# Patient Record
Sex: Male | Born: 1950 | Race: White | Hispanic: No | Marital: Married | Smoking: Former smoker
Health system: Southern US, Community
[De-identification: ages and names within clinical notes are randomized; demographics above are authoritative.]

## PROBLEM LIST (undated history)

## (undated) DIAGNOSIS — T7840XA Allergy, unspecified, initial encounter: Secondary | ICD-10-CM

## (undated) DIAGNOSIS — I219 Acute myocardial infarction, unspecified: Secondary | ICD-10-CM

## (undated) DIAGNOSIS — I10 Essential (primary) hypertension: Secondary | ICD-10-CM

## (undated) DIAGNOSIS — B019 Varicella without complication: Secondary | ICD-10-CM

## (undated) DIAGNOSIS — F329 Major depressive disorder, single episode, unspecified: Secondary | ICD-10-CM

## (undated) DIAGNOSIS — S060X0A Concussion without loss of consciousness, initial encounter: Secondary | ICD-10-CM

## (undated) DIAGNOSIS — E876 Hypokalemia: Secondary | ICD-10-CM

## (undated) DIAGNOSIS — Z8639 Personal history of other endocrine, nutritional and metabolic disease: Secondary | ICD-10-CM

## (undated) DIAGNOSIS — F32A Depression, unspecified: Secondary | ICD-10-CM

## (undated) DIAGNOSIS — E119 Type 2 diabetes mellitus without complications: Secondary | ICD-10-CM

## (undated) DIAGNOSIS — K219 Gastro-esophageal reflux disease without esophagitis: Secondary | ICD-10-CM

## (undated) DIAGNOSIS — K5190 Ulcerative colitis, unspecified, without complications: Secondary | ICD-10-CM

## (undated) DIAGNOSIS — E785 Hyperlipidemia, unspecified: Secondary | ICD-10-CM

## (undated) DIAGNOSIS — I509 Heart failure, unspecified: Secondary | ICD-10-CM

## (undated) DIAGNOSIS — D126 Benign neoplasm of colon, unspecified: Secondary | ICD-10-CM

## (undated) DIAGNOSIS — F419 Anxiety disorder, unspecified: Secondary | ICD-10-CM

## (undated) DIAGNOSIS — F509 Eating disorder, unspecified: Secondary | ICD-10-CM

## (undated) DIAGNOSIS — G510 Bell's palsy: Secondary | ICD-10-CM

## (undated) DIAGNOSIS — R413 Other amnesia: Secondary | ICD-10-CM

## (undated) HISTORY — DX: Acute myocardial infarction, unspecified: I21.9

## (undated) HISTORY — DX: Hypokalemia: E87.6

## (undated) HISTORY — DX: Benign neoplasm of colon, unspecified: D12.6

## (undated) HISTORY — PX: UPPER GASTROINTESTINAL ENDOSCOPY: SHX188

## (undated) HISTORY — DX: Varicella without complication: B01.9

## (undated) HISTORY — DX: Gastro-esophageal reflux disease without esophagitis: K21.9

## (undated) HISTORY — PX: SHOULDER SURGERY: SHX246

## (undated) HISTORY — DX: Hyperlipidemia, unspecified: E78.5

## (undated) HISTORY — DX: Allergy, unspecified, initial encounter: T78.40XA

## (undated) HISTORY — DX: Anxiety disorder, unspecified: F41.9

## (undated) HISTORY — DX: Major depressive disorder, single episode, unspecified: F32.9

## (undated) HISTORY — DX: Type 2 diabetes mellitus without complications: E11.9

## (undated) HISTORY — DX: Personal history of other endocrine, nutritional and metabolic disease: Z86.39

## (undated) HISTORY — DX: Other amnesia: R41.3

## (undated) HISTORY — DX: Bell's palsy: G51.0

## (undated) HISTORY — DX: Eating disorder, unspecified: F50.9

## (undated) HISTORY — DX: Depression, unspecified: F32.A

## (undated) HISTORY — DX: Essential (primary) hypertension: I10

## (undated) HISTORY — PX: TONSILLECTOMY: SUR1361

---
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
## 2000-09-27 ENCOUNTER — Other Ambulatory Visit: Admission: RE | Admit: 2000-09-27 | Discharge: 2000-09-27 | Payer: Self-pay | Admitting: Family Medicine

## 2004-03-31 ENCOUNTER — Ambulatory Visit (HOSPITAL_COMMUNITY): Admission: RE | Admit: 2004-03-31 | Discharge: 2004-03-31 | Payer: Self-pay | Admitting: Orthopedic Surgery

## 2004-11-21 ENCOUNTER — Encounter: Admission: RE | Admit: 2004-11-21 | Discharge: 2004-11-21 | Payer: Self-pay | Admitting: Family Medicine

## 2005-06-07 ENCOUNTER — Encounter: Admission: RE | Admit: 2005-06-07 | Discharge: 2005-06-07 | Payer: Self-pay | Admitting: Family Medicine

## 2005-11-26 ENCOUNTER — Encounter: Admission: RE | Admit: 2005-11-26 | Discharge: 2005-11-26 | Payer: Self-pay | Admitting: Family Medicine

## 2005-12-03 ENCOUNTER — Encounter: Admission: RE | Admit: 2005-12-03 | Discharge: 2005-12-03 | Payer: Self-pay | Admitting: Neurology

## 2006-05-06 ENCOUNTER — Inpatient Hospital Stay (HOSPITAL_COMMUNITY): Admission: AD | Admit: 2006-05-06 | Discharge: 2006-05-10 | Payer: Self-pay | Admitting: Gastroenterology

## 2006-05-08 ENCOUNTER — Encounter (INDEPENDENT_AMBULATORY_CARE_PROVIDER_SITE_OTHER): Payer: Self-pay | Admitting: *Deleted

## 2006-05-23 ENCOUNTER — Encounter (HOSPITAL_COMMUNITY): Admission: RE | Admit: 2006-05-23 | Discharge: 2006-08-21 | Payer: Self-pay | Admitting: Gastroenterology

## 2008-02-10 ENCOUNTER — Encounter (INDEPENDENT_AMBULATORY_CARE_PROVIDER_SITE_OTHER): Payer: Self-pay | Admitting: Gastroenterology

## 2008-02-10 ENCOUNTER — Ambulatory Visit (HOSPITAL_COMMUNITY): Admission: RE | Admit: 2008-02-10 | Discharge: 2008-02-10 | Payer: Self-pay | Admitting: Gastroenterology

## 2011-01-23 NOTE — Op Note (Signed)
[name], [name] NO.:  [id number]   MEDICAL RECORD NO.:  [id number]          PATIENT TYPE:  AMB   LOCATION:  ENDO                         FACILITY:  [facility]   PHYSICIAN:  Earle Gell, M.D.   DATE OF BIRTH:  10-16-1950   DATE OF PROCEDURE:  02/10/2008  DATE OF DISCHARGE:                               OPERATIVE REPORT   PROCEDURE INDICATION:  Mr. [name] is a 60 year old male born on  [birth year].  In 1983, Mr. [name] underwent a colonoscopy performed in  [place], [place] and was diagnosed with ulcerative colitis.   In August 2007, Mr. [name] was hospitalized at [facility] with a  flare in his universal ulcerative proctocolitis.  Unprepped  proctocolonoscopy to the proximal ascending colon revealed universal  ulcerative proctocolitis with relative sparing of the ascending colon  and cecum.   Mr. [name] is symptomatically in remission on azathioprine.   MEDICATION ALLERGIES:  None.   PAST MEDICAL HISTORY:  1. Hypertension.  2. Hypercholesterolemia.  3. Shoulder surgery.  4. Universal ulcerative proctocolitis diagnosed in 1983.  5. Bell palsy on 2 occasion, resolved sixth cranial nerve palsy.   HABITS:  Mr. [name] does not use tobacco products.  He consumes [employer] sparingly.   SOCIAL HISTORY:  Mr. [name] is married.  His wife and 2 sons are in good  health.   FAMILY HISTORY:  Negative for colon cancer.  His father died at age 41  in a boating accident.   ENDOSCOPIST:  Earle Gell, MD   PREMEDICATION:  Fentanyl 75 mcg and Versed 7 mg.   PROCEDURE:  After obtaining informed consent, Mr. [name] was placed in the  left lateral decubitus position.  I administered intravenous fentanyl  and intravenous Versed to achieve conscious sedation for the procedure.  The patient's blood pressure, oxygen saturation, and cardiac rhythm were  monitored throughout the procedure and documented in the medical record.   Anal inspection and  digital rectal exam were normal.  The Pentax  pediatric colonoscope was introduced into the rectum and easily advanced  to the cecum as identified by a normal-appearing ileocecal valve and  appendiceal orifice.  Colonic preparation for the exam today was  excellent.   Rectum normal.   Sigmoid colon and descending colon normal except for a few scattered  diminutive pseudopolyps.   Splenic flexure normal.   Transverse colon normal except for scattered diminutive pseudopolyps.   Hepatic flexure normal.   Ascending colon normal.   Cecum and ileocecal valve normal.   ASSESSMENT:  Universal ulcerative proctocolitis in remission on  azathioprine.  Thirty two biopsies were performed along the length of  the colon to rule out mucosal dysplasia.           ______________________________  Earle Gell, M.D.     MJ/MEDQ  D:  02/10/2008  T:  02/10/2008  Job:  [id number]   cc:   [name], M.D.

## 2011-01-26 NOTE — Discharge Summary (Signed)
[name], [name] NO.:  [id number]   MEDICAL RECORD NO.:  [id number]          PATIENT TYPE:  INP   LOCATION:  8676                         FACILITY:  [facility]   PHYSICIAN:  Earle Gell, M.D.   DATE OF BIRTH:  1951/06/11   DATE OF ADMISSION:  05/06/2006  DATE OF DISCHARGE:  05/10/2006                                 DISCHARGE SUMMARY   DISCHARGE DIAGNOSIS:  Indeterminate universal proctocolitis.   DISCHARGE MEDICATIONS:  1. Lovastatin 40 mg daily.  2. Aspirin 81 mg daily.  3. Multivitamin daily.  4. Potassium daily as prescribed.  5. Glucosamine daily p.r.n.  6. Fluoxetine 20 mg daily.  7. Prednisone 40 mg each morning.   I did not restart his antihypertensive therapy and I have not restarted his  Pentasa.   OFFICE FOLLOWUP:  I have asked [name] to make an appointment to see me in  1 week.   HOSPITAL COURSE:  [name] is a 60 year old male born 1950/09/30.  [name] was hospitalized at [facility] May 06, 2006, to  evaluate a 2-week history of nausea, vomiting and bloody diarrhea.   In 1983 he had a colonoscopy performed in [place], [place], and was told  he had ulcerative colitis.  He has not required chronic therapy for colitis  and has not required surgery for colitis.   [name] was admitted with hyponatremic, hypokalemic metabolic alkalosis  with profound volume contraction.  These metabolic abnormalities resolved  with vigorous intravenous saline infusion and potassium replacement.   Once his nausea and vomiting resolved he underwent an unprepped  proctocolonoscopy to the proximal ascending colon which revealed universal  ulcerative proctocolitis with relative sparing of the ascending colon and  what I could visualize of the cecum.  His C. difficile toxin screen was  negative.  Biopsies from the colon revealed acute and chronic colitis most  consistent with ulcerative colitis.  Endoscopic appearance of the colon  was  more consistent with Crohn's proctocolitis.   On admission [name] was placed on intravenous Solu-Medrol.  His gut  symptoms markedly improved.   His TB skin test was negative.  His chest x-ray was normal.  His hepatitis B  surface antigen was negative.  On May 10, 2006, he received intravenous  Remicade 500 mg per kilogram over 2 hours.  I evaluated [name] after a  little over an hour of infusion and he has had no side effects.   I did send blood to determine his thiopurine methyltransferase enzyme  activity and that value was not available at discharge.   [name] is tolerating a regular diet.  He is medically stable for  discharge.   I will assess his response to Remicade in 1 week.  I will plan to give him a  second infusion in 2 weeks and a third infusion in 8 weeks.   LABORATORY DATA:  White blood cell count 8600, hemoglobin 13.1 g.  Sedimentation rate 56 mm per hour.  Hyponatremic, hypokalemic metabolic  alkalosis resolved.  Basic metabolic profile normal at discharge.  Liver  enzymes, amylase and lipase normal.  Thyroid-stimulating hormone level and  free T4 normal.  Admission random serum cortisol was appropriately elevated  for stress.  Hepatitis B surface antigen negative.  C. difficile toxin  screen of the stool negative.  PA and lateral chest x-ray on admission  revealed low inspiratory lung volumes but no active disease.  Abdominal x-  ray on admission was consistent with left-sided colonic wall thickening  without toxic megacolon or intraperitoneal air.  TB skin test negative.  Hepatitis B surface antigen negative.           ______________________________  Earle Gell, M.D.     MJ/MEDQ  D:  05/10/2006  T:  05/11/2006  Job:  [id number]   cc:   Osvaldo Human, M.D.  Alyson Locket. Love, M.D.

## 2011-01-26 NOTE — H&P (Signed)
[name], [name] NO.:  [id number]   MEDICAL RECORD NO.:  [id number]          PATIENT TYPE:  INP   LOCATION:  4782                         FACILITY:  [facility]   PHYSICIAN:  Earle Gell, M.D.   DATE OF BIRTH:  [birth year]   DATE OF ADMISSION:  05/06/2006  DATE OF DISCHARGE:                                HISTORY & PHYSICAL   ADMISSION HISTORY AND PHYSICAL, FOLLOWED BY COLONOSCOPY:   ADMISSION DIAGNOSIS:  Nausea, vomiting and diarrhea for 2 weeks.   HISTORY:  [name] is a 60 year old male born Apr 03, 1951.  Mr.  Simien presented to my office with a 2-week history of nausea, vomiting and  bloody diarrhea associated with anorexia, a 15-pound weight loss,  generalized weakness and mild abdominal cramps.   Since March 2007, he has had two bouts of Bell's palsy.  The first episode  caused weakness on the right side of his face; the last episode weakened the  left side of his face, leaving him with residual paresis.  He has also  recovered from a sixth nerve palsy.  He has been evaluated by Alyson Locket. Love,  M.D., with a brain CT scan without contrast, brain MRI, and brain MRA.   In 45 [name] underwent a colonoscopy in [place], [place], and was  told he had colitis.  He has not been on medications for colitis and has not  required surgery for colitis.  His 2002 health maintenance flexible  proctosigmoidoscopy was reported to be normal.  His April 29, 2006, CBC and  liver profile were normal.   ALLERGIES:  None.   PAST MEDICAL HISTORY:  1. Hypertension.  2. Hypercholesterolemia.  3. Shoulder surgery.  4. Colitis by 1983 colonoscopy.  5. Bell palsy x2.  6. Resolved sixth cranial nerve palsy.   HABITS:  [name] does not use tobacco products.  He does consume 3 glasses  of Barnabas Lister Daniel's whiskey a month.   SOCIAL HISTORY:  Mr. Nile is married.  His 33 year old wife, 47 year old  son, and 19 year old son are in good health.   FAMILY  HISTORY:  Father died at age 13, boating accident.  Mother died at  age 72, Alzheimer's dementia.  Brother 18, in fair health.   CHRONIC MEDICATIONS:  1. Lovastatin 40 mg daily.  2. Atenolol with chlorthalidone 50/25 mg daily.  3. Fluoxetine 20 mg daily.  4. Aspirin 81 mg daily.  5. Potassium daily.  6. Glucosamine with chondroitin daily.  7. Multivitamin daily.  8. Meclizine 25 mg p.r.n.  9. Hyoscyamine sublingual 0.125 mg p.r.n.  10.Pentasa 500 mg q.i.d.   PHYSICAL EXAMINATION:  GENERAL APPEARANCE:  On presentation to my office Mr.  [name] appeared severely ill.  He was vomiting in a waiting room.  He could  hardly sit up and the exam was performed with the patient on his left side.  VITAL SIGNS:  Weight 249 pounds, blood pressure 132/60.  HEENT:  Nonicteric sclerae.  Extraocular movements are intact.  Very dry  oral mucosa.  LUNGS:  Clear to auscultation.  CARDIAC:  Regular  rhythm without murmurs.  ABDOMEN:  Soft and flat.  Bowel sounds are diminished.  He complains of  generalized abdominal muscular soreness secondary to retching.  SKIN:  Warm and dry.  I do not detect any rashes or any evidence to suggest  shingles.  EXTREMITIES:  No edema.  NEUROLOGIC:  There is residual left facial weakness.   ASSESSMENT:  1. Protracted nausea, vomiting and diarrhea leading to hypokalemic,      hyponatremic metabolic alkalosis with severe volume contraction.  2. Colitis by 1983 colonoscopy.  3. 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  4. Resolved sixth nerve palsy.  5. Recent prednisone use.   ENDOSCOPIST:  Earle Gell, M.D.   PREMEDICATION:  Fentanyl 75 mcg, Versed 7.5 mg.   PROCEDURE:  After obtaining informed consent, [name] was placed in the  left lateral decubitus position.  I administered intravenous fentanyl and  intravenous Versed to achieve conscious sedation for the procedure.  The  patient's blood pressure, oxygen saturation and cardiac rhythm were   monitored throughout the procedure and documented in the medical record.   Anal inspection and digital rectal exam were normal.  The Olympus adjustable  pediatric colonoscope was introduced into the rectum and advanced to the  distal ascending colon.  The ileocecal valve was seen in the distance.   [name] was not given a colonic lavage prep for this exam.  He did received  a tap water enema prior to undergoing the exam.  As a result, there was some  residual solid stool and liquid waste throughout the colon.  Despite this, I  was able to adequately visualize the colonic mucosa.   [name] has generalized very severe proctocolitis extending from the distal  rectum to the distal ascending colon.  Looking downstream at the ascending  colon and part of the cecum, the mucosa appeared relatively spared.   Throughout the mucosa there are deep ulcers with exudative bases.  The  mucosa is beefy red and friable.  There are no pseudomembranes to suggest C.  difficile colitis.  Biopsies are pending.   ASSESSMENT:  This looks like universal Crohn proctocolitis.           ______________________________  Earle Gell, M.D.     MJ/MEDQ  D:  05/08/2006  T:  05/08/2006  Job:  [id number]   cc:   Alyson Locket. Love, M.D.  Osvaldo Human, M.D.

## 2011-02-11 ENCOUNTER — Emergency Department (HOSPITAL_BASED_OUTPATIENT_CLINIC_OR_DEPARTMENT_OTHER)
Admission: EM | Admit: 2011-02-11 | Discharge: 2011-02-11 | Disposition: A | Discharge status: Home / Self Care | Payer: BC Managed Care – PPO | Attending: Emergency Medicine | Admitting: Emergency Medicine

## 2011-02-11 DIAGNOSIS — M545 Low back pain, unspecified: Secondary | ICD-10-CM | POA: Insufficient documentation

## 2012-02-25 ENCOUNTER — Emergency Department (HOSPITAL_BASED_OUTPATIENT_CLINIC_OR_DEPARTMENT_OTHER): Payer: BC Managed Care – PPO

## 2012-02-25 ENCOUNTER — Encounter (HOSPITAL_BASED_OUTPATIENT_CLINIC_OR_DEPARTMENT_OTHER): Payer: Self-pay | Admitting: Student

## 2012-02-25 ENCOUNTER — Emergency Department (HOSPITAL_BASED_OUTPATIENT_CLINIC_OR_DEPARTMENT_OTHER)
Admission: EM | Admit: 2012-02-25 | Discharge: 2012-02-25 | Disposition: A | Discharge status: Home / Self Care | Payer: BC Managed Care – PPO | Attending: Emergency Medicine | Admitting: Emergency Medicine

## 2012-02-25 DIAGNOSIS — W010XXA Fall on same level from slipping, tripping and stumbling without subsequent striking against object, initial encounter: Secondary | ICD-10-CM | POA: Insufficient documentation

## 2012-02-25 DIAGNOSIS — Y92009 Unspecified place in unspecified non-institutional (private) residence as the place of occurrence of the external cause: Secondary | ICD-10-CM | POA: Insufficient documentation

## 2012-02-25 DIAGNOSIS — Z87891 Personal history of nicotine dependence: Secondary | ICD-10-CM | POA: Insufficient documentation

## 2012-02-25 DIAGNOSIS — S82843A Displaced bimalleolar fracture of unspecified lower leg, initial encounter for closed fracture: Secondary | ICD-10-CM | POA: Insufficient documentation

## 2012-02-25 DIAGNOSIS — K5190 Ulcerative colitis, unspecified, without complications: Secondary | ICD-10-CM | POA: Insufficient documentation

## 2012-02-25 DIAGNOSIS — Y9301 Activity, walking, marching and hiking: Secondary | ICD-10-CM | POA: Insufficient documentation

## 2012-02-25 DIAGNOSIS — Y998 Other external cause status: Secondary | ICD-10-CM | POA: Insufficient documentation

## 2012-02-25 HISTORY — DX: Ulcerative colitis, unspecified, without complications: K51.90

## 2012-02-25 MED ORDER — OXYCODONE-ACETAMINOPHEN 5-325 MG PO TABS: 2.0000 | ORAL_TABLET | Freq: Four times a day (QID) | ORAL | Status: AC | PRN

## 2012-02-25 MED ORDER — OXYCODONE-ACETAMINOPHEN 5-325 MG PO TABS
2.0000 | ORAL_TABLET | Freq: Once | ORAL | Status: AC
  Administered 2012-02-25: 2 via ORAL
  Filled 2012-02-25: qty 2

## 2012-02-25 NOTE — ED Notes (Signed)
Pt in with injury to left ankle

## 2012-02-25 NOTE — ED Provider Notes (Signed)
History   This chart was scribed for [name] Johns, MD by Kathreen Cornfield. The patient was seen in room MH07/MH07 and the patient's care was started at 10:53 PM     CSN: [id number]  Arrival date & time 02/25/12  2133   First MD Initiated Contact with Patient 02/25/12 2233      Chief Complaint  Patient presents with  . Ankle Pain    slipping and twisting injury    (Consider location/radiation/quality/duration/timing/severity/associated sxs/prior treatment) HPI  [name] is a 61 y.o. male who presents to the Emergency Department complaining of moderate, episodic ankle pain located at the left ankle onset today with associated symptoms of numbness. The pt states he "slipped and fell while walking down a platform in his backyard." The pt states he "heard it pop about twice." Pt denies any other injury, denies neck or back pain.  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  Orthopedist is Dr. Veverly Fells in [place], [place].    Past Medical History  Diagnosis Date  . Ulcerative colitis     Past Surgical History  Procedure Date  . Shoulder surgery     LEFT      History  Substance Use Topics  . Smoking status: Former Research scientist (life sciences)  . Smokeless tobacco: Not on file  . Alcohol Use: Yes      Review of Systems  Constitutional: Negative for fever, chills, diaphoresis and fatigue.  HENT: Negative for congestion, rhinorrhea, sneezing and neck pain.   Eyes: Negative.   Respiratory: Negative for cough, chest tightness and shortness of breath.   Cardiovascular: Negative for chest pain and leg swelling.  Gastrointestinal: Negative for nausea, vomiting, abdominal pain, diarrhea and blood in stool.  Genitourinary: Negative for frequency, hematuria, flank pain and difficulty urinating.  Musculoskeletal: Negative for back pain and arthralgias.  Skin: Negative for rash.  Neurological: Negative for dizziness, speech difficulty, weakness, numbness and headaches.    Allergies  Review of  patient's allergies indicates no known allergies.  Home Medications   Current Outpatient Rx  Name Route Sig Dispense Refill  . OXYCODONE-ACETAMINOPHEN 5-325 MG PO TABS Oral Take 2 tablets by mouth every 6 (six) hours as needed for pain. 20 tablet 0    BP 158/78  Pulse 81  Temp 98.5 F (36.9 C) (Oral)  Resp 18  Ht 5' 7"  (1.702 m)  Wt 230 lb (104.327 kg)  BMI 36.02 kg/m2  SpO2 97%  Physical Exam  Nursing note and vitals reviewed. Constitutional: He is oriented to person, place, and time. He appears well-developed and well-nourished.  HENT:  Head: Normocephalic and atraumatic.  Nose: Nose normal.  Eyes: Pupils are equal, round, and reactive to light.  Neck: Normal range of motion. Neck supple.  Cardiovascular: Normal rate, regular rhythm and normal heart sounds.   Pulmonary/Chest: Effort normal and breath sounds normal.  Musculoskeletal: Normal range of motion. He exhibits no edema.       Moderate edema to the left ankle joint, positive tenderness along the bilateral malleoli, no pain to the proximal fibula, sensation intact, pulses intact. No pain along the neck or back.   Lymphadenopathy:    He has no cervical adenopathy.  Neurological: He is alert and oriented to person, place, and time.  Skin: Skin is warm and dry. No rash noted.  Psychiatric: He has a normal mood and affect.    ED Course  Procedures (including critical care time)  DIAGNOSTIC STUDIES: Oxygen Saturation is 97% on room  air, normal by my interpretation.    COORDINATION OF CARE:   10:55PM- EDP at bedside discusses treatment plan concerning x-ray results and follow up with Orthopedist.   Labs Reviewed - No data to display Dg Ankle Complete Left  02/25/2012  *RADIOLOGY REPORT*  Clinical Data: Twisting injury to the left ankle, status post fall. Left ankle pain.  LEFT ANKLE COMPLETE - 3+ VIEW  Comparison: Left lower leg radiographs performed 11/21/2004  Findings: There is a horizontal fracture through the  medial malleolus, and an oblique fracture through the distal fibula, both demonstrating mild displacement.  The interosseous space appears grossly intact.  No significant talar tilt or subluxation is seen, though both the medial and lateral malleolar fragments are laterally displaced.  Mild surrounding soft tissue swelling is noted.  The subtalar joint is unremarkable in appearance.  An os trigonum is seen.  A tiny plantar calcaneal spur is incidentally noted.  Visualized joint spaces are otherwise grossly intact.  IMPRESSION:  1.  Horizontal fracture through the medial malleolus, and oblique fracture through the distal fibula, both demonstrating mild lateral displacement.  The interosseous space appears grossly intact.  No significant talar tilt or subluxation seen. 2.  Os trigonum noted.  Original Report Authenticated By: Santa Lighter, M.D.     1. Ankle fracture, bimalleolar, closed       MDM  Pt placed in posterior splint, crutches, pain meds given.  Has ortho to f/u with.     I personally performed the services described in this documentation, which was scribed in my presence.  The recorded information has been reviewed and considered.      [name] Johns, MD 02/25/12 2312

## 2012-02-25 NOTE — Discharge Instructions (Signed)
Ankle Fracture A fracture is a break in the bone. A cast or splint is used to protect and keep your injured bone from moving.  HOME CARE INSTRUCTIONS   Use your crutches as directed.   To lessen the swelling, keep the injured leg elevated while sitting or lying down.   Apply ice to the injury for 15 to 20 minutes, 3 to 4 times per day while awake for 2 days. Put the ice in a plastic bag and place a thin towel between the bag of ice and your cast.   If you have a plaster or fiberglass cast:   Do not try to scratch the skin under the cast using sharp or pointed objects.   Check the skin around the cast every day. You may put lotion on any red or sore areas.   Keep your cast dry and clean.   If you have a plaster splint:   Wear the splint as directed.   You may loosen the elastic around the splint if your toes become numb, tingle, or turn cold or blue.   Do not put pressure on any part of your cast or splint; it may break. Rest your cast only on a pillow the first 24 hours until it is fully hardened.   Your cast or splint can be protected during bathing with a plastic bag. Do not lower the cast or splint into water.   Take medications as directed by your caregiver. Only take over-the-counter or prescription medicines for pain, discomfort, or fever as directed by your caregiver.   Do not drive a vehicle until your caregiver specifically tells you it is safe to do so.   If your caregiver has given you a follow-up appointment, it is very important to keep that appointment. Not keeping the appointment could result in a chronic or permanent injury, pain, and disability. If there is any problem keeping the appointment, you must call back to this facility for assistance.  SEEK IMMEDIATE MEDICAL CARE IF:   Your cast gets damaged or breaks.   You have continued severe pain or more swelling than you did before the cast was put on.   Your skin or toenails below the injury turn blue or gray,  or feel cold or numb.   There is a bad smell or new stains and/or purulent (pus like) drainage coming from under the cast.  If you do not have a window in your cast for observing the wound, a discharge or minor bleeding may show up as a stain on the outside of your cast. Report these findings to your caregiver. MAKE SURE YOU:   Understand these instructions.   Will watch your condition.   Will get help right away if you are not doing well or get worse.  Document Released: 08/24/2000 Document Revised: 08/16/2011 Document Reviewed: 03/30/2008 [facility] Patient Information 2012 [place].

## 2012-02-26 ENCOUNTER — Encounter (HOSPITAL_BASED_OUTPATIENT_CLINIC_OR_DEPARTMENT_OTHER): Payer: Self-pay | Admitting: *Deleted

## 2012-02-26 NOTE — Progress Notes (Signed)
No labs needed To arrive 1315

## 2012-02-27 ENCOUNTER — Ambulatory Visit (HOSPITAL_BASED_OUTPATIENT_CLINIC_OR_DEPARTMENT_OTHER)
Admission: RE | Admit: 2012-02-27 | Discharge: 2012-02-27 | Disposition: A | Discharge status: Home / Self Care | Payer: BC Managed Care – PPO | Source: Ambulatory Visit | Attending: Orthopedic Surgery | Admitting: Orthopedic Surgery

## 2012-02-27 ENCOUNTER — Encounter (HOSPITAL_BASED_OUTPATIENT_CLINIC_OR_DEPARTMENT_OTHER)
Admission: RE | Disposition: A | Discharge status: Home / Self Care | Payer: Self-pay | Source: Ambulatory Visit | Attending: Orthopedic Surgery

## 2012-02-27 ENCOUNTER — Encounter (HOSPITAL_BASED_OUTPATIENT_CLINIC_OR_DEPARTMENT_OTHER): Payer: Self-pay | Admitting: Certified Registered"

## 2012-02-27 ENCOUNTER — Ambulatory Visit (HOSPITAL_BASED_OUTPATIENT_CLINIC_OR_DEPARTMENT_OTHER): Payer: BC Managed Care – PPO | Admitting: Certified Registered"

## 2012-02-27 ENCOUNTER — Encounter (HOSPITAL_BASED_OUTPATIENT_CLINIC_OR_DEPARTMENT_OTHER): Payer: Self-pay

## 2012-02-27 DIAGNOSIS — S82843A Displaced bimalleolar fracture of unspecified lower leg, initial encounter for closed fracture: Secondary | ICD-10-CM | POA: Insufficient documentation

## 2012-02-27 DIAGNOSIS — M25579 Pain in unspecified ankle and joints of unspecified foot: Secondary | ICD-10-CM

## 2012-02-27 DIAGNOSIS — Y92009 Unspecified place in unspecified non-institutional (private) residence as the place of occurrence of the external cause: Secondary | ICD-10-CM | POA: Insufficient documentation

## 2012-02-27 DIAGNOSIS — W010XXA Fall on same level from slipping, tripping and stumbling without subsequent striking against object, initial encounter: Secondary | ICD-10-CM | POA: Insufficient documentation

## 2012-02-27 HISTORY — PX: ORIF ANKLE FRACTURE: SHX5408

## 2012-02-27 SURGERY — OPEN REDUCTION INTERNAL FIXATION (ORIF) ANKLE FRACTURE
Anesthesia: General | Site: Ankle | Laterality: Left | Wound class: Clean

## 2012-02-27 MED ORDER — ASPIRIN EC 325 MG PO TBEC: 325.0000 mg | DELAYED_RELEASE_TABLET | Freq: Two times a day (BID) | ORAL | Status: AC

## 2012-02-27 MED ORDER — MIDAZOLAM HCL 2 MG/2ML IJ SOLN
0.5000 mg | INTRAMUSCULAR | Status: DC | PRN
  Administered 2012-02-27: 2 mg via INTRAVENOUS

## 2012-02-27 MED ORDER — FENTANYL CITRATE 0.05 MG/ML IJ SOLN
50.0000 ug | INTRAMUSCULAR | Status: DC | PRN
  Administered 2012-02-27: 100 ug via INTRAVENOUS

## 2012-02-27 MED ORDER — BUPIVACAINE-EPINEPHRINE PF 0.5-1:200000 % IJ SOLN
INTRAMUSCULAR | Status: DC | PRN
  Administered 2012-02-27: 30 mL

## 2012-02-27 MED ORDER — VITAMIN C 500 MG PO TABS: 500.0000 mg | ORAL_TABLET | Freq: Every day | ORAL | Status: AC

## 2012-02-27 MED ORDER — EPHEDRINE SULFATE 50 MG/ML IJ SOLN
INTRAMUSCULAR | Status: DC | PRN
  Administered 2012-02-27: 10 mg via INTRAVENOUS

## 2012-02-27 MED ORDER — DEXAMETHASONE SODIUM PHOSPHATE 4 MG/ML IJ SOLN
INTRAMUSCULAR | Status: DC | PRN
  Administered 2012-02-27: 8 mg via INTRAVENOUS

## 2012-02-27 MED ORDER — CEFAZOLIN SODIUM-DEXTROSE 2-3 GM-% IV SOLR
2.0000 g | INTRAVENOUS | Status: AC
  Administered 2012-02-27: 2 g via INTRAVENOUS

## 2012-02-27 MED ORDER — CEFAZOLIN SODIUM 1-5 GM-% IV SOLN: 1.0000 g | INTRAVENOUS | Status: DC

## 2012-02-27 MED ORDER — PROPOFOL 10 MG/ML IV EMUL
INTRAVENOUS | Status: DC | PRN
  Administered 2012-02-27: 200 mg via INTRAVENOUS

## 2012-02-27 MED ORDER — LACTATED RINGERS IV SOLN
INTRAVENOUS | Status: DC
  Administered 2012-02-27 (×2): via INTRAVENOUS

## 2012-02-27 MED ORDER — ONDANSETRON HCL 4 MG/2ML IJ SOLN
INTRAMUSCULAR | Status: DC | PRN
  Administered 2012-02-27: 4 mg via INTRAVENOUS

## 2012-02-27 MED ORDER — BUPIVACAINE HCL (PF) 0.5 % IJ SOLN
INTRAMUSCULAR | Status: DC | PRN
  Administered 2012-02-27: 20 mL

## 2012-02-27 MED ORDER — CHLORHEXIDINE GLUCONATE 4 % EX LIQD: 60.0000 mL | Freq: Once | CUTANEOUS | Status: DC

## 2012-02-27 MED ORDER — METHOCARBAMOL 500 MG PO TABS: 500.0000 mg | ORAL_TABLET | Freq: Three times a day (TID) | ORAL | Status: AC

## 2012-02-27 MED ORDER — LIDOCAINE HCL (CARDIAC) 20 MG/ML IV SOLN
INTRAVENOUS | Status: DC | PRN
  Administered 2012-02-27: 40 mg via INTRAVENOUS

## 2012-02-27 MED ORDER — SODIUM CHLORIDE 0.9 % IV SOLN: INTRAVENOUS | Status: DC

## 2012-02-27 SURGICAL SUPPLY — 70 items
BANDAGE ELASTIC 4 VELCRO ST LF (GAUZE/BANDAGES/DRESSINGS) ×1 IMPLANT
BANDAGE ELASTIC 6 VELCRO ST LF (GAUZE/BANDAGES/DRESSINGS) ×2 IMPLANT
BIT DRILL SOLID 2.5X110 (BIT) ×1 IMPLANT
BIT DRILL SOLID 3.5X110 (BIT) ×1 IMPLANT
BLADE SURG 15 STRL LF DISP TIS (BLADE) ×4 IMPLANT
BLADE SURG 15 STRL SS (BLADE) ×8
BRUSH SCRUB EZ PLAIN DRY (MISCELLANEOUS) ×2 IMPLANT
BUR EGG 3PK/BX (BURR) IMPLANT
CLOTH BEACON ORANGE TIMEOUT ST (SAFETY) ×2 IMPLANT
COTTON STERILE ROLL (GAUZE/BANDAGES/DRESSINGS) ×2 IMPLANT
COVER TABLE BACK 60X90 (DRAPES) ×2 IMPLANT
CUFF TOURNIQUET SINGLE 34IN LL (TOURNIQUET CUFF) ×2 IMPLANT
DRAPE EXTREMITY T 121X128X90 (DRAPE) ×2 IMPLANT
DRAPE OEC MINIVIEW 54X84 (DRAPES) ×1 IMPLANT
DRAPE SURG 17X23 STRL (DRAPES) ×2 IMPLANT
DRSG PAD ABDOMINAL 8X10 ST (GAUZE/BANDAGES/DRESSINGS) ×2 IMPLANT
DURA STEPPER LG (CAST SUPPLIES) IMPLANT
DURA STEPPER MED (CAST SUPPLIES) IMPLANT
ELECT REM PT RETURN 9FT ADLT (ELECTROSURGICAL) ×2
ELECTRODE REM PT RTRN 9FT ADLT (ELECTROSURGICAL) ×1 IMPLANT
GAUZE SPONGE 4X4 16PLY XRAY LF (GAUZE/BANDAGES/DRESSINGS) IMPLANT
GAUZE XEROFORM 1X8 LF (GAUZE/BANDAGES/DRESSINGS) ×2 IMPLANT
GAUZE XEROFORM 5X9 LF (GAUZE/BANDAGES/DRESSINGS) IMPLANT
GLOVE BIO SURGEON STRL SZ8 (GLOVE) ×2 IMPLANT
GLOVE BIOGEL M STRL SZ7.5 (GLOVE) ×1 IMPLANT
GLOVE BIOGEL PI IND STRL 7.0 (GLOVE) IMPLANT
GLOVE BIOGEL PI IND STRL 8 (GLOVE) ×2 IMPLANT
GLOVE BIOGEL PI INDICATOR 7.0 (GLOVE) ×1
GLOVE BIOGEL PI INDICATOR 8 (GLOVE) ×3
GLOVE SURG SS PI 8.0 STRL IVOR (GLOVE) ×2 IMPLANT
GOWN BRE IMP PREV XXLGXLNG (GOWN DISPOSABLE) ×3 IMPLANT
GOWN PREVENTION PLUS XLARGE (GOWN DISPOSABLE) ×2 IMPLANT
KIT 1/3 TUB PL 6H 73M (Orthopedic Implant) ×1 IMPLANT
NEEDLE HYPO 22GX1.5 SAFETY (NEEDLE) IMPLANT
NS IRRIG 1000ML POUR BTL (IV SOLUTION) ×2 IMPLANT
PACK BASIN DAY SURGERY FS (CUSTOM PROCEDURE TRAY) ×2 IMPLANT
PAD CAST 4YDX4 CTTN HI CHSV (CAST SUPPLIES) ×1 IMPLANT
PADDING CAST ABS 4INX4YD NS (CAST SUPPLIES) ×1
PADDING CAST ABS COTTON 4X4 ST (CAST SUPPLIES) ×1 IMPLANT
PADDING CAST COTTON 4X4 STRL (CAST SUPPLIES) ×2
PENCIL BUTTON HOLSTER BLD 10FT (ELECTRODE) ×2 IMPLANT
PROS 1/3 TUB PL 6H 73M (Orthopedic Implant) ×2 IMPLANT
SCREW CORTEX 3.5 16MM (Screw) ×3 IMPLANT
SCREW CORTEX 3.5 18MM (Screw) ×1 IMPLANT
SCREW CORTEX 3.5 26MM (Screw) ×1 IMPLANT
SCREW CORTEX 3.5 36MM (Screw) ×2 IMPLANT
SCREW LOCK CORT ST 3.5X16 (Screw) IMPLANT
SCREW LOCK CORT ST 3.5X18 (Screw) IMPLANT
SCREW LOCK CORT ST 3.5X26 (Screw) IMPLANT
SCREW LOCK CORT ST 3.5X36 (Screw) IMPLANT
SHEET MEDIUM DRAPE 40X70 STRL (DRAPES) ×4 IMPLANT
SLEEVE SCD COMPRESS KNEE MED (MISCELLANEOUS) ×1 IMPLANT
SPLINT FAST PLASTER 5X30 (CAST SUPPLIES) ×20
SPLINT PLASTER CAST FAST 5X30 (CAST SUPPLIES) IMPLANT
SPONGE GAUZE 4X4 12PLY (GAUZE/BANDAGES/DRESSINGS) ×2 IMPLANT
STOCKINETTE 6  STRL (DRAPES) ×1
STOCKINETTE 6 STRL (DRAPES) ×1 IMPLANT
SUCTION FRAZIER TIP 10 FR DISP (SUCTIONS) ×1 IMPLANT
SUT ETHILON 3 0 PS 1 (SUTURE) IMPLANT
SUT ETHILON 4 0 PS 2 18 (SUTURE) ×5 IMPLANT
SUT VIC AB 2-0 SH 27 (SUTURE)
SUT VIC AB 2-0 SH 27XBRD (SUTURE) IMPLANT
SUT VIC AB 3-0 PS1 18 (SUTURE) ×4
SUT VIC AB 3-0 PS1 18XBRD (SUTURE) ×1 IMPLANT
SYR BULB 3OZ (MISCELLANEOUS) ×2 IMPLANT
TOWEL OR 17X24 6PK STRL BLUE (TOWEL DISPOSABLE) ×6 IMPLANT
TOWEL OR NON WOVEN STRL DISP B (DISPOSABLE) ×2 IMPLANT
TUBE CONNECTING 20X1/4 (TUBING) ×4 IMPLANT
UNDERPAD 30X30 INCONTINENT (UNDERPADS AND DIAPERS) ×2 IMPLANT
WATER STERILE IRR 1000ML POUR (IV SOLUTION) ×2 IMPLANT

## 2012-02-27 NOTE — Transfer of Care (Signed)
Immediate Anesthesia Transfer of Care Note  Patient: [name]  Procedure(s) Performed: Procedure(s) (LRB): OPEN REDUCTION INTERNAL FIXATION (ORIF) ANKLE FRACTURE (Left)  Patient Location: PACU  Anesthesia Type: GA combined with regional for post-op pain  Level of Consciousness: awake, alert , oriented and patient cooperative  Airway & Oxygen Therapy: Patient Spontanous Breathing and Patient connected to face mask oxygen  Post-op Assessment: Report given to PACU RN and Post -op Vital signs reviewed and stable  Post vital signs: Reviewed and stable  Complications: No apparent anesthesia complications

## 2012-02-27 NOTE — Brief Op Note (Signed)
02/27/2012  4:02 PM  PATIENT:  [name]  61 y.o. male  PRE-OPERATIVE DIAGNOSIS:  bimalleolar left ankle fracture  POST-OPERATIVE DIAGNOSIS:  Bimalleolar Fracture Left Ankle Fracture  PROCEDURE:  Procedure(s) (LRB): OPEN REDUCTION INTERNAL FIXATION (ORIF) ANKLE FRACTURE (Left)  SURGEON:  Surgeon(s) and Role:    * Colin Rhein, MD - Primary  PHYSICIAN ASSISTANT: Benita Stabile, PAC   ASSISTANTS: Above   ANESTHESIA:   general  EBL:  Total I/O In: 1000 [I.V.:1000] Out: -   BLOOD ADMINISTERED:none  DRAINS: none   LOCAL MEDICATIONS USED:  NONE  SPECIMEN:  No Specimen  DISPOSITION OF SPECIMEN:  N/A  COUNTS:  YES  TOURNIQUET:   Total Tourniquet Time Documented: Thigh (Left) - 60 minutes  DICTATION: .Other Dictation: Dictation Number [id number]  PLAN OF CARE: Discharge to home after PACU  PATIENT DISPOSITION:  PACU - hemodynamically stable.   Delay start of Pharmacological VTE agent (>24hrs) due to surgical blood loss or risk of bleeding: no

## 2012-02-27 NOTE — Anesthesia Procedure Notes (Addendum)
Anesthesia Regional Block:  Popliteal block  Pre-Anesthetic Checklist: ,, timeout performed, Correct Patient, Correct Site, Correct Laterality, Correct Procedure, Correct Position, site marked, Risks and benefits discussed, pre-op evaluation, post-op pain management  Laterality: Left  Prep: Maximum Sterile Barrier Precautions used and chloraprep       Needles:  Injection technique: Single-shot  Needle Type: Echogenic Stimulator Needle          Additional Needles:  Procedures: ultrasound guided and nerve stimulator Popliteal block  Nerve Stimulator or Paresthesia:  Response: Peroneal,  Response: Tibial,   Additional Responses:   Narrative:  Start time: 02/27/2012 2:02 PM End time: 02/27/2012 2:30 PM Injection made incrementally with aspirations every 5 mL. Anesthesiologist: Ola Spurr, MD  Additional Notes: 2% Lidocaine skin wheel. Saphenous block with 10cc of 0.5% Bupivicain plain.  Popliteal block Procedure Name: LMA Insertion Date/Time: 02/27/2012 2:44 PM Performed by: Denny Levy Pre-anesthesia Checklist: Patient identified, Emergency Drugs available, Suction available, Patient being monitored and Timeout performed Patient Re-evaluated:Patient Re-evaluated prior to inductionOxygen Delivery Method: Circle System Utilized Preoxygenation: Pre-oxygenation with 100% oxygen Intubation Type: IV induction Ventilation: Mask ventilation without difficulty LMA: LMA with gastric port inserted LMA Size: 5.0 Number of attempts: 1 Placement Confirmation: positive ETCO2 Tube secured with: Tape Dental Injury: Teeth and Oropharynx as per pre-operative assessment     Anesthesia Regional Block:   Narrative:

## 2012-02-27 NOTE — Anesthesia Preprocedure Evaluation (Signed)
Anesthesia Evaluation  Patient identified by MRN, date of birth, ID band Patient awake    Reviewed: Allergy & Precautions, H&P , NPO status , Patient's Chart, lab work & pertinent test results  Airway Mallampati: II TM Distance: >3 FB Neck ROM: Full    Dental No notable dental hx. (+) Teeth Intact and Dental Advisory Given   Pulmonary neg pulmonary ROS,  breath sounds clear to auscultation  Pulmonary exam normal       Cardiovascular negative cardio ROS  Rhythm:Regular Rate:Normal     Neuro/Psych negative neurological ROS  negative psych ROS   GI/Hepatic negative GI ROS, Neg liver ROS, PUD,   Endo/Other  negative endocrine ROS  Renal/GU negative Renal ROS  negative genitourinary   Musculoskeletal   Abdominal   Peds  Hematology negative hematology ROS (+)   Anesthesia Other Findings   Reproductive/Obstetrics negative OB ROS                           Anesthesia Physical Anesthesia Plan  ASA: II  Anesthesia Plan: General   Post-op Pain Management:    Induction: Intravenous  Airway Management Planned: LMA  Additional Equipment:   Intra-op Plan:   Post-operative Plan: Extubation in OR  Informed Consent: I have reviewed the patients History and Physical, chart, labs and discussed the procedure including the risks, benefits and alternatives for the proposed anesthesia with the patient or authorized representative who has indicated his/her understanding and acceptance.   Dental advisory given  Plan Discussed with: CRNA  Anesthesia Plan Comments:         Anesthesia Quick Evaluation

## 2012-02-27 NOTE — Anesthesia Postprocedure Evaluation (Signed)
  Anesthesia Post-op Note  Patient: [name]  Procedure(s) Performed: Procedure(s) (LRB): OPEN REDUCTION INTERNAL FIXATION (ORIF) ANKLE FRACTURE (Left)  Patient Location: PACU  Anesthesia Type: GA combined with regional for post-op pain  Level of Consciousness: awake, alert  and oriented  Airway and Oxygen Therapy: Patient Spontanous Breathing  Post-op Pain: none  Post-op Assessment: Post-op Vital signs reviewed, Patient's Cardiovascular Status Stable, Respiratory Function Stable, Patent Airway and No signs of Nausea or vomiting  Post-op Vital Signs: Reviewed and stable  Complications: No apparent anesthesia complications

## 2012-02-27 NOTE — H&P (Signed)
  H&P documentation: Placed to be scanned history and physical exam in chart.  -History and Physical Reviewed  -Patient has been re-examined  -No change in the plan of care  Januel Doolan A

## 2012-02-27 NOTE — Progress Notes (Signed)
Assisted Dr. Ola Spurr with left, ultrasound guided, popliteal/saphenous block. Side rails up, monitors on throughout procedure. See vital signs in flow sheet. Tolerated Procedure well.

## 2012-02-27 NOTE — Discharge Instructions (Signed)
  Post Anesthesia Home Care Instructions  Activity: Get plenty of rest for the remainder of the day. A responsible adult should stay with you for 24 hours following the procedure.  For the next 24 hours, DO NOT: -Drive a car -Paediatric nurse -Drink alcoholic beverages -Take any medication unless instructed by your physician -Make any legal decisions or sign important papers.  Meals: Start with liquid foods such as gelatin or soup. Progress to regular foods as tolerated. Avoid greasy, spicy, heavy foods. If nausea and/or vomiting occur, drink only clear liquids until the nausea and/or vomiting subsides. Call your physician if vomiting continues.  Special Instructions/Symptoms: Your throat may feel dry or sore from the anesthesia or the breathing tube placed in your throat during surgery. If this causes discomfort, gargle with warm salt water. The discomfort should disappear within 24 hours.     Regional Anesthesia Blocks  1. Numbness or the inability to move the "blocked" extremity may last from 3-48 hours after placement. The length of time depends on the medication injected and your individual response to the medication. If the numbness is not going away after 48 hours, call your surgeon.  2. The extremity that is blocked will need to be protected until the numbness is gone and the  Strength has returned. Because you cannot feel it, you will need to take extra care to avoid injury. Because it may be weak, you may have difficulty moving it or using it. You may not know what position it is in without looking at it while the block is in effect.  3. For blocks in the legs and feet, returning to weight bearing and walking needs to be done carefully. You will need to wait until the numbness is entirely gone and the strength has returned. You should be able to move your leg and foot normally before you try and bear weight or walk. You will need someone to be with you when you first try to ensure  you do not fall and possibly risk injury.  4. Bruising and tenderness at the needle site are common side effects and will resolve in a few days.  5. Persistent numbness or new problems with movement should be communicated to the surgeon or the [facility] [phone] [place] [phone]).

## 2012-02-28 ENCOUNTER — Encounter (HOSPITAL_BASED_OUTPATIENT_CLINIC_OR_DEPARTMENT_OTHER): Payer: Self-pay | Admitting: Orthopedic Surgery

## 2012-02-28 NOTE — Op Note (Signed)
NAMETRIGO, WINTERBOTTOM NO.:  [id number]  MEDICAL RECORD NO.:  [id number]  LOCATION:                                 FACILITY:  PHYSICIAN:  Weber Cooks, M.D.     DATE OF BIRTH:  03/12/1951  DATE OF PROCEDURE:  02/27/2012 DATE OF DISCHARGE:                              OPERATIVE REPORT   PREOPERATIVE DIAGNOSIS:  Left bimalleolar ankle fracture.  POSTOPERATIVE DIAGNOSIS:  Left bimalleolar ankle fracture.  OPERATION: 1. Open reduction and internal fixation of left bimalleolar ankle     fracture. 2. Stress x-rays of the left ankle.  ANESTHESIA:  General  SURGEON:  Weber Cooks, MD.  ASSISTANT:  Erskine Emery, PA-C.  ESTIMATED BLOOD LOSS:  Minimal.  TOURNIQUET TIME:  Approximately 1 hour.  COMPLICATIONS:  None.  DISPOSITION:  Stable to PR.  INDICATION:  This is a 61 year old gentleman who sustained the above injury.  He was consented the above procedure.  All risks, infection, neuro or vessel injury, nonunion, malunion, hardware irritation, hardware failure, persistent pain, worsening pain, prolonged recovery, stiffness, arthritis, wound healing problems, DVT, PE, were all explained.  Questions were encouraged and answered.  OPERATION:  The patient was brought to the operating and placed in supine position after adequate general anesthesia administered as well as Ancef 1 g IV piggyback.  The bumps placed on left ipsilateral hip internally rotating left lower extremity.  All bony prominence well padded.  Left lower extremity was prepped and draped in sterile manner over proximally thigh tourniquet.  Limb was gravity exsanguinated. Tourniquet elevated to 290 mmHg.  A longitudinal incision midline over the lateral aspect of the left lateral malleolus then made.  Dissection was carried down through skin.  Hemostasis was obtained.  Fracture was then encountered.  Hematoma as well as periosteum from the fracture site was removed that was 2-point reduction  clamp, this was reduced anatomically.  We then placed a 3.5-mm fully-threaded cortical lag screw using a 3.5 and 2.5 mm drill hole respectively.  This had excellent purchase and maintenance of the desired position.  We then placed a 5- hole 1/3 tubular plate by [employer] on the lateral aspect of the lateral malleolus.  Once this was applied, we then placed 3.5 mm fully-threaded cortical set screw using a 2.5-mm drill hole respectively.  This had excellent purchase and maintenance of the desired position.  We then placed 1 screw distally into the distal fragment, this was a 3.5 mm fully-threaded cortical set screw using a 2.5 mm drill hole respectively, again this had excellent purchase and maintenance of the desired position.  We then obtained stress x-rays in AP, lateral, and mortise views, showed no gross motion fixation, proper position, and excellent alignment as well.  We then made a longitudinal incision on the medial aspect of the ankle centered over the medial malleolus. Dissection was carried down through skin.  Hemostasis was obtained. Fracture site was then encountered, fracture site was then opened and the joint was entered.  This was copiously irrigated with normal saline and any fragments that could be visualized were removed.  Once the fracture site was freshened up by removing any periosteum from the  fracture site or soft tissue from the fracture site, we then anatomically reduced the fracture.  Posterior tib was also visualized and had no obvious tear.  Once the fracture site was anatomically reduced, with a two-point reduction clamp, we then placed a 3.5-mm fully- threaded cortical lag screw using a 3.5 and 2.5 mm drill hole respectively.  This had excellent purchase and maintenance of the desired position.  We then placed a another screw slightly divergent to the first screw, again this was a 3.5 mm fully-threaded cortical lag screw, using a 3.5 and 2.5 mm drill hole  respectively.  This had excellent purchase and maintenance of the desired position.  We then obtained stress x-rays of AP, lateral, and mortise views, showed no gross motion, fixation, proper position, and excellent alignment as well.  The tourniquet was deflated.  Hemostasis was obtained.  The wounds were copiously irrigated with normal saline.  The subcu was closed with 3-0 Vicryl, skin was closed with 4-0 nylon.  Sterile dressing was applied.  Modified Jones dressing was applied with ankle in dorsiflexion.  The patient was stable to PR.     Weber Cooks, M.D.     PB/MEDQ  D:  02/27/2012  T:  02/27/2012  Job:  [id number]

## 2014-06-02 ENCOUNTER — Other Ambulatory Visit: Payer: Self-pay | Admitting: Gastroenterology

## 2014-06-02 DIAGNOSIS — Z87891 Personal history of nicotine dependence: Secondary | ICD-10-CM

## 2014-06-02 DIAGNOSIS — Z0000 Encounter for general adult medical examination without abnormal findings: Secondary | ICD-10-CM

## 2014-06-11 ENCOUNTER — Ambulatory Visit
Admission: RE | Admit: 2014-06-11 | Discharge: 2014-06-11 | Disposition: A | Discharge status: Home / Self Care | Payer: Commercial Managed Care - HMO | Source: Ambulatory Visit | Attending: Gastroenterology | Admitting: Gastroenterology

## 2014-06-11 DIAGNOSIS — Z87891 Personal history of nicotine dependence: Secondary | ICD-10-CM

## 2014-06-11 DIAGNOSIS — Z0000 Encounter for general adult medical examination without abnormal findings: Secondary | ICD-10-CM

## 2014-06-24 ENCOUNTER — Encounter (HOSPITAL_COMMUNITY): Payer: Self-pay | Admitting: Pharmacy Technician

## 2014-07-13 NOTE — Anesthesia Preprocedure Evaluation (Addendum)
Anesthesia Evaluation  Patient identified by MRN, date of birth, ID band Patient awake    Reviewed: Allergy & Precautions, H&P , NPO status , Patient's Chart, lab work & pertinent test results  History of Anesthesia Complications Negative for: history of anesthetic complications  Airway Mallampati: II  TM Distance: >3 FB Neck ROM: Full    Dental  (+) Upper Dentures, Lower Dentures, Dental Advisory Given   Pulmonary former smoker,  breath sounds clear to auscultation  Pulmonary exam normal       Cardiovascular Exercise Tolerance: Good negative cardio ROS  Rhythm:Regular Rate:Normal     Neuro/Psych negative neurological ROS  negative psych ROS   GI/Hepatic Neg liver ROS, PUD, Ulcerative colitis   Endo/Other  negative endocrine ROS  Renal/GU negative Renal ROS  negative genitourinary   Musculoskeletal negative musculoskeletal ROS (+)   Abdominal   Peds negative pediatric ROS (+)  Hematology negative hematology ROS (+)   Anesthesia Other Findings   Reproductive/Obstetrics negative OB ROS                            Anesthesia Physical Anesthesia Plan  ASA: II  Anesthesia Plan: MAC   Post-op Pain Management:    Induction: Intravenous  Airway Management Planned: Nasal Cannula  Additional Equipment:   Intra-op Plan:   Post-operative Plan: Extubation in OR  Informed Consent: I have reviewed the patients History and Physical, chart, labs and discussed the procedure including the risks, benefits and alternatives for the proposed anesthesia with the patient or authorized representative who has indicated his/her understanding and acceptance.   Dental advisory given  Plan Discussed with: CRNA  Anesthesia Plan Comments:         Anesthesia Quick Evaluation

## 2014-07-15 ENCOUNTER — Encounter (HOSPITAL_COMMUNITY)
Admission: RE | Disposition: A | Discharge status: Home / Self Care | Payer: Self-pay | Source: Ambulatory Visit | Attending: Gastroenterology

## 2014-07-15 ENCOUNTER — Ambulatory Visit (HOSPITAL_COMMUNITY)
Admission: RE | Admit: 2014-07-15 | Discharge: 2014-07-15 | Disposition: A | Discharge status: Home / Self Care | Payer: Commercial Managed Care - HMO | Source: Ambulatory Visit | Attending: Gastroenterology | Admitting: Gastroenterology

## 2014-07-15 ENCOUNTER — Encounter (HOSPITAL_COMMUNITY)
Admission: RE | Disposition: A | Discharge status: Home / Self Care | Payer: Commercial Managed Care - HMO | Source: Ambulatory Visit | Attending: Gastroenterology

## 2014-07-15 ENCOUNTER — Ambulatory Visit (HOSPITAL_COMMUNITY): Payer: Commercial Managed Care - HMO | Admitting: Anesthesiology

## 2014-07-15 ENCOUNTER — Encounter (HOSPITAL_COMMUNITY): Payer: Self-pay

## 2014-07-15 DIAGNOSIS — K5130 Ulcerative (chronic) rectosigmoiditis without complications: Secondary | ICD-10-CM | POA: Diagnosis present

## 2014-07-15 DIAGNOSIS — I10 Essential (primary) hypertension: Secondary | ICD-10-CM | POA: Diagnosis not present

## 2014-07-15 DIAGNOSIS — E780 Pure hypercholesterolemia: Secondary | ICD-10-CM | POA: Diagnosis not present

## 2014-07-15 DIAGNOSIS — K635 Polyp of colon: Secondary | ICD-10-CM | POA: Insufficient documentation

## 2014-07-15 DIAGNOSIS — Z87891 Personal history of nicotine dependence: Secondary | ICD-10-CM | POA: Insufficient documentation

## 2014-07-15 HISTORY — PX: COLONOSCOPY WITH PROPOFOL: SHX5780

## 2014-07-15 SURGERY — COLONOSCOPY
Anesthesia: Moderate Sedation

## 2014-07-15 SURGERY — COLONOSCOPY WITH PROPOFOL
Anesthesia: Monitor Anesthesia Care

## 2014-07-15 MED ORDER — PROPOFOL 10 MG/ML IV BOLUS
INTRAVENOUS | Status: AC
  Filled 2014-07-15: qty 20

## 2014-07-15 MED ORDER — LIDOCAINE HCL (CARDIAC) 20 MG/ML IV SOLN
INTRAVENOUS | Status: AC
  Filled 2014-07-15: qty 5

## 2014-07-15 MED ORDER — ONDANSETRON HCL 4 MG/2ML IJ SOLN
INTRAMUSCULAR | Status: DC | PRN
  Administered 2014-07-15: 4 mg via INTRAVENOUS

## 2014-07-15 MED ORDER — PROPOFOL INFUSION 10 MG/ML OPTIME
INTRAVENOUS | Status: DC | PRN
  Administered 2014-07-15: 160 ug/kg/min via INTRAVENOUS

## 2014-07-15 MED ORDER — LACTATED RINGERS IV SOLN
INTRAVENOUS | Status: DC
Start: 2014-07-15 — End: 2014-07-15
  Administered 2014-07-15: 1000 mL via INTRAVENOUS

## 2014-07-15 MED ORDER — ONDANSETRON HCL 4 MG/2ML IJ SOLN
INTRAMUSCULAR | Status: AC
  Filled 2014-07-15: qty 2

## 2014-07-15 MED ORDER — PROPOFOL 10 MG/ML IV BOLUS
INTRAVENOUS | Status: DC | PRN
  Administered 2014-07-15 (×2): 20 mg via INTRAVENOUS
  Administered 2014-07-15 (×2): 30 mg via INTRAVENOUS

## 2014-07-15 MED ORDER — LIDOCAINE HCL (CARDIAC) 20 MG/ML IV SOLN
INTRAVENOUS | Status: DC | PRN
  Administered 2014-07-15: 100 mg via INTRAVENOUS

## 2014-07-15 SURGICAL SUPPLY — 21 items

## 2014-07-15 NOTE — Transfer of Care (Signed)
Immediate Anesthesia Transfer of Care Note  Patient: [name]  Procedure(s) Performed: Procedure(s) (LRB): COLONOSCOPY WITH PROPOFOL (N/A)  Patient Location: PACU  Anesthesia Type: MAC  Level of Consciousness: sedated, patient cooperative and responds to stimulation  Airway & Oxygen Therapy: Patient Spontanous Breathing and Patient connected to face mask oxgen  Post-op Assessment: Report given to PACU RN and Post -op Vital signs reviewed and stable  Post vital signs: Reviewed and stable  Complications: No apparent anesthesia complications

## 2014-07-15 NOTE — Discharge Instructions (Signed)
Colonoscopy, Care After Refer to this sheet in the next few weeks. These instructions provide you with information on caring for yourself after your procedure. Your health care provider may also give you more specific instructions. Your treatment has been planned according to current medical practices, but problems sometimes occur. Call your health care provider if you have any problems or questions after your procedure. WHAT TO EXPECT AFTER THE PROCEDURE  After your procedure, it is typical to have the following:  A small amount of blood in your stool.  Moderate amounts of gas and mild abdominal cramping or bloating. HOME CARE INSTRUCTIONS  Do not drive, operate machinery, or sign important documents for 24 hours.  You may shower and resume your regular physical activities, but move at a slower pace for the first 24 hours.  Take frequent rest periods for the first 24 hours.  Walk around or put a warm pack on your abdomen to help reduce abdominal cramping and bloating.  Drink enough fluids to keep your urine clear or pale yellow.  You may resume your normal diet as instructed by your health care provider. Avoid heavy or fried foods that are hard to digest.  Avoid drinking alcohol for 24 hours or as instructed by your health care provider.  Only take over-the-counter or prescription medicines as directed by your health care provider.  If a tissue sample (biopsy) was taken during your procedure:  Do not take aspirin or blood thinners for 7 days, or as instructed by your health care provider.  Do not drink alcohol for 7 days, or as instructed by your health care provider.  Eat soft foods for the first 24 hours. SEEK MEDICAL CARE IF: You have persistent spotting of blood in your stool 2-3 days after the procedure. SEEK IMMEDIATE MEDICAL CARE IF:  You have more than a small spotting of blood in your stool.  You pass large blood clots in your stool.  Your abdomen is swollen  (distended).  You have nausea or vomiting.  You have a fever.  You have increasing abdominal pain that is not relieved with medicine. Document Released: 04/10/2004 Document Revised: 06/17/2013 Document Reviewed: 05/04/2013 [facility] Patient Information 2015 [place], [place]. This information is not intended to replace advice given to you by your health care provider. Make sure you discuss any questions you have with your health care provider.

## 2014-07-15 NOTE — Anesthesia Postprocedure Evaluation (Signed)
  Anesthesia Post-op Note  Patient: [name]  Procedure(s) Performed: Procedure(s) (LRB): COLONOSCOPY WITH PROPOFOL (N/A)  Patient Location: PACU  Anesthesia Type: MAC  Level of Consciousness: awake and alert   Airway and Oxygen Therapy: Patient Spontanous Breathing  Post-op Pain: mild  Post-op Assessment: Post-op Vital signs reviewed, Patient's Cardiovascular Status Stable, Respiratory Function Stable, Patent Airway and No signs of Nausea or vomiting  Last Vitals:  Filed Vitals:   07/15/14 1055  BP: 159/78  Pulse: 64  Temp: 37 C  Resp: 18    Post-op Vital Signs: stable   Complications: No apparent anesthesia complications

## 2014-07-15 NOTE — H&P (Signed)
  Problem: Chronic universal ulcerative colitis since 1983. 02/10/2008 colonoscopy showed inactive ulcerative colitis without mucosal dysplasia. Surveillance colonoscopy is scheduled today.  History: The patient is a 63 year old male born 1951-06-29. He was diagnosed with Universal ulcerative proctocolitis in 1983. Symptomatically, his ulcerative colitis is in remission on chronic azathioprine therapy. He is scheduled to undergo a surveillance colonoscopy today  Medication allergies: None  Past medical history: Hypertension. Hypercholesterolemia. Universal ulcerative proctocolitis diagnosed in 1983. Resolved  Bell's palsy. Left ankle surgery. Shoulder surgery.  Exam: The patient is alert and lying comfortably on the endoscopy stretcher. Abdomen is soft and nontender to palpation. Lungs are clear to auscultation. Cardiac exam reveals a regular rhythm.  Plan: Proceed with surveillance colonoscopy and perform surveillance mucosal biopsies to rule out colonic mucosal dysplasia

## 2014-07-15 NOTE — Op Note (Signed)
Procedure: Surveillance colonoscopy. Chronic universal ulcerative proctocolitis since 1983.  Endoscopist: Earle Gell  Premedication: Propofol administered by anesthesia  Procedure: The patient was placed in the left lateral decubitus position. Anal inspection and digital rectal exam were normal. The Pentax pediatric colonoscope was introduced into the rectum and advanced to the cecum which was reached at 10:30 AM. A normal-appearing appendiceal orifice was identified. A normal-appearing ileocecal valve was intubated and the terminal ileum inspected. Colonic preparation for the exam today was good. The procedure was terminated at 10:47 AM. Withdrawal time was 17 minutes.  Rectum. Normal. Retroflexed view of the distal rectum normal  Sigmoid colon and descending colon. Inactive ulcerative colitis with scattered less than 5 mm sized pseudopolyps  Splenic flexure. Normal  Transverse colon. Inactive ulcerative colitis with scattered less than 5 mm sized pseudopolyps  Hepatic flexure. Normal  Ascending colon. Inactive ulcerative colitis. A 4 mm sized sessile polyp was removed with the cold biopsy forceps  Cecum and ileocecal valve. Inactive ulcerative colitis.  Terminal ileum. Normal  Surveillance biopsies: Four quadrant biopsies were performed approximately every 10 cm along the length of the colon and rectum. A total of 32 biopsies were submitted for evaluation ( 8 biopsies were submitted in the right colon bottle, 8 biopsies were submitted in the transverse colon bottle, 8 biopsies were submitted in the left colon bottle, 8 biopsies were submitted in the rectosigmoid bottle).  Assessment:  #1. Inactive ulcerative proctocolitis associated with scattered less than 5 mm sized pseudopolyps  #2. A 4 mm sized sessile polyp was removed from the proximal descending colon and submitted in the same bottle with the right colon surveillance biopsies

## 2014-07-16 ENCOUNTER — Encounter (HOSPITAL_COMMUNITY): Payer: Self-pay | Admitting: Gastroenterology

## 2014-09-10 DIAGNOSIS — D126 Benign neoplasm of colon, unspecified: Secondary | ICD-10-CM

## 2014-09-10 HISTORY — DX: Benign neoplasm of colon, unspecified: D12.6

## 2014-10-27 DIAGNOSIS — I10 Essential (primary) hypertension: Secondary | ICD-10-CM | POA: Diagnosis not present

## 2014-10-27 DIAGNOSIS — F419 Anxiety disorder, unspecified: Secondary | ICD-10-CM | POA: Diagnosis not present

## 2014-10-27 DIAGNOSIS — K5100 Ulcerative (chronic) pancolitis without complications: Secondary | ICD-10-CM | POA: Diagnosis not present

## 2014-11-10 DIAGNOSIS — H524 Presbyopia: Secondary | ICD-10-CM | POA: Diagnosis not present

## 2014-11-10 DIAGNOSIS — H11153 Pinguecula, bilateral: Secondary | ICD-10-CM | POA: Diagnosis not present

## 2014-11-10 DIAGNOSIS — H2513 Age-related nuclear cataract, bilateral: Secondary | ICD-10-CM | POA: Diagnosis not present

## 2014-12-15 DIAGNOSIS — I10 Essential (primary) hypertension: Secondary | ICD-10-CM | POA: Diagnosis not present

## 2014-12-15 DIAGNOSIS — K51013 Ulcerative (chronic) pancolitis with fistula: Secondary | ICD-10-CM | POA: Diagnosis not present

## 2014-12-15 DIAGNOSIS — F419 Anxiety disorder, unspecified: Secondary | ICD-10-CM | POA: Diagnosis not present

## 2015-03-17 ENCOUNTER — Emergency Department (HOSPITAL_BASED_OUTPATIENT_CLINIC_OR_DEPARTMENT_OTHER)
Admission: EM | Admit: 2015-03-17 | Discharge: 2015-03-17 | Disposition: A | Discharge status: Home / Self Care | Payer: Commercial Managed Care - HMO | Attending: Emergency Medicine | Admitting: Emergency Medicine

## 2015-03-17 ENCOUNTER — Encounter (HOSPITAL_BASED_OUTPATIENT_CLINIC_OR_DEPARTMENT_OTHER): Payer: Self-pay

## 2015-03-17 DIAGNOSIS — Z79899 Other long term (current) drug therapy: Secondary | ICD-10-CM | POA: Diagnosis not present

## 2015-03-17 DIAGNOSIS — S61011A Laceration without foreign body of right thumb without damage to nail, initial encounter: Secondary | ICD-10-CM | POA: Insufficient documentation

## 2015-03-17 DIAGNOSIS — Y9389 Activity, other specified: Secondary | ICD-10-CM | POA: Diagnosis not present

## 2015-03-17 DIAGNOSIS — Z23 Encounter for immunization: Secondary | ICD-10-CM | POA: Insufficient documentation

## 2015-03-17 DIAGNOSIS — Y998 Other external cause status: Secondary | ICD-10-CM | POA: Diagnosis not present

## 2015-03-17 DIAGNOSIS — W231XXA Caught, crushed, jammed, or pinched between stationary objects, initial encounter: Secondary | ICD-10-CM | POA: Diagnosis not present

## 2015-03-17 DIAGNOSIS — Z8719 Personal history of other diseases of the digestive system: Secondary | ICD-10-CM | POA: Insufficient documentation

## 2015-03-17 DIAGNOSIS — Y9289 Other specified places as the place of occurrence of the external cause: Secondary | ICD-10-CM | POA: Insufficient documentation

## 2015-03-17 DIAGNOSIS — S61219A Laceration without foreign body of unspecified finger without damage to nail, initial encounter: Secondary | ICD-10-CM

## 2015-03-17 MED ORDER — LIDOCAINE HCL (PF) 2 % IJ SOLN
INTRAMUSCULAR | Status: AC
  Administered 2015-03-17: 5 mL
  Filled 2015-03-17: qty 2

## 2015-03-17 MED ORDER — TETANUS-DIPHTH-ACELL PERTUSSIS 5-2.5-18.5 LF-MCG/0.5 IM SUSP
0.5000 mL | Freq: Once | INTRAMUSCULAR | Status: AC
  Administered 2015-03-17: 0.5 mL via INTRAMUSCULAR
  Filled 2015-03-17: qty 0.5

## 2015-03-17 MED ORDER — LIDOCAINE HCL 2 % IJ SOLN: 5.0000 mL | Freq: Once | INTRAMUSCULAR | Status: AC

## 2015-03-17 NOTE — ED Provider Notes (Signed)
CSN: [id number]     Arrival date & time 03/17/15  1330 History   First MD Initiated Contact with Patient 03/17/15 1418     Chief Complaint  Patient presents with  . Finger Injury     (Consider location/radiation/quality/duration/timing/severity/associated sxs/prior Treatment) HPI Comments: Patient presents with complaint of right thumb laceration sustained an approximately 11:30 AM when he struck his thumb with a ratchet while changing a tire. Patient cleaned the cut out with water. He was urged to come to the emergency department because of the cut possibly needing stitches. Unknown last tetanus. Full range of motion of the finger without weakness, numbness, or tingling. No other injury reported. Onset acute. Course is constant. Nothing makes the symptoms better or worse.  The history is provided by the patient.    Past Medical History  Diagnosis Date  . Ulcerative colitis   . Colitis   . No pertinent past medical history    Past Surgical History  Procedure Laterality Date  . Shoulder surgery      LEFT  . Upper gastrointestinal endoscopy    . Colonoscopy    . Tonsillectomy    . Orif ankle fracture  02/27/2012    Procedure: OPEN REDUCTION INTERNAL FIXATION (ORIF) ANKLE FRACTURE;  Surgeon: Colin Rhein, MD;  Location: [facility];  Service: Orthopedics;  Laterality: Left;  ORIF left bimalleolar ankle fracture  . Colonoscopy with propofol N/A 07/15/2014    Procedure: COLONOSCOPY WITH PROPOFOL;  Surgeon: [name], MD;  Location: [facility];  Service: Endoscopy;  Laterality: N/A;   No family history on file. History  Substance Use Topics  . Smoking status: Former Smoker    Quit date: 02/25/1982  . Smokeless tobacco: Not on file  . Alcohol Use: Yes     Comment: occ    Review of Systems  Constitutional: Negative for activity change.  Musculoskeletal: Positive for myalgias. Negative for back pain, joint swelling, arthralgias and neck pain.  Skin: Positive  for wound.  Neurological: Negative for weakness and numbness.      Allergies  Review of patient's allergies indicates no known allergies.  Home Medications   Prior to Admission medications   Medication Sig Start Date End Date Taking? Authorizing Provider  amitriptyline (ELAVIL) 25 MG tablet Take 25 mg by mouth at bedtime.    Historical Provider, MD  azaTHIOprine (IMURAN) 50 MG tablet Take 200 mg by mouth every morning.     Historical Provider, MD  diphenhydrAMINE (BENADRYL) 25 mg capsule Take 25 mg by mouth daily as needed for allergies.    Historical Provider, MD  Glucosamine-Chondroitin (GLUCOSAMINE CHONDR COMPLEX PO) Take 1 tablet by mouth every morning.    Historical Provider, MD  ibuprofen (ADVIL,MOTRIN) 200 MG tablet Take 400-600 mg by mouth every 6 (six) hours as needed for mild pain.    Historical Provider, MD  LORazepam (ATIVAN) 1 MG tablet Take 1-2 mg by mouth every morning.    Historical Provider, MD  Multiple Vitamins-Minerals (MULTIVITAMIN WITH MINERALS) tablet Take 1 tablet by mouth every morning.     Historical Provider, MD  OVER THE COUNTER MEDICATION 1 puff daily as needed (shortness of breath/allergies.). Sinus inhaler.    Historical Provider, MD  UNKNOWN TO PATIENT BP Med   Yes Historical Provider, MD   BP 148/79 mmHg  Pulse 96  Temp(Src) 98.3 F (36.8 C) (Oral)  Resp 20  Ht 5' 7"  (1.702 m)  Wt 235 lb (106.595 kg)  BMI 36.80 kg/m2  SpO2 95%   Physical Exam  Constitutional: He appears well-developed and well-nourished.  HENT:  Head: Normocephalic and atraumatic.  Eyes: Conjunctivae are normal.  Neck: Normal range of motion. Neck supple.  Pulmonary/Chest: No respiratory distress.  Musculoskeletal:  Full range of motion of thumb in all directions including flexion, extension, abduction, abduction. Normal strength.  Neurological: He is alert.  No sensation deficit detected.  Skin: Skin is warm and dry.  2 cm laceration to the radial aspect of the extensor  surface of the right thumb just distal to the MCP. Wound is a partial avulsion. Base of wound explored and no tendon injury suspected. No foreign bodies. Wound is clean, hemostatic.  Psychiatric: He has a normal mood and affect.  Nursing note and vitals reviewed.   ED Course  Procedures (including critical care time) Labs Review Labs Reviewed - No data to display  Imaging Review No results found.   EKG Interpretation None       2:40 PM Patient seen and examined. Work-up initiated.    Vital signs reviewed and are as follows: BP 148/79 mmHg  Pulse 96  Temp(Src) 98.3 F (36.8 C) (Oral)  Resp 20  Ht 5' 7"  (1.702 m)  Wt 235 lb (106.595 kg)  BMI 36.80 kg/m2  SpO2 95%  LACERATION REPAIR Performed by: Faustino Congress Authorized by: Faustino Congress Consent: Verbal consent obtained. Risks and benefits: risks, benefits and alternatives were discussed Consent given by: patient Patient identity confirmed: provided demographic data Prepped and Draped in normal sterile fashion Wound explored  Laceration Location: R rthumb  Laceration Length: 2cm  No Foreign Bodies seen or palpated  Anesthesia: local infiltration  Local anesthetic: lidocaine 2% with epinephrine  Anesthetic total: 2 ml  Irrigation method: skin scrub with dermal cleanser, rinsed with saline prior Amount of cleaning: standard  Skin closure: 5-0 Ethilon  Number of sutures: 2  Technique: simple interrupted  Patient tolerance: Patient tolerated the procedure well with no immediate complications.   Flap overlying the wound was debrided. Wound edges were pulled toward each other, however given missing flap could not be well approximated as expected.  Patient counseled on wound care. Patient counseled on need to return or see PCP/urgent care for suture removal in 7-10 days. Patient was urged to return to the Emergency Department urgently with worsening pain, swelling, expanding erythema especially if it  streaks away from the affected area, fever, or if they have any other concerns. Patient verbalized understanding.     MDM   Final diagnoses:  Finger laceration, initial encounter   Patient with minor finger laceration. No deep structure injury suspected. Patient has full range of motion in the thumb with normal strength. Wound was well cleaned prior to repairing during repair. Wound is partially open due to avulsion as noted. Feel low risk for tissue infection. No concern for underlying fracture. Return instructions discussed with patient at bedside. Tetanus updated.    Carlisle Cater, PA-C 03/17/15 [place], MD 03/18/15 [phone]

## 2015-03-17 NOTE — ED Notes (Signed)
PA at bedside.

## 2015-03-17 NOTE — ED Notes (Signed)
Pt states he wishes to leave due to wait- length of time in dept approx 70mn- wound care has been done and pt has been assessed by RN. ED procedures and typical wait times explained to pt. Pt encouraged to stay for evaluation by provider. Pt agrees to wait for now

## 2015-03-17 NOTE — ED Notes (Signed)
Wound care discussed- no new Rx given

## 2015-03-17 NOTE — Discharge Instructions (Signed)
Please read and follow all provided instructions.  Your diagnoses today include:  1. Finger laceration, initial encounter    Tests performed today include:  Vital signs. See below for your results today.   Medications prescribed:   None  Take any prescribed medications only as directed.   Home care instructions:  Follow any educational materials and wound care instructions contained in this packet.   Keep affected area above the level of your heart when possible to minimize swelling. Wash area gently twice a day with warm soapy water. Do not apply alcohol or hydrogen peroxide. Cover the area if it draining or weeping.   Follow-up instructions: Suture Removal: Return to the Emergency Department or see your primary care care doctor in 7-10 days for a recheck of your wound and removal of your sutures or staples.    Return instructions:  Return to the Emergency Department if you have:  Fever  Worsening pain  Worsening swelling of the wound  Pus draining from the wound  Redness of the skin that moves away from the wound, especially if it streaks away from the affected area   Any other emergent concerns  Your vital signs today were: BP 148/79 mmHg   Pulse 96   Temp(Src) 98.3 F (36.8 C) (Oral)   Resp 20   Ht 5' 7"  (1.702 m)   Wt 235 lb (106.595 kg)   BMI 36.80 kg/m2   SpO2 95% If your blood pressure (BP) was elevated above 135/85 this visit, please have this repeated by your doctor within one month. --------------

## 2015-03-17 NOTE — ED Notes (Signed)
Right thumb injury while changing tire approx 1 hour PTA

## 2015-06-16 ENCOUNTER — Other Ambulatory Visit: Payer: Self-pay | Admitting: Gastroenterology

## 2015-06-16 DIAGNOSIS — I10 Essential (primary) hypertension: Secondary | ICD-10-CM | POA: Diagnosis not present

## 2015-06-16 DIAGNOSIS — K5289 Other specified noninfective gastroenteritis and colitis: Secondary | ICD-10-CM | POA: Diagnosis not present

## 2015-06-16 DIAGNOSIS — F411 Generalized anxiety disorder: Secondary | ICD-10-CM | POA: Diagnosis not present

## 2015-06-16 DIAGNOSIS — Z23 Encounter for immunization: Secondary | ICD-10-CM | POA: Diagnosis not present

## 2015-06-16 DIAGNOSIS — Z5181 Encounter for therapeutic drug level monitoring: Secondary | ICD-10-CM | POA: Diagnosis not present

## 2015-06-16 DIAGNOSIS — Z1211 Encounter for screening for malignant neoplasm of colon: Secondary | ICD-10-CM | POA: Diagnosis not present

## 2015-06-16 DIAGNOSIS — K5100 Ulcerative (chronic) pancolitis without complications: Secondary | ICD-10-CM | POA: Diagnosis not present

## 2015-06-16 DIAGNOSIS — E7800 Pure hypercholesterolemia, unspecified: Secondary | ICD-10-CM | POA: Diagnosis not present

## 2015-06-29 DIAGNOSIS — I10 Essential (primary) hypertension: Secondary | ICD-10-CM | POA: Diagnosis not present

## 2015-06-29 DIAGNOSIS — F329 Major depressive disorder, single episode, unspecified: Secondary | ICD-10-CM | POA: Diagnosis not present

## 2015-06-29 DIAGNOSIS — Z79899 Other long term (current) drug therapy: Secondary | ICD-10-CM | POA: Diagnosis not present

## 2015-06-29 DIAGNOSIS — Z0001 Encounter for general adult medical examination with abnormal findings: Secondary | ICD-10-CM | POA: Diagnosis not present

## 2015-06-29 DIAGNOSIS — F419 Anxiety disorder, unspecified: Secondary | ICD-10-CM | POA: Diagnosis not present

## 2015-06-29 DIAGNOSIS — Z23 Encounter for immunization: Secondary | ICD-10-CM | POA: Diagnosis not present

## 2015-06-29 DIAGNOSIS — E7800 Pure hypercholesterolemia, unspecified: Secondary | ICD-10-CM | POA: Diagnosis not present

## 2015-06-29 DIAGNOSIS — E559 Vitamin D deficiency, unspecified: Secondary | ICD-10-CM | POA: Diagnosis not present

## 2015-08-08 ENCOUNTER — Other Ambulatory Visit: Payer: Self-pay | Admitting: Gastroenterology

## 2015-08-10 ENCOUNTER — Other Ambulatory Visit: Payer: Self-pay | Admitting: Gastroenterology

## 2015-08-15 ENCOUNTER — Encounter (HOSPITAL_COMMUNITY): Payer: Self-pay | Admitting: *Deleted

## 2015-08-16 DIAGNOSIS — F419 Anxiety disorder, unspecified: Secondary | ICD-10-CM | POA: Diagnosis not present

## 2015-08-16 DIAGNOSIS — F329 Major depressive disorder, single episode, unspecified: Secondary | ICD-10-CM | POA: Diagnosis not present

## 2015-08-16 DIAGNOSIS — E876 Hypokalemia: Secondary | ICD-10-CM | POA: Diagnosis not present

## 2015-08-22 ENCOUNTER — Ambulatory Visit (HOSPITAL_COMMUNITY): Payer: Commercial Managed Care - HMO | Admitting: Anesthesiology

## 2015-08-22 ENCOUNTER — Encounter (HOSPITAL_COMMUNITY)
Admission: RE | Disposition: A | Discharge status: Home / Self Care | Payer: Self-pay | Source: Ambulatory Visit | Attending: Gastroenterology

## 2015-08-22 ENCOUNTER — Ambulatory Visit (HOSPITAL_COMMUNITY)
Admission: RE | Admit: 2015-08-22 | Discharge: 2015-08-22 | Disposition: A | Discharge status: Home / Self Care | Payer: Commercial Managed Care - HMO | Source: Ambulatory Visit | Attending: Gastroenterology | Admitting: Gastroenterology

## 2015-08-22 ENCOUNTER — Encounter (HOSPITAL_COMMUNITY): Payer: Self-pay

## 2015-08-22 DIAGNOSIS — Z86010 Personal history of colonic polyps: Secondary | ICD-10-CM | POA: Insufficient documentation

## 2015-08-22 DIAGNOSIS — K219 Gastro-esophageal reflux disease without esophagitis: Secondary | ICD-10-CM | POA: Insufficient documentation

## 2015-08-22 DIAGNOSIS — Z6837 Body mass index (BMI) 37.0-37.9, adult: Secondary | ICD-10-CM | POA: Insufficient documentation

## 2015-08-22 DIAGNOSIS — E7800 Pure hypercholesterolemia, unspecified: Secondary | ICD-10-CM | POA: Insufficient documentation

## 2015-08-22 DIAGNOSIS — I10 Essential (primary) hypertension: Secondary | ICD-10-CM | POA: Insufficient documentation

## 2015-08-22 DIAGNOSIS — D124 Benign neoplasm of descending colon: Secondary | ICD-10-CM | POA: Insufficient documentation

## 2015-08-22 DIAGNOSIS — D120 Benign neoplasm of cecum: Secondary | ICD-10-CM | POA: Diagnosis not present

## 2015-08-22 DIAGNOSIS — Z8711 Personal history of peptic ulcer disease: Secondary | ICD-10-CM | POA: Diagnosis not present

## 2015-08-22 DIAGNOSIS — Z87891 Personal history of nicotine dependence: Secondary | ICD-10-CM | POA: Diagnosis not present

## 2015-08-22 DIAGNOSIS — E669 Obesity, unspecified: Secondary | ICD-10-CM | POA: Insufficient documentation

## 2015-08-22 DIAGNOSIS — D123 Benign neoplasm of transverse colon: Secondary | ICD-10-CM | POA: Insufficient documentation

## 2015-08-22 DIAGNOSIS — Z1211 Encounter for screening for malignant neoplasm of colon: Secondary | ICD-10-CM | POA: Diagnosis not present

## 2015-08-22 DIAGNOSIS — K6389 Other specified diseases of intestine: Secondary | ICD-10-CM | POA: Diagnosis not present

## 2015-08-22 DIAGNOSIS — K5190 Ulcerative colitis, unspecified, without complications: Secondary | ICD-10-CM | POA: Diagnosis not present

## 2015-08-22 DIAGNOSIS — D127 Benign neoplasm of rectosigmoid junction: Secondary | ICD-10-CM | POA: Diagnosis not present

## 2015-08-22 HISTORY — PX: COLONOSCOPY WITH PROPOFOL: SHX5780

## 2015-08-22 SURGERY — COLONOSCOPY WITH PROPOFOL
Anesthesia: Monitor Anesthesia Care

## 2015-08-22 MED ORDER — PROPOFOL 10 MG/ML IV BOLUS
INTRAVENOUS | Status: AC
  Filled 2015-08-22: qty 20

## 2015-08-22 MED ORDER — SODIUM CHLORIDE 0.9 % IV SOLN: INTRAVENOUS | Status: DC

## 2015-08-22 MED ORDER — PROPOFOL 10 MG/ML IV BOLUS
INTRAVENOUS | Status: DC | PRN
  Administered 2015-08-22 (×2): 20 mg via INTRAVENOUS

## 2015-08-22 MED ORDER — PROPOFOL 10 MG/ML IV BOLUS
INTRAVENOUS | Status: AC
  Filled 2015-08-22: qty 40

## 2015-08-22 MED ORDER — FENTANYL CITRATE (PF) 100 MCG/2ML IJ SOLN: 25.0000 ug | INTRAMUSCULAR | Status: DC | PRN

## 2015-08-22 MED ORDER — LACTATED RINGERS IV SOLN
INTRAVENOUS | Status: DC
  Administered 2015-08-22: 1000 mL via INTRAVENOUS

## 2015-08-22 MED ORDER — PROPOFOL 500 MG/50ML IV EMUL
INTRAVENOUS | Status: DC | PRN
  Administered 2015-08-22: 140 ug/kg/min via INTRAVENOUS

## 2015-08-22 MED ORDER — LACTATED RINGERS IV SOLN
INTRAVENOUS | Status: DC
  Administered 2015-08-22: 09:00:00 via INTRAVENOUS

## 2015-08-22 SURGICAL SUPPLY — 21 items

## 2015-08-22 NOTE — Anesthesia Preprocedure Evaluation (Addendum)
Anesthesia Evaluation  Patient identified by MRN, date of birth, ID band Patient awake    Reviewed: Allergy & Precautions, H&P , NPO status , Patient's Chart, lab work & pertinent test results  History of Anesthesia Complications Negative for: history of anesthetic complications  Airway Mallampati: II  TM Distance: >3 FB Neck ROM: Full    Dental  (+) Upper Dentures, Lower Dentures, Dental Advisory Given   Pulmonary former smoker,    Pulmonary exam normal breath sounds clear to auscultation       Cardiovascular Exercise Tolerance: Good negative cardio ROS Normal cardiovascular exam Rhythm:Regular Rate:Normal     Neuro/Psych negative neurological ROS  negative psych ROS   GI/Hepatic Neg liver ROS, PUD, Ulcerative colitis   Endo/Other  negative endocrine ROSMorbid obesity  Renal/GU negative Renal ROS  negative genitourinary   Musculoskeletal negative musculoskeletal ROS (+)   Abdominal (+) + obese,   Peds negative pediatric ROS (+)  Hematology negative hematology ROS (+)   Anesthesia Other Findings   Reproductive/Obstetrics negative OB ROS                           Anesthesia Physical Anesthesia Plan  ASA: III  Anesthesia Plan: MAC   Post-op Pain Management:    Induction:   Airway Management Planned:   Additional Equipment:   Intra-op Plan:   Post-operative Plan:   Informed Consent:   Plan Discussed with: Surgeon  Anesthesia Plan Comments:        Anesthesia Quick Evaluation

## 2015-08-22 NOTE — Transfer of Care (Signed)
Immediate Anesthesia Transfer of Care Note  Patient: [name]  Procedure(s) Performed: Procedure(s): COLONOSCOPY WITH PROPOFOL (N/A)  Patient Location: Endoscopy Unit  Anesthesia Type:MAC  Level of Consciousness: awake  Airway & Oxygen Therapy: Patient Spontanous Breathing and Patient connected to face mask oxygen  Post-op Assessment: Report given to RN and Post -op Vital signs reviewed and stable  Post vital signs: Reviewed and stable  Last Vitals:  Filed Vitals:   08/22/15 0753  BP: 168/76  Pulse: 79  Temp: 37.3 C  Resp: 13    Complications: No apparent anesthesia complications

## 2015-08-22 NOTE — Anesthesia Postprocedure Evaluation (Signed)
Anesthesia Post Note  Patient: [name]  Procedure(s) Performed: Procedure(s) (LRB): COLONOSCOPY WITH PROPOFOL (N/A)  Patient location during evaluation: PACU Anesthesia Type: MAC Level of consciousness: awake and alert Pain management: pain level controlled Vital Signs Assessment: post-procedure vital signs reviewed and stable Respiratory status: spontaneous breathing, nonlabored ventilation, respiratory function stable and patient connected to nasal cannula oxygen Cardiovascular status: blood pressure returned to baseline and stable Postop Assessment: no signs of nausea or vomiting Anesthetic complications: no    Last Vitals:  Filed Vitals:   08/22/15 0955 08/22/15 1000  BP:  131/74  Pulse: 65 63  Temp:    Resp: 15 20    Last Pain: There were no vitals filed for this visit.               Lizmarie Witters L

## 2015-08-22 NOTE — Op Note (Signed)
Procedure: Surveillance colonoscopy. Universal ulcerative proctocolitis diagnosed in 1983.  Endoscopist: Earle Gell  Premedication: Propofol administered by anesthesia  Procedure: The patient was placed in the left lateral decubitus position. Anal inspection and digital rectal exam were normal. Pentax pediatric colonoscope was introduced into the rectum and advanced to the cecum. A normal-appearing ileocecal valve and appendiceal orifice were identified. Colonic preparation for the exam today was good. Withdrawal time was 29 minutes  Rectum. Normal. Retroflexed view of the distal rectum was normal. No signs of active proctitis.  Sigmoid colon and descending colon. Scattered less than 5 mm pseudopolyps were present throughout the left colon. At 50 cm from the anal verge, a 5 mm sessile polyp was removed with the cold snare and submitted for pathological interpretation. No signs of active colitis.  Splenic flexure. Normal  Transverse colon. Normal  Hepatic flexure. Normal  Ascending colon. Normal  Cecum and ileocecal valve. Normal.  Surveillance mucosal biopsies: Four quadrant biopsies every 10 cm were performed along the length of the entire colon and rectum. A total of 32 biopsies were performed.  Assessment:  #1. Inactive universal ulcerative proctocolitis  #2. Scattered less than 5 mm pseudopolyps were present in the left colon  #3. From the proximal descending colon, a 5 mm sessile polyp was removed with the cold snare  #4. Surveillance mucosal biopsies were performed from the colon and rectum. Four quadrant biopsies were performed approximately every 10 cm. A total of 32 surveillance mucosal biopsies were performed to rule out mucosal dysplasia

## 2015-08-22 NOTE — Discharge Instructions (Signed)
Colonoscopy, Care After These instructions give you information on caring for yourself after your procedure. Your doctor may also give you more specific instructions. Call your doctor if you have any problems or questions after your procedure. HOME CARE  Do not drive for 24 hours.  Do not sign important papers or use machinery for 24 hours.  You may shower.  You may go back to your usual activities, but go slower for the first 24 hours.  Take rest breaks often during the first 24 hours.  Walk around or use warm packs on your belly (abdomen) if you have belly cramping or gas.  Drink enough fluids to keep your pee (urine) clear or pale yellow.  Resume your normal diet. Avoid heavy or fried foods.  Avoid drinking alcohol for 24 hours or as told by your doctor.  Only take medicines as told by your doctor. If a tissue sample (biopsy) was taken during the procedure:   Do not take aspirin or blood thinners for 7 days, or as told by your doctor.  Do not drink alcohol for 7 days, or as told by your doctor.  Eat soft foods for the first 24 hours. GET HELP IF: You still have a small amount of blood in your poop (stool) 2-3 days after the procedure. GET HELP RIGHT AWAY IF:  You have more than a small amount of blood in your poop.  You see clumps of tissue (blood clots) in your poop.  Your belly is puffy (swollen).  You feel sick to your stomach (nauseous) or throw up (vomit).  You have a fever.  You have belly pain that gets worse and medicine does not help. MAKE SURE YOU:  Understand these instructions.  Will watch your condition.  Will get help right away if you are not doing well or get worse.   This information is not intended to replace advice given to you by your health care provider. Make sure you discuss any questions you have with your health care provider.   Document Released: 09/29/2010 Document Revised: 09/01/2013 Document Reviewed: 05/04/2013 Elsevier  Interactive Patient Education [employer].

## 2015-08-22 NOTE — H&P (Signed)
  Procedure: Surveillance colonoscopy. Universal ulcerative proctocolitis diagnosed in 1983. 07/15/2014 colonoscopy showed inactive ulcerative colitis with pseudopolyps. Surveillance mucosal biopsies showed sessile serrated polyp and dysplastic polyp.  History: The patient is a 64 year old male born [birth year]. He is scheduled to undergo a repeat surveillance colonoscopy with surveillance mucosal biopsies. He was diagnosed with Universal ulcerative proctocolitis in 1983.  Past medical history: Hypertension. Hypercholesterolemia. Universal ulcerative proctocolitis diagnosed in 1983. Resolved Bell's palsy. Seasonal allergies. Gastroesophageal reflux. Shoulder surgery. Left ankle surgery.  Medication allergies: None  Exam: The patient is alert and lying comfortably on the endoscopy stretcher. Abdomen is soft and nontender to palpation. Lungs are clear to auscultation. Cardiac exam reveals a regular rhythm.  Plan: Proceed with surveillance colonoscopy

## 2015-08-23 ENCOUNTER — Encounter (HOSPITAL_COMMUNITY): Payer: Self-pay | Admitting: Gastroenterology

## 2015-09-06 ENCOUNTER — Encounter (HOSPITAL_COMMUNITY): Admission: RE | Payer: Self-pay | Source: Ambulatory Visit

## 2015-09-06 ENCOUNTER — Ambulatory Visit (HOSPITAL_COMMUNITY)
Admission: RE | Admit: 2015-09-06 | Payer: Commercial Managed Care - HMO | Source: Ambulatory Visit | Admitting: Gastroenterology

## 2015-09-06 SURGERY — COLONOSCOPY WITH PROPOFOL
Anesthesia: Monitor Anesthesia Care

## 2015-11-28 DIAGNOSIS — I10 Essential (primary) hypertension: Secondary | ICD-10-CM | POA: Diagnosis not present

## 2015-11-28 DIAGNOSIS — R42 Dizziness and giddiness: Secondary | ICD-10-CM | POA: Diagnosis not present

## 2015-11-28 DIAGNOSIS — R319 Hematuria, unspecified: Secondary | ICD-10-CM | POA: Diagnosis not present

## 2016-01-19 DIAGNOSIS — Z5181 Encounter for therapeutic drug level monitoring: Secondary | ICD-10-CM | POA: Diagnosis not present

## 2016-01-19 DIAGNOSIS — K5100 Ulcerative (chronic) pancolitis without complications: Secondary | ICD-10-CM | POA: Diagnosis not present

## 2016-01-19 DIAGNOSIS — R42 Dizziness and giddiness: Secondary | ICD-10-CM | POA: Diagnosis not present

## 2016-01-19 DIAGNOSIS — F419 Anxiety disorder, unspecified: Secondary | ICD-10-CM | POA: Diagnosis not present

## 2016-01-19 DIAGNOSIS — I10 Essential (primary) hypertension: Secondary | ICD-10-CM | POA: Diagnosis not present

## 2016-01-19 DIAGNOSIS — F329 Major depressive disorder, single episode, unspecified: Secondary | ICD-10-CM | POA: Diagnosis not present

## 2016-03-14 DIAGNOSIS — M1711 Unilateral primary osteoarthritis, right knee: Secondary | ICD-10-CM | POA: Diagnosis not present

## 2016-05-07 DIAGNOSIS — K219 Gastro-esophageal reflux disease without esophagitis: Secondary | ICD-10-CM | POA: Diagnosis not present

## 2016-05-07 DIAGNOSIS — R197 Diarrhea, unspecified: Secondary | ICD-10-CM | POA: Diagnosis not present

## 2016-05-07 DIAGNOSIS — F329 Major depressive disorder, single episode, unspecified: Secondary | ICD-10-CM | POA: Diagnosis not present

## 2016-05-07 DIAGNOSIS — F419 Anxiety disorder, unspecified: Secondary | ICD-10-CM | POA: Diagnosis not present

## 2016-05-07 DIAGNOSIS — E119 Type 2 diabetes mellitus without complications: Secondary | ICD-10-CM | POA: Diagnosis not present

## 2016-05-07 DIAGNOSIS — I10 Essential (primary) hypertension: Secondary | ICD-10-CM | POA: Diagnosis not present

## 2016-06-01 DIAGNOSIS — E119 Type 2 diabetes mellitus without complications: Secondary | ICD-10-CM | POA: Diagnosis not present

## 2016-06-01 DIAGNOSIS — I10 Essential (primary) hypertension: Secondary | ICD-10-CM | POA: Diagnosis not present

## 2016-06-01 DIAGNOSIS — F419 Anxiety disorder, unspecified: Secondary | ICD-10-CM | POA: Diagnosis not present

## 2016-06-01 DIAGNOSIS — Z139 Encounter for screening, unspecified: Secondary | ICD-10-CM | POA: Diagnosis not present

## 2016-06-01 DIAGNOSIS — K219 Gastro-esophageal reflux disease without esophagitis: Secondary | ICD-10-CM | POA: Diagnosis not present

## 2016-06-01 DIAGNOSIS — R197 Diarrhea, unspecified: Secondary | ICD-10-CM | POA: Diagnosis not present

## 2016-06-01 DIAGNOSIS — F329 Major depressive disorder, single episode, unspecified: Secondary | ICD-10-CM | POA: Diagnosis not present

## 2016-06-01 DIAGNOSIS — Z23 Encounter for immunization: Secondary | ICD-10-CM | POA: Diagnosis not present

## 2016-06-01 DIAGNOSIS — Z7984 Long term (current) use of oral hypoglycemic drugs: Secondary | ICD-10-CM | POA: Diagnosis not present

## 2016-07-31 DIAGNOSIS — M1711 Unilateral primary osteoarthritis, right knee: Secondary | ICD-10-CM | POA: Diagnosis not present

## 2016-08-01 DIAGNOSIS — K5100 Ulcerative (chronic) pancolitis without complications: Secondary | ICD-10-CM | POA: Diagnosis not present

## 2016-08-01 DIAGNOSIS — E7800 Pure hypercholesterolemia, unspecified: Secondary | ICD-10-CM | POA: Diagnosis not present

## 2016-08-01 DIAGNOSIS — E559 Vitamin D deficiency, unspecified: Secondary | ICD-10-CM | POA: Diagnosis not present

## 2016-08-01 DIAGNOSIS — Z79899 Other long term (current) drug therapy: Secondary | ICD-10-CM | POA: Diagnosis not present

## 2016-08-01 DIAGNOSIS — F329 Major depressive disorder, single episode, unspecified: Secondary | ICD-10-CM | POA: Diagnosis not present

## 2016-08-01 DIAGNOSIS — R7303 Prediabetes: Secondary | ICD-10-CM | POA: Diagnosis not present

## 2016-08-01 DIAGNOSIS — Z0001 Encounter for general adult medical examination with abnormal findings: Secondary | ICD-10-CM | POA: Diagnosis not present

## 2016-08-01 DIAGNOSIS — I10 Essential (primary) hypertension: Secondary | ICD-10-CM | POA: Diagnosis not present

## 2016-08-01 DIAGNOSIS — F411 Generalized anxiety disorder: Secondary | ICD-10-CM | POA: Diagnosis not present

## 2016-08-01 DIAGNOSIS — Z125 Encounter for screening for malignant neoplasm of prostate: Secondary | ICD-10-CM | POA: Diagnosis not present

## 2016-08-07 DIAGNOSIS — M1711 Unilateral primary osteoarthritis, right knee: Secondary | ICD-10-CM | POA: Diagnosis not present

## 2016-08-14 DIAGNOSIS — M1711 Unilateral primary osteoarthritis, right knee: Secondary | ICD-10-CM | POA: Diagnosis not present

## 2016-08-24 DIAGNOSIS — I10 Essential (primary) hypertension: Secondary | ICD-10-CM | POA: Diagnosis not present

## 2016-08-24 DIAGNOSIS — J329 Chronic sinusitis, unspecified: Secondary | ICD-10-CM | POA: Diagnosis not present

## 2016-09-19 DIAGNOSIS — R195 Other fecal abnormalities: Secondary | ICD-10-CM | POA: Diagnosis not present

## 2016-09-19 DIAGNOSIS — K521 Toxic gastroenteritis and colitis: Secondary | ICD-10-CM | POA: Diagnosis not present

## 2016-10-02 DIAGNOSIS — M1711 Unilateral primary osteoarthritis, right knee: Secondary | ICD-10-CM | POA: Diagnosis not present

## 2016-10-23 DIAGNOSIS — M25561 Pain in right knee: Secondary | ICD-10-CM | POA: Diagnosis not present

## 2016-10-23 DIAGNOSIS — G8929 Other chronic pain: Secondary | ICD-10-CM | POA: Diagnosis not present

## 2016-12-13 DIAGNOSIS — R197 Diarrhea, unspecified: Secondary | ICD-10-CM | POA: Diagnosis not present

## 2016-12-18 DIAGNOSIS — R197 Diarrhea, unspecified: Secondary | ICD-10-CM | POA: Diagnosis not present

## 2016-12-20 DIAGNOSIS — M25561 Pain in right knee: Secondary | ICD-10-CM | POA: Diagnosis not present

## 2016-12-20 DIAGNOSIS — M1712 Unilateral primary osteoarthritis, left knee: Secondary | ICD-10-CM | POA: Diagnosis not present

## 2016-12-24 DIAGNOSIS — R197 Diarrhea, unspecified: Secondary | ICD-10-CM | POA: Diagnosis not present

## 2016-12-24 DIAGNOSIS — K5100 Ulcerative (chronic) pancolitis without complications: Secondary | ICD-10-CM | POA: Diagnosis not present

## 2016-12-24 DIAGNOSIS — R7303 Prediabetes: Secondary | ICD-10-CM | POA: Diagnosis not present

## 2016-12-24 DIAGNOSIS — E559 Vitamin D deficiency, unspecified: Secondary | ICD-10-CM | POA: Diagnosis not present

## 2016-12-24 DIAGNOSIS — F419 Anxiety disorder, unspecified: Secondary | ICD-10-CM | POA: Diagnosis not present

## 2016-12-24 DIAGNOSIS — K219 Gastro-esophageal reflux disease without esophagitis: Secondary | ICD-10-CM | POA: Diagnosis not present

## 2016-12-24 DIAGNOSIS — I10 Essential (primary) hypertension: Secondary | ICD-10-CM | POA: Diagnosis not present

## 2016-12-24 DIAGNOSIS — E7800 Pure hypercholesterolemia, unspecified: Secondary | ICD-10-CM | POA: Diagnosis not present

## 2016-12-27 DIAGNOSIS — M1712 Unilateral primary osteoarthritis, left knee: Secondary | ICD-10-CM | POA: Diagnosis not present

## 2017-01-03 DIAGNOSIS — M1712 Unilateral primary osteoarthritis, left knee: Secondary | ICD-10-CM | POA: Diagnosis not present

## 2017-02-14 DIAGNOSIS — M1712 Unilateral primary osteoarthritis, left knee: Secondary | ICD-10-CM | POA: Diagnosis not present

## 2017-02-14 DIAGNOSIS — S8001XD Contusion of right knee, subsequent encounter: Secondary | ICD-10-CM | POA: Diagnosis not present

## 2017-03-22 DIAGNOSIS — K219 Gastro-esophageal reflux disease without esophagitis: Secondary | ICD-10-CM | POA: Diagnosis not present

## 2017-03-22 DIAGNOSIS — K5100 Ulcerative (chronic) pancolitis without complications: Secondary | ICD-10-CM | POA: Diagnosis not present

## 2017-03-22 DIAGNOSIS — Z86010 Personal history of colonic polyps: Secondary | ICD-10-CM | POA: Diagnosis not present

## 2017-04-24 DIAGNOSIS — R7309 Other abnormal glucose: Secondary | ICD-10-CM | POA: Diagnosis not present

## 2017-04-24 DIAGNOSIS — E559 Vitamin D deficiency, unspecified: Secondary | ICD-10-CM | POA: Diagnosis not present

## 2017-04-24 DIAGNOSIS — J40 Bronchitis, not specified as acute or chronic: Secondary | ICD-10-CM | POA: Diagnosis not present

## 2017-04-24 DIAGNOSIS — I10 Essential (primary) hypertension: Secondary | ICD-10-CM | POA: Diagnosis not present

## 2017-04-24 DIAGNOSIS — E7800 Pure hypercholesterolemia, unspecified: Secondary | ICD-10-CM | POA: Diagnosis not present

## 2017-04-24 DIAGNOSIS — F324 Major depressive disorder, single episode, in partial remission: Secondary | ICD-10-CM | POA: Diagnosis not present

## 2017-04-24 DIAGNOSIS — F419 Anxiety disorder, unspecified: Secondary | ICD-10-CM | POA: Diagnosis not present

## 2017-04-24 DIAGNOSIS — Z6841 Body Mass Index (BMI) 40.0 and over, adult: Secondary | ICD-10-CM | POA: Diagnosis not present

## 2017-04-24 DIAGNOSIS — J209 Acute bronchitis, unspecified: Secondary | ICD-10-CM | POA: Diagnosis not present

## 2017-05-06 DIAGNOSIS — Z86010 Personal history of colonic polyps: Secondary | ICD-10-CM | POA: Diagnosis not present

## 2017-05-06 DIAGNOSIS — D126 Benign neoplasm of colon, unspecified: Secondary | ICD-10-CM | POA: Diagnosis not present

## 2017-05-06 DIAGNOSIS — K5190 Ulcerative colitis, unspecified, without complications: Secondary | ICD-10-CM | POA: Diagnosis not present

## 2017-05-06 DIAGNOSIS — K5289 Other specified noninfective gastroenteritis and colitis: Secondary | ICD-10-CM | POA: Diagnosis not present

## 2017-05-08 DIAGNOSIS — D126 Benign neoplasm of colon, unspecified: Secondary | ICD-10-CM | POA: Diagnosis not present

## 2017-05-08 DIAGNOSIS — K5289 Other specified noninfective gastroenteritis and colitis: Secondary | ICD-10-CM | POA: Diagnosis not present

## 2017-05-14 DIAGNOSIS — F329 Major depressive disorder, single episode, unspecified: Secondary | ICD-10-CM | POA: Diagnosis not present

## 2017-05-14 DIAGNOSIS — I10 Essential (primary) hypertension: Secondary | ICD-10-CM | POA: Diagnosis not present

## 2017-05-14 DIAGNOSIS — R42 Dizziness and giddiness: Secondary | ICD-10-CM | POA: Diagnosis not present

## 2017-05-14 DIAGNOSIS — I160 Hypertensive urgency: Secondary | ICD-10-CM | POA: Diagnosis not present

## 2017-05-28 DIAGNOSIS — R0989 Other specified symptoms and signs involving the circulatory and respiratory systems: Secondary | ICD-10-CM | POA: Diagnosis not present

## 2017-05-28 DIAGNOSIS — I160 Hypertensive urgency: Secondary | ICD-10-CM | POA: Diagnosis not present

## 2017-05-28 DIAGNOSIS — F419 Anxiety disorder, unspecified: Secondary | ICD-10-CM | POA: Diagnosis not present

## 2017-05-28 DIAGNOSIS — R42 Dizziness and giddiness: Secondary | ICD-10-CM | POA: Diagnosis not present

## 2017-05-28 DIAGNOSIS — F329 Major depressive disorder, single episode, unspecified: Secondary | ICD-10-CM | POA: Diagnosis not present

## 2017-05-28 DIAGNOSIS — Z23 Encounter for immunization: Secondary | ICD-10-CM | POA: Diagnosis not present

## 2017-05-28 DIAGNOSIS — I10 Essential (primary) hypertension: Secondary | ICD-10-CM | POA: Diagnosis not present

## 2017-07-03 DIAGNOSIS — I160 Hypertensive urgency: Secondary | ICD-10-CM | POA: Diagnosis not present

## 2017-07-03 DIAGNOSIS — F329 Major depressive disorder, single episode, unspecified: Secondary | ICD-10-CM | POA: Diagnosis not present

## 2017-07-03 DIAGNOSIS — F419 Anxiety disorder, unspecified: Secondary | ICD-10-CM | POA: Diagnosis not present

## 2017-07-03 DIAGNOSIS — I10 Essential (primary) hypertension: Secondary | ICD-10-CM | POA: Diagnosis not present

## 2017-07-03 DIAGNOSIS — R0989 Other specified symptoms and signs involving the circulatory and respiratory systems: Secondary | ICD-10-CM | POA: Diagnosis not present

## 2017-07-03 DIAGNOSIS — M545 Low back pain: Secondary | ICD-10-CM | POA: Diagnosis not present

## 2017-08-09 ENCOUNTER — Other Ambulatory Visit: Payer: Self-pay | Admitting: Internal Medicine

## 2017-08-09 ENCOUNTER — Ambulatory Visit
Admission: RE | Admit: 2017-08-09 | Discharge: 2017-08-09 | Disposition: A | Discharge status: Home / Self Care | Payer: Medicare HMO | Source: Ambulatory Visit | Attending: Internal Medicine | Admitting: Internal Medicine

## 2017-08-09 DIAGNOSIS — Z125 Encounter for screening for malignant neoplasm of prostate: Secondary | ICD-10-CM | POA: Diagnosis not present

## 2017-08-09 DIAGNOSIS — Z1389 Encounter for screening for other disorder: Secondary | ICD-10-CM | POA: Diagnosis not present

## 2017-08-09 DIAGNOSIS — I10 Essential (primary) hypertension: Secondary | ICD-10-CM | POA: Diagnosis not present

## 2017-08-09 DIAGNOSIS — Z0001 Encounter for general adult medical examination with abnormal findings: Secondary | ICD-10-CM | POA: Diagnosis not present

## 2017-08-09 DIAGNOSIS — F419 Anxiety disorder, unspecified: Secondary | ICD-10-CM | POA: Diagnosis not present

## 2017-08-09 DIAGNOSIS — F322 Major depressive disorder, single episode, severe without psychotic features: Secondary | ICD-10-CM | POA: Diagnosis not present

## 2017-08-09 DIAGNOSIS — M545 Low back pain: Secondary | ICD-10-CM | POA: Diagnosis not present

## 2017-08-09 DIAGNOSIS — Z0000 Encounter for general adult medical examination without abnormal findings: Secondary | ICD-10-CM | POA: Diagnosis not present

## 2017-08-09 DIAGNOSIS — R0989 Other specified symptoms and signs involving the circulatory and respiratory systems: Secondary | ICD-10-CM | POA: Diagnosis not present

## 2017-08-09 DIAGNOSIS — E1165 Type 2 diabetes mellitus with hyperglycemia: Secondary | ICD-10-CM | POA: Diagnosis not present

## 2017-08-09 LAB — CBC AND DIFFERENTIAL
HCT: 41 (ref 41–53)
Hemoglobin: 14.1 (ref 13.5–17.5)
NEUTROS ABS: 4
PLATELETS: 197 (ref 150–399)
WBC: 4.9

## 2017-08-09 LAB — BASIC METABOLIC PANEL
BUN: 5 (ref 4–21)
Creatinine: 1 (ref 0.6–1.3)
Glucose: 110
Potassium: 4 (ref 3.4–5.3)
Sodium: 137 (ref 137–147)

## 2017-08-09 LAB — LIPID PANEL
CHOLESTEROL: 219 — AB (ref 0–200)
HDL: 41 (ref 35–70)
LDL CALC: 126
LDL/HDL RATIO: 5.3
Triglycerides: 261 — AB (ref 40–160)

## 2017-08-09 LAB — PSA: PSA: 1.35

## 2017-08-09 LAB — HEMOGLOBIN A1C: Hemoglobin A1C: 6

## 2017-08-09 LAB — TSH: TSH: 4.22 (ref 0.41–5.90)

## 2017-09-17 ENCOUNTER — Ambulatory Visit: Payer: Medicare HMO | Admitting: Neurology

## 2017-09-17 ENCOUNTER — Encounter: Payer: Self-pay | Admitting: Neurology

## 2017-09-17 ENCOUNTER — Encounter (INDEPENDENT_AMBULATORY_CARE_PROVIDER_SITE_OTHER): Payer: Self-pay

## 2017-09-17 VITALS — BP 140/83 | HR 71 | Wt 265.0 lb

## 2017-09-17 DIAGNOSIS — G4719 Other hypersomnia: Secondary | ICD-10-CM

## 2017-09-17 DIAGNOSIS — F339 Major depressive disorder, recurrent, unspecified: Secondary | ICD-10-CM

## 2017-09-17 DIAGNOSIS — E538 Deficiency of other specified B group vitamins: Secondary | ICD-10-CM | POA: Diagnosis not present

## 2017-09-17 DIAGNOSIS — R413 Other amnesia: Secondary | ICD-10-CM

## 2017-09-17 NOTE — Patient Instructions (Signed)
MRI brain Labs Dr. Eino Farber   Major Depressive Disorder, Adult Major depressive disorder (MDD) is a mental health condition. MDD often makes you feel sad, hopeless, or helpless. MDD can also cause symptoms in your body. MDD can affect your:  Work.  School.  Relationships.  Other normal activities.  MDD can range from mild to very bad. It may occur once (single episode MDD). It can also occur many times (recurrent MDD). The main symptoms of MDD often include:  Feeling sad, depressed, or irritable most of the time.  Loss of interest.  MDD symptoms also include:  Sleeping too much or too little.  Eating too much or too little.  A change in your weight.  Feeling tired (fatigue) or having low energy.  Feeling worthless.  Feeling guilty.  Trouble making decisions.  Trouble thinking clearly.  Thoughts of suicide or harming others.  Feeling weak.  Feeling agitated.  Keeping yourself from being around other people (isolation).  Follow these instructions at home: Activity  Do these things as told by your doctor: ? Go back to your normal activities. ? Exercise regularly. ? Spend time outdoors. Alcohol  Talk with your doctor about how alcohol can affect your antidepressant medicines.  Do not drink alcohol. Or, limit how much alcohol you drink. ? This means no more than 1 drink a day for nonpregnant women and 2 drinks a day for men. One drink equals one of these:  12 oz of beer.  5 oz of wine.  1 oz of hard liquor. General instructions  Take over-the-counter and prescription medicines only as told by your doctor.  Eat a healthy diet.  Get plenty of sleep.  Find activities that you enjoy. Make time to do them.  Think about joining a support group. Your doctor may be able to suggest a group for you.  Keep all follow-up visits as told by your doctor. This is important. Where to find more information:  [employer] on Mental  Illness: ? www.nami.Hope: ? https://carter.com/  National Suicide Prevention Lifeline: ? 413-400-7582. This is free, 24-hour help. Contact a doctor if:  Your symptoms get worse.  You have new symptoms. Get help right away if:  You self-harm.  You see, hear, taste, smell, or feel things that are not present (hallucinate). If you ever feel like you may hurt yourself or others, or have thoughts about taking your own life, get help right away. You can go to your nearest emergency department or call:  Your local emergency services (911 in the [place]  A suicide crisis helpline, such as the National Suicide Prevention Lifeline: ? (405) 602-4047. This is open 24 hours a day.  This information is not intended to replace advice given to you by your health care provider. Make sure you discuss any questions you have with your health care provider. Document Released: 08/08/2015 Document Revised: 05/13/2016 Document Reviewed: 05/13/2016 Elsevier Interactive Patient Education  2017 [employer].

## 2017-09-17 NOTE — Progress Notes (Signed)
[facility]    Provider:  Dr Jaynee Eagles Referring Provider: Josetta Huddle, MD Primary Care Physician:  Josetta Huddle, MD  CC:  Memory problems  HPI:  [name] is a 67 y.o. male here as a referral from Dr. Inda Merlin for memory difficulties. PMHx anxiety, HTN, bells palsy, Major Depression, diabetes. Morbid obesity, orthostatic dizziness, low back pain, anxiety.  Wife ishere and provides much information. The last 7-10 months he has been forgetting things, he has an appointment and he will forget it completely. He is having difficulty with his medications. He feels like he is "losing his mind". Some days better than others. Feels his short-term memory is worsening. He keeps thinking about his father who has passed away 68 years, he never knew his dad. Father drowned at the age of 55 with Alzheimers. She started with Alzheimers in her 50s. Worsening over the last 8 months per wife. He has no energy, he wants to sleep all the time. He doesn;t have the energy. 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 Significant depression, untreated until recently.  He is significantly tired during the day, never feels like he has rested sleep.  Reviewed notes, labs and imaging from outside physicians, which showed:  He has been more anxious and depressed lately. Also more confused. MMSE essentially normal. Medical problems over the last year, loose stools, on lorazepam and prozac. He has severe lo wback pain. Tramadol prn.   08/09/2017: Creatinine 0.99, BUN 5. ldl 126, triglycerides 219, tesh 4.22, hgba1c 6.   Review of Systems: Patient complains of symptoms per HPI as well as the following symptoms: depression, insomnia. Pertinent negatives and positives per HPI. All others negative.   Social History   Socioeconomic History  . Marital status: Married    Spouse name: [name]  . Number  of children: 2  . Years of education: Not on file  . Highest education level: Some college, no degree  Social Needs  . Financial resource strain: Not on file  . Food insecurity - worry: Not on file  . Food insecurity - inability: Not on file  . Transportation needs - medical: Not on file  . Transportation needs - non-medical: Not on file  Occupational History  . Not on file  Tobacco Use  . Smoking status: Former Smoker    Last attempt to quit: 02/25/1982    Years since quitting: 35.5  . Smokeless tobacco: Former Systems developer    Quit date: 1990  Substance and Sexual Activity  . Alcohol use: Yes    Comment: occ  . Drug use: No  . Sexual activity: Not on file  Other Topics Concern  . Not on file  Social History Narrative   Lives at home with wife [name]    Family History  Problem Relation Age of Onset  . Dementia Mother     Past Medical History:  Diagnosis Date  . Anxiety   . Colitis   . Depression   . No pertinent past medical history   . Ulcerative colitis [facility])     Past Surgical History:  Procedure Laterality Date  . COLONOSCOPY    . COLONOSCOPY WITH PROPOFOL N/A 07/15/2014   Procedure: COLONOSCOPY WITH PROPOFOL;  Surgeon: [name], MD;  Location: [facility];  Service: Endoscopy;  Laterality: N/A;  . COLONOSCOPY WITH PROPOFOL N/A 08/22/2015   Procedure: COLONOSCOPY WITH PROPOFOL;  Surgeon:  [name], MD;  Location: [facility];  Service: Endoscopy;  Laterality: N/A;  . ORIF ANKLE FRACTURE  02/27/2012   Procedure: OPEN REDUCTION INTERNAL FIXATION (ORIF) ANKLE FRACTURE;  Surgeon: Colin Rhein, MD;  Location: [facility];  Service: Orthopedics;  Laterality: Left;  ORIF left bimalleolar ankle fracture  . SHOULDER SURGERY     LEFT  . TONSILLECTOMY    . UPPER GASTROINTESTINAL ENDOSCOPY      Current Outpatient Medications  Medication Sig Dispense Refill  . aspirin 325 MG tablet Take 325 mg by mouth as needed.    Marland Kitchen azaTHIOprine (IMURAN) 50 MG  tablet Take 200 mg by mouth every morning.     . Chlorpheniramine Maleate (ALLERGY RELIEF PO) Take 10 mg by mouth daily.    . clonazePAM (KLONOPIN) 2 MG tablet Take 2 mg by mouth at bedtime.    . Esomeprazole Magnesium (NEXIUM PO) Take by mouth.    . GuaiFENesin (MUCINEX PO) Take 1 tablet by mouth every 12 (twelve) hours as needed.    . Loperamide HCl (ANTI-DIARRHEAL PO) Take 1 tablet by mouth daily as needed.    Marland Kitchen LORazepam (ATIVAN) 1 MG tablet Take 1-2 mg by mouth 2 (two) times daily as needed for anxiety.     . Pseudoeph-Doxylamine-DM-APAP (NYQUIL PO) Take by mouth as needed.    . traMADol (ULTRAM) 50 MG tablet Take 50 mg by mouth 3 (three) times daily as needed (back pain).      No current facility-administered medications for this visit.     Allergies as of 09/17/2017  . (No Known Allergies)    Vitals: BP 140/83   Pulse 71   Wt 265 lb (120.2 kg)   BMI 41.50 kg/m  Last Weight:  Wt Readings from Last 1 Encounters:  09/17/17 265 lb (120.2 kg)   Last Height:   Ht Readings from Last 1 Encounters:  08/22/15 5' 7"  (1.702 m)    Physical exam: Exam: Gen: NAD, conversant, well nourised, obese, well groomed                     CV: RRR, no MRG. No Carotid Bruits. No peripheral edema, warm, nontender Eyes: Conjunctivae clear without exudates or hemorrhage  Neuro: Detailed Neurologic Exam  Speech:    Speech is normal; fluent and spontaneous with normal comprehension.  Cognition:    The patient is oriented to person, place, and time;     recent and remote memory impaired;     language fluent;     Impaired attention, concentration    fund of knowledge intact Cranial Nerves:    The pupils are equal, round, and reactive to light. Attempted fundoscopic exam could not visualize.  Visual fields are full to finger confrontation. Extraocular movements are intact. Trigeminal sensation is intact and the muscles of mastication are normal. The face is symmetric. The palate elevates in the  midline. Hearing intact. Voice is normal. Shoulder shrug is normal. The tongue has normal motion without fasciculations.   Coordination:    Normal finger to nose and heel to shin. Normal rapid alternating movements.   Gait:    Heel-toe and tandem gait are normal.   Motor Observation:    No asymmetry, no atrophy, and no involuntary movements noted. Tone:    Normal muscle tone.    Posture:    Posture is normal. normal erect    Strength:    Strength is V/V in the upper and lower limbs.  Sensation: intact to LT     Reflex Exam:  DTR's:    Deep tendon reflexes in the upper and lower extremities are symmetrical bilaterally.   Toes:    The toes are downgoing bilaterally.   Clonus:    Clonus is absent.      Assessment/Plan:   67 y.o. male here as a referral from Dr. Inda Merlin for memory difficulties. PMHx anxiety, HTN, bells palsy, Major Depression, diabetes. Morbid obesity, orthostatic dizziness, low back pain, anxiety. Exam non focal.   - He  appears to have significant untreated depression. Its hard to determine memory changes vs pseudodementia. Will send him to [facility] at [facility] to try and treat depression and see him back and follow this  - Patient has untreated depression, anxiety on multiple sedating meds such as benzodiazepines. MMSE was 28/30 recently and now 24/30 which is a quicker decrease than I would suspect in dementia.  Recently started on Prozac. Feel memory changes may be multifactorial due to cognitive aging, mood disorders, polypharmacy.   - ESS 18. He is morbidly obese, very large neck and malampati 4. Excessive fatigue may be due to depression however a sleep evaluation is warranted for OSA  - Will order MRI of the brain and labs.   - If symptoms progressive with treatment of depression,  recommend formal neurocognitive testing, FDG PET Scan if warranted and eval for clinical trials.  Cannot rule out dementia but he is very depressed and this has  not been treated, he talks about his sons, he has poor energy, wants to sleep all the time, decreased concentration, feels sad most days, he cries often, he has pain all over his body.   - Has days where his memory s good, this argues against dementia. However cannot rule dementia out as depression is often comorbid. Also has family history in mother.     F/u after therapy for depression and sleep evaluation.  Orders Placed This Encounter  Procedures  . MR BRAIN WO CONTRAST  . B12 and Folate Panel  . Methylmalonic acid, serum  . RPR  . Ambulatory referral to Sleep Studies      Sarina Ill, MD  [facility] [address] [place], [place] [address]  Phone [phone] Fax [phone]

## 2017-09-18 ENCOUNTER — Encounter: Payer: Self-pay | Admitting: Neurology

## 2017-09-18 ENCOUNTER — Ambulatory Visit: Payer: Medicare HMO | Admitting: Neurology

## 2017-09-18 VITALS — BP 174/91 | HR 80 | Ht 67.0 in | Wt 261.0 lb

## 2017-09-18 DIAGNOSIS — F339 Major depressive disorder, recurrent, unspecified: Secondary | ICD-10-CM | POA: Insufficient documentation

## 2017-09-18 DIAGNOSIS — R419 Unspecified symptoms and signs involving cognitive functions and awareness: Secondary | ICD-10-CM

## 2017-09-18 DIAGNOSIS — R0683 Snoring: Secondary | ICD-10-CM | POA: Diagnosis not present

## 2017-09-18 DIAGNOSIS — Z6841 Body Mass Index (BMI) 40.0 and over, adult: Secondary | ICD-10-CM | POA: Diagnosis not present

## 2017-09-18 DIAGNOSIS — G4719 Other hypersomnia: Secondary | ICD-10-CM

## 2017-09-18 DIAGNOSIS — R0681 Apnea, not elsewhere classified: Secondary | ICD-10-CM

## 2017-09-18 DIAGNOSIS — F322 Major depressive disorder, single episode, severe without psychotic features: Secondary | ICD-10-CM | POA: Diagnosis not present

## 2017-09-18 NOTE — Patient Instructions (Signed)

## 2017-09-18 NOTE — Progress Notes (Signed)
Epworth Sleepiness Scale 0= would never doze 1= slight chance of dozing 2= moderate chance of dozing 3= high chance of dozing  Sitting and reading: 2 Watching TV: 2 Sitting inactive in a public place (ex. Theater or meeting): 2 As a passenger in a car for an hour without a break: 2 Lying down to rest in the afternoon: 2 Sitting and talking to someone: 2 Sitting quietly after lunch (no alcohol): 3 In a car, while stopped in traffic: 3 Total: 18  FSS is 25

## 2017-09-18 NOTE — Progress Notes (Signed)
Subjective:    Patient ID: [name] is a 67 y.o. male.  HPI     Star Age, MD, PhD [facility] [address] [place], [place] [address]  Dear Berta Minor,   I saw your patient, [name], upon your kind request in my clinic today for initial consultation of his sleep disorder, in particular, concern for underlying obstructive sleep apnea. The patient is unaccompanied today. As you know, [name] is a 67 year old right-handed gentleman with an underlying medical history of anxiety, depression, ulcerative colitis, memory loss, low back pain, hypertension, Bell's palsy, diabetes, and morbid obesity with a BMI of over 40, who reports excessive daytime somnolence. His Epworth sleepiness score is 18 out of 24, fatigue score is 25 out of 63. He does not believe he has dozed off at the wheel. His wife has reported to him that he snores and that he also has pauses in his breathing. She has sleep apnea and uses a CPAP machine. He is reluctant to consider coming in for a sleep study but would be willing if his insurance covers him for this and he would be willing to consider CPAP therapy if the need arises. He has occasional restless legs symptoms, no nights night nocturia, no frequent morning headaches but has had rare morning headaches. He had a tonsillectomy as a child. Bedtime is around 11, rise time between 6 and 7. He has his own company, they install outdoor gas appliances. I reviewed your office note from 09/17/2017. He lives with his wife. He quit smoking in 1980 and does not utilize alcohol on a regular basis but does drink significant caffeine on a day-to-day basis including 6-7 bottles of soda, 3-5 glasses of iced tea, is not sure how much water he drinks.   His Past Medical History Is Significant For: Past Medical History:  Diagnosis Date  . Anxiety   . Colitis   . Depression   . No pertinent past medical history   . Ulcerative colitis ([facility])      His Past Surgical History Is Significant For: Past Surgical History:  Procedure Laterality Date  . COLONOSCOPY    . COLONOSCOPY WITH PROPOFOL N/A 07/15/2014   Procedure: COLONOSCOPY WITH PROPOFOL;  Surgeon: [name], MD;  [facility]: [facility];  Service: Endoscopy;  Laterality: N/A;  . COLONOSCOPY WITH PROPOFOL N/A 08/22/2015   Procedure: COLONOSCOPY WITH PROPOFOL;  Surgeon: [name], MD;  Location: [facility];  Service: Endoscopy;  Laterality: N/A;  . ORIF ANKLE FRACTURE  02/27/2012   Procedure: OPEN REDUCTION INTERNAL FIXATION (ORIF) ANKLE FRACTURE;  Surgeon: Colin Rhein, MD;  Location: [facility];  Service: Orthopedics;  Laterality: Left;  ORIF left bimalleolar ankle fracture  . SHOULDER SURGERY     LEFT  . TONSILLECTOMY    . UPPER GASTROINTESTINAL ENDOSCOPY      His Family History Is Significant For: Family History  Problem Relation Age of Onset  . Dementia Mother     His Social History Is Significant For: Social History   Socioeconomic History  . Marital status: Married    Spouse name: [name]  . Number of children: 2  . Years of education: None  . Highest education level: Some college, no degree  Social Needs  . Financial resource strain: None  . Food insecurity - worry: None  . Food insecurity - inability: None  . Transportation needs - medical: None  . Transportation needs - non-medical: None  Occupational History  . None  Tobacco Use  . Smoking status: Former Smoker    Last attempt to quit: 02/25/1982    Years since quitting: 35.5  . Smokeless tobacco: Former Systems developer    Quit date: 1990  Substance and Sexual Activity  . Alcohol use: Yes    Comment: occ  . Drug use: No  . Sexual activity: None  Other Topics Concern  . None  Social History Narrative   Lives at home with wife [name]    His Allergies Are:  No Known Allergies:   His Current Medications Are:  Outpatient Encounter Medications as of 09/18/2017  Medication  Sig  . aspirin 325 MG tablet Take 325 mg by mouth as needed.  Marland Kitchen azaTHIOprine (IMURAN) 50 MG tablet Take 200 mg by mouth every morning.   . Chlorpheniramine Maleate (ALLERGY RELIEF PO) Take 10 mg by mouth daily.  . clonazePAM (KLONOPIN) 2 MG tablet Take 2 mg by mouth at bedtime.  . Esomeprazole Magnesium (NEXIUM PO) Take by mouth.  . GuaiFENesin (MUCINEX PO) Take 1 tablet by mouth every 12 (twelve) hours as needed.  . Loperamide HCl (ANTI-DIARRHEAL PO) Take 1 tablet by mouth daily as needed.  Marland Kitchen LORazepam (ATIVAN) 1 MG tablet Take 1-2 mg by mouth 2 (two) times daily as needed for anxiety.   . Pseudoeph-Doxylamine-DM-APAP (NYQUIL PO) Take by mouth as needed.  . traMADol (ULTRAM) 50 MG tablet Take 50 mg by mouth 3 (three) times daily as needed (back pain).    No facility-administered encounter medications on file as of 09/18/2017.   :  Review of Systems:  Out of a complete 14 point review of systems, all are reviewed and negative with the exception of these symptoms as listed below:  Review of Systems  Neurological:       Pt presents today to discuss his sleep. Pt has never had a sleep study and does not know if he snores.  Epworth Sleepiness Scale 0= would never doze 1= slight chance of dozing 2= moderate chance of dozing 3= high chance of dozing  Sitting and reading: 2 Watching TV: 2 Sitting inactive in a public place (ex. Theater or meeting): 2 As a passenger in a car for an hour without a break: 2 Lying down to rest in the afternoon: 2  Sitting and talking to someone: 2 Sitting quietly after lunch (no alcohol): 3 In a car, while stopped in traffic: 3 Total: 18     Objective:  Neurological Exam  Physical Exam Physical Examination:   Vitals:   09/18/17 1051  BP: (!) 174/91  Pulse: 80    General Examination: The patient is a very pleasant 67 y.o. male in no acute distress. He appears well-developed and well-nourished and adequately groomed.   HEENT: Normocephalic,  atraumatic, pupils are equal, round and reactive to light and accommodation. Extraocular tracking is good without limitation to gaze excursion or nystagmus noted. Normal smooth pursuit is noted. Hearing is grossly intact. Face is symmetric with normal facial animation and normal facial sensation. Speech is clear with no dysarthria noted. There is no hypophonia. There is no lip, neck/head, jaw or voice tremor. Neck is supple with full range of passive and active motion. There are no carotid bruits on auscultation. Oropharynx exam reveals: mild mouth dryness, adequate dental hygiene with dentures on top, moderate airway crowding due to smaller airway and redundant soft palate, large uvula. Mallampati is class III. Neck circumference is 20-3/8 inches.  tonsils are absent. Tongue protrudes  centrally and palate elevates symmetrically.   Chest: Clear to auscultation without wheezing, rhonchi or crackles noted.  Heart: S1+S2+0, regular and normal without murmurs, rubs or gallops noted.   Abdomen: Soft, non-tender and non-distended with normal bowel sounds appreciated on auscultation.  Extremities: There is no pitting edema in the distal lower extremities bilaterally. Pedal pulses are intact.  Skin: Warm and dry without trophic changes noted.  Musculoskeletal: exam reveals no obvious joint deformities, tenderness or joint swelling or erythema.   Neurologically:  Mental status: The patient is awake, alert and oriented in all 4 spheres. He is able to provide his history, but not very elaborate in his answers.  Cranial nerves II - XII are as described above under HEENT exam. In addition: shoulder shrug is normal with equal shoulder height noted. Motor exam: Normal bulk, strength and tone is noted. There is no drift, tremor or rebound. Romberg is negative. Reflexes are 1+ throughout. Fine motor skills and coordination: grossly intact.   Cerebellar testing: No dysmetria or intention tremor. There is no truncal  or gait ataxia.  Sensory exam: intact to light touch in the upper and lower extremities.  Gait, station and balance: He stands without difficulty, posture is age-appropriate. Tandem walk is doable. Gait is unremarkable.  Assessment and Plan:  In summary, KEDDRICK WYNE is a very pleasant 67 y.o.-year old male with an underlying medical history of anxiety, depression, ulcerative colitis, memory loss, low back pain, hypertension, Bell's palsy, diabetes, and morbid obesity with a BMI of over 40, whose history and physical exam are concerning for obstructive sleep apnea (OSA). I had a long chat with the patient about my findings and the diagnosis of OSA, its prognosis and treatment options. We talked about medical treatments, surgical interventions and non-pharmacological approaches. I explained in particular the risks and ramifications of untreated moderate to severe OSA, especially with respect to developing cardiovascular disease down the Road, including congestive heart failure, difficult to treat hypertension, cardiac arrhythmias, or stroke. Even type 2 diabetes has, in part, been linked to untreated OSA. Symptoms of untreated OSA include daytime sleepiness, memory problems, mood irritability and mood disorder such as depression and anxiety, lack of energy, as well as recurrent headaches, especially morning headaches. We talked about trying to maintain a healthy lifestyle in general, as well as the importance of weight control. I encouraged the patient to eat healthy, exercise daily and keep well hydrated, to keep a scheduled bedtime and wake time routine, to not skip any meals and eat healthy snacks in between meals. I advised the patient not to drive when feeling sleepy. I recommended the following at this time: sleep study with potential positive airway pressure titration. (We will score hypopneas at 4%).   I explained the sleep test procedure to the patient and also outlined possible surgical and  non-surgical treatment options of OSA, including the use of CPAP.  I answered all his questions today and the patient was in agreement. I will likely see him back after the sleep study is completed and encouraged him to call with any interim questions, concerns, problems or updates.   Thank you very much for allowing me to participate in the care of this nice patient. If I can be of any further assistance to you please do not hesitate to talk to me.  Sincerely,   Star Age, MD, PhD

## 2017-09-19 LAB — METHYLMALONIC ACID, SERUM: METHYLMALONIC ACID: 64 nmol/L (ref 0–378)

## 2017-09-19 LAB — B12 AND FOLATE PANEL
Folate: 6.8 ng/mL (ref >3.0)
VITAMIN B 12: 507 pg/mL (ref 232–1245)

## 2017-09-19 LAB — RPR: RPR Ser Ql: NONREACTIVE

## 2017-09-20 ENCOUNTER — Encounter: Payer: Self-pay | Admitting: Neurology

## 2017-09-20 ENCOUNTER — Telehealth: Payer: Self-pay | Admitting: *Deleted

## 2017-09-20 NOTE — Addendum Note (Signed)
Addended by: Sarina Ill B on: 09/20/2017 03:45 PM   Modules accepted: Orders

## 2017-09-20 NOTE — Telephone Encounter (Signed)
Called and spoke with pt's [place] (wife). She is aware his labs are normal and will tell the patient. She verbalized appreciation for the call.

## 2017-09-20 NOTE — Telephone Encounter (Signed)
-----   Message from [name], MD sent at 09/19/2017  1:08 PM EST ----- Labs normal thanks

## 2017-09-30 ENCOUNTER — Ambulatory Visit (INDEPENDENT_AMBULATORY_CARE_PROVIDER_SITE_OTHER): Payer: Medicare HMO | Admitting: Neurology

## 2017-09-30 DIAGNOSIS — G4733 Obstructive sleep apnea (adult) (pediatric): Secondary | ICD-10-CM

## 2017-09-30 DIAGNOSIS — Z6841 Body Mass Index (BMI) 40.0 and over, adult: Secondary | ICD-10-CM

## 2017-09-30 DIAGNOSIS — R419 Unspecified symptoms and signs involving cognitive functions and awareness: Secondary | ICD-10-CM

## 2017-09-30 DIAGNOSIS — R0681 Apnea, not elsewhere classified: Secondary | ICD-10-CM

## 2017-09-30 DIAGNOSIS — R0683 Snoring: Secondary | ICD-10-CM

## 2017-09-30 DIAGNOSIS — G4719 Other hypersomnia: Secondary | ICD-10-CM

## 2017-09-30 DIAGNOSIS — R351 Nocturia: Secondary | ICD-10-CM

## 2017-09-30 DIAGNOSIS — G4720 Circadian rhythm sleep disorder, unspecified type: Secondary | ICD-10-CM

## 2017-10-02 ENCOUNTER — Encounter: Payer: Self-pay | Admitting: Neurology

## 2017-10-04 ENCOUNTER — Other Ambulatory Visit: Payer: Self-pay | Admitting: Neurology

## 2017-10-04 ENCOUNTER — Ambulatory Visit
Admission: RE | Admit: 2017-10-04 | Discharge: 2017-10-04 | Disposition: A | Discharge status: Home / Self Care | Payer: Medicare HMO | Source: Ambulatory Visit | Attending: Neurology | Admitting: Neurology

## 2017-10-04 DIAGNOSIS — Z6841 Body Mass Index (BMI) 40.0 and over, adult: Secondary | ICD-10-CM

## 2017-10-04 DIAGNOSIS — G4720 Circadian rhythm sleep disorder, unspecified type: Secondary | ICD-10-CM

## 2017-10-04 DIAGNOSIS — R413 Other amnesia: Secondary | ICD-10-CM

## 2017-10-04 DIAGNOSIS — G4733 Obstructive sleep apnea (adult) (pediatric): Secondary | ICD-10-CM

## 2017-10-04 NOTE — Procedures (Signed)
PATIENT'S NAME:  [name], [name] DOB:      1950-10-30      MR#:    [id number]     DATE OF RECORDING: 09/30/2017 REFERRING M.D.: Dr. Sarina Ill,           PCP: Josetta Huddle, MD Study Performed:   Baseline Polysomnogram HISTORY: 67 year old man with a history of anxiety, depression, ulcerative colitis, memory loss, low back pain, hypertension, Bell's palsy, diabetes, and morbid obesity with a BMI of over 40, who reports snoring and excessive daytime somnolence. His Epworth sleepiness score is 18 out of 24. The patient's weight 261 pounds with a height of 67 (inches), resulting in a BMI of 40.8 kg/m2. The patient's neck circumference measured 20.4 inches.  CURRENT MEDICATIONS: Aspirin, Azathioprine, Chlorpheniramine, Clonazepam, Esomeprazole Magnesium, Guaifenesin, Loperamide, Lorazepam, Nyquil and Tramadol.   PROCEDURE:  This is a multichannel digital polysomnogram utilizing the Somnostar 11.2 system.  Electrodes and sensors were applied and monitored per AASM Specifications.   EEG, EOG, Chin and Limb EMG, were sampled at 200 Hz.  ECG, Snore and Nasal Pressure, Thermal Airflow, Respiratory Effort, CPAP Flow and Pressure, Oximetry was sampled at 50 Hz. Digital video and audio were recorded.      BASELINE STUDY  Lights Out was at 21:57 and Lights On at 05:09.  Total recording time (TRT) was 432 minutes, with a total sleep time (TST) of 259.5 minutes. The patient's sleep latency was 11.5 minutes. REM sleep was absent.   The sleep efficiency was 60.1%, which is reduced.     SLEEP ARCHITECTURE: WASO (Wake after sleep onset) was 159 minutes with severe sleep fragmentation noted.  There were 46.5 minutes in Stage N1, 213 minutes Stage N2, 0 minutes Stage N3 and 0 minutes in Stage REM.  The percentage of Stage N1 was 17.9%, which is markedly increased, Stage N2 was 82.1%, which is markedly increased, Stage N3 was 0% and Stage R (REM sleep) were absent. The arousals were noted as: 49 were spontaneous, 0 were  associated with PLMs, 86 were associated with respiratory events.  Audio and video analysis did not show any abnormal or unusual movements, behaviors, phonations or vocalizations. The patient took 3 bathroom breaks. Mild to moderate snoring was noted. The EKG was in keeping with normal sinus rhythm (NSR).  RESPIRATORY ANALYSIS:  There were a total of 88 respiratory events:  44 obstructive apneas, 0 central apneas and 0 mixed apneas with a total of 44 apneas and an apnea index (AI) of 10.2 /hour. There were 44 hypopneas with a hypopnea index of 10.2 /hour. The patient also had 0 respiratory event related arousals (RERAs).      The total APNEA/HYPOPNEA INDEX (AHI) was 20.3/hour and the total RESPIRATORY DISTURBANCE INDEX was 20.3 /hour.  0 events occurred in REM sleep and 88 events in NREM. The REM AHI was n/a, versus a non-REM AHI of 20.3. The patient spent 224 minutes of total sleep time in the supine position and 36 minutes in non-supine.. The supine AHI was 21.5 versus a non-supine AHI of 13.5.  OXYGEN SATURATION & C02:  The Wake baseline 02 saturation was 94%, with the lowest being 77%. Time spent below 89% saturation equaled 58 minutes.  PERIODIC LIMB MOVEMENTS: The patient had a total of 0 Periodic Limb Movements.  The Periodic Limb Movement (PLM) index was 0 and the PLM Arousal index was 0/hour.  Post-study, the patient indicated that sleep was worse than usual.   IMPRESSION:  1. Obstructive Sleep Apnea (OSA)  2. Nocturia 3. Dysfunctions associated with sleep stages or arousal from sleep  RECOMMENDATIONS:  1. This study demonstrates moderate obstructive sleep apnea, with a total AHI of 20.3/hour and O2 nadir of 77%. Of note, the absence of REM sleep likely underestimates his AHI and O2 nadir. Treatment with positive airway pressure in the form of CPAP is recommended. This will require a full night titration study to optimize therapy. Other treatment options may include avoidance of supine  sleep position along with weight loss, upper airway or jaw surgery in selected patients or the use of an oral appliance in certain patients. ENT evaluation and/or consultation with a maxillofacial surgeon or dentist may be feasible in some instances.    2. Please note that untreated obstructive sleep apnea carries additional perioperative morbidity. Patients with significant obstructive sleep apnea should receive perioperative PAP therapy and the surgeons and particularly the anesthesiologist should be informed of the diagnosis and the severity of the sleep disordered breathing. 3. This study shows severe sleep fragmentation and abnormal sleep stage percentages; these are nonspecific findings and per se do not signify an intrinsic sleep disorder or a cause for the patient's sleep-related symptoms. Causes include (but are not limited to) the first night effect of the sleep study, circadian rhythm disturbances, medication effect or an underlying mood disorder or medical problem.  4. The patient should be cautioned not to drive, work at heights, or operate dangerous or heavy equipment when tired or sleepy. Review and reiteration of good sleep hygiene measures should be pursued with any patient. 5. The patient will be seen in follow-up by Dr. Rexene Alberts at [facility] for discussion of the test results and further management strategies. The referring provider will be notified of the test results.  I certify that I have reviewed the entire raw data recording prior to the issuance of this report in accordance with the Standards of Accreditation of the American Academy of Sleep Medicine (AASM)   Star Age, MD, PhD Diplomat, American Board of Psychiatry and Neurology (Neurology and Sleep Medicine)

## 2017-10-04 NOTE — Progress Notes (Signed)
Patient referred by Dr. Jaynee Eagles, seen by me on 09/18/17, diagnostic PSG on 09/30/17.    Please call and notify the patient that the recent sleep study did confirm the diagnosis of moderate obstructive sleep apnea, with a total AHI of 20.3/hour and O2 nadir of 77%. Of note, the absence of REM sleep likely underestimates his AHI and O2 nadir. I recommend treatment for this in the form of CPAP. This will require a repeat sleep study for proper titration and mask fitting. Please explain to patient and arrange for a CPAP titration study. I have placed an order in the chart. Thanks.  Star Age, MD, PhD [facility])

## 2017-10-05 ENCOUNTER — Encounter: Payer: Self-pay | Admitting: Neurology

## 2017-10-08 ENCOUNTER — Telehealth: Payer: Self-pay

## 2017-10-08 NOTE — Telephone Encounter (Signed)
I called pt. I advised pt that Dr. Rexene Alberts reviewed their sleep study results and found that pt has moderate osa with an O2 nadir of 77% and recommends that pt be treated with a cpap. Pt did not enter REM sleep which likely underestimates his AHI and O2 nadir. Dr. Rexene Alberts recommends that pt return for a repeat sleep study in order to properly titrate the cpap and ensure a good mask fit. Pt is agreeable to returning for a titration study. I advised pt that our sleep lab will file with pt's insurance and call pt to schedule the sleep study when we hear back from the pt's insurance regarding coverage of this sleep study. Pt verbalized understanding of results. Pt had no questions at this time but was encouraged to call back if questions arise.

## 2017-10-08 NOTE — Telephone Encounter (Signed)
I called pt to discuss his sleep study results. Pt's wife answered but pt does not have a DPR on file. I advised pt's wife that I will be unable to talk to her about results unless pt has a signed DPR with her on file. Pt's wife will ask pt to call me back.

## 2017-10-08 NOTE — Telephone Encounter (Signed)
-----   Message from Star Age, MD sent at 10/04/2017 12:23 PM EST ----- Patient referred by Dr. Jaynee Eagles, seen by me on 09/18/17, diagnostic PSG on 09/30/17.    Please call and notify the patient that the recent sleep study did confirm the diagnosis of moderate obstructive sleep apnea, with a total AHI of 20.3/hour and O2 nadir of 77%. Of note, the absence of REM sleep likely underestimates his AHI and O2 nadir. I recommend treatment for this in the form of CPAP. This will require a repeat sleep study for proper titration and mask fitting. Please explain to patient and arrange for a CPAP titration study. I have placed an order in the chart. Thanks.  Star Age, MD, PhD [facility])

## 2017-10-11 ENCOUNTER — Telehealth: Payer: Self-pay | Admitting: *Deleted

## 2017-10-11 NOTE — Telephone Encounter (Signed)
Attempted to call patient. Unable to reach. LVM asking for call back. Advised office closed and will reopen Monday but able to reach on-call MD if needed. Left office number.

## 2017-10-11 NOTE — Telephone Encounter (Signed)
-----   Message from [name], MD sent at 10/08/2017  1:09 PM EST ----- MRI of the brain does not show any strokes or masses or reversible causes of dementia. There has been some volume loss in the areas of the brain we associate with memory however this can happen with normal aging. I would like to order another scan, a CT scan to see if these areas of the brain are working properly. It is an FDG PET scan. If they are willing we can order. This scan is very sensitive for dementia. I also recommend going forward with the sleep evaluation as well as the formal neurocognitive testing. thanks

## 2017-10-14 ENCOUNTER — Telehealth: Payer: Self-pay | Admitting: Neurology

## 2017-10-14 NOTE — Telephone Encounter (Signed)
Noted, thanks!

## 2017-10-14 NOTE — Telephone Encounter (Signed)
Pt called and stated that he is not ready to schedule his cpap at this time due to being out of work, does not know when he will be able to schedule.

## 2017-10-14 NOTE — Telephone Encounter (Signed)
Called to discuss MRI results. Pt's wife answered. Unfortunately no DPR on file for patient. The wife is aware and will have the patient call our office.

## 2017-10-14 NOTE — Telephone Encounter (Signed)
Patient returned call. I discussed the following in detail with him:   Dr. Jaynee Eagles states that the MRI of your brain does not show any strokes or masses or reversible causes of dementia. There has been some volume loss in the areas of the brain we associate with memory however this can happen with normal aging. Dr. Jaynee Eagles would like to order another scan, a CT scan to see if these areas of the brain are working properly. It is an FDG PET scan. If you are willing we can order. This scan is very sensitive for dementia. Dr. Jaynee Eagles also recommends going forward with the sleep evaluation as well as the formal neurocognitive testing.  The patient stated that he already saw Dr. Rexene Alberts. He agrees to have the FDG PET scan but is concerned about it working out with his insurance. I discussed that he would know if there were any issues. In regards to the formal neurocognitive testing, he was also concerned about the cost involved with the referral. I advised he could call his insurance company to inquire about this. He stated he wants to "stick with what we've got going on right now".

## 2017-10-15 ENCOUNTER — Other Ambulatory Visit: Payer: Self-pay | Admitting: Neurology

## 2017-10-15 DIAGNOSIS — F0391 Unspecified dementia with behavioral disturbance: Secondary | ICD-10-CM

## 2017-10-15 DIAGNOSIS — G309 Alzheimer's disease, unspecified: Secondary | ICD-10-CM

## 2017-10-15 NOTE — Telephone Encounter (Signed)
I ordered the PET scan, putting Hinton Dyer on here because I think she has to schedule it.

## 2017-10-17 ENCOUNTER — Encounter: Payer: Self-pay | Admitting: Neurology

## 2017-10-17 NOTE — Telephone Encounter (Signed)
I have talked  to patient's wife PET scan is scheduled for 10/25/2017 arrive at 11:30 am at [place] 6 hours before . Patient's wife understands all details . Patient's wife has the number . [phone].   Dr. Jaynee Eagles Patient's wife sent you a message you can disregard that message .

## 2017-10-25 ENCOUNTER — Ambulatory Visit (HOSPITAL_COMMUNITY)
Admission: RE | Admit: 2017-10-25 | Discharge: 2017-10-25 | Disposition: A | Discharge status: Home / Self Care | Payer: Medicare HMO | Source: Ambulatory Visit | Attending: Neurology | Admitting: Neurology

## 2017-10-25 DIAGNOSIS — G309 Alzheimer's disease, unspecified: Secondary | ICD-10-CM

## 2017-10-25 DIAGNOSIS — F0391 Unspecified dementia with behavioral disturbance: Secondary | ICD-10-CM

## 2017-10-25 MED ORDER — FLUDEOXYGLUCOSE F - 18 (FDG) INJECTION
10.1000 | Freq: Once | INTRAVENOUS | Status: AC | PRN
  Administered 2017-10-25: 10.1 via INTRAVENOUS

## 2017-10-28 ENCOUNTER — Encounter: Payer: Self-pay | Admitting: Neurology

## 2017-10-29 ENCOUNTER — Telehealth: Payer: Self-pay | Admitting: Neurology

## 2017-10-29 ENCOUNTER — Ambulatory Visit: Payer: Medicare HMO | Admitting: Clinical

## 2017-10-29 DIAGNOSIS — F331 Major depressive disorder, recurrent, moderate: Secondary | ICD-10-CM | POA: Diagnosis not present

## 2017-10-29 DIAGNOSIS — F419 Anxiety disorder, unspecified: Secondary | ICD-10-CM | POA: Diagnosis not present

## 2017-10-29 NOTE — Telephone Encounter (Signed)
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 I recommend formal neurocognitive testing with Dr. Si Raider to confirm.  Also still recommend follow up with psychiatry for his mood and behavior problems regardless of whether they are caused by depression/anxiety or mil ddementia or both. Thanks.  1. Mild (very subtle ) decreased relative cortical metabolism within the biparietal and temporal lobes compared to the frontal lobes. This could indicate early Alzheimer's type pathology but is not definitive.

## 2017-10-30 ENCOUNTER — Encounter: Payer: Self-pay | Admitting: *Deleted

## 2017-10-30 ENCOUNTER — Telehealth: Payer: Self-pay | Admitting: Neurology

## 2017-10-30 ENCOUNTER — Other Ambulatory Visit: Payer: Self-pay | Admitting: Neurology

## 2017-10-30 ENCOUNTER — Encounter: Payer: Self-pay | Admitting: Neurology

## 2017-10-30 DIAGNOSIS — F0281 Dementia in other diseases classified elsewhere with behavioral disturbance: Secondary | ICD-10-CM

## 2017-10-30 DIAGNOSIS — G301 Alzheimer's disease with late onset: Principal | ICD-10-CM

## 2017-10-30 MED ORDER — CITALOPRAM HYDROBROMIDE 10 MG PO TABS: 10.0000 mg | ORAL_TABLET | Freq: Every day | ORAL | 6 refills | Status: DC

## 2017-10-30 NOTE — Telephone Encounter (Signed)
Spoke with patient. Discussed the following from [place] re: FDG PET scan  FDG PET scan is concerning for early alzheimers. I recommend formal neurocognitive testing with Dr. Si Raider to confirm.  Also still recommend follow up with psychiatry for his mood and behavior problems regardless of whether they are caused by depression/anxiety or mild dementia or both.   Patient will discuss this with his wife and will likely call back with her on phone. He stated that he went through this with his mother and it was the longest 15 years of his life. He will also let us know what he decides regarding seeing Dr. Bonita Quin. He stated he had already seen Dr. Gaynell Face with [facility].

## 2017-10-30 NOTE — Telephone Encounter (Signed)
Called patient and LVM asking for call back.   Will be discussing FDG Pet Scan results.

## 2017-10-30 NOTE — Telephone Encounter (Signed)
Spoke with wife and patient. Patient has seen a psychiatrist. He has also been on SSRIs in the past. Discussed Citalopram, cardiac side effects, risks of worsening mood and risk of suicidality. Will refer to Dr. Si Raider. The FDG PEt Scan showed possibly mild early alzheimers but not definitie. Wife and patient deny any Will give him a temporary for Lorazepam bc he will run out but he needs to follow with psychiatry to manage medications.   [facility] please print a lorazepam script thanks.

## 2017-10-31 ENCOUNTER — Other Ambulatory Visit: Payer: Self-pay | Admitting: Neurology

## 2017-10-31 ENCOUNTER — Telehealth: Payer: Self-pay | Admitting: Neurology

## 2017-10-31 MED ORDER — LORAZEPAM 1 MG PO TABS: 1.0000 mg | ORAL_TABLET | Freq: Two times a day (BID) | ORAL | 2 refills | Status: DC | PRN

## 2017-10-31 NOTE — Telephone Encounter (Signed)
Ready for MD signature.

## 2017-10-31 NOTE — Telephone Encounter (Signed)
Faxed Ativan prescription to pharmacy. Received a receipt of confirmation.

## 2017-10-31 NOTE — Telephone Encounter (Signed)
I called to make this patient aware that his referral has been sent over to Dr. Si Raider, he did not answer so I left him a message asking him to call back, if he calls back please inform him of this and give him their number. [phone].

## 2017-10-31 NOTE — Telephone Encounter (Signed)
Prescription on the printer thanks

## 2017-11-04 ENCOUNTER — Encounter: Payer: Self-pay | Admitting: Neurology

## 2017-11-05 ENCOUNTER — Other Ambulatory Visit: Payer: Self-pay | Admitting: Neurology

## 2017-11-05 DIAGNOSIS — F339 Major depressive disorder, recurrent, unspecified: Secondary | ICD-10-CM

## 2017-11-08 ENCOUNTER — Encounter: Payer: Self-pay | Admitting: Family Medicine

## 2017-11-08 ENCOUNTER — Ambulatory Visit: Payer: Medicare HMO | Admitting: Clinical

## 2017-11-08 ENCOUNTER — Ambulatory Visit (INDEPENDENT_AMBULATORY_CARE_PROVIDER_SITE_OTHER): Payer: Medicare HMO | Admitting: Family Medicine

## 2017-11-08 VITALS — BP 134/82 | HR 67 | Temp 97.5°F | Ht 67.0 in | Wt 258.1 lb

## 2017-11-08 DIAGNOSIS — K51919 Ulcerative colitis, unspecified with unspecified complications: Secondary | ICD-10-CM | POA: Insufficient documentation

## 2017-11-08 DIAGNOSIS — Z7689 Persons encountering health services in other specified circumstances: Secondary | ICD-10-CM | POA: Diagnosis not present

## 2017-11-08 DIAGNOSIS — F339 Major depressive disorder, recurrent, unspecified: Secondary | ICD-10-CM

## 2017-11-08 DIAGNOSIS — Z6841 Body Mass Index (BMI) 40.0 and over, adult: Secondary | ICD-10-CM

## 2017-11-08 DIAGNOSIS — G4733 Obstructive sleep apnea (adult) (pediatric): Secondary | ICD-10-CM | POA: Diagnosis not present

## 2017-11-08 DIAGNOSIS — K909 Intestinal malabsorption, unspecified: Secondary | ICD-10-CM | POA: Insufficient documentation

## 2017-11-08 DIAGNOSIS — R413 Other amnesia: Secondary | ICD-10-CM

## 2017-11-08 LAB — T4, FREE: Free T4: 0.65 ng/dL (ref 0.60–1.60)

## 2017-11-08 LAB — TSH: TSH: 4.32 u[IU]/mL (ref 0.35–4.50)

## 2017-11-08 LAB — VITAMIN D 25 HYDROXY (VIT D DEFICIENCY, FRACTURES): VITD: 30.29 ng/mL (ref 30.00–100.00)

## 2017-11-08 MED ORDER — FLUOXETINE HCL 20 MG PO TABS: ORAL_TABLET | ORAL | 0 refills | Status: DC

## 2017-11-08 NOTE — Patient Instructions (Addendum)
Start prozac 20 mg a day for 1 week, then increase to 40 mg a day until you see me in 3 weeks.  - Celexa (citalopram) --> if you have not started, do not, but keep medicine, may start later.  Ativan (lorazapam) --> you can take 1 pill every 8 hours only.  STOP: Klonopin (clonazepam)- throw them away. Do not refill.   You need to get the CPAP machine recommended by your neurologist.    We will collect labs today and call you with results.   Follow up in 3 weeks.   Please help [place] help you:  We are honored you have chosen [facility] for your Primary Care home. Below you will find basic instructions that you may need to access in the future. Please help [place] help you by reading the instructions, which cover many of the frequent questions we experience.   Prescription refills and request:  -In order to allow more efficient response time, please call your pharmacy for all refills. They will forward the request electronically to [place]. This allows for the quickest possible response. Request left on a nurse line can take longer to refill, since these are checked as time allows between office patients and other phone calls.  - refill request can take up to 3-5 working days to complete.  - If request is sent electronically and request is appropiate, it is usually completed in 1-2 business days.  - all patients will need to be seen routinely for all chronic medical conditions requiring prescription medications (see follow-up below). If you are overdue for follow up on your condition, you will be asked to make an appointment and we will call in enough medication to cover you until your appointment (up to 30 days).  - all controlled substances will require a face to face visit to request/refill.  - if you desire your prescriptions to go through a new pharmacy, and have an active script at original pharmacy, you will need to call your pharmacy and have scripts transferred to new pharmacy. This is completed  between the pharmacy locations and not by your provider.    Results: If any images or labs were ordered, it can take up to 1 week to get results depending on the test ordered and the lab/facility running and resulting the test. - Normal or stable results, which do not need further discussion, may be released to your mychart immediately with attached note to you. A call may not be generated for normal results. Please make certain to sign up for mychart. If you have questions on how to activate your mychart you can call the front office.  - If your results need further discussion, our office will attempt to contact you via phone, and if unable to reach you after 2 attempts, we will release your abnormal result to your mychart with instructions.  - All results will be automatically released in mychart after 1 week.  - Your provider will provide you with explanation and instruction on all relevant material in your results. Please keep in mind, results and labs may appear confusing or abnormal to the untrained eye, but it does not mean they are actually abnormal for you personally. If you have any questions about your results that are not covered, or you desire more detailed explanation than what was provided, you should make an appointment with your provider to do so.   Our office handles many outgoing and incoming calls daily. If we have not contacted you  within 1 week about your results, please check your mychart to see if there is a message first and if not, then contact our office.  In helping with this matter, you help decrease call volume, and therefore allow [place] to be able to respond to patients needs more efficiently.   Acute office visits (sick visit):  An acute visit is intended for a new problem and are scheduled in shorter time slots to allow schedule openings for patients with new problems. This is the appropriate visit to discuss a new problem. In order to provide you with excellent quality  medical care with proper time for you to explain your problem, have an exam and receive treatment with instructions, these appointments should be limited to one new problem per visit. If you experience a new problem, in which you desire to be addressed, please make an acute office visit, we save openings on the schedule to accommodate you. Please do not save your new problem for any other type of visit, let us take care of it properly and quickly for you.   Follow up visits:  Depending on your condition(s) your provider will need to see you routinely in order to provide you with quality care and prescribe medication(s). Most chronic conditions (Example: hypertension, Diabetes, depression/anxiety... etc), require visits a couple times a year. Your provider will instruct you on proper follow up for your personal medical conditions and history. Please make certain to make follow up appointments for your condition as instructed. Failing to do so could result in lapse in your medication treatment/refills. If you request a refill, and are overdue to be seen on a condition, we will always provide you with a 30 day script (once) to allow you time to schedule.    Medicare wellness (well visit): - we have a wonderful Nurse Maudie Mercury), that will meet with you and provide you will yearly medicare wellness visits. These visits should occur yearly (can not be scheduled less than 1 calendar year apart) and cover preventive health, immunizations, advance directives and screenings you are entitled to yearly through your medicare benefits. Do not miss out on your entitled benefits, this is when medicare will pay for these benefits to be ordered for you.  These are strongly encouraged by your provider and is the appropriate type of visit to make certain you are up to date with all preventive health benefits. If you have not had your medicare wellness exam in the last 12 months, please make certain to schedule one by calling the  office and schedule your medicare wellness with Maudie Mercury as soon as possible.   Yearly physical (well visit):  - Adults are recommended to be seen yearly for physicals. Check with your insurance and date of your last physical, most insurances require one calendar year between physicals. Physicals include all preventive health topics, screenings, medical exam and labs that are appropriate for gender/age and history. You may have fasting labs needed at this visit. This is a well visit (not a sick visit), new problems should not be covered during this visit (see acute visit).  - Pediatric patients are seen more frequently when they are younger. Your provider will advise you on well child visit timing that is appropriate for your their age. - This is not a medicare wellness visit. Medicare wellness exams do not have an exam portion to the visit. Some medicare companies allow for a physical, some do not allow a yearly physical. If your medicare allows a yearly physical you  can schedule the medicare wellness with our nurse Maudie Mercury and have your physical with your provider after, on the same day. Please check with insurance for your full benefits.   Late Policy/No Shows:  - all new patients should arrive 15-30 minutes earlier than appointment to allow [place] time  to  obtain all personal demographics,  insurance information and for you to complete office paperwork. - All established patients should arrive 10-15 minutes earlier than appointment time to update all information and be checked in .  - In our best efforts to run on time, if you are late for your appointment you will be asked to either reschedule or if able, we will work you back into the schedule. There will be a wait time to work you back in the schedule,  depending on availability.  - If you are unable to make it to your appointment as scheduled, please call 24 hours ahead of time to allow [place] to fill the time slot with someone else who needs to be seen. If you  do not cancel your appointment ahead of time, you may be charged a no show fee.

## 2017-11-08 NOTE — Progress Notes (Signed)
Patient ID: [name] Flesher Sr., male  DOB: 02-02-1951, 67 y.o.   MRN: [id number] Patient Care Team    Relationship Specialty Notifications Start End  [name], DO PCP - General Family Medicine  11/08/17     Chief Complaint  Patient presents with  . Establish Care    pt c/o of loss of consciousness for few seconds and can't remember what transpire within that few seconds.    Subjective:  [name] Flesher Sr. is a 67 y.o.  male present for new patient establishment. All past medical history, surgical history, allergies, family history, immunizations, medications and social history were updated and enetered in the electronic medical record today. All recent labs, ED visits and hospitalizations within the last year were reviewed. Patient presents to appointment by himself today.  Memory loss/depression/anxiety/sleep apnea: Patient reports he has been experiencing more memory loss. He gives examples of times in which he has fallen asleep during the day for over an hour while at work. He does not recall falling asleep. He endorses use of multiple benzodiazepines daily both Klonopin and Ativan use. He reports take 1 or 2 (of either medication) when feeling anxious or he can't sleep. He recently had a sleep study with confirm sleep apnea on 10/04/2017. He does not have a CPAP machine as of yet. He reports being on Prozac in the past and this is confirmed through his prior PCP notes. Prozac was never tapered to effective dose, however patient was provided with Klonopin and Ativan at high doses. His neurologist started low-dose Celexa, patient has yet to start medication. By review of his past medical history he has been prescribed Zoloft, which was felt to cause diarrhea and amitriptyline which was discontinued for unknown reasons. He reports he saw Dr. Richrd Sox and was referred to counseling by neuro, but he does not think "talking things out" is helpful for him. Patient had a history of hypertension  on diltiazem. However last PCP notes discontinue diltiazem secondary to normal blood pressures.  Ulcerative colitis/GERD: Patient is established with [facility] gastroenterologist which prescribes azathioprine for his ulcerative colitis. Nexium and loperamide also prescribed through gastroenterology.  Depression screen PHQ 2/9 11/08/2017  Decreased Interest 1  Down, Depressed, Hopeless 2  PHQ - 2 Score 3  Altered sleeping 1  Tired, decreased energy 1  Change in appetite 1  Feeling bad or failure about yourself  1  Trouble concentrating 0  Moving slowly or fidgety/restless 1  Suicidal thoughts 0  PHQ-9 Score 8   GAD 7 : Generalized Anxiety Score 11/08/2017  Nervous, Anxious, on Edge 2  Control/stop worrying 1  Worry too much - different things 1  Trouble relaxing 2  Restless 2  Easily annoyed or irritable 2  Afraid - awful might happen 1  Total GAD 7 Score 11      Current Exercise Habits: Home exercise routine, Type of exercise: Other - see comments, Time (Minutes): 60, Frequency (Times/Week): 3, Weekly Exercise (Minutes/Week): 180, Intensity: Mild   Fall Risk  11/08/2017 11/08/2017  Falls in the past year? Yes Yes  Number falls in past yr: 2 or more 2 or more  Injury with Fall? Yes Yes  Comment few scratches few scratches     Immunization History  Administered Date(s) Administered  . Influenza-Unspecified 09/10/2017  . Tdap 03/17/2015    No exam data present  Past Medical History:  Diagnosis Date  . Allergy   . Anxiety   . Chicken pox   .  Depression   . Diet-controlled diabetes mellitus ([place])   . Eating disorder   . GERD (gastroesophageal reflux disease)   . History of vitamin D deficiency   . Hyperlipidemia   . Hypertension   . Ulcerative colitis ([place])    Allergies  Allergen Reactions  . Losartan Potassium-Hctz Diarrhea   Past Surgical History:  Procedure Laterality Date  . COLONOSCOPY WITH PROPOFOL N/A 07/15/2014   Procedure: COLONOSCOPY WITH  PROPOFOL;  Surgeon: [name], MD;  Location: [facility];  Service: Endoscopy;  Laterality: N/A;  . COLONOSCOPY WITH PROPOFOL N/A 08/22/2015   Procedure: COLONOSCOPY WITH PROPOFOL;  Surgeon: [name], MD;  Location: [facility];  Service: Endoscopy;  Laterality: N/A;  . ORIF ANKLE FRACTURE  02/27/2012   Procedure: OPEN REDUCTION INTERNAL FIXATION (ORIF) ANKLE FRACTURE;  Surgeon: Colin Rhein, MD;  Location: [facility];  Service: Orthopedics;  Laterality: Left;  ORIF left bimalleolar ankle fracture  . SHOULDER SURGERY     LEFT  . TONSILLECTOMY    . UPPER GASTROINTESTINAL ENDOSCOPY     Family History  Problem Relation Age of Onset  . Dementia Mother   . Hypertension Mother   . Early death Father 80       drowning   . Post-traumatic stress disorder Brother   . Neuropathy Brother    Social History   Socioeconomic History  . Marital status: Married    Spouse name: [name]  . Number of children: 2  . Years of education: Not on file  . Highest education level: Some college, no degree  Social Needs  . Financial resource strain: Not on file  . Food insecurity - worry: Not on file  . Food insecurity - inability: Not on file  . Transportation needs - medical: Not on file  . Transportation needs - non-medical: Not on file  Occupational History  . Not on file  Tobacco Use  . Smoking status: Former Smoker    Last attempt to quit: 02/25/1982    Years since quitting: 35.7  . Smokeless tobacco: Former Systems developer    Quit date: 1990  Substance and Sexual Activity  . Alcohol use: Yes    Comment: occ  . Drug use: No  . Sexual activity: No  Other Topics Concern  . Not on file  Social History Narrative   Lives at home with wife [name].   College-educated. Works in Public house manager.    Allergies as of 11/08/2017      Reactions   Losartan Potassium-hctz Diarrhea      Medication List        Accurate as of 11/08/17  7:22 PM. Always use your most  recent med list.          ALLERGY RELIEF PO Take 10 mg by mouth daily.   ANTI-DIARRHEAL PO Take 1 tablet by mouth daily as needed.   aspirin 325 MG tablet Take 325 mg by mouth as needed.   azaTHIOprine 50 MG tablet Commonly known as:  IMURAN Take 200 mg by mouth every morning.   citalopram 10 MG tablet Commonly known as:  CELEXA Take 1 tablet (10 mg total) by mouth daily.   FLUoxetine 20 MG tablet Commonly known as:  PROZAC Take 1 tablet (20 mg total) by mouth daily for 7 days, THEN 2 tablets (40 mg total) daily for 23 days. Start taking on:  11/08/2017   LORazepam 1 MG tablet Commonly known as:  ATIVAN Take 1-2 tablets (1-2 mg  total) by mouth 2 (two) times daily as needed for anxiety.   MUCINEX PO Take 1 tablet by mouth every 12 (twelve) hours as needed.   NEXIUM PO Take by mouth.   traMADol 50 MG tablet Commonly known as:  ULTRAM Take 50 mg by mouth 3 (three) times daily as needed (back pain).       All past medical history, surgical history, allergies, family history, immunizations andmedications were updated in the EMR today and reviewed under the history and medication portions of their EMR.    Recent Results (from the past 2160 hour(s))  B12 and Folate Panel     Status: None   Collection Time: 09/17/17  4:52 PM  Result Value Ref Range   Vitamin B-12 507 232 - 1,245 pg/mL   Folate 6.8 >3.0 ng/mL    Comment: A serum folate concentration of less than 3.1 ng/mL is considered to represent clinical deficiency.   Methylmalonic acid, serum     Status: None   Collection Time: 09/17/17  4:52 PM  Result Value Ref Range   Methylmalonic Acid 64 0 - 378 nmol/L   Disclaimer: Comment     Comment: This test was developed and its performance characteristics determined by [facility]. It has not been cleared or approved by the Food and Drug Administration.   RPR     Status: None   Collection Time: 09/17/17  4:52 PM  Result Value Ref Range   RPR Ser Ql Non Reactive Non  Reactive  VITAMIN D 25 Hydroxy (Vit-D Deficiency, Fractures)     Status: None   Collection Time: 11/08/17 12:20 PM  Result Value Ref Range   VITD 30.29 30.00 - 100.00 ng/mL  TSH     Status: None   Collection Time: 11/08/17 12:20 PM  Result Value Ref Range   TSH 4.32 0.35 - 4.50 uIU/mL  T4, free     Status: None   Collection Time: 11/08/17 12:20 PM  Result Value Ref Range   Free T4 0.65 0.60 - 1.60 ng/dL    Comment: Specimens from patients who are undergoing biotin therapy and /or ingesting biotin supplements may contain high levels of biotin.  The higher biotin concentration in these specimens interferes with this Free T4 assay.  Specimens that contain high levels  of biotin may cause false high results for this Free T4 assay.  Please interpret results in light of the total clinical presentation of the patient.      Nm Pet Metabolic Brain  Result Date: 10/25/2017 CLINICAL DATA:  Alzheimer's disease. EXAM: NM PET METABOLIC BRAIN TECHNIQUE: 10.1 mCi F-18 FDG was injected intravenously via the antecubital fossa. Full-ring PET imaging was performed from the vertex to the skull base. CT data was obtained and used for attenuation correction and anatomic localization. COMPARISON:  Brain MRI 10/04/2017 FINDINGS: There is very subtle decreased by parietal cortical metabolism compared to the frontal lobe cortical metabolism. Normal relative metabolism in the occipital lobes. Mild decreased relative cortical metabolism in the temporal lobes compared to the frontal lobes. Mild generalized atrophy on CT exam. IMPRESSION: 1. Mild (very subtle ) decreased relative cortical metabolism within the biparietal and temporal lobes compared to the frontal lobes. This could indicate early Alzheimer's type pathology but is not definitive. 2. Normal cortical metabolism within the occipital lobe. Electronically Signed   By: Suzy Bouchard M.D.   On: 10/25/2017 15:22     ROS: 14 pt review of systems performed and  negative (unless mentioned in an HPI)  Objective: BP 134/82 (BP Location: Left Arm, Patient Position: Sitting, Cuff Size: Large)   Pulse 67   Temp (!) 97.5 F (36.4 C) (Oral)   Ht 5' 7"  (1.702 m)   Wt 258 lb 1.9 oz (117.1 kg)   SpO2 95%   BMI 40.43 kg/m  Gen: Afebrile. No acute distress. Nontoxic in appearance, well-developed, well-nourished,  pleasant, Caucasian male, obese. HENT: AT. [place]. MMM, no oral lesions Eyes:Pupils Equal Round Reactive to light, Extraocular movements intact,  Conjunctiva without redness, discharge or icterus. Neck/lymp/endocrine: Supple, no lymphadenopathy, no thyromegaly CV: RRR no murmur, no edema, +2/4 P posterior tibialis pulses. Chest: CTAB, no wheeze, rhonchi or crackles.  Abd: Soft. Obese. NTND. BS present. No Masses palpated. Skin:  Warm and well-perfused. Skin intact. Neuro/Msk: Normal gait. PERLA. EOMi. Alert. Oriented x3.  Psych: Normal affect, dress and demeanor. Normal speech. Normal thought content and judgment.  Assessment/plan: [name] Flesher Sr. is a 67 y.o. male present for establishment of care with multiple chronic conditions and complaints of memory loss. Memory loss - Reviewed prior records available. Reviewed neurology notes, labs and imaging. Discussed his memory loss with him today and given his multiple sedating medications prescribed to him, I would suspect polypharmacy has main cause of his memory loss. Although early Alzheimer's cannot be ruled out with his brain image results. In addition he suffers from major depression which is under treated and sleep apnea which is not treated currently. - Strongly encouraged patient to follow-up for his CPAP machine through neurology - VITAMIN D 25 Hydroxy (Vit-D Deficiency, Fractures) - TSH - T4, free  Major depression, recurrent, chronic ([place]) - Patient has a very confusing medical history surrounding his depression. Reviewed prior PCP notes and discussed with patient all the multiple  medications she has been on. She does not seem to have knowledge as to what medications are started or current. Patients pharmacy will be contacted to assure most recent medication list is up-to-date. - Patient is to discontinue Klonopin. - He can continue Ativan through his prior prescription at 1 mg every 8 hours. Patient is overusing his medications. - He never started the Celexa. - Start Prozac taper up to 40 mg daily. This is the last known medication through prior PCP that was effective but never titrated. - could consider trazodone for night time use in place of benzo.  - VITAMIN D 25 Hydroxy (Vit-D Deficiency, Fractures) - TSH - T4, free Follow-up 3 weeks  Ulcerative colitis with complication, unspecified location (HCC)/Intestinal malabsorption, unspecified type - Loperamide, Nexium, Imuran prescribed through GI - VITAMIN D 25 Hydroxy (Vit-D Deficiency, Fractures) - TSH - T4, free  Moderate obstructive sleep apnea Patient recently had a sleep apnea study completed and is in need of a CPAP machine. Encouraged him to touch base with his neurologist to make sure machine/supplies have been ordered.   Return in about 3 weeks (around 11/29/2017).  Greater than 45 minutes was spent with patient, greater than 50% of that time was spent face-to-face with patient counseling and coordinating care.    Note is dictated utilizing voice recognition software. Although note has been proof read prior to signing, occasional typographical errors still can be missed. If any questions arise, please do not hesitate to call for verification.  Electronically signed by: Howard Pouch, DO [place]

## 2017-11-11 ENCOUNTER — Telehealth: Payer: Self-pay | Admitting: Family Medicine

## 2017-11-11 ENCOUNTER — Encounter: Payer: Self-pay | Admitting: Family Medicine

## 2017-11-11 NOTE — Telephone Encounter (Signed)
Please call pt: - his thyroid labs are normal.  - his vit d is low normal. I would like him to add a daily OTC vit d 800 u supplement. - Please ask him if he is taking the diltiazem for BP?  - Also please have him bring all the medication bottles he is TAKING daily to his next appt. Which was to be scheduled in 3 weeks.

## 2017-11-11 NOTE — Telephone Encounter (Signed)
Spoke with patient reviewed lab results and instructions. Patient verbalized understanding. Patient states he is not taking anything for his BP .

## 2017-11-13 ENCOUNTER — Telehealth: Payer: Self-pay | Admitting: *Deleted

## 2017-11-13 NOTE — Telephone Encounter (Signed)
Received letter from [facility] that pt was given a temporary supply of Lorazepam 1 mg, however there is a quantity limit. RN checked [employer] website, quantity limit appears to be 90 tablets for 30 days. [facility], they stated that current prescription of 30 tabs for 30 days is available and does not need authorization.

## 2017-11-29 ENCOUNTER — Telehealth: Payer: Self-pay | Admitting: Family Medicine

## 2017-11-29 ENCOUNTER — Ambulatory Visit (INDEPENDENT_AMBULATORY_CARE_PROVIDER_SITE_OTHER): Payer: Medicare HMO | Admitting: Family Medicine

## 2017-11-29 ENCOUNTER — Encounter: Payer: Self-pay | Admitting: Family Medicine

## 2017-11-29 VITALS — BP 138/82 | HR 80 | Temp 98.1°F | Resp 20 | Ht 67.0 in | Wt 250.0 lb

## 2017-11-29 DIAGNOSIS — G4733 Obstructive sleep apnea (adult) (pediatric): Secondary | ICD-10-CM | POA: Diagnosis not present

## 2017-11-29 DIAGNOSIS — K51919 Ulcerative colitis, unspecified with unspecified complications: Secondary | ICD-10-CM

## 2017-11-29 DIAGNOSIS — R413 Other amnesia: Secondary | ICD-10-CM | POA: Diagnosis not present

## 2017-11-29 DIAGNOSIS — F339 Major depressive disorder, recurrent, unspecified: Secondary | ICD-10-CM

## 2017-11-29 DIAGNOSIS — G4700 Insomnia, unspecified: Secondary | ICD-10-CM

## 2017-11-29 DIAGNOSIS — I10 Essential (primary) hypertension: Secondary | ICD-10-CM

## 2017-11-29 DIAGNOSIS — F1320 Sedative, hypnotic or anxiolytic dependence, uncomplicated: Secondary | ICD-10-CM

## 2017-11-29 DIAGNOSIS — Z79899 Other long term (current) drug therapy: Secondary | ICD-10-CM

## 2017-11-29 DIAGNOSIS — Z0289 Encounter for other administrative examinations: Secondary | ICD-10-CM

## 2017-11-29 HISTORY — DX: Essential (primary) hypertension: I10

## 2017-11-29 LAB — COMPREHENSIVE METABOLIC PANEL
ALT: 23 U/L (ref 0–53)
AST: 29 U/L (ref 0–37)
Albumin: 4.4 g/dL (ref 3.5–5.2)
Alkaline Phosphatase: 67 U/L (ref 39–117)
BUN: 5 mg/dL — AB (ref 6–23)
CHLORIDE: 97 meq/L (ref 96–112)
CO2: 30 mEq/L (ref 19–32)
Calcium: 9.6 mg/dL (ref 8.4–10.5)
Creatinine, Ser: 1.05 mg/dL (ref 0.40–1.50)
GFR: 74.51 mL/min (ref >60.00)
GLUCOSE: 125 mg/dL — AB (ref 70–99)
POTASSIUM: 3.1 meq/L — AB (ref 3.5–5.1)
Sodium: 138 mEq/L (ref 135–145)
Total Bilirubin: 1.4 mg/dL — ABNORMAL HIGH (ref 0.2–1.2)
Total Protein: 6.9 g/dL (ref 6.0–8.3)

## 2017-11-29 MED ORDER — LORAZEPAM 1 MG PO TABS
1.0000 mg | ORAL_TABLET | Freq: Three times a day (TID) | ORAL | 5 refills | Status: DC
Start: 2017-11-29 — End: 2018-05-14

## 2017-11-29 MED ORDER — FLUOXETINE HCL 60 MG PO TABS
60.0000 mg | ORAL_TABLET | Freq: Every day | ORAL | 1 refills | Status: DC
Start: 2017-11-29 — End: 2018-04-07

## 2017-11-29 MED ORDER — AMLODIPINE BESYLATE 2.5 MG PO TABS: 2.5000 mg | ORAL_TABLET | Freq: Every day | ORAL | 0 refills | Status: DC

## 2017-11-29 MED ORDER — TRAZODONE HCL 50 MG PO TABS: 25.0000 mg | ORAL_TABLET | Freq: Every evening | ORAL | 0 refills | Status: DC | PRN

## 2017-11-29 MED ORDER — LOPERAMIDE HCL 2 MG PO TABS: 2.0000 mg | ORAL_TABLET | Freq: Every day | ORAL | 1 refills | Status: DC | PRN

## 2017-11-29 NOTE — Telephone Encounter (Signed)
Please communicate with Dr. Cathren Laine office concerning patient's sleep apnea.  Her office has completed the sleep study and recommended CPAP for moderate sleep apnea in February of this year.  After long discussion today with patient and his wife, he is agreeable to wear a CPAP.  They are not sure if neurology had set up for him to have a machine or how to get one. They should place order since they have recommend settings. They may already have but pt did not follow through, he is willing now.

## 2017-11-29 NOTE — Progress Notes (Signed)
Patient ID: [name] Flesher Sr., male  DOB: [birth year], 67 y.o.   MRN: [id number] Patient Care Team    Relationship Specialty Notifications Start End  [name] Hillock, DO PCP - General Family Medicine  11/08/17   [name] Fair, MD Consulting Physician Gastroenterology  11/08/17   [name] Beam, MD Consulting Physician Neurology  11/08/17     Chief Complaint  Patient presents with  . Depression    Subjective:  [name] HOGLUND Sr. is a 67 y.o.  male present for follow up Memory loss/depression/anxiety/sleep apnea:  Patient presents today with his wife to follow-up on chronic medical conditions.  All medications were verified through pharmacy and discontinued if an appropriate after last visit.  Patient reports he has only been taking the Ativan twice a day and Prozac 40 mg daily.  He feels he is still anxious and depressed.  His wife agrees that he is still anxious and depressed.  She does state that she feels he is 75% better on the memory loss since being seen here and all medications straightened out. Prior note:  Patient reports he has been experiencing more memory loss. He gives examples of times in which he has fallen asleep during the day for over an hour while at work. He does not recall falling asleep. He endorses use of multiple benzodiazepines daily both Klonopin and Ativan use. He reports take 1 or 2 (of either medication) when feeling anxious or he can't sleep. He recently had a sleep study with confirm sleep apnea on 10/04/2017. He does not have a CPAP machine as of yet. He reports being on Prozac in the past and this is confirmed through his prior PCP notes. Prozac was never tapered to effective dose, however patient was provided with Klonopin and Ativan at high doses. His neurologist started low-dose Celexa, patient has yet to start medication. By review of his past medical history he has been prescribed Zoloft, which was felt to cause diarrhea and amitriptyline which was  discontinued for unknown reasons. He reports he saw Dr. Richrd Sox and was referred to counseling by neuro, but he does not think "talking things out" is helpful for him. Patient had a history of hypertension on diltiazem. However last PCP notes discontinue diltiazem secondary to normal blood pressures.  Ulcerative colitis/GERD: Patient is established with  Dr. Watt Climes gastroenterologist which prescribes azathioprine for his ulcerative colitis. Nexium and loperamide also prescribed through gastroenterology.  Depression screen [facility] 2/9 11/29/2017 11/08/2017  Decreased Interest - 1  Down, Depressed, Hopeless 3 2  PHQ - 2 Score 3 3  Altered sleeping 2 1  Tired, decreased energy 2 1  Change in appetite 2 1  Feeling bad or failure about yourself  1 1  Trouble concentrating 1 0  Moving slowly or fidgety/restless 0 1  Suicidal thoughts - 0  PHQ-9 Score 11 8  Difficult doing work/chores Very difficult Very difficult   GAD 7 : Generalized Anxiety Score 11/08/2017  Nervous, Anxious, on Edge 2  Control/stop worrying 1  Worry too much - different things 1  Trouble relaxing 2  Restless 2  Easily annoyed or irritable 2  Afraid - awful might happen 1  Total GAD 7 Score 11       Fall Risk  11/08/2017 11/08/2017  Falls in the past year? Yes Yes  Number falls in past yr: 2 or more 2 or more  Injury with Fall? Yes Yes  Comment few scratches few scratches  Immunization History  Administered Date(s) Administered  . Influenza-Unspecified 09/10/2017  . Pneumococcal Polysaccharide-23 06/29/2015  . Tdap 03/17/2015    No exam data present  Past Medical History:  Diagnosis Date  . Adenomatous colon polyp 2016  . Allergy   . Anxiety   . Bell palsy    resolved  . Chicken pox   . Depression   . Diet-controlled diabetes mellitus ([place])   . Eating disorder   . GERD (gastroesophageal reflux disease)   . History of vitamin D deficiency   . Hyperlipidemia   . Hypertension   . Hypokalemia   . Ulcerative  colitis ([place])    Allergies  Allergen Reactions  . Losartan Potassium-Hctz Diarrhea  . Zoloft [Sertraline Hcl] Diarrhea   Past Surgical History:  Procedure Laterality Date  . COLONOSCOPY WITH PROPOFOL N/A 07/15/2014   Procedure: COLONOSCOPY WITH PROPOFOL;  Surgeon: [name] Fair, MD;  Location: [facility];  Service: Endoscopy;  Laterality: N/A;  . COLONOSCOPY WITH PROPOFOL N/A 08/22/2015   Procedure: COLONOSCOPY WITH PROPOFOL;  Surgeon: [name] Fair, MD;  Location: [facility];  Service: Endoscopy;  Laterality: N/A;  . ORIF ANKLE FRACTURE  02/27/2012   Procedure: OPEN REDUCTION INTERNAL FIXATION (ORIF) ANKLE FRACTURE;  Surgeon: Colin Rhein, MD;  Location: [facility];  Service: Orthopedics;  Laterality: Left;  ORIF left bimalleolar ankle fracture  . SHOULDER SURGERY     LEFT  . TONSILLECTOMY    . UPPER GASTROINTESTINAL ENDOSCOPY     Family History  Problem Relation Age of Onset  . Dementia Mother   . Hypertension Mother   . Early death Father 2       drowning   . Post-traumatic stress disorder Brother   . Neuropathy Brother    Social History   Socioeconomic History  . Marital status: Married    Spouse name: [name]  . Number of children: 2  . Years of education: Not on file  . Highest education level: Some college, no degree  Occupational History  . Not on file  Social Needs  . Financial resource strain: Not on file  . Food insecurity:    Worry: Not on file    Inability: Not on file  . Transportation needs:    Medical: Not on file    Non-medical: Not on file  Tobacco Use  . Smoking status: Former Smoker    Last attempt to quit: 02/25/1982    Years since quitting: 35.7  . Smokeless tobacco: Former Systems developer    Quit date: 1990  Substance and Sexual Activity  . Alcohol use: Yes    Comment: occ  . Drug use: No  . Sexual activity: Never  Lifestyle  . Physical activity:    Days per week: Not on file    Minutes per session: Not on file  .  Stress: Not on file  Relationships  . Social connections:    Talks on phone: Not on file    Gets together: Not on file    Attends religious service: Not on file    Active member of club or organization: Not on file    Attends meetings of clubs or organizations: Not on file    Relationship status: Not on file  . Intimate partner violence:    Fear of current or ex partner: Not on file    Emotionally abused: Not on file    Physically abused: Not on file    Forced sexual activity: Not on file  Other Topics  Concern  . Not on file  Social History Narrative   Lives at home with wife [name].   College-educated. Works in Public house manager.    Allergies as of 11/29/2017      Reactions   Losartan Potassium-hctz Diarrhea   Zoloft [sertraline Hcl] Diarrhea      Medication List        Accurate as of 11/29/17 10:25 AM. Always use your most recent med list.          ANTI-DIARRHEAL PO Take 1 tablet by mouth daily as needed.   aspirin 325 MG tablet Take 325 mg by mouth as needed.   azaTHIOprine 50 MG tablet Commonly known as:  IMURAN Take 200 mg by mouth every morning.   FLUoxetine 20 MG tablet Commonly known as:  PROZAC Take 1 tablet (20 mg total) by mouth daily for 7 days, THEN 2 tablets (40 mg total) daily for 23 days. Start taking on:  11/08/2017   LORazepam 1 MG tablet Commonly known as:  ATIVAN Take 1-2 tablets (1-2 mg total) by mouth 2 (two) times daily as needed for anxiety.       All past medical history, surgical history, allergies, family history, immunizations andmedications were updated in the EMR today and reviewed under the history and medication portions of their EMR.    Recent Results (from the past 2160 hour(s))  B12 and Folate Panel     Status: None   Collection Time: 09/17/17  4:52 PM  Result Value Ref Range   Vitamin B-12 507 232 - 1,245 pg/mL   Folate 6.8 >3.0 ng/mL    Comment: A serum folate concentration of less than 3.1 ng/mL  is considered to represent clinical deficiency.   Methylmalonic acid, serum     Status: None   Collection Time: 09/17/17  4:52 PM  Result Value Ref Range   Methylmalonic Acid 64 0 - 378 nmol/L   Disclaimer: Comment     Comment: This test was developed and its performance characteristics determined by [facility]. It has not been cleared or approved by the Food and Drug Administration.   RPR     Status: None   Collection Time: 09/17/17  4:52 PM  Result Value Ref Range   RPR Ser Ql Non Reactive Non Reactive  VITAMIN D 25 Hydroxy (Vit-D Deficiency, Fractures)     Status: None   Collection Time: 11/08/17 12:20 PM  Result Value Ref Range   VITD 30.29 30.00 - 100.00 ng/mL  TSH     Status: None   Collection Time: 11/08/17 12:20 PM  Result Value Ref Range   TSH 4.32 0.35 - 4.50 uIU/mL  T4, free     Status: None   Collection Time: 11/08/17 12:20 PM  Result Value Ref Range   Free T4 0.65 0.60 - 1.60 ng/dL    Comment: Specimens from patients who are undergoing biotin therapy and /or ingesting biotin supplements may contain high levels of biotin.  The higher biotin concentration in these specimens interferes with this Free T4 assay.  Specimens that contain high levels  of biotin may cause false high results for this Free T4 assay.  Please interpret results in light of the total clinical presentation of the patient.      Nm Pet Metabolic Brain  Result Date: 10/25/2017 CLINICAL DATA:  Alzheimer's disease. EXAM: NM PET METABOLIC BRAIN TECHNIQUE: 10.1 mCi F-18 FDG was injected intravenously via the antecubital fossa. Full-ring PET imaging was performed from the vertex to the  skull base. CT data was obtained and used for attenuation correction and anatomic localization. COMPARISON:  Brain MRI 10/04/2017 FINDINGS: There is very subtle decreased by parietal cortical metabolism compared to the frontal lobe cortical metabolism. Normal relative metabolism in the occipital lobes. Mild decreased relative  cortical metabolism in the temporal lobes compared to the frontal lobes. Mild generalized atrophy on CT exam. IMPRESSION: 1. Mild (very subtle ) decreased relative cortical metabolism within the biparietal and temporal lobes compared to the frontal lobes. This could indicate early Alzheimer's type pathology but is not definitive. 2. Normal cortical metabolism within the occipital lobe. Electronically Signed   By: Suzy Bouchard M.D.   On: 10/25/2017 15:22     ROS: 14 pt review of systems performed and negative (unless mentioned in an HPI)  Objective: BP 138/82 (BP Location: Right Arm, Patient Position: Sitting, Cuff Size: Large)   Pulse 80   Temp 98.1 F (36.7 C)   Resp 20   Ht 5' 7"  (1.702 m)   Wt 250 lb (113.4 kg)   SpO2 97%   BMI 39.16 kg/m  Gen: Afebrile. No acute distress.  Nontoxic in appearance, well-developed, well-nourished, obese Caucasian male.   HENT: AT. [place].  MMM.  Eyes:Pupils Equal Round Reactive to light, Extraocular movements intact,  Conjunctiva without redness, discharge or icterus. CV: RRR no murmur, no edema, +2/4 P posterior tibialis pulses Chest: CTAB, no wheeze or crackles Abd: Soft. NTND. BS present.  No masses palpated.  Neuro:  Normal gait. PERLA. EOMi. Alert. Oriented x3 Psych: Normal affect, dress and demeanor. Normal speech. Normal thought content and judgment.  Assessment/plan: [name] Flesher Sr. is a 67 y.o. male present for  Memory loss/major depression/anxiety/insomnia -Prior polypharmacy likely contributor  to condition.  Wife is with him today that states memory issues are 75% improved since medications were reconciled/discontinued after establishment visit 3 weeks ago. - Strongly encouraged patient to follow-up for his CPAP machine through neurology--> he is now agreeable.  Will communicate with neurology to see if we can get them to follow through with getting him a machine. - Increase Prozac to 60 mg daily  - Start trazodone 50 mg nightly--> can  taper at next appointment if needed only -We will take  over Ativan prescription.  Patient is aware of controlled substance policy.  He signed a controlled substance agreement today.  Ativan 1 mg 3 times daily scheduled--> no exceptions. Pt agreeable to counseling, referred to Dr. Mamie Levers.  -Follow-up in 4 weeks, if doing well at that visit next appointment in 3 months.  Ulcerative colitis with complication, unspecified location (HCC)/Intestinal malabsorption, unspecified type - Loperamide refilled for him today.  -Continue Nexium -Follow-up with Dr. Watt Climes concerning Imuran restart.   Moderate obstructive sleep apnea Patient recently had a sleep apnea study completed and is in need of a CPAP machine.  He is now open to start wearing machine.  We will contact neurology to try to help set up.  Essential hypertension: - New. Pressure borderline on first office visit.  Elevated today on first reading and borderline on second reading.  Had been prescribed cardizem in the past, has not been on in a long time.  - low sodium. Increase exercise.  - Start Amlodipine 2.5 mg daily. - CMET  -Follow-up in 4 weeks and it will be tapered if needed.  No follow-ups on file.   Note is dictated utilizing voice recognition software. Although note has been proof read prior to signing, occasional typographical errors still  can be missed. If any questions arise, please do not hesitate to call for verification.  Electronically signed by: Howard Pouch, DO [place]

## 2017-11-29 NOTE — Patient Instructions (Addendum)
1. Ativan scheduled every 8 hours. No MORE. (anxiety)  2. Prozac increase to 60 mg a day. New script is 60 mg per pill, take ONE. (depression/anxiety) 3. Start Trazodone 50 mg at night. (depression and sleep) 4. Amlodipine start 2.5 mg a day. (blood pressure) 5. Call Dr. Watt Climes for follow up.  6. I will call to try and get CPAP set up.  7. Referred to Dr. Mamie Levers (psychology) 8. Loperamide (diarrhea) refilled.  Follow up in 1 month.    Please help [place] help you:  We are honored you have chosen [facility] for your Primary Care home. Below you will find basic instructions that you may need to access in the future. Please help [place] help you by reading the instructions, which cover many of the frequent questions we experience.   Prescription refills and request:  -In order to allow more efficient response time, please call your pharmacy for all refills. They will forward the request electronically to [place]. This allows for the quickest possible response. Request left on a nurse line can take longer to refill, since these are checked as time allows between office patients and other phone calls.  - refill request can take up to 3-5 working days to complete.  - If request is sent electronically and request is appropiate, it is usually completed in 1-2 business days.  - all patients will need to be seen routinely for all chronic medical conditions requiring prescription medications (see follow-up below). If you are overdue for follow up on your condition, you will be asked to make an appointment and we will call in enough medication to cover you until your appointment (up to 30 days).  - all controlled substances will require a face to face visit to request/refill.  - if you desire your prescriptions to go through a new pharmacy, and have an active script at original pharmacy, you will need to call your pharmacy and have scripts transferred to new pharmacy. This is completed between the pharmacy  locations and not by your provider.    Results: If any images or labs were ordered, it can take up to 1 week to get results depending on the test ordered and the lab/facility running and resulting the test. - Normal or stable results, which do not need further discussion, may be released to your mychart immediately with attached note to you. A call may not be generated for normal results. Please make certain to sign up for mychart. If you have questions on how to activate your mychart you can call the front office.  - If your results need further discussion, our office will attempt to contact you via phone, and if unable to reach you after 2 attempts, we will release your abnormal result to your mychart with instructions.  - All results will be automatically released in mychart after 1 week.  - Your provider will provide you with explanation and instruction on all relevant material in your results. Please keep in mind, results and labs may appear confusing or abnormal to the untrained eye, but it does not mean they are actually abnormal for you personally. If you have any questions about your results that are not covered, or you desire more detailed explanation than what was provided, you should make an appointment with your provider to do so.   Our office handles many outgoing and incoming calls daily. If we have not contacted you within 1 week about your results, please check your mychart to see if  there is a message first and if not, then contact our office.  In helping with this matter, you help decrease call volume, and therefore allow [place] to be able to respond to patients needs more efficiently.   Acute office visits (sick visit):  An acute visit is intended for a new problem and are scheduled in shorter time slots to allow schedule openings for patients with new problems. This is the appropriate visit to discuss a new problem. In order to provide you with excellent quality medical care with proper  time for you to explain your problem, have an exam and receive treatment with instructions, these appointments should be limited to one new problem per visit. If you experience a new problem, in which you desire to be addressed, please make an acute office visit, we save openings on the schedule to accommodate you. Please do not save your new problem for any other type of visit, let us take care of it properly and quickly for you.   Follow up visits:  Depending on your condition(s) your provider will need to see you routinely in order to provide you with quality care and prescribe medication(s). Most chronic conditions (Example: hypertension, Diabetes, depression/anxiety... etc), require visits a couple times a year. Your provider will instruct you on proper follow up for your personal medical conditions and history. Please make certain to make follow up appointments for your condition as instructed. Failing to do so could result in lapse in your medication treatment/refills. If you request a refill, and are overdue to be seen on a condition, we will always provide you with a 30 day script (once) to allow you time to schedule.    Medicare wellness (well visit): - we have a wonderful Nurse Maudie Mercury), that will meet with you and provide you will yearly medicare wellness visits. These visits should occur yearly (can not be scheduled less than 1 calendar year apart) and cover preventive health, immunizations, advance directives and screenings you are entitled to yearly through your medicare benefits. Do not miss out on your entitled benefits, this is when medicare will pay for these benefits to be ordered for you.  These are strongly encouraged by your provider and is the appropriate type of visit to make certain you are up to date with all preventive health benefits. If you have not had your medicare wellness exam in the last 12 months, please make certain to schedule one by calling the office and schedule your  medicare wellness with Maudie Mercury as soon as possible.   Yearly physical (well visit):  - Adults are recommended to be seen yearly for physicals. Check with your insurance and date of your last physical, most insurances require one calendar year between physicals. Physicals include all preventive health topics, screenings, medical exam and labs that are appropriate for gender/age and history. You may have fasting labs needed at this visit. This is a well visit (not a sick visit), new problems should not be covered during this visit (see acute visit).  - Pediatric patients are seen more frequently when they are younger. Your provider will advise you on well child visit timing that is appropriate for your their age. - This is not a medicare wellness visit. Medicare wellness exams do not have an exam portion to the visit. Some medicare companies allow for a physical, some do not allow a yearly physical. If your medicare allows a yearly physical you can schedule the medicare wellness with our nurse Maudie Mercury and have your physical  with your provider after, on the same day. Please check with insurance for your full benefits.   Late Policy/No Shows:  - all new patients should arrive 15-30 minutes earlier than appointment to allow [place] time  to  obtain all personal demographics,  insurance information and for you to complete office paperwork. - All established patients should arrive 10-15 minutes earlier than appointment time to update all information and be checked in .  - In our best efforts to run on time, if you are late for your appointment you will be asked to either reschedule or if able, we will work you back into the schedule. There will be a wait time to work you back in the schedule,  depending on availability.  - If you are unable to make it to your appointment as scheduled, please call 24 hours ahead of time to allow [place] to fill the time slot with someone else who needs to be seen. If you do not cancel your  appointment ahead of time, you may be charged a no show fee.

## 2017-12-02 ENCOUNTER — Telehealth: Payer: Self-pay | Admitting: Family Medicine

## 2017-12-02 DIAGNOSIS — E876 Hypokalemia: Secondary | ICD-10-CM

## 2017-12-02 DIAGNOSIS — R17 Unspecified jaundice: Secondary | ICD-10-CM

## 2017-12-02 MED ORDER — POTASSIUM CHLORIDE ER 10 MEQ PO TBCR: EXTENDED_RELEASE_TABLET | ORAL | 0 refills | Status: DC

## 2017-12-02 NOTE — Telephone Encounter (Signed)
Spoke with Dr Ferdinand Lango office and sleep study center they state patient has to complete the CPAP titration and when they called him on 11/11/17 he declined. Advised them he is now willing to complete this so he can start CPAP . They will call patient and try to get him scheduled.

## 2017-12-02 NOTE — Telephone Encounter (Signed)
Spoke with patient reviewed lab results and instructions. Patient verbalized understanding. 

## 2017-12-02 NOTE — Telephone Encounter (Signed)
Please inform patient the following information: 1. Potassium is low, I have called in a potassium supplement. Take as label directs --first 2 days higher dose, then 1 tab daily after.  2. His bilirubin was a little elevated, this could be for many reasons. I suggest retest at his next appt in 4 weeks. Make sure he has eaten (soemtimes this is elevated if pt fasting).  3. We communicated with Neuro and they should be calling to set up 2nd half of his sleep study so he can get his CPAP.

## 2017-12-16 ENCOUNTER — Telehealth: Payer: Self-pay

## 2017-12-16 NOTE — Telephone Encounter (Signed)
We have attempted to call the patient two times to schedule sleep study.  Patient has been unavailable at the phone numbers we have on file and has not returned our calls.  At this point we will send a letter asking patient to please contact the sleep lab to schedule their sleep study.  If patient calls back we will schedule them for their sleep study. 

## 2017-12-18 ENCOUNTER — Encounter: Payer: Self-pay | Admitting: Family Medicine

## 2017-12-18 ENCOUNTER — Ambulatory Visit (INDEPENDENT_AMBULATORY_CARE_PROVIDER_SITE_OTHER): Payer: Medicare HMO | Admitting: Family Medicine

## 2017-12-18 VITALS — BP 130/74 | HR 76 | Temp 98.2°F | Resp 20 | Ht 67.0 in | Wt 243.6 lb

## 2017-12-18 DIAGNOSIS — J0110 Acute frontal sinusitis, unspecified: Secondary | ICD-10-CM | POA: Diagnosis not present

## 2017-12-18 DIAGNOSIS — J301 Allergic rhinitis due to pollen: Secondary | ICD-10-CM

## 2017-12-18 MED ORDER — CEFDINIR 300 MG PO CAPS: 600.0000 mg | ORAL_CAPSULE | Freq: Two times a day (BID) | ORAL | 0 refills | Status: DC

## 2017-12-18 MED ORDER — FLUTICASONE PROPIONATE 50 MCG/ACT NA SUSP: 2.0000 | Freq: Every day | NASAL | 6 refills | Status: DC

## 2017-12-18 MED ORDER — PREDNISONE 50 MG PO TABS: 50.0000 mg | ORAL_TABLET | Freq: Every day | ORAL | 0 refills | Status: DC

## 2017-12-18 MED ORDER — LEVOCETIRIZINE DIHYDROCHLORIDE 5 MG PO TABS: 2.5000 mg | ORAL_TABLET | Freq: Every evening | ORAL | 3 refills | Status: DC

## 2017-12-18 NOTE — Patient Instructions (Addendum)
STOP all other nasal sprays and antihistamines (benadryl, claritin, zyrtec or allegra). NO MORE AFRIN! You are going to start Xyzal (antihistamine) at night and flonase (nasal spray) in day. You should continue this everyday to control allergies.    Rest, hydrate.  Use  mucinex (DM if cough) for congestion and cough Omnicef (antibiotic) prescribed, take until completed.  Prednisone burst for 5 days.  If cough present it can last up to 6-8 weeks.  F/U 2 weeks of not improved.    Sinusitis, Adult Sinusitis is soreness and inflammation of your sinuses. Sinuses are hollow spaces in the bones around your face. They are located:  Around your eyes.  In the middle of your forehead.  Behind your nose.  In your cheekbones.  Your sinuses and nasal passages are lined with a stringy fluid (mucus). Mucus normally drains out of your sinuses. When your nasal tissues get inflamed or swollen, the mucus can get trapped or blocked so air cannot flow through your sinuses. This lets bacteria, viruses, and funguses grow, and that leads to infection. Follow these instructions at home: Medicines  Take, use, or apply over-the-counter and prescription medicines only as told by your doctor. These may include nasal sprays.  If you were prescribed an antibiotic medicine, take it as told by your doctor. Do not stop taking the antibiotic even if you start to feel better. Hydrate and Humidify  Drink enough water to keep your pee (urine) clear or pale yellow.  Use a cool mist humidifier to keep the humidity level in your home above 50%.  Breathe in steam for 10-15 minutes, 3-4 times a day or as told by your doctor. You can do this in the bathroom while a hot shower is running.  Try not to spend time in cool or dry air. Rest  Rest as much as possible.  Sleep with your head raised (elevated).  Make sure to get enough sleep each night. General instructions  Put a warm, moist washcloth on your face 3-4  times a day or as told by your doctor. This will help with discomfort.  Wash your hands often with soap and water. If there is no soap and water, use hand sanitizer.  Do not smoke. Avoid being around people who are smoking (secondhand smoke).  Keep all follow-up visits as told by your doctor. This is important. Contact a doctor if:  You have a fever.  Your symptoms get worse.  Your symptoms do not get better within 10 days. Get help right away if:  You have a very bad headache.  You cannot stop throwing up (vomiting).  You have pain or swelling around your face or eyes.  You have trouble seeing.  You feel confused.  Your neck is stiff.  You have trouble breathing. This information is not intended to replace advice given to you by your health care provider. Make sure you discuss any questions you have with your health care provider. Document Released: 02/13/2008 Document Revised: 04/22/2016 Document Reviewed: 06/22/2015 Elsevier Interactive Patient Education  [employer].

## 2017-12-18 NOTE — Progress Notes (Signed)
[name] HARROWER Sr. , 09-01-1951, 67 y.o., male MRN: [id number] Patient Care Team    Relationship Specialty Notifications Start End  [name], DO PCP - General Family Medicine  11/08/17   [name], MD Consulting Physician Neurology  11/08/17   Clarene Essex, MD Consulting Physician Gastroenterology  11/29/17     Chief Complaint  Patient presents with  . URI    drainage,chest congestion, ongoing for 8 months     Subjective: Pt presents for an OV with complaints of nasal drainage off/on since last spring, summer and resurfaced now. During the Winter he was ok.  Associated symptoms include chest congestion, phlegm production, sinus pressure, sinus congestion. He denies current fever, chills, nausea or vomit.  Pt has tried cough syrup, Afrin and antihistamine to ease their symptoms.   Depression screen [facility] 2/9 11/29/2017 11/08/2017  Decreased Interest - 1  Down, Depressed, Hopeless 3 2  PHQ - 2 Score 3 3  Altered sleeping 2 1  Tired, decreased energy 2 1  Change in appetite 2 1  Feeling bad or failure about yourself  1 1  Trouble concentrating 1 0  Moving slowly or fidgety/restless 0 1  Suicidal thoughts - 0  PHQ-9 Score 11 8  Difficult doing work/chores Very difficult Very difficult    Allergies  Allergen Reactions  . Losartan Potassium-Hctz Diarrhea  . Zoloft [Sertraline Hcl] Diarrhea   Social History   Tobacco Use  . Smoking status: Former Smoker    Last attempt to quit: 02/25/1982    Years since quitting: 35.8  . Smokeless tobacco: Former Systems developer    Quit date: 1990  Substance Use Topics  . Alcohol use: Yes    Comment: occ   Past Medical History:  Diagnosis Date  . Adenomatous colon polyp 2016  . Allergy   . Anxiety   . Bell palsy    resolved  . Chicken pox   . Depression   . Diet-controlled diabetes mellitus ([place])   . Eating disorder   . GERD (gastroesophageal reflux disease)   . History of vitamin D deficiency   . Hyperlipidemia   . Hypertension     . Hypokalemia   . Ulcerative colitis [facility])    Past Surgical History:  Procedure Laterality Date  . COLONOSCOPY WITH PROPOFOL N/A 07/15/2014   Procedure: COLONOSCOPY WITH PROPOFOL;  Surgeon: [name], MD;  Location: [facility];  Service: Endoscopy;  Laterality: N/A;  . COLONOSCOPY WITH PROPOFOL N/A 08/22/2015   Procedure: COLONOSCOPY WITH PROPOFOL;  Surgeon: [name], MD;  Location: [facility];  Service: Endoscopy;  Laterality: N/A;  . ORIF ANKLE FRACTURE  02/27/2012   Procedure: OPEN REDUCTION INTERNAL FIXATION (ORIF) ANKLE FRACTURE;  Surgeon: Colin Rhein, MD;  Location: [facility];  Service: Orthopedics;  Laterality: Left;  ORIF left bimalleolar ankle fracture  . SHOULDER SURGERY     LEFT  . TONSILLECTOMY    . UPPER GASTROINTESTINAL ENDOSCOPY     Family History  Problem Relation Age of Onset  . Dementia Mother   . Hypertension Mother   . Early death Father 90       drowning   . Post-traumatic stress disorder Brother   . Neuropathy Brother    Allergies as of 12/18/2017      Reactions   Losartan Potassium-hctz Diarrhea   Zoloft [sertraline Hcl] Diarrhea      Medication List        Accurate as of 12/18/17  2:46 PM. Always use your most recent med list.          amLODipine 2.5 MG tablet Commonly known as:  NORVASC Take 1 tablet (2.5 mg total) by mouth daily.   aspirin 325 MG tablet Take 325 mg by mouth as needed.   azaTHIOprine 50 MG tablet Commonly known as:  IMURAN Take 200 mg by mouth every morning.   FLUoxetine HCl 60 MG Tabs Take 60 mg by mouth daily.   loperamide 2 MG tablet Commonly known as:  ANTI-DIARRHEAL Take 1 tablet (2 mg total) by mouth daily as needed.   LORazepam 1 MG tablet Commonly known as:  ATIVAN Take 1 tablet (1 mg total) by mouth 3 (three) times daily.   potassium chloride 10 MEQ tablet Commonly known as:  K-DUR Take 2 tablets (20 mEq total) by mouth 2 (two) times daily for 2 days, THEN 1 tablet (10  mEq total) daily for 28 days. Start taking on:  12/02/2017   traZODone 50 MG tablet Commonly known as:  DESYREL Take 0.5-1 tablets (25-50 mg total) by mouth at bedtime as needed for sleep.       All past medical history, surgical history, allergies, family history, immunizations andmedications were updated in the EMR today and reviewed under the history and medication portions of their EMR.     ROS: Negative, with the exception of above mentioned in HPI   Objective:  BP 130/74 (BP Location: Left Arm, Patient Position: Sitting, Cuff Size: Large)   Pulse 76   Temp 98.2 F (36.8 C)   Resp 20   Ht 5' 7"  (1.702 m)   Wt 243 lb 9.6 oz (110.5 kg)   SpO2 97%   BMI 38.15 kg/m  Body mass index is 38.15 kg/m. Gen: Afebrile. No acute distress. Nontoxic in appearance, well developed, well nourished.  HENT: AT. [place]. Bilateral TM visualized with erythema and fullness. MMM, no oral lesions. Bilateral nares with erythema and drainage. Throat without erythema or exudates. PND, cough and hoarseness present. TTP frontal sinus.  Eyes:Pupils Equal Round Reactive to light, Extraocular movements intact,  Conjunctiva without redness, discharge or icterus. Neck/lymp/endocrine: Supple,no lymphadenopathy CV: RRR  Chest: CTAB, no wheeze or crackles. Good air movement, normal resp effort.  Abd: Soft. NTND. BS present.  Skin: no rashes, purpura or petechiae. Many SK and [place] Neuro: Normal gait. PERLA. EOMi. Alert. Oriented x3 Psych: Normal affect, dress and demeanor. Normal speech. Normal thought content and judgment.  No exam data present No results found. No results found for this or any previous visit (from the past 24 hour(s)).  Assessment/Plan: [name] Flesher Sr. is a 67 y.o. male present for OV for  Acute non-recurrent frontal sinusitis Seasonal allergic rhinitis due to pollen Rest, hydrate.  Use  mucinex (DM if cough) for congestion and cough Omnicef (antibiotic) prescribed, take until completed.    Prednisone burst for 5 days.  Start 1/2 tab xyzal and flonase daily, these were prescribed. STOP all other nasal sprays and antihistamines.  If cough present it can last up to 6-8 weeks.  F/U 2 weeks of not improved.    Reviewed expectations re: course of current medical issues.  Discussed self-management of symptoms.  Outlined signs and symptoms indicating need for more acute intervention.  Patient verbalized understanding and all questions were answered.  Patient received an After-Visit Summary.    No orders of the defined types were placed in this encounter.    Note is dictated utilizing voice recognition software.  Although note has been proof read prior to signing, occasional typographical errors still can be missed. If any questions arise, please do not hesitate to call for verification.   electronically signed by:  Howard Pouch, DO  [place]

## 2017-12-26 ENCOUNTER — Other Ambulatory Visit: Payer: Self-pay | Admitting: Family Medicine

## 2017-12-31 ENCOUNTER — Telehealth: Payer: Self-pay | Admitting: Family Medicine

## 2017-12-31 NOTE — Telephone Encounter (Signed)
Copied from [place] [id number]. Topic: Quick Communication - Rx Refill/Question >> Dec 31, 2017  8:42 AM Scherrie Gerlach wrote: Medication: traZODone (DESYREL) 50 MG tablet Has the patient contacted their pharmacy? Yes Pt only got a 30 day to try.  Wife states pt is still not sleeping at night, walks the floor at night, and his depression is worse at night as well. Wants to know if dr may consider changing this med because it doesn't seem to be working.  [place], [place] - [address] [phone] (Phone) [phone] (Fax)

## 2017-12-31 NOTE — Telephone Encounter (Signed)
Spoke with patient's wife let her know patient has appointment on Friday 01/03/18 for follow up on this condition. Dr [name] will evaluate patient at that time.

## 2018-01-01 ENCOUNTER — Ambulatory Visit: Payer: Self-pay | Admitting: *Deleted

## 2018-01-01 ENCOUNTER — Emergency Department (HOSPITAL_COMMUNITY)
Admission: EM | Admit: 2018-01-01 | Discharge: 2018-01-01 | Disposition: A | Discharge status: Home / Self Care | Payer: Medicare HMO | Attending: Emergency Medicine | Admitting: Emergency Medicine

## 2018-01-01 ENCOUNTER — Encounter (HOSPITAL_COMMUNITY): Payer: Self-pay

## 2018-01-01 ENCOUNTER — Other Ambulatory Visit: Payer: Self-pay

## 2018-01-01 ENCOUNTER — Emergency Department (HOSPITAL_COMMUNITY): Payer: Medicare HMO

## 2018-01-01 DIAGNOSIS — Z87891 Personal history of nicotine dependence: Secondary | ICD-10-CM | POA: Diagnosis not present

## 2018-01-01 DIAGNOSIS — R200 Anesthesia of skin: Secondary | ICD-10-CM | POA: Diagnosis not present

## 2018-01-01 DIAGNOSIS — Z79899 Other long term (current) drug therapy: Secondary | ICD-10-CM | POA: Insufficient documentation

## 2018-01-01 DIAGNOSIS — E876 Hypokalemia: Secondary | ICD-10-CM | POA: Diagnosis not present

## 2018-01-01 DIAGNOSIS — R1013 Epigastric pain: Secondary | ICD-10-CM | POA: Insufficient documentation

## 2018-01-01 DIAGNOSIS — Z7982 Long term (current) use of aspirin: Secondary | ICD-10-CM | POA: Insufficient documentation

## 2018-01-01 DIAGNOSIS — R079 Chest pain, unspecified: Secondary | ICD-10-CM | POA: Diagnosis not present

## 2018-01-01 DIAGNOSIS — R6889 Other general symptoms and signs: Secondary | ICD-10-CM | POA: Diagnosis not present

## 2018-01-01 DIAGNOSIS — I10 Essential (primary) hypertension: Secondary | ICD-10-CM | POA: Diagnosis not present

## 2018-01-01 LAB — I-STAT TROPONIN, ED
Troponin i, poc: 0.02 ng/mL (ref 0.00–0.08)
Troponin i, poc: 0 ng/mL (ref 0.00–0.08)

## 2018-01-01 LAB — BASIC METABOLIC PANEL WITH GFR
Anion gap: 15 (ref 5–15)
BUN: 5 mg/dL — ABNORMAL LOW (ref 6–20)
CO2: 22 mmol/L (ref 22–32)
Calcium: 9.4 mg/dL (ref 8.9–10.3)
Chloride: 95 mmol/L — ABNORMAL LOW (ref 101–111)
Creatinine, Ser: 1.11 mg/dL (ref 0.61–1.24)
GFR calc Af Amer: >60 mL/min
GFR calc non Af Amer: >60 mL/min
Glucose, Bld: 156 mg/dL — ABNORMAL HIGH (ref 65–99)
Potassium: 2.8 mmol/L — ABNORMAL LOW (ref 3.5–5.1)
Sodium: 132 mmol/L — ABNORMAL LOW (ref 135–145)

## 2018-01-01 LAB — HEPATIC FUNCTION PANEL
ALT: 27 U/L (ref 17–63)
AST: 48 U/L — ABNORMAL HIGH (ref 15–41)
Albumin: 3.9 g/dL (ref 3.5–5.0)
Alkaline Phosphatase: 76 U/L (ref 38–126)
Bilirubin, Direct: 0.3 mg/dL (ref 0.1–0.5)
Indirect Bilirubin: 1.1 mg/dL — ABNORMAL HIGH (ref 0.3–0.9)
Total Bilirubin: 1.4 mg/dL — ABNORMAL HIGH (ref 0.3–1.2)
Total Protein: 6.4 g/dL — ABNORMAL LOW (ref 6.5–8.1)

## 2018-01-01 LAB — MAGNESIUM: MAGNESIUM: 1.6 mg/dL — AB (ref 1.7–2.4)

## 2018-01-01 LAB — CBC
HCT: 40.1 % (ref 39.0–52.0)
HEMOGLOBIN: 14 g/dL (ref 13.0–17.0)
MCH: 34.7 pg — ABNORMAL HIGH (ref 26.0–34.0)
MCHC: 34.9 g/dL (ref 30.0–36.0)
MCV: 99.3 fL (ref 78.0–100.0)
Platelets: 218 10*3/uL (ref 150–400)
RBC: 4.04 MIL/uL — AB (ref 4.22–5.81)
RDW: 14.4 % (ref 11.5–15.5)
WBC: 4.4 10*3/uL (ref 4.0–10.5)

## 2018-01-01 LAB — D-DIMER, QUANTITATIVE: D-Dimer, Quant: 0.29 ug{FEU}/mL (ref 0.00–0.50)

## 2018-01-01 LAB — LIPASE, BLOOD: Lipase: 28 U/L (ref 11–51)

## 2018-01-01 MED ORDER — POTASSIUM CHLORIDE CRYS ER 20 MEQ PO TBCR
40.0000 meq | EXTENDED_RELEASE_TABLET | Freq: Once | ORAL | Status: AC
  Administered 2018-01-01: 40 meq via ORAL
  Filled 2018-01-01: qty 2

## 2018-01-01 MED ORDER — MAGNESIUM SULFATE 2 GM/50ML IV SOLN
2.0000 g | Freq: Once | INTRAVENOUS | Status: AC
  Administered 2018-01-01: 2 g via INTRAVENOUS
  Filled 2018-01-01: qty 50

## 2018-01-01 MED ORDER — PANTOPRAZOLE SODIUM 20 MG PO TBEC: 20.0000 mg | DELAYED_RELEASE_TABLET | Freq: Every day | ORAL | 0 refills | Status: DC

## 2018-01-01 MED ORDER — LORAZEPAM 2 MG/ML IJ SOLN
1.0000 mg | Freq: Once | INTRAMUSCULAR | Status: AC
  Administered 2018-01-01: 1 mg via INTRAVENOUS
  Filled 2018-01-01: qty 1

## 2018-01-01 MED ORDER — POTASSIUM CHLORIDE 10 MEQ/100ML IV SOLN
10.0000 meq | Freq: Once | INTRAVENOUS | Status: AC
  Administered 2018-01-01: 10 meq via INTRAVENOUS
  Filled 2018-01-01: qty 100

## 2018-01-01 NOTE — Telephone Encounter (Signed)
Patient not on the line when agent began the transfer. TC to patient, No answer-left message to phone back to speak with a triage nurse.

## 2018-01-01 NOTE — Discharge Instructions (Addendum)
Potassium and magnesium were low today.  Continue potassium supplements and increase potassium intake in foods.   Cardiac work up today was reassuring.  I have low suspicion that this is your heart and may be from acid reflux, ulcers.  Continue nexium daily.  Avoid alcohol, spicy, greasy and acidic foods as this can cause worsening acid reflux, abdominal discomfort.   Follow up with primary care provider in 2 days as scheduled. You need Magnesium, potassium and urinalysis checked.   Return for chest pain and shortness of breath with activity or exercise, fever, cough, worsening abdominal pain, vomiting, bloody diarrhea.

## 2018-01-01 NOTE — ED Notes (Signed)
Rec sent as add on to main lab

## 2018-01-01 NOTE — ED Provider Notes (Addendum)
[facility] EMERGENCY DEPARTMENT Provider Note   CSN: [id number] Arrival date & time: 01/01/18  1758     History   Chief Complaint Chief Complaint  Patient presents with  . Chest Pain  . Shortness of Breath    HPI [name] CUEVAS Sr. is a 67 y.o. male with history of depression, anxiety, ulcerative colitis and chronic diarrhea, OSA, obesity here for evaluation of "pressure in stomach" at epigatrium, sudden onset while driving on the highway associated with nausea, feeling sweaty and hot.  Onset was sudden, gradually worsening.  Symptoms were constant, non exertional, non pleuritic, non radiating. There was no radiation into chest or CP. Currently endorsing increase belching, feeling hot and sweaty.  Wife states he has been very anxious lately due to stress at home, gradually becoming more more anxious.  He is not sleeping throughout the night, wakes up and starts walking around the house and in the yard.  She is concerned it might be his heart.  Has also had gradually worsening memory changes, has been addressing this with PCP and neurology.  Had [place] food for dinner last night.  Takes nexium.   Currently denies abdominal discomfort, chest pain, shortness of breath, nausea, vomiting but has noticed increased belching while in ED.  He received ASA, zofran and nitro en route by EMS.  Drinks 2-3 wine coolers daily.  Remote history of tobacco use in early 77s.  Denies illicit drug use.  Takes Ativan 3 times daily for anxiety.    HPI  Past Medical History:  Diagnosis Date  . Adenomatous colon polyp 2016  . Allergy   . Anxiety   . Bell palsy    resolved  . Chicken pox   . Depression   . Diet-controlled diabetes mellitus ([place])   . Eating disorder   . GERD (gastroesophageal reflux disease)   . History of vitamin D deficiency   . Hyperlipidemia   . Hypertension   . Hypokalemia   . Ulcerative colitis [facility])     Patient Active Problem List   Diagnosis Date Noted    . Benzodiazepine dependence ([place]) 11/29/2017  . Benzodiazepine contract exists 11/29/2017  . Essential hypertension 11/29/2017  . Insomnia 11/29/2017  . Memory loss 11/08/2017  . Ulcerative colitis with complication ([place]) 40/98/1191  . Moderate obstructive sleep apnea 11/08/2017  . Class 3 severe obesity due to excess calories with body mass index (BMI) of 40.0 to 44.9 in adult ([place]) 11/08/2017  . Major depression, recurrent, chronic ([place]) 09/18/2017    Past Surgical History:  Procedure Laterality Date  . COLONOSCOPY WITH PROPOFOL N/A 07/15/2014   Procedure: COLONOSCOPY WITH PROPOFOL;  Surgeon: [name], MD;  [facility];  Service: Endoscopy;  Laterality: N/A;  . COLONOSCOPY WITH PROPOFOL N/A 08/22/2015   Procedure: COLONOSCOPY WITH PROPOFOL;  Surgeon: [name], MD;  [facility];  Service: Endoscopy;  Laterality: N/A;  . ORIF ANKLE FRACTURE  02/27/2012   Procedure: OPEN REDUCTION INTERNAL FIXATION (ORIF) ANKLE FRACTURE;  Surgeon: Colin Rhein, MD;  Location: [facility];  Service: Orthopedics;  Laterality: Left;  ORIF left bimalleolar ankle fracture  . SHOULDER SURGERY     LEFT  . TONSILLECTOMY    . UPPER GASTROINTESTINAL ENDOSCOPY          Home Medications    Prior to Admission medications   Medication Sig Start Date End Date Taking? Authorizing Provider  amLODipine (NORVASC) 2.5 MG tablet Take 1 tablet (2.5 mg total)  by mouth daily. 11/29/17  Yes [name], [name], DO  azaTHIOprine (IMURAN) 50 MG tablet Take 200 mg by mouth every morning.    Yes [provider]  Cyanocobalamin (VITAMIN B 12 PO) Take 1 tablet by mouth daily.   Yes [provider]  FLUoxetine 60 MG TABS Take 60 mg by mouth daily. 11/29/17 02/27/18 Yes [name], [name], DO  fluticasone (FLONASE) 50 MCG/ACT nasal spray Place 2 sprays into both nostrils daily. Patient taking differently: Place 2 sprays into both nostrils daily as needed for allergies  or rhinitis.  12/18/17  Yes [name], [name], DO  levocetirizine (XYZAL) 5 MG tablet Take 0.5 tablets (2.5 mg total) by mouth every evening. 12/18/17  Yes [name], [name], DO  loperamide (ANTI-DIARRHEAL) 2 MG tablet Take 1 tablet (2 mg total) by mouth daily as needed. Patient taking differently: Take 2 mg by mouth daily as needed for diarrhea or loose stools.  11/29/17  Yes [name], [name], DO  LORazepam (ATIVAN) 1 MG tablet Take 1 tablet (1 mg total) by mouth 3 (three) times daily. 11/29/17  Yes [name], [name], DO  Multiple Vitamin (MULTIVITAMIN WITH MINERALS) TABS tablet Take 1 tablet by mouth daily.   Yes [provider]  potassium chloride (K-DUR) 10 MEQ tablet Take 2 tablets (20 mEq total) by mouth 2 (two) times daily for 2 days, THEN 1 tablet (10 mEq total) daily for 28 days. 12/02/17 01/01/18 Yes [name], [name], DO  traZODone (DESYREL) 50 MG tablet Take 0.5-1 tablets (25-50 mg total) by mouth at bedtime as needed for sleep. Patient taking differently: Take 50 mg by mouth at bedtime as needed for sleep.  11/29/17  Yes [name], [name], DO  aspirin 325 MG tablet Take 325 mg by mouth as needed.    [provider]  cefdinir (OMNICEF) 300 MG capsule Take 2 capsules (600 mg total) by mouth 2 (two) times daily. Patient not taking: Reported on 01/01/2018 12/18/17   Howard Pouch A, DO  pantoprazole (PROTONIX) 20 MG tablet Take 1 tablet (20 mg total) by mouth daily. 01/01/18   Kinnie Feil, PA-C  predniSONE (DELTASONE) 50 MG tablet Take 1 tablet (50 mg total) by mouth daily with breakfast. Patient not taking: Reported on 01/01/2018 12/18/17   [name], DO    Family History Family History  Problem Relation Age of Onset  . Dementia Mother   . Hypertension Mother   . Early death Father 37       drowning   . Post-traumatic stress disorder Brother   . Neuropathy Brother     Social History Social History   Tobacco Use  . Smoking status: Former Smoker    Packs/day: 1.00     Years: 19.00    Pack years: 19.00    Last attempt to quit: 02/25/1982    Years since quitting: 35.8  . Smokeless tobacco: Former Systems developer    Quit date: 1990  Substance Use Topics  . Alcohol use: Yes    Comment: occ  . Drug use: No     Allergies   Losartan potassium-hctz and Zoloft [sertraline hcl]   Review of Systems Review of Systems  Constitutional: Positive for diaphoresis.  Gastrointestinal: Positive for abdominal pain and nausea.  Psychiatric/Behavioral: Positive for dysphoric mood and sleep disturbance. The patient is nervous/anxious.   All other systems reviewed and are negative.    Physical Exam Updated Vital Signs BP (!) 152/84 (BP Location: Left Arm)   Pulse 78  Temp 98.8 F (37.1 C) (Rectal)   Resp 18   Ht 5' 7"  (1.702 m)   Wt 103 kg (227 lb)   SpO2 98%   BMI 35.55 kg/m   Physical Exam  Constitutional: He appears well-developed and well-nourished.  NAD. Non toxic.   HENT:  Head: Normocephalic and atraumatic.  Nose: Nose normal.  Moist mucous membranes. Tonsils and oropharynx normal  Eyes: Conjunctivae, EOM and lids are normal.  Neck: Trachea normal and normal range of motion.  Neck is supple Trachea midline No cervical adenopathy  Cardiovascular: Normal rate, regular rhythm, S1 normal, S2 normal and normal heart sounds.  Pulses:      Carotid pulses are 2+ on the right side, and 2+ on the left side.      Radial pulses are 2+ on the right side, and 2+ on the left side.       Dorsalis pedis pulses are 2+ on the right side, and 2+ on the left side.  RRR. No orthopnea. No LE edema or calf tenderness.   Pulmonary/Chest: Effort normal and breath sounds normal. No respiratory distress. He has no decreased breath sounds. He has no rhonchi.  No reproducible chest wall tenderness. CP not reproducible with AROM of upper extremities. No rales or wheezing.  Abdominal: Soft. Bowel sounds are normal. There is tenderness (epigastrium).  No distention. Negative  Murphy's and McBurney's. No suprapubic or CVA tenderness.   Neurological: He is alert. GCS eye subscore is 4. GCS verbal subscore is 5. GCS motor subscore is 6.  Skin: Skin is warm and dry. Capillary refill takes less than 2 seconds.  No rash to chest wall  Psychiatric: He has a normal mood and affect. His speech is normal and behavior is normal. Judgment and thought content normal. Cognition and memory are normal.     ED Treatments / Results  Labs (all labs ordered are listed, but only abnormal results are displayed) Labs Reviewed  BASIC METABOLIC PANEL - Abnormal; Notable for the following components:      Result Value   Sodium 132 (*)    Potassium 2.8 (*)    Chloride 95 (*)    Glucose, Bld 156 (*)    BUN 5 (*)    All other components within normal limits  CBC - Abnormal; Notable for the following components:   RBC 4.04 (*)    MCH 34.7 (*)    All other components within normal limits  HEPATIC FUNCTION PANEL - Abnormal; Notable for the following components:   Total Protein 6.4 (*)    AST 48 (*)    Total Bilirubin 1.4 (*)    Indirect Bilirubin 1.1 (*)    All other components within normal limits  MAGNESIUM - Abnormal; Notable for the following components:   Magnesium 1.6 (*)    All other components within normal limits  URINE CULTURE  D-DIMER, QUANTITATIVE (NOT AT [facility])  LIPASE, BLOOD  URINALYSIS, ROUTINE W REFLEX MICROSCOPIC  I-STAT TROPONIN, ED  I-STAT TROPONIN, ED    EKG EKG Interpretation  Date/Time:  Wednesday January 01 2018 19:41:03 EDT Ventricular Rate:  86 PR Interval:    QRS Duration: 130 QT Interval:  465 QTC Calculation: 557 R Axis:   -33 Text Interpretation:  Sinus rhythm Probable left atrial enlargement Left ventricular hypertrophy Prolonged QT interval Baseline wander in lead(s) V2 V3 Partial missing lead(s): V2 Confirmed by Davonna Belling [phone]) on 01/01/2018 8:56:49 PM   Radiology Dg Chest 2 View  Result Date: 01/01/2018 CLINICAL  DATA:  Chest  pain EXAM: CHEST - 2 VIEW COMPARISON:  08/09/2017 FINDINGS: Mild cardiomegaly. No confluent opacities, effusions or edema. No acute bony abnormality. IMPRESSION: Cardiomegaly.  No active disease. Electronically Signed   By: Rolm Baptise M.D.   On: 01/01/2018 19:02    Procedures Procedures (including critical care time)  Medications Ordered in ED Medications  LORazepam (ATIVAN) injection 1 mg (1 mg Intravenous Given 01/01/18 1948)  potassium chloride 10 mEq in 100 mL IVPB (0 mEq Intravenous Stopped 01/01/18 2109)  magnesium sulfate IVPB 2 g 50 mL (0 g Intravenous Stopped 01/01/18 2113)  potassium chloride SA (K-DUR,KLOR-CON) CR tablet 40 mEq (40 mEq Oral Given 01/01/18 2112)     Initial Impression / Assessment and Plan / ED Course  I have reviewed the triage vital signs and the nursing notes.  Pertinent labs & imaging results that were available during my care of the patient were reviewed by me and considered in my medical decision making (see chart for details).   67 year old here with episode of "pressure in stomach".  Associated with nausea, increased belching.  [place] food for dinner last night.  Daily ETOH use. Received nitroglycerin, aspirin, Zofran by EMS.  Symptoms nonradiating, nonexertional.  He denied CP to me.  On initial exam, patient is tachypnic but appears anxious.  Diaphoretic, complaining of feeling very hot.  Mild epigastric tenderness.  Considering GERD, PUD, gastritis, pancreatitis, cholecystitis.  Given age, risk factors we will also initiate ACS work-up given acid reflux type symptoms.  Lab work remarkable for K2.8.  Magnesium 1.6.  He has history of ulcerative colitis with chronic diarrhea, unchanged, nonbloody.  This could explain hypokalemia.  He is taking K supplements.  LFTs and lipase WNL.  EKG x2, troponin x2, chest x-ray, d-dimer negative.  He has remained asymptomatic in the ED. He is tolerating fluids.  Initially endorsed bladder pressure and darker urine, pending  urinalysis.   Final Clinical Impressions(s) / ED Diagnoses   2220: Patient does not want to wait for UA.  Pt is adamant about going home.  Explained UA needed to r/u UTI given urinary symptoms, however he declined. Risks and benefits of UA discussed. Symptoms today may be GI given ETOH intake, belching, nausea, [place] dinner last night. In regards to hypokalemia, no EKG changes.  Potassium and magnesium repleted IV/p.o, and pt deemed safe for dc with oral supplements. He has f/u with PCP in 2 days and will have K/Mg and UA checked.  Dc with K supplements, encourage decrease EtOH intake.  Discussed return precautions.  Patient verbalized understanding and agreement.  Patient discussed with Dr. Alvino Chapel. Final diagnoses:  Epigastric pain  Hypokalemia  Hypomagnesemia    ED Discharge Orders        Ordered    pantoprazole (PROTONIX) 20 MG tablet  Daily     01/01/18 2213         Arlean Hopping 01/01/18 2223    Davonna Belling, MD 01/02/18 2358

## 2018-01-01 NOTE — ED Triage Notes (Signed)
Presents with chest pain and L sided numbness ([facility] stroke screen negative) and pressure around heart; complaints also of shortness of breath and anxiety 12 lead unremarkable; given 324 of Asprin and 1 nitro, which relieved pressure, and Zofran to relieve nausea; 18 in LAC  156 CBG 130/64 HR 84; 99%RA RR 22-24 (hyperventilation with increased anxiety)

## 2018-01-01 NOTE — ED Notes (Signed)
Patient verbalized understanding of discharge instructions and denies any further needs or questions at this time. VS stable. Patient ambulatory with steady gait.  

## 2018-01-01 NOTE — ED Notes (Signed)
Patient transported to X-ray 

## 2018-01-01 NOTE — ED Notes (Signed)
Patient sitting on edge of bed, requesting to go home, stating, "I don't need to be here anymore." PA-C aware and speaking with patient at this time.

## 2018-01-01 NOTE — ED Notes (Signed)
Blood tubes in mini lab

## 2018-01-01 NOTE — ED Notes (Signed)
Rec sent to main lab for D-dimer sent earlier

## 2018-01-01 NOTE — ED Notes (Signed)
Patient aware of need for urine sample - provided with two cups ice water.

## 2018-01-01 NOTE — ED Notes (Signed)
Patient still stating he's unable to provide urine sample and is not wanting to wait any longer.

## 2018-01-03 ENCOUNTER — Encounter: Payer: Self-pay | Admitting: Family Medicine

## 2018-01-03 ENCOUNTER — Ambulatory Visit (INDEPENDENT_AMBULATORY_CARE_PROVIDER_SITE_OTHER): Payer: Medicare HMO | Admitting: Family Medicine

## 2018-01-03 VITALS — BP 138/78 | HR 72 | Temp 98.1°F | Resp 20 | Ht 67.0 in | Wt 240.0 lb

## 2018-01-03 DIAGNOSIS — E876 Hypokalemia: Secondary | ICD-10-CM

## 2018-01-03 DIAGNOSIS — G4700 Insomnia, unspecified: Secondary | ICD-10-CM

## 2018-01-03 DIAGNOSIS — I10 Essential (primary) hypertension: Secondary | ICD-10-CM | POA: Diagnosis not present

## 2018-01-03 DIAGNOSIS — F339 Major depressive disorder, recurrent, unspecified: Secondary | ICD-10-CM

## 2018-01-03 DIAGNOSIS — F1320 Sedative, hypnotic or anxiolytic dependence, uncomplicated: Secondary | ICD-10-CM | POA: Diagnosis not present

## 2018-01-03 DIAGNOSIS — K51919 Ulcerative colitis, unspecified with unspecified complications: Secondary | ICD-10-CM | POA: Diagnosis not present

## 2018-01-03 DIAGNOSIS — G4733 Obstructive sleep apnea (adult) (pediatric): Secondary | ICD-10-CM | POA: Diagnosis not present

## 2018-01-03 DIAGNOSIS — Z6841 Body Mass Index (BMI) 40.0 and over, adult: Secondary | ICD-10-CM | POA: Diagnosis not present

## 2018-01-03 DIAGNOSIS — R110 Nausea: Secondary | ICD-10-CM

## 2018-01-03 LAB — MAGNESIUM: Magnesium: 1.9 mg/dL (ref 1.5–2.5)

## 2018-01-03 LAB — POC URINALSYSI DIPSTICK (AUTOMATED)
BILIRUBIN UA: NEGATIVE
Blood, UA: NEGATIVE
GLUCOSE UA: NEGATIVE
KETONES UA: NEGATIVE
Leukocytes, UA: NEGATIVE
NITRITE UA: NEGATIVE
Protein, UA: NEGATIVE
SPEC GRAV UA: 1.01 (ref 1.010–1.025)
Urobilinogen, UA: 0.2 E.U./dL
pH, UA: 6 (ref 5.0–8.0)

## 2018-01-03 LAB — COMPREHENSIVE METABOLIC PANEL
ALBUMIN: 4.2 g/dL (ref 3.5–5.2)
ALK PHOS: 70 U/L (ref 39–117)
ALT: 22 U/L (ref 0–53)
AST: 41 U/L — ABNORMAL HIGH (ref 0–37)
BUN: 4 mg/dL — AB (ref 6–23)
CALCIUM: 9.5 mg/dL (ref 8.4–10.5)
CO2: 30 mEq/L (ref 19–32)
Chloride: 93 mEq/L — ABNORMAL LOW (ref 96–112)
Creatinine, Ser: 0.9 mg/dL (ref 0.40–1.50)
GFR: 89 mL/min (ref >60.00)
Glucose, Bld: 115 mg/dL — ABNORMAL HIGH (ref 70–99)
Potassium: 4.2 mEq/L (ref 3.5–5.1)
SODIUM: 133 meq/L — AB (ref 135–145)
TOTAL PROTEIN: 6.4 g/dL (ref 6.0–8.3)
Total Bilirubin: 1.1 mg/dL (ref 0.2–1.2)

## 2018-01-03 MED ORDER — QUETIAPINE FUMARATE 50 MG PO TABS: ORAL_TABLET | ORAL | 0 refills | Status: DC

## 2018-01-03 MED ORDER — PANTOPRAZOLE SODIUM 20 MG PO TBEC: 20.0000 mg | DELAYED_RELEASE_TABLET | Freq: Every day | ORAL | 3 refills | Status: DC

## 2018-01-03 MED ORDER — AMLODIPINE BESYLATE 5 MG PO TABS: 5.0000 mg | ORAL_TABLET | Freq: Every day | ORAL | 1 refills | Status: DC

## 2018-01-03 NOTE — Patient Instructions (Addendum)
Hypertension: Increase amlodipine to 5 mg a day (take 2 of the  2.5 mg he has home, new bottle will be 5 mg dose- take one then).   Anxiety and sleeping: Switching from trazodone to Seroquel 50 mg 1 hour before bed. If needing a higher dose  Is needed can increase to 100 mg total after 7 days.   Loperamide: is for diarrhea. Label states 2 mg  A day as needed--> when flare of diarrhea take up to 2 mg every 8 hours.   Start water/gatorade zero (sugar free) at least 80 ounces a day and more than 100 ounces when diarrhea. Buy a bottle with so many ounces that you know how many of those bottles a day you need to drink.

## 2018-01-03 NOTE — Progress Notes (Signed)
Patient ID: [name] Flesher Sr., male  DOB: Sep 12, 1950, 67 y.o.   MRN: [id number] Patient Care Team    Relationship Specialty Notifications Start End  [name], DO PCP - General Family Medicine  11/08/17   [name], MD Consulting Physician Neurology  11/08/17   Clarene Essex, MD Consulting Physician Gastroenterology  11/29/17     Chief Complaint  Patient presents with  . Follow-up    recent ER visit    Subjective:  [name] Sr. is a 67 y.o.  male present for follow up on chronic medical conditions and recent ED visit.  Major depression, recurrent, chronic (HCC)/Benzodiazepine dependence (HCC)/Insomnia, unspecified type/anxiety: Presents with his wife today much of the history is also collected from her.  She reports he is still up pacing with the use of the trazodone.  He is taking the fluoxetine 60 mg daily.  He is taking the Lorazepam 1 mg 3 times daily.  Still having increased anxiety.  Ulcerative colitis with complication, unspecified location [facility]) Established with GI.  Reports compliance with Imuran 200 mg.  Uses loperamide 2 mg daily.  Nausea/ Hypokalemia/ Hypomagnesemia/ED visit: Was seen in the emergency room on January 01, 2018 with epigastric pain.  This had come on suddenly all driving, and gradually worsened.  He became hot /sweaty, increased belching at the time.  Beta blood pressure of 152/84.  He was found to have low potassium, low magnesium and these were supplemented by IV.  Patient did admit to having [place] food the night before.  Coronary syndrome was ruled out with EKG x2, troponin x2, chest x-ray, d-dimer was negative.  Unable to collect a urine secondary to patient not being able to produce.  Patient reports he has no repeat symptoms since.  And he has been taking the oral potassium as prescribed.  He has a known history of IBS and has had more recent diarrhea flares.  He has not followed up with his gastroenterologist.  Class 3 severe obesity due to  excess calories with body mass index (BMI) of 40.0 to 44.9 in adult, unspecified whether serious comorbidity present (HCC)/Essential hypertension Pt reports compliance with being 2.5 mg daily. Blood pressures ranges at home not checked. Patient denies chest pain, shortness of breath or lower extremity edema. BMP: 01/03/2018 sodium 133, chloride 93, 0.90, AST 41, calcium 4.2 CBC: 4/24 2019 within normal limits Diet: Does not routinely watch his diet Exercise: Does not routinely exercise RF: Hypertension, obesity, hyperlipidemia, diet-controlled diabetes    Depression screen [facility] 2/9 11/29/2017 11/08/2017  Decreased Interest - 1  Down, Depressed, Hopeless 3 2  PHQ - 2 Score 3 3  Altered sleeping 2 1  Tired, decreased energy 2 1  Change in appetite 2 1  Feeling bad or failure about yourself  1 1  Trouble concentrating 1 0  Moving slowly or fidgety/restless 0 1  Suicidal thoughts - 0  PHQ-9 Score 11 8  Difficult doing work/chores Very difficult Very difficult   GAD 7 : Generalized Anxiety Score 11/08/2017  Nervous, Anxious, on Edge 2  Control/stop worrying 1  Worry too much - different things 1  Trouble relaxing 2  Restless 2  Easily annoyed or irritable 2  Afraid - awful might happen 1  Total GAD 7 Score 11       Fall Risk  11/08/2017 11/08/2017  Falls in the past year? Yes Yes  Number falls in past yr: 2 or more 2 or more  Injury with Fall? Yes Yes  Comment few scratches few scratches   Immunization History  Administered Date(s) Administered  . Influenza-Unspecified 09/10/2017  . Pneumococcal Polysaccharide-23 06/29/2015  . Tdap 03/17/2015    No exam data present  Past Medical History:  Diagnosis Date  . Adenomatous colon polyp 2016  . Allergy   . Anxiety   . Bell palsy    resolved  . Chicken pox   . Depression   . Diet-controlled diabetes mellitus ([place])   . Eating disorder   . GERD (gastroesophageal reflux disease)   . History of vitamin D deficiency   .  Hyperlipidemia   . Hypertension   . Hypokalemia   . Ulcerative colitis ([place])    Allergies  Allergen Reactions  . Losartan Potassium-Hctz Diarrhea  . Zoloft [Sertraline Hcl] Diarrhea   Past Surgical History:  Procedure Laterality Date  . COLONOSCOPY WITH PROPOFOL N/A 07/15/2014   Procedure: COLONOSCOPY WITH PROPOFOL;  Surgeon: [name], MD;  Location: [facility];  Service: Endoscopy;  Laterality: N/A;  . COLONOSCOPY WITH PROPOFOL N/A 08/22/2015   Procedure: COLONOSCOPY WITH PROPOFOL;  Surgeon: [name], MD;  Location: [facility];  Service: Endoscopy;  Laterality: N/A;  . ORIF ANKLE FRACTURE  02/27/2012   Procedure: OPEN REDUCTION INTERNAL FIXATION (ORIF) ANKLE FRACTURE;  Surgeon: Colin Rhein, MD;  Location: [place];  Service: Orthopedics;  Laterality: Left;  ORIF left bimalleolar ankle fracture  . SHOULDER SURGERY     LEFT  . TONSILLECTOMY    . UPPER GASTROINTESTINAL ENDOSCOPY     Family History  Problem Relation Age of Onset  . Dementia Mother   . Hypertension Mother   . Early death Father 69       drowning   . Post-traumatic stress disorder Brother   . Neuropathy Brother    Social History   Socioeconomic History  . Marital status: Married    Spouse name: [name]  . Number of children: 2  . Years of education: Not on file  . Highest education level: Some college, no degree  Occupational History  . Not on file  Social Needs  . Financial resource strain: Not on file  . Food insecurity:    Worry: Not on file    Inability: Not on file  . Transportation needs:    Medical: Not on file    Non-medical: Not on file  Tobacco Use  . Smoking status: Former Smoker    Packs/day: 1.00    Years: 19.00    Pack years: 19.00    Last attempt to quit: 02/25/1982    Years since quitting: 35.8  . Smokeless tobacco: Former Systems developer    Quit date: 1990  Substance and Sexual Activity  . Alcohol use: Yes    Comment: occ  . Drug use: No  . Sexual  activity: Yes    Partners: Female  Lifestyle  . Physical activity:    Days per week: Not on file    Minutes per session: Not on file  . Stress: Not on file  Relationships  . Social connections:    Talks on phone: Not on file    Gets together: Not on file    Attends religious service: Not on file    Active member of club or organization: Not on file    Attends meetings of clubs or organizations: Not on file    Relationship status: Not on file  . Intimate partner violence:    Fear of current  or ex partner: Not on file    Emotionally abused: Not on file    Physically abused: Not on file    Forced sexual activity: Not on file  Other Topics Concern  . Not on file  Social History Narrative   Lives at home with wife [name].   College-educated. Works in Public house manager.    Allergies as of 01/03/2018      Reactions   Losartan Potassium-hctz Diarrhea   Zoloft [sertraline Hcl] Diarrhea      Medication List        Accurate as of 01/03/18 11:59 PM. Always use your most recent med list.          amLODipine 5 MG tablet Commonly known as:  NORVASC Take 1 tablet (5 mg total) by mouth daily.   aspirin 325 MG tablet Take 325 mg by mouth as needed.   azaTHIOprine 50 MG tablet Commonly known as:  IMURAN Take 200 mg by mouth every morning.   FLUoxetine HCl 60 MG Tabs Take 60 mg by mouth daily.   fluticasone 50 MCG/ACT nasal spray Commonly known as:  FLONASE Place 2 sprays into both nostrils daily.   levocetirizine 5 MG tablet Commonly known as:  XYZAL Take 0.5 tablets (2.5 mg total) by mouth every evening.   loperamide 2 MG tablet Commonly known as:  ANTI-DIARRHEAL Take 1 tablet (2 mg total) by mouth daily as needed.   LORazepam 1 MG tablet Commonly known as:  ATIVAN Take 1 tablet (1 mg total) by mouth 3 (three) times daily.   multivitamin with minerals Tabs tablet Take 1 tablet by mouth daily.   pantoprazole 20 MG tablet Commonly known as:   PROTONIX Take 1 tablet (20 mg total) by mouth daily.   potassium chloride 10 MEQ tablet Commonly known as:  K-DUR Take 2 tablets (20 mEq total) by mouth 2 (two) times daily for 2 days, THEN 1 tablet (10 mEq total) daily for 28 days. Start taking on:  12/02/2017   QUEtiapine 50 MG tablet Commonly known as:  SEROQUEL 50 mg (1 tab) QHS. May increase to 2 tabs QHS if needed after 7 days.   VITAMIN B 12 PO Take 1 tablet by mouth daily.       All past medical history, surgical history, allergies, family history, immunizations andmedications were updated in the EMR today and reviewed under the history and medication portions of their EMR.    Recent Results (from the past 2160 hour(s))  VITAMIN D 25 Hydroxy (Vit-D Deficiency, Fractures)     Status: None   Collection Time: 11/08/17 12:20 PM  Result Value Ref Range   VITD 30.29 30.00 - 100.00 ng/mL  TSH     Status: None   Collection Time: 11/08/17 12:20 PM  Result Value Ref Range   TSH 4.32 0.35 - 4.50 uIU/mL  T4, free     Status: None   Collection Time: 11/08/17 12:20 PM  Result Value Ref Range   Free T4 0.65 0.60 - 1.60 ng/dL    Comment: Specimens from patients who are undergoing biotin therapy and /or ingesting biotin supplements may contain high levels of biotin.  The higher biotin concentration in these specimens interferes with this Free T4 assay.  Specimens that contain high levels  of biotin may cause false high results for this Free T4 assay.  Please interpret results in light of the total clinical presentation of the patient.    Comp Met (CMET)     Status:  Abnormal   Collection Time: 11/29/17 10:55 AM  Result Value Ref Range   Sodium 138 135 - 145 mEq/L   Potassium 3.1 (L) 3.5 - 5.1 mEq/L   Chloride 97 96 - 112 mEq/L   CO2 30 19 - 32 mEq/L   Glucose, Bld 125 (H) 70 - 99 mg/dL   BUN 5 (L) 6 - 23 mg/dL   Creatinine, Ser 1.05 0.40 - 1.50 mg/dL   Total Bilirubin 1.4 (H) 0.2 - 1.2 mg/dL   Alkaline Phosphatase 67 39 - 117 U/L    AST 29 0 - 37 U/L   ALT 23 0 - 53 U/L   Total Protein 6.9 6.0 - 8.3 g/dL   Albumin 4.4 3.5 - 5.2 g/dL   Calcium 9.6 8.4 - 10.5 mg/dL   GFR 74.51 >60.00 mL/min  Basic metabolic panel     Status: Abnormal   Collection Time: 01/01/18  6:06 PM  Result Value Ref Range   Sodium 132 (L) 135 - 145 mmol/L   Potassium 2.8 (L) 3.5 - 5.1 mmol/L   Chloride 95 (L) 101 - 111 mmol/L   CO2 22 22 - 32 mmol/L   Glucose, Bld 156 (H) 65 - 99 mg/dL   BUN 5 (L) 6 - 20 mg/dL   Creatinine, Ser 1.11 0.61 - 1.24 mg/dL   Calcium 9.4 8.9 - 10.3 mg/dL   GFR calc non Af Amer >60 >60 mL/min   GFR calc Af Amer >60 >60 mL/min    Comment: (NOTE) The eGFR has been calculated using the CKD EPI equation. This calculation has not been validated in all clinical situations. eGFR's persistently <60 mL/min signify possible Chronic Kidney Disease.    Anion gap 15 5 - 15    Comment: Performed at [facility] [address], [place], [place] [address]  CBC     Status: Abnormal   Collection Time: 01/01/18  6:06 PM  Result Value Ref Range   WBC 4.4 4.0 - 10.5 K/uL   RBC 4.04 (L) 4.22 - 5.81 MIL/uL   Hemoglobin 14.0 13.0 - 17.0 g/dL   HCT 40.1 39.0 - 52.0 %   MCV 99.3 78.0 - 100.0 fL   MCH 34.7 (H) 26.0 - 34.0 pg   MCHC 34.9 30.0 - 36.0 g/dL   RDW 14.4 11.5 - 15.5 %   Platelets 218 150 - 400 K/uL    Comment: Performed at [facility] [address] [place], [place] [address]  I-stat troponin, ED     Status: None   Collection Time: 01/01/18  6:14 PM  Result Value Ref Range   Troponin i, poc 0.02 0.00 - 0.08 ng/mL   Comment 3            Comment: Due to the release kinetics of cTnI, a negative result within the first hours of the onset of symptoms does not rule out myocardial infarction with certainty. If myocardial infarction is still suspected, repeat the test at appropriate intervals.   D-dimer, quantitative (not at [facility])     Status: None   Collection Time: 01/01/18  6:56 PM  Result Value Ref  Range   D-Dimer, Quant 0.29 0.00 - 0.50 ug/mL-FEU    Comment: (NOTE) At the manufacturer cut-off of 0.50 ug/mL FEU, this assay has been documented to exclude PE with a sensitivity and negative predictive value of 97 to 99%.  At this time, this assay has not been approved by the FDA to exclude DVT/VTE. Results should  be correlated with clinical presentation. Performed at [facility], [place] [address] [place], [place] [address]   Hepatic function panel     Status: Abnormal   Collection Time: 01/01/18  7:10 PM  Result Value Ref Range   Total Protein 6.4 (L) 6.5 - 8.1 g/dL   Albumin 3.9 3.5 - 5.0 g/dL   AST 48 (H) 15 - 41 U/L   ALT 27 17 - 63 U/L   Alkaline Phosphatase 76 38 - 126 U/L   Total Bilirubin 1.4 (H) 0.3 - 1.2 mg/dL   Bilirubin, Direct 0.3 0.1 - 0.5 mg/dL   Indirect Bilirubin 1.1 (H) 0.3 - 0.9 mg/dL    Comment: Performed at [facility] [address], [place], [place] [address]  Lipase, blood     Status: None   Collection Time: 01/01/18  7:10 PM  Result Value Ref Range   Lipase 28 11 - 51 U/L    Comment: Performed at [facility], [place] [address] [place], [place] [address]  Magnesium     Status: Abnormal   Collection Time: 01/01/18  7:44 PM  Result Value Ref Range   Magnesium 1.6 (L) 1.7 - 2.4 mg/dL    Comment: Performed at [facility] [address], [place], [place] [address]  I-Stat Troponin, ED (not at [facility])     Status: None   Collection Time: 01/01/18  9:24 PM  Result Value Ref Range   Troponin i, poc 0.00 0.00 - 0.08 ng/mL   Comment 3            Comment: Due to the release kinetics of cTnI, a negative result within the first hours of the onset of symptoms does not rule out myocardial infarction with certainty. If myocardial infarction is still suspected, repeat the test at appropriate intervals.   Pain Mgmt, Profile 8 w/Conf, U     Status: Abnormal   Collection Time: 01/03/18  9:55 AM  Result Value Ref Range   Creatinine 67.4 > or  = 20. mg/dL   pH 6.64 4.5 - 9.0   Oxidant NEGATIVE <200 mcg/mL   Amphetamines NEGATIVE <500 ng/mL   medMATCH Amphetamines CONSISTENT    Benzodiazepines POSITIVE (A) <100 ng/mL   Alphahydroxyalprazolam NEGATIVE <25 ng/mL    Comment: See Note 1   medMATCH aOH alprazolam CONSISTENT    Alphahydroxymidazolam NEGATIVE <50 ng/mL    Comment: See Note 1   medMATCH aOH midazolam CONSISTENT    Alphahydroxytriazolam NEGATIVE <50 ng/mL    Comment: See Note 1   medMATCH aOH triazolam CONSISTENT    Aminoclonazepam NEGATIVE <25 ng/mL    Comment: See Note 1   medMATCH Aminoclonazepam CONSISTENT    Hydroxyethylflurazepam NEGATIVE <50 ng/mL    Comment: See Note 1   medMATCH OH,Et flurazepam CONSISTENT    Lorazepam 1,096 (H) <50 ng/mL    Comment: See Note 1   medMATCH Lorazepam INCONSISTENT    Nordiazepam NEGATIVE <50 ng/mL    Comment: See Note 1   medMATCH Nordiazepam CONSISTENT    Oxazepam NEGATIVE <50 ng/mL    Comment: See Note 1   medMATCH Oxazepam CONSISTENT    Temazepam NEGATIVE <50 ng/mL    Comment: See Note 1   medMATCH Temazepam CONSISTENT    Marijuana Metabolite NEGATIVE <20 ng/mL   medMATCH Marijuana Metab CONSISTENT    Cocaine Metabolite NEGATIVE <150 ng/mL   medMATCH Cocaine Metab CONSISTENT    Opiates NEGATIVE <100 ng/mL   medMATCH Opiates  CONSISTENT    Oxycodone NEGATIVE <100 ng/mL   medMATCH Oxycodone CONSISTENT     Comment: Note 1 . This test was developed and its analytical performance  characteristics have been determined by [employer]. It has not been cleared or approved by the FDA. This assay has been validated pursuant to the CLIA  regulations and is used for clinical purposes.    Buprenorphine, Urine NEGATIVE <5 ng/mL   medMATCH Buprenorphine CONSISTENT    MDMA NEGATIVE <500 ng/mL   [facility] MDMA CONSISTENT    Alcohol Metabolites NEGATIVE <500 ng/mL   medMATCH Alcohol Metab CONSISTENT    6 Acetylmorphine NEGATIVE <10 ng/mL   medMATCH 6 Acetylmorphine  CONSISTENT     Comment: This drug testing is for medical treatment only.   Analysis was performed as non-forensic testing and  these results should be used only by healthcare  providers to render diagnosis or treatment, or to  monitor progress of medical conditions. Hazel Sams comments are:  - present when drug test results may be the result of     metabolism of one or more drugs or when results are     inconsistent with prescribed medication(s) listed.  - may be blank when drug results are consistent with     prescribed medication(s) listed. . For assistance with interpreting these drug results,  please contact a [employer] Toxicology  Specialist: [phone] [place] [phone]), M-F,  8am-6pm EST. This drug testing is for medical treatment only.   Analysis was performed as non-forensic testing and  these results should be used only by healthcare  providers to render diagnosis or treatment, or to  monitor progress of medical conditions. Hazel Sams comments are:  - present when  drug test results may be the result of     metabolism of one or more drugs or when results are     inconsistent with prescribed medication(s) listed.  - may be blank when drug results are consistent with     prescribed medication(s) listed. . For assistance with interpreting these drug results,  please contact a [employer] Toxicology  Specialist: [phone] [place] [phone]), M-F,  8am-6pm EST. This drug testing is for medical treatment only.   Analysis was performed as non-forensic testing and  these results should be used only by healthcare  providers to render diagnosis or treatment, or to  monitor progress of medical conditions. Hazel Sams comments are:  - present when drug test results may be the result of     metabolism of one or more drugs or when results are     inconsistent with prescribed medication(s) listed.  - may be blank when drug results are consistent with      prescribed medication(s) listed. . For assistance with interpreting these d rug results,  please contact a [employer] Toxicology  Specialist: [phone] [place] [phone]), M-F,  8am-6pm EST. This drug testing is for medical treatment only.   Analysis was performed as non-forensic testing and  these results should be used only by healthcare  providers to render diagnosis or treatment, or to  monitor progress of medical conditions. Hazel Sams comments are:  - present when drug test results may be the result of     metabolism of one or more drugs or when results are     inconsistent with prescribed medication(s) listed.  - may be blank when drug results are consistent with     prescribed medication(s) listed. . For assistance with interpreting these drug results,  please contact a [employer] Toxicology  Specialist: [phone] [place] [phone]), M-F,  8am-6pm EST. This drug testing is for medical treatment only.   Analysis was performed as non-forensic testing and  these results should be used only by healthcare  provi ders to render diagnosis or treatment, or to  monitor progress of medical conditions. Hazel Sams comments are:  - present when drug test results may be the result of     metabolism of one or more drugs or when results are     inconsistent with prescribed medication(s) listed.  - may be blank when drug results are consistent with     prescribed medication(s) listed. . For assistance with interpreting these drug results,  please contact a [employer] Toxicology  Specialist: [phone] [place] [phone]), M-F,  8am-6pm EST.   POCT Urinalysis Dipstick (Automated)     Status: None   Collection Time: 01/03/18  9:59 AM  Result Value Ref Range   Color, UA yellow    Clarity, UA clear    Glucose, UA negative    Bilirubin, UA negative    Ketones, UA negative    Spec Grav, UA 1.010 1.010 - 1.025   Blood, UA negative    pH, UA 6.0 5.0  - 8.0   Protein, UA negative    Urobilinogen, UA 0.2 0.2 or 1.0 E.U./dL   Nitrite, UA negative    Leukocytes, UA Negative Negative  Magnesium     Status: None   Collection Time: 01/03/18 10:23 AM  Result Value Ref Range   Magnesium 1.9 1.5 - 2.5 mg/dL  Comp Met (CMET)     Status: Abnormal   Collection Time: 01/03/18 10:23 AM  Result Value Ref Range   Sodium 133 (L) 135 - 145 mEq/L   Potassium 4.2 3.5 - 5.1 mEq/L   Chloride 93 (L) 96 - 112 mEq/L   CO2 30 19 - 32 mEq/L   Glucose, Bld 115 (H) 70 - 99 mg/dL   BUN 4 (L) 6 - 23 mg/dL   Creatinine, Ser 0.90 0.40 - 1.50 mg/dL   Total Bilirubin 1.1 0.2 - 1.2 mg/dL   Alkaline Phosphatase 70 39 - 117 U/L   AST 41 (H) 0 - 37 U/L   ALT 22 0 - 53 U/L   Total Protein 6.4 6.0 - 8.3 g/dL   Albumin 4.2 3.5 - 5.2 g/dL   Calcium 9.5 8.4 - 10.5 mg/dL   GFR 89.00 >60.00 mL/min    Nm Pet Metabolic Brain  Result Date: 10/25/2017 CLINICAL DATA:  Alzheimer's disease. EXAM: NM PET METABOLIC BRAIN TECHNIQUE: 10.1 mCi F-18 FDG was injected intravenously via the antecubital fossa. Full-ring PET imaging was performed from the vertex to the skull base. CT data was obtained and used for attenuation correction and anatomic localization. COMPARISON:  Brain MRI 10/04/2017 FINDINGS: There is very subtle decreased by parietal cortical metabolism compared to the frontal lobe cortical metabolism. Normal relative metabolism in the occipital lobes. Mild decreased relative cortical metabolism in the temporal lobes compared to the frontal lobes. Mild generalized atrophy on CT exam. IMPRESSION: 1. Mild (very subtle ) decreased relative cortical metabolism within the biparietal and temporal lobes compared to the frontal lobes. This could indicate early Alzheimer's type pathology but is not definitive. 2. Normal cortical metabolism within the occipital lobe. Electronically Signed   By: Suzy Bouchard M.D.   On: 10/25/2017 15:22     ROS: 14 pt review of systems performed and  negative (unless mentioned in an  HPI)  Objective: BP 138/78 (BP Location: Right Arm, Cuff Size: Large)   Pulse 72   Temp 98.1 F (36.7 C)   Resp 20   Ht 5' 7"  (1.702 m)   Wt 240 lb (108.9 kg)   SpO2 96%   BMI 37.59 kg/m  Gen: Afebrile. No acute distress.  HENT: AT. [place].MMM.  Eyes:Pupils Equal Round Reactive to light, Extraocular movements intact,  Conjunctiva without redness, discharge or icterus. Neck/lymp/endocrine: Supple,no lymphadenopathy, no thyromegaly CV: RRR no murmur, no edema Chest: CTAB, no wheeze or crackles Abd: Soft.  NTND. BS + Neuro: Normal gait. PERLA. EOMi. Alert. Oriented x3  Psych: Normal affect, dress and demeanor. Normal speech. Normal thought content and judgment.  No results found for this or any previous visit (from the past 48 hour(s)).  Assessment/plan: [name] Flesher Sr. is a 67 y.o. male present for  major depression/anxiety/insomnia/OSA - improving, however still have much anxiety and difficulty sleeping.  - He now has f/u for his CPAP machine through neurology - Continue Prozac to 60 mg daily  - Stop trazodone. Did not work well for him. Start seroquel 50 mg QHS--> may taper to 100 mg QHS in a week if needed only.  - continue ativan TID.  Patient is aware of controlled substance policy.  He signed a controlled substance agreement.  Ativan 1 mg 3 times daily scheduled--> no exceptions. - No etoh.  Pt agreeable to counseling, referred to Dr. Mamie Levers last appt   -Follow-up in 4 weeks. Will taper up again on Seroquel if needed and/or refer to psychiatry.   Ulcerative colitis with complication, unspecified location (HCC)/Intestinal malabsorption, unspecified type/low magnesium/hypokalemia - Loperamide refilled for him today--> advised him he could take up to TID with flares.  - Continue Protonix -CMP and mag today. -Follow-up with Dr. Watt Climes concerning Imuran.   Moderate obstructive sleep apnea He now has an appointment set up to review sleep  apnea results and obtained CPAP  Essential hypertension: -Increase amlodipine to 5 mg daily. -Low-sodium diet.  Increase exercise. -BMP collected today. - F/U 4 weeks.   Return in about 1 month (around 01/31/2018) for HTN/depression.  Greater than 40 minutes spent with patient, >50% of time spent face to face counseling, covering acute and chronic medical conditions, as well as hospital follow up.   Note is dictated utilizing voice recognition software. Although note has been proof read prior to signing, occasional typographical errors still can be missed. If any questions arise, please do not hesitate to call for verification.  Electronically signed by: Howard Pouch, DO [place]

## 2018-01-06 ENCOUNTER — Other Ambulatory Visit: Payer: Self-pay | Admitting: Family Medicine

## 2018-01-06 ENCOUNTER — Telehealth: Payer: Self-pay | Admitting: Family Medicine

## 2018-01-06 LAB — PAIN MGMT, PROFILE 8 W/CONF, U
6 Acetylmorphine: NEGATIVE ng/mL (ref <10)
ALPHAHYDROXYMIDAZOLAM: NEGATIVE ng/mL (ref <50)
ALPHAHYDROXYTRIAZOLAM: NEGATIVE ng/mL (ref <50)
AMINOCLONAZEPAM: NEGATIVE ng/mL (ref <25)
Alcohol Metabolites: NEGATIVE ng/mL (ref <500)
Alphahydroxyalprazolam: NEGATIVE ng/mL (ref <25)
Amphetamines: NEGATIVE ng/mL (ref <500)
BENZODIAZEPINES: POSITIVE ng/mL — AB (ref <100)
BUPRENORPHINE, URINE: NEGATIVE ng/mL (ref <5)
Cocaine Metabolite: NEGATIVE ng/mL (ref <150)
Creatinine: 67.4 mg/dL
HYDROXYETHYLFLURAZEPAM: NEGATIVE ng/mL (ref <50)
Lorazepam: 1096 ng/mL — ABNORMAL HIGH (ref <50)
MDMA: NEGATIVE ng/mL (ref <500)
Marijuana Metabolite: NEGATIVE ng/mL (ref <20)
Nordiazepam: NEGATIVE ng/mL (ref <50)
OXAZEPAM: NEGATIVE ng/mL (ref <50)
OXYCODONE: NEGATIVE ng/mL (ref <100)
Opiates: NEGATIVE ng/mL (ref <100)
Oxidant: NEGATIVE ug/mL (ref <200)
TEMAZEPAM: NEGATIVE ng/mL (ref <50)
pH: 6.64 (ref 4.5–9.0)

## 2018-01-06 MED ORDER — POTASSIUM CHLORIDE ER 10 MEQ PO TBCR: 10.0000 meq | EXTENDED_RELEASE_TABLET | Freq: Every day | ORAL | 3 refills | Status: DC

## 2018-01-06 NOTE — Telephone Encounter (Signed)
Please inform patient the following information: His potassium is good. I have refilled his script for him to continue a daily 10 meq supplement to keep levels in normal range.

## 2018-01-06 NOTE — Telephone Encounter (Signed)
Spoke with patient reviewed lab results and instructions. Patient verbalized understanding. 

## 2018-01-07 ENCOUNTER — Encounter: Payer: Self-pay | Admitting: Family Medicine

## 2018-01-07 DIAGNOSIS — G4733 Obstructive sleep apnea (adult) (pediatric): Secondary | ICD-10-CM | POA: Insufficient documentation

## 2018-01-10 ENCOUNTER — Ambulatory Visit: Payer: Medicare HMO | Admitting: Neurology

## 2018-01-13 ENCOUNTER — Ambulatory Visit (INDEPENDENT_AMBULATORY_CARE_PROVIDER_SITE_OTHER): Payer: Medicare HMO | Admitting: Neurology

## 2018-01-13 DIAGNOSIS — G4720 Circadian rhythm sleep disorder, unspecified type: Secondary | ICD-10-CM

## 2018-01-13 DIAGNOSIS — G4733 Obstructive sleep apnea (adult) (pediatric): Secondary | ICD-10-CM | POA: Diagnosis not present

## 2018-01-13 DIAGNOSIS — Z6841 Body Mass Index (BMI) 40.0 and over, adult: Secondary | ICD-10-CM

## 2018-01-14 ENCOUNTER — Telehealth: Payer: Self-pay

## 2018-01-14 NOTE — Telephone Encounter (Signed)
I called pt to discuss his sleep study results. No answer, left a message asking him to call me back. 

## 2018-01-14 NOTE — Progress Notes (Signed)
Patient referred by Dr. Jaynee Eagles, seen by me on 09/18/17, diagnostic PSG on 09/30/17. Patient had a CPAP titration study on 01/13/18.  Please call and inform patient that I have entered an order for treatment with positive airway pressure (PAP) treatment for obstructive sleep apnea (OSA). He did well during the latest sleep study with CPAP. We will, therefore, arrange for a machine for home use through a DME (durable medical equipment) company of His choice; and I will see the patient back in follow-up in about 10 weeks. Please also explain to the patient that I will be looking out for compliance data, which can be downloaded from the machine (stored on an SD card, that is inserted in the machine) or via remote access through a modem, that is built into the machine. At the time of the followup appointment we will discuss sleep study results and how it is going with PAP treatment at home. Please advise patient to bring His machine at the time of the first FU visit, even though this is cumbersome. Bringing the machine for every visit after that will likely not be needed, but often helps for the first visit to troubleshoot if needed. Please re-enforce the importance of compliance with treatment and the need for [place] to monitor compliance data - often an insurance requirement and actually good feedback for the patient as far as how they are doing.  Also remind patient, that any interim PAP machine or mask issues should be first addressed with the DME company, as they can often help better with technical and mask fit issues. Please ask if patient has a preference regarding DME company.  Please also make sure, the patient has a follow-up appointment with me in about 10 weeks from the setup date, thanks. May see one of our nurse practitioners if needed for proper timing of the FU appointment.  Please fax or rout report to the referring provider. Thanks,   Star Age, MD, PhD [facility])

## 2018-01-14 NOTE — Addendum Note (Signed)
Addended by: Star Age on: 01/14/2018 08:31 AM   Modules accepted: Orders

## 2018-01-14 NOTE — Telephone Encounter (Signed)
-----   Message from Star Age, MD sent at 01/14/2018  8:31 AM EDT ----- Patient referred by Dr. Jaynee Eagles, seen by me on 09/18/17, diagnostic PSG on 09/30/17. Patient had a CPAP titration study on 01/13/18.  Please call and inform patient that I have entered an order for treatment with positive airway pressure (PAP) treatment for obstructive sleep apnea (OSA). He did well during the latest sleep study with CPAP. We will, therefore, arrange for a machine for home use through a DME (durable medical equipment) company of His choice; and I will see the patient back in follow-up in about 10 weeks. Please also explain to the patient that I will be looking out for compliance data, which can be downloaded from the machine (stored on an SD card, that is inserted in the machine) or via remote access through a modem, that is built into the machine. At the time of the followup appointment we will discuss sleep study results and how it is going with PAP treatment at home. Please advise patient to bring His machine at the time of the first FU visit, even though this is cumbersome. Bringing the machine for every visit after that will likely not be needed, but often helps for the first visit to troubleshoot if needed. Please re-enforce the importance of compliance with treatment and the need for [place] to monitor compliance data - often an insurance requirement and actually good feedback for the patient as far as how they are doing.  Also remind patient, that any interim PAP machine or mask issues should be first addressed with the DME company, as they can often help better with technical and mask fit issues. Please ask if patient has a preference regarding DME company.  Please also make sure, the patient has a follow-up appointment with me in about 10 weeks from the setup date, thanks. May see one of our nurse practitioners if needed for proper timing of the FU appointment.  Please fax or rout report to the referring provider. Thanks,    Star Age, MD, PhD [facility])

## 2018-01-14 NOTE — Procedures (Signed)
PATIENT'S NAME:  [name], [name] DOB:      05/29/1951      MR#:    [id number]     DATE OF RECORDING: 01/13/2018 REFERRING M.D.: [name] Ill, MD                        PCP: [name] Huddle, MD Study Performed:   CPAP  Titration HISTORY:  67 year old man with a history of anxiety, depression, ulcerative colitis, memory loss, low back pain, hypertension, Bell's palsy, diabetes, and morbid obesity, who returns for a CPAP titration. He had a baseline PSG on 09/30/17, which showed moderate obstructive sleep apnea, with a total AHI of 20.3/hour and O2 nadir of 77%. Of note, the absence of REM sleep likely underestimated his AHI and O2 nadir. His Epworth sleepiness score is 18 out of 24. The patient's weight 261 pounds with a height of 67 (inches), resulting in a BMI of 40.8 kg/m2. The patient's neck circumference measured 20.4 inches.    CURRENT MEDICATIONS: Aspirin, Azathioprine, Chlorpheniramine, Clonazepam, Esomeprazole Magnesium, Guaifenesin, Loperamide, Lorazepam, Nyquil and Tramadol.  PROCEDURE:  This is a multichannel digital polysomnogram utilizing the SomnoStar 11.2 system.  Electrodes and sensors were applied and monitored per AASM Specifications.   EEG, EOG, Chin and Limb EMG, were sampled at 200 Hz.  ECG, Snore and Nasal Pressure, Thermal Airflow, Respiratory Effort, CPAP Flow and Pressure, Oximetry was sampled at 50 Hz. Digital video and audio were recorded.      The patient was fitted with medium nasal pillows, CPAP was initiated at 5 cmH20 with heated humidity per AASM standards and pressure was advanced to 9 cmH20 because of hypopneas, apneas and desaturations.  At a PAP pressure of 9 cmH20, there was a reduction of the AHI to 0/hour with very brief supine REM sleep achieved and O2 nadir of 89%.  Lights Out was at 22:15 and Lights On at 05:00. Total recording time (TRT) was 405 minutes, with a total sleep time (TST) of 317 minutes. The patient's sleep latency was 61 minutes, which is delayed. REM  latency was 111 minutes. The sleep efficiency was 78.3 %.    SLEEP ARCHITECTURE: WASO (Wake after sleep onset) was 43.5 minutes with mild to moderate sleep fragmentation noted. There were 24.5 minutes in Stage N1, 239 minutes Stage N2, 0 minutes Stage N3 and 53.5 minutes in Stage REM.  The percentage of Stage N1 was 7.7%, Stage N2 was 75.4%, which is increased, Stage N3 was absent, and Stage R (REM sleep) was 16.9%, which is mildly reduced. The arousals were noted as: 44 were spontaneous, 0 were associated with PLMs, 7 were associated with respiratory events.  RESPIRATORY ANALYSIS:  There was a total of 7 respiratory events: 0 obstructive apneas, 0 central apneas and 0 mixed apneas with a total of 0 apneas and an apnea index (AI) of 0 /hour. There were 7 hypopneas with a hypopnea index of 1.3/hour. The patient also had 0 respiratory event related arousals (RERAs).      The total APNEA/HYPOPNEA INDEX  (AHI) was 1.3/hour and the total RESPIRATORY DISTURBANCE INDEX was 1.3 0./hour  4 events occurred in REM sleep and 3 events in NREM. The REM AHI was 4.5/hour versus a non-REM AHI of .7 0./hour.  The patient spent 124 minutes of total sleep time in the supine position and 193 minutes in non-supine. The supine AHI was 3.4, versus a non-supine AHI of 0.0.  OXYGEN SATURATION & C02:  The baseline 02  saturation was 94%, with the lowest being 82%. Time spent below 89% saturation equaled 4 minutes.  PERIODIC LIMB MOVEMENTS:  The patient had a total of 0 Periodic Limb Movements. The Periodic Limb Movement (PLM) index was 0 and the PLM Arousal index was 0 /hour.  Audio and video analysis did not show any abnormal or unusual movements, behaviors, phonations or vocalizations. The patient took 1 bathroom break. The EKG was in keeping with normal sinus rhythm (NSR).  Post-study, the patient indicated that sleep was worse than usual.   IMPRESSION:   1. Obstructive Sleep Apnea (OSA) 2. Dysfunctions associated with  sleep stages or arousal from sleep   RECOMMENDATIONS:   1. This study demonstrates resolution of the patient's obstructive sleep apnea with CPAP therapy. I will, therefore, start the patient on home CPAP treatment at a pressure of 10 cm (due to very little supine REM sleep achieved on the final titration pressure of 9 cm and O2 nadir of 89%) via medium nasal pillows with heated humidity. The patient should be reminded to be fully compliant with PAP therapy to improve sleep related symptoms and decrease long term cardiovascular risks. The patient should be reminded, that it may take up to 3 months to get fully used to using PAP with all planned sleep. The earlier full compliance is achieved, the better long term compliance tends to be. Please note that untreated obstructive sleep apnea may carry additional perioperative morbidity. Patients with significant obstructive sleep apnea should receive perioperative PAP therapy and the surgeons and particularly the anesthesiologist should be informed of the diagnosis and the severity of the sleep disordered breathing. 2. This study shows sleep fragmentation and abnormal sleep stage percentages; these are nonspecific findings and per se do not signify an intrinsic sleep disorder or a cause for the patient's sleep-related symptoms. Causes include (but are not limited to) the first night effect of the sleep study, circadian rhythm disturbances, medication effect or an underlying mood disorder or medical problem.  3. The patient should be cautioned not to drive, work at heights, or operate dangerous or heavy equipment when tired or sleepy. Review and reiteration of good sleep hygiene measures should be pursued with any patient. 4. The patient will be seen in follow-up by Dr. Rexene [name] at [facility] for discussion of the test results and further management strategies. The referring provider will be notified of the test results.   I certify that I have reviewed the entire raw data  recording prior to the issuance of this report in accordance with the Standards of Accreditation of the American Academy of Sleep Medicine (AASM)     Star Age, MD, PhD Diplomat, American Board of Psychiatry and Neurology (Neurology and Sleep Medicine)

## 2018-01-15 ENCOUNTER — Ambulatory Visit: Payer: Medicare HMO | Admitting: Neurology

## 2018-01-15 NOTE — Telephone Encounter (Signed)
I called pt. I advised pt that Dr. Rexene Alberts reviewed their sleep study results and found that pt did well with the cpap during his latest sleep study. Dr. Rexene Alberts recommends that pt start a cpap at home. I reviewed PAP compliance expectations with the pt. Pt is agreeable to starting a CPAP. I advised pt that an order will be sent to a DME, [facility], and [facility] will call the pt within about one week after they file with the pt's insurance. [facility] will show the pt how to use the machine, fit for masks, and troubleshoot the CPAP if needed. A follow up appt was made for insurance purposes with Dr. Rexene Alberts on 04/24/18 at 2:00pm. Pt verbalized understanding to arrive 15 minutes early and bring their CPAP. A letter with all of this information in it will be sent to the pt's mychart as a reminder. I verified with the pt that the address we have on file is correct. Pt verbalized understanding of results. Pt had no questions at this time but was encouraged to call back if questions arise.

## 2018-01-31 ENCOUNTER — Telehealth: Payer: Self-pay | Admitting: Family Medicine

## 2018-01-31 ENCOUNTER — Encounter: Payer: Self-pay | Admitting: Family Medicine

## 2018-01-31 ENCOUNTER — Ambulatory Visit (INDEPENDENT_AMBULATORY_CARE_PROVIDER_SITE_OTHER): Payer: Medicare HMO | Admitting: Family Medicine

## 2018-01-31 VITALS — BP 138/79 | HR 70 | Temp 98.1°F | Resp 20 | Ht 67.0 in | Wt 237.0 lb

## 2018-01-31 DIAGNOSIS — Z6841 Body Mass Index (BMI) 40.0 and over, adult: Secondary | ICD-10-CM

## 2018-01-31 DIAGNOSIS — Z79899 Other long term (current) drug therapy: Secondary | ICD-10-CM

## 2018-01-31 DIAGNOSIS — R413 Other amnesia: Secondary | ICD-10-CM | POA: Diagnosis not present

## 2018-01-31 DIAGNOSIS — F1320 Sedative, hypnotic or anxiolytic dependence, uncomplicated: Secondary | ICD-10-CM | POA: Diagnosis not present

## 2018-01-31 DIAGNOSIS — G4700 Insomnia, unspecified: Secondary | ICD-10-CM | POA: Diagnosis not present

## 2018-01-31 DIAGNOSIS — K51919 Ulcerative colitis, unspecified with unspecified complications: Secondary | ICD-10-CM

## 2018-01-31 DIAGNOSIS — Z0289 Encounter for other administrative examinations: Secondary | ICD-10-CM

## 2018-01-31 DIAGNOSIS — I10 Essential (primary) hypertension: Secondary | ICD-10-CM

## 2018-01-31 DIAGNOSIS — F339 Major depressive disorder, recurrent, unspecified: Secondary | ICD-10-CM | POA: Diagnosis not present

## 2018-01-31 DIAGNOSIS — G4733 Obstructive sleep apnea (adult) (pediatric): Secondary | ICD-10-CM

## 2018-01-31 MED ORDER — AMLODIPINE BESYLATE 10 MG PO TABS: 10.0000 mg | ORAL_TABLET | Freq: Every day | ORAL | 0 refills | Status: DC

## 2018-01-31 MED ORDER — QUETIAPINE FUMARATE 50 MG PO TABS: ORAL_TABLET | ORAL | 1 refills | Status: DC

## 2018-01-31 NOTE — Progress Notes (Signed)
Patient ID: [name] Flesher Sr., male  DOB: Feb 03, 1951, 67 y.o.   MRN: [id number] Patient Care Team    Relationship Specialty Notifications Start End  [name], DO PCP - General Family Medicine  11/08/17   [name], MD Consulting Physician Neurology  11/08/17   Clarene Essex, MD Consulting Physician Gastroenterology  11/29/17     Chief Complaint  Patient presents with  . Depression    follow up    Subjective:  [name] DIBELLA Sr. is a 67 y.o.  male present for follow up on chronic medical conditions and recent ED visit.  Major depression, recurrent, chronic (HCC)/Benzodiazepine dependence (HCC)/Insomnia, unspecified type/anxiety: Presents alone today and unsure of medication doses, since his wife takes care of all his meds.  He reports he does feel like he is sleeping better since last visit.  Prescribed: fluoxetine 60 mg daily.  Lorazepam 1 mg 3 times daily.  Seroquel 50-100 QHS.  Tried trazodone, which was not effective.    Class 3 severe obesity due to excess calories with body mass index (BMI) of 40.0 to 44.9 in adult, unspecified whether serious comorbidity present (HCC)/Essential hypertension Patient denies chest pain, shortness of breath, dizziness or lower extremity edema. He is uncertain the med dose. States he did take the meds today (his wife sits out for him) BMP: 01/03/2018 sodium 133, chloride 93, 0.90, AST 41, calcium 4.2 CBC: 4/24 2019 within normal limits Diet: Does not routinely watch his diet Exercise: Does not routinely exercise RF: Hypertension, obesity, hyperlipidemia, diet-controlled diabetes BMP Latest Ref Rng & Units 01/03/2018 01/01/2018 11/29/2017  Glucose 70 - 99 mg/dL 115(H) 156(H) 125(H)  BUN 6 - 23 mg/dL 4(L) 5(L) 5(L)  Creatinine 0.40 - 1.50 mg/dL 0.90 1.11 1.05  Sodium 135 - 145 mEq/L 133(L) 132(L) 138  Potassium 3.5 - 5.1 mEq/L 4.2 2.8(L) 3.1(L)  Chloride 96 - 112 mEq/L 93(L) 95(L) 97  CO2 19 - 32 mEq/L 30 22 30   Calcium 8.4 - 10.5 mg/dL  9.5 9.4 9.6   CBC Latest Ref Rng & Units 01/01/2018 08/09/2017 02/27/2012  WBC 4.0 - 10.5 K/uL 4.4 4.9 -  Hemoglobin 13.0 - 17.0 g/dL 14.0 14.1 13.3  Hematocrit 39.0 - 52.0 % 40.1 41 -  Platelets 150 - 400 K/uL 218 197 -    Depression screen [facility] 2/9 11/29/2017 11/08/2017  Decreased Interest - 1  Down, Depressed, Hopeless 3 2  PHQ - 2 Score 3 3  Altered sleeping 2 1  Tired, decreased energy 2 1  Change in appetite 2 1  Feeling bad or failure about yourself  1 1  Trouble concentrating 1 0  Moving slowly or fidgety/restless 0 1  Suicidal thoughts - 0  PHQ-9 Score 11 8  Difficult doing work/chores Very difficult Very difficult   GAD 7 : Generalized Anxiety Score 11/08/2017  Nervous, Anxious, on Edge 2  Control/stop worrying 1  Worry too much - different things 1  Trouble relaxing 2  Restless 2  Easily annoyed or irritable 2  Afraid - awful might happen 1  Total GAD 7 Score 11       Fall Risk  11/08/2017 11/08/2017  Falls in the past year? Yes Yes  Number falls in past yr: 2 or more 2 or more  Injury with Fall? Yes Yes  Comment few scratches few scratches   Immunization History  Administered Date(s) Administered  . Influenza-Unspecified 09/10/2017  . Pneumococcal Polysaccharide-23 06/29/2015  . Tdap 03/17/2015  No exam data present  Past Medical History:  Diagnosis Date  . Adenomatous colon polyp 2016  . Allergy   . Anxiety   . Bell palsy    resolved  . Chicken pox   . Depression   . Diet-controlled diabetes mellitus ([place])   . Eating disorder   . GERD (gastroesophageal reflux disease)   . History of vitamin D deficiency   . Hyperlipidemia   . Hypertension   . Hypokalemia   . Ulcerative colitis ([place])    Allergies  Allergen Reactions  . Losartan Potassium-Hctz Diarrhea  . Zoloft [Sertraline Hcl] Diarrhea   Past Surgical History:  Procedure Laterality Date  . COLONOSCOPY WITH PROPOFOL N/A 07/15/2014   Procedure: COLONOSCOPY WITH PROPOFOL;  Surgeon: [name], MD;  Location: [facility];  Service: Endoscopy;  Laterality: N/A;  . COLONOSCOPY WITH PROPOFOL N/A 08/22/2015   Procedure: COLONOSCOPY WITH PROPOFOL;  Surgeon: [name], MD;  Location: [facility];  Service: Endoscopy;  Laterality: N/A;  . ORIF ANKLE FRACTURE  02/27/2012   Procedure: OPEN REDUCTION INTERNAL FIXATION (ORIF) ANKLE FRACTURE;  Surgeon: Colin Rhein, MD;  Location: [facility];  Service: Orthopedics;  Laterality: Left;  ORIF left bimalleolar ankle fracture  . SHOULDER SURGERY     LEFT  . TONSILLECTOMY    . UPPER GASTROINTESTINAL ENDOSCOPY     Family History  Problem Relation Age of Onset  . Dementia Mother   . Hypertension Mother   . Early death Father 61       drowning   . Post-traumatic stress disorder Brother   . Neuropathy Brother    Social History   Socioeconomic History  . Marital status: Married    Spouse name: [name]  . Number of children: 2  . Years of education: Not on file  . Highest education level: Some college, no degree  Occupational History  . Not on file  Social Needs  . Financial resource strain: Not on file  . Food insecurity:    Worry: Not on file    Inability: Not on file  . Transportation needs:    Medical: Not on file    Non-medical: Not on file  Tobacco Use  . Smoking status: Former Smoker    Packs/day: 1.00    Years: 19.00    Pack years: 19.00    Last attempt to quit: 02/25/1982    Years since quitting: 35.9  . Smokeless tobacco: Former Systems developer    Quit date: 1990  Substance and Sexual Activity  . Alcohol use: Yes    Comment: occ  . Drug use: No  . Sexual activity: Yes    Partners: Female  Lifestyle  . Physical activity:    Days per week: Not on file    Minutes per session: Not on file  . Stress: Not on file  Relationships  . Social connections:    Talks on phone: Not on file    Gets together: Not on file    Attends religious service: Not on file    Active member of club or organization:  Not on file    Attends meetings of clubs or organizations: Not on file    Relationship status: Not on file  . Intimate partner violence:    Fear of current or ex partner: Not on file    Emotionally abused: Not on file    Physically abused: Not on file    Forced sexual activity: Not on file  Other Topics Concern  .  Not on file  Social History Narrative   Lives at home with wife [name].   College-educated. Works in Public house manager.    Allergies as of 01/31/2018      Reactions   Losartan Potassium-hctz Diarrhea   Zoloft [sertraline Hcl] Diarrhea      Medication List        Accurate as of 01/31/18  9:08 AM. Always use your most recent med list.          amLODipine 5 MG tablet Commonly known as:  NORVASC Take 1 tablet (5 mg total) by mouth daily.   aspirin 325 MG tablet Take 325 mg by mouth as needed.   azaTHIOprine 50 MG tablet Commonly known as:  IMURAN Take 200 mg by mouth every morning.   FLUoxetine HCl 60 MG Tabs Take 60 mg by mouth daily.   fluticasone 50 MCG/ACT nasal spray Commonly known as:  FLONASE Place 2 sprays into both nostrils daily.   levocetirizine 5 MG tablet Commonly known as:  XYZAL Take 0.5 tablets (2.5 mg total) by mouth every evening.   loperamide 2 MG tablet Commonly known as:  ANTI-DIARRHEAL Take 1 tablet (2 mg total) by mouth daily as needed.   LORazepam 1 MG tablet Commonly known as:  ATIVAN Take 1 tablet (1 mg total) by mouth 3 (three) times daily.   multivitamin with minerals Tabs tablet Take 1 tablet by mouth daily.   pantoprazole 20 MG tablet Commonly known as:  PROTONIX Take 1 tablet (20 mg total) by mouth daily.   potassium chloride 10 MEQ tablet Commonly known as:  K-DUR Take 1 tablet (10 mEq total) by mouth daily.   QUEtiapine 50 MG tablet Commonly known as:  SEROQUEL 50 mg (1 tab) QHS. May increase to 2 tabs QHS if needed after 7 days.   VITAMIN B 12 PO Take 1 tablet by mouth daily.       All  past medical history, surgical history, allergies, family history, immunizations andmedications were updated in the EMR today and reviewed under the history and medication portions of their EMR.    Recent Results (from the past 2160 hour(s))  VITAMIN D 25 Hydroxy (Vit-D Deficiency, Fractures)     Status: None   Collection Time: 11/08/17 12:20 PM  Result Value Ref Range   VITD 30.29 30.00 - 100.00 ng/mL  TSH     Status: None   Collection Time: 11/08/17 12:20 PM  Result Value Ref Range   TSH 4.32 0.35 - 4.50 uIU/mL  T4, free     Status: None   Collection Time: 11/08/17 12:20 PM  Result Value Ref Range   Free T4 0.65 0.60 - 1.60 ng/dL    Comment: Specimens from patients who are undergoing biotin therapy and /or ingesting biotin supplements may contain high levels of biotin.  The higher biotin concentration in these specimens interferes with this Free T4 assay.  Specimens that contain high levels  of biotin may cause false high results for this Free T4 assay.  Please interpret results in light of the total clinical presentation of the patient.    Comp Met (CMET)     Status: Abnormal   Collection Time: 11/29/17 10:55 AM  Result Value Ref Range   Sodium 138 135 - 145 mEq/L   Potassium 3.1 (L) 3.5 - 5.1 mEq/L   Chloride 97 96 - 112 mEq/L   CO2 30 19 - 32 mEq/L   Glucose, Bld 125 (H) 70 - 99 mg/dL  BUN 5 (L) 6 - 23 mg/dL   Creatinine, Ser 1.05 0.40 - 1.50 mg/dL   Total Bilirubin 1.4 (H) 0.2 - 1.2 mg/dL   Alkaline Phosphatase 67 39 - 117 U/L   AST 29 0 - 37 U/L   ALT 23 0 - 53 U/L   Total Protein 6.9 6.0 - 8.3 g/dL   Albumin 4.4 3.5 - 5.2 g/dL   Calcium 9.6 8.4 - 10.5 mg/dL   GFR 74.51 >60.00 mL/min  Basic metabolic panel     Status: Abnormal   Collection Time: 01/01/18  6:06 PM  Result Value Ref Range   Sodium 132 (L) 135 - 145 mmol/L   Potassium 2.8 (L) 3.5 - 5.1 mmol/L   Chloride 95 (L) 101 - 111 mmol/L   CO2 22 22 - 32 mmol/L   Glucose, Bld 156 (H) 65 - 99 mg/dL   BUN 5 (L) 6 -  20 mg/dL   Creatinine, Ser 1.11 0.61 - 1.24 mg/dL   Calcium 9.4 8.9 - 10.3 mg/dL   GFR calc non Af Amer >60 >60 mL/min   GFR calc Af Amer >60 >60 mL/min    Comment: (NOTE) The eGFR has been calculated using the CKD EPI equation. This calculation has not been validated in all clinical situations. eGFR's persistently <60 mL/min signify possible Chronic Kidney Disease.    Anion gap 15 5 - 15    Comment: Performed at [facility] [address] [place], [place] [address]  CBC     Status: Abnormal   Collection Time: 01/01/18  6:06 PM  Result Value Ref Range   WBC 4.4 4.0 - 10.5 K/uL   RBC 4.04 (L) 4.22 - 5.81 MIL/uL   Hemoglobin 14.0 13.0 - 17.0 g/dL   HCT 40.1 39.0 - 52.0 %   MCV 99.3 78.0 - 100.0 fL   MCH 34.7 (H) 26.0 - 34.0 pg   MCHC 34.9 30.0 - 36.0 g/dL   RDW 14.4 11.5 - 15.5 %   Platelets 218 150 - 400 K/uL    Comment: Performed at [facility] [address] [place], [name] [address]  I-stat troponin, ED     Status: None   Collection Time: 01/01/18  6:14 PM  Result Value Ref Range   Troponin i, poc 0.02 0.00 - 0.08 ng/mL   Comment 3            Comment: Due to the release kinetics of cTnI, a negative result within the first hours of the onset of symptoms does not rule out myocardial infarction with certainty. If myocardial infarction is still suspected, repeat the test at appropriate intervals.   D-dimer, quantitative (not at [facility])     Status: None   Collection Time: 01/01/18  6:56 PM  Result Value Ref Range   D-Dimer, Quant 0.29 0.00 - 0.50 ug/mL-FEU    Comment: (NOTE) At the manufacturer cut-off of 0.50 ug/mL FEU, this assay has been documented to exclude PE with a sensitivity and negative predictive value of 97 to 99%.  At this time, this assay has not been approved by the FDA to exclude DVT/VTE. Results should be correlated with clinical presentation. Performed at [facility], [place] [address] [place], [place] [address]   Hepatic  function panel     Status: Abnormal   Collection Time: 01/01/18  7:10 PM  Result Value Ref Range   Total Protein 6.4 (L) 6.5 - 8.1 g/dL   Albumin 3.9 3.5 -  5.0 g/dL   AST 48 (H) 15 - 41 U/L   ALT 27 17 - 63 U/L   Alkaline Phosphatase 76 38 - 126 U/L   Total Bilirubin 1.4 (H) 0.3 - 1.2 mg/dL   Bilirubin, Direct 0.3 0.1 - 0.5 mg/dL   Indirect Bilirubin 1.1 (H) 0.3 - 0.9 mg/dL    Comment: Performed at [facility] [address], [place], [place] [address]  Lipase, blood     Status: None   Collection Time: 01/01/18  7:10 PM  Result Value Ref Range   Lipase 28 11 - 51 U/L    Comment: Performed at [facility], [place] [address] [place], [place] [address]  Magnesium     Status: Abnormal   Collection Time: 01/01/18  7:44 PM  Result Value Ref Range   Magnesium 1.6 (L) 1.7 - 2.4 mg/dL    Comment: Performed at [facility] [address], [place], [place] [address]  I-Stat Troponin, ED (not at [facility])     Status: None   Collection Time: 01/01/18  9:24 PM  Result Value Ref Range   Troponin i, poc 0.00 0.00 - 0.08 ng/mL   Comment 3            Comment: Due to the release kinetics of cTnI, a negative result within the first hours of the onset of symptoms does not rule out myocardial infarction with certainty. If myocardial infarction is still suspected, repeat the test at appropriate intervals.   Pain Mgmt, Profile 8 w/Conf, U     Status: Abnormal   Collection Time: 01/03/18  9:55 AM  Result Value Ref Range   Creatinine 67.4 > or = 20. mg/dL   pH 6.64 4.5 - 9.0   Oxidant NEGATIVE <200 mcg/mL   Amphetamines NEGATIVE <500 ng/mL   medMATCH Amphetamines CONSISTENT    Benzodiazepines POSITIVE (A) <100 ng/mL   Alphahydroxyalprazolam NEGATIVE <25 ng/mL    Comment: See Note 1   medMATCH aOH alprazolam CONSISTENT    Alphahydroxymidazolam NEGATIVE <50 ng/mL    Comment: See Note 1   medMATCH aOH midazolam CONSISTENT    Alphahydroxytriazolam NEGATIVE <50 ng/mL    Comment: See  Note 1   medMATCH aOH triazolam CONSISTENT    Aminoclonazepam NEGATIVE <25 ng/mL    Comment: See Note 1   medMATCH Aminoclonazepam CONSISTENT    Hydroxyethylflurazepam NEGATIVE <50 ng/mL    Comment: See Note 1   medMATCH OH,Et flurazepam CONSISTENT    Lorazepam 1,096 (H) <50 ng/mL    Comment: See Note 1   medMATCH Lorazepam INCONSISTENT    Nordiazepam NEGATIVE <50 ng/mL    Comment: See Note 1   medMATCH Nordiazepam CONSISTENT    Oxazepam NEGATIVE <50 ng/mL    Comment: See Note 1   medMATCH Oxazepam CONSISTENT    Temazepam NEGATIVE <50 ng/mL    Comment: See Note 1   medMATCH Temazepam CONSISTENT    Marijuana Metabolite NEGATIVE <20 ng/mL   medMATCH Marijuana Metab CONSISTENT    Cocaine Metabolite NEGATIVE <150 ng/mL   medMATCH Cocaine Metab CONSISTENT    Opiates NEGATIVE <100 ng/mL   medMATCH Opiates CONSISTENT    Oxycodone NEGATIVE <100 ng/mL   medMATCH Oxycodone CONSISTENT     Comment: Note 1 . This test was developed and its analytical performance  characteristics have been determined by [employer]. It has not been cleared or approved by the FDA. This assay has been validated pursuant to the CLIA  regulations and is used for clinical purposes.    Buprenorphine, Urine NEGATIVE <5 ng/mL   medMATCH Buprenorphine CONSISTENT    MDMA NEGATIVE <500 ng/mL   [facility] MDMA CONSISTENT    Alcohol Metabolites NEGATIVE <500 ng/mL   medMATCH Alcohol Metab CONSISTENT    6 Acetylmorphine NEGATIVE <10 ng/mL   medMATCH 6 Acetylmorphine CONSISTENT     Comment: This drug testing is for medical treatment only.   Analysis was performed as non-forensic testing and  these results should be used only by healthcare  providers to render diagnosis or treatment, or to  monitor progress of medical conditions. Hazel Sams comments are:  - present when drug test results may be the result of     metabolism of one or more drugs or when results are     inconsistent with prescribed  medication(s) listed.  - may be blank when drug results are consistent with     prescribed medication(s) listed. . For assistance with interpreting these drug results,  please contact a [employer] Toxicology  Specialist: [phone] [place] [phone]), M-F,  8am-6pm EST. This drug testing is for medical treatment only.   Analysis was performed as non-forensic testing and  these results should be used only by healthcare  providers to render diagnosis or treatment, or to  monitor progress of medical conditions. Hazel Sams comments are:  - present when  drug test results may be the result of     metabolism of one or more drugs or when results are     inconsistent with prescribed medication(s) listed.  - may be blank when drug results are consistent with     prescribed medication(s) listed. . For assistance with interpreting these drug results,  please contact a [employer] Toxicology  Specialist: [phone] [place] [phone]), M-F,  8am-6pm EST. This drug testing is for medical treatment only.   Analysis was performed as non-forensic testing and  these results should be used only by healthcare  providers to render diagnosis or treatment, or to  monitor progress of medical conditions. Hazel Sams comments are:  - present when drug test results may be the result of     metabolism of one or more drugs or when results are     inconsistent with prescribed medication(s) listed.  - may be blank when drug results are consistent with     prescribed medication(s) listed. . For assistance with interpreting these d rug results,  please contact a [employer] Toxicology  Specialist: [phone] [place] [phone]), M-F,  8am-6pm EST. This drug testing is for medical treatment only.   Analysis was performed as non-forensic testing and  these results should be used only by healthcare  providers to render diagnosis or treatment, or to  monitor progress of medical  conditions. Hazel Sams comments are:  - present when drug test results may be the result of     metabolism of one or more drugs or when results are     inconsistent with prescribed medication(s) listed.  - may be blank when drug results are consistent with     prescribed medication(s) listed. . For assistance with interpreting these drug results,  please contact a [employer] Toxicology  Specialist: [phone] [place] [phone]), M-F,  8am-6pm EST. This drug testing is for medical treatment only.   Analysis was performed as non-forensic testing and  these results should be used only by healthcare  provi ders to render diagnosis or treatment, or to  monitor progress of medical conditions. Marland Kitchen  [facility] comments are:  - present when drug test results may be the result of     metabolism of one or more drugs or when results are     inconsistent with prescribed medication(s) listed.  - may be blank when drug results are consistent with     prescribed medication(s) listed. . For assistance with interpreting these drug results,  please contact a [employer] Toxicology  Specialist: [phone] [place] [phone]), M-F,  8am-6pm EST.   POCT Urinalysis Dipstick (Automated)     Status: None   Collection Time: 01/03/18  9:59 AM  Result Value Ref Range   Color, UA yellow    Clarity, UA clear    Glucose, UA negative    Bilirubin, UA negative    Ketones, UA negative    Spec Grav, UA 1.010 1.010 - 1.025   Blood, UA negative    pH, UA 6.0 5.0 - 8.0   Protein, UA negative    Urobilinogen, UA 0.2 0.2 or 1.0 E.U./dL   Nitrite, UA negative    Leukocytes, UA Negative Negative  Magnesium     Status: None   Collection Time: 01/03/18 10:23 AM  Result Value Ref Range   Magnesium 1.9 1.5 - 2.5 mg/dL  Comp Met (CMET)     Status: Abnormal   Collection Time: 01/03/18 10:23 AM  Result Value Ref Range   Sodium 133 (L) 135 - 145 mEq/L   Potassium 4.2 3.5 - 5.1 mEq/L   Chloride 93  (L) 96 - 112 mEq/L   CO2 30 19 - 32 mEq/L   Glucose, Bld 115 (H) 70 - 99 mg/dL   BUN 4 (L) 6 - 23 mg/dL   Creatinine, Ser 0.90 0.40 - 1.50 mg/dL   Total Bilirubin 1.1 0.2 - 1.2 mg/dL   Alkaline Phosphatase 70 39 - 117 U/L   AST 41 (H) 0 - 37 U/L   ALT 22 0 - 53 U/L   Total Protein 6.4 6.0 - 8.3 g/dL   Albumin 4.2 3.5 - 5.2 g/dL   Calcium 9.5 8.4 - 10.5 mg/dL   GFR 89.00 >60.00 mL/min    ROS: 14 pt review of systems performed and negative (unless mentioned in an HPI)  Objective: BP 138/79 (BP Location: Right Arm, Patient Position: Sitting, Cuff Size: Large)   Pulse 70   Temp 98.1 F (36.7 C)   Resp 20   Ht 5' 7"  (1.702 m)   Wt 237 lb (107.5 kg)   SpO2 97%   BMI 37.12 kg/m  Gen: Afebrile. No acute distress. Nontoxic. Obese male.  HENT: AT. [place].  MMM.  Eyes:Pupils Equal Round Reactive to light, Extraocular movements intact,  Conjunctiva without redness, discharge or icterus. CV: RRR no murmur, no edema, +2/4 P posterior tibialis pulses Chest: CTAB, no wheeze or crackles Abd: Soft. NTND. BS present. no Masses palpated.  Skin: no rashes, purpura or petechiae.  Neuro:  Normal gait. PERLA. EOMi. Alert. Oriented x3  Psych: Has a mild memory deficit. Normal affect, dress and demeanor. Normal speech. Normal thought content and judgment..    No results found for this or any previous visit (from the past 48 hour(s)).  Assessment/plan: [name] Flesher Sr. is a 67 y.o. male present for  major depression/anxiety/insomnia/OSA - Improved, sleeps through night.  - Continue Prozac to 60 mg daily  -  Trazodone did not work well for him.  - Continue seroquel 50 mg QHS. Refills provided today. Dose verified with wife. - continue ativan TID.  Patient is aware of controlled substance policy.  He signed a controlled substance agreement.  Ativan 1 mg 3 times daily scheduled--> no exceptions. - No etoh.  -  referred to Dr. Mamie Levers, uncertain if appt made - F/U 3 months.    Ulcerative colitis  with complication, unspecified location (HCC)/Intestinal malabsorption, unspecified type/low magnesium/hypokalemia - Loperamide he could take up to TID with flares.  - Continue Protonix -Follow-up with Dr. Watt Climes concerning Imuran. --> he now has an apt scheduled.   Moderate obstructive sleep apnea He now has an appointment set up to review sleep apnea results and obtained CPAP--> has not received machine yet, but did follow up.   Essential hypertension: - Increase amlodipine --> 10 mg QD. Dose verified with wife.  - Low-sodium diet.  Increase exercise. - F/U 3 months  Return in about 3 months (around 05/03/2018) for [facility].  Note is dictated utilizing voice recognition software. Although note has been proof read prior to signing, occasional typographical errors still can be missed. If any questions arise, please do not hesitate to call for verification.  Electronically signed by: Howard Pouch, DO [place]

## 2018-01-31 NOTE — Telephone Encounter (Signed)
Please call pts wife and make sure is taking the amlodipine total of 5 mg. If he is taking the 5 mg total, then we will be increasing again.  Also, please ask what dose of seroquel he is taking 1 or 2 tabs. So I can call in the right amount.   He was not certain on any of the above doses today.   Thanks.

## 2018-01-31 NOTE — Telephone Encounter (Signed)
Refilled Seroquel at 50 mg Qhs.  Increase amlodipine to 10 mg QD. She can give him two of the Amlodipine 5 mg a day (at same time) she has at home. New bottle called in of the increased amlodipine 10 mg (take 1 when he gets new script filled)- I have told the pharmacy to hold until requested since they just got refilled.  3 month follow up

## 2018-01-31 NOTE — Telephone Encounter (Signed)
Spoke with patient's wife she states patient is taking amlodipine 5 mg and his seroquel is 1 tablet at night. I let her know I would call her back to let her know what changes Dr [name] makes for his amlodipine dosage.

## 2018-01-31 NOTE — Patient Instructions (Signed)
Your BP is unchanged from last visit. We will check with your wife on dosage at home and then adjust    I am glad you are sleeping better on the Seroquel. I will call in refills after dose verified with your wife.   You look good.    Follow up 3 months as long as doing well.

## 2018-01-31 NOTE — Telephone Encounter (Signed)
Left message for patient's wife to return call

## 2018-01-31 NOTE — Telephone Encounter (Signed)
Left detailed message with information and instructions on patient's wifes voice mail per [facility]

## 2018-02-10 DIAGNOSIS — G4733 Obstructive sleep apnea (adult) (pediatric): Secondary | ICD-10-CM | POA: Diagnosis not present

## 2018-02-17 ENCOUNTER — Ambulatory Visit: Payer: Medicare HMO | Admitting: Neurology

## 2018-03-12 DIAGNOSIS — G4733 Obstructive sleep apnea (adult) (pediatric): Secondary | ICD-10-CM | POA: Diagnosis not present

## 2018-03-17 ENCOUNTER — Encounter: Payer: Self-pay | Admitting: Neurology

## 2018-04-07 ENCOUNTER — Encounter: Payer: Self-pay | Admitting: Neurology

## 2018-04-07 ENCOUNTER — Ambulatory Visit: Payer: Medicare HMO | Admitting: Neurology

## 2018-04-07 VITALS — BP 119/73 | HR 79 | Ht 67.0 in | Wt 254.0 lb

## 2018-04-07 DIAGNOSIS — G4733 Obstructive sleep apnea (adult) (pediatric): Secondary | ICD-10-CM | POA: Diagnosis not present

## 2018-04-07 DIAGNOSIS — F320 Major depressive disorder, single episode, mild: Secondary | ICD-10-CM

## 2018-04-07 DIAGNOSIS — R413 Other amnesia: Secondary | ICD-10-CM | POA: Diagnosis not present

## 2018-04-07 NOTE — Progress Notes (Signed)
[facility]    Provider:  Dr Jaynee Eagles Referring Provider: Josetta Huddle, MD Primary Care Physician:  [name], DO  CC:  Memory problems  Interval history 04/07/2018: Patient is here for follow-up of memory difficulties. PMHx anxiety, HTN, bells palsy, Major Depression, diabetes. Morbid obesity, orthostatic dizziness, low back pain, anxiety.  He was originally seen for memory changes in January of this year.  I recommended formal neurocognitive testing but does not appear as though he has had this completed.  He was also diagnosed with moderate obstructive sleep apnea with an O2 nadir of 77% and started CPAP but that since he did not enter REM sleep likely underestimates his AHI and O2 nadir.  Personally reviewed MRI of the brain images and agree that they show moderate generalized cortical atrophy more pronounced in the mesial temporal lobes which has occurred since MRI dated 2007, as well as chronic microvascular ischemic changes.  Reviewed report nuclear medicine PET scan which showed mild very subtle decreased relative cortical metabolism within the biparietal and temporal lobes compared to the frontal lobes which could indicate early Alzheimer's type pathology but is not definitive.  He has moderate sleep apnea but he is not tolerating the mask and the machine. He is feeling much better, Dr. Earle Gell and addressed his polypharmacy and he feels better. Also highly encouraged therapy for depression.   HPI:  [name] ROUT Sr. is a 67 y.o. male here as a referral from Dr. Inda Merlin for memory difficulties. PMHx anxiety, HTN, bells palsy, Major Depression, diabetes. Morbid obesity, orthostatic dizziness, low back pain, anxiety.  Wife ishere and provides much information. The last 7-10 months he has been forgetting things, he has an appointment and he will forget it completely. He is having difficulty with his medications. He feels like he is "losing his mind". Some days  better than others. Feels his short-term memory is worsening. He keeps thinking about his father who has passed away 21 years, he never knew his dad. Father drowned at the age of 80 with Alzheimers. She started with Alzheimers in her 65s. Worsening over the last 8 months per wife. He has no energy, he wants to sleep all the time. He doesn;t have the energy. 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 Significant depression, untreated until recently.  He is significantly tired during the day, never feels like he has rested sleep.  Reviewed notes, labs and imaging from outside physicians, which showed:  He has been more anxious and depressed lately. Also more confused. MMSE essentially normal. Medical problems over the last year, loose stools, on lorazepam and prozac. He has severe lo wback pain. Tramadol prn.   08/09/2017: Creatinine 0.99, BUN 5. ldl 126, triglycerides 219, tesh 4.22, hgba1c 6.   Review of Systems: Patient complains of symptoms per HPI as well as the following symptoms: depression, insomnia. Pertinent negatives and positives per HPI. All others negative.   Social History   Socioeconomic History  . Marital status: Married    Spouse name: [name]  . Number of children: 2  . Years of education: Not on file  . Highest education level: Some college, no degree  Occupational History  . Not on file  Social Needs  . Financial resource strain: Not on file  . Food insecurity:    Worry: Not on file    Inability: Not on file  .  Transportation needs:    Medical: Not on file    Non-medical: Not on file  Tobacco Use  . Smoking status: Former Smoker    Packs/day: 1.00    Years: 19.00    Pack years: 19.00    Last attempt to quit: 02/25/1982    Years since quitting: 36.1  . Smokeless tobacco: Former Systems developer    Quit date: 1990  Substance and Sexual Activity  . Alcohol use: Yes     Comment: occ  . Drug use: No  . Sexual activity: Yes    Partners: Female  Lifestyle  . Physical activity:    Days per week: Not on file    Minutes per session: Not on file  . Stress: Not on file  Relationships  . Social connections:    Talks on phone: Not on file    Gets together: Not on file    Attends religious service: Not on file    Active member of club or organization: Not on file    Attends meetings of clubs or organizations: Not on file    Relationship status: Not on file  . Intimate partner violence:    Fear of current or ex partner: Not on file    Emotionally abused: Not on file    Physically abused: Not on file    Forced sexual activity: Not on file  Other Topics Concern  . Not on file  Social History Narrative   Lives at home with wife [name].   College-educated. Works in Public house manager.     Family History  Problem Relation Age of Onset  . Dementia Mother   . Hypertension Mother   . Early death Father 51       drowning   . Post-traumatic stress disorder Brother   . Neuropathy Brother     Past Medical History:  Diagnosis Date  . Adenomatous colon polyp 2016  . Allergy   . Anxiety   . Bell palsy    resolved  . Chicken pox   . Depression   . Diet-controlled diabetes mellitus ([place])   . Eating disorder   . GERD (gastroesophageal reflux disease)   . History of vitamin D deficiency   . Hyperlipidemia   . Hypertension   . Hypokalemia   . MI (myocardial infarction) ([place])    per pt   . Ulcerative colitis ([place])   . Ulcerative colitis ([place]) 2005-2006   severe     Past Surgical History:  Procedure Laterality Date  . COLONOSCOPY WITH PROPOFOL N/A 07/15/2014   Procedure: COLONOSCOPY WITH PROPOFOL;  Surgeon: [name], MD;  Location: [facility];  Service: Endoscopy;  Laterality: N/A;  . COLONOSCOPY WITH PROPOFOL N/A 08/22/2015   Procedure: COLONOSCOPY WITH PROPOFOL;  Surgeon: [name], MD;  Location: [facility];  Service:  Endoscopy;  Laterality: N/A;  . ORIF ANKLE FRACTURE  02/27/2012   Procedure: OPEN REDUCTION INTERNAL FIXATION (ORIF) ANKLE FRACTURE;  Surgeon: Colin Rhein, MD;  Location: [facility];  Service: Orthopedics;  Laterality: Left;  ORIF left bimalleolar ankle fracture  . SHOULDER SURGERY     LEFT  . TONSILLECTOMY    . UPPER GASTROINTESTINAL ENDOSCOPY      Current Outpatient Medications  Medication Sig Dispense Refill  . amLODipine (NORVASC) 10 MG tablet Take 1 tablet (10 mg total) by mouth daily. 90 tablet 0  . azaTHIOprine (IMURAN) 50 MG tablet Take 200 mg by mouth every morning.     Marland Kitchen  Cyanocobalamin (VITAMIN B 12 PO) Take 1 tablet by mouth daily.    Marland Kitchen FLUoxetine (PROZAC) 20 MG capsule Take 60 mg by mouth daily.    . fluticasone (FLONASE) 50 MCG/ACT nasal spray Place 2 sprays into both nostrils daily. (Patient taking differently: Place 2 sprays into both nostrils daily as needed for allergies or rhinitis. ) 16 g 6  . levocetirizine (XYZAL) 5 MG tablet Take 0.5 tablets (2.5 mg total) by mouth every evening. 45 tablet 3  . loperamide (ANTI-DIARRHEAL) 2 MG tablet Take 1 tablet (2 mg total) by mouth daily as needed. (Patient taking differently: Take 2 mg by mouth daily as needed for diarrhea or loose stools. ) 90 tablet 1  . LORazepam (ATIVAN) 1 MG tablet Take 1 tablet (1 mg total) by mouth 3 (three) times daily. 90 tablet 5  . Multiple Vitamin (MULTIVITAMIN WITH MINERALS) TABS tablet Take 1 tablet by mouth daily.    . pantoprazole (PROTONIX) 20 MG tablet Take 1 tablet (20 mg total) by mouth daily. 90 tablet 3  . potassium chloride (K-DUR) 10 MEQ tablet Take 1 tablet (10 mEq total) by mouth daily. 90 tablet 3  . QUEtiapine (SEROQUEL) 50 MG tablet 50 mg (1 tab) QHS. May increase to 2 tabs QHS if needed after 7 days. 90 tablet 1  . aspirin 325 MG tablet Take 325 mg by mouth as needed.     No current facility-administered medications for this visit.     Allergies as of 04/07/2018 -  Review Complete 04/07/2018  Allergen Reaction Noted  . Losartan potassium-hctz Diarrhea 10/30/2017  . Zoloft [sertraline hcl] Diarrhea 11/11/2017    Vitals: BP 119/73 (BP Location: Right Arm, Patient Position: Sitting)   Pulse 79   Ht 5' 7"  (1.702 m)   Wt 254 lb (115.2 kg)   BMI 39.78 kg/m  Last Weight:  Wt Readings from Last 1 Encounters:  04/07/18 254 lb (115.2 kg)   Last Height:   Ht Readings from Last 1 Encounters:  04/07/18 5' 7"  (1.702 m)    Physical exam: Exam: Gen: NAD, conversant, well nourised, obese, well groomed                     CV: RRR, no MRG. No Carotid Bruits. No peripheral edema, warm, nontender Eyes: Conjunctivae clear without exudates or hemorrhage  Neuro: Detailed Neurologic Exam  Speech:    Speech is normal; fluent and spontaneous with normal comprehension.  Cognition:    The patient is oriented to person, place, and time;     recent and remote memory impaired;     language fluent;     Impaired attention, concentration    fund of knowledge intact Cranial Nerves:    The pupils are equal, round, and reactive to light. Attempted fundoscopic exam could not visualize.  Visual fields are full to finger confrontation. Extraocular movements are intact. Trigeminal sensation is intact and the muscles of mastication are normal. The face is symmetric. The palate elevates in the midline. Hearing intact. Voice is normal. Shoulder shrug is normal. The tongue has normal motion without fasciculations.   Coordination:    Normal finger to nose and heel to shin. Normal rapid alternating movements.   Gait:    Heel-toe and tandem gait are normal.   Motor Observation:    No asymmetry, no atrophy, and no involuntary movements noted. Tone:    Normal muscle tone.    Posture:    Posture is normal. normal erect  Strength:    Strength is V/V in the upper and lower limbs.      Sensation: intact to LT     Reflex Exam:  DTR's:    Deep tendon reflexes in the  upper and lower extremities are symmetrical bilaterally.   Toes:    The toes are downgoing bilaterally.   Clonus:    Clonus is absent.      Assessment/Plan:   67 y.o. male here as a follow up from Dr. Inda Merlin for memory difficulties. PMHx anxiety, HTN, bells palsy, Major Depression, diabetes. Morbid obesity, orthostatic dizziness, low back pain, anxiety, polypharmacy.   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 MMSE was 28/30 prior to exam, last visit was 24/30    - He  appears to have significant untreated depression. Referred him to Eino Farber at [place] to try and treat depression and counsel family   - MRI of the brain showed moderate generalized cortical atrophy more pronounced in the mesial temporal lobes which has occurred since MRI dated 2007, as well as chronic microvascular ischemic changes.   - PET scan which showed mild very subtle decreased relative cortical metabolism within the biparietal and temporal lobes compared to the frontal lobes which could indicate early Alzheimer's type pathology but is not definitive.  -  I recommended formal neurocognitive testing but does not appear as though he has had this completed.   - He was also diagnosed with moderate obstructive sleep apnea with an O2 nadir of 77% and started CPAP but that since he did not enter REM sleep likely underestimates his AHI and O2 nadir.  Orders Placed This Encounter  Procedures  . Ambulatory referral to Neuropsychology   - having difficulty with mask took him to our sleep lab for consult.   B12, tsh, rpr nml  Sarina Ill, MD  [facility] [address] [place], [place] [address]  Phone [phone] Fax [phone]  A total of 40 minutes was spent face-to-face with this patient. Over half  this time was spent on counseling patient on the memory loss, depression, OSA diagnosis and different diagnostic and therapeutic options, counseling and coordination of care, risks ans benefits of management, compliance, or risk factor reduction and education.

## 2018-04-07 NOTE — Patient Instructions (Signed)

## 2018-04-08 ENCOUNTER — Encounter: Payer: Self-pay | Admitting: Psychology

## 2018-04-12 DIAGNOSIS — G4733 Obstructive sleep apnea (adult) (pediatric): Secondary | ICD-10-CM | POA: Diagnosis not present

## 2018-04-22 ENCOUNTER — Encounter: Payer: Self-pay | Admitting: Neurology

## 2018-04-24 ENCOUNTER — Ambulatory Visit: Payer: Medicare HMO | Admitting: Neurology

## 2018-04-24 ENCOUNTER — Encounter: Payer: Self-pay | Admitting: Neurology

## 2018-04-24 VITALS — BP 155/87 | HR 76 | Ht 68.0 in | Wt 257.0 lb

## 2018-04-24 DIAGNOSIS — Z9989 Dependence on other enabling machines and devices: Secondary | ICD-10-CM | POA: Diagnosis not present

## 2018-04-24 DIAGNOSIS — G4733 Obstructive sleep apnea (adult) (pediatric): Secondary | ICD-10-CM | POA: Diagnosis not present

## 2018-04-24 NOTE — Patient Instructions (Addendum)
Please continue using your CPAP regularly. While your insurance requires that you use CPAP at least 4 hours each night on 70% of the nights, I recommend, that you not skip any nights and use it throughout the night if you can. Getting used to CPAP and staying with the treatment long term does take time and patience and discipline. Untreated obstructive sleep apnea when it is moderate to severe can have an adverse impact on cardiovascular health and raise her risk for heart disease, arrhythmias, hypertension, congestive heart failure, stroke and diabetes. Untreated obstructive sleep apnea causes sleep disruption, nonrestorative sleep, and sleep deprivation. This can have an impact on your day to day functioning and cause daytime sleepiness and impairment of cognitive function, memory loss, mood disturbance, and problems focussing. Using CPAP regularly can improve these symptoms.  Please call in mid September so we can pull another download. Please don't give up! You have done better lately and found a mask that you can tolerate.

## 2018-04-24 NOTE — Progress Notes (Signed)
herSubjective:    Patient ID: [name] Flesher Sr. is a 67 y.o. male.  HPI   [name] is a 67 year old right-handed gentleman with an underlying medical history of anxiety, depression, ulcerative colitis, memory loss, low back pain, hypertension, Bell's palsy, diabetes, and obesity, who presents for follow-up consultation of his obstructive sleep apnea, after sleep study testing and starting CPAP therapy at home. The patient is unaccompanied today. I first met him on 09/18/2017 at the request of Dr. Jaynee Eagles, at which time he reported significant daytime somnolence. His wife reported that he had pauses in his breathing and snoring. He was advised to proceed with a sleep study. He had a baseline sleep study on 09/30/2017, followed by a CPAP titration study. I went over his test results with him in detail today. Sleep study from 09/30/2017 showed a sleep latency of 11.5 minutes, REM sleep was absent. He had absence of slow-wave sleep. He had markedly increased percentages of light stage sleep only. His total AHI was 20.3 per hour. Average oxygen saturation was 94%, nadir was 77%. He had no significant PLMS. Based on his test results I advised him to proceed with a full night CPAP titration study. He had this on 01/13/2018. Sleep latency was 61 minutes, REM latency was 111 minutes. Sleep efficiency was 78.3%. He was fitted with nasal pillows and titrated on CPAP from 5 cm to 9 cm. On the final pressure his AHI was 0 per hour with brief supine REM sleep achieved an O2 nadir of 89%. He had no significant PLMS. I advised him to start CPAP therapy at home at a pressure of 10 cm.  Today, 04/24/2018: I reviewed his CPAP compliance data from 03/24/2018 through 04/22/2018 which is a total of 30 days, during which time he used his CPAP only 19 days with percent used days greater than 4 hours at 23%, indicating suboptimal compliance, average usage of 3 hours and 40 minutes only, residual AHI borderline at 5.1 per hour, leak  on the high side, pressure at 10 cm with EPR of 3. In the month of early June through early July his past 30 day compliance was 40% for usage of over 4 hours. He reports doing okay, finally found a mask that fits better, some improvement in his sleep in the last couple of nights, using a medium Dreamwear FFM. He is willing to continue. Wife has a CPAP and wants him to not give up either, she has done well with her CPAP. He was struggling for a long time with the mask discomfort and leakage. He is motivated to be consistent with his CPAP usage. In fact, the last 2 nights he slept better than before. His compliance data from the usage on 04/22/2018 indicates in fact the highest usage he has had thus far since set up date.   The patient's allergies, current medications, family history, past medical history, past social history, past surgical history and problem list were reviewed and updated as appropriate.   Previously:   09/18/2017: (He) reports excessive daytime somnolence. His Epworth sleepiness score is 18 out of 24, fatigue score is 25 out of 63. He does not believe he has dozed off at the wheel. His wife has reported to him that he snores and that he also has pauses in his breathing. She has sleep apnea and uses a CPAP machine. He is reluctant to consider coming in for a sleep study but would be willing if his insurance covers him for this and  he would be willing to consider CPAP therapy if the need arises. He has occasional restless legs symptoms, no nights night nocturia, no frequent morning headaches but has had rare morning headaches. He had a tonsillectomy as a child. Bedtime is around 11, rise time between 6 and 7. He has his own company, they install outdoor gas appliances. I reviewed your office note from 09/17/2017. He lives with his wife. He quit smoking in 1980 and does not utilize alcohol on a regular basis but does drink significant caffeine on a day-to-day basis including 6-7 bottles of soda,  3-5 glasses of iced tea, is not sure how much water he drinks.   His Past Medical History Is Significant For: Past Medical History:  Diagnosis Date  . Adenomatous colon polyp 2016  . Allergy   . Anxiety   . Bell palsy    resolved  . Chicken pox   . Depression   . Diet-controlled diabetes mellitus ([place])   . Eating disorder   . GERD (gastroesophageal reflux disease)   . History of vitamin D deficiency   . Hyperlipidemia   . Hypertension   . Hypokalemia   . MI (myocardial infarction) ([place])    per pt   . Ulcerative colitis ([place])   . Ulcerative colitis ([place]) 2005-2006   severe     His Past Surgical History Is Significant For: Past Surgical History:  Procedure Laterality Date  . COLONOSCOPY WITH PROPOFOL N/A 07/15/2014   Procedure: COLONOSCOPY WITH PROPOFOL;  Surgeon: [name], MD;  Location: [facility];  Service: Endoscopy;  Laterality: N/A;  . COLONOSCOPY WITH PROPOFOL N/A 08/22/2015   Procedure: COLONOSCOPY WITH PROPOFOL;  Surgeon: [name], MD;  Location: [facility];  Service: Endoscopy;  Laterality: N/A;  . ORIF ANKLE FRACTURE  02/27/2012   Procedure: OPEN REDUCTION INTERNAL FIXATION (ORIF) ANKLE FRACTURE;  Surgeon: Colin Rhein, MD;  Location: [facility];  Service: Orthopedics;  Laterality: Left;  ORIF left bimalleolar ankle fracture  . SHOULDER SURGERY     LEFT  . TONSILLECTOMY    . UPPER GASTROINTESTINAL ENDOSCOPY      His Family History Is Significant For: Family History  Problem Relation Age of Onset  . Dementia Mother   . Hypertension Mother   . Early death Father 73       drowning   . Post-traumatic stress disorder Brother   . Neuropathy Brother     His Social History Is Significant For: Social History   Socioeconomic History  . Marital status: Married    Spouse name: [name]  . Number of children: 2  . Years of education: Not on file  . Highest education level: Some college, no degree  Occupational History  . Not  on file  Social Needs  . Financial resource strain: Not on file  . Food insecurity:    Worry: Not on file    Inability: Not on file  . Transportation needs:    Medical: Not on file    Non-medical: Not on file  Tobacco Use  . Smoking status: Former Smoker    Packs/day: 1.00    Years: 19.00    Pack years: 19.00    Last attempt to quit: 02/25/1982    Years since quitting: 36.1  . Smokeless tobacco: Former Systems developer    Quit date: 1990  Substance and Sexual Activity  . Alcohol use: Yes    Comment: occ  . Drug use: No  . Sexual activity: Yes  Partners: Female  Lifestyle  . Physical activity:    Days per week: Not on file    Minutes per session: Not on file  . Stress: Not on file  Relationships  . Social connections:    Talks on phone: Not on file    Gets together: Not on file    Attends religious service: Not on file    Active member of club or organization: Not on file    Attends meetings of clubs or organizations: Not on file    Relationship status: Not on file  Other Topics Concern  . Not on file  Social History Narrative   Lives at home with wife [name].   College-educated. Works in Public house manager.     His Allergies Are:  Allergies  Allergen Reactions  . Losartan Potassium-Hctz Diarrhea  . Zoloft [Sertraline Hcl] Diarrhea  :   His Current Medications Are:  Outpatient Encounter Medications as of 04/24/2018  Medication Sig  . amLODipine (NORVASC) 10 MG tablet Take 1 tablet (10 mg total) by mouth daily.  Marland Kitchen aspirin 325 MG tablet Take 325 mg by mouth as needed.  Marland Kitchen azaTHIOprine (IMURAN) 50 MG tablet Take 200 mg by mouth every morning.   . Cyanocobalamin (VITAMIN B 12 PO) Take 1 tablet by mouth daily.  Marland Kitchen FLUoxetine (PROZAC) 20 MG capsule Take 60 mg by mouth daily.  . fluticasone (FLONASE) 50 MCG/ACT nasal spray Place 2 sprays into both nostrils daily. (Patient taking differently: Place 2 sprays into both nostrils daily as needed for allergies or rhinitis. )   . levocetirizine (XYZAL) 5 MG tablet Take 0.5 tablets (2.5 mg total) by mouth every evening.  . loperamide (ANTI-DIARRHEAL) 2 MG tablet Take 1 tablet (2 mg total) by mouth daily as needed. (Patient taking differently: Take 2 mg by mouth daily as needed for diarrhea or loose stools. )  . LORazepam (ATIVAN) 1 MG tablet Take 1 tablet (1 mg total) by mouth 3 (three) times daily.  . Multiple Vitamin (MULTIVITAMIN WITH MINERALS) TABS tablet Take 1 tablet by mouth daily.  . pantoprazole (PROTONIX) 20 MG tablet Take 1 tablet (20 mg total) by mouth daily.  . potassium chloride (K-DUR) 10 MEQ tablet Take 1 tablet (10 mEq total) by mouth daily.  . QUEtiapine (SEROQUEL) 50 MG tablet 50 mg (1 tab) QHS. May increase to 2 tabs QHS if needed after 7 days.   No facility-administered encounter medications on file as of 04/24/2018.   :  Review of Systems:  Out of a complete 14 point review of systems, all are reviewed and negative with the exception of these symptoms as listed below:      Review of Systems  Neurological:       Pt presents today to discuss his cpap. Pt finally found a good mask 2 days ago and has been able to tolerate the machine.    Objective:  Neurological Exam  Physical Exam Physical Examination:   Vitals:   04/24/18 1352  BP: (!) 155/87  Pulse: 76    General Examination: The patient is a very pleasant 67 y.o. male in no acute distress. He appears well-developed and well-nourished and well groomed. Good spirits.  HEENT: Normocephalic, atraumatic, pupils are equal, round and reactive to light and accommodation. Extraocular tracking is good without limitation to gaze excursion or nystagmus noted. Normal smooth pursuit is noted. Hearing is grossly intact. Face is symmetric with normal facial animation and normal facial sensation. Speech is clear with no dysarthria  noted. There is no hypophonia. There is no lip, neck/head, jaw or voice tremor. Neck with FROM. Oropharynx exam  reveals: mild mouth dryness, adequate dental hygiene with dentures on top, moderate airway crowding, tonsils are absent. Tongue protrudes centrally and palate elevates symmetrically.   Chest: Clear to auscultation without wheezing, rhonchi or crackles noted.  Heart: S1+S2+0, regular and normal without murmurs, rubs or gallops noted.   Abdomen: Soft, non-tender and non-distended with normal bowel sounds appreciated on auscultation.  Extremities: There is no pitting edema in the distal lower extremities bilaterally.   Skin: Warm and dry without trophic changes noted.  Musculoskeletal: exam reveals no obvious joint deformities, tenderness or joint swelling or erythema.   Neurologically:  Mental status: The patient is awake, alert and oriented in all 4 spheres. He is able to provide his own history. More talkative today.  Cranial nerves II - XII are as described above under HEENT exam. In addition: shoulder shrug is normal with equal shoulder height noted. Motor exam: Normal bulk, strength and tone is noted. There is no drift, tremor or rebound. Fine motor skills and coordination: grossly intact.   Cerebellar testing: No dysmetria or intention tremor. There is no truncal or gait ataxia.  Sensory exam: intact to light touch in the upper and lower extremities.  Gait, station and balance: He stands without difficulty, posture is age-appropriate. Gait is unremarkable.  Assessment and Plan:  In summary, KAIGE WHISTLER is a very pleasant 67 year old male with an underlying medical history of anxiety, depression, ulcerative colitis, memory loss, low back pain, hypertension, Bell's palsy, diabetes, and morbid obesity with a BMI of over 40, who presents for follow-up consultation of his obstructive sleep apnea after recent sleep study testing and starting CPAP therapy at home. His baseline sleep study from January 2019 indicated moderate obstructive sleep apnea but likely was an underestimation of  his obstructive sleep disordered breathing due to absence of REM sleep and poor sleep consolidation, reduced sleep efficiency. He did fairly well with CPAP therapy during his titration study in May 2019. He has started CPAP therapy in June but has been struggling with tolerance issues and ill fitting mask. He finally found in the past week a mask that fits better. His sleep in the past 2 nights has been considerably better and his usage is higher. He is motivated to continue with treatment and is advised to be fully compliant with CPAP therapy as he has in the least moderate obstructive sleep apnea. He is willing to be consistent with his usage. He is advised to follow-up routinely in a few months, in the meantime he is reminded to call us in one month so we can review another compliance download.  I again explained in particular the risks and ramifications of untreated moderate to severe OSA, especially with respect to developing cardiovascular disease down the Road, including congestive heart failure, difficult to treat hypertension, cardiac arrhythmias, or stroke. Even type 2 diabetes has, in part, been linked to untreated OSA. Symptoms of untreated OSA include daytime sleepiness, memory problems, mood irritability and mood disorder such as depression and anxiety, lack of energy, as well as recurrent headaches, especially morning headaches.  I explained the importance of CPAP compliance to him again today. He is in agreement. I answered all his questions today and reviewed his sleep study results and compliance data with him in detail. I spent 25 minutes in total face-to-face time with the patient, more than 50% of which was spent in  counseling and coordination of care, reviewing test results, reviewing medication and discussing or reviewing the diagnosis of OSA, its prognosis and treatment options. Pertinent laboratory and imaging test results that were available during this visit with the patient were  reviewed by me and considered in my medical decision making (see chart for details).

## 2018-04-30 ENCOUNTER — Telehealth: Payer: Self-pay | Admitting: Family Medicine

## 2018-04-30 NOTE — Telephone Encounter (Signed)
Noted.  That is the only pharmacy documented in his chart.

## 2018-04-30 NOTE — Telephone Encounter (Signed)
Copied from [place] [phone] [phone]. Topic: Quick Communication - See Telephone Encounter >> Apr 30, 2018 11:30 AM Ivar Drape wrote: CRM for notification. See Telephone encounter for: 04/30/18. Patient would like for ALL FUTURE prescriptions to be sent to his preferred pharmacy [facility] in [place].

## 2018-05-01 ENCOUNTER — Other Ambulatory Visit: Payer: Self-pay

## 2018-05-01 MED ORDER — FLUOXETINE HCL 20 MG PO CAPS: 60.0000 mg | ORAL_CAPSULE | Freq: Every day | ORAL | 0 refills | Status: DC

## 2018-05-01 MED ORDER — AMLODIPINE BESYLATE 10 MG PO TABS: 10.0000 mg | ORAL_TABLET | Freq: Every day | ORAL | 0 refills | Status: DC

## 2018-05-01 NOTE — Telephone Encounter (Signed)
Received request for fluoxetine 60 mg QD  and amlodipine 10 mg Qd. Pt was seen 3 months ago, per provider note,  amlodipine increased and pt asked to follow up in 3 mos--> which would be now.  We have also provided him with fluoxetine--> he is due for his follow up.  Ok to refill each for 30 days--> AND schedule him for follow up ASAP.  Thanks.

## 2018-05-01 NOTE — Telephone Encounter (Signed)
Patient has appointment 05/05/18 with Dr. [name].

## 2018-05-01 NOTE — Telephone Encounter (Signed)
Prescriptions sent for 1 month supply per PCP.

## 2018-05-05 ENCOUNTER — Ambulatory Visit: Payer: Medicare HMO | Admitting: Family Medicine

## 2018-05-13 DIAGNOSIS — G4733 Obstructive sleep apnea (adult) (pediatric): Secondary | ICD-10-CM | POA: Diagnosis not present

## 2018-05-14 ENCOUNTER — Ambulatory Visit (INDEPENDENT_AMBULATORY_CARE_PROVIDER_SITE_OTHER): Payer: Medicare HMO | Admitting: Family Medicine

## 2018-05-14 ENCOUNTER — Encounter: Payer: Self-pay | Admitting: Family Medicine

## 2018-05-14 VITALS — BP 128/76 | HR 76 | Temp 97.5°F | Resp 20 | Ht 68.0 in | Wt 259.0 lb

## 2018-05-14 DIAGNOSIS — I10 Essential (primary) hypertension: Secondary | ICD-10-CM | POA: Diagnosis not present

## 2018-05-14 DIAGNOSIS — G4700 Insomnia, unspecified: Secondary | ICD-10-CM

## 2018-05-14 DIAGNOSIS — F411 Generalized anxiety disorder: Secondary | ICD-10-CM

## 2018-05-14 DIAGNOSIS — Z6841 Body Mass Index (BMI) 40.0 and over, adult: Secondary | ICD-10-CM | POA: Diagnosis not present

## 2018-05-14 DIAGNOSIS — F339 Major depressive disorder, recurrent, unspecified: Secondary | ICD-10-CM

## 2018-05-14 DIAGNOSIS — Z0289 Encounter for other administrative examinations: Secondary | ICD-10-CM

## 2018-05-14 DIAGNOSIS — F1320 Sedative, hypnotic or anxiolytic dependence, uncomplicated: Secondary | ICD-10-CM

## 2018-05-14 DIAGNOSIS — Z79899 Other long term (current) drug therapy: Secondary | ICD-10-CM

## 2018-05-14 DIAGNOSIS — Z23 Encounter for immunization: Secondary | ICD-10-CM | POA: Diagnosis not present

## 2018-05-14 MED ORDER — QUETIAPINE FUMARATE 100 MG PO TABS: 100.0000 mg | ORAL_TABLET | Freq: Every day | ORAL | 1 refills | Status: DC

## 2018-05-14 MED ORDER — AMLODIPINE BESYLATE 10 MG PO TABS: 10.0000 mg | ORAL_TABLET | Freq: Every day | ORAL | 0 refills | Status: DC

## 2018-05-14 MED ORDER — FLUOXETINE HCL 20 MG PO CAPS: 60.0000 mg | ORAL_CAPSULE | Freq: Every day | ORAL | 1 refills | Status: DC

## 2018-05-14 MED ORDER — LORAZEPAM 1 MG PO TABS: 1.0000 mg | ORAL_TABLET | Freq: Three times a day (TID) | ORAL | 5 refills | Status: DC

## 2018-05-14 NOTE — Progress Notes (Signed)
Patient ID: [name] Flesher Sr., male  DOB: 10/02/1950, 67 y.o.   MRN: [id number] Patient Care Team    Relationship Specialty Notifications Start End  [name], DO PCP - General Family Medicine  11/08/17   [name], MD Consulting Physician Neurology  11/08/17   Clarene Essex, MD Consulting Physician Gastroenterology  11/29/17     Chief Complaint  Patient presents with  . Depression  . Hypertension    Subjective:  [name] HOFFER Sr. is a 67 y.o.  male present for follow up on chronic medical conditions and recent ED visit.  Major depression, recurrent, chronic (HCC)/Benzodiazepine dependence (HCC)/Insomnia, unspecified type/anxiety: Pt reports compliance with medications. He has been taking 100 mg of Seroquel lately and feels he is sleeping better. He still struggles with his CPAP, but is trying to wear and is doing better. He reports he is doing good on his depression and anxiety and does not want to be on additional medications or change medications.  Prescribed: fluoxetine 60 mg daily.  Lorazepam 1 mg 3 times daily.  Seroquel 100 QHS.  Tried trazodone, which was not effective.   Indication for controlled substance: anxiety Medication and dose: ativan 1 mg TID  # pills per month: 90 Last UDS date: 12/2017 appropiate contract signed (Y/N): Y Date narcotic database last reviewed (include red flags): 05/14/18   Class 3 severe obesity due to excess calories with body mass index (BMI) of 40.0 to 44.9 in adult, unspecified whether serious comorbidity present (HCC)/Essential hypertension Pt reports compliance with amlodipine 10 mg QD. Blood pressures ranges at home not checked. Patient denies chest pain, shortness of breath or lower extremity edema. Pt takes a daily ASA. Pt is not prescribed statin. BMP: 01/03/2018 sodium 133, chloride 93, 0.90, AST 41, calcium 4.2 CBC: 4/24 2019 within normal limits Diet: Does not routinely watch his diet TSH: 3/1/219 NL Lipid: 08/09/2017 t219,  HDL41, GOT157, Tg 261 Exercise: Does not routinely exercise RF: Hypertension, obesity, hyperlipidemia, diet-controlled diabetes  BMP Latest Ref Rng & Units 01/03/2018 01/01/2018 11/29/2017  Glucose 70 - 99 mg/dL 115(H) 156(H) 125(H)  BUN 6 - 23 mg/dL 4(L) 5(L) 5(L)  Creatinine 0.40 - 1.50 mg/dL 0.90 1.11 1.05  Sodium 135 - 145 mEq/L 133(L) 132(L) 138  Potassium 3.5 - 5.1 mEq/L 4.2 2.8(L) 3.1(L)  Chloride 96 - 112 mEq/L 93(L) 95(L) 97  CO2 19 - 32 mEq/L 30 22 30   Calcium 8.4 - 10.5 mg/dL 9.5 9.4 9.6   CBC Latest Ref Rng & Units 01/01/2018 08/09/2017 02/27/2012  WBC 4.0 - 10.5 K/uL 4.4 4.9 -  Hemoglobin 13.0 - 17.0 g/dL 14.0 14.1 13.3  Hematocrit 39.0 - 52.0 % 40.1 41 -  Platelets 150 - 400 K/uL 218 197 -    Depression screen [facility] 2/9 05/14/2018 11/29/2017 11/08/2017  Decreased Interest 1 - 1  Down, Depressed, Hopeless 3 3 2   PHQ - 2 Score 4 3 3   Altered sleeping 2 2 1   Tired, decreased energy 0 2 1  Change in appetite 0 2 1  Feeling bad or failure about yourself  1 1 1   Trouble concentrating 1 1 0  Moving slowly or fidgety/restless 0 0 1  Suicidal thoughts 0 - 0  PHQ-9 Score 8 11 8   Difficult doing work/chores Somewhat difficult Very difficult Very difficult   GAD 7 : Generalized Anxiety Score 11/08/2017  Nervous, Anxious, on Edge 2  Control/stop worrying 1  Worry too much - different things 1  Trouble relaxing 2  Restless 2  Easily annoyed or irritable 2  Afraid - awful might happen 1  Total GAD 7 Score 11       Fall Risk  04/24/2018 04/07/2018 11/08/2017 11/08/2017  Falls in the past year? Yes Yes Yes Yes  Number falls in past yr: 2 or more 2 or more 2 or more 2 or more  Injury with Fall? No No Yes Yes  Comment - - few scratches few scratches  Risk Factor Category  High Fall Risk High Fall Risk - -  Follow up Falls prevention discussed Falls prevention discussed - -   Immunization History  Administered Date(s) Administered  . Influenza-Unspecified 09/10/2017  . Pneumococcal  Polysaccharide-23 06/29/2015  . Tdap 03/17/2015    No exam data present  Past Medical History:  Diagnosis Date  . Adenomatous colon polyp 2016  . Allergy   . Anxiety   . Bell palsy    resolved  . Chicken pox   . Depression   . Diet-controlled diabetes mellitus ([place])   . Eating disorder   . GERD (gastroesophageal reflux disease)   . History of vitamin D deficiency   . Hyperlipidemia   . Hypertension   . Hypokalemia   . MI (myocardial infarction) ([place])    per pt   . Ulcerative colitis ([place])   . Ulcerative colitis ([place]) 2005-2006   severe    Allergies  Allergen Reactions  . Losartan Potassium-Hctz Diarrhea  . Zoloft [Sertraline Hcl] Diarrhea   Past Surgical History:  Procedure Laterality Date  . COLONOSCOPY WITH PROPOFOL N/A 07/15/2014   Procedure: COLONOSCOPY WITH PROPOFOL;  Surgeon: [name], MD;  Location: [facility];  Service: Endoscopy;  Laterality: N/A;  . COLONOSCOPY WITH PROPOFOL N/A 08/22/2015   Procedure: COLONOSCOPY WITH PROPOFOL;  Surgeon: [name], MD;  Location: [facility];  Service: Endoscopy;  Laterality: N/A;  . ORIF ANKLE FRACTURE  02/27/2012   Procedure: OPEN REDUCTION INTERNAL FIXATION (ORIF) ANKLE FRACTURE;  Surgeon: Colin Rhein, MD;  Location: [place];  Service: Orthopedics;  Laterality: Left;  ORIF left bimalleolar ankle fracture  . SHOULDER SURGERY     LEFT  . TONSILLECTOMY    . UPPER GASTROINTESTINAL ENDOSCOPY     Family History  Problem Relation Age of Onset  . Dementia Mother   . Hypertension Mother   . Early death Father 31       drowning   . Post-traumatic stress disorder Brother   . Neuropathy Brother    Social History   Socioeconomic History  . Marital status: Married    Spouse name: [name]  . Number of children: 2  . Years of education: Not on file  . Highest education level: Some college, no degree  Occupational History  . Not on file  Social Needs  . Financial resource strain: Not  on file  . Food insecurity:    Worry: Not on file    Inability: Not on file  . Transportation needs:    Medical: Not on file    Non-medical: Not on file  Tobacco Use  . Smoking status: Former Smoker    Packs/day: 1.00    Years: 19.00    Pack years: 19.00    Last attempt to quit: 02/25/1982    Years since quitting: 36.2  . Smokeless tobacco: Former Systems developer    Quit date: 1990  Substance and Sexual Activity  . Alcohol use: Yes    Comment: occ  . Drug  use: No  . Sexual activity: Yes    Partners: Female  Lifestyle  . Physical activity:    Days per week: Not on file    Minutes per session: Not on file  . Stress: Not on file  Relationships  . Social connections:    Talks on phone: Not on file    Gets together: Not on file    Attends religious service: Not on file    Active member of club or organization: Not on file    Attends meetings of clubs or organizations: Not on file    Relationship status: Not on file  . Intimate partner violence:    Fear of current or ex partner: Not on file    Emotionally abused: Not on file    Physically abused: Not on file    Forced sexual activity: Not on file  Other Topics Concern  . Not on file  Social History Narrative   Lives at home with wife [name].   College-educated. Works in Public house manager.    Allergies as of 05/14/2018      Reactions   Losartan Potassium-hctz Diarrhea   Zoloft [sertraline Hcl] Diarrhea      Medication List        Accurate as of 05/14/18 10:11 AM. Always use your most recent med list.          amLODipine 10 MG tablet Commonly known as:  NORVASC Take 1 tablet (10 mg total) by mouth daily.   aspirin 325 MG tablet Take 325 mg by mouth as needed.   azaTHIOprine 50 MG tablet Commonly known as:  IMURAN Take 200 mg by mouth every morning.   FLUoxetine 20 MG capsule Commonly known as:  PROZAC Take 3 capsules (60 mg total) by mouth daily.   fluticasone 50 MCG/ACT nasal spray Commonly known as:   FLONASE Place 2 sprays into both nostrils daily.   levocetirizine 5 MG tablet Commonly known as:  XYZAL Take 0.5 tablets (2.5 mg total) by mouth every evening.   loperamide 2 MG tablet Commonly known as:  IMODIUM A-D Take 1 tablet (2 mg total) by mouth daily as needed.   LORazepam 1 MG tablet Commonly known as:  ATIVAN Take 1 tablet (1 mg total) by mouth 3 (three) times daily.   multivitamin with minerals Tabs tablet Take 1 tablet by mouth daily.   pantoprazole 20 MG tablet Commonly known as:  PROTONIX Take 1 tablet (20 mg total) by mouth daily.   potassium chloride 10 MEQ tablet Commonly known as:  K-DUR Take 1 tablet (10 mEq total) by mouth daily.   QUEtiapine 100 MG tablet Commonly known as:  SEROQUEL Take 1 tablet (100 mg total) by mouth at bedtime.   VITAMIN B 12 PO Take 1 tablet by mouth daily.       All past medical history, surgical history, allergies, family history, immunizations andmedications were updated in the EMR today and reviewed under the history and medication portions of their EMR.    No results found for this or any previous visit (from the past 2160 hour(s)).  ROS: 14 pt review of systems performed and negative (unless mentioned in an HPI)  Objective: BP 128/76 (BP Location: Left Arm, Patient Position: Sitting, Cuff Size: Large)   Pulse 76   Temp (!) 97.5 F (36.4 C)   Resp 20   Ht 5' 8"  (1.727 m)   Wt 259 lb (117.5 kg)   SpO2 96%   BMI 39.38 kg/m  Gen: Afebrile. No acute distress. Nontoxic in appearence. Obese caucasian male.  HENT: AT. [place].  MMM.  Eyes:Pupils Equal Round Reactive to light, Extraocular movements intact,  Conjunctiva without redness, discharge or icterus. Neck/lymp/endocrine: Supple,no lymphadenopathy, no thyromegaly CV: RRR no murmur, no edema, +2/4 P posterior tibialis pulses Chest: CTAB, no wheeze or crackles Abd: Soft. obese. NTND. BS present.  Skin: no rashes, purpura or petechiae.  Neuro:  Normal gait. PERLA.  EOMi. Alert. Oriented. x3 Psych: Normal affect, dress and demeanor. Normal speech. Normal thought content and judgment.   No results found for this or any previous visit (from the past 48 hour(s)).  Assessment/plan: [name] Flesher Sr. is a 67 y.o. male present for  major depression/anxiety/insomnia/OSA -  Improved. Lengthy discussion today how he felt about addition of another med and he feels he is good were he is and "just has to face" the changes in life. He would like to keep med the same.  -  Continue Prozac to 60 mg daily  -  Trazodone did not work well for him.  -  Continue Seroquel 100 mg QD. He reports taking 2 - 50 MG tabs now and doing well. Refills provided in the 100 mg form, pharmacy to instruct pt on the difference, he was verbally told today and written on his AVS.  - Continue ativan TID.  Patient is aware of controlled substance policy.  He signed a controlled substance agreement.  Ativan 1 mg 3 times daily scheduled--> no exceptions. NCCS database reviewed 05/14/18  and appropriate.  - UDS: 01/03/2018 appropriate for Benzo use.  - No etoh.  -  referred to Dr. Mamie Levers, uncertain if appt made - F/U 6 months.    Essential hypertension: - BP stable, normal range.  Continue amlodipine 10 mg QD. Refills provided today. - Low-sodium diet.  Increase exercise. - F/U 6 months  Return in about 5 months (around 10/28/2018) for [facility].  Note is dictated utilizing voice recognition software. Although note has been proof read prior to signing, occasional typographical errors still can be missed. If any questions arise, please do not hesitate to call for verification.  Electronically signed by: Howard Pouch, DO [place]

## 2018-05-14 NOTE — Patient Instructions (Addendum)
I have refilled your medications today. Your blood pressure looks great. Seroquel- take one tab a night--- the dose pert ab is now more than your prior script so you do not have take multiple pills.   Follow up in 6 months for chronic medical conditions, unless needed sooner. Next appt we will complete labs for you.   You got your flu shot and your  last pneumonia shot.   Please help [place] help you:  We are honored you have chosen [place] for your Primary Care home. Below you will find basic instructions that you may need to access in the future. Please help [place] help you by reading the instructions, which cover many of the frequent questions we experience.   Prescription refills and request:  -In order to allow more efficient response time, please call your pharmacy for all refills. They will forward the request electronically to [place]. This allows for the quickest possible response. Request left on a nurse line can take longer to refill, since these are checked as time allows between office patients and other phone calls.  - refill request can take up to 3-5 working days to complete.  - If request is sent electronically and request is appropiate, it is usually completed in 1-2 business days.  - all patients will need to be seen routinely for all chronic medical conditions requiring prescription medications (see follow-up below). If you are overdue for follow up on your condition, you will be asked to make an appointment and we will call in enough medication to cover you until your appointment (up to 30 days).  - all controlled substances will require a face to face visit to request/refill.  - if you desire your prescriptions to go through a new pharmacy, and have an active script at original pharmacy, you will need to call your pharmacy and have scripts transferred to new pharmacy. This is completed between the pharmacy locations and not by your provider.    Results: If any images or labs  were ordered, it can take up to 1 week to get results depending on the test ordered and the lab/facility running and resulting the test. - Normal or stable results, which do not need further discussion, may be released to your mychart immediately with attached note to you. A call may not be generated for normal results. Please make certain to sign up for mychart. If you have questions on how to activate your mychart you can call the front office.  - If your results need further discussion, our office will attempt to contact you via phone, and if unable to reach you after 2 attempts, we will release your abnormal result to your mychart with instructions.  - All results will be automatically released in mychart after 1 week.  - Your provider will provide you with explanation and instruction on all relevant material in your results. Please keep in mind, results and labs may appear confusing or abnormal to the untrained eye, but it does not mean they are actually abnormal for you personally. If you have any questions about your results that are not covered, or you desire more detailed explanation than what was provided, you should make an appointment with your provider to do so.   Our office handles many outgoing and incoming calls daily. If we have not contacted you within 1 week about your results, please check your mychart to see if there is a message first and if not, then contact our office.  In helping with this matter, you help decrease call volume, and therefore allow [place] to be able to respond to patients needs more efficiently.   Acute office visits (sick visit):  An acute visit is intended for a new problem and are scheduled in shorter time slots to allow schedule openings for patients with new problems. This is the appropriate visit to discuss a new problem. Problems will not be addressed by phone call or Echart message. Appointment is needed if requesting treatment. In order to provide you with  excellent quality medical care with proper time for you to explain your problem, have an exam and receive treatment with instructions, these appointments should be limited to one new problem per visit. If you experience a new problem, in which you desire to be addressed, please make an acute office visit, we save openings on the schedule to accommodate you. Please do not save your new problem for any other type of visit, let us take care of it properly and quickly for you.   Follow up visits:  Depending on your condition(s) your provider will need to see you routinely in order to provide you with quality care and prescribe medication(s). Most chronic conditions (Example: hypertension, Diabetes, depression/anxiety... etc), require visits a couple times a year. Your provider will instruct you on proper follow up for your personal medical conditions and history. Please make certain to make follow up appointments for your condition as instructed. Failing to do so could result in lapse in your medication treatment/refills. If you request a refill, and are overdue to be seen on a condition, we will always provide you with a 30 day script (once) to allow you time to schedule.    Medicare wellness (well visit): - we have a wonderful Nurse Maudie Mercury), that will meet with you and provide you will yearly medicare wellness visits. These visits should occur yearly (can not be scheduled less than 1 calendar year apart) and cover preventive health, immunizations, advance directives and screenings you are entitled to yearly through your medicare benefits. Do not miss out on your entitled benefits, this is when medicare will pay for these benefits to be ordered for you.  These are strongly encouraged by your provider and is the appropriate type of visit to make certain you are up to date with all preventive health benefits. If you have not had your medicare wellness exam in the last 12 months, please make certain to schedule one  by calling the office and schedule your medicare wellness with Maudie Mercury as soon as possible.   Yearly physical (well visit):  - Adults are recommended to be seen yearly for physicals. Check with your insurance and date of your last physical, most insurances require one calendar year between physicals. Physicals include all preventive health topics, screenings, medical exam and labs that are appropriate for gender/age and history. You may have fasting labs needed at this visit. This is a well visit (not a sick visit), new problems should not be covered during this visit (see acute visit).  - Pediatric patients are seen more frequently when they are younger. Your provider will advise you on well child visit timing that is appropriate for your their age. - This is not a medicare wellness visit. Medicare wellness exams do not have an exam portion to the visit. Some medicare companies allow for a physical, some do not allow a yearly physical. If your medicare allows a yearly physical you can schedule the medicare wellness with our nurse Maudie Mercury  and have your physical with your provider after, on the same day. Please check with insurance for your full benefits.   Late Policy/No Shows:  - all new patients should arrive 15-30 minutes earlier than appointment to allow [place] time  to  obtain all personal demographics,  insurance information and for you to complete office paperwork. - All established patients should arrive 10-15 minutes earlier than appointment time to update all information and be checked in .  - In our best efforts to run on time, if you are late for your appointment you will be asked to either reschedule or if able, we will work you back into the schedule. There will be a wait time to work you back in the schedule,  depending on availability.  - If you are unable to make it to your appointment as scheduled, please call 24 hours ahead of time to allow [place] to fill the time slot with someone else who needs to be  seen. If you do not cancel your appointment ahead of time, you may be charged a no show fee.

## 2018-05-26 ENCOUNTER — Other Ambulatory Visit: Payer: Self-pay | Admitting: *Deleted

## 2018-05-26 MED ORDER — AMLODIPINE BESYLATE 10 MG PO TABS: 10.0000 mg | ORAL_TABLET | Freq: Every day | ORAL | 0 refills | Status: DC

## 2018-06-05 ENCOUNTER — Encounter: Payer: Self-pay | Admitting: Family Medicine

## 2018-06-05 ENCOUNTER — Ambulatory Visit (INDEPENDENT_AMBULATORY_CARE_PROVIDER_SITE_OTHER): Payer: Medicare HMO | Admitting: Family Medicine

## 2018-06-05 VITALS — BP 134/72 | HR 72 | Temp 98.3°F | Resp 16 | Ht 68.0 in | Wt 262.1 lb

## 2018-06-05 DIAGNOSIS — J0100 Acute maxillary sinusitis, unspecified: Secondary | ICD-10-CM | POA: Diagnosis not present

## 2018-06-05 MED ORDER — AMOXICILLIN 875 MG PO TABS: 875.0000 mg | ORAL_TABLET | Freq: Two times a day (BID) | ORAL | 0 refills | Status: AC

## 2018-06-05 NOTE — Progress Notes (Signed)
OFFICE VISIT  06/05/2018   CC:  Chief Complaint  Patient presents with  . URI   HPI:    Patient is a 67 y.o. Caucasian male who presents for respiratory symptoms. Greater than 1 week of nasal congestion, PND, sinus fullness/pressure, lots of white/thick mucuous, some coughing.   No fevers.  No n/v/d.   No wheezing.  No SOB. He takes azathioprine for UC--since 2004.   Past Medical History:  Diagnosis Date  . Adenomatous colon polyp 2016  . Allergy   . Anxiety   . Bell palsy    resolved  . Chicken pox   . Depression   . Diet-controlled diabetes mellitus ([place])   . Eating disorder   . GERD (gastroesophageal reflux disease)   . History of vitamin D deficiency   . Hyperlipidemia   . Hypertension   . Hypokalemia   . MI (myocardial infarction) ([place])    per pt   . Ulcerative colitis ([place])   . Ulcerative colitis ([place]) 2005-2006   severe     Past Surgical History:  Procedure Laterality Date  . COLONOSCOPY WITH PROPOFOL N/A 07/15/2014   Procedure: COLONOSCOPY WITH PROPOFOL;  Surgeon: [name], MD;  Location: [facility];  Service: Endoscopy;  Laterality: N/A;  . COLONOSCOPY WITH PROPOFOL N/A 08/22/2015   Procedure: COLONOSCOPY WITH PROPOFOL;  Surgeon: [name], MD;  Location: [facility];  Service: Endoscopy;  Laterality: N/A;  . ORIF ANKLE FRACTURE  02/27/2012   Procedure: OPEN REDUCTION INTERNAL FIXATION (ORIF) ANKLE FRACTURE;  Surgeon: Colin Rhein, MD;  Location: [facility];  Service: Orthopedics;  Laterality: Left;  ORIF left bimalleolar ankle fracture  . SHOULDER SURGERY     LEFT  . TONSILLECTOMY    . UPPER GASTROINTESTINAL ENDOSCOPY      Outpatient Medications Prior to Visit  Medication Sig Dispense Refill  . amLODipine (NORVASC) 10 MG tablet Take 1 tablet (10 mg total) by mouth daily. 90 tablet 0  . aspirin 325 MG tablet Take 325 mg by mouth as needed.    Marland Kitchen azaTHIOprine (IMURAN) 50 MG tablet Take 200 mg by mouth every morning.      . Cyanocobalamin (VITAMIN B 12 PO) Take 1 tablet by mouth daily.    Marland Kitchen FLUoxetine (PROZAC) 20 MG capsule Take 3 capsules (60 mg total) by mouth daily. 270 capsule 1  . fluticasone (FLONASE) 50 MCG/ACT nasal spray Place 2 sprays into both nostrils daily. (Patient taking differently: Place 2 sprays into both nostrils daily as needed for allergies or rhinitis. ) 16 g 6  . levocetirizine (XYZAL) 5 MG tablet Take 0.5 tablets (2.5 mg total) by mouth every evening. 45 tablet 3  . loperamide (ANTI-DIARRHEAL) 2 MG tablet Take 1 tablet (2 mg total) by mouth daily as needed. (Patient taking differently: Take 2 mg by mouth daily as needed for diarrhea or loose stools. ) 90 tablet 1  . LORazepam (ATIVAN) 1 MG tablet Take 1 tablet (1 mg total) by mouth 3 (three) times daily. 90 tablet 5  . Multiple Vitamin (MULTIVITAMIN WITH MINERALS) TABS tablet Take 1 tablet by mouth daily.    . pantoprazole (PROTONIX) 20 MG tablet Take 1 tablet (20 mg total) by mouth daily. 90 tablet 3  . potassium chloride (K-DUR) 10 MEQ tablet Take 1 tablet (10 mEq total) by mouth daily. 90 tablet 3  . QUEtiapine (SEROQUEL) 100 MG tablet Take 1 tablet (100 mg total) by mouth at bedtime. 90 tablet 1   No  facility-administered medications prior to visit.     Allergies  Allergen Reactions  . Losartan Potassium-Hctz Diarrhea  . Zoloft [Sertraline Hcl] Diarrhea    ROS As per HPI  PE: Blood pressure 134/72, pulse 72, temperature 98.3 F (36.8 C), temperature source Oral, resp. rate 16, height 5' 8"  (1.727 m), weight 262 lb 2 oz (118.9 kg), SpO2 94 %. VS: noted--normal. Gen: alert, NAD, NONTOXIC APPEARING. HEENT: eyes without injection, drainage, or swelling.  Ears: EACs clear, TMs with normal light reflex and landmarks.  Nose: Clear rhinorrhea, with some dried, crusty exudate adherent to mildly injected mucosa.  No purulent d/c.  Mild ethmoid sinus TTP bilat.  No facial swelling.  Throat and mouth without focal lesion.  No pharyngial  swelling, erythema, or exudate.   Neck: supple, no LAD.   LUNGS: CTA bilat, nonlabored resps.   CV: RRR, no m/r/g. EXT: no c/c/e SKIN: no rash    LABS:    Chemistry      Component Value Date/Time   NA 133 (L) 01/03/2018 1023   NA 137 08/09/2017   K 4.2 01/03/2018 1023   CL 93 (L) 01/03/2018 1023   CO2 30 01/03/2018 1023   BUN 4 (L) 01/03/2018 1023   BUN 5 08/09/2017   CREATININE 0.90 01/03/2018 1023   GLU 110 08/09/2017      Component Value Date/Time   CALCIUM 9.5 01/03/2018 1023   ALKPHOS 70 01/03/2018 1023   AST 41 (H) 01/03/2018 1023   ALT 22 01/03/2018 1023   BILITOT 1.1 01/03/2018 1023      IMPRESSION AND PLAN:  Acute sinusitis, relatively immunosuppressed pt (azathioprine use). Amoxil 826m bid x 10d. Get otc generic robitussin DM OR Mucinex DM and use as directed on the packaging for cough and congestion. Use otc generic saline nasal spray 2-3 times per day to irrigate/moisturize your nasal passages.  An After Visit Summary was printed and given to the patient.  FOLLOW UP: Return if symptoms worsen or fail to improve.  Signed:  PCrissie Sickles MD           06/05/2018

## 2018-06-05 NOTE — Patient Instructions (Signed)
Get otc generic robitussin DM OR Mucinex DM and use as directed on the packaging for cough and congestion. Use otc generic saline nasal spray 2-3 times per day to irrigate/moisturize your nasal passages.   

## 2018-06-12 DIAGNOSIS — G4733 Obstructive sleep apnea (adult) (pediatric): Secondary | ICD-10-CM | POA: Diagnosis not present

## 2018-07-01 DIAGNOSIS — M25512 Pain in left shoulder: Secondary | ICD-10-CM | POA: Diagnosis not present

## 2018-07-07 ENCOUNTER — Ambulatory Visit: Payer: Self-pay

## 2018-07-07 ENCOUNTER — Encounter: Payer: Self-pay | Admitting: Family Medicine

## 2018-07-07 ENCOUNTER — Ambulatory Visit (HOSPITAL_BASED_OUTPATIENT_CLINIC_OR_DEPARTMENT_OTHER)
Admission: RE | Admit: 2018-07-07 | Discharge: 2018-07-07 | Disposition: A | Discharge status: Home / Self Care | Payer: Medicare HMO | Source: Ambulatory Visit | Attending: Family Medicine | Admitting: Family Medicine

## 2018-07-07 ENCOUNTER — Ambulatory Visit (INDEPENDENT_AMBULATORY_CARE_PROVIDER_SITE_OTHER): Payer: Medicare HMO | Admitting: Family Medicine

## 2018-07-07 VITALS — BP 149/76 | HR 73 | Resp 16 | Ht 68.0 in | Wt 265.0 lb

## 2018-07-07 DIAGNOSIS — R270 Ataxia, unspecified: Secondary | ICD-10-CM

## 2018-07-07 DIAGNOSIS — S92424A Nondisplaced fracture of distal phalanx of right great toe, initial encounter for closed fracture: Secondary | ICD-10-CM | POA: Insufficient documentation

## 2018-07-07 DIAGNOSIS — X58XXXA Exposure to other specified factors, initial encounter: Secondary | ICD-10-CM | POA: Diagnosis not present

## 2018-07-07 DIAGNOSIS — R410 Disorientation, unspecified: Secondary | ICD-10-CM | POA: Diagnosis not present

## 2018-07-07 DIAGNOSIS — S92421A Displaced fracture of distal phalanx of right great toe, initial encounter for closed fracture: Secondary | ICD-10-CM | POA: Diagnosis not present

## 2018-07-07 DIAGNOSIS — R197 Diarrhea, unspecified: Secondary | ICD-10-CM | POA: Diagnosis not present

## 2018-07-07 DIAGNOSIS — S99921A Unspecified injury of right foot, initial encounter: Secondary | ICD-10-CM | POA: Diagnosis not present

## 2018-07-07 DIAGNOSIS — S0990XA Unspecified injury of head, initial encounter: Secondary | ICD-10-CM | POA: Diagnosis not present

## 2018-07-07 DIAGNOSIS — S060X0A Concussion without loss of consciousness, initial encounter: Secondary | ICD-10-CM

## 2018-07-07 DIAGNOSIS — R42 Dizziness and giddiness: Secondary | ICD-10-CM | POA: Diagnosis not present

## 2018-07-07 DIAGNOSIS — M19071 Primary osteoarthritis, right ankle and foot: Secondary | ICD-10-CM | POA: Insufficient documentation

## 2018-07-07 HISTORY — DX: Concussion without loss of consciousness, initial encounter: S06.0X0A

## 2018-07-07 NOTE — Progress Notes (Signed)
[name] SEBEK Sr. , 1951/02/09, 67 y.o., male MRN: [id number] Patient Care Team    Relationship Specialty Notifications Start End  [name], DO PCP - General Family Medicine  11/08/17   [name], MD Consulting Physician Neurology  11/08/17   Clarene Essex, MD Consulting Physician Gastroenterology  11/29/17     Chief Complaint  Patient presents with  . Dizziness     Subjective: Pt presents for an OV with complaints of dizziness of 3 days duration over the weekend.  States he fell at the beach, hit his head on concrete and broke toes. He did not get seen for this event. He reports a mechanical fall in the middle of night, when he had to use the bathroom. He reports he was sharing a bathroom with another man on the trip and that person had left something on the floor. He tripped and grabbed on to the shower curtain. He hit his head on the tile/cement floor. He also injured his right foot and 2-3rd toes. He denies LOC. He reports immediate dizziness that also remained daily since. Dizziness is worse with bending over and transitioning positions. His wife states he has also been very "wobbly"  When walking as well. She reports he was unable to tell her the name of the beach and how he fell initially when her returned yesterday. He also endorses falling at the golf course after. He hit the crown of his head.  He is not on ASA.  Pt has tried nothing to ease their symptoms.  Established with neuro Dr. Jaynee Eagles.    NM PET brain: 10/25/2017  IMPRESSION: 1. Mild (very subtle ) decreased relative cortical metabolism within the biparietal and temporal lobes compared to the frontal lobes. This could indicate early Alzheimer's type pathology but is not definitive. 2. Normal cortical metabolism within the occipital lobe.  MRI brain 10/04/2017: IMPRESSION:  This MRI of the brain without contrast shows the following: 1.    Mild to moderate generalized cortical atrophy, more pronounced in the  mesial temporal lobes. The atrophy has occurred since the MRI dated 12/03/2005. 2.    Chronic microvascular ischemic change that has occurred since the 2007 MRI 3.   There are no acute findings.   Depression screen [facility] 2/9 05/14/2018 11/29/2017 11/08/2017  Decreased Interest 1 - 1  Down, Depressed, Hopeless _0 PHQ - 2 Score _1 Altered sleeping _2 Tired, decreased energy 0 2 1  Change in appetite 0 2 1  Feeling bad or failure about yourself  _3 Trouble concentrating 1 1 0  Moving slowly or fidgety/restless 0 0 1  Suicidal thoughts 0 - 0  PHQ-9 Score _4 Difficult doing work/chores Somewhat difficult Very difficult Very difficult    Allergies  Allergen Reactions  . Losartan Potassium-Hctz Diarrhea  . Zoloft [Sertraline Hcl] Diarrhea   Social History   Tobacco Use  . Smoking status: Former Smoker    Packs/day: 1.00    Years: 19.00    Pack years: 19.00    Last attempt to quit: 02/25/1982    Years since quitting: 36.3  . Smokeless tobacco: Former Systems developer    Quit date: 1990  Substance Use Topics  . Alcohol use: Yes    Comment: occ   Past Medical History:  Diagnosis Date  . Adenomatous colon polyp 2016  . Allergy   . Anxiety   . Bell palsy  resolved  . Chicken pox   . Depression   . Diet-controlled diabetes mellitus ([place])   . Eating disorder   . GERD (gastroesophageal reflux disease)   . History of vitamin D deficiency   . Hyperlipidemia   . Hypertension   . Hypokalemia   . MI (myocardial infarction) ([place])    per pt   . Ulcerative colitis ([place])   . Ulcerative colitis ([place]) 2005-2006   severe    Past Surgical History:  Procedure Laterality Date  . COLONOSCOPY WITH PROPOFOL N/A 07/15/2014   Procedure: COLONOSCOPY WITH PROPOFOL;  Surgeon: [name], MD;  [facility];  Service: Endoscopy;  Laterality: N/A;  . COLONOSCOPY WITH PROPOFOL N/A 08/22/2015   Procedure: COLONOSCOPY WITH PROPOFOL;  Surgeon: [name], MD;  Location:  [facility];  Service: Endoscopy;  Laterality: N/A;  . ORIF ANKLE FRACTURE  02/27/2012   Procedure: OPEN REDUCTION INTERNAL FIXATION (ORIF) ANKLE FRACTURE;  Surgeon: Colin Rhein, MD;  Location: [facility];  Service: Orthopedics;  Laterality: Left;  ORIF left bimalleolar ankle fracture  . SHOULDER SURGERY     LEFT  . TONSILLECTOMY    . UPPER GASTROINTESTINAL ENDOSCOPY     Family History  Problem Relation Age of Onset  . Dementia Mother   . Hypertension Mother   . Early death Father 64       drowning   . Post-traumatic stress disorder Brother   . Neuropathy Brother    Allergies as of 07/07/2018      Reactions   Losartan Potassium-hctz Diarrhea   Zoloft [sertraline Hcl] Diarrhea      Medication List        Accurate as of 07/07/18  3:04 PM. Always use your most recent med list.          amLODipine 10 MG tablet Commonly known as:  NORVASC Take 1 tablet (10 mg total) by mouth daily.   aspirin 325 MG tablet Take 325 mg by mouth as needed.   azaTHIOprine 50 MG tablet Commonly known as:  IMURAN Take 200 mg by mouth every morning.   FLUoxetine 20 MG capsule Commonly known as:  PROZAC Take 3 capsules (60 mg total) by mouth daily.   fluticasone 50 MCG/ACT nasal spray Commonly known as:  FLONASE Place 2 sprays into both nostrils daily.   levocetirizine 5 MG tablet Commonly known as:  XYZAL Take 0.5 tablets (2.5 mg total) by mouth every evening.   loperamide 2 MG tablet Commonly known as:  IMODIUM A-D Take 1 tablet (2 mg total) by mouth daily as needed.   LORazepam 1 MG tablet Commonly known as:  ATIVAN Take 1 tablet (1 mg total) by mouth 3 (three) times daily.   multivitamin with minerals Tabs tablet Take 1 tablet by mouth daily.   pantoprazole 20 MG tablet Commonly known as:  PROTONIX Take 1 tablet (20 mg total) by mouth daily.   potassium chloride 10 MEQ tablet Commonly known as:  K-DUR Take 1 tablet (10 mEq total) by mouth daily.     QUEtiapine 100 MG tablet Commonly known as:  SEROQUEL Take 1 tablet (100 mg total) by mouth at bedtime.   VITAMIN B 12 PO Take 1 tablet by mouth daily.       All past medical history, surgical history, allergies, family history, immunizations andmedications were updated in the EMR today and reviewed under the history and medication portions of their EMR.     ROS: Negative, with the exception  of above mentioned in HPI   Objective:  BP (!) 149/76 (BP Location: Left Arm, Patient Position: Sitting, Cuff Size: Large)   Pulse 73   Resp 16   Ht 5' 8" (1.727 m)   Wt 265 lb (120.2 kg)   SpO2 96%   BMI 40.29 kg/m  Body mass index is 40.29 kg/m. Gen: Afebrile. No acute distress. Nontoxic in appearance, well developed, well nourished.  HENT: AT. [place]. Bilateral TM visualized without erythema, fullness. MMM, no oral lesions. Scalp without swelling or bruising. No TTP scalp.  Eyes:Pupils Equal Round Reactive to light, Extraocular movements intact,  Conjunctiva without redness, discharge or icterus. Fundoscopic exam w/o hemorrhage. Negative battle sign, racoon eyes or hemotympanum.  CV: RRR no murmur, no edema Chest: CTAB, no wheeze or crackles.  Skin: no rashes, purpura or petechiae.  Neuro/MSK:  Unstable gait. Mild confusion surrounding event.  PERLA. EOMi. Alert. Oriented x3 Cranial nerves II through XII intact. Muscle strength 5/5 bilateral U/L ext. Normal cervical ROM, mild dizziness with ext. No pain. Neg romberg. Delay in finger to nose. Heel to shin normal. No pronator drift. TTP right distal 2nd metatarsal with bruising 2nd toe and base of toe nail. Good cap refill. Range of motion good. Pain with lift off of foot in stride.  Psych: Normal affect, dress and demeanor. Normal speech. Normal thought content and judgment.  No exam data present No results found. No results found for this or any previous visit (from the past 24 hour(s)).  Assessment/Plan: [name] Flesher Sr. is a 67 y.o.  male present for OV for  Traumatic injury of head, initial encounter/Ataxia/Dizziness/Confusion - concerning HPI with head injury on hard surface, with ataxia, confusion and dizziness. CT head STAT to rule out any intracranial event/injury. He has an mildly unsteady gait today, and has continue dizziness since event. He was unable to drive himself today 2/2 to symptoms.  - CT Head Wo Contrast; Future-- STAT--> if abnl will need to be admitted through ED. If normal will treat as a concussion with close follow up. Concussion instructions provided to him today.  - CBC - Comp Met (CMET) - close f/u for head injury and BP recheck (consider increasing regimen).  Injury of right foot, initial encounter TTP right 2nd toe, will weight bearing pain. Has fx this toe in the past as well so there is a deformity.  - rest, elevate--await xray for further plan.  - DG Foot Complete Right; Future    Reviewed expectations re: course of current medical issues.  Discussed self-management of symptoms.  Outlined signs and symptoms indicating need for more acute intervention.  Patient verbalized understanding and all questions were answered.  Patient received an After-Visit Summary.    No orders of the defined types were placed in this encounter.    Note is dictated utilizing voice recognition software. Although note has been proof read prior to signing, occasional typographical errors still can be missed. If any questions arise, please do not hesitate to call for verification.   electronically signed by:  Howard Pouch, DO  [place]

## 2018-07-07 NOTE — Patient Instructions (Signed)
Please have CT today. Diane is setting up for you.  At the very least, this is a concussion. We will call you with results.  In the meantime, rest. If symptoms worsen please go to ED    Concussion, Adult A concussion is a brain injury from a direct hit (blow) to the head or body. This injury causes the brain to shake quickly back and forth inside the skull. It is caused by:  A hit to the head.  A quick and sudden movement (jolt) of the head or neck.  How fast you will get better from a concussion depends on many things like how bad your concussion was, what part of your brain was hurt, how old you are, and how healthy you were before the concussion. Recovery can take time. It is important to wait to return to activity until a doctor says it is safe and your symptoms are all gone. Follow these instructions at home: Activity  Limit activities that need a lot of thought or concentration. These include: ? Homework or work for your job. ? Watching TV. ? Computer work. ? Playing memory games and puzzles.  Rest. Rest helps the brain to heal. Make sure you: ? Get plenty of sleep at night. Do not stay up late. ? Go to bed at the same time every day. ? Rest during the day. Take naps or rest breaks when you feel tired.  It can be dangerous if you get another concussion before the first one has healed Do not do activities that could cause a second concussion, such as riding a bike or playing sports.  Ask your doctor when you can return to your normal activities, like driving, riding a bike, or using machinery. Your ability to react may be slower. Do not do these activities if you are dizzy. Your doctor will likely give you a plan for slowly going back to activities. General instructions  Take over-the-counter and prescription medicines only as told by your doctor.  Do not drink alcohol until your doctor says you can.  If it is harder than usual to remember things, write them down.  If you  are easily distracted, try to do one thing at a time. For example, do not try to watch TV while making dinner.  Talk with family members or close friends when you need to make important decisions.  Watch your symptoms and tell other people to do the same. Other problems (complications) can happen after a concussion. Older adults with a brain injury may have a higher risk of serious problems, such as a blood clot in the brain.  Tell your teachers, school nurse, school counselor, coach, Product/process development scientist, or work Freight forwarder about your injury and symptoms. Tell them about what you can or cannot do. They should watch for: ? More problems with attention or concentration. ? More trouble remembering or learning new information. ? More time needed to do tasks or assignments. ? Being more annoyed (irritable) or having a harder time dealing with stress. ? Any other symptoms that get worse.  Keep all follow-up visits as told by your health care provider. This is important. Prevention  It is very important that you donot get another brain injury, especially before you have healed. In rare cases, another injury can cause permanent brain damage, brain swelling, or death. You have the most risk if you get another head injury in the first 7-10 days after you were hurt before. To avoid injuries: ? Wear a seat  belt when you ride in a car. ? Do not drink too much alcohol. ? Avoid activities that could make you get a second concussion, like contact sports. ? Wear a helmet when you do activities like:  Biking.  Skiing.  Skateboarding.  Skating. ? Make your home safe by:  Removing things from the floor or stairs that could make you trip.  Using grab bars in bathrooms and handrails by stairs.  Placing non-slip mats on floors and in bathtubs.  Putting more light in dark areas. Contact a doctor if:  Your symptoms get worse.  You have new symptoms.  You keep having symptoms for more than 2 weeks. Get  help right away if:  You have bad headaches, or your headaches get worse.  You have weakness in any part of your body.  You have loss of feeling (numbness).  You feel off balance.  You keep throwing up (vomiting).  You feel more sleepy.  The black center of one eye (pupil) is bigger than the other one.  You twitch or shake violently (convulse) or have a seizure.  Your speech is not clear (is slurred).  You feel more tired, more confused, or more annoyed.  You do not recognize people or places.  You have neck pain.  It is hard to wake you up.  You have strange behavior changes.  You pass out (lose consciousness). Summary  A concussion is a brain injury from a direct hit (blow) to the head or body.  This condition is treated with rest and careful watching of symptoms.  If you keep having symptoms for more than 2 weeks, call your doctor. This information is not intended to replace advice given to you by your health care provider. Make sure you discuss any questions you have with your health care provider. Document Released: 08/15/2009 Document Revised: 08/11/2016 Document Reviewed: 08/11/2016 Elsevier Interactive Patient Education  2017 [employer].

## 2018-07-07 NOTE — Telephone Encounter (Signed)
Wife reports over the weekend pt. Started having dizzy spells. Mainly with standing and changing positions. States he does have a history of inner ear. States he reports to her he fell over the weekend in the bathroom. No obvious injury, but she thinks he could have hit his head. No obvious head injury. Appointment made for today with his provider. Instructed if symptoms worsen to call back or go to ED. Verbalizes understanding.  Reason for Disposition . [1] MODERATE dizziness (e.g., interferes with normal activities) AND [2] has NOT been evaluated by physician for this  (Exception: dizziness caused by heat exposure, sudden standing, or poor fluid intake)  Answer Assessment - Initial Assessment Questions 1. DESCRIPTION: "Describe your dizziness."     Dizzy 2. LIGHTHEADED: "Do you feel lightheaded?" (e.g., somewhat faint, woozy, weak upon standing)     Dizzy 3. VERTIGO: "Do you feel like either you or the room is spinning or tilting?" (i.e. vertigo)     No 4. SEVERITY: "How bad is it?"  "Do you feel like you are going to faint?" "Can you stand and walk?"   - MILD - walking normally   - MODERATE - interferes with normal activities (e.g., work, school)    - SEVERE - unable to stand, requires support to walk, feels like passing out now.      mild 5. ONSET:  "When did the dizziness begin?"     Started yesterday 6. AGGRAVATING FACTORS: "Does anything make it worse?" (e.g., standing, change in head position)     Changing positions 7. HEART RATE: "Can you tell me your heart rate?" "How many beats in 15 seconds?"  (Note: not all patients can do this)       Unsure 8. CAUSE: "What do you think is causing the dizziness?"     Unsure - maybe inner ear 9. RECURRENT SYMPTOM: "Have you had dizziness before?" If so, ask: "When was the last time?" "What happened that time?"     Yes 10. OTHER SYMPTOMS: "Do you have any other symptoms?" (e.g., fever, chest pain, vomiting, diarrhea, bleeding)       No 11.  PREGNANCY: "Is there any chance you are pregnant?" "When was your last menstrual period?"       n/a  Protocols used: DIZZINESS [facility]

## 2018-07-08 ENCOUNTER — Telehealth: Payer: Self-pay | Admitting: Family Medicine

## 2018-07-08 LAB — COMPREHENSIVE METABOLIC PANEL
AG Ratio: 1.9 (calc) (ref 1.0–2.5)
ALT: 22 U/L (ref 9–46)
AST: 28 U/L (ref 10–35)
Albumin: 4.4 g/dL (ref 3.6–5.1)
Alkaline phosphatase (APISO): 55 U/L (ref 40–115)
BUN: 7 mg/dL (ref 7–25)
CO2: 28 mmol/L (ref 20–32)
Calcium: 9.6 mg/dL (ref 8.6–10.3)
Chloride: 99 mmol/L (ref 98–110)
Creat: 1.04 mg/dL (ref 0.70–1.25)
GLUCOSE: 91 mg/dL (ref 65–99)
Globulin: 2.3 g/dL (calc) (ref 1.9–3.7)
Potassium: 4.3 mmol/L (ref 3.5–5.3)
Sodium: 139 mmol/L (ref 135–146)
Total Bilirubin: 0.9 mg/dL (ref 0.2–1.2)
Total Protein: 6.7 g/dL (ref 6.1–8.1)

## 2018-07-08 LAB — CBC
HCT: 39 % (ref 38.5–50.0)
Hemoglobin: 13.8 g/dL (ref 13.2–17.1)
MCH: 35.4 pg — ABNORMAL HIGH (ref 27.0–33.0)
MCHC: 35.4 g/dL (ref 32.0–36.0)
MCV: 100 fL (ref 80.0–100.0)
MPV: 11.3 fL (ref 7.5–12.5)
PLATELETS: 191 10*3/uL (ref 140–400)
RBC: 3.9 10*6/uL — AB (ref 4.20–5.80)
RDW: 14.4 % (ref 11.0–15.0)
WBC: 7.6 10*3/uL (ref 3.8–10.8)

## 2018-07-08 NOTE — Telephone Encounter (Signed)
Please inform patient the following information: Pt does have a very small fracture of his large or 1 st toe (not the one that was painful and bruised). It is small, and if continues to improve over next few weeks, no need to refer to ortho. He should keep it elevated and avoid walking a lot next week. If worsening follow up or be seen at an ortho urgent care.   His labs are stable. He should continue to take a b-complex vitamin (not just b12).  Follow up here in 1 week (60mn) to recheck on his concussion- if still having symptoms will refer to neurology.  - at this follow up we will also be rechecking his BP and add to his regimen if needing more coverage. And if he feels his depression is worsening we can address at that time also (his wife stated it was worse).

## 2018-07-08 NOTE — Telephone Encounter (Signed)
Left detailed message with results and instructions on patient voice mail per DPR instructed patient to call back to schedule follow up appt for next week.

## 2018-07-10 ENCOUNTER — Other Ambulatory Visit: Payer: Self-pay | Admitting: *Deleted

## 2018-07-10 DIAGNOSIS — R413 Other amnesia: Secondary | ICD-10-CM

## 2018-07-13 DIAGNOSIS — G4733 Obstructive sleep apnea (adult) (pediatric): Secondary | ICD-10-CM | POA: Diagnosis not present

## 2018-08-04 DIAGNOSIS — M1711 Unilateral primary osteoarthritis, right knee: Secondary | ICD-10-CM | POA: Diagnosis not present

## 2018-08-04 DIAGNOSIS — M1712 Unilateral primary osteoarthritis, left knee: Secondary | ICD-10-CM | POA: Diagnosis not present

## 2018-08-11 ENCOUNTER — Ambulatory Visit: Payer: Self-pay | Admitting: *Deleted

## 2018-08-11 NOTE — Telephone Encounter (Signed)
Pt's wife (on [place]) called stating her husband has dizziness and sits in a dark room. She could not answer all the questions so pt was called.  Pt states that he fell getting into a shower the end of October/first of Nov. He hit his head on concrete. He did go to the ED and did see his pcp in October. He had a CT scan of his head and per chart was advised to come back in 2 weeks if not any better or worst. Pt stated that he did not hear that.  He states his head spins and feels like when he would hit his head while playing football. He denies nausea and vomiting. Light does not bother his eyes. He states it is not getting any better. His wife states he does a lot of sleeping. No availability to make an appointment with Dr. [name] until Monday. Left message at the concussion clinic at [facility] also.  Pt is requesting a call back from his pcp this week.  Advised that if it gets worst to go to the ED at the nearest hospital. Pt voiced understanding. Reason for Disposition . [1] MODERATE dizziness (e.g., interferes with normal activities) AND [2] has been evaluated by physician for this  Answer Assessment - Initial Assessment Questions 1. DESCRIPTION: "Describe your dizziness."     The room spinning 2. LIGHTHEADED: "Do you feel lightheaded?" (e.g., somewhat faint, woozy, weak upon standing)     yes 3. VERTIGO: "Do you feel like either you or the room is spinning or tilting?" (i.e. vertigo)     Some spinning 4. SEVERITY: "How bad is it?"  "Do you feel like you are going to faint?" "Can you stand and walk?"   - MILD - walking normally   - MODERATE - interferes with normal activities (e.g., work, school)    - SEVERE - unable to stand, requires support to walk, feels like passing out now.      Moderate to severe at times 5. ONSET:  "When did the dizziness begin?"     The end of October. 1st of November 6. AGGRAVATING FACTORS: "Does anything make it worse?" (e.g., standing, change in head position)  Standing first thing in the morning and change in head of position 7. HEART RATE: "Can you tell me your heart rate?" "How many beats in 15 seconds?"  (Note: not all patients can do this)       no 8. CAUSE: "What do you think is causing the dizziness?"     Fell getting into the shower and hit head on cement floor 9. RECURRENT SYMPTOM: "Have you had dizziness before?" If so, ask: "When was the last time?" "What happened that time?"     No for this long but has had concussion playing football 10. OTHER SYMPTOMS: "Do you have any other symptoms?" (e.g., fever, chest pain, vomiting, diarrhea, bleeding)       no  Protocols used: DIZZINESS [facility]

## 2018-08-12 ENCOUNTER — Telehealth: Payer: Self-pay

## 2018-08-12 DIAGNOSIS — G4733 Obstructive sleep apnea (adult) (pediatric): Secondary | ICD-10-CM | POA: Diagnosis not present

## 2018-08-12 NOTE — Telephone Encounter (Signed)
Spoke with patient who states that he fell one month ago in the shower. He did not lose consciousness. Did have headaches and photophobia but those symptoms have subsided. Dizziness has persisted. Patient on schedule on Thursday.

## 2018-08-12 NOTE — Telephone Encounter (Signed)
Spoke to patient, offered him an appointment for 08/13/18 at 11 15 am. Patient stated that he will call neurologist and discuss with him first. He will call back if he needs to be seen by our office.

## 2018-08-14 ENCOUNTER — Ambulatory Visit: Payer: Medicare HMO | Admitting: Family Medicine

## 2018-08-25 DIAGNOSIS — M1712 Unilateral primary osteoarthritis, left knee: Secondary | ICD-10-CM | POA: Diagnosis not present

## 2018-08-25 DIAGNOSIS — M1711 Unilateral primary osteoarthritis, right knee: Secondary | ICD-10-CM | POA: Diagnosis not present

## 2018-09-12 ENCOUNTER — Encounter: Payer: Medicare HMO | Attending: Psychology | Admitting: Psychology

## 2018-09-12 DIAGNOSIS — F331 Major depressive disorder, recurrent, moderate: Secondary | ICD-10-CM

## 2018-09-12 DIAGNOSIS — F411 Generalized anxiety disorder: Secondary | ICD-10-CM | POA: Insufficient documentation

## 2018-09-12 DIAGNOSIS — R413 Other amnesia: Secondary | ICD-10-CM | POA: Diagnosis not present

## 2018-09-12 DIAGNOSIS — G4733 Obstructive sleep apnea (adult) (pediatric): Secondary | ICD-10-CM | POA: Diagnosis not present

## 2018-09-19 ENCOUNTER — Other Ambulatory Visit: Payer: Self-pay

## 2018-09-19 MED ORDER — AMLODIPINE BESYLATE 10 MG PO TABS: 10.0000 mg | ORAL_TABLET | Freq: Every day | ORAL | 0 refills | Status: DC

## 2018-09-19 NOTE — Telephone Encounter (Signed)
Pt pharmacy is requesting a rf on pt Amlodipine. Will send in rf that is on hold per note in chart.

## 2018-09-24 DIAGNOSIS — H35033 Hypertensive retinopathy, bilateral: Secondary | ICD-10-CM | POA: Diagnosis not present

## 2018-09-24 DIAGNOSIS — H2513 Age-related nuclear cataract, bilateral: Secondary | ICD-10-CM | POA: Diagnosis not present

## 2018-09-24 DIAGNOSIS — H40013 Open angle with borderline findings, low risk, bilateral: Secondary | ICD-10-CM | POA: Diagnosis not present

## 2018-10-06 ENCOUNTER — Telehealth: Payer: Self-pay

## 2018-10-06 NOTE — Telephone Encounter (Signed)
Received notice from [facility] that since pt has not met compliance requirements for his cpap, his cpap will need to be picked up and the process restarted, including F2F and sleep study.   I called pt. He does not want to continue using cpap nor restart the process to get another one. He wants to discuss this further with his wife, however. I also advised him that he has 2 office visits with our office on 10/28/18 and one of these will need to be cancelled. He advised me that he will ask his wife to call us back to discuss this.

## 2018-10-07 DIAGNOSIS — G4733 Obstructive sleep apnea (adult) (pediatric): Secondary | ICD-10-CM | POA: Diagnosis not present

## 2018-10-08 ENCOUNTER — Ambulatory Visit: Payer: Medicare HMO | Admitting: Neurology

## 2018-10-08 DIAGNOSIS — D123 Benign neoplasm of transverse colon: Secondary | ICD-10-CM | POA: Diagnosis not present

## 2018-10-08 DIAGNOSIS — R197 Diarrhea, unspecified: Secondary | ICD-10-CM | POA: Diagnosis not present

## 2018-10-08 DIAGNOSIS — K6289 Other specified diseases of anus and rectum: Secondary | ICD-10-CM | POA: Diagnosis not present

## 2018-10-08 DIAGNOSIS — K635 Polyp of colon: Secondary | ICD-10-CM | POA: Diagnosis not present

## 2018-10-08 DIAGNOSIS — Z8719 Personal history of other diseases of the digestive system: Secondary | ICD-10-CM | POA: Diagnosis not present

## 2018-10-08 DIAGNOSIS — K5140 Inflammatory polyps of colon without complications: Secondary | ICD-10-CM | POA: Diagnosis not present

## 2018-10-08 DIAGNOSIS — K5289 Other specified noninfective gastroenteritis and colitis: Secondary | ICD-10-CM | POA: Diagnosis not present

## 2018-10-08 DIAGNOSIS — K6389 Other specified diseases of intestine: Secondary | ICD-10-CM | POA: Diagnosis not present

## 2018-10-08 HISTORY — PX: SIGMOIDOSCOPY: SUR1295

## 2018-10-08 LAB — HM SIGMOIDOSCOPY

## 2018-10-08 NOTE — Telephone Encounter (Signed)
I called pt again to discuss. No answer at the home number, left a message asking him to call me back.

## 2018-10-09 ENCOUNTER — Encounter: Payer: Self-pay | Admitting: Psychology

## 2018-10-10 DIAGNOSIS — K635 Polyp of colon: Secondary | ICD-10-CM | POA: Diagnosis not present

## 2018-10-10 DIAGNOSIS — K5140 Inflammatory polyps of colon without complications: Secondary | ICD-10-CM | POA: Diagnosis not present

## 2018-10-10 DIAGNOSIS — D123 Benign neoplasm of transverse colon: Secondary | ICD-10-CM | POA: Diagnosis not present

## 2018-10-10 DIAGNOSIS — K5289 Other specified noninfective gastroenteritis and colitis: Secondary | ICD-10-CM | POA: Diagnosis not present

## 2018-10-13 ENCOUNTER — Encounter: Payer: Medicare HMO | Admitting: Psychology

## 2018-10-13 DIAGNOSIS — G4733 Obstructive sleep apnea (adult) (pediatric): Secondary | ICD-10-CM | POA: Diagnosis not present

## 2018-10-14 NOTE — Telephone Encounter (Signed)
I called pt. He reports that his wife has ordered him new supplies and got a new hose. He wants to try to keep using cpap for now. h e asked me to cancel his appt with Dr. Jaynee Eagles on 10/28/2018 because he is going to have neuro psych testing done. He did not want to reschedule with Dr. Jaynee Eagles at this time. I reminded him of his appt with Dr. Rexene Alberts on 10/28/2018. Pt verbalized understanding.

## 2018-10-15 NOTE — Telephone Encounter (Signed)
Received this note from [employer]: "[employer] will not reauth patients equipment. He needs in lab study and new notes to requalify." Will continue to discuss at his 10/28/2018 appt.

## 2018-10-18 DIAGNOSIS — H524 Presbyopia: Secondary | ICD-10-CM | POA: Diagnosis not present

## 2018-10-18 DIAGNOSIS — H52221 Regular astigmatism, right eye: Secondary | ICD-10-CM | POA: Diagnosis not present

## 2018-10-19 ENCOUNTER — Encounter: Payer: Self-pay | Admitting: Psychology

## 2018-10-19 NOTE — Progress Notes (Signed)
Neuropsychological Consultation   Patient:   [name] FELDNER Sr.   DOB:   [birth year]  MR Number:  [id number]  Location:  [facility] [place], [place] [id number] [place] [address] Dept: [phone]           Date of Service:   09/12/2018  Start Time:   8 AM End Time:   9 AM  Provider/Observer:  Ilean Skill, Psy.D.       Clinical Neuropsychologist       Billing Code/Service: 62952 4 Units  Chief Complaint:    [name], Sr. is a 68 year old male referred by Dr. Jaynee Eagles, who is his neurologist, for neuropsychological evaluation due to increasing problems with memory, confusion, and depression.  The patient has some indications of generalized cortical atrophy more pronounced in the temporal lobes as well as chronic microvascular ischemic changes.  There were subtle decreased relative cortical metabolism within the biparietal and temporal lobes compared to frontal lobes found on the recent PET scan as well.  These PET scan findings could be indicative of early Alzheimer's type pathology but the results were not definitive.  Reason for Service:  [name], Sr. is a 68 year old male referred by Dr. Jaynee Eagles, who is his neurologist, for neuropsychological evaluation due to increasing problems with memory, confusion, and depression.  The patient has some indications of generalized cortical atrophy more pronounced in the temporal lobes as well as chronic microvascular ischemic changes.  There were subtle decreased relative cortical metabolism within the biparietal and temporal lobes compared to frontal lobes found on the recent PET scan as well.  These PET scan findings could be indicative of early Alzheimer's type pathology but the results were not definitive.  The patient has a number of medical issues including hypertension, history of Bell's palsy, diabetes, morbid obesity,  orthostatic dizziness, low back pain, and obstructive sleep apnea.  The patient also has a history of anxiety and major depression and has been receiving psychopharmacologic care for his depression and anxiety.  The patient describes a significant concussion that occurred on November 1 of 2019 while on a golf trip he fell in the shower and hit his head on the tile floor.  The patient does remember falling and being in pain.  He describes an altered consciousness but no full loss of consciousness.  However, the patient reports that his memory difficulties and confusion etc. were existing prior to this.  The patient reports that he feels like he is always been a "do it myself" person his entire life but now his physical and mental capacities are having a significant negative impact on this.  The patient reports that he has difficulty doing things are figuring out how to do things and just cannot complete tasks.  The patient reports that he gets very frustrated about this and gets very upset and and depressed about this loss of function.  The patient reports that he will forget appointments and other events.  The patient's wife was also present for the clinical interview.  The patient's wife reports that over the years she is identified increasing memory problems and changes.  She reports that the patient does not remember what he does during the day and she also describes some geographic disorientation as well.  She reports that he will often ask her how to get places he has been several times in the past.  She reports that he  appears to worsen at nighttime.  The patient reports that he feels like he is not sleeping well at night and does not feel like he is having any dream cycles but his wife reports that the patient seems to be sleeping all the time.  The patient reports that his appetite is good.  The patient's wife reports that she is very worried about his progressive decline and the impact it is having on  his emotional and psychological wellbeing.  Current Status:  The patient is described to be having increasing memory difficulties, confusion, geographic disorientation, frustration, depression and anxiety.  Reliability of Information: The information is derived from 1 hour face-to-face clinical interview as well as review of available medical records.  Behavioral Observation: [name] SPIKER Sr.  presents as a 68 y.o.-year-old Right Caucasian Male who appeared his stated age. his dress was Appropriate and he was Well Groomed and his manners were Appropriate to the situation.  his participation was indicative of Appropriate and Redirectable behaviors.  There were not any physical disabilities noted.  he displayed an appropriate level of cooperation and motivation.     Interactions:    Active Appropriate and Redirectable  Attention:   abnormal and attention span appeared shorter than expected for age  Memory:   abnormal; remote memory intact, recent memory impaired  Visuo-spatial:  not examined  Speech (Volume):  normal  Speech:   normal;   Thought Process:  Coherent and Relevant  Though Content:  WNL; not suicidal and not homicidal  Orientation:   person, place and situation  Judgment:   Fair  Planning:   Fair  Affect:    Anxious and Depressed  Mood:    Dysphoric  Insight:   Fair  Intelligence:   normal  Marital Status/Living: The patient was born and raised in [place] and has 1 sibling.  The patient currently lives with his wife of 53 years.  The patient was married 1 time previously for duration of 4 years.  The patient has a 79 year old son and a 40 year old son.  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  The patient's hobbies and interests include hunting and  reading.  Substance Use:  No concerns of substance abuse are reported.  The patient reports drinking between 1 or 2 alcoholic beverages per day.  The patient denies any other substance use or tobacco use.  Education:   The patient graduated from high school and has had some college courses.  His best subject in school was science and he had some mild difficulty with [place].  Extracurricular activities included football and wrestling.  Medical History:   Past Medical History:  Diagnosis Date  . Adenomatous colon polyp 2016  . Allergy   . Anxiety   . Bell palsy    resolved  . Chicken pox   . Depression   . Diet-controlled diabetes mellitus ([place])   . Eating disorder   . GERD (gastroesophageal reflux disease)   . History of vitamin D deficiency   . Hyperlipidemia   . Hypertension   . Hypokalemia   . MI (myocardial infarction) ([place])    per pt   . Ulcerative colitis ([place])   . Ulcerative colitis ([place]) 2005-2006   severe              Abuse/Trauma  History: The patient does describe some trauma associated with the death of his father.  His father drowned at age 58 and was diagnosed at the time as having Alzheimer's.  The patient's mother was also diagnosed with Alzheimer's in her 22s.  Psychiatric History:  The patient has a significant history for recurrent major depressive disorder as well as generalized anxiety disorder.  Family Med/Psych History:  Family History  Problem Relation Age of Onset  . Dementia Mother   . Hypertension Mother   . Early death Father 101       drowning   . Post-traumatic stress disorder Brother   . Neuropathy Brother     Risk of Suicide/Violence: low the patient denies any suicidal or homicidal ideation.  Impression/DX:  [name], Sr. is a 68 year old male referred by Dr. Jaynee Eagles, who is his neurologist, for neuropsychological evaluation due to increasing problems with memory, confusion, and depression.  The patient has some indications of  generalized cortical atrophy more pronounced in the temporal lobes as well as chronic microvascular ischemic changes.  There were subtle decreased relative cortical metabolism within the biparietal and temporal lobes compared to frontal lobes found on the recent PET scan as well.  These PET scan findings could be indicative of early Alzheimer's type pathology but the results were not definitive.  The patient is described to be having increasing memory difficulties, confusion, geographic disorientation, frustration, depression and anxiety.   Disposition/Plan:  We have set the patient up for formal neuropsychological testing.    Diagnosis:    Memory loss of unknown cause  Moderate episode of recurrent major depressive disorder ([place])  Generalized anxiety disorder  Obstructive sleep apnea         Electronically Signed   _______________________ Ilean Skill, Psy.D.

## 2018-10-27 NOTE — Telephone Encounter (Signed)
Pt's wife c/a appt on 2/18- he has the flu. She will call back to r/s

## 2018-10-28 ENCOUNTER — Encounter: Payer: Self-pay | Admitting: Family Medicine

## 2018-10-28 ENCOUNTER — Ambulatory Visit: Payer: Medicare HMO | Admitting: Neurology

## 2018-11-07 ENCOUNTER — Encounter: Payer: Medicare HMO | Admitting: Psychology

## 2018-11-10 ENCOUNTER — Encounter: Payer: Self-pay | Admitting: Psychology

## 2018-11-11 DIAGNOSIS — G4733 Obstructive sleep apnea (adult) (pediatric): Secondary | ICD-10-CM | POA: Diagnosis not present

## 2018-11-12 ENCOUNTER — Other Ambulatory Visit: Payer: Self-pay | Admitting: Family Medicine

## 2018-11-12 MED ORDER — QUETIAPINE FUMARATE 100 MG PO TABS: 100.0000 mg | ORAL_TABLET | Freq: Every day | ORAL | 0 refills | Status: DC

## 2018-11-12 NOTE — Telephone Encounter (Signed)
Called pt and appt was scheduled for this Monday. Pt verbalized understanding that #30 with Zero refills of Seroquel would be sent to pharmacy.

## 2018-11-12 NOTE — Telephone Encounter (Signed)
Pt is due for followup on chronic conditions. Received refill for seroquel. We can extend a 30 d script-but he must schedule folllow up

## 2018-11-17 ENCOUNTER — Encounter: Payer: Self-pay | Admitting: Family Medicine

## 2018-11-17 ENCOUNTER — Ambulatory Visit (INDEPENDENT_AMBULATORY_CARE_PROVIDER_SITE_OTHER): Payer: Medicare HMO | Admitting: Family Medicine

## 2018-11-17 VITALS — BP 132/84 | HR 73 | Temp 98.3°F | Resp 16 | Ht 68.0 in | Wt 265.5 lb

## 2018-11-17 DIAGNOSIS — F1320 Sedative, hypnotic or anxiolytic dependence, uncomplicated: Secondary | ICD-10-CM

## 2018-11-17 DIAGNOSIS — E66813 Obesity, class 3: Secondary | ICD-10-CM

## 2018-11-17 DIAGNOSIS — I10 Essential (primary) hypertension: Secondary | ICD-10-CM | POA: Diagnosis not present

## 2018-11-17 DIAGNOSIS — Z79899 Other long term (current) drug therapy: Secondary | ICD-10-CM

## 2018-11-17 DIAGNOSIS — G4733 Obstructive sleep apnea (adult) (pediatric): Secondary | ICD-10-CM | POA: Diagnosis not present

## 2018-11-17 DIAGNOSIS — G4700 Insomnia, unspecified: Secondary | ICD-10-CM

## 2018-11-17 DIAGNOSIS — Z6841 Body Mass Index (BMI) 40.0 and over, adult: Secondary | ICD-10-CM | POA: Diagnosis not present

## 2018-11-17 DIAGNOSIS — F411 Generalized anxiety disorder: Secondary | ICD-10-CM

## 2018-11-17 DIAGNOSIS — F339 Major depressive disorder, recurrent, unspecified: Secondary | ICD-10-CM | POA: Diagnosis not present

## 2018-11-17 MED ORDER — PANTOPRAZOLE SODIUM 20 MG PO TBEC: 20.0000 mg | DELAYED_RELEASE_TABLET | Freq: Every day | ORAL | 3 refills | Status: DC

## 2018-11-17 MED ORDER — FLUOXETINE HCL 20 MG PO CAPS: 60.0000 mg | ORAL_CAPSULE | Freq: Every day | ORAL | 1 refills | Status: DC

## 2018-11-17 MED ORDER — AMLODIPINE BESYLATE 10 MG PO TABS: 10.0000 mg | ORAL_TABLET | Freq: Every day | ORAL | 1 refills | Status: DC

## 2018-11-17 MED ORDER — LORAZEPAM 1 MG PO TABS: 1.0000 mg | ORAL_TABLET | Freq: Three times a day (TID) | ORAL | 5 refills | Status: DC

## 2018-11-17 MED ORDER — QUETIAPINE FUMARATE 100 MG PO TABS: 100.0000 mg | ORAL_TABLET | Freq: Every day | ORAL | 1 refills | Status: DC

## 2018-11-17 NOTE — Patient Instructions (Addendum)
Follow up in 6 months--> will need labs that visit, come fasting.  Wash your legs with Hibiclens (blue bottle) can get at drug store.    Please help [place] help you:  We are honored you have chosen [facility] for your Primary Care home. Below you will find basic instructions that you may need to access in the future. Please help [place] help you by reading the instructions, which cover many of the frequent questions we experience.   Prescription refills and request:  -In order to allow more efficient response time, please call your pharmacy for all refills. They will forward the request electronically to [place]. This allows for the quickest possible response. Request left on a nurse line can take longer to refill, since these are checked as time allows between office patients and other phone calls.  - refill request can take up to 3-5 working days to complete.  - If request is sent electronically and request is appropiate, it is usually completed in 1-2 business days.  - all patients will need to be seen routinely for all chronic medical conditions requiring prescription medications (see follow-up below). If you are overdue for follow up on your condition, you will be asked to make an appointment and we will call in enough medication to cover you until your appointment (up to 30 days).  - all controlled substances will require a face to face visit to request/refill.  - if you desire your prescriptions to go through a new pharmacy, and have an active script at original pharmacy, you will need to call your pharmacy and have scripts transferred to new pharmacy. This is completed between the pharmacy locations and not by your provider.    Results: If any images or labs were ordered, it can take up to 1 week to get results depending on the test ordered and the lab/facility running and resulting the test. - Normal or stable results, which do not need further discussion, may be released to your mychart  immediately with attached note to you. A call may not be generated for normal results. Please make certain to sign up for mychart. If you have questions on how to activate your mychart you can call the front office.  - If your results need further discussion, our office will attempt to contact you via phone, and if unable to reach you after 2 attempts, we will release your abnormal result to your mychart with instructions.  - All results will be automatically released in mychart after 1 week.  - Your provider will provide you with explanation and instruction on all relevant material in your results. Please keep in mind, results and labs may appear confusing or abnormal to the untrained eye, but it does not mean they are actually abnormal for you personally. If you have any questions about your results that are not covered, or you desire more detailed explanation than what was provided, you should make an appointment with your provider to do so.   Our office handles many outgoing and incoming calls daily. If we have not contacted you within 1 week about your results, please check your mychart to see if there is a message first and if not, then contact our office.  In helping with this matter, you help decrease call volume, and therefore allow [place] to be able to respond to patients needs more efficiently.   Acute office visits (sick visit):  An acute visit is intended for a new problem and are scheduled in  shorter time slots to allow schedule openings for patients with new problems. This is the appropriate visit to discuss a new problem. Problems will not be addressed by phone call or Echart message. Appointment is needed if requesting treatment. In order to provide you with excellent quality medical care with proper time for you to explain your problem, have an exam and receive treatment with instructions, these appointments should be limited to one new problem per visit. If you experience a new problem, in  which you desire to be addressed, please make an acute office visit, we save openings on the schedule to accommodate you. Please do not save your new problem for any other type of visit, let us take care of it properly and quickly for you.   Follow up visits:  Depending on your condition(s) your provider will need to see you routinely in order to provide you with quality care and prescribe medication(s). Most chronic conditions (Example: hypertension, Diabetes, depression/anxiety... etc), require visits a couple times a year. Your provider will instruct you on proper follow up for your personal medical conditions and history. Please make certain to make follow up appointments for your condition as instructed. Failing to do so could result in lapse in your medication treatment/refills. If you request a refill, and are overdue to be seen on a condition, we will always provide you with a 30 day script (once) to allow you time to schedule.    Medicare wellness (well visit): - we have a wonderful Nurse Maudie Mercury), that will meet with you and provide you will yearly medicare wellness visits. These visits should occur yearly (can not be scheduled less than 1 calendar year apart) and cover preventive health, immunizations, advance directives and screenings you are entitled to yearly through your medicare benefits. Do not miss out on your entitled benefits, this is when medicare will pay for these benefits to be ordered for you.  These are strongly encouraged by your provider and is the appropriate type of visit to make certain you are up to date with all preventive health benefits. If you have not had your medicare wellness exam in the last 12 months, please make certain to schedule one by calling the office and schedule your medicare wellness with Maudie Mercury as soon as possible.   Yearly physical (well visit):  - Adults are recommended to be seen yearly for physicals. Check with your insurance and date of your last physical,  most insurances require one calendar year between physicals. Physicals include all preventive health topics, screenings, medical exam and labs that are appropriate for gender/age and history. You may have fasting labs needed at this visit. This is a well visit (not a sick visit), new problems should not be covered during this visit (see acute visit).  - Pediatric patients are seen more frequently when they are younger. Your provider will advise you on well child visit timing that is appropriate for your their age. - This is not a medicare wellness visit. Medicare wellness exams do not have an exam portion to the visit. Some medicare companies allow for a physical, some do not allow a yearly physical. If your medicare allows a yearly physical you can schedule the medicare wellness with our nurse Maudie Mercury and have your physical with your provider after, on the same day. Please check with insurance for your full benefits.   Late Policy/No Shows:  - all new patients should arrive 15-30 minutes earlier than appointment to allow [place] time  to  obtain  all personal demographics,  insurance information and for you to complete office paperwork. - All established patients should arrive 10-15 minutes earlier than appointment time to update all information and be checked in .  - In our best efforts to run on time, if you are late for your appointment you will be asked to either reschedule or if able, we will work you back into the schedule. There will be a wait time to work you back in the schedule,  depending on availability.  - If you are unable to make it to your appointment as scheduled, please call 24 hours ahead of time to allow [place] to fill the time slot with someone else who needs to be seen. If you do not cancel your appointment ahead of time, you may be charged a no show fee.

## 2018-11-17 NOTE — Progress Notes (Signed)
Patient ID: [name] Flesher Sr., male  DOB: 09/30/1950, 68 y.o.   MRN: [id number] Patient Care Team    Relationship Specialty Notifications Start End  [name], DO PCP - General Family Medicine  11/08/17   [name], MD Consulting Physician Neurology  11/08/17   Clarene Essex, MD Consulting Physician Gastroenterology  11/29/17     Chief Complaint  Patient presents with  . Follow-up    HTN and Depression, pt is not fasting.     Subjective:  [name] VENTRELLA Sr. is a 68 y.o.  male present for follow up on chronic medical conditions and recent ED visit.  Major depression, recurrent, chronic (HCC)/Benzodiazepine dependence (HCC)/Insomnia, unspecified type/anxiety: Pt reports compliance with medications. He has been taking 100 mg of Seroquel QHS.  Marland Kitchen He still struggles with his CPAP- back to not wearing routinely. He feels he is doing well with depression and anxiety on current regimen.  Prescribed: fluoxetine 60 mg daily.  Lorazepam 1 mg 3 times daily.  Seroquel 100 QHS.  Tried trazodone, which was not effective.   Indication for controlled substance: anxiety Medication and dose: ativan 1 mg TID  # pills per month: 90 Last UDS date: 12/2017 appropiate contract signed (Y/N): Y Date narcotic database last reviewed (include red flags): 11/17/18   Class 3 severe obesity due to excess calories with body mass index (BMI) of 40.0 to 44.9 in adult, unspecified whether serious comorbidity present (HCC)/Essential hypertension Pt reports compliance with amlodipine 10 mg QD. Blood pressures ranges at home not checked. Patient denies chest pain, shortness of breath, dizziness or lower extremity edema.  t takes a daily ASA. Pt is not prescribed statin. BMP: 06/2018 WNL CBC: 10/2019within normal limits Lipid: 07/2017 elevated tg Diet: Does not routinely watch his diet TSH: 3/1/219 NL Exercise: Does not routinely exercise RF: Hypertension, obesity, hyperlipidemia, diet-controlled  diabetes  BMP Latest Ref Rng & Units 07/07/2018 01/03/2018 01/01/2018  Glucose 65 - 99 mg/dL 91 115(H) 156(H)  BUN 7 - 25 mg/dL 7 4(L) 5(L)  Creatinine 0.70 - 1.25 mg/dL 1.04 0.90 1.11  BUN/Creat Ratio 6 - 22 (calc) NOT APPLICABLE - -  Sodium 854 - 146 mmol/L 139 133(L) 132(L)  Potassium 3.5 - 5.3 mmol/L 4.3 4.2 2.8(L)  Chloride 98 - 110 mmol/L 99 93(L) 95(L)  CO2 20 - 32 mmol/L 28 30 22   Calcium 8.6 - 10.3 mg/dL 9.6 9.5 9.4   CBC Latest Ref Rng & Units 07/07/2018 01/01/2018 08/09/2017  WBC 3.8 - 10.8 Thousand/uL 7.6 4.4 4.9  Hemoglobin 13.2 - 17.1 g/dL 13.8 14.0 14.1  Hematocrit 38.5 - 50.0 % 39.0 40.1 41  Platelets 140 - 400 Thousand/uL 191 218 197    Depression screen PHQ 2/9 11/17/2018 05/14/2018 11/29/2017 11/08/2017  Decreased Interest 0 1 - 1  Down, Depressed, Hopeless 1 3 3 2   PHQ - 2 Score 1 4 3 3   Altered sleeping 1 2 2 1   Tired, decreased energy 1 0 2 1  Change in appetite 0 0 2 1  Feeling bad or failure about yourself  1 1 1 1   Trouble concentrating 0 1 1 0  Moving slowly or fidgety/restless 0 0 0 1  Suicidal thoughts 0 0 - 0  PHQ-9 Score 4 8 11 8   Difficult doing work/chores Somewhat difficult Somewhat difficult Very difficult Very difficult   GAD 7 : Generalized Anxiety Score 11/17/2018 11/08/2017  Nervous, Anxious, on Edge 1 2  Control/stop worrying 1 1  Worry too much - different things 1 1  Trouble relaxing 1 2  Restless 1 2  Easily annoyed or irritable 1 2  Afraid - awful might happen 1 1  Total GAD 7 Score 7 11  Anxiety Difficulty Somewhat difficult -       Fall Risk  04/24/2018 04/07/2018 11/08/2017 11/08/2017  Falls in the past year? Yes Yes Yes Yes  Number falls in past yr: 2 or more 2 or more 2 or more 2 or more  Injury with Fall? No No Yes Yes  Comment - - few scratches few scratches  Risk Factor Category  High Fall Risk High Fall Risk - -  Follow up Falls prevention discussed Falls prevention discussed - -   Immunization History  Administered Date(s)  Administered  . Influenza, High Dose Seasonal PF 05/14/2018  . Influenza-Unspecified 09/10/2017  . Pneumococcal Conjugate-13 05/14/2018  . Pneumococcal Polysaccharide-23 06/29/2015  . Tdap 03/17/2015   No exam data present  Past Medical History:  Diagnosis Date  . Adenomatous colon polyp 2016  . Allergy   . Anxiety   . Bell palsy    resolved  . Chicken pox   . Depression   . Diet-controlled diabetes mellitus ([place])   . Eating disorder   . GERD (gastroesophageal reflux disease)   . History of vitamin D deficiency   . Hyperlipidemia   . Hypertension   . Hypokalemia   . Memory loss   . MI (myocardial infarction) ([place])    per pt   . Ulcerative colitis ([place]) 2005-2006   severe    Allergies  Allergen Reactions  . Losartan Potassium-Hctz Diarrhea  . Zoloft [Sertraline Hcl] Diarrhea   Past Surgical History:  Procedure Laterality Date  . COLONOSCOPY WITH PROPOFOL N/A 07/15/2014   Procedure: COLONOSCOPY WITH PROPOFOL;  Surgeon: [name], MD;  Location: [facility];  Service: Endoscopy;  Laterality: N/A;  . COLONOSCOPY WITH PROPOFOL N/A 08/22/2015   Procedure: COLONOSCOPY WITH PROPOFOL;  Surgeon: [name], MD;  Location: [facility];  Service: Endoscopy;  Laterality: N/A;  . ORIF ANKLE FRACTURE  02/27/2012   Procedure: OPEN REDUCTION INTERNAL FIXATION (ORIF) ANKLE FRACTURE;  Surgeon: Colin Rhein, MD;  Location: [facility];  Service: Orthopedics;  Laterality: Left;  ORIF left bimalleolar ankle fracture  . SHOULDER SURGERY     LEFT  . SIGMOIDOSCOPY  10/08/2018   Externa and Internal Hmorrhoids. Tubular and Tublovillous adenoma removed from transverse colon. Inflammatory pseudopolyps, negative for dysplasia  . TONSILLECTOMY    . UPPER GASTROINTESTINAL ENDOSCOPY     Family History  Problem Relation Age of Onset  . Dementia Mother   . Hypertension Mother   . Early death Father 5       drowning   . Post-traumatic stress disorder Brother   .  Neuropathy Brother    Social History   Socioeconomic History  . Marital status: Married    Spouse name: [name]  . Number of children: 2  . Years of education: Not on file  . Highest education level: Some college, no degree  Occupational History  . Not on file  Social Needs  . Financial resource strain: Not on file  . Food insecurity:    Worry: Not on file    Inability: Not on file  . Transportation needs:    Medical: Not on file    Non-medical: Not on file  Tobacco Use  . Smoking status: Former Smoker    Packs/day: 1.00  Years: 19.00    Pack years: 19.00    Last attempt to quit: 02/25/1982    Years since quitting: 36.7  . Smokeless tobacco: Former Systems developer    Quit date: 1990  Substance and Sexual Activity  . Alcohol use: Yes    Comment: occ  . Drug use: No  . Sexual activity: Yes    Partners: Female  Lifestyle  . Physical activity:    Days per week: Not on file    Minutes per session: Not on file  . Stress: Not on file  Relationships  . Social connections:    Talks on phone: Not on file    Gets together: Not on file    Attends religious service: Not on file    Active member of club or organization: Not on file    Attends meetings of clubs or organizations: Not on file    Relationship status: Not on file  . Intimate partner violence:    Fear of current or ex partner: Not on file    Emotionally abused: Not on file    Physically abused: Not on file    Forced sexual activity: Not on file  Other Topics Concern  . Not on file  Social History Narrative   Lives at home with wife [name].   College-educated. Works in Public house manager.    Allergies as of 11/17/2018      Reactions   Losartan Potassium-hctz Diarrhea   Zoloft [sertraline Hcl] Diarrhea      Medication List       Accurate as of November 17, 2018  9:23 AM. Always use your most recent med list.        amLODipine 10 MG tablet Commonly known as:  NORVASC Take 1 tablet (10 mg total) by mouth  daily.   azaTHIOprine 50 MG tablet Commonly known as:  IMURAN Take 200 mg by mouth every morning.   finasteride 5 MG tablet Commonly known as:  PROSCAR Take 5 mg by mouth daily.   FLUoxetine 20 MG capsule Commonly known as:  PROZAC Take 3 capsules (60 mg total) by mouth daily.   fluticasone 50 MCG/ACT nasal spray Commonly known as:  FLONASE Place 2 sprays into both nostrils daily.   levocetirizine 5 MG tablet Commonly known as:  Xyzal Take 0.5 tablets (2.5 mg total) by mouth every evening.   loperamide 2 MG tablet Commonly known as:  Anti-Diarrheal Take 1 tablet (2 mg total) by mouth daily as needed.   LORazepam 1 MG tablet Commonly known as:  ATIVAN Take 1 tablet (1 mg total) by mouth 3 (three) times daily.   multivitamin with minerals Tabs tablet Take 1 tablet by mouth daily.   pantoprazole 20 MG tablet Commonly known as:  PROTONIX Take 1 tablet (20 mg total) by mouth daily.   potassium chloride 10 MEQ tablet Commonly known as:  K-DUR Take 1 tablet (10 mEq total) by mouth daily.   QUEtiapine 100 MG tablet Commonly known as:  SEROQUEL Take 1 tablet (100 mg total) by mouth at bedtime.   VITAMIN B 12 PO Take 1 tablet by mouth daily.       All past medical history, surgical history, allergies, family history, immunizations andmedications were updated in the EMR today and reviewed under the history and medication portions of their EMR.    Recent Results (from the past 2160 hour(s))  HM SIGMOIDOSCOPY     Status: None   Collection Time: 10/08/18 12:00 AM  Result Value  Ref Range   HM Sigmoidoscopy See Report (in chart)     ROS: 14 pt review of systems performed and negative (unless mentioned in an HPI)  Objective: BP 132/84 (BP Location: Left Arm, Patient Position: Sitting, Cuff Size: Large)   Pulse 73   Temp 98.3 F (36.8 C) (Oral)   Resp 16   Ht 5' 8"  (1.727 m)   Wt 265 lb 8 oz (120.4 kg)   SpO2 96%   BMI 40.37 kg/m  Gen: Afebrile. No acute  distress. Nontoxic. Pleasant male. Obese.  HENT: AT. [place].  MMM. Eyes:Pupils Equal Round Reactive to light, Extraocular movements intact,  Conjunctiva without redness, discharge or icterus. Neck/lymp/endocrine: Supple,no lymphadenopathy, no thyromegaly CV: RRR no murmur, no edema, +2/4 P posterior tibialis pulses Chest: CTAB, no wheeze or crackles Abd: Soft. obese. NTND. BS present. no Masses palpated.  Neuro:  Normal gait. PERLA. EOMi. Alert. Oriented x3  Psych: Normal affect, dress and demeanor. Normal speech. Normal thought content and judgment.   No results found for this or any previous visit (from the past 48 hour(s)).  Assessment/plan: [name] Flesher Sr. is a 68 y.o. male present for  major depression/anxiety/insomnia/OSA - stable.  -  Continue Prozac to 60 mg daily, seroqual 100 qhs, ativan TID.  -  Trazodone did not work well for him.  -  Patient is aware of controlled substance policy.  He signed a controlled substance agreement.  Ativan 1 mg 3 times daily scheduled--> no exceptions. NCCS database reviewed 11/17/18  and appropriate.  - UDS: 01/03/2018 appropriate for Benzo use.  - No etoh.  -  referred to Dr. Mamie Levers, uncertain if appt made - F/U 6 months.    Essential hypertension: - BP stable, normal range.   - Continue amlodipine 10 mg QD. Refills provided today. - Low-sodium diet.  Increase exercise. - F/U 6 months  Return in about 6 months (around 05/20/2019) for [facility]- fasting labs also.  Note is dictated utilizing voice recognition software. Although note has been proof read prior to signing, occasional typographical errors still can be missed. If any questions arise, please do not hesitate to call for verification.  Electronically signed by: Howard Pouch, DO [place]

## 2018-11-24 DIAGNOSIS — M1712 Unilateral primary osteoarthritis, left knee: Secondary | ICD-10-CM | POA: Diagnosis not present

## 2018-11-24 DIAGNOSIS — M1711 Unilateral primary osteoarthritis, right knee: Secondary | ICD-10-CM | POA: Diagnosis not present

## 2018-12-04 ENCOUNTER — Ambulatory Visit: Payer: Self-pay | Admitting: Psychology

## 2018-12-09 ENCOUNTER — Ambulatory Visit: Payer: Self-pay | Admitting: Psychology

## 2018-12-16 ENCOUNTER — Other Ambulatory Visit: Payer: Self-pay | Admitting: Family Medicine

## 2018-12-28 ENCOUNTER — Other Ambulatory Visit: Payer: Self-pay | Admitting: Family Medicine

## 2019-02-17 ENCOUNTER — Emergency Department (HOSPITAL_COMMUNITY): Payer: Medicare HMO

## 2019-02-17 ENCOUNTER — Encounter (HOSPITAL_COMMUNITY): Payer: Self-pay | Admitting: Emergency Medicine

## 2019-02-17 ENCOUNTER — Other Ambulatory Visit: Payer: Self-pay

## 2019-02-17 ENCOUNTER — Inpatient Hospital Stay (HOSPITAL_COMMUNITY)
Admission: EM | Admit: 2019-02-17 | Discharge: 2019-02-18 | DRG: 309 | Disposition: A | Discharge status: Home / Self Care | Payer: Medicare HMO | Attending: Internal Medicine | Admitting: Internal Medicine

## 2019-02-17 DIAGNOSIS — I4891 Unspecified atrial fibrillation: Secondary | ICD-10-CM

## 2019-02-17 DIAGNOSIS — Z20828 Contact with and (suspected) exposure to other viral communicable diseases: Secondary | ICD-10-CM | POA: Diagnosis not present

## 2019-02-17 DIAGNOSIS — Z6841 Body Mass Index (BMI) 40.0 and over, adult: Secondary | ICD-10-CM | POA: Diagnosis not present

## 2019-02-17 DIAGNOSIS — R51 Headache: Secondary | ICD-10-CM | POA: Diagnosis not present

## 2019-02-17 DIAGNOSIS — R1030 Lower abdominal pain, unspecified: Secondary | ICD-10-CM | POA: Diagnosis not present

## 2019-02-17 DIAGNOSIS — I10 Essential (primary) hypertension: Secondary | ICD-10-CM | POA: Diagnosis present

## 2019-02-17 DIAGNOSIS — R4182 Altered mental status, unspecified: Secondary | ICD-10-CM | POA: Diagnosis not present

## 2019-02-17 DIAGNOSIS — F411 Generalized anxiety disorder: Secondary | ICD-10-CM | POA: Diagnosis present

## 2019-02-17 DIAGNOSIS — Z87891 Personal history of nicotine dependence: Secondary | ICD-10-CM | POA: Diagnosis not present

## 2019-02-17 DIAGNOSIS — R0602 Shortness of breath: Secondary | ICD-10-CM | POA: Diagnosis not present

## 2019-02-17 DIAGNOSIS — I454 Nonspecific intraventricular block: Secondary | ICD-10-CM | POA: Diagnosis present

## 2019-02-17 DIAGNOSIS — I110 Hypertensive heart disease with heart failure: Secondary | ICD-10-CM | POA: Diagnosis not present

## 2019-02-17 DIAGNOSIS — F1320 Sedative, hypnotic or anxiolytic dependence, uncomplicated: Secondary | ICD-10-CM | POA: Diagnosis not present

## 2019-02-17 DIAGNOSIS — I5030 Unspecified diastolic (congestive) heart failure: Secondary | ICD-10-CM | POA: Diagnosis present

## 2019-02-17 DIAGNOSIS — G4733 Obstructive sleep apnea (adult) (pediatric): Secondary | ICD-10-CM | POA: Diagnosis not present

## 2019-02-17 DIAGNOSIS — R600 Localized edema: Secondary | ICD-10-CM | POA: Diagnosis not present

## 2019-02-17 DIAGNOSIS — E119 Type 2 diabetes mellitus without complications: Secondary | ICD-10-CM | POA: Diagnosis present

## 2019-02-17 DIAGNOSIS — R197 Diarrhea, unspecified: Secondary | ICD-10-CM | POA: Diagnosis not present

## 2019-02-17 DIAGNOSIS — Z79899 Other long term (current) drug therapy: Secondary | ICD-10-CM | POA: Diagnosis not present

## 2019-02-17 DIAGNOSIS — K51919 Ulcerative colitis, unspecified with unspecified complications: Secondary | ICD-10-CM | POA: Diagnosis not present

## 2019-02-17 DIAGNOSIS — I480 Paroxysmal atrial fibrillation: Secondary | ICD-10-CM | POA: Diagnosis not present

## 2019-02-17 DIAGNOSIS — R000 Tachycardia, unspecified: Secondary | ICD-10-CM | POA: Diagnosis not present

## 2019-02-17 DIAGNOSIS — K219 Gastro-esophageal reflux disease without esophagitis: Secondary | ICD-10-CM | POA: Diagnosis present

## 2019-02-17 DIAGNOSIS — I252 Old myocardial infarction: Secondary | ICD-10-CM

## 2019-02-17 DIAGNOSIS — Z888 Allergy status to other drugs, medicaments and biological substances status: Secondary | ICD-10-CM

## 2019-02-17 DIAGNOSIS — Z7951 Long term (current) use of inhaled steroids: Secondary | ICD-10-CM

## 2019-02-17 DIAGNOSIS — E876 Hypokalemia: Secondary | ICD-10-CM | POA: Diagnosis not present

## 2019-02-17 DIAGNOSIS — F0391 Unspecified dementia with behavioral disturbance: Secondary | ICD-10-CM | POA: Diagnosis present

## 2019-02-17 DIAGNOSIS — Z8249 Family history of ischemic heart disease and other diseases of the circulatory system: Secondary | ICD-10-CM

## 2019-02-17 DIAGNOSIS — F0390 Unspecified dementia without behavioral disturbance: Secondary | ICD-10-CM | POA: Diagnosis present

## 2019-02-17 DIAGNOSIS — Z818 Family history of other mental and behavioral disorders: Secondary | ICD-10-CM

## 2019-02-17 DIAGNOSIS — I499 Cardiac arrhythmia, unspecified: Secondary | ICD-10-CM | POA: Diagnosis not present

## 2019-02-17 DIAGNOSIS — F339 Major depressive disorder, recurrent, unspecified: Secondary | ICD-10-CM | POA: Diagnosis not present

## 2019-02-17 DIAGNOSIS — Z9119 Patient's noncompliance with other medical treatment and regimen: Secondary | ICD-10-CM

## 2019-02-17 DIAGNOSIS — R079 Chest pain, unspecified: Secondary | ICD-10-CM | POA: Diagnosis not present

## 2019-02-17 DIAGNOSIS — R42 Dizziness and giddiness: Secondary | ICD-10-CM | POA: Diagnosis not present

## 2019-02-17 DIAGNOSIS — I340 Nonrheumatic mitral (valve) insufficiency: Secondary | ICD-10-CM | POA: Diagnosis not present

## 2019-02-17 LAB — CBC WITH DIFFERENTIAL/PLATELET
Abs Immature Granulocytes: 0.12 10*3/uL — ABNORMAL HIGH (ref 0.00–0.07)
Basophils Absolute: 0.1 10*3/uL (ref 0.0–0.1)
Basophils Relative: 1 %
Eosinophils Absolute: 0 10*3/uL (ref 0.0–0.5)
Eosinophils Relative: 0 %
HCT: 43.2 % (ref 39.0–52.0)
Hemoglobin: 15.4 g/dL (ref 13.0–17.0)
Immature Granulocytes: 2 %
Lymphocytes Relative: 4 %
Lymphs Abs: 0.3 10*3/uL — ABNORMAL LOW (ref 0.7–4.0)
MCH: 35.1 pg — ABNORMAL HIGH (ref 26.0–34.0)
MCHC: 35.6 g/dL (ref 30.0–36.0)
MCV: 98.4 fL (ref 80.0–100.0)
Monocytes Absolute: 0.3 10*3/uL (ref 0.1–1.0)
Monocytes Relative: 4 %
Neutro Abs: 7.4 10*3/uL (ref 1.7–7.7)
Neutrophils Relative %: 89 %
Platelets: 248 10*3/uL (ref 150–400)
RBC: 4.39 MIL/uL (ref 4.22–5.81)
RDW: 14.9 % (ref 11.5–15.5)
WBC: 8.2 10*3/uL (ref 4.0–10.5)
nRBC: 0 % (ref 0.0–0.2)

## 2019-02-17 LAB — GLUCOSE, CAPILLARY: Glucose-Capillary: 150 mg/dL — ABNORMAL HIGH (ref 70–99)

## 2019-02-17 LAB — URINALYSIS, ROUTINE W REFLEX MICROSCOPIC
Bacteria, UA: NONE SEEN
Bilirubin Urine: NEGATIVE
Glucose, UA: NEGATIVE mg/dL
Hgb urine dipstick: NEGATIVE
Ketones, ur: 5 mg/dL — AB
Leukocytes,Ua: NEGATIVE
Nitrite: NEGATIVE
Protein, ur: 30 mg/dL — AB
Specific Gravity, Urine: 1.016 (ref 1.005–1.030)
pH: 8 (ref 5.0–8.0)

## 2019-02-17 LAB — COMPREHENSIVE METABOLIC PANEL
ALT: 22 U/L (ref 0–44)
AST: 47 U/L — ABNORMAL HIGH (ref 15–41)
Albumin: 3.5 g/dL (ref 3.5–5.0)
Alkaline Phosphatase: 81 U/L (ref 38–126)
Anion gap: 17 — ABNORMAL HIGH (ref 5–15)
BUN: <5 mg/dL — ABNORMAL LOW (ref 8–23)
CO2: 24 mmol/L (ref 22–32)
Calcium: 8.8 mg/dL — ABNORMAL LOW (ref 8.9–10.3)
Chloride: 97 mmol/L — ABNORMAL LOW (ref 98–111)
Creatinine, Ser: 1.04 mg/dL (ref 0.61–1.24)
GFR calc Af Amer: >60 mL/min (ref >60)
GFR calc non Af Amer: >60 mL/min (ref >60)
Glucose, Bld: 152 mg/dL — ABNORMAL HIGH (ref 70–99)
Potassium: 3.1 mmol/L — ABNORMAL LOW (ref 3.5–5.1)
Sodium: 138 mmol/L (ref 135–145)
Total Bilirubin: 1.3 mg/dL — ABNORMAL HIGH (ref 0.3–1.2)
Total Protein: 6.1 g/dL — ABNORMAL LOW (ref 6.5–8.1)

## 2019-02-17 LAB — RAPID URINE DRUG SCREEN, HOSP PERFORMED
Amphetamines: NOT DETECTED
Barbiturates: NOT DETECTED
Benzodiazepines: POSITIVE — AB
Cocaine: NOT DETECTED
Opiates: NOT DETECTED
Tetrahydrocannabinol: NOT DETECTED

## 2019-02-17 LAB — BRAIN NATRIURETIC PEPTIDE
B Natriuretic Peptide: 112 pg/mL — ABNORMAL HIGH (ref 0.0–100.0)
B Natriuretic Peptide: 170.5 pg/mL — ABNORMAL HIGH (ref 0.0–100.0)

## 2019-02-17 LAB — HEMOGLOBIN A1C
Hgb A1c MFr Bld: 5.8 % — ABNORMAL HIGH (ref 4.8–5.6)
Mean Plasma Glucose: 119.76 mg/dL

## 2019-02-17 LAB — TSH: TSH: 8.627 u[IU]/mL — ABNORMAL HIGH (ref 0.350–4.500)

## 2019-02-17 LAB — I-STAT TROPONIN, ED: Troponin i, poc: 0.02 ng/mL (ref 0.00–0.08)

## 2019-02-17 LAB — LIPASE, BLOOD: Lipase: 23 U/L (ref 11–51)

## 2019-02-17 LAB — ETHANOL: Alcohol, Ethyl (B): <10 mg/dL (ref <10)

## 2019-02-17 LAB — SARS CORONAVIRUS 2: SARS Coronavirus 2: NOT DETECTED

## 2019-02-17 LAB — TROPONIN I: Troponin I: <0.03 ng/mL (ref <0.03)

## 2019-02-17 MED ORDER — POTASSIUM CHLORIDE 10 MEQ/100ML IV SOLN
10.0000 meq | Freq: Once | INTRAVENOUS | Status: AC
  Administered 2019-02-17: 10 meq via INTRAVENOUS
  Filled 2019-02-17: qty 100

## 2019-02-17 MED ORDER — DICYCLOMINE HCL 20 MG PO TABS
20.0000 mg | ORAL_TABLET | Freq: Three times a day (TID) | ORAL | Status: DC
  Administered 2019-02-17 – 2019-02-18 (×3): 20 mg via ORAL
  Filled 2019-02-17 (×5): qty 1

## 2019-02-17 MED ORDER — IOHEXOL 300 MG/ML  SOLN
100.0000 mL | Freq: Once | INTRAMUSCULAR | Status: AC | PRN
  Administered 2019-02-17: 100 mL via INTRAVENOUS

## 2019-02-17 MED ORDER — FLUOXETINE HCL 20 MG PO CAPS
20.0000 mg | ORAL_CAPSULE | Freq: Three times a day (TID) | ORAL | Status: DC
  Administered 2019-02-17 – 2019-02-18 (×2): 20 mg via ORAL
  Filled 2019-02-17 (×3): qty 1

## 2019-02-17 MED ORDER — ENOXAPARIN SODIUM 120 MG/0.8ML ~~LOC~~ SOLN
110.0000 mg | Freq: Two times a day (BID) | SUBCUTANEOUS | Status: DC
  Filled 2019-02-17 (×2): qty 0.73

## 2019-02-17 MED ORDER — METOPROLOL TARTRATE 25 MG PO TABS: 25.0000 mg | ORAL_TABLET | Freq: Two times a day (BID) | ORAL | Status: DC

## 2019-02-17 MED ORDER — ADULT MULTIVITAMIN W/MINERALS CH
1.0000 | ORAL_TABLET | Freq: Every day | ORAL | Status: DC
  Administered 2019-02-18: 1 via ORAL
  Filled 2019-02-17: qty 1

## 2019-02-17 MED ORDER — FINASTERIDE 5 MG PO TABS
5.0000 mg | ORAL_TABLET | Freq: Every day | ORAL | Status: DC
  Administered 2019-02-17 – 2019-02-18 (×2): 5 mg via ORAL
  Filled 2019-02-17 (×2): qty 1

## 2019-02-17 MED ORDER — METOCLOPRAMIDE HCL 5 MG/ML IJ SOLN
10.0000 mg | Freq: Once | INTRAMUSCULAR | Status: AC
  Administered 2019-02-17: 10 mg via INTRAVENOUS
  Filled 2019-02-17: qty 2

## 2019-02-17 MED ORDER — APIXABAN 5 MG PO TABS
5.0000 mg | ORAL_TABLET | Freq: Two times a day (BID) | ORAL | Status: DC
  Administered 2019-02-17 – 2019-02-18 (×2): 5 mg via ORAL
  Filled 2019-02-17 (×2): qty 1

## 2019-02-17 MED ORDER — POTASSIUM CHLORIDE CRYS ER 10 MEQ PO TBCR: 10.0000 meq | EXTENDED_RELEASE_TABLET | Freq: Every day | ORAL | Status: DC

## 2019-02-17 MED ORDER — ONDANSETRON HCL 4 MG/2ML IJ SOLN: 4.0000 mg | Freq: Four times a day (QID) | INTRAMUSCULAR | Status: DC | PRN

## 2019-02-17 MED ORDER — SODIUM CHLORIDE 0.9 % IV BOLUS
500.0000 mL | Freq: Once | INTRAVENOUS | Status: AC
  Administered 2019-02-17: 500 mL via INTRAVENOUS

## 2019-02-17 MED ORDER — QUETIAPINE FUMARATE 100 MG PO TABS
100.0000 mg | ORAL_TABLET | Freq: Every day | ORAL | Status: DC
  Administered 2019-02-17: 100 mg via ORAL
  Filled 2019-02-17: qty 1

## 2019-02-17 MED ORDER — LORAZEPAM 1 MG PO TABS
1.0000 mg | ORAL_TABLET | Freq: Three times a day (TID) | ORAL | Status: DC
  Administered 2019-02-17 – 2019-02-18 (×2): 1 mg via ORAL
  Filled 2019-02-17 (×2): qty 1

## 2019-02-17 MED ORDER — INSULIN ASPART 100 UNIT/ML ~~LOC~~ SOLN: 0.0000 [IU] | Freq: Three times a day (TID) | SUBCUTANEOUS | Status: DC

## 2019-02-17 MED ORDER — PANTOPRAZOLE SODIUM 20 MG PO TBEC
20.0000 mg | DELAYED_RELEASE_TABLET | Freq: Every day | ORAL | Status: DC
  Administered 2019-02-18: 20 mg via ORAL
  Filled 2019-02-17: qty 1

## 2019-02-17 MED ORDER — METOPROLOL TARTRATE 25 MG PO TABS
25.0000 mg | ORAL_TABLET | Freq: Two times a day (BID) | ORAL | Status: DC
  Administered 2019-02-17 – 2019-02-18 (×2): 25 mg via ORAL
  Filled 2019-02-17 (×2): qty 1

## 2019-02-17 MED ORDER — ACETAMINOPHEN 325 MG PO TABS: 650.0000 mg | ORAL_TABLET | ORAL | Status: DC | PRN

## 2019-02-17 MED ORDER — AMLODIPINE BESYLATE 10 MG PO TABS: 10.0000 mg | ORAL_TABLET | Freq: Every day | ORAL | Status: DC

## 2019-02-17 MED ORDER — AZATHIOPRINE 50 MG PO TABS
200.0000 mg | ORAL_TABLET | ORAL | Status: DC
  Administered 2019-02-18: 200 mg via ORAL
  Filled 2019-02-17: qty 4

## 2019-02-17 MED ORDER — LEVOCETIRIZINE DIHYDROCHLORIDE 5 MG PO TABS: 2.5000 mg | ORAL_TABLET | Freq: Every evening | ORAL | Status: DC

## 2019-02-17 MED ORDER — LOPERAMIDE HCL 2 MG PO CAPS: 2.0000 mg | ORAL_CAPSULE | Freq: Every day | ORAL | Status: DC | PRN

## 2019-02-17 MED ORDER — LORATADINE 10 MG PO TABS
10.0000 mg | ORAL_TABLET | Freq: Every day | ORAL | Status: DC
  Administered 2019-02-17 – 2019-02-18 (×2): 10 mg via ORAL
  Filled 2019-02-17 (×2): qty 1

## 2019-02-17 NOTE — Progress Notes (Signed)
Pt stated he had not been wearing home machine due to mask not fitting correctly and does not like using machine. RT asked if pt would like to use hospital cpap and pt refuse. RT informed pt to call for RT if he changes his mind during the night.

## 2019-02-17 NOTE — Progress Notes (Signed)
ANTICOAGULATION CONSULT NOTE   Pharmacy Consult for enoxaparin > Eliquis Indication: atrial fibrillation  Allergies  Allergen Reactions  . Losartan Potassium-Hctz Diarrhea  . Zoloft [Sertraline Hcl] Diarrhea    Patient Measurements: Height: 5' 8"  (172.7 cm) Weight: 250 lb (113.4 kg) IBW/kg (Calculated) : 68.4 HEPARIN DW (KG): 89.8  Vital Signs: Temp: 98.3 F (36.8 C) (06/09 1908) Temp Source: Oral (06/09 1908) BP: 156/87 (06/09 1908) Pulse Rate: 70 (06/09 1730)  Labs: Recent Labs    02/17/19 1252  HGB 15.4  HCT 43.2  PLT 248  CREATININE 1.04    Estimated Creatinine Clearance: 81.9 mL/min (by C-G formula based on SCr of 1.04 mg/dL).   Medical History: Past Medical History:  Diagnosis Date  . Adenomatous colon polyp 2016  . Allergy   . Anxiety   . Bell palsy    resolved  . Chicken pox   . Depression   . Diet-controlled diabetes mellitus ([place])   . Eating disorder   . GERD (gastroesophageal reflux disease)   . History of vitamin D deficiency   . Hyperlipidemia   . Hypertension   . Hypokalemia   . Memory loss   . MI (myocardial infarction) ([place])    per pt   . Ulcerative colitis ([place]) 2005-2006   severe     Assessment: [name] Bienenstock Sr. Is a 68yo male admitted with a chief complaint of dizziness and weakness. He was found to be in new-onset afib. Pharmacy has been consulted to start enoxaparin for afib. CBC stable with Hgb 15.4 and pltc 248. Scr 1.04 with CrCl 79.84m/min. Body weight this admit 113kg.  Initially ordered for lovenox, but dose not yet given.  Pharmacy asked to start Eliquis. Weight > 60 kg, age < 89and Scr WNL.  Goal of Therapy:  Monitor platelets by anticoagulation protocol: Yes   Plan:  Eliquis 5 mg BID Will need education prior to discharge.  JMarguerite Olea BVa Puget [facility] Care System SeattleClinical Pharmacist Phone ([phone] 02/17/2019 7:16 PM

## 2019-02-17 NOTE — ED Provider Notes (Signed)
[facility] EMERGENCY DEPARTMENT Provider Note   CSN: [id number] Arrival date & time: 02/17/19  1228    History   Chief Complaint Chief Complaint  Patient presents with   Dizziness    HPI [name] LUKES Sr. is a 68 y.o. male.     68 y.o male with a PMH of GERD, HTN, MI presents to the ED via EMS with a chief complaint of dizziness. Patient reports he awoke this morning around 2:30 am and began to have several bowel movements which were loose. He also reports upon waking up this morning he felt weak, he reports striking his head against the wall, states this was minimal.  Patient also reports he has had more urinary frequency since last week.  Patient also reports he had an echocardiogram on the 12th of last month.  He reports his mother has a previous history of CAD, he states he has been following up closely with his physician.  Patient reports he takes "about 27 medications on a daily basis ". According to patient's record on his visit on 09/2018 he has been losing his memory more than often. Unable to obtain further information from patient.    The history is provided by the patient and medical records.    Past Medical History:  Diagnosis Date   Adenomatous colon polyp 2016   Allergy    Anxiety    Bell palsy    resolved   Chicken pox    Depression    Diet-controlled diabetes mellitus ([place])    Eating disorder    GERD (gastroesophageal reflux disease)    History of vitamin D deficiency    Hyperlipidemia    Hypertension    Hypokalemia    Memory loss    MI (myocardial infarction) ([place])    per pt    Ulcerative colitis ([place]) 2005-2006   severe     Patient Active Problem List   Diagnosis Date Noted   Concussion with no loss of consciousness 07/07/2018   GAD (generalized anxiety disorder) 05/14/2018   Benzodiazepine dependence ([place]) 11/29/2017   Benzodiazepine contract exists 11/29/2017   Essential hypertension 11/29/2017    Insomnia 11/29/2017   Ulcerative colitis with complication ([place]) 75/91/6384   Moderate obstructive sleep apnea 11/08/2017   Class 3 severe obesity due to excess calories with body mass index (BMI) of 40.0 to 44.9 in adult [facility]) 11/08/2017   Major depression, recurrent, chronic ([place]) 09/18/2017    Past Surgical History:  Procedure Laterality Date   COLONOSCOPY WITH PROPOFOL N/A 07/15/2014   Procedure: COLONOSCOPY WITH PROPOFOL;  Surgeon: [name], MD;  Location: [facility];  Service: Endoscopy;  Laterality: N/A;   COLONOSCOPY WITH PROPOFOL N/A 08/22/2015   Procedure: COLONOSCOPY WITH PROPOFOL;  Surgeon: [name], MD;  Location: [facility];  Service: Endoscopy;  Laterality: N/A;   ORIF ANKLE FRACTURE  02/27/2012   Procedure: OPEN REDUCTION INTERNAL FIXATION (ORIF) ANKLE FRACTURE;  Surgeon: Colin Rhein, MD;  Location: [facility];  Service: Orthopedics;  Laterality: Left;  ORIF left bimalleolar ankle fracture   SHOULDER SURGERY     LEFT   SIGMOIDOSCOPY  10/08/2018   Externa and Internal Hmorrhoids. Tubular and Tublovillous adenoma removed from transverse colon. Inflammatory pseudopolyps, negative for dysplasia   TONSILLECTOMY     UPPER GASTROINTESTINAL ENDOSCOPY          Home Medications    Prior to Admission medications   Medication Sig Start Date End Date Taking? Authorizing Provider  amLODipine (NORVASC) 10 MG tablet Take 1 tablet (10 mg total) by mouth daily. 11/17/18   [name], [name], DO  azaTHIOprine (IMURAN) 50 MG tablet Take 200 mg by mouth every morning.     [provider]  Cyanocobalamin (VITAMIN B 12 PO) Take 1 tablet by mouth daily.    [provider]  finasteride (PROSCAR) 5 MG tablet Take 5 mg by mouth daily.    [provider]  FLUoxetine (PROZAC) 20 MG capsule Take 3 capsules (60 mg total) by mouth daily. 11/17/18   [name], [name], DO  fluticasone (FLONASE) 50 MCG/ACT nasal spray Place 2 sprays  into both nostrils daily. Patient taking differently: Place 2 sprays into both nostrils daily as needed for allergies or rhinitis.  12/18/17   [name], [name], DO  KLOR-CON M10 10 MEQ tablet TAKE 1 TABLET BY MOUTH EVERY DAY 12/29/18   [name], [name], DO  levocetirizine (XYZAL) 5 MG tablet TAKE 1/2 TABLET BY MOUTH EVERY EVENING 12/16/18   [name], [name], DO  loperamide (ANTI-DIARRHEAL) 2 MG tablet Take 1 tablet (2 mg total) by mouth daily as needed. Patient taking differently: Take 2 mg by mouth daily as needed for diarrhea or loose stools.  11/29/17   [name], [name], DO  LORazepam (ATIVAN) 1 MG tablet Take 1 tablet (1 mg total) by mouth 3 (three) times daily. 11/17/18   [name], [name], DO  Multiple Vitamin (MULTIVITAMIN WITH MINERALS) TABS tablet Take 1 tablet by mouth daily.    [provider]  pantoprazole (PROTONIX) 20 MG tablet Take 1 tablet (20 mg total) by mouth daily. 11/17/18   [name], [name], DO  potassium chloride (K-DUR) 10 MEQ tablet Take 1 tablet (10 mEq total) by mouth daily. 01/06/18   [name], [name], DO  QUEtiapine (SEROQUEL) 100 MG tablet Take 1 tablet (100 mg total) by mouth at bedtime. 11/17/18   [name], DO    Family History Family History  Problem Relation Age of Onset   Dementia Mother    Hypertension Mother    Early death Father 4       drowning    Post-traumatic stress disorder Brother    Neuropathy Brother     Social History Social History   Tobacco Use   Smoking status: Former Smoker    Packs/day: 1.00    Years: 19.00    Pack years: 19.00    Last attempt to quit: 02/25/1982    Years since quitting: 37.0   Smokeless tobacco: Former Systems developer    Quit date: 1990  Substance Use Topics   Alcohol use: Yes    Comment: occ   Drug use: No     Allergies   Losartan potassium-hctz and Zoloft [sertraline hcl]   Review of Systems Review of Systems  Constitutional: Negative for chills and fever.  HENT: Negative for ear pain and sore throat.    Eyes: Negative for pain and visual disturbance.  Respiratory: Negative for cough and shortness of breath.   Cardiovascular: Negative for chest pain and palpitations.  Gastrointestinal: Positive for abdominal pain. Negative for vomiting.  Genitourinary: Positive for frequency. Negative for dysuria and hematuria.  Musculoskeletal: Negative for arthralgias and back pain.  Skin: Negative for color change and rash.  Neurological: Negative for seizures and syncope.  All other systems reviewed and are negative.    Physical Exam Updated Vital Signs BP 127/81    Pulse (!) 108    Temp 98.4 F (36.9 C) (Oral)  Resp (!) 21    Ht 5' 6"  (1.676 m)    Wt 113.4 kg    SpO2 96%    BMI 40.35 kg/m   Physical Exam Vitals signs and nursing note reviewed.  Constitutional:      Appearance: He is well-developed.  HENT:     Head: Normocephalic and atraumatic.  Eyes:     General: No scleral icterus.    Pupils: Pupils are equal, round, and reactive to light.  Neck:     Musculoskeletal: Normal range of motion.  Cardiovascular:     Rate and Rhythm: Tachycardia present. Rhythm irregular.     Heart sounds: Normal heart sounds.  Pulmonary:     Effort: Pulmonary effort is normal.     Breath sounds: Normal breath sounds. No wheezing.  Chest:     Chest wall: No tenderness.  Abdominal:     General: Bowel sounds are normal. There is no distension.     Palpations: Abdomen is soft.     Tenderness: There is no abdominal tenderness.  Musculoskeletal:        General: No tenderness or deformity.  Skin:    General: Skin is warm and dry.  Neurological:     Mental Status: He is alert and oriented to person, place, and time.      ED Treatments / Results  Labs (all labs ordered are listed, but only abnormal results are displayed) Labs Reviewed  CBC WITH DIFFERENTIAL/PLATELET - Abnormal; Notable for the following components:      Result Value   MCH 35.1 (*)    Lymphs Abs 0.3 (*)    Abs Immature  Granulocytes 0.12 (*)    All other components within normal limits  COMPREHENSIVE METABOLIC PANEL - Abnormal; Notable for the following components:   Potassium 3.1 (*)    Chloride 97 (*)    Glucose, Bld 152 (*)    BUN <5 (*)    Calcium 8.8 (*)    Total Protein 6.1 (*)    AST 47 (*)    Total Bilirubin 1.3 (*)    Anion gap 17 (*)    All other components within normal limits  URINALYSIS, ROUTINE W REFLEX MICROSCOPIC - Abnormal; Notable for the following components:   Ketones, ur 5 (*)    Protein, ur 30 (*)    All other components within normal limits  SARS CORONAVIRUS 2 (HOSPITAL ORDER, [place] [facility])  LIPASE, BLOOD  RAPID URINE DRUG SCREEN, HOSP PERFORMED  ETHANOL  BRAIN NATRIURETIC PEPTIDE  I-STAT TROPONIN, ED    EKG EKG Interpretation  Date/Time:  Tuesday February 17 2019 12:43:30 EDT Ventricular Rate:  114 PR Interval:    QRS Duration: 121 QT Interval:  393 QTC Calculation: 542 R Axis:   -68 Text Interpretation:  Atrial fibrillation Nonspecific IVCD with LAD Probable anteroseptal infarct, old Confirmed by Sherwood Gambler [phone]) on 02/17/2019 12:45:07 PM   Radiology Ct Head Wo Contrast  Result Date: 02/17/2019 CLINICAL DATA:  Pain following fall EXAM: CT HEAD WITHOUT CONTRAST TECHNIQUE: Contiguous axial images were obtained from the base of the skull through the vertex without intravenous contrast. COMPARISON:  July 07, 2018 FINDINGS: Brain: There is mild diffuse atrophy. There is no intracranial mass, hemorrhage, extra-axial fluid collection, or midline shift. Brain parenchyma appears unremarkable. There is no demonstrable acute infarct. Vascular: There is no appreciable hyperdense vessel. There is calcification in each carotid siphon region. Skull: Bony calvarium appears intact. Sinuses/Orbits: There is mucosal thickening in several  ethmoid air cells. Other visualized paranasal sinuses are clear. Orbits appear symmetric bilaterally. Other: Mastoid air  cells are clear. IMPRESSION: Mild diffuse atrophy. Brain parenchyma appears unremarkable. No mass or hemorrhage. There are foci of carotid artery calcification. There is mucosal thickening in several ethmoid air cells. Electronically Signed   By: Lowella Grip III M.D.   On: 02/17/2019 14:47   Ct Abdomen Pelvis W Contrast  Result Date: 02/17/2019 CLINICAL DATA:  Left groin pain, nausea, diarrhea. EXAM: CT ABDOMEN AND PELVIS WITH CONTRAST TECHNIQUE: Multidetector CT imaging of the abdomen and pelvis was performed using the standard protocol following bolus administration of intravenous contrast. CONTRAST:  175m OMNIPAQUE IOHEXOL 300 MG/ML  SOLN COMPARISON:  None. FINDINGS: Lower chest: No acute abnormality. Hepatobiliary: No focal liver abnormality is seen. No gallstones, gallbladder wall thickening, or biliary dilatation. Pancreas: Unremarkable. No pancreatic ductal dilatation or surrounding inflammatory changes. Spleen: Normal in size without focal abnormality. Adrenals/Urinary Tract: Adrenal glands are unremarkable. Kidneys are normal, without renal calculi, focal lesion, or hydronephrosis. Bladder is unremarkable. Stomach/Bowel: Stomach is within normal limits. Appendix appears normal. No evidence of bowel wall thickening, distention, or inflammatory changes. Vascular/Lymphatic: Aortic atherosclerosis. No enlarged abdominal or pelvic lymph nodes. Reproductive: Prostate is unremarkable. Other: No abdominal wall hernia or abnormality. No abdominopelvic ascites. Musculoskeletal: No acute or significant osseous findings. IMPRESSION: No acute abnormality seen in the abdomen or pelvis. Aortic Atherosclerosis (ICD10-I70.0). Electronically Signed   By: JMarijo ConceptionM.D.   On: 02/17/2019 14:54   Dg Chest Portable 1 View  Result Date: 02/17/2019 CLINICAL DATA:  Left lower chest pain since last night with shortness of breath EXAM: PORTABLE CHEST 1 VIEW COMPARISON:  01/01/2018 FINDINGS: Bilateral mild  interstitial thickening. No pleural effusion or pneumothorax. Stable cardiomegaly. No aggressive osseous lesion. IMPRESSION: Cardiomegaly with mild pulmonary vascular congestion. Electronically Signed   By: HKathreen Devoid  On: 02/17/2019 13:08    Procedures Procedures (including critical care time)  Medications Ordered in ED Medications  potassium chloride 10 mEq in 100 mL IVPB (10 mEq Intravenous New Bag/Given 02/17/19 1512)  sodium chloride 0.9 % bolus 500 mL (0 mLs Intravenous Stopped 02/17/19 1450)  metoCLOPramide (REGLAN) injection 10 mg (10 mg Intravenous Given 02/17/19 1404)  iohexol (OMNIPAQUE) 300 MG/ML solution 100 mL (100 mLs Intravenous Contrast Given 02/17/19 1445)     Initial Impression / Assessment and Plan / ED Course  I have reviewed the triage vital signs and the nursing notes.  Pertinent labs & imaging results that were available during my care of the patient were reviewed by me and considered in my medical decision making (see chart for details).    Patient with an extensive past medical history presents to the ED via EMS with complaints of dizziness, lower abdominal pain along with diarrhea that began this morning around 2 AM.  Patient also reports he had multiple episodes of emesis.  He arrived in the ED with an irregularly irregular rhythm, no known history of A. fib prior to arrival.  I have reviewed his chart at length, patient did have a visit in January which reported he had some changes in memory. CBC showed no leukocytosis, hemoglobin is within normal limits.  CMP showed hypokalemia, will replace this via IV.  AST is slightly elevated at 47.  Lipase is within normal limits.  First troponin was negative.DG chest xray showed    2:50 PM Spoke to SBonsallwife at [phone] issues, reports he has been very depressed on medication  for depression. She reports he woke up this morning and he was fine, then called her and told her she was going to pass out. Wife states  he has not been eating, his appetite has been decreased.  Patient does have a history of ulcerative colitis at baseline, wife reports he normally has diarrhea regularly, he has been weaker than usual.  CT Abdomen showed no acute process. CT Head showed: Mild diffuse atrophy. Brain parenchyma appears unremarkable. No mass  or hemorrhage.    There are foci of carotid artery calcification. There is mucosal  thickening in several ethmoid air cells.     At this time with patients complaints of weakness, AMS will place call for hospitalist admission.  3:39 PM Spoke to hospitalist Dr. Mariea Clonts who will evaluated and admit patient for further management of his increased confusion.   Final Clinical Impressions(s) / ED Diagnoses   Final diagnoses:  New onset a-fib ([place])  Altered mental status, unspecified altered mental status type  Hypokalemia    ED Discharge Orders    None       Janeece Fitting, PA-C 02/17/19 1539    Sherwood Gambler, MD 02/19/19 [phone]

## 2019-02-17 NOTE — ED Triage Notes (Signed)
Pt arrives via EMS from home with reports of not feeling well since 3am. Pt reports ulcerative colitis with some diarrhea. 500 cc NS, 4 mg zofran given. EMS reports A-fib and pt denies hx of.

## 2019-02-17 NOTE — Consult Note (Addendum)
Cardiology Consultation:   Patient ID: [name] MRN: [id number]; DOB: [birth year]  Admit date: 02/17/2019 Date of Consult: 02/17/2019  Primary Care Provider: [name], DO Primary Cardiologist: Shelva Majestic, MD new Primary Electrophysiologist:  None    Patient Profile:   [name] is a 68 y.o. male with a hx of anxiety, depression, GERD, obesity, OSA, DM, HLD, HTN, benzodiazepine dependence, memory loss, and UC who is being seen today for the evaluation of Afib at the request of Dr. Evangeline Gula.  History of Present Illness:   Mr. Xu presented to [facility] with altered mental status. He had several BMs this morning at 0230, not unusual due to his UC. He felt weak and hit his head on the wall. On presentation, he was found to be in Atrial fibrillation with RVR.  Cardiology was consulted.  On my interview, patient may have had food poisoning from a sandwich last evening. He awoke at [facility] with many episodes of vomiting. He called the fire department at [place] to check his pressure. Pressure was elevated to 159/94 and EMS was called. EMS reportedly found him to be in Afib and transported him to [facility]. EKG on arrival with coarse Afib, HR 114.   He denies chest pain, palpitations, and heart racing. He denies syncope. He does report some new shortness of breath that started in Jan of this year. His lower extremity swelling is normal for him. He is not compliant with his CPAP at home due to mask being uncomfortable. He denies prior cardiac issues. He denies prior MI. Overall, he is tolerating the rhythm well.   Past Medical History:  Diagnosis Date   Adenomatous colon polyp 2016   Allergy    Anxiety    Bell palsy    resolved   Chicken pox    Depression    Diet-controlled diabetes mellitus ([place])    Eating disorder    GERD (gastroesophageal reflux disease)    History of vitamin D deficiency    Hyperlipidemia    Hypertension    Hypokalemia    Memory loss    MI  (myocardial infarction) ([place])    per pt    Ulcerative colitis ([place]) 2005-2006   severe     Past Surgical History:  Procedure Laterality Date   COLONOSCOPY WITH PROPOFOL N/A 07/15/2014   Procedure: COLONOSCOPY WITH PROPOFOL;  Surgeon: [name], MD;  Location: [facility];  Service: Endoscopy;  Laterality: N/A;   COLONOSCOPY WITH PROPOFOL N/A 08/22/2015   Procedure: COLONOSCOPY WITH PROPOFOL;  Surgeon: [name], MD;  Location: [facility];  Service: Endoscopy;  Laterality: N/A;   ORIF ANKLE FRACTURE  02/27/2012   Procedure: OPEN REDUCTION INTERNAL FIXATION (ORIF) ANKLE FRACTURE;  Surgeon: Colin Rhein, MD;  Location: [place];  Service: Orthopedics;  Laterality: Left;  ORIF left bimalleolar ankle fracture   SHOULDER SURGERY     LEFT   SIGMOIDOSCOPY  10/08/2018   Externa and Internal Hmorrhoids. Tubular and Tublovillous adenoma removed from transverse colon. Inflammatory pseudopolyps, negative for dysplasia   TONSILLECTOMY     UPPER GASTROINTESTINAL ENDOSCOPY       Home Medications:  Prior to Admission medications   Medication Sig Start Date End Date Taking? Authorizing Provider  amLODipine (NORVASC) 10 MG tablet Take 1 tablet (10 mg total) by mouth daily. 11/17/18  Yes [name], [name], DO  azaTHIOprine (IMURAN) 50 MG tablet Take 200 mg by mouth every morning.    Yes [provider]  dicyclomine (BENTYL) 20 MG tablet Take 20 mg by mouth 4 (four) times daily -  before meals and at bedtime. 01/04/19  Yes [provider]  finasteride (PROSCAR) 5 MG tablet Take 5 mg by mouth daily.   Yes [provider]  FLUoxetine (PROZAC) 20 MG capsule Take 3 capsules (60 mg total) by mouth daily. Patient taking differently: Take 20 mg by mouth 3 (three) times daily.  11/17/18  Yes [name], [name], DO  fluticasone (FLONASE) 50 MCG/ACT nasal spray Place 2 sprays into both nostrils daily. Patient taking differently: Place 2 sprays into both  nostrils daily as needed for allergies or rhinitis.  12/18/17  Yes [name], [name], DO  KLOR-CON M10 10 MEQ tablet TAKE 1 TABLET BY MOUTH EVERY DAY Patient taking differently: Take 10 mEq by mouth daily.  12/29/18  Yes [name], [name], DO  levocetirizine (XYZAL) 5 MG tablet TAKE 1/2 TABLET BY MOUTH EVERY EVENING 12/16/18  Yes [name], [name], DO  loperamide (ANTI-DIARRHEAL) 2 MG tablet Take 1 tablet (2 mg total) by mouth daily as needed. Patient taking differently: Take 2 mg by mouth daily as needed for diarrhea or loose stools.  11/29/17  Yes [name], [name], DO  LORazepam (ATIVAN) 1 MG tablet Take 1 tablet (1 mg total) by mouth 3 (three) times daily. 11/17/18  Yes [name], [name], DO  Multiple Vitamin (MULTIVITAMIN WITH MINERALS) TABS tablet Take 1 tablet by mouth daily.   Yes [provider]  pantoprazole (PROTONIX) 20 MG tablet Take 1 tablet (20 mg total) by mouth daily. 11/17/18  Yes [name], [name], DO  QUEtiapine (SEROQUEL) 100 MG tablet Take 1 tablet (100 mg total) by mouth at bedtime. 11/17/18  Yes [name], [name], DO    Inpatient Medications: Scheduled Meds:  amLODipine  10 mg Oral Daily   [START ON 02/18/2019] azaTHIOprine  200 mg Oral BH-[id number]   dicyclomine  20 mg Oral TID AC & HS   enoxaparin (LOVENOX) injection  110 mg Subcutaneous Q12H   finasteride  5 mg Oral Daily   FLUoxetine  20 mg Oral TID   insulin aspart  0-15 Units Subcutaneous TID WC   levocetirizine  2.5 mg Oral QPM   LORazepam  1 mg Oral TID   metoprolol tartrate  25 mg Oral BID   multivitamin with minerals  1 tablet Oral Daily   pantoprazole  20 mg Oral Daily   potassium chloride  10 mEq Oral Daily   QUEtiapine  100 mg Oral QHS   Continuous Infusions:  PRN Meds: acetaminophen, loperamide, ondansetron (ZOFRAN) IV  Allergies:    Allergies  Allergen Reactions   Losartan Potassium-Hctz Diarrhea   Zoloft [Sertraline Hcl] Diarrhea    Social History:   Social History   Socioeconomic History     Marital status: Married    Spouse name: [name]   Number of children: 2   Years of education: Not on file   Highest education level: Some college, no degree  Occupational History   Not on file  Social Needs   Financial resource strain: Not on file   Food insecurity:    Worry: Not on file    Inability: Not on file   Transportation needs:    Medical: Not on file    Non-medical: Not on file  Tobacco Use   Smoking status: Former Smoker    Packs/day: 1.00    Years: 19.00    Pack years: 19.00    Last attempt  to quit: 02/25/1982    Years since quitting: 37.0   Smokeless tobacco: Former Systems developer    Quit date: 1990  Substance and Sexual Activity   Alcohol use: Yes    Comment: occ   Drug use: No   Sexual activity: Yes    Partners: Female  Lifestyle   Physical activity:    Days per week: Not on file    Minutes per session: Not on file   Stress: Not on file  Relationships   Social connections:    Talks on phone: Not on file    Gets together: Not on file    Attends religious service: Not on file    Active member of club or organization: Not on file    Attends meetings of clubs or organizations: Not on file    Relationship status: Not on file   Intimate partner violence:    Fear of current or ex partner: Not on file    Emotionally abused: Not on file    Physically abused: Not on file    Forced sexual activity: Not on file  Other Topics Concern   Not on file  Social History Narrative   Lives at home with wife [name].   College-educated. Works in Public house manager.     Family History:    Family History  Problem Relation Age of Onset   Dementia Mother    Hypertension Mother    Early death Father 33       drowning    Post-traumatic stress disorder Brother    Neuropathy Brother      ROS:  Please see the history of present illness.   All other ROS reviewed and negative.     Physical Exam/Data:   Vitals:   02/17/19 1315 02/17/19  1330 02/17/19 1530 02/17/19 1715  BP: 134/78 127/81 120/66   Pulse: (!) 104 (!) 108 80 72  Resp: 15 (!) 21 16   Temp:      TempSrc:      SpO2: 98% 96% 94% 100%  Weight:      Height:        Intake/Output Summary (Last 24 hours) at 02/17/2019 1722 Last data filed at 02/17/2019 1612 Gross per 24 hour  Intake 600 ml  Output --  Net 600 ml   Last 3 Weights 02/17/2019 11/17/2018 07/07/2018  Weight (lbs) 250 lb 265 lb 8 oz 265 lb  Weight (kg) 113.399 kg 120.43 kg 120.203 kg     Body mass index is 40.35 kg/m.  General:  Well nourished, well developed, in no acute distress HEENT: normal Neck: no JVD Vascular: No carotid bruits Cardiac:  Irregular rhythm, regular rate, no murmur Lungs:  clear to auscultation bilaterally, no wheezing, rhonchi or rales  Abd: soft, nontender, no hepatomegaly  Ext: + edema Musculoskeletal:  No deformities, BUE and BLE strength normal and equal Skin: warm and dry  Neuro:  CNs 2-12 intact, no focal abnormalities noted Psych:  Normal affect   EKG:  The EKG was personally reviewed and demonstrates:  Appears to be coarse Afib, heart rate 114, prolonged QT Telemetry:  Telemetry was personally reviewed and demonstrates:  Not able to view  Relevant CV Studies:  Echo pending  Laboratory Data:  Chemistry Recent Labs  Lab 02/17/19 1252  NA 138  K 3.1*  CL 97*  CO2 24  GLUCOSE 152*  BUN <5*  CREATININE 1.04  CALCIUM 8.8*  GFRNONAA >60  GFRAA >60  ANIONGAP 17*  Recent Labs  Lab 02/17/19 1252  PROT 6.1*  ALBUMIN 3.5  AST 47*  ALT 22  ALKPHOS 81  BILITOT 1.3*   Hematology Recent Labs  Lab 02/17/19 1252  WBC 8.2  RBC 4.39  HGB 15.4  HCT 43.2  MCV 98.4  MCH 35.1*  MCHC 35.6  RDW 14.9  PLT 248   Cardiac EnzymesNo results for input(s): TROPONINI in the last 168 hours.  Recent Labs  Lab 02/17/19 1259  TROPIPOC 0.02    BNP Recent Labs  Lab 02/17/19 1550  BNP 112.0*    DDimer No results for input(s): DDIMER in the last 168  hours.  Radiology/Studies:  Ct Head Wo Contrast  Result Date: 02/17/2019 CLINICAL DATA:  Pain following fall EXAM: CT HEAD WITHOUT CONTRAST TECHNIQUE: Contiguous axial images were obtained from the base of the skull through the vertex without intravenous contrast. COMPARISON:  July 07, 2018 FINDINGS: Brain: There is mild diffuse atrophy. There is no intracranial mass, hemorrhage, extra-axial fluid collection, or midline shift. Brain parenchyma appears unremarkable. There is no demonstrable acute infarct. Vascular: There is no appreciable hyperdense vessel. There is calcification in each carotid siphon region. Skull: Bony calvarium appears intact. Sinuses/Orbits: There is mucosal thickening in several ethmoid air cells. Other visualized paranasal sinuses are clear. Orbits appear symmetric bilaterally. Other: Mastoid air cells are clear. IMPRESSION: Mild diffuse atrophy. Brain parenchyma appears unremarkable. No mass or hemorrhage. There are foci of carotid artery calcification. There is mucosal thickening in several ethmoid air cells. Electronically Signed   By: Lowella Grip III M.D.   On: 02/17/2019 14:47   Ct Abdomen Pelvis W Contrast  Result Date: 02/17/2019 CLINICAL DATA:  Left groin pain, nausea, diarrhea. EXAM: CT ABDOMEN AND PELVIS WITH CONTRAST TECHNIQUE: Multidetector CT imaging of the abdomen and pelvis was performed using the standard protocol following bolus administration of intravenous contrast. CONTRAST:  123m OMNIPAQUE IOHEXOL 300 MG/ML  SOLN COMPARISON:  None. FINDINGS: Lower chest: No acute abnormality. Hepatobiliary: No focal liver abnormality is seen. No gallstones, gallbladder wall thickening, or biliary dilatation. Pancreas: Unremarkable. No pancreatic ductal dilatation or surrounding inflammatory changes. Spleen: Normal in size without focal abnormality. Adrenals/Urinary Tract: Adrenal glands are unremarkable. Kidneys are normal, without renal calculi, focal lesion, or  hydronephrosis. Bladder is unremarkable. Stomach/Bowel: Stomach is within normal limits. Appendix appears normal. No evidence of bowel wall thickening, distention, or inflammatory changes. Vascular/Lymphatic: Aortic atherosclerosis. No enlarged abdominal or pelvic lymph nodes. Reproductive: Prostate is unremarkable. Other: No abdominal wall hernia or abnormality. No abdominopelvic ascites. Musculoskeletal: No acute or significant osseous findings. IMPRESSION: No acute abnormality seen in the abdomen or pelvis. Aortic Atherosclerosis (ICD10-I70.0). Electronically Signed   By: JMarijo ConceptionM.D.   On: 02/17/2019 14:54   Dg Chest Portable 1 View  Result Date: 02/17/2019 CLINICAL DATA:  Left lower chest pain since last night with shortness of breath EXAM: PORTABLE CHEST 1 VIEW COMPARISON:  01/01/2018 FINDINGS: Bilateral mild interstitial thickening. No pleural effusion or pneumothorax. Stable cardiomegaly. No aggressive osseous lesion. IMPRESSION: Cardiomegaly with mild pulmonary vascular congestion. Electronically Signed   By: HKathreen Devoid  On: 02/17/2019 13:08    Assessment and Plan:   1. Atrial fibrillation RVR - appears to be new onset - risk factors include obesity and OSA not compliant on CPAP - TSH pending, Mg pending - check an echo - will give first dose of lopressor 25 mg now - start eliquis for anticoagulation   2. Hypokalemia - hypokalemic on admission -  K 3.1 - replaced with 10 mEq IV with PO dose scheduled - BMP in the morning   3. OSA on CPAP - not compliant - not compliant, per wife - may need a new mask   4. Mild pulmonary congestion on CXR - check an echo - may need some light diuresis given Afib   5. Hypertension - home med: norvasc 10 mg daily - continue this for now   6. Obesity  - encourage weight loss     For questions or updates, please contact [place] Please consult [email or web address] for contact info under     Signed, Ledora Bottcher, PA   02/17/2019 5:22 PM   Patient seen and examined. Agree with assessment and plan.  Mr. Raydell Maners is a 68 year old gentleman who has a history of anxiety/depression, obesity, GERD, obstructive sleep apnea poorly treated followed by neurology, diabetes mellitus, hyperlipidemia, who presented to the emergency room today some confusion and was found to be in atrial fibrillation with RVR.  Apparently, last evening the patient hardly ate dinner and only had 1 bite of steak and cheese sandwich.  He became nauseated overnight and had vomiting on multiple occasions.  This morning he called the fire department to check his blood pressure which was elevated.  EMS was called and upon arrival to the emergency room telemetry and ECG suggested atrial fibrillation with a ventricular rate at 114 bpm.  The patient admits to poor compliance to CPAPas result of not able to tolerate the 2 CPAP masks that he had been prescribed by a sleep physician.  He denies any associated chest tightness or anginal type symptoms.  Has a history of dementia.  Laboratory was notable for potassium of 3.1 and normal renal function with a BUN of less than 5.  Magnesium was not drawn.  NP was 112.  Initial troponin was negative at 0.02.  The patient will now be admitted for observation.  There was some confusion in the emergency room and he was transported reportedly to another room but ultimately ended up being transferred to several additional rooms and is now on 4 E and is here now with his wife.  He is now much more comfortable.  A new ECG was just obtained which now confirms restoration of sinus rhythm with normal sinus rhythm at 83 bpm and nonspecific interventricular block.  Exam is notable for morbid obesity.  Blood pressure initially elevated had improved to 120/66.  HEENT was unremarkable.  He had a thick neck.  There was no wheezing.  Rhythm was now regular in the 70s to 80s without apparent ectopy.  He had moderate central adiposity.  Bowel  sounds are present in 4 quadrants.  Abdomen was nontender.  There was 1+ pitting edema around his ankles and feet bilaterally.  Neurologic exam was grossly nonfocal.  At present, will admit the patient at least overnight for observation.  Will initiate metoprolol 25 mg twice a day.  Consider decreasing amlodipine from 10 mg to 5 mg since this may be contributing to peripheral edema or possibly consider changing to Cardizem.  Will check magnesium and replete if low.  We will replete potassium to a level of at least 4.  Plan 2D echo Doppler study in a.m.  Patient will need improved CPAP compliance and I discussed with him the increased recurrent risk of atrial fibrillation if sleep apnea is left untreated.  We discussed initiation of anticoagulation and would consider at least short-term initiation of Eliquis pending further evaluation.  Troy Sine, MD, [facility] 02/17/2019 7:12 PM

## 2019-02-17 NOTE — Progress Notes (Signed)
ANTICOAGULATION CONSULT NOTE - Initial Consult  Pharmacy Consult for enoxaparin Indication: atrial fibrillation  Allergies  Allergen Reactions  . Losartan Potassium-Hctz Diarrhea  . Zoloft [Sertraline Hcl] Diarrhea    Patient Measurements: Height: 5' 6"  (167.6 cm) Weight: 250 lb (113.4 kg) IBW/kg (Calculated) : 63.8 HEPARIN DW (KG): 89.8  Vital Signs: Temp: 98.4 F (36.9 C) (06/09 1235) Temp Source: Oral (06/09 1235) BP: 120/66 (06/09 1530) Pulse Rate: 80 (06/09 1530)  Labs: Recent Labs    02/17/19 1252  HGB 15.4  HCT 43.2  PLT 248  CREATININE 1.04    Estimated Creatinine Clearance: 79.3 mL/min (by C-G formula based on SCr of 1.04 mg/dL).   Medical History: Past Medical History:  Diagnosis Date  . Adenomatous colon polyp 2016  . Allergy   . Anxiety   . Bell palsy    resolved  . Chicken pox   . Depression   . Diet-controlled diabetes mellitus ([place])   . Eating disorder   . GERD (gastroesophageal reflux disease)   . History of vitamin D deficiency   . Hyperlipidemia   . Hypertension   . Hypokalemia   . Memory loss   . MI (myocardial infarction) ([place])    per pt   . Ulcerative colitis ([place]) 2005-2006   severe     Assessment: [name] Bienenstock Sr. Is a 68yo male admitted with a chief complaint of dizziness and weakness. He was found to be in new-onset afib. Pharmacy has been consulted to start enoxaparin for afib. CBC stable with Hgb 15.4 and pltc 248. Scr 1.04 with CrCl 79.7m/min. Body weight this admit 113kg.  Goal of Therapy:  Monitor platelets by anticoagulation protocol: Yes   Plan:  Enoxaparin 1172mBID Monitor CBC and renal function Could consider anti-Xa level at steady state  Thank you for involving pharmacy in this patient's care.  DyJanae BridgemanPharmD PGY1 Pharmacy Resident Phone: [phone]/05/2019 4:14 PM

## 2019-02-17 NOTE — H&P (Signed)
History and Physical    [name] BVQ:[id number] DOB: 10-01-1950 DOA: 02/17/2019  PCP: [name], DO  Patient coming from: Home  I have personally briefly reviewed patient's old medical records in [place]  Chief Complaint: Dizziness and odd mental status  HPI: [name] NORBERTO Sr. is a 68 y.o. male with medical history significant of anxiety, depression, GERD, hyperlipidemia, hypertension, myocardial infarction, memory loss, and ulcerative colitis presents emergency department due to complaints of dizziness and not acting himself.  Patient reports he awoke approximately 230 this morning to have several bowel movements which is not uncommon for him due to his ulcerative colitis.  He felt weak and then he struck his head against the wall but head strike was minimal.  He has had more urinary frequency since last week and reports that he had an echocardiogram at the 12th of last month (no echocardiogram noted in our system or care everywhere).  Has a family history of coronary disease and states he has been following closely with his primary physician, [name] Pouch, DO.  Patient states he takes 27 medications on a daily basis and according to his record he has been having memory issues more frequently since January 2020.  Further information is very difficult to obtain from the patient.  After I examined him his wife arrived who confirmed that he has been having memory problems but was shocked when I use the word dementia.  She confirmed most of the patient's history stated he gets this way when he is anxious.  She also reported a history of sleep apnea the patient is not compliant with CPAP because he says he cannot breathe with it.  The patient was seeing the neurologist who urged him to continue trying different masks for his CPAP and the patient became annoyed.  She had set up an evaluation for dementia and he canceled the appointment.  Fairly labile during the interview began lamenting  the need for admission.  ED Course: Found to be in onset atrial fibrillation.  Laboratory data significant for hypokalemia and elevated blood glucose, first troponin was negative, chest x-ray mild pulmonary vascular congestion.  CT of the head shows mild diffuse atrophy brain parenchyma unremarkable and no masses or hemorrhage.  He has foci of carotid artery calcifications. Furred to me due to new onset atrial fibrillation, hypokalemia and behavioral change consistent with altered mental status.  Symmetry monitoring shows he may have flipped back into normal sinus rhythm.  Patient insisting that he has to go home because he does not believe he needs admission.  Fortunately patient's wife arrived and calmed him down.  Review of Systems: As per HPI otherwise all other systems reviewed and  negative.   Past Medical History:  Diagnosis Date   Adenomatous colon polyp 2016   Allergy    Anxiety    Bell palsy    resolved   Chicken pox    Depression    Diet-controlled diabetes mellitus ([place])    Eating disorder    GERD (gastroesophageal reflux disease)    History of vitamin D deficiency    Hyperlipidemia    Hypertension    Hypokalemia    Memory loss    MI (myocardial infarction) ([place])    per pt    Ulcerative colitis ([place]) 2005-2006   severe     Past Surgical History:  Procedure Laterality Date   COLONOSCOPY WITH PROPOFOL N/A 07/15/2014   Procedure: COLONOSCOPY WITH PROPOFOL;  Surgeon: [name],  MD;  Location: [facility];  Service: Endoscopy;  Laterality: N/A;   COLONOSCOPY WITH PROPOFOL N/A 08/22/2015   Procedure: COLONOSCOPY WITH PROPOFOL;  Surgeon: [name], MD;  Location: [facility];  Service: Endoscopy;  Laterality: N/A;   ORIF ANKLE FRACTURE  02/27/2012   Procedure: OPEN REDUCTION INTERNAL FIXATION (ORIF) ANKLE FRACTURE;  Surgeon: Colin Rhein, MD;  Location: [place];  Service: Orthopedics;  Laterality: Left;  ORIF left  bimalleolar ankle fracture   SHOULDER SURGERY     LEFT   SIGMOIDOSCOPY  10/08/2018   Externa and Internal Hmorrhoids. Tubular and Tublovillous adenoma removed from transverse colon. Inflammatory pseudopolyps, negative for dysplasia   TONSILLECTOMY     UPPER GASTROINTESTINAL ENDOSCOPY      Social History   Social History Narrative   Lives at home with wife [name].   College-educated. Works in Public house manager.      reports that he quit smoking about 37 years ago. He has a 19.00 pack-year smoking history. He quit smokeless tobacco use about 30 years ago. He reports current alcohol use. He reports that he does not use drugs.  Allergies  Allergen Reactions   Losartan Potassium-Hctz Diarrhea   Zoloft [Sertraline Hcl] Diarrhea    Family History  Problem Relation Age of Onset   Dementia Mother    Hypertension Mother    Early death Father 48       drowning    Post-traumatic stress disorder Brother    Neuropathy Brother    Prior to Admission medications   Medication Sig Start Date End Date Taking? Authorizing Provider  amLODipine (NORVASC) 10 MG tablet Take 1 tablet (10 mg total) by mouth daily. 11/17/18   [name], [name], DO  azaTHIOprine (IMURAN) 50 MG tablet Take 200 mg by mouth every morning.     [provider]  Cyanocobalamin (VITAMIN B 12 PO) Take 1 tablet by mouth daily.    [provider]  finasteride (PROSCAR) 5 MG tablet Take 5 mg by mouth daily.    [provider]  FLUoxetine (PROZAC) 20 MG capsule Take 3 capsules (60 mg total) by mouth daily. 11/17/18   [name], [name], DO  fluticasone (FLONASE) 50 MCG/ACT nasal spray Place 2 sprays into both nostrils daily. Patient taking differently: Place 2 sprays into both nostrils daily as needed for allergies or rhinitis.  12/18/17   [name], [name], DO  KLOR-CON M10 10 MEQ tablet TAKE 1 TABLET BY MOUTH EVERY DAY 12/29/18   [name], [name], DO  levocetirizine (XYZAL) 5 MG tablet TAKE  1/2 TABLET BY MOUTH EVERY EVENING 12/16/18   [name], [name], DO  loperamide (ANTI-DIARRHEAL) 2 MG tablet Take 1 tablet (2 mg total) by mouth daily as needed. Patient taking differently: Take 2 mg by mouth daily as needed for diarrhea or loose stools.  11/29/17   [name], [name], DO  LORazepam (ATIVAN) 1 MG tablet Take 1 tablet (1 mg total) by mouth 3 (three) times daily. 11/17/18   [name], [name], DO  Multiple Vitamin (MULTIVITAMIN WITH MINERALS) TABS tablet Take 1 tablet by mouth daily.    [provider]  pantoprazole (PROTONIX) 20 MG tablet Take 1 tablet (20 mg total) by mouth daily. 11/17/18   [name], [name], DO  potassium chloride (K-DUR) 10 MEQ tablet Take 1 tablet (10 mEq total) by mouth daily. 01/06/18   [name], [name], DO  QUEtiapine (SEROQUEL) 100 MG tablet Take 1 tablet (100 mg total) by  mouth at bedtime. 11/17/18   [name], DO    Physical Exam:  Constitutional: NAD, calm, comfortable, super obesity Vitals:   02/17/19 1300 02/17/19 1315 02/17/19 1330 02/17/19 1530  BP: (!) 163/85 134/78 127/81 120/66  Pulse: 97 (!) 104 (!) 108 80  Resp: (!) 22 15 (!) 21 16  Temp:      TempSrc:      SpO2: 98% 98% 96% 94%  Weight:      Height:       Eyes: PERRL, lids and conjunctivae normal ENMT: Mucous membranes are moist. Posterior pharynx clear of any exudate or lesions.Normal dentition.  Neck: normal, supple, no masses, no thyromegaly Respiratory: clear to auscultation bilaterally, no wheezing, no crackles. Normal respiratory effort. No accessory muscle use.  Cardiovascular: Regular rate and rhythm, no murmurs / rubs / gallops. No extremity edema. 2+ pedal pulses. No carotid bruits.  Abdomen: no tenderness, no masses palpated. No hepatosplenomegaly. Bowel sounds positive.  Musculoskeletal: no clubbing / cyanosis. No joint deformity upper and lower extremities. Good ROM, no contractures. Normal muscle tone.  Skin: no rashes, lesions, ulcers. No induration Neurologic: CN 2-12  grossly intact. Sensation intact, DTR normal. Strength 5/5 in all 4.  Psychiatric: Normal judgment and poor insight insight. Alert and oriented x 3.  Labile mood   Labs on Admission: I have personally reviewed following labs and imaging studies  CBC: Recent Labs  Lab 02/17/19 1252  WBC 8.2  NEUTROABS 7.4  HGB 15.4  HCT 43.2  MCV 98.4  PLT 465   Basic Metabolic Panel: Recent Labs  Lab 02/17/19 1252  NA 138  K 3.1*  CL 97*  CO2 24  GLUCOSE 152*  BUN <5*  CREATININE 1.04  CALCIUM 8.8*   GFR: Estimated Creatinine Clearance: 79.3 mL/min (by C-G formula based on SCr of 1.04 mg/dL). Liver Function Tests: Recent Labs  Lab 02/17/19 1252  AST 47*  ALT 22  ALKPHOS 81  BILITOT 1.3*  PROT 6.1*  ALBUMIN 3.5   Recent Labs  Lab 02/17/19 1300  LIPASE 23   Urine analysis:    Component Value Date/Time   COLORURINE YELLOW 02/17/2019 1507   APPEARANCEUR CLEAR 02/17/2019 1507   LABSPEC 1.016 02/17/2019 1507   PHURINE 8.0 02/17/2019 1507   GLUCOSEU NEGATIVE 02/17/2019 1507   HGBUR NEGATIVE 02/17/2019 1507   BILIRUBINUR NEGATIVE 02/17/2019 1507   BILIRUBINUR negative 01/03/2018 0959   KETONESUR 5 (A) 02/17/2019 1507   PROTEINUR 30 (A) 02/17/2019 1507   UROBILINOGEN 0.2 01/03/2018 0959   NITRITE NEGATIVE 02/17/2019 1507   LEUKOCYTESUR NEGATIVE 02/17/2019 1507    Radiological Exams on Admission: Ct Head Wo Contrast  Result Date: 02/17/2019 CLINICAL DATA:  Pain following fall EXAM: CT HEAD WITHOUT CONTRAST TECHNIQUE: Contiguous axial images were obtained from the base of the skull through the vertex without intravenous contrast. COMPARISON:  July 07, 2018 FINDINGS: Brain: There is mild diffuse atrophy. There is no intracranial mass, hemorrhage, extra-axial fluid collection, or midline shift. Brain parenchyma appears unremarkable. There is no demonstrable acute infarct. Vascular: There is no appreciable hyperdense vessel. There is calcification in each carotid siphon  region. Skull: Bony calvarium appears intact. Sinuses/Orbits: There is mucosal thickening in several ethmoid air cells. Other visualized paranasal sinuses are clear. Orbits appear symmetric bilaterally. Other: Mastoid air cells are clear. IMPRESSION: Mild diffuse atrophy. Brain parenchyma appears unremarkable. No mass or hemorrhage. There are foci of carotid artery calcification. There is mucosal thickening in several ethmoid air cells. Electronically Signed  By: Lowella Grip III M.D.   On: 02/17/2019 14:47   Ct Abdomen Pelvis W Contrast  Result Date: 02/17/2019 CLINICAL DATA:  Left groin pain, nausea, diarrhea. EXAM: CT ABDOMEN AND PELVIS WITH CONTRAST TECHNIQUE: Multidetector CT imaging of the abdomen and pelvis was performed using the standard protocol following bolus administration of intravenous contrast. CONTRAST:  1103m OMNIPAQUE IOHEXOL 300 MG/ML  SOLN COMPARISON:  None. FINDINGS: Lower chest: No acute abnormality. Hepatobiliary: No focal liver abnormality is seen. No gallstones, gallbladder wall thickening, or biliary dilatation. Pancreas: Unremarkable. No pancreatic ductal dilatation or surrounding inflammatory changes. Spleen: Normal in size without focal abnormality. Adrenals/Urinary Tract: Adrenal glands are unremarkable. Kidneys are normal, without renal calculi, focal lesion, or hydronephrosis. Bladder is unremarkable. Stomach/Bowel: Stomach is within normal limits. Appendix appears normal. No evidence of bowel wall thickening, distention, or inflammatory changes. Vascular/Lymphatic: Aortic atherosclerosis. No enlarged abdominal or pelvic lymph nodes. Reproductive: Prostate is unremarkable. Other: No abdominal wall hernia or abnormality. No abdominopelvic ascites. Musculoskeletal: No acute or significant osseous findings. IMPRESSION: No acute abnormality seen in the abdomen or pelvis. Aortic Atherosclerosis (ICD10-I70.0). Electronically Signed   By: JMarijo ConceptionM.D.   On: 02/17/2019 14:54    Dg Chest Portable 1 View  Result Date: 02/17/2019 CLINICAL DATA:  Left lower chest pain since last night with shortness of breath EXAM: PORTABLE CHEST 1 VIEW COMPARISON:  01/01/2018 FINDINGS: Bilateral mild interstitial thickening. No pleural effusion or pneumothorax. Stable cardiomegaly. No aggressive osseous lesion. IMPRESSION: Cardiomegaly with mild pulmonary vascular congestion. Electronically Signed   By: HKathreen Devoid  On: 02/17/2019 13:08    EKG: Independently reviewed.  Initial EKG shows atrial fibrillation with nonspecific interventricular conduction delay with left axis deviation and old anterior septal infarct.  No significant change when compared from 01/01/2018.  Assessment/Plan Principal Problem:   Atrial fibrillation with rapid ventricular response (HCC) Active Problems:   Moderate obstructive sleep apnea   Dementia with behavioral disturbance (HCC)   Hypokalemia   Major depression, recurrent, chronic (HCC)   Ulcerative colitis with complication (HCC)   Class 3 severe obesity due to excess calories with body mass index (BMI) of 40.0 to 44.9 in adult ([facility]   Benzodiazepine dependence ([place]   Essential hypertension   GAD (generalized anxiety disorder)    1.  Paroxysmal atrial fibrillation with rapid ventricular response: Looks like this is paroxysmal versus lone as it is stopped.  Consult cardiology.  Will start Lovenox as I am unsure as to whether the patient will require long-term anticoagulation.  His chads vas 2 score is 2.  We will continue metoprolol 25 milligrams twice daily orally.  2.  Moderate obstructive sleep apnea: Likely this is contributing to patient's behavioral disorder and dementia and confusion.  He apparently is not using his CPAP he falls asleep during the day.  He will need extensive counseling and urging to use his CPAP.  He also has had trouble with the mask not fitting properly.  Will ask respiratory therapy to evaluate.  3. Dementia with behavioral  disturbance: Clearly exacerbated by sleep apnea and being untreated.  He currently takes fluoxetine as well as Seroquel.  Patient will certainly need follow-up with a neurologist.  I have consulted speech therapy for cognition evaluation and will check an MRI of the brain given his sudden change in behavior.  4.  Hypokalemia: He is being repleted IV will recheck in a.m.  Will reorder his home dose of potassium but may need more going forward.  5.  Recurrent chronic major depression: Continue fluoxetine.  6.  Ulcerative colitis with complications: Continue Imuran.  7.  Class III severe obesity due to excess calories with a BMI of 40-44.9: Will need extensive dietary counseling.  8.  Benzodiazepine dependence: Continue 3 times daily Ativan which he takes at home.  9.  Essential hypertension: Continue home medications.  Metoprolol has been added to amlodipine.  10.  Generalized anxiety disorder: Noted patient tearful and labile will continue Ativan as at home.    DVT prophylaxis: Full dose Lovenox Code Status: Full code Family Communication: Spoke with patient's wife who arrived after my evaluation of the patient.  Updated her on patient's situation and plans for hospitalization.  She is in agreement with the plans. Disposition Plan: Likely home in 2 to 3 days Consults called: Speech therapy, cardiology via cards master Admission status: Inpatient  Admit - It is my clinical opinion that admission to INPATIENT is reasonable and necessary because of the expectation that this patient will require hospital care that crosses at least 2 midnights to treat this condition based on the medical complexity of the problems presented.  Given the aforementioned information, the predictability of an adverse outcome is felt to be significant.  Lady Deutscher MD FACP [facility] Pager [phone]  How to contact the [facility] Attending or Consulting provider [place] or covering provider during after  hours [place], for this patient?  1. Check the care team in [facility] and look for a) attending/consulting [facility] provider listed and b) the [facility] team listed 2. Log into [email or web address] and use [place]'s universal password to access. If you do not have the password, please contact the hospital operator. 3. Locate the [facility] provider you are looking for under [facility] and page to a number that you can be directly reached. 4. If you still have difficulty reaching the provider, please page the [facility] (Director on Call) for the Hospitalists listed on amion for assistance.  If 7PM-7AM, please contact night-coverage [email or web address] Password [email or web address]  02/17/2019, 4:32 PM

## 2019-02-18 ENCOUNTER — Inpatient Hospital Stay (HOSPITAL_COMMUNITY): Payer: Medicare HMO

## 2019-02-18 ENCOUNTER — Encounter: Payer: Self-pay | Admitting: Family Medicine

## 2019-02-18 DIAGNOSIS — K51919 Ulcerative colitis, unspecified with unspecified complications: Secondary | ICD-10-CM | POA: Diagnosis not present

## 2019-02-18 DIAGNOSIS — F339 Major depressive disorder, recurrent, unspecified: Secondary | ICD-10-CM | POA: Diagnosis not present

## 2019-02-18 DIAGNOSIS — I480 Paroxysmal atrial fibrillation: Secondary | ICD-10-CM | POA: Diagnosis not present

## 2019-02-18 DIAGNOSIS — Z20828 Contact with and (suspected) exposure to other viral communicable diseases: Secondary | ICD-10-CM | POA: Diagnosis not present

## 2019-02-18 DIAGNOSIS — I5030 Unspecified diastolic (congestive) heart failure: Secondary | ICD-10-CM | POA: Diagnosis not present

## 2019-02-18 DIAGNOSIS — E876 Hypokalemia: Secondary | ICD-10-CM

## 2019-02-18 DIAGNOSIS — F1320 Sedative, hypnotic or anxiolytic dependence, uncomplicated: Secondary | ICD-10-CM | POA: Diagnosis not present

## 2019-02-18 DIAGNOSIS — I110 Hypertensive heart disease with heart failure: Secondary | ICD-10-CM

## 2019-02-18 DIAGNOSIS — I6529 Occlusion and stenosis of unspecified carotid artery: Secondary | ICD-10-CM | POA: Insufficient documentation

## 2019-02-18 DIAGNOSIS — I517 Cardiomegaly: Secondary | ICD-10-CM | POA: Insufficient documentation

## 2019-02-18 DIAGNOSIS — Z6841 Body Mass Index (BMI) 40.0 and over, adult: Secondary | ICD-10-CM | POA: Diagnosis not present

## 2019-02-18 DIAGNOSIS — I340 Nonrheumatic mitral (valve) insufficiency: Secondary | ICD-10-CM

## 2019-02-18 DIAGNOSIS — F0391 Unspecified dementia with behavioral disturbance: Secondary | ICD-10-CM | POA: Diagnosis not present

## 2019-02-18 DIAGNOSIS — I700 Atherosclerosis of aorta: Secondary | ICD-10-CM | POA: Insufficient documentation

## 2019-02-18 LAB — TROPONIN I
Troponin I: <0.03 ng/mL (ref <0.03)
Troponin I: <0.03 ng/mL (ref <0.03)

## 2019-02-18 LAB — LIPID PANEL
Cholesterol: 188 mg/dL (ref 0–200)
HDL: 44 mg/dL (ref >40)
LDL Cholesterol: 111 mg/dL — ABNORMAL HIGH (ref 0–99)
Total CHOL/HDL Ratio: 4.3 RATIO
Triglycerides: 164 mg/dL — ABNORMAL HIGH (ref <150)
VLDL: 33 mg/dL (ref 0–40)

## 2019-02-18 LAB — ECHOCARDIOGRAM COMPLETE
Height: 68 in
Weight: 3939.2 oz

## 2019-02-18 LAB — BASIC METABOLIC PANEL
Anion gap: 12 (ref 5–15)
BUN: <5 mg/dL — ABNORMAL LOW (ref 8–23)
CO2: 29 mmol/L (ref 22–32)
Calcium: 8.6 mg/dL — ABNORMAL LOW (ref 8.9–10.3)
Chloride: 97 mmol/L — ABNORMAL LOW (ref 98–111)
Creatinine, Ser: 0.9 mg/dL (ref 0.61–1.24)
GFR calc Af Amer: >60 mL/min (ref >60)
GFR calc non Af Amer: >60 mL/min (ref >60)
Glucose, Bld: 118 mg/dL — ABNORMAL HIGH (ref 70–99)
Potassium: 2.9 mmol/L — ABNORMAL LOW (ref 3.5–5.1)
Sodium: 138 mmol/L (ref 135–145)

## 2019-02-18 LAB — CBC
HCT: 38.1 % — ABNORMAL LOW (ref 39.0–52.0)
Hemoglobin: 13.5 g/dL (ref 13.0–17.0)
MCH: 34.8 pg — ABNORMAL HIGH (ref 26.0–34.0)
MCHC: 35.4 g/dL (ref 30.0–36.0)
MCV: 98.2 fL (ref 80.0–100.0)
Platelets: 186 10*3/uL (ref 150–400)
RBC: 3.88 MIL/uL — ABNORMAL LOW (ref 4.22–5.81)
RDW: 15.5 % (ref 11.5–15.5)
WBC: 7 10*3/uL (ref 4.0–10.5)
nRBC: 0 % (ref 0.0–0.2)

## 2019-02-18 LAB — MAGNESIUM: Magnesium: 1.5 mg/dL — ABNORMAL LOW (ref 1.7–2.4)

## 2019-02-18 LAB — GLUCOSE, CAPILLARY: Glucose-Capillary: 117 mg/dL — ABNORMAL HIGH (ref 70–99)

## 2019-02-18 LAB — T4, FREE: Free T4: 0.81 ng/dL (ref 0.61–1.12)

## 2019-02-18 MED ORDER — POTASSIUM CHLORIDE CRYS ER 20 MEQ PO TBCR
40.0000 meq | EXTENDED_RELEASE_TABLET | ORAL | Status: DC
  Administered 2019-02-18: 40 meq via ORAL
  Filled 2019-02-18: qty 2

## 2019-02-18 MED ORDER — AMLODIPINE BESYLATE 10 MG PO TABS: 5.0000 mg | ORAL_TABLET | Freq: Every day | ORAL | 1 refills | Status: DC

## 2019-02-18 MED ORDER — METOPROLOL TARTRATE 25 MG PO TABS: 25.0000 mg | ORAL_TABLET | Freq: Two times a day (BID) | ORAL | 5 refills | Status: DC

## 2019-02-18 MED ORDER — POTASSIUM CHLORIDE CRYS ER 20 MEQ PO TBCR
20.0000 meq | EXTENDED_RELEASE_TABLET | Freq: Two times a day (BID) | ORAL | Status: DC
  Administered 2019-02-18: 20 meq via ORAL
  Filled 2019-02-18: qty 1

## 2019-02-18 MED ORDER — LEVOTHYROXINE SODIUM 25 MCG PO TABS: 25.0000 ug | ORAL_TABLET | Freq: Every day | ORAL | 5 refills | Status: DC

## 2019-02-18 MED ORDER — APIXABAN 5 MG PO TABS: 5.0000 mg | ORAL_TABLET | Freq: Two times a day (BID) | ORAL | 5 refills | Status: DC

## 2019-02-18 MED ORDER — SPIRONOLACTONE 25 MG PO TABS: 25.0000 mg | ORAL_TABLET | Freq: Every day | ORAL | 5 refills | Status: DC

## 2019-02-18 MED ORDER — AMLODIPINE BESYLATE 5 MG PO TABS
5.0000 mg | ORAL_TABLET | Freq: Every day | ORAL | Status: DC
  Administered 2019-02-18: 08:00:00 5 mg via ORAL
  Filled 2019-02-18: qty 1

## 2019-02-18 MED ORDER — MAGNESIUM SULFATE 2 GM/50ML IV SOLN
2.0000 g | Freq: Once | INTRAVENOUS | Status: AC
  Administered 2019-02-18: 2 g via INTRAVENOUS
  Filled 2019-02-18: qty 50

## 2019-02-18 MED ORDER — SPIRONOLACTONE 25 MG PO TABS
25.0000 mg | ORAL_TABLET | Freq: Every day | ORAL | Status: DC
  Administered 2019-02-18: 25 mg via ORAL
  Filled 2019-02-18: qty 1

## 2019-02-18 NOTE — Progress Notes (Signed)
Echocardiogram 2D Echocardiogram has been performed.  [name] 02/18/2019, 10:08 AM

## 2019-02-18 NOTE — TOC Benefit Eligibility Note (Signed)
Transition of Care [facility]) Benefit Eligibility Note    Patient Details  Name: CAMAURI CRATON Sr. MRN: [id number] Date of Birth: 07-07-1951   Medication/Dose: ELIQUIS  5 MG BID  Covered?: Yes  Tier: 3 Drug  Prescription Coverage Preferred Pharmacy: YES([facility] AND [facility])  Spoke with Person/Company/Phone Number:: STEPHANIE  [facility] RX # [phone])  Co-Pay: [facility]- $ 45.00   AND     [facility]  Prior Approval: No  Deductible: Met       Memory [employer] Phone Number: 02/18/2019, 11:26 AM

## 2019-02-18 NOTE — TOC Transition Note (Signed)
Transition of Care [facility]) - CM/SW Discharge Note Marvetta Gibbons RN, BSN Transitions of Care Unit 4E- RN Case Manager [phone]   Patient Details  Name: [name] STEAD Sr. MRN: [id number] Date of Birth: 1951-01-08  Transition of Care [facility]) CM/SW Contact:  Dawayne Patricia, RN Phone Number: 02/18/2019, 2:26 PM   Clinical Narrative:    Pt admitted with afib, started on Eliquis- per benefit check copay cost for drug $45. CM spoke with pt at bedside [facility]- 30 day free card provided to use- and info given on copay cost. Pt very appreciative of care here and thankful to staff.  Wife at bedside for transport home.    Final next level of care: Home/Self Care Barriers to Discharge: No Barriers Identified   Patient Goals and CMS Choice Patient states their goals for this hospitalization and ongoing recovery are:: "to stay well" CMS Medicare.gov Compare Post Acute Care list provided to:: Patient Choice offered to / list presented to : NA  Discharge Placement    Home with wife                 Discharge Plan and Services   Discharge Planning Services: CM Consult, Medication Assistance Post Acute Care Choice: NA          DME Arranged: N/A DME Agency: NA       HH Arranged: NA HH Agency: NA        Social Determinants of Health (SDOH) Interventions     Readmission Risk Interventions Readmission Risk Prevention Plan 02/18/2019  Transportation Screening Complete  PCP or Specialist Appt within 3-5 Days Complete  HRI or [place] Complete  Social Work Consult for [place] Planning/Counseling Complete  Palliative Care Screening Not Applicable  Medication Review Press photographer) Complete  Some recent data might be hidden

## 2019-02-18 NOTE — Discharge Summary (Addendum)
Physician Discharge Summary  [name] Sr. KGM:[id number] DOB: 1950-12-11 DOA: 02/17/2019  PCP: Howard Pouch A, DO  Admit date: 02/17/2019 Discharge date: 02/18/2019  Admitted From: Home Disposition: Home  Recommendations for Outpatient Follow-up:  1. Follow up with PCP in 1-2 weeks 2. Follow-up with your pulmonology about sleep apnea machine.  Home Health: Not applicable Equipment/Devices: Not applicable  Discharge Condition: Stable CODE STATUS: Full code Diet recommendation: Low-salt diet  Brief/Interim Summary: 68 year old gentleman with history of anxiety, depression, chronic benzo dependence, GERD, hyperlipidemia, hypertension, memory loss, ulcerative colitis presented to the emergency room with complaints of dizziness and not acting himself.  In the emergency room, he was found to have A. fib.  He was noncompliant to CPAP.  Discharge Diagnoses:  Principal Problem:   Atrial fibrillation with rapid ventricular response (HCC) Active Problems:   Major depression, recurrent, chronic (HCC)   Ulcerative colitis with complication (HCC)   Moderate obstructive sleep apnea   Class 3 severe obesity due to excess calories with body mass index (BMI) of 40.0 to 44.9 in adult [facility])   Benzodiazepine dependence (HCC)   Essential hypertension   GAD (generalized anxiety disorder)   Dementia with behavioral disturbance (HCC)   Hypokalemia  Paroxysmal A. fib with RVR: Previously unknown.  Probably new onset.  Treated with metoprolol and converted to sinus rhythm.  Currently asymptomatic.  Potassium was 2.9, aggressively replaced and discharged to continue potassium.  Magnesium was 1.5, replaced before discharge.  2D echocardiogram essentially normal. Converted to sinus rhythm on metoprolol that he will continue.  Started on Eliquis for stroke prevention. TSH was more than 8, T3-T4 has been sent results are pending, cardiology recommended to start on low-dose levothyroxine. Counseling done  regarding compliance to CPAP. Electrolytes replaced before discharge. Started on Aldactone and metoprolol, decreasing dose of amlodipine.  Admission status: Patient was admitted as inpatient with anticipation of more than 2 midnight stay because of electrolyte abnormalities, rapid A. fib that was new onset, patient had all the investigations done today in the hospital.  He was treated aggressively with electrolyte replacement and resumption of new medications.  With all the interventions, patient had rapid improvement of symptoms so he is going home today with outpatient follow-up.  Discharge Instructions  Discharge Instructions    Diet - low sodium heart healthy   Complete by:  As directed    Increase activity slowly   Complete by:  As directed      Allergies as of 02/18/2019      Reactions   Losartan Potassium-hctz Diarrhea   Zoloft [sertraline Hcl] Diarrhea      Medication List    TAKE these medications   amLODipine 10 MG tablet Commonly known as:  NORVASC Take 0.5 tablets (5 mg total) by mouth daily. What changed:  how much to take   apixaban 5 MG Tabs tablet Commonly known as:  ELIQUIS Take 1 tablet (5 mg total) by mouth 2 (two) times daily.   azaTHIOprine 50 MG tablet Commonly known as:  IMURAN Take 200 mg by mouth every morning.   dicyclomine 20 MG tablet Commonly known as:  BENTYL Take 20 mg by mouth 4 (four) times daily -  before meals and at bedtime.   finasteride 5 MG tablet Commonly known as:  PROSCAR Take 5 mg by mouth daily.   FLUoxetine 20 MG capsule Commonly known as:  PROZAC Take 3 capsules (60 mg total) by mouth daily. What changed:    how much to take  when to take this   fluticasone 50 MCG/ACT nasal spray Commonly known as:  FLONASE Place 2 sprays into both nostrils daily. What changed:    when to take this  reasons to take this   Klor-Con M10 10 MEQ tablet Generic drug:  potassium chloride TAKE 1 TABLET BY MOUTH EVERY DAY What  changed:  how much to take   levocetirizine 5 MG tablet Commonly known as:  XYZAL TAKE 1/2 TABLET BY MOUTH EVERY EVENING   levothyroxine 25 MCG tablet Commonly known as:  Synthroid Take 1 tablet (25 mcg total) by mouth daily before breakfast for 30 days.   loperamide 2 MG tablet Commonly known as:  Anti-Diarrheal Take 1 tablet (2 mg total) by mouth daily as needed. What changed:  reasons to take this   LORazepam 1 MG tablet Commonly known as:  ATIVAN Take 1 tablet (1 mg total) by mouth 3 (three) times daily.   metoprolol tartrate 25 MG tablet Commonly known as:  LOPRESSOR Take 1 tablet (25 mg total) by mouth 2 (two) times daily for 30 days.   multivitamin with minerals Tabs tablet Take 1 tablet by mouth daily.   pantoprazole 20 MG tablet Commonly known as:  PROTONIX Take 1 tablet (20 mg total) by mouth daily.   QUEtiapine 100 MG tablet Commonly known as:  SEROQUEL Take 1 tablet (100 mg total) by mouth at bedtime.   spironolactone 25 MG tablet Commonly known as:  ALDACTONE Take 1 tablet (25 mg total) by mouth daily for 30 days.       Allergies  Allergen Reactions  . Losartan Potassium-Hctz Diarrhea  . Zoloft [Sertraline Hcl] Diarrhea    Consultations:  Cardiology.   Procedures/Studies: Ct Head Wo Contrast  Result Date: 02/17/2019 CLINICAL DATA:  Pain following fall EXAM: CT HEAD WITHOUT CONTRAST TECHNIQUE: Contiguous axial images were obtained from the base of the skull through the vertex without intravenous contrast. COMPARISON:  July 07, 2018 FINDINGS: Brain: There is mild diffuse atrophy. There is no intracranial mass, hemorrhage, extra-axial fluid collection, or midline shift. Brain parenchyma appears unremarkable. There is no demonstrable acute infarct. Vascular: There is no appreciable hyperdense vessel. There is calcification in each carotid siphon region. Skull: Bony calvarium appears intact. Sinuses/Orbits: There is mucosal thickening in several  ethmoid air cells. Other visualized paranasal sinuses are clear. Orbits appear symmetric bilaterally. Other: Mastoid air cells are clear. IMPRESSION: Mild diffuse atrophy. Brain parenchyma appears unremarkable. No mass or hemorrhage. There are foci of carotid artery calcification. There is mucosal thickening in several ethmoid air cells. Electronically Signed   By: Lowella Grip III M.D.   On: 02/17/2019 14:47   Mr Brain Wo Contrast  Result Date: 02/18/2019 CLINICAL DATA:  Initial evaluation for acute altered mental status, dizziness. EXAM: MRI HEAD WITHOUT CONTRAST TECHNIQUE: Multiplanar, multiecho pulse sequences of the brain and surrounding structures were obtained without intravenous contrast. COMPARISON:  Prior head CT from 02/17/2019. FINDINGS: Brain: Mild age-related cerebral atrophy with chronic small vessel ischemic disease. No abnormal foci of restricted diffusion to suggest acute or subacute ischemia. Gray-white matter differentiation well maintained. No encephalomalacia to suggest chronic infarction. No foci of susceptibility artifact to suggest acute or chronic intracranial hemorrhage. No mass lesion, midline shift or mass effect. No hydrocephalus. No extra-axial fluid collection. Major dural sinuses are grossly patent. Pituitary gland and suprasellar region are normal. Midline structures intact and normal. Vascular: Major intracranial vascular flow voids well maintained and normal in appearance. Skull and upper cervical spine: Craniocervical  junction normal. Visualized upper cervical spine within normal limits. Bone marrow signal intensity normal. No scalp soft tissue abnormality. Sinuses/Orbits: Globes and orbital soft tissues within normal limits. Paranasal sinuses are clear. No mastoid effusion. Inner ear structures normal. Other: None. IMPRESSION: 1. No acute intracranial abnormality. 2. Mild age-related cerebral atrophy with chronic microvascular ischemic disease. Electronically Signed    By: Jeannine Boga M.D.   On: 02/18/2019 01:57   Ct Abdomen Pelvis W Contrast  Result Date: 02/17/2019 CLINICAL DATA:  Left groin pain, nausea, diarrhea. EXAM: CT ABDOMEN AND PELVIS WITH CONTRAST TECHNIQUE: Multidetector CT imaging of the abdomen and pelvis was performed using the standard protocol following bolus administration of intravenous contrast. CONTRAST:  1100m OMNIPAQUE IOHEXOL 300 MG/ML  SOLN COMPARISON:  None. FINDINGS: Lower chest: No acute abnormality. Hepatobiliary: No focal liver abnormality is seen. No gallstones, gallbladder wall thickening, or biliary dilatation. Pancreas: Unremarkable. No pancreatic ductal dilatation or surrounding inflammatory changes. Spleen: Normal in size without focal abnormality. Adrenals/Urinary Tract: Adrenal glands are unremarkable. Kidneys are normal, without renal calculi, focal lesion, or hydronephrosis. Bladder is unremarkable. Stomach/Bowel: Stomach is within normal limits. Appendix appears normal. No evidence of bowel wall thickening, distention, or inflammatory changes. Vascular/Lymphatic: Aortic atherosclerosis. No enlarged abdominal or pelvic lymph nodes. Reproductive: Prostate is unremarkable. Other: No abdominal wall hernia or abnormality. No abdominopelvic ascites. Musculoskeletal: No acute or significant osseous findings. IMPRESSION: No acute abnormality seen in the abdomen or pelvis. Aortic Atherosclerosis (ICD10-I70.0). Electronically Signed   By: JMarijo ConceptionM.D.   On: 02/17/2019 14:54   Dg Chest Portable 1 View  Result Date: 02/17/2019 CLINICAL DATA:  Left lower chest pain since last night with shortness of breath EXAM: PORTABLE CHEST 1 VIEW COMPARISON:  01/01/2018 FINDINGS: Bilateral mild interstitial thickening. No pleural effusion or pneumothorax. Stable cardiomegaly. No aggressive osseous lesion. IMPRESSION: Cardiomegaly with mild pulmonary vascular congestion. Electronically Signed   By: HKathreen Devoid  On: 02/17/2019 13:08        Subjective: Patient seen and examined.  Normal sinus rhythm.  Walking in the hallway and eager to go home.  Wife at the bedside.  No overnight events.  Potassium was 2.9 in the morning.  Replaced.   Discharge Exam: Vitals:   02/18/19 0810 02/18/19 0811  BP: 133/75   Pulse:  65  Resp:    Temp:    SpO2:     Vitals:   02/18/19 0500 02/18/19 0730 02/18/19 0810 02/18/19 0811  BP:  133/75 133/75   Pulse:  63  65  Resp: 15 16    Temp:  98.1 F (36.7 C)    TempSrc:  Oral    SpO2:  98%    Weight: 111.7 kg     Height:        General: Pt is alert, awake, not in acute distress, on room air.  Walking in the hallway. Cardiovascular: RRR, S1/S2 +, no rubs, no gallops Respiratory: CTA bilaterally, no wheezing, no rhonchi Abdominal: Soft, NT, ND, bowel sounds + Extremities: no edema, no cyanosis    The results of significant diagnostics from this hospitalization (including imaging, microbiology, ancillary and laboratory) are listed below for reference.     Microbiology: Recent Results (from the past 240 hour(s))  SARS Coronavirus 2     Status: None   Collection Time: 02/17/19  3:11 PM  Result Value Ref Range Status   SARS Coronavirus 2 NOT DETECTED NOT DETECTED Final    Comment: (NOTE) SARS-CoV-2 target nucleic acids are  NOT DETECTED. The SARS-CoV-2 RNA is generally detectable in upper and lower respiratory specimens during the acute phase of infection.  Negative  results do not preclude SARS-CoV-2 infection, do not rule out co-infections with other pathogens, and should not be used as the sole basis for treatment or other patient management decisions.  Negative results must be combined with clinical observations, patient history, and epidemiological information. The expected result is Not Detected. Fact Sheet for Patients: http://www.biofiredefense.com/wp-content/uploads/2020/03/BIOFIRE-COVID -19-patients.pdf Fact Sheet for Healthcare  Providers: http://www.biofiredefense.com/wp-content/uploads/2020/03/BIOFIRE-COVID -19-hcp.pdf This test is not yet approved or cleared by the [place] and  has been authorized for detection and/or diagnosis of SARS-CoV-2 by FDA under an Emergency Use Authorization (EUA).  This EUA will remain in effec t (meaning this test can be used) for the duration of  the COVID-19 declaration under Section 564(b)(1) of the Act, 21 U.S.C. section 360bbb-3(b)(1), unless the authorization is terminated or revoked sooner. Performed at [facility], [place] [address], [place], [place] [address]      Labs: BNP (last 3 results) Recent Labs    02/17/19 1550 02/17/19 1907  BNP 112.0* 299.2*   Basic Metabolic Panel: Recent Labs  Lab 02/17/19 1252 02/17/19 2236 02/18/19 0553  NA 138  --  138  K 3.1*  --  2.9*  CL 97*  --  97*  CO2 24  --  29  GLUCOSE 152*  --  118*  BUN <5*  --  <5*  CREATININE 1.04  --  0.90  CALCIUM 8.8*  --  8.6*  MG  --  1.5*  --    Liver Function Tests: Recent Labs  Lab 02/17/19 1252  AST 47*  ALT 22  ALKPHOS 81  BILITOT 1.3*  PROT 6.1*  ALBUMIN 3.5   Recent Labs  Lab 02/17/19 1300  LIPASE 23   No results for input(s): AMMONIA in the last 168 hours. CBC: Recent Labs  Lab 02/17/19 1252 02/18/19 0553  WBC 8.2 7.0  NEUTROABS 7.4  --   HGB 15.4 13.5  HCT 43.2 38.1*  MCV 98.4 98.2  PLT 248 186   Cardiac Enzymes: Recent Labs  Lab 02/17/19 1907 02/17/19 2236 02/18/19 0553  TROPONINI <0.03 <0.03 <0.03   BNP: Invalid input(s): POCBNP CBG: Recent Labs  Lab 02/17/19 2112 02/18/19 0633  GLUCAP 150* 117*   D-Dimer No results for input(s): DDIMER in the last 72 hours. Hgb A1c Recent Labs    02/17/19 1907  HGBA1C 5.8*   Lipid Profile Recent Labs    02/18/19 0553  CHOL 188  HDL 44  LDLCALC 111*  TRIG 164*  CHOLHDL 4.3   Thyroid function studies Recent Labs    02/17/19 1907  TSH 8.627*   Anemia work up No results  for input(s): VITAMINB12, FOLATE, FERRITIN, TIBC, IRON, RETICCTPCT in the last 72 hours. Urinalysis    Component Value Date/Time   COLORURINE YELLOW 02/17/2019 1507   APPEARANCEUR CLEAR 02/17/2019 1507   LABSPEC 1.016 02/17/2019 1507   PHURINE 8.0 02/17/2019 1507   GLUCOSEU NEGATIVE 02/17/2019 1507   HGBUR NEGATIVE 02/17/2019 1507   BILIRUBINUR NEGATIVE 02/17/2019 1507   BILIRUBINUR negative 01/03/2018 0959   KETONESUR 5 (A) 02/17/2019 1507   PROTEINUR 30 (A) 02/17/2019 1507   UROBILINOGEN 0.2 01/03/2018 0959   NITRITE NEGATIVE 02/17/2019 1507   LEUKOCYTESUR NEGATIVE 02/17/2019 1507   Sepsis Labs Invalid input(s): PROCALCITONIN,  WBC,  LACTICIDVEN Microbiology Recent Results (from the past 240 hour(s))  SARS Coronavirus 2  Status: None   Collection Time: 02/17/19  3:11 PM  Result Value Ref Range Status   SARS Coronavirus 2 NOT DETECTED NOT DETECTED Final    Comment: (NOTE) SARS-CoV-2 target nucleic acids are NOT DETECTED. The SARS-CoV-2 RNA is generally detectable in upper and lower respiratory specimens during the acute phase of infection.  Negative  results do not preclude SARS-CoV-2 infection, do not rule out co-infections with other pathogens, and should not be used as the sole basis for treatment or other patient management decisions.  Negative results must be combined with clinical observations, patient history, and epidemiological information. The expected result is Not Detected. Fact Sheet for Patients: http://www.biofiredefense.com/wp-content/uploads/2020/03/BIOFIRE-COVID -19-patients.pdf Fact Sheet for Healthcare Providers: http://www.biofiredefense.com/wp-content/uploads/2020/03/BIOFIRE-COVID -19-hcp.pdf This test is not yet approved or cleared by the [place] and  has been authorized for detection and/or diagnosis of SARS-CoV-2 by FDA under an Emergency Use Authorization (EUA).  This EUA will remain in effec t (meaning this test can be used) for  the duration of  the COVID-19 declaration under Section 564(b)(1) of the Act, 21 U.S.C. section 360bbb-3(b)(1), unless the authorization is terminated or revoked sooner. Performed at [facility], [place] [address], [place], [place] [address]      Time coordinating discharge: 25 minutes  SIGNED:   Barb Merino, MD  [facility] 02/18/2019, 2:06 PM Pager [phone]  If 7PM-7AM, please contact night-coverage [email or web address] Password [email or web address]

## 2019-02-18 NOTE — Progress Notes (Signed)
02/18/2019 2:48 PM Discharge AVS meds taken today and those due this evening reviewed.  Follow-up appointments and when to call md reviewed.  D/C IV and TELE.  Questions and concerns addressed.   D/C home per orders. [place]

## 2019-02-18 NOTE — Progress Notes (Signed)
Progress Note  Patient Name: [name] HIGASHI Sr. Date of Encounter: 02/18/2019  Primary Cardiologist: Shelva Majestic, MD   Subjective   He feels better today.   Inpatient Medications    Scheduled Meds:  amLODipine  5 mg Oral Daily   apixaban  5 mg Oral BID   azaTHIOprine  200 mg Oral BH-q7a   dicyclomine  20 mg Oral TID AC & HS   finasteride  5 mg Oral Daily   FLUoxetine  20 mg Oral TID   insulin aspart  0-15 Units Subcutaneous TID WC   loratadine  10 mg Oral Daily   LORazepam  1 mg Oral TID   metoprolol tartrate  25 mg Oral BID   multivitamin with minerals  1 tablet Oral Daily   pantoprazole  20 mg Oral Daily   potassium chloride  20 mEq Oral BID   QUEtiapine  100 mg Oral QHS   Continuous Infusions:  PRN Meds: acetaminophen, loperamide, ondansetron (ZOFRAN) IV   Vital Signs    Vitals:   02/18/19 0500 02/18/19 0730 02/18/19 0810 02/18/19 0811  BP:  133/75 133/75   Pulse:  63  65  Resp: 15 16    Temp:  98.1 F (36.7 C)    TempSrc:  Oral    SpO2:  98%    Weight: 111.7 kg     Height:        Intake/Output Summary (Last 24 hours) at 02/18/2019 1113 Last data filed at 02/17/2019 2012 Gross per 24 hour  Intake 700 ml  Output --  Net 700 ml   Last 3 Weights 02/18/2019 02/17/2019 02/17/2019  Weight (lbs) 246 lb 3.2 oz 246 lb 6.4 oz 250 lb  Weight (kg) 111.676 kg 111.766 kg 113.399 kg      Telemetry    SR, 60' - Personally Reviewed  ECG    SR - Personally Reviewed  Physical Exam   GEN: No acute distress.  Obese. Neck: No JVD Cardiac: RRR, no murmurs, rubs, or gallops.  Respiratory: Clear to auscultation bilaterally. GI: Soft, nontender, non-distended  MS: No edema; No deformity. Neuro:  Nonfocal  Psych: Normal affect   Labs    Chemistry Recent Labs  Lab 02/17/19 1252 02/18/19 0553  NA 138 138  K 3.1* 2.9*  CL 97* 97*  CO2 24 29  GLUCOSE 152* 118*  BUN <5* <5*  CREATININE 1.04 0.90  CALCIUM 8.8* 8.6*  PROT 6.1*  --   ALBUMIN  3.5  --   AST 47*  --   ALT 22  --   ALKPHOS 81  --   BILITOT 1.3*  --   GFRNONAA >60 >60  GFRAA >60 >60  ANIONGAP 17* 12     Hematology Recent Labs  Lab 02/17/19 1252 02/18/19 0553  WBC 8.2 7.0  RBC 4.39 3.88*  HGB 15.4 13.5  HCT 43.2 38.1*  MCV 98.4 98.2  MCH 35.1* 34.8*  MCHC 35.6 35.4  RDW 14.9 15.5  PLT 248 186    Cardiac Enzymes Recent Labs  Lab 02/17/19 1907 02/17/19 2236 02/18/19 0553  TROPONINI <0.03 <0.03 <0.03    Recent Labs  Lab 02/17/19 1259  TROPIPOC 0.02     BNP Recent Labs  Lab 02/17/19 1550 02/17/19 1907  BNP 112.0* 170.5*     DDimer No results for input(s): DDIMER in the last 168 hours.   Radiology    Ct Head Wo Contrast  Result Date: 02/17/2019 CLINICAL DATA:  Pain following fall EXAM: CT  HEAD WITHOUT CONTRAST TECHNIQUE: Contiguous axial images were obtained from the base of the skull through the vertex without intravenous contrast. COMPARISON:  July 07, 2018 FINDINGS: Brain: There is mild diffuse atrophy. There is no intracranial mass, hemorrhage, extra-axial fluid collection, or midline shift. Brain parenchyma appears unremarkable. There is no demonstrable acute infarct. Vascular: There is no appreciable hyperdense vessel. There is calcification in each carotid siphon region. Skull: Bony calvarium appears intact. Sinuses/Orbits: There is mucosal thickening in several ethmoid air cells. Other visualized paranasal sinuses are clear. Orbits appear symmetric bilaterally. Other: Mastoid air cells are clear. IMPRESSION: Mild diffuse atrophy. Brain parenchyma appears unremarkable. No mass or hemorrhage. There are foci of carotid artery calcification. There is mucosal thickening in several ethmoid air cells. Electronically Signed   By: Lowella Grip III M.D.   On: 02/17/2019 14:47   Mr Brain Wo Contrast  Result Date: 02/18/2019 CLINICAL DATA:  Initial evaluation for acute altered mental status, dizziness. EXAM: MRI HEAD WITHOUT CONTRAST  TECHNIQUE: Multiplanar, multiecho pulse sequences of the brain and surrounding structures were obtained without intravenous contrast. COMPARISON:  Prior head CT from 02/17/2019. FINDINGS: Brain: Mild age-related cerebral atrophy with chronic small vessel ischemic disease. No abnormal foci of restricted diffusion to suggest acute or subacute ischemia. Gray-white matter differentiation well maintained. No encephalomalacia to suggest chronic infarction. No foci of susceptibility artifact to suggest acute or chronic intracranial hemorrhage. No mass lesion, midline shift or mass effect. No hydrocephalus. No extra-axial fluid collection. Major dural sinuses are grossly patent. Pituitary gland and suprasellar region are normal. Midline structures intact and normal. Vascular: Major intracranial vascular flow voids well maintained and normal in appearance. Skull and upper cervical spine: Craniocervical junction normal. Visualized upper cervical spine within normal limits. Bone marrow signal intensity normal. No scalp soft tissue abnormality. Sinuses/Orbits: Globes and orbital soft tissues within normal limits. Paranasal sinuses are clear. No mastoid effusion. Inner ear structures normal. Other: None. IMPRESSION: 1. No acute intracranial abnormality. 2. Mild age-related cerebral atrophy with chronic microvascular ischemic disease. Electronically Signed   By: Jeannine Boga M.D.   On: 02/18/2019 01:57   Ct Abdomen Pelvis W Contrast  Result Date: 02/17/2019 CLINICAL DATA:  Left groin pain, nausea, diarrhea. EXAM: CT ABDOMEN AND PELVIS WITH CONTRAST TECHNIQUE: Multidetector CT imaging of the abdomen and pelvis was performed using the standard protocol following bolus administration of intravenous contrast. CONTRAST:  111m OMNIPAQUE IOHEXOL 300 MG/ML  SOLN COMPARISON:  None. FINDINGS: Lower chest: No acute abnormality. Hepatobiliary: No focal liver abnormality is seen. No gallstones, gallbladder wall thickening, or  biliary dilatation. Pancreas: Unremarkable. No pancreatic ductal dilatation or surrounding inflammatory changes. Spleen: Normal in size without focal abnormality. Adrenals/Urinary Tract: Adrenal glands are unremarkable. Kidneys are normal, without renal calculi, focal lesion, or hydronephrosis. Bladder is unremarkable. Stomach/Bowel: Stomach is within normal limits. Appendix appears normal. No evidence of bowel wall thickening, distention, or inflammatory changes. Vascular/Lymphatic: Aortic atherosclerosis. No enlarged abdominal or pelvic lymph nodes. Reproductive: Prostate is unremarkable. Other: No abdominal wall hernia or abnormality. No abdominopelvic ascites. Musculoskeletal: No acute or significant osseous findings. IMPRESSION: No acute abnormality seen in the abdomen or pelvis. Aortic Atherosclerosis (ICD10-I70.0). Electronically Signed   By: JMarijo ConceptionM.D.   On: 02/17/2019 14:54   Dg Chest Portable 1 View  Result Date: 02/17/2019 CLINICAL DATA:  Left lower chest pain since last night with shortness of breath EXAM: PORTABLE CHEST 1 VIEW COMPARISON:  01/01/2018 FINDINGS: Bilateral mild interstitial thickening. No pleural effusion or pneumothorax.  Stable cardiomegaly. No aggressive osseous lesion. IMPRESSION: Cardiomegaly with mild pulmonary vascular congestion. Electronically Signed   By: Kathreen Devoid   On: 02/17/2019 13:08   Cardiac Studies   Echo 02/18/2019  1. The left ventricle has hyperdynamic systolic function, with an ejection fraction of >65%. The cavity size was normal. There is moderate concentric left ventricular hypertrophy. Left ventricular diastolic Doppler parameters are consistent with  pseudonormalization. Elevated left atrial and left ventricular end-diastolic pressures.  2. The right ventricle has normal systolic function. The cavity was normal. There is no increase in right ventricular wall thickness.  3. Left atrial size was moderately dilated.  4. The mitral valve is  degenerative. Mild thickening of the mitral valve leaflet. Mild calcification of the mitral valve leaflet. There is mild mitral annular calcification present.    Patient Profile     68 y.o. male with new onset atrial fibrillation  Assessment & Plan    1. Paroxysmal Atrial fibrillation RVR - appears to be new onset - risk factors include obesity and OSA not compliant on CPAP - he spontaneously cardioverted overnight to SR - TSH 8.6, I would send additional labs for fT3,4 and start levothyroxine 25 mcg daily - Mg is low - we will replace - continue lopressor  - start eliquis for anticoagulation 5 mg PO BID, CHADS-VASc 3  2. Hypokalemia - replace  3. OSA on CPAP - not compliant - not compliant, per wife - may need a new mask  4. Mild pulmonary congestion on CXR - check an echo - may need some light diuresis given Afib  5. Hypertensive heart disease with CHF - with moderate concentric LVH and grade 2 diastolic dysfunction - I will start spironolactone 25 mg po daily  6. Obesity  - encourage weight loss  [facility] will sign off.   Medication Recommendations:  As above Other recommendations (labs, testing, etc):  No further testing Follow up as an outpatient:  We will arrange in 1 week  For questions or updates, please contact [place] Please consult [email or web address] for contact info under        Signed, Ena Dawley, MD  02/18/2019, 11:13 AM

## 2019-02-19 ENCOUNTER — Encounter: Payer: Self-pay | Admitting: Neurology

## 2019-02-19 ENCOUNTER — Encounter: Payer: Self-pay | Admitting: Family Medicine

## 2019-02-19 DIAGNOSIS — R9431 Abnormal electrocardiogram [ECG] [EKG]: Secondary | ICD-10-CM | POA: Insufficient documentation

## 2019-02-19 LAB — T3, FREE: T3, Free: 3.5 pg/mL (ref 2.0–4.4)

## 2019-02-20 ENCOUNTER — Telehealth: Payer: Self-pay

## 2019-02-20 NOTE — Telephone Encounter (Signed)
LM requesting call back to complete TCM and schedule hospital follow up.   

## 2019-02-23 ENCOUNTER — Telehealth: Payer: Self-pay

## 2019-02-23 NOTE — Telephone Encounter (Signed)
I called pt, he wants to come for an in office appt, discussed COVID policies and procedures. Pt verbalized understanding.

## 2019-02-24 ENCOUNTER — Encounter: Payer: Self-pay | Admitting: Neurology

## 2019-02-24 ENCOUNTER — Ambulatory Visit (INDEPENDENT_AMBULATORY_CARE_PROVIDER_SITE_OTHER): Payer: Medicare HMO | Admitting: Neurology

## 2019-02-24 ENCOUNTER — Other Ambulatory Visit: Payer: Self-pay

## 2019-02-24 VITALS — BP 122/79 | HR 87 | Ht 68.0 in | Wt 251.0 lb

## 2019-02-24 DIAGNOSIS — G4733 Obstructive sleep apnea (adult) (pediatric): Secondary | ICD-10-CM | POA: Diagnosis not present

## 2019-02-24 DIAGNOSIS — I4891 Unspecified atrial fibrillation: Secondary | ICD-10-CM | POA: Diagnosis not present

## 2019-02-24 DIAGNOSIS — R413 Other amnesia: Secondary | ICD-10-CM | POA: Diagnosis not present

## 2019-02-24 NOTE — Progress Notes (Signed)
Subjective:    [name] ID: [name] Flesher Sr. is a 68 y.o. male.  HPI     Interim history:   [name] is a 68 year old right-handed gentleman with an underlying medical history of anxiety, depression, ulcerative colitis, memory loss, low back pain, hypertension, Bell's palsy, diabetes, and obesity, who presents for reevaluation of [name] obstructive sleep apnea.  [name] is accompanied by [name] wife today.  I last saw him on 04/24/2018, at which time we talked about [name] sleep study results.  He was not quite compliant with [name] CPAP.  Unfortunately, he lost coverage for [name] CPAP machine in [name] interim due to not meeting compliance criteria.    Today, 02/24/2019: He reports That he had difficulty being comfortable with [name] CPAP and he tried different masks.  He would be willing to get retested and restart CPAP therapy.  He was recently hospitalized.  [name] wife reports that he was diagnosed with A. fib.  I reviewed [name] hospital records.  He was admitted on 02/17/2019 and discharged on 02/18/2019.  He was treated with medical management.  He has been on a blood thinner, Eliquis, and has a follow-up with [name] cardiologist.  He was encouraged to restart CPAP therapy at [name] time.  He has difficulty with sleep.  He has had memory loss.  He has not seen Dr. Jaynee Eagles again since last year.  Generally he is in bed around 830 and likes to read.  He sleeps typically from 930 or 1030 onwards but does wake up in [name] early morning hours.  He has difficulty going back to sleep and when [name] wife wakes up around 4 he is ready to go back to bed.  He does drink quite a bit of caffeine in [name] form of sweet tea about 2 cups/day and diet soda and small bottles up to 8 or 10/day.   [name]'s allergies, current medications, family history, past medical history, past social history, past surgical history and problem list were reviewed and updated as appropriate.    Previously:      I first met him on 09/18/2017 at [name] request of  Dr. Jaynee Eagles, at which time he reported significant daytime somnolence. [name] wife reported that he had pauses in [name] breathing and snoring. He was advised to proceed with a sleep study. He had a baseline sleep study on 09/30/2017, followed by a CPAP titration study. I went over [name] test results with him in detail today. Sleep study from 09/30/2017 showed a sleep latency of 11.5 minutes, REM sleep was absent. He had absence of slow-wave sleep. He had markedly increased percentages of light stage sleep only. [name] total AHI was 20.3 per hour. Average oxygen saturation was 94%, nadir was 77%. He had no significant PLMS. Based on [name] test results I advised him to proceed with a full night CPAP titration study. He had this on 01/13/2018. Sleep latency was 61 minutes, REM latency was 111 minutes. Sleep efficiency was 78.3%. He was fitted with nasal pillows and titrated on CPAP from 5 cm to 9 cm. On [name] final pressure [name] AHI was 0 per hour with brief supine REM sleep achieved an O2 nadir of 89%. He had no significant PLMS. I advised him to start CPAP therapy at home at a pressure of 10 cm.   I reviewed [name] CPAP compliance data from 03/24/2018 through 04/22/2018 which is a total of 30 days, during which time he used [name] CPAP only 19 days with percent used days  greater than 4 hours at 23%, indicating suboptimal compliance, average usage of 3 hours and 40 minutes only, residual AHI borderline at 5.1 per hour, leak on [name] high side, pressure at 10 cm with EPR of 3. In [name] month of early June through early July [name] past 30 day compliance was 40% for usage of over 4 hours.   09/18/2017: (He) reports excessive daytime somnolence. [name] Epworth sleepiness score is 18 out of 24, fatigue score is 25 out of 63. He does not believe he has dozed off at [name] wheel. [name] wife has reported to him that he snores and that he also has pauses in [name] breathing. She has sleep apnea and uses a CPAP machine. He is reluctant to consider coming in for a  sleep study but would be willing if [name] insurance covers him for this and he would be willing to consider CPAP therapy if [name] need arises. He has occasional restless legs symptoms, no nights night nocturia, no frequent morning headaches but has had rare morning headaches. He had a tonsillectomy as a child. Bedtime is around 11, rise time between 6 and 7. He has [name] own company, they install outdoor gas appliances. I reviewed your office note from 09/17/2017. He lives with [name] wife. He quit smoking in 1980 and does not utilize alcohol on a regular basis but does drink significant caffeine on a day-to-day basis including 6-7 bottles of soda, 3-5 glasses of iced tea, is not sure how much water he drinks.   [name] Past Medical History Is Significant For: Past Medical History:  Diagnosis Date  . Adenomatous colon polyp 2016  . Allergy   . Anxiety   . Bell palsy    resolved  . Chicken pox   . Depression   . Diet-controlled diabetes mellitus ([place])   . Eating disorder   . GERD (gastroesophageal reflux disease)   . History of vitamin D deficiency   . Hyperlipidemia   . Hypertension   . Hypokalemia   . Memory loss   . MI (myocardial infarction) ([place])    per pt   . Ulcerative colitis ([place]) 2005-2006   severe     [name] Past Surgical History Is Significant For: Past Surgical History:  Procedure Laterality Date  . COLONOSCOPY WITH PROPOFOL N/A 07/15/2014   Procedure: COLONOSCOPY WITH PROPOFOL;  Surgeon: [name], MD;  Location: [facility];  Service: Endoscopy;  Laterality: N/A;  . COLONOSCOPY WITH PROPOFOL N/A 08/22/2015   Procedure: COLONOSCOPY WITH PROPOFOL;  Surgeon: [name], MD;  Location: [facility];  Service: Endoscopy;  Laterality: N/A;  . ORIF ANKLE FRACTURE  02/27/2012   Procedure: OPEN REDUCTION INTERNAL FIXATION (ORIF) ANKLE FRACTURE;  Surgeon: Colin Rhein, MD;  Location: [facility];  Service: Orthopedics;  Laterality: Left;  ORIF left bimalleolar ankle  fracture  . SHOULDER SURGERY     LEFT  . SIGMOIDOSCOPY  10/08/2018   Externa and Internal Hmorrhoids. Tubular and Tublovillous adenoma removed from transverse colon. Inflammatory pseudopolyps, negative for dysplasia  . TONSILLECTOMY    . UPPER GASTROINTESTINAL ENDOSCOPY      [name] Family History Is Significant For: Family History  Problem Relation Age of Onset  . Dementia Mother   . Hypertension Mother   . Early death Father 59       drowning   . Post-traumatic stress disorder Brother   . Neuropathy Brother     [name] Social History Is Significant For: Social History   Socioeconomic History  .  Marital status: Married    Spouse name: [name]  . Number of children: 2  . Years of education: Not on file  . Highest education level: Some college, no degree  Occupational History  . Not on file  Social Needs  . Financial resource strain: Not on file  . Food insecurity    Worry: Not on file    Inability: Not on file  . Transportation needs    Medical: Not on file    Non-medical: Not on file  Tobacco Use  . Smoking status: Former Smoker    Packs/day: 1.00    Years: 19.00    Pack years: 19.00    Quit date: 02/25/1982    Years since quitting: 37.0  . Smokeless tobacco: Former Systems developer    Quit date: 1990  Substance and Sexual Activity  . Alcohol use: Yes    Comment: occ  . Drug use: No  . Sexual activity: Yes    Partners: Female  Lifestyle  . Physical activity    Days per week: Not on file    Minutes per session: Not on file  . Stress: Not on file  Relationships  . Social Herbalist on phone: Not on file    Gets together: Not on file    Attends religious service: Not on file    Active member of club or organization: Not on file    Attends meetings of clubs or organizations: Not on file    Relationship status: Not on file  Other Topics Concern  . Not on file  Social History Narrative   Lives at home with wife [name].   College-educated. Works in Engineer, civil (consulting).     [name] Allergies Are:  Allergies  Allergen Reactions  . Losartan Potassium-Hctz Diarrhea  . Zoloft [Sertraline Hcl] Diarrhea  :   [name] Current Medications Are:  Outpatient Encounter Medications as of 02/24/2019  Medication Sig  . amLODipine (NORVASC) 10 MG tablet Take 0.5 tablets (5 mg total) by mouth daily.  Marland Kitchen apixaban (ELIQUIS) 5 MG TABS tablet Take 1 tablet (5 mg total) by mouth 2 (two) times daily.  Marland Kitchen azaTHIOprine (IMURAN) 50 MG tablet Take 200 mg by mouth every morning.   . dicyclomine (BENTYL) 20 MG tablet Take 20 mg by mouth 4 (four) times daily -  before meals and at bedtime.  . finasteride (PROSCAR) 5 MG tablet Take 5 mg by mouth daily.  Marland Kitchen FLUoxetine (PROZAC) 20 MG capsule Take 3 capsules (60 mg total) by mouth daily. ([name] taking differently: Take 20 mg by mouth 3 (three) times daily. )  . fluticasone (FLONASE) 50 MCG/ACT nasal spray Place 2 sprays into both nostrils daily. ([name] taking differently: Place 2 sprays into both nostrils daily as needed for allergies or rhinitis. )  . KLOR-CON M10 10 MEQ tablet TAKE 1 TABLET BY MOUTH EVERY DAY ([name] taking differently: Take 10 mEq by mouth daily. )  . levocetirizine (XYZAL) 5 MG tablet TAKE 1/2 TABLET BY MOUTH EVERY EVENING  . levothyroxine (SYNTHROID) 25 MCG tablet Take 1 tablet (25 mcg total) by mouth daily before breakfast for 30 days.  Marland Kitchen loperamide (ANTI-DIARRHEAL) 2 MG tablet Take 1 tablet (2 mg total) by mouth daily as needed. ([name] taking differently: Take 2 mg by mouth daily as needed for diarrhea or loose stools. )  . LORazepam (ATIVAN) 1 MG tablet Take 1 tablet (1 mg total) by mouth 3 (three) times daily.  . metoprolol tartrate (LOPRESSOR) 25  MG tablet Take 1 tablet (25 mg total) by mouth 2 (two) times daily for 30 days.  . Multiple Vitamin (MULTIVITAMIN WITH MINERALS) TABS tablet Take 1 tablet by mouth daily.  . pantoprazole (PROTONIX) 20 MG tablet Take 1 tablet (20 mg total) by mouth daily.   . QUEtiapine (SEROQUEL) 100 MG tablet Take 1 tablet (100 mg total) by mouth at bedtime.  Marland Kitchen spironolactone (ALDACTONE) 25 MG tablet Take 1 tablet (25 mg total) by mouth daily for 30 days.   No facility-administered encounter medications on file as of 02/24/2019.   :  Review of Systems:  Out of a complete 14 point review of systems, all are reviewed and negative with [name] exception of these symptoms as listed below: Review of Systems  Neurological:       Pt presents today to discuss [name] cpap. Pt reports that he could never find a good mask fit.    Objective:  Neurological Exam  Physical Exam Physical Examination:   Vitals:   02/24/19 1501  BP: 122/79  Pulse: 87    General Examination: [name] is a very pleasant 68 y.o. male in no acute distress. He appears well-developed and well-nourished and well groomed.   HEENT:Normocephalic, atraumatic, pupils are equal, round and reactive to light and accommodation. Extraocular tracking is good without limitation to gaze excursion or nystagmus noted. Normal smooth pursuit is noted. Hearing is grossly intact. Face is symmetric with normal facial animation and normal facial sensation. Speech is clear with no dysarthria noted. There is no hypophonia. There is no lip, neck/head, jaw or voice tremor. Neck with FROM. Oropharynx exam reveals: mildmouth dryness, adequatedental hygiene with dentures on top, moderate airway crowding, tonsils are absent. Tongue protrudes centrally and palate elevates symmetrically. Neck circumference is 19-1/4 inches.  Chest:Clear to auscultation without wheezing, rhonchi or crackles noted.  Heart:S1+S2+0, regular and normal without murmurs, rubs or gallops noted.   Abdomen:Soft, non-tender and non-distended with normal bowel sounds appreciated on auscultation.  Extremities:There isnopitting edema in [name] distal lower extremities bilaterally.   Skin: Warm and dry without trophic changes  noted.  Musculoskeletal: exam reveals no obvious joint deformities, tenderness or joint swelling or erythema.   Neurologically:  Mental status: [name] is awake, alert and oriented in all 4 spheres.He is able to provide [name] own history. wife provides details.  Cranial nerves II - XII are as described above under HEENT exam. In addition: shoulder shrug is normal with equal shoulder height noted. Motor exam: Normal bulk, strength and tone is noted. There is no tremor . Fine motor skills and coordination:grossly intact. Cerebellar testing: No dysmetria or intention tremor. There is no truncal or gait ataxia.  Sensory exam: intact to light touch in [name] upper and lower extremities.  Gait, station and balance:Hestands without difficulty,posture is age-appropriate. Gait is unremarkable.  Assessmentand Plan:  In summary,[name] a very pleasant 79 year oldmalewith an underlying medical history of anxiety, depression, ulcerative colitis, memory loss, low back pain, hypertension, Bell's palsy, diabetes, morbid obesity and new onset a fib, who presents for re-evaluation of [name] obstructive sleep apnea. Treatment for [name] moderate obstructive sleep apnea is important.  We had a longer discussion again about this today.  He had a difficult time finding a comfortable mask.  He would be willing to restart [name] process with another sleep study and intends to be compliant with treatment.  He is commended for this. [name] baseline sleep study from January 2019 indicated moderate obstructive sleep apnea but likely  was an underestimation of [name] obstructive sleep disordered breathing due to absence of REM sleep and poor sleep consolidation, reduced sleep efficiency. He did fairly well with CPAP therapy during [name] titration study in May 2019. He has started CPAP therapy in June but has Not used [name] machine essentially since March.  He was recently this month diagnosed with new onset A. fib.  From [name]  cardiac standpoint it would be important to treat [name] sleep apnea. He is also advised to follow up with Dr. Jaynee Eagles after we have been able to restart CPAP therapy.  He has a history of memory loss but has not been able to pursue neuropsychological testing as I understand. I plan to see him back after sleep testing.  I answered all their questions today and [name] and [name] wife are in agreement.  I spent 25 minutes in total face-to-face time with [name], more than 50% of which was spent in counseling and coordination of care, reviewing test results, reviewing medication and discussing or reviewing [name] diagnosis of OSA, its prognosis and treatment options. Pertinent laboratory and imaging test results that were available during this visit with [name] were reviewed by me and considered in my medical decision making (see chart for details).

## 2019-02-24 NOTE — Patient Instructions (Signed)
We will Reinitiate evaluation for your sleep apnea with a sleep study. We will call you to schedule your sleep test.

## 2019-02-26 ENCOUNTER — Telehealth: Payer: Self-pay

## 2019-02-26 NOTE — Telephone Encounter (Signed)
Called Patient to discuss Covid-19 questions but he said he was too sick and wanted to me call back tomorrow.

## 2019-03-04 ENCOUNTER — Encounter: Payer: Self-pay | Admitting: Cardiology

## 2019-03-04 ENCOUNTER — Ambulatory Visit (INDEPENDENT_AMBULATORY_CARE_PROVIDER_SITE_OTHER): Payer: Medicare HMO | Admitting: Medical

## 2019-03-04 ENCOUNTER — Other Ambulatory Visit: Payer: Self-pay

## 2019-03-04 VITALS — BP 116/70 | HR 75 | Temp 98.4°F | Ht 68.0 in | Wt 246.8 lb

## 2019-03-04 DIAGNOSIS — I5032 Chronic diastolic (congestive) heart failure: Secondary | ICD-10-CM

## 2019-03-04 DIAGNOSIS — G4733 Obstructive sleep apnea (adult) (pediatric): Secondary | ICD-10-CM

## 2019-03-04 DIAGNOSIS — R9431 Abnormal electrocardiogram [ECG] [EKG]: Secondary | ICD-10-CM | POA: Diagnosis not present

## 2019-03-04 DIAGNOSIS — E785 Hyperlipidemia, unspecified: Secondary | ICD-10-CM | POA: Diagnosis not present

## 2019-03-04 DIAGNOSIS — I4891 Unspecified atrial fibrillation: Secondary | ICD-10-CM

## 2019-03-04 DIAGNOSIS — E039 Hypothyroidism, unspecified: Secondary | ICD-10-CM | POA: Diagnosis not present

## 2019-03-04 DIAGNOSIS — E119 Type 2 diabetes mellitus without complications: Secondary | ICD-10-CM | POA: Diagnosis not present

## 2019-03-04 DIAGNOSIS — I10 Essential (primary) hypertension: Secondary | ICD-10-CM | POA: Diagnosis not present

## 2019-03-04 LAB — BASIC METABOLIC PANEL
BUN/Creatinine Ratio: 4 — ABNORMAL LOW (ref 10–24)
BUN: 4 mg/dL — ABNORMAL LOW (ref 8–27)
CO2: 27 mmol/L (ref 20–29)
Calcium: 9.9 mg/dL (ref 8.6–10.2)
Chloride: 98 mmol/L (ref 96–106)
Creatinine, Ser: 1 mg/dL (ref 0.76–1.27)
GFR calc Af Amer: 88 mL/min/{1.73_m2} (ref >59)
GFR calc non Af Amer: 76 mL/min/{1.73_m2} (ref >59)
Glucose: 100 mg/dL — ABNORMAL HIGH (ref 65–99)
Potassium: 4.2 mmol/L (ref 3.5–5.2)
Sodium: 139 mmol/L (ref 134–144)

## 2019-03-04 MED ORDER — ATORVASTATIN CALCIUM 20 MG PO TABS: 20.0000 mg | ORAL_TABLET | Freq: Every day | ORAL | 1 refills | Status: DC

## 2019-03-04 NOTE — Progress Notes (Signed)
Cardiology Office Note   Date:  03/04/2019   ID:  [name] Flesher Sr., DOB 14-Nov-1950, MRN [id number]  PCP:  [name], DO  Cardiologist:  Shelva Majestic, MD EP: None  Chief Complaint  Patient presents with  . Hospitalization Follow-up    Atrial fibrillation      History of Present Illness: [name] GAIR Sr. is a 68 y.o. male with PMH of HTN, HLD, recent diagnosis of atrial fibrillation, chronic diastolic CHF, DM type 2, UC, OSA, anxiety, depression, benzodiazepine dependence, and memory loss who presents for post-hospital follow-up of recently diagnosed atrial fibrillation  He was admitted to the hospital from 02/17/2019-02/18/2019 after developing AMS. He was found to be in atrial fibrillation with RVR on arrival. Echo 02/18/2019 showed EF >65%, moderate concentric LVH, G2DD, elevated LA/LV end-diastolic pressures, and mild MAC. He was started metoprolol tartrate for rate control and eliquis 15m BID for stroke ppx given CHA2DS2-VASc Score of 3 (HTN, DM, age 602-74. He converted to sinus rhythm prior to discharge. Hospital course was complicated by mild pulmonary vascular congestion and he was started on spironolactone. Additionally his TSH was elevated and he was started on levothyroxine.  He presents today for post-hospital follow-up of his atrial fibrillation. His wife accompanies him today as the patient has some memory troubles. He reports doing well from a heart perspective since discharge. No complaints of chest pain, SOB, palpitations, orthopnea, PND, dizziness, lightheadedness, or syncope. He reports chronic LE edema which is improved since discharge. Weight is down 11lbs since discharge. He does report an affinity for salt and sodas (sometimes 12 cans per day). We had a long discussion on the importance limiting salt and fluid intake when it comes to diastolic CHF. He has a history of ulcerative colitis but denies any hematochezia or melena. No complaints of hematuria.     Past  Medical History:  Diagnosis Date  . Adenomatous colon polyp 2016  . Allergy   . Anxiety   . Bell palsy    resolved  . Chicken pox   . Depression   . Diet-controlled diabetes mellitus ([facility]   . Eating disorder   . GERD (gastroesophageal reflux disease)   . History of vitamin D deficiency   . Hyperlipidemia   . Hypertension   . Hypokalemia   . Memory loss   . [place]    per pt   . [place]ative [place] ([facility] 2005[place]   severe     Past Surgical History:  Procedure Laterality Date  . COLONOSCOPY WITH PROPOFOL N/A 07/15/2014   Procedure: COLONOSCOPY WITH PROPOFOL;  Surgeon: MGarlan Fair MD;  Location: [facility];  Service: Endoscopy;  Laterality: N/A;  . COLONOSCOPY WITH PROPOFOL N/A 08/22/2015   Procedure: COLONOSCOPY WITH PROPOFOL;  Surgeon: MGarlan Fair MD;  Location: [facility];  Service: Endoscopy;  Laterality: N/A;  . ORIF ANKLE FRACTURE  02/27/2012   Procedure: OPEN REDUCTION INTERNAL FIXATION (ORIF) ANKLE FRACTURE;  Surgeon: PColin Rhein MD;  [facility]  Service: Orthopedics;  Laterality: Left;  ORIF left bimalleolar ankle fracture  . SHOULDER SURGERY     LEFT  . SIGMOIDOSCOPY  10/08/2018   Externa and Internal Hmorrhoids. Tubular and Tublovillous adenoma removed from transverse colon. Inflammatory pseudopolyps, negative for dysplasia  . TONSILLECTOMY    . UPPER GASTROINTESTINAL ENDOSCOPY       Current Outpatient Medications  Medication Sig Dispense Refill  . amLODipine (NORVASC) 10 MG tablet Take 0.5 tablets (  5 mg total) by mouth daily. 90 tablet 1  . apixaban (ELIQUIS) 5 MG TABS tablet Take 1 tablet (5 mg total) by mouth 2 (two) times daily. 60 tablet 5  . azaTHIOprine (IMURAN) 50 MG tablet Take 200 mg by mouth every morning.     . dicyclomine (BENTYL) 20 MG tablet Take 20 mg by mouth 4 (four) times daily -  before meals and at bedtime.    . finasteride (PROSCAR) 5 MG tablet Take 5 mg by mouth daily.     Marland Kitchen FLUoxetine (PROZAC) 20 MG capsule Take 3 capsules (60 mg total) by mouth daily. (Patient taking differently: Take 20 mg by mouth 3 (three) times daily. ) 270 capsule 1  . fluticasone (FLONASE) 50 MCG/ACT nasal spray Place 2 sprays into both nostrils daily. (Patient taking differently: Place 2 sprays into both nostrils daily as needed for allergies or rhinitis. ) 16 g 6  . KLOR-CON M10 10 MEQ tablet TAKE 1 TABLET BY MOUTH EVERY DAY (Patient taking differently: Take 10 mEq by mouth daily. ) 90 tablet 1  . levocetirizine (XYZAL) 5 MG tablet TAKE 1/2 TABLET BY MOUTH EVERY EVENING 45 tablet 1  . levothyroxine (SYNTHROID) 25 MCG tablet Take 1 tablet (25 mcg total) by mouth daily before breakfast for 30 days. 30 tablet 5  . loperamide (ANTI-DIARRHEAL) 2 MG tablet Take 1 tablet (2 mg total) by mouth daily as needed. (Patient taking differently: Take 2 mg by mouth daily as needed for diarrhea or loose stools. ) 90 tablet 1  . LORazepam (ATIVAN) 1 MG tablet Take 1 tablet (1 mg total) by mouth 3 (three) times daily. 90 tablet 5  . metoprolol tartrate (LOPRESSOR) 25 MG tablet Take 1 tablet (25 mg total) by mouth 2 (two) times daily for 30 days. 60 tablet 5  . Multiple Vitamin (MULTIVITAMIN WITH MINERALS) TABS tablet Take 1 tablet by mouth daily.    . pantoprazole (PROTONIX) 20 MG tablet Take 1 tablet (20 mg total) by mouth daily. 90 tablet 3  . QUEtiapine (SEROQUEL) 100 MG tablet Take 1 tablet (100 mg total) by mouth at bedtime. 90 tablet 1  . spironolactone (ALDACTONE) 25 MG tablet Take 1 tablet (25 mg total) by mouth daily for 30 days. 30 tablet 5  . atorvastatin (LIPITOR) 20 MG tablet Take 1 tablet (20 mg total) by mouth daily. 90 tablet 1   No current facility-administered medications for this visit.     Allergies:   Losartan potassium-hctz and Zoloft [sertraline hcl]    Social History:  The patient  reports that he quit smoking about 37 years ago. He has a 19.00 pack-year smoking history. He quit  smokeless tobacco use about 30 years ago. He reports current alcohol use. He reports that he does not use drugs.   Family History:  The patient's family history includes Dementia in his mother; Early death (age of onset: 41) in his father; Hypertension in his mother; Neuropathy in his brother; Post-traumatic stress disorder in his brother.    ROS:  Please see the history of present illness.   Otherwise, review of systems are positive for none.   All other systems are reviewed and negative.    PHYSICAL EXAM: VS:  BP 116/70   Pulse 75   Temp 98.4 F (36.9 C)   Ht _0  (1.727 m)   Wt 246 lb 12.8 oz (111.9 kg)   SpO2 96%   BMI 37.53 kg/m  , BMI Body mass index is 37.53 kg/m.  GEN: Well nourished, well developed, in no acute distress HEENT: sclera anicteric Neck: no JVD, carotid bruits, or masses Cardiac: RRR; no murmurs, rubs, or gallops,no edema  Respiratory:  clear to auscultation bilaterally, normal work of breathing GI: soft, nontender, nondistended, + BS MS: no deformity or atrophy Skin: warm and dry, no rash Neuro:  Strength and sensation are intact Psych: tangential    EKG:  EKG is ordered today. The ekg ordered today demonstrates sinus rhythm with rate 75 bpm, non-specific IVCD, no STE/D, no TWI, QTC 495 today (improved from 530 02/18/2019)   Recent Labs: 02/17/2019: ALT 22; B Natriuretic Peptide 170.5; Magnesium 1.5; TSH 8.627 02/18/2019: BUN <5; Creatinine, Ser 0.90; Hemoglobin 13.5; Platelets 186; Potassium 2.9; Sodium 138    Lipid Panel    Component Value Date/Time   CHOL 188 02/18/2019 0553   TRIG 164 (H) 02/18/2019 0553   HDL 44 02/18/2019 0553   CHOLHDL 4.3 02/18/2019 0553   VLDL 33 02/18/2019 0553   LDLCALC 111 (H) 02/18/2019 0553      Wt Readings from Last 3 Encounters:  03/04/19 246 lb 12.8 oz (111.9 kg)  02/24/19 251 lb (113.9 kg)  02/18/19 246 lb 3.2 oz (111.7 kg)      Other studies Reviewed: Additional studies/ records that were reviewed today  include:   Echocardiogram 02/2019: IMPRESSIONS    1. The left ventricle has hyperdynamic systolic function, with an ejection fraction of >65%. The cavity size was normal. There is moderate concentric left ventricular hypertrophy. Left ventricular diastolic Doppler parameters are consistent with  pseudonormalization. Elevated left atrial and left ventricular end-diastolic pressures.  2. The right ventricle has normal systolic function. The cavity was normal. There is no increase in right ventricular wall thickness.  3. Left atrial size was moderately dilated.  4. The mitral valve is degenerative. Mild thickening of the mitral valve leaflet. Mild calcification of the mitral valve leaflet. There is mild mitral annular calcification present.    ASSESSMENT AND PLAN:  1. Paroxysmal atrial fibrillation: In sinus rhythm on EKG today. No complaints of palpitations or bleeding with eliquis. He has not had an ischemic evaluation in several years and does not recall the results of prior stress test.  - Will check a NST given risk factors for CAD including HTN, HLD, DM, obesity, and OSA.  - Continue eliquis 80m BID for stroke ppx given CHA2DS2-VASc Score of 3 (HTN, DM, age 70-74 - Continue metoprolol tartrate 271mBID for rate control. Can take once daily prn metoprolol for HR persistently >120 bpm for >1 hour.   2. Chronic diastolic CHF: he was started on spironolactone during recent admission for volume management with improvement in LE edema. Weight is down 11lbs from 02/18/2019.  - Continue metoprolol and spironolactone - Continue to encourage low sodium diet and limit fluid intake to 2L per day  3. HTN: noted to have moderate concentric LVH on echo 02/18/2019. He was started on metoprolol tartrate and spironolactone, in addition to amlodipine. BP well controlled today - Continue amlodipine, metoprolol, and spironolactone  4. OSA: followed by Dr. AtRexene AlbertsSeen in office 02/24/2019 and was  non-compliant with CPAP. Recommended to resume at that time given new diagnosis of atrial fibrillation. He is planned for repeat sleep testing - Continue management per Dr. AtRexene Alberts5. HLD: LDL 111 02/18/2019. Not on a statin.  - Will start atorvastatin 2045maily for risk management.   6. Hypothyroidism: TSH elevated, Free T4 was low normal. He was started on levothyroxine  53mg daily - Will defer to PCP for ongoing management.   7. DM type 2: A1C 5.8 02/17/2019. Diet controlled - Continue close monitoring of blood sugars  8. Prolonged QT: On seroquel and fluoxitine. No EKGs in our system from before patient was started on these medications.  - Avoid additional QT prolonging medications - Will defer to PCP to consider alternative medications that will control his depression/anxiety without prolonging QT.   9. Obesity: BMI 37. He has lost 11lbs since 02/18/2019.  - Continue to encourage healthy dietary and lifestyle modifications to promote weight loss   Current medicines are reviewed at length with the patient today.  The patient does not have concerns regarding medicines.  The following changes have been made:  Will start atorvastatin 272mdaily.   Labs/ tests ordered today include:   Orders Placed This Encounter  Procedures  . Basic metabolic panel  . MYOCARDIAL PERFUSION IMAGING  . EKG 12-Lead     Disposition:   FU with Dr. KeClaiborne Billingsn 4 months  Signed, KrAbigail ButtsPA-C  03/04/2019 11:56 AM

## 2019-03-04 NOTE — Patient Instructions (Signed)
Medication Instructions:   START Atorvastatin 20 mg daily  Take an extra 1/2 tablet of metoprolol if your heart rate goes above 120 for 1 hour or more.  If you need a refill on your cardiac medications before your next appointment, please call your pharmacy.   Lab work: Your physician recommends that you return for lab work today: Bmet  If you have labs (blood work) drawn today and your tests are completely normal, you will receive your results only by: Marland Kitchen MyChart Message (if you have MyChart) OR . A paper copy in the mail If you have any lab test that is abnormal or we need to change your treatment, we will call you to review the results.  Testing/Procedures: Your physician has requested that you have a lexiscan myoview. For further information please visit [email or web address]. Please follow instruction sheet, as given.  Follow-Up: At [facility], you and your health needs are our priority.  As part of our continuing mission to provide you with exceptional heart care, we have created designated Provider Care Teams.  These Care Teams include your primary Cardiologist (physician) and Advanced Practice Providers (APPs -  Physician Assistants and Nurse Practitioners) who all work together to provide you with the care you need, when you need it. . Please follow-up with your primary care provider to monitor your thyroid function and discuss alternatives to seroquel and prozac. Marland Kitchen Please schedule a follow-up appointment with Dr. Claiborne Billings in 4 months.  Any Other Special Instructions Will Be Listed Below (If Applicable). Your physician recommends that you weigh, daily, at the same time every day, and in the same amount of clothing. Please record your daily weights on the handout provided and bring it to your next appointment. Please call our office for weight gain of greater than 2 lbs in one day or greater than 5 lbs in 1 week.   Low-Sodium Eating Plan Sodium, which is an element that makes up  salt, helps you maintain a healthy balance of fluids in your body. Too much sodium can increase your blood pressure and cause fluid and waste to be held in your body. Your health care provider or dietitian may recommend following this plan if you have high blood pressure (hypertension), kidney disease, liver disease, or heart failure. Eating less sodium can help lower your blood pressure, reduce swelling, and protect your heart, liver, and kidneys. What are tips for following this plan? General guidelines  Most people on this plan should limit their sodium intake to 1,500-2,000 mg (milligrams) of sodium each day. Reading food labels   The Nutrition Facts label lists the amount of sodium in one serving of the food. If you eat more than one serving, you must multiply the listed amount of sodium by the number of servings.  Choose foods with less than 140 mg of sodium per serving.  Avoid foods with 300 mg of sodium or more per serving. Shopping  Look for lower-sodium products, often labeled as "low-sodium" or "no salt added."  Always check the sodium content even if foods are labeled as "unsalted" or "no salt added".  Buy fresh foods. ? Avoid canned foods and premade or frozen meals. ? Avoid canned, cured, or processed meats  Buy breads that have less than 80 mg of sodium per slice. Cooking  Eat more home-cooked food and less restaurant, buffet, and fast food.  Avoid adding salt when cooking. Use salt-free seasonings or herbs instead of table salt or sea salt. Check with your health care  provider or pharmacist before using salt substitutes.  Cook with plant-based oils, such as canola, sunflower, or olive oil. Meal planning  When eating at a restaurant, ask that your food be prepared with less salt or no salt, if possible.  Avoid foods that contain MSG (monosodium glutamate). MSG is sometimes added to [place] food, bouillon, and some canned foods. What foods are recommended? The  items listed may not be a complete list. Talk with your dietitian about what dietary choices are best for you. Grains Low-sodium cereals, including oats, puffed wheat and rice, and shredded wheat. Low-sodium crackers. Unsalted rice. Unsalted pasta. Low-sodium bread. Whole-grain breads and whole-grain pasta. Vegetables Fresh or frozen vegetables. "No salt added" canned vegetables. "No salt added" tomato sauce and paste. Low-sodium or reduced-sodium tomato and vegetable juice. Fruits Fresh, frozen, or canned fruit. Fruit juice. Meats and other protein foods Fresh or frozen (no salt added) meat, poultry, seafood, and fish. Low-sodium canned tuna and salmon. Unsalted nuts. Dried peas, beans, and lentils without added salt. Unsalted canned beans. Eggs. Unsalted nut butters. Dairy Milk. Soy milk. Cheese that is naturally low in sodium, such as ricotta cheese, fresh mozzarella, or Swiss cheese Low-sodium or reduced-sodium cheese. Cream cheese. Yogurt. Fats and oils Unsalted butter. Unsalted margarine with no trans fat. Vegetable oils such as canola or olive oils. Seasonings and other foods Fresh and dried herbs and spices. Salt-free seasonings. Low-sodium mustard and ketchup. Sodium-free salad dressing. Sodium-free light mayonnaise. Fresh or refrigerated horseradish. Lemon juice. Vinegar. Homemade, reduced-sodium, or low-sodium soups. Unsalted popcorn and pretzels. Low-salt or salt-free chips. What foods are not recommended? The items listed may not be a complete list. Talk with your dietitian about what dietary choices are best for you. Grains Instant hot cereals. Bread stuffing, pancake, and biscuit mixes. Croutons. Seasoned rice or pasta mixes. Noodle soup cups. Boxed or frozen macaroni and cheese. Regular salted crackers. Self-rising flour. Vegetables Sauerkraut, pickled vegetables, and relishes. Olives. [place] fries. Onion rings. Regular canned vegetables (not low-sodium or reduced-sodium). Regular  canned tomato sauce and paste (not low-sodium or reduced-sodium). Regular tomato and vegetable juice (not low-sodium or reduced-sodium). Frozen vegetables in sauces. Meats and other protein foods Meat or fish that is salted, canned, smoked, spiced, or pickled. Bacon, ham, sausage, hotdogs, corned beef, chipped beef, packaged lunch meats, salt pork, jerky, pickled herring, anchovies, regular canned tuna, sardines, salted nuts. Dairy Processed cheese and cheese spreads. Cheese curds. Blue cheese. Feta cheese. String cheese. Regular cottage cheese. Buttermilk. Canned milk. Fats and oils Salted butter. Regular margarine. Ghee. Bacon fat. Seasonings and other foods Onion salt, garlic salt, seasoned salt, table salt, and sea salt. Canned and packaged gravies. Worcestershire sauce. Tartar sauce. Barbecue sauce. Teriyaki sauce. Soy sauce, including reduced-sodium. Steak sauce. Fish sauce. Oyster sauce. Cocktail sauce. Horseradish that you find on the shelf. Regular ketchup and mustard. Meat flavorings and tenderizers. Bouillon cubes. Hot sauce and Tabasco sauce. Premade or packaged marinades. Premade or packaged taco seasonings. Relishes. Regular salad dressings. Salsa. Potato and tortilla chips. Corn chips and puffs. Salted popcorn and pretzels. Canned or dried soups. Pizza. Frozen entrees and pot pies. Summary  Eating less sodium can help lower your blood pressure, reduce swelling, and protect your heart, liver, and kidneys.  Most people on this plan should limit their sodium intake to 1,500-2,000 mg (milligrams) of sodium each day.  Canned, boxed, and frozen foods are high in sodium. Restaurant foods, fast foods, and pizza are also very high in sodium. You also get sodium by  adding salt to food.  Try to cook at home, eat more fresh fruits and vegetables, and eat less fast food, canned, processed, or prepared foods. This information is not intended to replace advice given to you by your health care  provider. Make sure you discuss any questions you have with your health care provider. Document Released: 02/16/2002 Document Revised: 08/20/2016 Document Reviewed: 08/20/2016 Elsevier Interactive Patient Education  2019 [employer].

## 2019-03-05 NOTE — Progress Notes (Signed)
The patient has been notified of the result and verbalized understanding.  All questions (if any) were answered. [name], [place] 03/05/2019 11:24 AM

## 2019-03-20 ENCOUNTER — Ambulatory Visit (HOSPITAL_COMMUNITY)
Admission: RE | Admit: 2019-03-20 | Payer: Medicare HMO | Source: Ambulatory Visit | Attending: Medical | Admitting: Medical

## 2019-03-23 ENCOUNTER — Ambulatory Visit (INDEPENDENT_AMBULATORY_CARE_PROVIDER_SITE_OTHER): Payer: Medicare HMO | Admitting: Neurology

## 2019-03-23 ENCOUNTER — Other Ambulatory Visit: Payer: Self-pay

## 2019-03-23 DIAGNOSIS — R413 Other amnesia: Secondary | ICD-10-CM

## 2019-03-23 DIAGNOSIS — G4733 Obstructive sleep apnea (adult) (pediatric): Secondary | ICD-10-CM

## 2019-03-23 DIAGNOSIS — Z789 Other specified health status: Secondary | ICD-10-CM

## 2019-03-23 DIAGNOSIS — I4891 Unspecified atrial fibrillation: Secondary | ICD-10-CM

## 2019-03-27 ENCOUNTER — Encounter: Payer: Self-pay | Admitting: Neurology

## 2019-04-01 ENCOUNTER — Telehealth (HOSPITAL_COMMUNITY): Payer: Self-pay

## 2019-04-01 NOTE — Telephone Encounter (Signed)
Encounter complete. 

## 2019-04-03 ENCOUNTER — Encounter: Payer: Self-pay | Admitting: Cardiology

## 2019-04-03 ENCOUNTER — Ambulatory Visit (HOSPITAL_COMMUNITY): Payer: Medicare HMO

## 2019-04-08 ENCOUNTER — Telehealth: Payer: Self-pay

## 2019-04-08 NOTE — Addendum Note (Signed)
Addended by: Star Age on: 04/08/2019 07:58 AM   Modules accepted: Orders

## 2019-04-08 NOTE — Procedures (Signed)
Patient Information     First Name: [name] Name: [name] ID: [id number]  Birth Date: 12-21-1950 [name]: 68 Gender: Male  Referring Provider: Howard Pouch A, DO BMI: 38.1 (W=251 lb, H=5' 8'')  Neck Circ.:  19 Epworth:   Sleep Study Information    Study Date: Mar 23, 2019 S/H/[name] Version: 001.001.001.001 / 4.1.1528 / 49  History:     68 year old man with [name] history of anxiety, depression, ulcerative colitis, memory loss, low back pain, hypertension, Bell's palsy, diabetes, obesity and new onset atrial fibrillation, who presents for re-evaluation of his OSA.   Summary & Diagnosis:     Severe OSA Recommendations:      This home sleep test demonstrates severe obstructive sleep apnea with [name] total AHI of 54.6/hour and O2 nadir of 70%. Treatment with positive airway pressure (PAP) - in the form of CPAP - is recommended. This will require, ideally, [name] full night CPAP titration study for proper treatment settings, O2 monitoring and mask fitting. Based on the severity of the sleep disordered breathing, an attended titration study is indicated. In light of his prior difficulty with CPAP, [name] proper titration study would allow for better treatment settings and mask fitting. Please note, that untreated obstructive sleep apnea may carry additional perioperative morbidity. Patients with significant obstructive sleep apnea should receive perioperative PAP therapy and the surgeons and particularly the anesthesiologist should be informed of the diagnosis and the severity of the sleep disordered breathing. Patient will be reminded regarding compliance with the PAP machine and to be mindful of cleanliness with the equipment and timely with supply changes (i.e. changing filter, mask, hose, humidifier chamber on an ongoing basis, as recommended, and cleaning parts that touch the face and nose daily, etc). The patient should be cautioned not to drive, work at heights, or operate dangerous or heavy equipment when tired or  sleepy. Review and reiteration of good sleep hygiene measures should be pursued with any patient. Other causes of the patient's symptoms, including circadian rhythm disturbances, an underlying mood disorder, medication effect and/or an underlying medical problem cannot be ruled out based on this test. Clinical correlation is recommended. The patient and his referring provider will be notified of the test results. The patient will be seen in follow up in sleep clinic at [facility], either for [name] face-to-face or virtual visit, whichever feasible and recommended at the time.  I certify that I have reviewed the raw data recording prior to the issuance of this report in accordance with the standards of the American Academy of Sleep Medicine (AASM).  [name] Age, MD, PhD [facility]) Diplomat, ABPN (Neurology and Sleep)           Sleep Summary    Oxygen Saturation Statistics     Start Study Time: End Study Time: Total Recording Time:  10:18:46 PM   6:21:28 AM   8 h, 2 min  Total Sleep Time % REM of Sleep Time:  6 h, 18 min  10.4    Mean: 92 Minimum: 70 Maximum: 98  Mean of Desaturations Nadirs (%):   89  Oxygen Desaturation. %:   4-9 10-20 >20 Total  Events Number Total   190  14 92.7 6.8  1 0.5  205 100.0  Oxygen Saturation: <90 <=88 <85 <80 <70  Duration (minutes): Sleep % 29.3 7.7  9.6 1.1  2.5 0.3 0.6 0.2 0.0 0.0     Respiratory Indices      Total Events REM  NREM All Night  pRDI:  337  pAHI:  337 ODI:  205  pAHIc:  43  % CSR: 0.0 62.3 62.3 40.5 4.7 53.7 53.7 32.3 7.2 54.6 54.6 33.2 7.0       Pulse Rate Statistics during Sleep (BPM)      Mean: 66 Minimum: 49 Maximum: 101    Indices are calculated using technically valid sleep time of  6 hrs, 10 min. pRDI/pAHI are calculated using oxi desaturations ? 3%  Body Position Statistics  Position Supine Prone Right Left Non-Supine  Sleep (min) 239.2 0.0 11.0 128.5 139.5  Sleep % 63.2  0.0 2.9 33.9 36.8  pRDI 60.7 N/[name] 60.4 42.6 44.0  pAHI 60.7 N/[name] 60.4 42.6 44.0  ODI 36.1 N/[name] 32.9 27.7 28.2     Snoring Statistics Snoring Level (dB) >40 >50 >60 >70 >80 >Threshold (45)  Sleep (min) 285.5 140.4 37.2 0.0 0.0 183.1  Sleep % 75.4 37.1 9.8 0.0 0.0 48.3    Mean: 48 dB Sleep Stages Chart                                                                             pAHI=54.6                                                                                               Mild              Moderate                    Severe                                                    5              15                    30

## 2019-04-08 NOTE — Progress Notes (Signed)
Patient presented for re-eval of OSA, seen by me on 02/24/19, HST on 03/23/19:  Please call and notify the patient that the recent home sleep confirmed severe obstructive sleep apnea and that I recommend treatment for this in the form of CPAP. I will request an overnight sleep study for proper titration and mask fitting, esp in light of prior difficulty with CPAP. Please explain to patient.   Star Age, MD, PhD [facility])

## 2019-04-08 NOTE — Telephone Encounter (Signed)
I called pt. I advised pt that Dr. Rexene Alberts reviewed their sleep study results and found that pt has severe osa and recommends that pt be treated with a cpap. Dr. Rexene Alberts recommends that pt return for a repeat sleep study in order to properly titrate the cpap and ensure a good mask fit. Pt is agreeable to returning for a titration study. I advised pt that our sleep lab will file with pt's insurance and call pt to schedule the sleep study when we hear back from the pt's insurance regarding coverage of this sleep study. Pt is reluctant to start cpap again but is agreeable to proceeding with this titration study. Pt verbalized understanding of results. Pt had no questions at this time but was encouraged to call back if questions arise.

## 2019-04-08 NOTE — Telephone Encounter (Signed)
-----   Message from Star Age, MD sent at 04/08/2019  7:58 AM EDT ----- Patient presented for re-eval of OSA, seen by me on 02/24/19, HST on 03/23/19:  Please call and notify the patient that the recent home sleep confirmed severe obstructive sleep apnea and that I recommend treatment for this in the form of CPAP. I will request an overnight sleep study for proper titration and mask fitting, esp in light of prior difficulty with CPAP. Please explain to patient.   Star Age, MD, PhD [facility])

## 2019-04-10 DIAGNOSIS — M1711 Unilateral primary osteoarthritis, right knee: Secondary | ICD-10-CM | POA: Diagnosis not present

## 2019-04-13 ENCOUNTER — Telehealth: Payer: Self-pay | Admitting: Family Medicine

## 2019-04-13 NOTE — Telephone Encounter (Signed)
Patient needs a refill of KLOR-CON M10 10 MEQ tablet sent to [facility]

## 2019-04-13 NOTE — Telephone Encounter (Signed)
Pts wife was called and told there was one refill left on patients medication. Wife was advised to call and speak with pharmacy as it is showing on their online account there is a refill left. She will call back if unable to get medication refilled

## 2019-04-15 ENCOUNTER — Telehealth (HOSPITAL_COMMUNITY): Payer: Self-pay

## 2019-04-15 NOTE — Telephone Encounter (Signed)
Encounter complete. 

## 2019-04-17 ENCOUNTER — Ambulatory Visit (HOSPITAL_COMMUNITY)
Admission: RE | Admit: 2019-04-17 | Payer: Medicare HMO | Source: Ambulatory Visit | Attending: Medical | Admitting: Medical

## 2019-05-05 ENCOUNTER — Telehealth (HOSPITAL_COMMUNITY): Payer: Self-pay

## 2019-05-05 NOTE — Telephone Encounter (Signed)
Encounter complete. 

## 2019-05-08 ENCOUNTER — Ambulatory Visit (HOSPITAL_COMMUNITY)
Admission: RE | Admit: 2019-05-08 | Discharge: 2019-05-08 | Disposition: A | Discharge status: Home / Self Care | Payer: Medicare HMO | Source: Ambulatory Visit | Attending: Cardiovascular Disease | Admitting: Cardiovascular Disease

## 2019-05-08 ENCOUNTER — Other Ambulatory Visit: Payer: Self-pay

## 2019-05-08 DIAGNOSIS — I10 Essential (primary) hypertension: Secondary | ICD-10-CM | POA: Insufficient documentation

## 2019-05-08 DIAGNOSIS — I4891 Unspecified atrial fibrillation: Secondary | ICD-10-CM | POA: Diagnosis not present

## 2019-05-08 LAB — MYOCARDIAL PERFUSION IMAGING
LV dias vol: 118 mL (ref 62–150)
LV sys vol: 54 mL
Peak HR: 75 {beats}/min
Rest HR: 65 {beats}/min
SDS: 4
SRS: 0
SSS: 4
TID: 1

## 2019-05-08 MED ORDER — TECHNETIUM TC 99M TETROFOSMIN IV KIT
30.3000 | PACK | Freq: Once | INTRAVENOUS | Status: AC | PRN
  Administered 2019-05-08: 30.3 via INTRAVENOUS
  Filled 2019-05-08: qty 31

## 2019-05-08 MED ORDER — TECHNETIUM TC 99M TETROFOSMIN IV KIT
10.2000 | PACK | Freq: Once | INTRAVENOUS | Status: AC | PRN
  Administered 2019-05-08: 10.2 via INTRAVENOUS
  Filled 2019-05-08: qty 11

## 2019-05-08 MED ORDER — REGADENOSON 0.4 MG/5ML IV SOLN
0.4000 mg | Freq: Once | INTRAVENOUS | Status: AC
  Administered 2019-05-08: 0.4 mg via INTRAVENOUS

## 2019-05-11 ENCOUNTER — Telehealth: Payer: Self-pay

## 2019-05-11 ENCOUNTER — Telehealth: Payer: Self-pay | Admitting: Cardiovascular Disease

## 2019-05-11 NOTE — Telephone Encounter (Addendum)
Left a detailed message for the patient with the results of his Lexi/Myo with the comments from Roby Lofts, PA-C  ----- Message from Abigail Butts, PA-C sent at 05/09/2019  7:56 AM EDT ----- Please notify the patient that his stress test did not reveal any decreased blood flow to his heart. The study reported normal heart squeezing function. He should plan to follow-up with Dr. Claiborne Billings in October. I do not see an appt scheduled so I will route this note to the scheduling office to arrange. Thank you!

## 2019-05-11 NOTE — Telephone Encounter (Signed)
LVM for patient to call and schedule first available appointment with Dr. Claiborne Billings.

## 2019-05-12 ENCOUNTER — Telehealth: Payer: Self-pay | Admitting: Cardiovascular Disease

## 2019-05-12 NOTE — Telephone Encounter (Signed)
LVM for patient to call and schedule followup with Dr. Claiborne Billings.

## 2019-05-19 NOTE — Telephone Encounter (Signed)
Left a message for the patient to call the office to get his results for his Lexiscan/Myoview.

## 2019-05-26 ENCOUNTER — Telehealth: Payer: Self-pay | Admitting: Neurology

## 2019-05-26 ENCOUNTER — Other Ambulatory Visit: Payer: Self-pay

## 2019-05-26 ENCOUNTER — Telehealth: Payer: Self-pay | Admitting: Family Medicine

## 2019-05-26 ENCOUNTER — Encounter: Payer: Self-pay | Admitting: Family Medicine

## 2019-05-26 ENCOUNTER — Ambulatory Visit (INDEPENDENT_AMBULATORY_CARE_PROVIDER_SITE_OTHER): Payer: Medicare HMO | Admitting: Family Medicine

## 2019-05-26 VITALS — BP 122/73 | HR 62 | Temp 97.6°F | Resp 17 | Ht 68.0 in | Wt 251.4 lb

## 2019-05-26 DIAGNOSIS — F339 Major depressive disorder, recurrent, unspecified: Secondary | ICD-10-CM | POA: Diagnosis not present

## 2019-05-26 DIAGNOSIS — I10 Essential (primary) hypertension: Secondary | ICD-10-CM

## 2019-05-26 DIAGNOSIS — Z79899 Other long term (current) drug therapy: Secondary | ICD-10-CM | POA: Insufficient documentation

## 2019-05-26 DIAGNOSIS — F1320 Sedative, hypnotic or anxiolytic dependence, uncomplicated: Secondary | ICD-10-CM

## 2019-05-26 DIAGNOSIS — G4700 Insomnia, unspecified: Secondary | ICD-10-CM

## 2019-05-26 DIAGNOSIS — I4891 Unspecified atrial fibrillation: Secondary | ICD-10-CM

## 2019-05-26 DIAGNOSIS — Z6841 Body Mass Index (BMI) 40.0 and over, adult: Secondary | ICD-10-CM

## 2019-05-26 DIAGNOSIS — R7989 Other specified abnormal findings of blood chemistry: Secondary | ICD-10-CM | POA: Diagnosis not present

## 2019-05-26 DIAGNOSIS — Z7901 Long term (current) use of anticoagulants: Secondary | ICD-10-CM

## 2019-05-26 DIAGNOSIS — F411 Generalized anxiety disorder: Secondary | ICD-10-CM

## 2019-05-26 DIAGNOSIS — G4733 Obstructive sleep apnea (adult) (pediatric): Secondary | ICD-10-CM

## 2019-05-26 DIAGNOSIS — R946 Abnormal results of thyroid function studies: Secondary | ICD-10-CM | POA: Diagnosis not present

## 2019-05-26 DIAGNOSIS — E669 Obesity, unspecified: Secondary | ICD-10-CM | POA: Diagnosis not present

## 2019-05-26 DIAGNOSIS — E876 Hypokalemia: Secondary | ICD-10-CM

## 2019-05-26 DIAGNOSIS — D6869 Other thrombophilia: Secondary | ICD-10-CM | POA: Insufficient documentation

## 2019-05-26 LAB — TSH: TSH: 4.21 u[IU]/mL (ref 0.35–4.50)

## 2019-05-26 LAB — T4, FREE: Free T4: 0.79 ng/dL (ref 0.60–1.60)

## 2019-05-26 MED ORDER — LEVOTHYROXINE SODIUM 25 MCG PO TABS: 25.0000 ug | ORAL_TABLET | Freq: Every day | ORAL | 11 refills | Status: DC

## 2019-05-26 MED ORDER — QUETIAPINE FUMARATE 100 MG PO TABS: 100.0000 mg | ORAL_TABLET | Freq: Every day | ORAL | 1 refills | Status: DC

## 2019-05-26 MED ORDER — LOPERAMIDE HCL 2 MG PO TABS: 2.0000 mg | ORAL_TABLET | Freq: Every day | ORAL | 1 refills | Status: DC | PRN

## 2019-05-26 MED ORDER — POTASSIUM CHLORIDE CRYS ER 10 MEQ PO TBCR: 10.0000 meq | EXTENDED_RELEASE_TABLET | Freq: Every day | ORAL | 1 refills | Status: DC

## 2019-05-26 MED ORDER — FLUOXETINE HCL 20 MG PO CAPS: 60.0000 mg | ORAL_CAPSULE | Freq: Every day | ORAL | 1 refills | Status: DC

## 2019-05-26 MED ORDER — LORAZEPAM 1 MG PO TABS: 1.0000 mg | ORAL_TABLET | Freq: Three times a day (TID) | ORAL | 5 refills | Status: DC

## 2019-05-26 NOTE — Telephone Encounter (Signed)
The pt called and stated that his wife scheduled him for a sleep study. 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 On the night he is scheduled for he has a big meeting he can not miss at this time. The pt wished to cancel his sleep study so he would not be charged a fee for not showing and he will call back to reschedule if the time allows him to. I informed the pt that I have cancel his cpap study and his COVID-19 test for him. Pt verbalized understanding that appts have been canceled.

## 2019-05-26 NOTE — Progress Notes (Signed)
Patient ID: [name] Flesher Sr., male  DOB: 07/06/1951, 68 y.o.   MRN: [id number] Patient Care Team    Relationship Specialty Notifications Start End  [name], DO PCP - General Family Medicine  11/08/17   Troy Sine, MD PCP - Cardiology Cardiology Admissions 02/17/19   [name], MD Consulting Physician Neurology  11/08/17   Clarene Essex, MD Consulting Physician Gastroenterology  11/29/17     Chief Complaint  Patient presents with  . Depression    Pt is doing well with no complaints. Needs refills. Pt thinks he has had the flu shot at another MD office, will ask wife and come back if he has not had it     Subjective:  [name] Flesher Sr. is a 68 y.o.  male present for follow up on chronic medical conditions  Major depression, recurrent, chronic (HCC)/Benzodiazepine dependence (HCC)/Insomnia, unspecified type/anxiety: Pt reports compliance with medications. He has been taking 100 mg of Seroquel QHS and prozac 60 mg QD.He feels he is doing well with depression and anxiety on current regimen.  Prescribed: fluoxetine 60 mg daily.  Lorazepam 1 mg 3 times daily.  Seroquel 100 QHS.  Tried trazodone, which was not effective.   -  Patient is aware of controlled substance policy.  He signed a controlled substance agreement.  Ativan 1 mg 3 times daily scheduled--> no exceptions. - UDS: 02/17/2019 appropriate for Benzo use.  - No etoh.  -  referred to Dr. Mamie Levers 2019. - F/U 6 months.  Date narcotic database last reviewed (include red flags): 05/26/19   Class 3 severe obesity due to excess calories with body mass index (BMI) of 40.0 to 44.9 in adult, unspecified whether serious comorbidity present (HCC)/Essential hypertension/a. Fib/aortic atherosclerosis/hypokalemia Pt reports compliance with amlodipine 10 mg QD, metoprolol 25 mg QD, spirolactone. Patient denies chest pain, shortness of breath, dizziness or lower extremity edema. He is on a statin and eliquis.  BMP: 02/10/2019 WNL  CBC: 02/10/2019 within normal limits Lipid: 02/10/2019 Diet: Does not routinely watch his diet TSH: ELEVATED 02/2019>> started levothyroxine 25 mcg in the hospital- no followup labs Exercise: Does not routinely exercise RF: Hypertension, obesity, hyperlipidemia, diet-controlled diabetes Reviewed June hospitalization  for A.fib with rvr- he has followed with cardio.  - mycardinal perfusion scan- 05/08/2019  The left ventricular ejection fraction is mildly decreased (45-54%).  Nuclear stress EF: 54%.  There was no ST segment deviation noted during stress.  Defect 1: There is a small defect of mild severity present in the apical anterior, apical septal and apex location.  This is a low risk study  Moderate OSA: - has CPAP. Had updated study 03/2019- has appointment to get machine this month.   BMP Latest Ref Rng & Units 03/04/2019 02/18/2019 02/17/2019  Glucose 65 - 99 mg/dL 100(H) 118(H) 152(H)  BUN 8 - 27 mg/dL 4(L) <5(L) <5(L)  Creatinine 0.76 - 1.27 mg/dL 1.00 0.90 1.04  BUN/Creat Ratio 10 - 24 4(L) - -  Sodium 134 - 144 mmol/L 139 138 138  Potassium 3.5 - 5.2 mmol/L 4.2 2.9(L) 3.1(L)  Chloride 96 - 106 mmol/L 98 97(L) 97(L)  CO2 20 - 29 mmol/L 27 29 24   Calcium 8.6 - 10.2 mg/dL 9.9 8.6(L) 8.8(L)   CBC Latest Ref Rng & Units 02/18/2019 02/17/2019 07/07/2018  WBC 4.0 - 10.5 K/uL 7.0 8.2 7.6  Hemoglobin 13.0 - 17.0 g/dL 13.5 15.4 13.8  Hematocrit 39.0 - 52.0 % 38.1(L) 43.2 39.0  Platelets  150 - 400 K/uL 186 248 191    Depression screen [facility] 2/9 05/26/2019 11/17/2018 05/14/2018 11/29/2017 11/08/2017  Decreased Interest 0 0 1 - 1  Down, Depressed, Hopeless 1 1 3 3 2   PHQ - 2 Score 1 1 4 3 3   Altered sleeping 1 1 2 2 1   Tired, decreased energy 1 1 0 2 1  Change in appetite 1 0 0 2 1  Feeling bad or failure about yourself  1 1 1 1 1   Trouble concentrating 1 0 1 1 0  Moving slowly or fidgety/restless 1 0 0 0 1  Suicidal thoughts 0 0 0 - 0  PHQ-9 Score 7 4 8 11 8   Difficult doing work/chores  Somewhat difficult Somewhat difficult Somewhat difficult Very difficult Very difficult   GAD 7 : Generalized Anxiety Score 11/17/2018 11/08/2017  Nervous, Anxious, on Edge 1 2  Control/stop worrying 1 1  Worry too much - different things 1 1  Trouble relaxing 1 2  Restless 1 2  Easily annoyed or irritable 1 2  Afraid - awful might happen 1 1  Total GAD 7 Score 7 11  Anxiety Difficulty Somewhat difficult -       Fall Risk  05/26/2019 04/24/2018 04/07/2018 11/08/2017 11/08/2017  Falls in the past year? 1 Yes Yes Yes Yes  Number falls in past yr: 0 2 or more 2 or more 2 or more 2 or more  Injury with Fall? 0 No No Yes Yes  Comment - - - few scratches few scratches  Risk Factor Category  - High Fall Risk High Fall Risk - -  Risk for fall due to : History of fall(s);Impaired vision;Medication side effect - - - -  Follow up Education provided;Falls evaluation completed;Falls prevention discussed Falls prevention discussed Falls prevention discussed - -   Immunization History  Administered Date(s) Administered  . Influenza, High Dose Seasonal PF 05/14/2018  . Influenza-Unspecified 09/10/2017  . Pneumococcal Conjugate-13 05/14/2018  . Pneumococcal Polysaccharide-23 06/29/2015  . Tdap 03/17/2015   No exam data present  Past Medical History:  Diagnosis Date  . Adenomatous colon polyp 2016  . Allergy   . Anxiety   . Bell palsy    resolved  . Chicken pox   . Depression   . Diet-controlled diabetes mellitus ([place])   . Eating disorder   . GERD (gastroesophageal reflux disease)   . History of vitamin D deficiency   . Hyperlipidemia   . Hypertension   . Hypokalemia   . Memory loss   . MI (myocardial infarction) ([place])    per pt   . Ulcerative colitis ([place]) 2005-2006   severe    Allergies  Allergen Reactions  . Losartan Potassium-Hctz Diarrhea  . Zoloft [Sertraline Hcl] Diarrhea   Past Surgical History:  Procedure Laterality Date  . COLONOSCOPY WITH PROPOFOL N/A 07/15/2014    Procedure: COLONOSCOPY WITH PROPOFOL;  Surgeon: [name], MD;  Location: [facility];  Service: Endoscopy;  Laterality: N/A;  . COLONOSCOPY WITH PROPOFOL N/A 08/22/2015   Procedure: COLONOSCOPY WITH PROPOFOL;  Surgeon: [name], MD;  Location: [facility];  Service: Endoscopy;  Laterality: N/A;  . ORIF ANKLE FRACTURE  02/27/2012   Procedure: OPEN REDUCTION INTERNAL FIXATION (ORIF) ANKLE FRACTURE;  Surgeon: Colin Rhein, MD;  Location: [place];  Service: Orthopedics;  Laterality: Left;  ORIF left bimalleolar ankle fracture  . SHOULDER SURGERY     LEFT  . SIGMOIDOSCOPY  10/08/2018   Externa and  Internal Hmorrhoids. Tubular and Tublovillous adenoma removed from transverse colon. Inflammatory pseudopolyps, negative for dysplasia  . TONSILLECTOMY    . UPPER GASTROINTESTINAL ENDOSCOPY     Family History  Problem Relation Age of Onset  . Dementia Mother   . Hypertension Mother   . Early death Father 14       drowning   . Post-traumatic stress disorder Brother   . Neuropathy Brother    Social History   Socioeconomic History  . Marital status: Married    Spouse name: [name]  . Number of children: 2  . Years of education: Not on file  . Highest education level: Some college, no degree  Occupational History  . Not on file  Social Needs  . Financial resource strain: Not on file  . Food insecurity    Worry: Not on file    Inability: Not on file  . Transportation needs    Medical: Not on file    Non-medical: Not on file  Tobacco Use  . Smoking status: Former Smoker    Packs/day: 1.00    Years: 19.00    Pack years: 19.00    Quit date: 02/25/1982    Years since quitting: 37.2  . Smokeless tobacco: Former Systems developer    Quit date: 1990  Substance and Sexual Activity  . Alcohol use: Yes    Comment: occ  . Drug use: No  . Sexual activity: Yes    Partners: Female  Lifestyle  . Physical activity    Days per week: Not on file    Minutes per session: Not  on file  . Stress: Not on file  Relationships  . Social Herbalist on phone: Not on file    Gets together: Not on file    Attends religious service: Not on file    Active member of club or organization: Not on file    Attends meetings of clubs or organizations: Not on file    Relationship status: Not on file  . Intimate partner violence    Fear of current or ex partner: Not on file    Emotionally abused: Not on file    Physically abused: Not on file    Forced sexual activity: Not on file  Other Topics Concern  . Not on file  Social History Narrative   Lives at home with wife [name].   College-educated. Works in Public house manager.    Allergies as of 05/26/2019      Reactions   Losartan Potassium-hctz Diarrhea   Zoloft [sertraline Hcl] Diarrhea      Medication List       Accurate as of May 26, 2019  8:55 AM. If you have any questions, ask your nurse or doctor.        amLODipine 10 MG tablet Commonly known as: NORVASC Take 0.5 tablets (5 mg total) by mouth daily.   apixaban 5 MG Tabs tablet Commonly known as: ELIQUIS Take 1 tablet (5 mg total) by mouth 2 (two) times daily.   atorvastatin 20 MG tablet Commonly known as: LIPITOR Take 1 tablet (20 mg total) by mouth daily.   azaTHIOprine 50 MG tablet Commonly known as: IMURAN Take 200 mg by mouth every morning.   dicyclomine 20 MG tablet Commonly known as: BENTYL Take 20 mg by mouth 4 (four) times daily -  before meals and at bedtime.   finasteride 5 MG tablet Commonly known as: PROSCAR Take 5 mg by mouth daily.  FLUoxetine 20 MG capsule Commonly known as: PROZAC Take 3 capsules (60 mg total) by mouth daily. What changed:   how much to take  when to take this   fluticasone 50 MCG/ACT nasal spray Commonly known as: FLONASE Place 2 sprays into both nostrils daily. What changed:   when to take this  reasons to take this   levocetirizine 5 MG tablet Commonly known as:  XYZAL TAKE 1/2 TABLET BY MOUTH EVERY EVENING   levothyroxine 25 MCG tablet Commonly known as: Synthroid Take 1 tablet (25 mcg total) by mouth daily before breakfast for 30 days.   loperamide 2 MG tablet Commonly known as: Anti-Diarrheal Take 1 tablet (2 mg total) by mouth daily as needed. What changed: reasons to take this   LORazepam 1 MG tablet Commonly known as: ATIVAN Take 1 tablet (1 mg total) by mouth 3 (three) times daily.   metoprolol tartrate 25 MG tablet Commonly known as: LOPRESSOR Take 1 tablet (25 mg total) by mouth 2 (two) times daily for 30 days. What changed: Another medication with the same name was removed. Continue taking this medication, and follow the directions you see here. Changed by: Howard Pouch, DO   multivitamin with minerals Tabs tablet Take 1 tablet by mouth daily.   pantoprazole 20 MG tablet Commonly known as: PROTONIX Take 1 tablet (20 mg total) by mouth daily.   potassium chloride 10 MEQ tablet Commonly known as: Klor-Con M10 Take 1 tablet (10 mEq total) by mouth daily.   QUEtiapine 100 MG tablet Commonly known as: SEROQUEL Take 1 tablet (100 mg total) by mouth at bedtime.   spironolactone 25 MG tablet Commonly known as: ALDACTONE Take 1 tablet (25 mg total) by mouth daily for 30 days.       All past medical history, surgical history, allergies, family history, immunizations andmedications were updated in the EMR today and reviewed under the history and medication portions of their EMR.    Recent Results (from the past 2160 hour(s))  Basic metabolic panel     Status: Abnormal   Collection Time: 03/04/19 11:58 AM  Result Value Ref Range   Glucose 100 (H) 65 - 99 mg/dL   BUN 4 (L) 8 - 27 mg/dL   Creatinine, Ser 1.00 0.76 - 1.27 mg/dL   GFR calc non Af Amer 76 >59 mL/min/1.73   GFR calc Af Amer 88 >59 mL/min/1.73   BUN/Creatinine Ratio 4 (L) 10 - 24   Sodium 139 134 - 144 mmol/L   Potassium 4.2 3.5 - 5.2 mmol/L   Chloride 98 96 -  106 mmol/L   CO2 27 20 - 29 mmol/L   Calcium 9.9 8.6 - 10.2 mg/dL  MYOCARDIAL PERFUSION IMAGING     Status: None   Collection Time: 05/08/19 12:17 PM  Result Value Ref Range   Rest HR 65 bpm   Rest BP 143/87 mmHg   Peak HR 75 bpm   Peak BP 157/62 mmHg   SSS 4    SRS 0    SDS 4    TID 1.00    LV sys vol 54 mL   LV dias vol 118 62 - 150 mL    ROS: 14 pt review of systems performed and negative (unless mentioned in an HPI)  Objective: BP 122/73 (BP Location: Right Arm, Patient Position: Sitting, Cuff Size: Normal)   Pulse 62   Temp 97.6 F (36.4 C) (Temporal)   Resp 17   Ht 5' 8"  (1.727 m)  Wt 251 lb 6 oz (114 kg)   SpO2 96%   BMI 38.22 kg/m  Gen: Afebrile. No acute distress. Obese caucasian male.  HENT: AT. [place]. MMM. Eyes:Pupils Equal Round Reactive to light, Extraocular movements intact,  Conjunctiva without redness, discharge or icterus. Neck/lymp/endocrine: Supple,no lymphadenopathy, no thyromegaly CV: RRR no murmur, no edema Chest: CTAB, no wheeze or crackles Abd: Soft. obese. NTND. BS present. no Masses palpated.  Neuro:  Normal gait. Oriented x3  Psych: Normal affect, dress and demeanor. Normal speech. Normal thought content and judgment.   No results found for this or any previous visit (from the past 48 hour(s)).  Assessment/plan: [name] Flesher Sr. is a 68 y.o. male present for  major depression/anxiety/insomnia -  Stable.   -  Continue Prozac to 60 mg daily, seroqual 100 qhs, ativan TID.  -  Trazodone did not work well for him.  -  Patient is aware of controlled substance policy.  He signed a controlled substance agreement.  Ativan 1 mg 3 times daily scheduled--> no exceptions. NCCS database reviewed 05/26/19  and appropriate.  - UDS: 02/17/2019 appropriate for Benzo use.  - No etoh.  -  referred to Dr. Mamie Levers 2019. - F/U 6 months.    Essential hypertension/obese: - Stable  - Continue amlodipine 10 mg QD, metoprolol 25 mg BID, spirolactone 25 mg Qd,  Kdur 10. - Low-sodium diet.  Increase exercise. - F/U 6 months  Elevated TSH- Started on levothyroxine 25 mcg started June- no repeat TSH in system. Recheck today tsh/t4 and dose will be adjusted if appropriate and refilled.    Declined flu shot today.   Return in about 5 months (around 11/09/2019).  Note is dictated utilizing voice recognition software. Although note has been proof read prior to signing, occasional typographical errors still can be missed. If any questions arise, please do not hesitate to call for verification.  Electronically signed by: Howard Pouch, DO [place]

## 2019-05-26 NOTE — Telephone Encounter (Signed)
Pt was called and wife was given instructions/results, she verbalized understanding

## 2019-05-26 NOTE — Telephone Encounter (Signed)
Noted, thank you

## 2019-05-26 NOTE — Patient Instructions (Signed)
I have refilled the medication we complete for you today. I will call you with the lab result- we are rechecking your thyroid level since it was abnormal in June during your hospital stay and they started a medication.   Please make sure you get your flu shot by Oct. 1st if you have not already- if you desire to get that here you can call in and get on the nurse schedule for the shot.   Follow up in 6 mos. The front desk will call you to schedule this appt.

## 2019-05-26 NOTE — Telephone Encounter (Signed)
His thyroid is in normal range- continue the thyroid medication at current dose- levothyroxine 25 mcg QD. I have refilled this for him.

## 2019-06-01 DIAGNOSIS — M1712 Unilateral primary osteoarthritis, left knee: Secondary | ICD-10-CM | POA: Diagnosis not present

## 2019-06-01 DIAGNOSIS — M1711 Unilateral primary osteoarthritis, right knee: Secondary | ICD-10-CM | POA: Diagnosis not present

## 2019-06-05 ENCOUNTER — Other Ambulatory Visit (HOSPITAL_COMMUNITY): Payer: Self-pay

## 2019-06-18 DIAGNOSIS — R1111 Vomiting without nausea: Secondary | ICD-10-CM | POA: Diagnosis not present

## 2019-07-13 ENCOUNTER — Other Ambulatory Visit: Payer: Self-pay

## 2019-07-13 MED ORDER — AMLODIPINE BESYLATE 10 MG PO TABS: 5.0000 mg | ORAL_TABLET | Freq: Every day | ORAL | 0 refills | Status: DC

## 2019-07-13 NOTE — Progress Notes (Signed)
Refill request sent from [facility]. Sent #[phone] with 0 refills. Pt will be due in 11/2018 for [facility] appt

## 2019-08-23 ENCOUNTER — Other Ambulatory Visit: Payer: Self-pay | Admitting: Family Medicine

## 2019-08-24 ENCOUNTER — Telehealth: Payer: Self-pay | Admitting: Neurology

## 2019-08-24 NOTE — Telephone Encounter (Signed)
Patient wife called in regards to the patients dementia she states that his dementia is getting worse. Patient was scheduled next available apt for 10/21/2019  Please follow up

## 2019-08-25 ENCOUNTER — Ambulatory Visit: Payer: Medicare HMO | Admitting: Neurology

## 2019-08-25 NOTE — Telephone Encounter (Signed)
I called the wife back and LVM asking for call back.

## 2019-08-25 NOTE — Telephone Encounter (Signed)
When she calls back we can offer a sooner appointment with Jinny Blossom NP or Amy NP. If pt is having a recent change or increased confusion they can call PCP to be sure there is not an infection or other medical reason for this change. For any emergency changes in his condition, call 911.

## 2019-08-26 ENCOUNTER — Encounter: Payer: Self-pay | Admitting: *Deleted

## 2019-08-26 NOTE — Telephone Encounter (Signed)
Called pt's wife once more and LVM offering sooner appt with NP on Monday (cannot guarantee will stay open) and asked for call back if she would like to try to have him seen sooner. I will also send a mychart message.

## 2019-09-10 ENCOUNTER — Ambulatory Visit: Payer: Medicare HMO | Admitting: Physician Assistant

## 2019-09-17 ENCOUNTER — Encounter: Payer: Medicare HMO | Admitting: Physician Assistant

## 2019-09-17 NOTE — Progress Notes (Signed)
Patient is no Show This encounter was created in error - please disregard.

## 2019-10-08 ENCOUNTER — Telehealth: Payer: Medicare HMO | Admitting: Cardiovascular Disease

## 2019-10-13 DIAGNOSIS — M1712 Unilateral primary osteoarthritis, left knee: Secondary | ICD-10-CM | POA: Diagnosis not present

## 2019-10-13 DIAGNOSIS — M170 Bilateral primary osteoarthritis of knee: Secondary | ICD-10-CM | POA: Diagnosis not present

## 2019-10-13 DIAGNOSIS — M1711 Unilateral primary osteoarthritis, right knee: Secondary | ICD-10-CM | POA: Diagnosis not present

## 2019-10-15 ENCOUNTER — Telehealth: Payer: Self-pay | Admitting: Neurology

## 2019-10-15 NOTE — Telephone Encounter (Signed)
I spoke with pt's wife [name] (on [place]) and I discussed with her per Dr. Jaynee Eagles that pt has severe sleep apnea and that alone can cause dementia and also puts patient at risk for heart attack and stroke. He needs to get this treated as patient's memory will only get worse. Once he is compliant on CPAP we can see if he improves and then can go from there. The pt's wife verbalized understanding and stated to cancel the upcoming appt with Dr. Jaynee Eagles. She stated the pt had canceled the study because a COVID test was required. I explained to her why this was needed. She understood and stated she will speak to the patient. I asked for a call back to get the titration study rescheduled. She verbalized appreciation for the call. I expressed my sympathies with her as well.

## 2019-10-15 NOTE — Telephone Encounter (Signed)
Please let me know if patient does not comply with our medical recommendations as we discussed earlier today thanks

## 2019-10-15 NOTE — Telephone Encounter (Signed)
Noted, thanks!

## 2019-10-15 NOTE — Telephone Encounter (Signed)
[name], Patient was diagnosed with severe sleep apnea in July. He has not seen the sleep team since then. He needs follow up with sleep team and compliance on cpap before coming to see me. Would you call him and see if he has been compliant with cpap and reschedule him to Amy for a cpap follow up please and cancel appointment with me? Please place hold on appointment. Thanks

## 2019-10-21 ENCOUNTER — Ambulatory Visit: Payer: Medicare HMO | Admitting: Neurology

## 2019-11-02 DIAGNOSIS — M1711 Unilateral primary osteoarthritis, right knee: Secondary | ICD-10-CM | POA: Diagnosis not present

## 2019-11-16 ENCOUNTER — Other Ambulatory Visit: Payer: Self-pay | Admitting: Family Medicine

## 2019-11-18 ENCOUNTER — Encounter: Payer: Self-pay | Admitting: Physician Assistant

## 2019-11-23 DIAGNOSIS — M1711 Unilateral primary osteoarthritis, right knee: Secondary | ICD-10-CM | POA: Diagnosis not present

## 2019-11-30 DIAGNOSIS — M1711 Unilateral primary osteoarthritis, right knee: Secondary | ICD-10-CM | POA: Diagnosis not present

## 2019-12-03 ENCOUNTER — Other Ambulatory Visit: Payer: Self-pay | Admitting: Family Medicine

## 2019-12-05 ENCOUNTER — Other Ambulatory Visit: Payer: Self-pay | Admitting: Family Medicine

## 2019-12-07 ENCOUNTER — Telehealth: Payer: Self-pay

## 2019-12-07 DIAGNOSIS — M1711 Unilateral primary osteoarthritis, right knee: Secondary | ICD-10-CM | POA: Diagnosis not present

## 2019-12-07 NOTE — Telephone Encounter (Signed)
I can truly sympathize with him and I am sorry for his loss. Unfortunately, I can not refill his ativan for him without seeing him. Doing so would be breaking a Educational psychologist.

## 2019-12-07 NOTE — Telephone Encounter (Signed)
Patient is completely out of LORazepam (ATIVAN) 1 MG tablet and needs it desperately. Can he get enough to make it appt? He goes to [facility]. He is also out of 2 other meds. He is scheduled for 3/31, today's schedule is full & he is going to funeral for brother-in-law tomorrow.

## 2019-12-07 NOTE — Telephone Encounter (Signed)
Pts wife was called and told we could not send in a scheduled medication without a office visit but if he needed the other medications I could send in a short supply.   Wife became angry about medication not being sent and said husband has dementia. She "does not understand why he needed to be seen every 6 months when I only go to the doctor once a year". I explained to wife about scheduled medications and with his health conditions he needed to be seen every 6 months. Pts wife said that her husband stated he was just going to stop taking his meds due to never being able to get refills on them. I told her that would not be a good idea and he should not do that.  I explained to wife again about getting appt q85mhs and that when the bottle said ZERO refills that he should know he needs to be seen before that bottle runs out.   Pts wife could not remember what medications he needed and was out of. She once again asked for the ativan to be sent and I told her this could not be sent

## 2019-12-07 NOTE — Telephone Encounter (Signed)
Pt is unable to move visit up due to death in the family. Please advise.

## 2019-12-09 ENCOUNTER — Encounter: Payer: Self-pay | Admitting: Family Medicine

## 2019-12-09 ENCOUNTER — Ambulatory Visit (INDEPENDENT_AMBULATORY_CARE_PROVIDER_SITE_OTHER): Payer: Medicare HMO | Admitting: Family Medicine

## 2019-12-09 ENCOUNTER — Other Ambulatory Visit: Payer: Self-pay

## 2019-12-09 VITALS — BP 130/86 | HR 89 | Temp 97.8°F | Resp 18 | Ht 68.0 in | Wt 242.1 lb

## 2019-12-09 DIAGNOSIS — G4733 Obstructive sleep apnea (adult) (pediatric): Secondary | ICD-10-CM | POA: Diagnosis not present

## 2019-12-09 DIAGNOSIS — R9431 Abnormal electrocardiogram [ECG] [EKG]: Secondary | ICD-10-CM

## 2019-12-09 DIAGNOSIS — I4891 Unspecified atrial fibrillation: Secondary | ICD-10-CM

## 2019-12-09 DIAGNOSIS — Z7901 Long term (current) use of anticoagulants: Secondary | ICD-10-CM | POA: Diagnosis not present

## 2019-12-09 DIAGNOSIS — Z79899 Other long term (current) drug therapy: Secondary | ICD-10-CM | POA: Diagnosis not present

## 2019-12-09 DIAGNOSIS — F1320 Sedative, hypnotic or anxiolytic dependence, uncomplicated: Secondary | ICD-10-CM

## 2019-12-09 DIAGNOSIS — F411 Generalized anxiety disorder: Secondary | ICD-10-CM | POA: Diagnosis not present

## 2019-12-09 DIAGNOSIS — E876 Hypokalemia: Secondary | ICD-10-CM

## 2019-12-09 DIAGNOSIS — I700 Atherosclerosis of aorta: Secondary | ICD-10-CM

## 2019-12-09 DIAGNOSIS — I6529 Occlusion and stenosis of unspecified carotid artery: Secondary | ICD-10-CM

## 2019-12-09 DIAGNOSIS — Z6841 Body Mass Index (BMI) 40.0 and over, adult: Secondary | ICD-10-CM

## 2019-12-09 DIAGNOSIS — F339 Major depressive disorder, recurrent, unspecified: Secondary | ICD-10-CM

## 2019-12-09 DIAGNOSIS — I10 Essential (primary) hypertension: Secondary | ICD-10-CM | POA: Diagnosis not present

## 2019-12-09 DIAGNOSIS — G4700 Insomnia, unspecified: Secondary | ICD-10-CM | POA: Diagnosis not present

## 2019-12-09 DIAGNOSIS — K219 Gastro-esophageal reflux disease without esophagitis: Secondary | ICD-10-CM

## 2019-12-09 LAB — CBC
HCT: 44 % (ref 39.0–52.0)
Hemoglobin: 15.7 g/dL (ref 13.0–17.0)
MCHC: 35.6 g/dL (ref 30.0–36.0)
MCV: 98.4 fl (ref 78.0–100.0)
Platelets: 181 10*3/uL (ref 150.0–400.0)
RBC: 4.48 Mil/uL (ref 4.22–5.81)
RDW: 16.5 % — ABNORMAL HIGH (ref 11.5–15.5)
WBC: 5.4 10*3/uL (ref 4.0–10.5)

## 2019-12-09 LAB — BASIC METABOLIC PANEL
BUN: 8 mg/dL (ref 6–23)
CO2: 30 mEq/L (ref 19–32)
Calcium: 9.4 mg/dL (ref 8.4–10.5)
Chloride: 100 mEq/L (ref 96–112)
Creatinine, Ser: 0.94 mg/dL (ref 0.40–1.50)
GFR: 79.19 mL/min (ref >60.00)
Glucose, Bld: 126 mg/dL — ABNORMAL HIGH (ref 70–99)
Potassium: 3.7 mEq/L (ref 3.5–5.1)
Sodium: 139 mEq/L (ref 135–145)

## 2019-12-09 MED ORDER — FLUTICASONE PROPIONATE 50 MCG/ACT NA SUSP: 2.0000 | Freq: Every day | NASAL | 6 refills | Status: DC

## 2019-12-09 MED ORDER — AMLODIPINE BESYLATE 10 MG PO TABS: 5.0000 mg | ORAL_TABLET | Freq: Every day | ORAL | 1 refills | Status: DC

## 2019-12-09 MED ORDER — LORAZEPAM 1 MG PO TABS: 1.0000 mg | ORAL_TABLET | Freq: Three times a day (TID) | ORAL | 5 refills | Status: DC

## 2019-12-09 MED ORDER — DULOXETINE HCL 30 MG PO CPEP: 30.0000 mg | ORAL_CAPSULE | Freq: Every day | ORAL | 1 refills | Status: DC

## 2019-12-09 MED ORDER — LEVOCETIRIZINE DIHYDROCHLORIDE 5 MG PO TABS: 2.5000 mg | ORAL_TABLET | Freq: Every evening | ORAL | 3 refills | Status: DC

## 2019-12-09 MED ORDER — PANTOPRAZOLE SODIUM 20 MG PO TBEC: 20.0000 mg | DELAYED_RELEASE_TABLET | Freq: Every day | ORAL | 3 refills | Status: DC

## 2019-12-09 MED ORDER — QUETIAPINE FUMARATE 100 MG PO TABS: 100.0000 mg | ORAL_TABLET | Freq: Every day | ORAL | 1 refills | Status: DC

## 2019-12-09 MED ORDER — LEVOTHYROXINE SODIUM 25 MCG PO TABS: 25.0000 ug | ORAL_TABLET | Freq: Every day | ORAL | 3 refills | Status: DC

## 2019-12-09 MED ORDER — ATORVASTATIN CALCIUM 20 MG PO TABS: 20.0000 mg | ORAL_TABLET | Freq: Every day | ORAL | 1 refills | Status: DC

## 2019-12-09 NOTE — Patient Instructions (Addendum)
STOP fluoxetine (prozac) and replace daily with cymbalta (duloxetine).  Other meds are the same. I have refilled all medications we prescribe you.  Make sure to follow up on schedule with your cardiologist as well for other medication refills and check ups.   Follow up with [place] in 5.5 months.    I hope you have a good Easter.

## 2019-12-09 NOTE — Progress Notes (Signed)
Patient ID: [name] Flesher Sr., male  DOB: [birth year], 69 y.o.   MRN: [id number] Patient Care Team    Relationship Specialty Notifications Start End  [name], DO PCP - General Family Medicine  11/08/17   Troy Sine, MD PCP - Cardiology Cardiology Admissions 02/17/19   [name], MD Consulting Physician Neurology  11/08/17   Clarene Essex, MD Consulting Physician Gastroenterology  11/29/17     Chief Complaint  Patient presents with  . Depression    Needs refills on all medications   . Insomnia    Subjective: [name] Flesher Sr. is a 69 y.o.  male present for follow up on chronic medical conditions  Major depression, recurrent, chronic (HCC)/Benzodiazepine dependence (HCC)/Insomnia, unspecified type/anxiety: Pt reports compliance with medications. He has been taking 100 mg of Seroquel QHS, Ativan 1 mg 3 times daily and prozac 60 mg QD.he feels he is doing well on his current regimen.  Unfortunately, per EKG he has developed elongated QTC per cardiology.  Both Seroquel and Prozac can worsen/cause elongated QTC.   Class 3 severe obesity due to excess calories with body mass index (BMI) of 40.0 to 44.9 in adult, unspecified whether serious comorbidity present (HCC)/Essential hypertension/a. Fib/aortic atherosclerosis/hypokalemia Pt reports compliance with amlodipine 5 mg QD, metoprolol 25 mg QD, spirolactone. Patient denies chest pain, shortness of breath, dizziness or lower extremity edema. He is on a statin and eliquis.  BMP: 02/10/2019 WNL CBC: 02/10/2019 within normal limits Lipid: 02/10/2019 Diet: Does not routinely watch his diet TSH: ELEVATED 02/2019>> started levothyroxine 25 mcg in the hospital- no followup labs Exercise: Does not routinely exercise RF: Hypertension, obesity, hyperlipidemia, diet-controlled diabetes Reviewed June hospitalization  for A.fib with rvr- he has followed with cardio.  - mycardinal perfusion scan- 05/08/2019  The left ventricular ejection  fraction is mildly decreased (45-54%).  Nuclear stress EF: 54%.  There was no ST segment deviation noted during stress.  Defect 1: There is a small defect of mild severity present in the apical anterior, apical septal and apex location.  This is a low risk study  Moderate OSA: - has CPAP. Had updated study 03/2019-patient reports he has been wearing his CPAP.   Depression screen [facility] 2/9 12/09/2019 05/26/2019 11/17/2018 05/14/2018 11/29/2017  Decreased Interest 0 0 0 1 -  Down, Depressed, Hopeless 1 1 1 3 3   PHQ - 2 Score 1 1 1 4 3   Altered sleeping 0 1 1 2 2   Tired, decreased energy 2 1 1  0 2  Change in appetite 1 1 0 0 2  Feeling bad or failure about yourself  0 1 1 1 1   Trouble concentrating 1 1 0 1 1  Moving slowly or fidgety/restless 0 1 0 0 0  Suicidal thoughts 0 0 0 0 -  PHQ-9 Score 5 7 4 8 11   Difficult doing work/chores Not difficult at all Somewhat difficult Somewhat difficult Somewhat difficult Very difficult   GAD 7 : Generalized Anxiety Score 12/09/2019 11/17/2018 11/08/2017  Nervous, Anxious, on Edge 1 1 2   Control/stop worrying 0 1 1  Worry too much - different things 0 1 1  Trouble relaxing 0 1 2  Restless 3 1 2   Easily annoyed or irritable 3 1 2   Afraid - awful might happen 0 1 1  Total GAD 7 Score 7 7 11   Anxiety Difficulty Not difficult at all Somewhat difficult -       Fall Risk  05/26/2019 04/24/2018 04/07/2018  11/08/2017 11/08/2017  Falls in the past year? 1 Yes Yes Yes Yes  Number falls in past yr: 0 2 or more 2 or more 2 or more 2 or more  Injury with Fall? 0 No No Yes Yes  Comment - - - few scratches few scratches  Risk Factor Category  - High Fall Risk High Fall Risk - -  Risk for fall due to : History of fall(s);Impaired vision;Medication side effect - - - -  Follow up Education provided;Falls evaluation completed;Falls prevention discussed Falls prevention discussed Falls prevention discussed - -   Immunization History  Administered Date(s) Administered  .  Influenza, High Dose Seasonal PF 05/14/2018  . Influenza-Unspecified 09/10/2017  . Pneumococcal Conjugate-13 05/14/2018  . Pneumococcal Polysaccharide-23 06/29/2015  . Tdap 03/17/2015   No exam data present  Past Medical History:  Diagnosis Date  . Adenomatous colon polyp 2016  . Allergy   . Anxiety   . Bell palsy    resolved  . Chicken pox   . Concussion with no loss of consciousness 07/07/2018  . Depression   . Diet-controlled diabetes mellitus ([place])   . Eating disorder   . GERD (gastroesophageal reflux disease)   . History of vitamin D deficiency   . Hyperlipidemia   . Hypertension   . Hypokalemia   . Memory loss   . MI (myocardial infarction) ([place])    per pt   . Ulcerative colitis ([place]) 2005-2006   severe    Allergies  Allergen Reactions  . Losartan Potassium-Hctz Diarrhea  . Nsaids     On eliquis.   [place] [Sertraline Hcl] Diarrhea   Past Surgical History:  Procedure Laterality Date  . COLONOSCOPY WITH PROPOFOL N/A 07/15/2014   Procedure: COLONOSCOPY WITH PROPOFOL;  Surgeon: [name], MD;  Location: [facility];  Service: Endoscopy;  Laterality: N/A;  . COLONOSCOPY WITH PROPOFOL N/A 08/22/2015   Procedure: COLONOSCOPY WITH PROPOFOL;  Surgeon: [name], MD;  Location: [facility];  Service: Endoscopy;  Laterality: N/A;  . ORIF ANKLE FRACTURE  02/27/2012   Procedure: OPEN REDUCTION INTERNAL FIXATION (ORIF) ANKLE FRACTURE;  Surgeon: Colin Rhein, MD;  Location: [place];  Service: Orthopedics;  Laterality: Left;  ORIF left bimalleolar ankle fracture  . SHOULDER SURGERY     LEFT  . SIGMOIDOSCOPY  10/08/2018   Externa and Internal Hmorrhoids. Tubular and Tublovillous adenoma removed from transverse colon. Inflammatory pseudopolyps, negative for dysplasia  . TONSILLECTOMY    . UPPER GASTROINTESTINAL ENDOSCOPY     Family History  Problem Relation Age of Onset  . Dementia Mother   . Hypertension Mother   . Early death Father  30       drowning   . Post-traumatic stress disorder Brother   . Neuropathy Brother    Social History   Socioeconomic History  . Marital status: Married    Spouse name: [name]  . Number of children: 2  . Years of education: Not on file  . Highest education level: Some college, no degree  Occupational History  . Not on file  Tobacco Use  . Smoking status: Former Smoker    Packs/day: 1.00    Years: 19.00    Pack years: 19.00    Quit date: 02/25/1982    Years since quitting: 37.8  . Smokeless tobacco: Former Systems developer    Quit date: 1990  Substance and Sexual Activity  . Alcohol use: Yes    Comment: occ  . Drug use: No  .  Sexual activity: Yes    Partners: Female  Other Topics Concern  . Not on file  Social History Narrative   Lives at home with wife [name].   College-educated. Works in Public house manager.    Social Determinants of Health   Financial Resource Strain:   . Difficulty of Paying Living Expenses:   Food Insecurity:   . Worried About Charity fundraiser in the Last Year:   . Arboriculturist in the Last Year:   Transportation Needs:   . Film/video editor (Medical):   Marland Kitchen Lack of Transportation (Non-Medical):   Physical Activity:   . Days of Exercise per Week:   . Minutes of Exercise per Session:   Stress:   . Feeling of Stress :   Social Connections:   . Frequency of Communication with Friends and Family:   . Frequency of Social Gatherings with Friends and Family:   . Attends Religious Services:   . Active Member of Clubs or Organizations:   . Attends Archivist Meetings:   Marland Kitchen Marital Status:   Intimate Partner Violence:   . Fear of Current or Ex-Partner:   . Emotionally Abused:   Marland Kitchen Physically Abused:   . Sexually Abused:    Allergies as of 12/09/2019      Reactions   Losartan Potassium-hctz Diarrhea   Nsaids    On eliquis.    Zoloft [sertraline Hcl] Diarrhea      Medication List       Accurate as of December 09, 2019 11:59  PM. If you have any questions, ask your nurse or doctor.        STOP taking these medications   FLUoxetine 20 MG capsule Commonly known as: PROZAC Stopped by: Howard Pouch, DO     TAKE these medications   amLODipine 10 MG tablet Commonly known as: NORVASC Take 0.5 tablets (5 mg total) by mouth daily.   apixaban 5 MG Tabs tablet Commonly known as: ELIQUIS Take 1 tablet (5 mg total) by mouth 2 (two) times daily.   atorvastatin 20 MG tablet Commonly known as: LIPITOR Take 1 tablet (20 mg total) by mouth daily.   azaTHIOprine 50 MG tablet Commonly known as: IMURAN Take 200 mg by mouth every morning.   dicyclomine 20 MG tablet Commonly known as: BENTYL Take 20 mg by mouth 4 (four) times daily -  before meals and at bedtime.   DULoxetine 30 MG capsule Commonly known as: Cymbalta Take 1 capsule (30 mg total) by mouth daily. Started by: Howard Pouch, DO   finasteride 5 MG tablet Commonly known as: PROSCAR Take 5 mg by mouth daily.   fluticasone 50 MCG/ACT nasal spray Commonly known as: FLONASE Place 2 sprays into both nostrils daily. What changed:   when to take this  reasons to take this   levocetirizine 5 MG tablet Commonly known as: XYZAL Take 0.5 tablets (2.5 mg total) by mouth every evening.   levothyroxine 25 MCG tablet Commonly known as: Synthroid Take 1 tablet (25 mcg total) by mouth daily before breakfast.   loperamide 2 MG tablet Commonly known as: Anti-Diarrheal Take 1 tablet (2 mg total) by mouth daily as needed.   LORazepam 1 MG tablet Commonly known as: ATIVAN Take 1 tablet (1 mg total) by mouth 3 (three) times daily.   metoprolol tartrate 25 MG tablet Commonly known as: LOPRESSOR Take 1 tablet (25 mg total) by mouth 2 (two) times daily for 30 days.  multivitamin with minerals Tabs tablet Take 1 tablet by mouth daily.   pantoprazole 20 MG tablet Commonly known as: PROTONIX Take 1 tablet (20 mg total) by mouth daily.   potassium  chloride 10 MEQ tablet Commonly known as: Klor-Con M10 Take 1 tablet (10 mEq total) by mouth daily.   QUEtiapine 100 MG tablet Commonly known as: SEROQUEL Take 1 tablet (100 mg total) by mouth at bedtime.   spironolactone 25 MG tablet Commonly known as: ALDACTONE Take 1 tablet (25 mg total) by mouth daily for 30 days.       All past medical history, surgical history, allergies, family history, immunizations andmedications were updated in the EMR today and reviewed under the history and medication portions of their EMR.     ROS: 14 pt review of systems performed and negative (unless mentioned in an HPI)  Objective: BP 130/86 (BP Location: Right Arm, Patient Position: Sitting, Cuff Size: Normal)   Pulse 89   Temp 97.8 F (36.6 C) (Temporal)   Resp 18   Ht 5' 8"  (1.727 m)   Wt 242 lb 2 oz (109.8 kg)   SpO2 98%   BMI 36.81 kg/m  Gen: Afebrile. No acute distress.  HENT: AT. [place].  Eyes:Pupils Equal Round Reactive to light, Extraocular movements intact,  Conjunctiva without redness, discharge or icterus. Neck/lymp/endocrine: Supple, no lymphadenopathy, no thyromegaly CV: RRR no murmur, no edema, +2/4 P posterior tibialis pulses Chest: CTAB, no wheeze or crackles Abd: Soft.  Obese. NTND. BS present.  Skin: No rashes, purpura or petechiae.  Neuro:  Normal gait. PERLA. EOMi. Alert. Oriented x3  Psych: Normal affect, dress and demeanor. Normal speech. Normal thought content and judgment  Assessment/plan: [name] Flesher Sr. is a 68 y.o. male present for  major depression/anxiety/insomnia/QT prolongation -Patient's condition is stable however we do need to alter regimen secondary to elongated QTC. -Therefore we will try to replace his Prozac with Cymbalta.  Patient was instructed to discontinue Prozac and replaced next day with Cymbalta 30 mg daily. -Continue Ativan 1 mg 3 times daily -Continue Seroquel 100 mg nightly -  Trazodone did not work well for him.  -  Patient is aware of  controlled substance policy.  He signed a controlled substance agreement.  Ativan 1 mg 3 times daily scheduled--> no exceptions.  - NCCS database reviewed 12/10/19  and appropriate.  - UDS: 02/17/2019 appropriate for Benzo use.  - No etoh.  -  referred to Dr. Mamie Levers 2019. - F/U 5.5 months.    Essential hypertension/obese/A. Fib/chronic anticoagulation/carotid artery calcification/on statin therapy: Stable. -Continue amlodipine 5 mg QD and K-Dur 10 mEq daily  -Continue meds per cardiology prescriptions: Metoprolol 25 mg BID, spirolactone 25 mg Qd, Eliquis twice daily  - continue follow-up per cardiology - Low-sodium diet.  Increase exercise. -CBC -BMP collected today to monitor potassium - F/U 5.5 months  Elevated TSH- Stable.  Last TSH 05/26/2019 within normal limits.   Continue levothyroxine 25 mcg daily.   GERD:  Stable. Continue Protonix daily  OSA: Patient reports compliance with CPAP  No follow-ups on file.  Orders Placed This Encounter  Procedures  . Basic Metabolic Panel (BMET)  . CBC    Meds ordered this encounter  Medications  . QUEtiapine (SEROQUEL) 100 MG tablet    Sig: Take 1 tablet (100 mg total) by mouth at bedtime.    Dispense:  90 tablet    Refill:  1  . pantoprazole (PROTONIX) 20 MG tablet    Sig: Take  1 tablet (20 mg total) by mouth daily.    Dispense:  90 tablet    Refill:  3  . levothyroxine (SYNTHROID) 25 MCG tablet    Sig: Take 1 tablet (25 mcg total) by mouth daily before breakfast.    Dispense:  90 tablet    Refill:  3  . levocetirizine (XYZAL) 5 MG tablet    Sig: Take 0.5 tablets (2.5 mg total) by mouth every evening.    Dispense:  45 tablet    Refill:  3  . fluticasone (FLONASE) 50 MCG/ACT nasal spray    Sig: Place 2 sprays into both nostrils daily.    Dispense:  16 g    Refill:  6  . amLODipine (NORVASC) 10 MG tablet    Sig: Take 0.5 tablets (5 mg total) by mouth daily.    Dispense:  45 tablet    Refill:  1  . atorvastatin  (LIPITOR) 20 MG tablet    Sig: Take 1 tablet (20 mg total) by mouth daily.    Dispense:  90 tablet    Refill:  1  . DULoxetine (CYMBALTA) 30 MG capsule    Sig: Take 1 capsule (30 mg total) by mouth daily.    Dispense:  90 capsule    Refill:  1    Please discontinue prozac scripts.  Marland Kitchen LORazepam (ATIVAN) 1 MG tablet    Sig: Take 1 tablet (1 mg total) by mouth 3 (three) times daily.    Dispense:  90 tablet    Refill:  5   Referral Orders  No referral(s) requested today    Note is dictated utilizing voice recognition software. Although note has been proof read prior to signing, occasional typographical errors still can be missed. If any questions arise, please do not hesitate to call for verification.  Electronically signed by: Howard Pouch, DO [place]

## 2019-12-10 MED ORDER — LOPERAMIDE HCL 2 MG PO TABS: 2.0000 mg | ORAL_TABLET | Freq: Every day | ORAL | 1 refills | Status: DC | PRN

## 2019-12-10 MED ORDER — POTASSIUM CHLORIDE CRYS ER 10 MEQ PO TBCR: 10.0000 meq | EXTENDED_RELEASE_TABLET | Freq: Every day | ORAL | 1 refills | Status: DC

## 2019-12-21 DIAGNOSIS — H532 Diplopia: Secondary | ICD-10-CM | POA: Diagnosis not present

## 2020-01-01 DIAGNOSIS — H532 Diplopia: Secondary | ICD-10-CM | POA: Diagnosis not present

## 2020-01-29 DIAGNOSIS — M1711 Unilateral primary osteoarthritis, right knee: Secondary | ICD-10-CM | POA: Diagnosis not present

## 2020-02-26 DIAGNOSIS — M1711 Unilateral primary osteoarthritis, right knee: Secondary | ICD-10-CM | POA: Diagnosis not present

## 2020-03-25 DIAGNOSIS — Z86010 Personal history of colonic polyps: Secondary | ICD-10-CM | POA: Diagnosis not present

## 2020-03-25 DIAGNOSIS — K5100 Ulcerative (chronic) pancolitis without complications: Secondary | ICD-10-CM | POA: Diagnosis not present

## 2020-04-08 ENCOUNTER — Telehealth: Payer: Self-pay | Admitting: *Deleted

## 2020-04-08 DIAGNOSIS — L820 Inflamed seborrheic keratosis: Secondary | ICD-10-CM | POA: Diagnosis not present

## 2020-04-08 DIAGNOSIS — D485 Neoplasm of uncertain behavior of skin: Secondary | ICD-10-CM | POA: Diagnosis not present

## 2020-04-08 DIAGNOSIS — L821 Other seborrheic keratosis: Secondary | ICD-10-CM | POA: Diagnosis not present

## 2020-04-08 DIAGNOSIS — L570 Actinic keratosis: Secondary | ICD-10-CM | POA: Diagnosis not present

## 2020-04-08 DIAGNOSIS — L814 Other melanin hyperpigmentation: Secondary | ICD-10-CM | POA: Diagnosis not present

## 2020-04-08 NOTE — Telephone Encounter (Signed)
Primary Cardiologist:Thomas Claiborne Billings, MD  Chart reviewed as part of pre-operative protocol coverage. Because of [name] SKALLA Sr.'s past medical history and time since last visit, he/she will require a follow-up visit in order to better assess preoperative cardiovascular risk.  Pre-op covering staff: - Please schedule appointment and call patient to inform them. - Please contact requesting surgeon's office via preferred method (i.e, phone, fax) to inform them of need for appointment prior to surgery.  If applicable, this message will also be routed to pharmacy pool and/or primary cardiologist for input on holding anticoagulant/antiplatelet agent as requested below so that this information is available at time of patient's appointment.   Deberah Pelton, NP  04/08/2020, 4:26 PM

## 2020-04-08 NOTE — Telephone Encounter (Signed)
Patient with diagnosis of afib on Eliquis for anticoagulation.    Procedure: COLONOSCOPY Date of procedure: 04/25/20  CHADS2-VASc score of  5 (CHF, HTN, AGE, DM2, CAD)  CrCl 87 ml/min  Per office protocol, patient can hold Eliquis for 1-2 days prior to procedure.

## 2020-04-08 NOTE — Telephone Encounter (Signed)
° °  [facility] Pre-operative Risk Assessment    HEARTCARE STAFF: - Please ensure there is not already an duplicate clearance open for this procedure. - Under Visit Info/Reason for Call, type in Other and utilize the format Clearance MM/DD/YY or Clearance TBD. Do not use dashes or single digits. - If request is for dental extraction, please clarify the # of teeth to be extracted.  Request for surgical clearance:  1. What type of surgery is being performed? COLONOSCOPY  2. When is this surgery scheduled? 04/25/20  3. What type of clearance is required (medical clearance vs. Pharmacy clearance to hold med vs. Both)? BOTH  4. Are there any medications that need to be held prior to surgery and how long? ELIQUIS NEED DIRECTION  5. Practice name and name of physician performing surgery? [facility]   6. What is the office phone number?  [phone]   7.   What is the office fax number?  [phone]  8.   Anesthesia type (None, local, MAC, general) ? unknown   Fredia Beets 04/08/2020, 2:45 PM  _________________________________________________________________   (provider comments below)

## 2020-04-11 NOTE — Telephone Encounter (Signed)
Will send a message to [facility] schedulers to assist in trying to find an appt for the pt for pre op clearance. I have reviewed the schedules though did not see anything available. Will see if the [facility] schedulers can find a place for a pre op appt for the pt.

## 2020-04-12 NOTE — Telephone Encounter (Signed)
Message -----  From: Rivka Barbara  Sent: 04/12/2020 12:41 PM EDT  To: Michae Kava, CMA  Subject: RE: NEED PRE OP APPT PLEASE            Hey I just wanted to give you a heads up. We have nothing for this patient. A message was sent to Dr. Claiborne Billings nurse to see if she had anything holding but no response yet. The earliest I have is not until 9/1 and that's with Nicki Reaper at [place] its 9/10 with Almyra Deforest.

## 2020-04-14 NOTE — Telephone Encounter (Signed)
LVM2CB on cell phone. Able to schedule pt on 04/19/20 at 4:40pm, need to make pt aware of appt. Will route to preop call back.

## 2020-04-14 NOTE — Telephone Encounter (Signed)
Will route to [facility] nurse to advise if able to assist in appointment.

## 2020-04-14 NOTE — Telephone Encounter (Signed)
Pt has been made aware that he has been scheduled to see Dr. Claiborne Billings 8/10 and to arrive at 4:25 to see Dr. Claiborne Billings for surgical clearance.    I will route back to the requesting surgeon's office to make them aware that the clearance will be addressed at that time.

## 2020-04-19 ENCOUNTER — Other Ambulatory Visit: Payer: Self-pay

## 2020-04-19 ENCOUNTER — Ambulatory Visit (INDEPENDENT_AMBULATORY_CARE_PROVIDER_SITE_OTHER): Payer: Medicare HMO | Admitting: Cardiovascular Disease

## 2020-04-19 ENCOUNTER — Encounter: Payer: Self-pay | Admitting: Cardiovascular Disease

## 2020-04-19 DIAGNOSIS — G4733 Obstructive sleep apnea (adult) (pediatric): Secondary | ICD-10-CM | POA: Diagnosis not present

## 2020-04-19 DIAGNOSIS — Z01810 Encounter for preprocedural cardiovascular examination: Secondary | ICD-10-CM | POA: Diagnosis not present

## 2020-04-19 DIAGNOSIS — Z01818 Encounter for other preprocedural examination: Secondary | ICD-10-CM

## 2020-04-19 DIAGNOSIS — I480 Paroxysmal atrial fibrillation: Secondary | ICD-10-CM | POA: Diagnosis not present

## 2020-04-19 DIAGNOSIS — Z7901 Long term (current) use of anticoagulants: Secondary | ICD-10-CM | POA: Diagnosis not present

## 2020-04-19 DIAGNOSIS — E039 Hypothyroidism, unspecified: Secondary | ICD-10-CM

## 2020-04-19 DIAGNOSIS — E785 Hyperlipidemia, unspecified: Secondary | ICD-10-CM

## 2020-04-19 DIAGNOSIS — I10 Essential (primary) hypertension: Secondary | ICD-10-CM

## 2020-04-19 NOTE — Patient Instructions (Signed)
Medication Instructions:  CONTINUE WITH CURRENT MEDICATIONS. NO CHANGES.  *If you need a refill on your cardiac medications before your next appointment, please call your pharmacy*  Follow-Up: FOLLOW UP WITH DR.KELLY AS NEEDED  Other Instructions HOLD ELIQUIS FOR 48 HOURS PRIOR TO COLONOSCOPY

## 2020-04-19 NOTE — Progress Notes (Signed)
Cardiology Office Note    Date:  04/20/2020   ID:  [name] Sr., DOB 1950/10/18, MRN [id number]  PCP:  [name], DO  Cardiologist:  Shelva Majestic, MD   New office evaluation with me for preoperative clearance per Dr. Watt Climes  History of Present Illness:  [name] Sr. is a 69 y.o. male who is referred to the office today for cardiology evaluation and preoperative clearance prior to undergoing colonoscopy.  [name] has a history of anxiety and depression, GERD, obesity, untreated obstructive sleep apnea, diabetes mellitus, hypertension, hyperlipidemia, hypothyroidism,benzodiazepine dependence as well as memory loss.  He has  a history of atrial fibrillation with RVR and has been maintaining sinus rhythm on anticoagulation therapy.  He was admitted to the hospital in June 2020 which time he was found to be in atrial fibrillation with RVR.  An echo Doppler study showed an EF greater than 65%, moderate concentric LVH, grade 2 diastolic dysfunction, elevated left atrial/left ventricular end-diastolic pressure and mild mitral annular calcification.  At that time he was started on metoprolol tartrate for rate control and Eliquis 5 mg twice daily for stroke prophylaxis given his CHA2DS2-VASc score of 3.  He was also started on levothyroxine for an elevated TSH level.  This 2020 a Lexiscan nuclear perfusion study was low risk and probably normal with mild thinning of the distal anteroseptal wall and apex without associated ischemia.  EF was 54% with normal wall motion.  He has a history of ulcerative colitis.  He is in need to undergo colonoscopy procedure.  He presents for preoperative clearance.  He denies any chest pain PND orthopnea.  He is unaware of any recurrent breakthrough atrial fibrillation.  He has a history of obstructive sleep apnea and has been followed by Dr. By Kathleen Argue neurology with Dr. Rexene Alberts.  Apparently a home study confirmed severe obstructive sleep apnea with CPAP was  recommended.  Due to nonuse, his CPAP was ultimately returned.  He has not been on therapy.  His sleep is poor.  He admits to daytime sleepiness.  According to his wife he snores.   Past Medical History:  Diagnosis Date  . Adenomatous colon polyp 2016  . Allergy   . Anxiety   . Bell palsy    resolved  . Chicken pox   . Concussion with no loss of consciousness 07/07/2018  . Depression   . Diet-controlled diabetes mellitus ([place])   . Eating disorder   . GERD (gastroesophageal reflux disease)   . History of vitamin D deficiency   . Hyperlipidemia   . Hypertension   . Hypokalemia   . Memory loss   . MI (myocardial infarction) ([place])    per pt   . Ulcerative colitis ([place]) 2005-2006   severe     Past Surgical History:  Procedure Laterality Date  . COLONOSCOPY WITH PROPOFOL N/A 07/15/2014   Procedure: COLONOSCOPY WITH PROPOFOL;  Surgeon: [name], MD;  Location: [facility];  Service: Endoscopy;  Laterality: N/A;  . COLONOSCOPY WITH PROPOFOL N/A 08/22/2015   Procedure: COLONOSCOPY WITH PROPOFOL;  Surgeon: [name], MD;  Location: [facility];  Service: Endoscopy;  Laterality: N/A;  . ORIF ANKLE FRACTURE  02/27/2012   Procedure: OPEN REDUCTION INTERNAL FIXATION (ORIF) ANKLE FRACTURE;  Surgeon: Colin Rhein, MD;  Location: [facility];  Service: Orthopedics;  Laterality: Left;  ORIF left bimalleolar ankle fracture  . SHOULDER SURGERY     LEFT  . SIGMOIDOSCOPY  10/08/2018   Externa and Internal Hmorrhoids. Tubular and Tublovillous adenoma removed from transverse colon. Inflammatory pseudopolyps, negative for dysplasia  . TONSILLECTOMY    . UPPER GASTROINTESTINAL ENDOSCOPY      Current Medications: Outpatient Medications Prior to Visit  Medication Sig Dispense Refill  . amLODipine (NORVASC) 10 MG tablet Take 0.5 tablets (5 mg total) by mouth daily. 45 tablet 1  . apixaban (ELIQUIS) 5 MG TABS tablet Take 1 tablet (5 mg total) by mouth 2 (two) times  daily. 60 tablet 5  . atorvastatin (LIPITOR) 20 MG tablet Take 1 tablet (20 mg total) by mouth daily. 90 tablet 1  . azaTHIOprine (IMURAN) 50 MG tablet Take 200 mg by mouth every morning.     . dicyclomine (BENTYL) 20 MG tablet Take 20 mg by mouth 4 (four) times daily -  before meals and at bedtime.    . DULoxetine (CYMBALTA) 30 MG capsule Take 1 capsule (30 mg total) by mouth daily. 90 capsule 1  . finasteride (PROSCAR) 5 MG tablet Take 5 mg by mouth daily.    . fluticasone (FLONASE) 50 MCG/ACT nasal spray Place 2 sprays into both nostrils daily. 16 g 6  . levocetirizine (XYZAL) 5 MG tablet Take 0.5 tablets (2.5 mg total) by mouth every evening. 45 tablet 3  . levothyroxine (SYNTHROID) 25 MCG tablet Take 1 tablet (25 mcg total) by mouth daily before breakfast. 90 tablet 3  . loperamide (ANTI-DIARRHEAL) 2 MG tablet Take 1 tablet (2 mg total) by mouth daily as needed. 90 tablet 1  . LORazepam (ATIVAN) 1 MG tablet Take 1 tablet (1 mg total) by mouth 3 (three) times daily. 90 tablet 5  . metoprolol tartrate (LOPRESSOR) 25 MG tablet Take 1 tablet (25 mg total) by mouth 2 (two) times daily for 30 days. 60 tablet 5  . Multiple Vitamin (MULTIVITAMIN WITH MINERALS) TABS tablet Take 1 tablet by mouth daily.    . pantoprazole (PROTONIX) 20 MG tablet Take 1 tablet (20 mg total) by mouth daily. 90 tablet 3  . potassium chloride (KLOR-CON M10) 10 MEQ tablet Take 1 tablet (10 mEq total) by mouth daily. 90 tablet 1  . QUEtiapine (SEROQUEL) 100 MG tablet Take 1 tablet (100 mg total) by mouth at bedtime. 90 tablet 1  . spironolactone (ALDACTONE) 25 MG tablet Take 1 tablet (25 mg total) by mouth daily for 30 days. 30 tablet 5   No facility-administered medications prior to visit.     Allergies:   Losartan potassium-hctz, Nsaids, and Zoloft [sertraline hcl]   Social History   Socioeconomic History  . Marital status: Married    Spouse name: [name]  . Number of children: 2  . Years of education: Not on file    . Highest education level: Some college, no degree  Occupational History  . Not on file  Tobacco Use  . Smoking status: Former Smoker    Packs/day: 1.00    Years: 19.00    Pack years: 19.00    Quit date: 02/25/1982    Years since quitting: 38.1  . Smokeless tobacco: Former Systems developer    Quit date: Biochemist, clinical  . Vaping Use: Never used  Substance and Sexual Activity  . Alcohol use: Yes    Comment: occ  . Drug use: No  . Sexual activity: Yes    Partners: Female  Other Topics Concern  . Not on file  Social History Narrative   Lives at home with wife [name].   College-educated. Works  in Public house manager.    Social Determinants of Health   Financial Resource Strain:   . Difficulty of Paying Living Expenses:   Food Insecurity:   . Worried About Charity fundraiser in the Last Year:   . Arboriculturist in the Last Year:   Transportation Needs:   . Film/video editor (Medical):   Marland Kitchen Lack of Transportation (Non-Medical):   Physical Activity:   . Days of Exercise per Week:   . Minutes of Exercise per Session:   Stress:   . Feeling of Stress :   Social Connections:   . Frequency of Communication with Friends and Family:   . Frequency of Social Gatherings with Friends and Family:   . Attends Religious Services:   . Active Member of Clubs or Organizations:   . Attends Archivist Meetings:   Marland Kitchen Marital Status:      Family History:  The patient's family history includes Dementia in his mother; Early death (age of onset: 38) in his father; Hypertension in his mother; Neuropathy in his brother; Post-traumatic stress disorder in his brother.   ROS General: Negative; No fevers, chills, or night sweats;  HEENT: Negative; No changes in vision or hearing, sinus congestion, difficulty swallowing Pulmonary: Negative; No cough, wheezing, shortness of breath, hemoptysis Cardiovascular: Negative; No chest pain, presyncope, syncope, palpitations GI: Negative; No  nausea, vomiting, diarrhea, or abdominal pain GU: Negative; No dysuria, hematuria, or difficulty voiding Musculoskeletal: Negative; no myalgias, joint pain, or weakness Hematologic/Oncology: Negative; no easy bruising, bleeding Endocrine: Negative; no heat/cold intolerance; no diabetes Neuro: Negative; no changes in balance, headaches Skin: Negative; No rashes or skin lesions Psychiatric: Negative; No behavioral problems, depression Sleep: Positive for severe's obstructive sleep apnea, CPAP was turned in and he is not on therapy; positive for snoring, daytime sleepiness, hypersomnolence; no bruxism, restless legs, hypnogognic hallucinations, no cataplexy Other comprehensive 14 point system review is negative.   PHYSICAL EXAM:   VS:  BP 140/82   Pulse 92   Ht 5' 8"  (1.727 m)   Wt 260 lb 6.4 oz (118.1 kg)   BMI 39.59 kg/m     Repeat blood pressure by me was 126/80  Wt Readings from Last 3 Encounters:  04/19/20 260 lb 6.4 oz (118.1 kg)  12/09/19 242 lb 2 oz (109.8 kg)  05/26/19 251 lb 6 oz (114 kg)    General: Alert, oriented, no distress.  Skin: normal turgor, no rashes, warm and dry HEENT: Normocephalic, atraumatic. Pupils equal round and reactive to light; sclera anicteric; extraocular muscles intact;  Nose without nasal septal hypertrophy Mouth/Parynx benign; Mallinpatti scale 3 Neck: No JVD, no carotid bruits; normal carotid upstroke Lungs: clear to ausculatation and percussion; no wheezing or rales Chest wall: without tenderness to palpitation Heart: PMI not displaced, RRR, s1 s2 normal, 1/6 systolic murmur, no diastolic murmur, no rubs, gallops, thrills, or heaves Abdomen: Central adiposity;soft, nontender; no hepatosplenomehaly, BS+; abdominal aorta nontender and not dilated by palpation. Back: no CVA tenderness Pulses 2+ Musculoskeletal: full range of motion, normal strength, no joint deformities Extremities: no clubbing cyanosis or edema, Homan's sign negative    Neurologic: grossly nonfocal; Cranial nerves grossly wnl Psychologic: Normal mood and affect   Studies/Labs Reviewed:   EKG:  EKG is ordered today.  ECG (independently read by me): Normal sinus rhythm at 92 bpm.  Nonspecific interventricular block.  QTc interval 472 ms.  No ectopy.  Recent Labs: BMP Latest Ref Rng & Units 12/09/2019 03/04/2019  02/18/2019  Glucose 70 - 99 mg/dL 126(H) 100(H) 118(H)  BUN 6 - 23 mg/dL 8 4(L) <5(L)  Creatinine 0.40 - 1.50 mg/dL 0.94 1.00 0.90  BUN/Creat Ratio 10 - 24 - 4(L) -  Sodium 135 - 145 mEq/L 139 139 138  Potassium 3.5 - 5.1 mEq/L 3.7 4.2 2.9(L)  Chloride 96 - 112 mEq/L 100 98 97(L)  CO2 19 - 32 mEq/L 30 27 29   Calcium 8.4 - 10.5 mg/dL 9.4 9.9 8.6(L)     Hepatic Function Latest Ref Rng & Units 02/17/2019 07/07/2018 01/03/2018  Total Protein 6.5 - 8.1 g/dL 6.1(L) 6.7 6.4  Albumin 3.5 - 5.0 g/dL 3.5 - 4.2  AST 15 - 41 U/L 47(H) 28 41(H)  ALT 0 - 44 U/L 22 22 22   Alk Phosphatase 38 - 126 U/L 81 - 70  Total Bilirubin 0.3 - 1.2 mg/dL 1.3(H) 0.9 1.1  Bilirubin, Direct 0.1 - 0.5 mg/dL - - -    CBC Latest Ref Rng & Units 12/09/2019 02/18/2019 02/17/2019  WBC 4.0 - 10.5 K/uL 5.4 7.0 8.2  Hemoglobin 13.0 - 17.0 g/dL 15.7 13.5 15.4  Hematocrit 39 - 52 % 44.0 38.1(L) 43.2  Platelets 150 - 400 K/uL 181.0 186 248   Lab Results  Component Value Date   MCV 98.4 12/09/2019   MCV 98.2 02/18/2019   MCV 98.4 02/17/2019   Lab Results  Component Value Date   TSH 4.21 05/26/2019   Lab Results  Component Value Date   HGBA1C 5.8 (H) 02/17/2019     BNP    Component Value Date/Time   BNP 170.5 (H) 02/17/2019 1907    ProBNP No results found for: PROBNP   Lipid Panel     Component Value Date/Time   CHOL 188 02/18/2019 0553   TRIG 164 (H) 02/18/2019 0553   HDL 44 02/18/2019 0553   CHOLHDL 4.3 02/18/2019 0553   VLDL 33 02/18/2019 0553   LDLCALC 111 (H) 02/18/2019 0553     RADIOLOGY: No results found.   Additional studies/ records that were  reviewed today include:    Nuclear Study Highlights: 05/08/2019    The left ventricular ejection fraction is mildly decreased (45-54%).  Nuclear stress EF: 54%.  There was no ST segment deviation noted during stress.  Defect 1: There is a small defect of mild severity present in the apical anterior, apical septal and apex location.  This is a low risk study.   Low risk, probably normal stress nuclear study with mild thinning of the distal anteroseptal wall and apex; no ischemia; EF low normal at 54 with normal wall motion.    ECHO: 02/18/2019 IMPRESSIONS  1. The left ventricle has hyperdynamic systolic function, with an  ejection fraction of >65%. The cavity size was normal. There is moderate  concentric left ventricular hypertrophy. Left ventricular diastolic  Doppler parameters are consistent with  pseudonormalization. Elevated left atrial and left ventricular  end-diastolic pressures.  2. The right ventricle has normal systolic function. The cavity was  normal. There is no increase in right ventricular wall thickness.  3. Left atrial size was moderately dilated.  4. The mitral valve is degenerative. Mild thickening of the mitral valve  leaflet. Mild calcification of the mitral valve leaflet. There is mild  mitral annular calcification present.    ASSESSMENT:    1. PAF (paroxysmal atrial fibrillation) ([place])   2. Chronic anticoagulation   3. Preoperative clearance   4. Essential hypertension   5. OSA (obstructive sleep apnea)  6. Dyslipidemia   7. Morbid obesity ([place])   8. Hypothyroidism, unspecified type      PLAN:  [name] is a 69 year old gentleman who has a history of hypertension, hyperlipidemia, anxiety/depression, GERD, untreated obstructive sleep apnea, remote benzodiazepam dependence who had developed atrial fibrillation with RVR leading to his hospitalization in June 2020.  He has been found to have normal LV function with EF greater than 65%,  grade 2 diastolic dysfunction, and elevated left atrial and left ventricular end-diastolic pressures.  A nuclear perfusion study was low risk and did not reveal any significant ischemia.  He has been maintaining sinus rhythm on low-dose metoprolol.  He is in need for colonoscopy.  He has a history of prior polyp removal.  He will need to hold Eliquis and I have recommended he hold Eliquis for 48 hours prior to his planned colonoscopy procedure.  Ultimately this will need to be reinstituted with timing dependent upon his colonoscopy findings.  His blood pressure today is relatively controlled on amlodipine 5 mg in addition to metoprolol tartrate 25 mg twice a day.  ECG shows sinus rhythm at 92.  If his heart rate continues to be elevated in the future may be worthwhile to titrate metoprolol slightly for improved rate control.  He continues to be on levothyroxine for mild hypothyroidism.  He is on atorvastatin 20 mg for hyperlipidemia.  In June 2020 LDL cholesterol was 111.  His laboratory has not been rechecked since this should be done.  I discussed with him his untreated sleep apnea and the fact that he was tested previously and shown to have severe sleep apnea.  I reviewed with him the effects of untreated sleep apnea with reference to his cardiovascular health and in particular discussed the increased recurrence risk of atrial fibrillation without therapy.  His wife who is on CPAP therapy would like him to undergo repeat evaluation but the patient is hesitant and will discuss further with his wife regarding possible reevaluation.  BMI is approaching morbid obesity at 39.59.  Weight loss and increased exercise is recommended.   Medication Adjustments/Labs and Tests Ordered: Current medicines are reviewed at length with the patient today.  Concerns regarding medicines are outlined above.  Medication changes, Labs and Tests ordered today are listed in the Patient Instructions below. Patient Instructions    Medication Instructions:  CONTINUE WITH CURRENT MEDICATIONS. NO CHANGES.  *If you need a refill on your cardiac medications before your next appointment, please call your pharmacy*  Follow-Up: FOLLOW UP WITH DR.Ronette Hank AS NEEDED  Other Instructions HOLD ELIQUIS FOR 48 HOURS PRIOR TO COLONOSCOPY    Signed, Shelva Majestic, MD  04/20/2020 4:09 PM    [facility] [address] [place], [place], [place]  [address] Phone: [phone]

## 2020-04-20 ENCOUNTER — Encounter: Payer: Self-pay | Admitting: Cardiovascular Disease

## 2020-06-02 ENCOUNTER — Other Ambulatory Visit: Payer: Self-pay | Admitting: Family Medicine

## 2020-06-04 NOTE — Telephone Encounter (Signed)
Attempted to call pt to schedule f/u with no answer. LVM to call monday

## 2020-06-07 ENCOUNTER — Other Ambulatory Visit: Payer: Self-pay | Admitting: Family Medicine

## 2020-06-08 ENCOUNTER — Other Ambulatory Visit: Payer: Self-pay | Admitting: Family Medicine

## 2020-06-08 NOTE — Telephone Encounter (Signed)
LM for patient to return call. Follow up appt needed. Last seen 12/09/19, advised to f/u 5.5 months

## 2020-06-28 ENCOUNTER — Other Ambulatory Visit: Payer: Self-pay | Admitting: Family Medicine

## 2020-06-30 ENCOUNTER — Ambulatory Visit (INDEPENDENT_AMBULATORY_CARE_PROVIDER_SITE_OTHER): Payer: Medicare HMO | Admitting: Family Medicine

## 2020-06-30 ENCOUNTER — Encounter: Payer: Self-pay | Admitting: Family Medicine

## 2020-06-30 ENCOUNTER — Other Ambulatory Visit: Payer: Self-pay

## 2020-06-30 VITALS — BP 150/94 | HR 103 | Temp 99.0°F | Ht 68.0 in | Wt 256.0 lb

## 2020-06-30 DIAGNOSIS — Z9989 Dependence on other enabling machines and devices: Secondary | ICD-10-CM

## 2020-06-30 DIAGNOSIS — Z79899 Other long term (current) drug therapy: Secondary | ICD-10-CM | POA: Diagnosis not present

## 2020-06-30 DIAGNOSIS — F411 Generalized anxiety disorder: Secondary | ICD-10-CM | POA: Diagnosis not present

## 2020-06-30 DIAGNOSIS — F0391 Unspecified dementia with behavioral disturbance: Secondary | ICD-10-CM

## 2020-06-30 DIAGNOSIS — R7309 Other abnormal glucose: Secondary | ICD-10-CM | POA: Diagnosis not present

## 2020-06-30 DIAGNOSIS — I4891 Unspecified atrial fibrillation: Secondary | ICD-10-CM

## 2020-06-30 DIAGNOSIS — G309 Alzheimer's disease, unspecified: Secondary | ICD-10-CM

## 2020-06-30 DIAGNOSIS — G4733 Obstructive sleep apnea (adult) (pediatric): Secondary | ICD-10-CM | POA: Diagnosis not present

## 2020-06-30 DIAGNOSIS — I10 Essential (primary) hypertension: Secondary | ICD-10-CM | POA: Diagnosis not present

## 2020-06-30 DIAGNOSIS — F1320 Sedative, hypnotic or anxiolytic dependence, uncomplicated: Secondary | ICD-10-CM

## 2020-06-30 DIAGNOSIS — F339 Major depressive disorder, recurrent, unspecified: Secondary | ICD-10-CM

## 2020-06-30 DIAGNOSIS — I700 Atherosclerosis of aorta: Secondary | ICD-10-CM | POA: Diagnosis not present

## 2020-06-30 DIAGNOSIS — Z6841 Body Mass Index (BMI) 40.0 and over, adult: Secondary | ICD-10-CM

## 2020-06-30 DIAGNOSIS — E119 Type 2 diabetes mellitus without complications: Secondary | ICD-10-CM | POA: Diagnosis not present

## 2020-06-30 DIAGNOSIS — G4700 Insomnia, unspecified: Secondary | ICD-10-CM

## 2020-06-30 DIAGNOSIS — I6529 Occlusion and stenosis of unspecified carotid artery: Secondary | ICD-10-CM

## 2020-06-30 DIAGNOSIS — Z7901 Long term (current) use of anticoagulants: Secondary | ICD-10-CM

## 2020-06-30 LAB — CBC WITH DIFFERENTIAL/PLATELET
Basophils Absolute: 0 10*3/uL (ref 0.0–0.1)
Basophils Relative: 0.6 % (ref 0.0–3.0)
Eosinophils Absolute: 0.1 10*3/uL (ref 0.0–0.7)
Eosinophils Relative: 1.8 % (ref 0.0–5.0)
HCT: 44.1 % (ref 39.0–52.0)
Hemoglobin: 15.3 g/dL (ref 13.0–17.0)
Lymphocytes Relative: 8.1 % — ABNORMAL LOW (ref 12.0–46.0)
Lymphs Abs: 0.6 10*3/uL — ABNORMAL LOW (ref 0.7–4.0)
MCHC: 34.7 g/dL (ref 30.0–36.0)
MCV: 97.6 fl (ref 78.0–100.0)
Monocytes Absolute: 0.5 10*3/uL (ref 0.1–1.0)
Monocytes Relative: 5.9 % (ref 3.0–12.0)
Neutro Abs: 6.5 10*3/uL (ref 1.4–7.7)
Neutrophils Relative %: 83.6 % — ABNORMAL HIGH (ref 43.0–77.0)
Platelets: 178 10*3/uL (ref 150.0–400.0)
RBC: 4.52 Mil/uL (ref 4.22–5.81)
RDW: 16.3 % — ABNORMAL HIGH (ref 11.5–15.5)
WBC: 7.8 10*3/uL (ref 4.0–10.5)

## 2020-06-30 LAB — HEMOGLOBIN A1C: Hgb A1c MFr Bld: 6.9 % — ABNORMAL HIGH (ref 4.6–6.5)

## 2020-06-30 LAB — T4, FREE: Free T4: 0.66 ng/dL (ref 0.60–1.60)

## 2020-06-30 LAB — VITAMIN B12: Vitamin B-12: 179 pg/mL — ABNORMAL LOW (ref 211–911)

## 2020-06-30 LAB — MAGNESIUM: Magnesium: 1.8 mg/dL (ref 1.5–2.5)

## 2020-06-30 LAB — VITAMIN D 25 HYDROXY (VIT D DEFICIENCY, FRACTURES): VITD: 27.31 ng/mL — ABNORMAL LOW (ref 30.00–100.00)

## 2020-06-30 LAB — TSH: TSH: 3.96 u[IU]/mL (ref 0.35–4.50)

## 2020-06-30 MED ORDER — FLUTICASONE PROPIONATE 50 MCG/ACT NA SUSP
2.0000 | Freq: Every day | NASAL | 11 refills | Status: DC
Start: 2020-06-30 — End: 2020-09-09

## 2020-06-30 MED ORDER — AMLODIPINE BESYLATE 10 MG PO TABS
5.0000 mg | ORAL_TABLET | Freq: Every day | ORAL | 1 refills | Status: DC
Start: 2020-06-30 — End: 2020-09-09

## 2020-06-30 MED ORDER — METOPROLOL TARTRATE 25 MG PO TABS: 25.0000 mg | ORAL_TABLET | Freq: Two times a day (BID) | ORAL | 5 refills | Status: DC

## 2020-06-30 MED ORDER — ATORVASTATIN CALCIUM 20 MG PO TABS
20.0000 mg | ORAL_TABLET | Freq: Every day | ORAL | 11 refills | Status: DC
Start: 2020-06-30 — End: 2021-03-01

## 2020-06-30 MED ORDER — APIXABAN 5 MG PO TABS: 5.0000 mg | ORAL_TABLET | Freq: Two times a day (BID) | ORAL | 11 refills | Status: DC

## 2020-06-30 MED ORDER — LOPERAMIDE HCL 2 MG PO TABS
2.0000 mg | ORAL_TABLET | Freq: Every day | ORAL | 1 refills | Status: DC | PRN
Start: 2020-06-30 — End: 2020-09-09

## 2020-06-30 MED ORDER — PANTOPRAZOLE SODIUM 20 MG PO TBEC
20.0000 mg | DELAYED_RELEASE_TABLET | Freq: Every day | ORAL | 3 refills | Status: DC
Start: 2020-06-30 — End: 2020-09-09

## 2020-06-30 NOTE — Progress Notes (Signed)
Patient ID: [name] Flesher Sr., male  DOB: 1950-12-03, 70 y.o.   MRN: [id number] Patient Care Team    Relationship Specialty Notifications Start End  [name], DO PCP - General Family Medicine  11/08/17   Troy Sine, MD PCP - Cardiology Cardiology Admissions 02/17/19   [name], MD Consulting Physician Neurology  11/08/17   Clarene Essex, MD Consulting Physician Gastroenterology  11/29/17   Melissa Noon, [place] Referring Physician Optometry  06/30/20   Rod Can, MD Consulting Physician Orthopedic Surgery  06/30/20   Ashley Royalty, [place]  Physician Assistant  06/30/20     Chief Complaint  Patient presents with  . Follow-up    Subjective: [name] WILK Sr. is a 69 y.o.  male present for follow up on chronic medical conditions.  He presents with his wife today which adds to the state of his current condition. Major depression, recurrent, chronic (HCC)/Benzodiazepine dependence (HCC)/Insomnia, unspecified type/anxiety: Pt reports compliance with medications with Seroquel 100 mg QHS, Ativan 1 mg 3 times daily and Cymbalta 30 mg QD.last visit 6 months ago patient's Prozac was removed and Cymbalta was added secondary to cardiology findings of QTC prolongation.  Patient's wife today states that he is more confused and noticing more memory changes.  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  She feels he has more anxiety and hopes to get better coverage.  Hypothyroidism: Patient reports compliance with levothyroxine 25 mcg daily.  Class 3 severe obesity due to excess calories with body mass index (BMI) of 40.0 to 44.9 in adult, unspecified whether serious comorbidity present (HCC)/Essential hypertension/a. Fib/aortic atherosclerosis/hypokalemia/A. Fib/chronic anticoagulation Pt reports compliance with amlodipine 5 mg QD.  He has not been taking his metoprolol, spirolactone or his Eliquis. .Patient denies chest pain, shortness of breath, dizziness or lower extremity edema.  Diet: Does not  routinely watch his diet Exercise: Does not routinely exercise RF: Hypertension, obesity, hyperlipidemia, diet-controlled diabetes  - mycardinal perfusion scan- 05/08/2019  The left ventricular ejection fraction is mildly decreased (45-54%).  Nuclear stress EF: 54%.  There was no ST segment deviation noted during stress.  Defect 1: There is a small defect of mild severity present in the apical anterior, apical septal and apex location.  This is a low risk study  Moderate OSA: - has CPAP. Had updated study 03/2019-patient reports he has been wearing his CPAP.   Depression screen [facility] 2/9 06/30/2020 12/09/2019 05/26/2019 11/17/2018 05/14/2018  Decreased Interest 3 0 0 0 1  Down, Depressed, Hopeless 3 1 1 1 3   PHQ - 2 Score 6 1 1 1 4   Altered sleeping 3 0 1 1 2   Tired, decreased energy 2 2 1 1  0  Change in appetite 3 1 1  0 0  Feeling bad or failure about yourself  3 0 1 1 1   Trouble concentrating 1 1 1  0 1  Moving slowly or fidgety/restless 2 0 1 0 0  Suicidal thoughts 1 0 0 0 0  PHQ-9 Score 21 5 7 4 8   Difficult doing work/chores - Not difficult at all Somewhat difficult Somewhat difficult Somewhat difficult   GAD 7 : Generalized Anxiety Score 06/30/2020 12/09/2019 11/17/2018 11/08/2017  Nervous, Anxious, on Edge 2 1 1 2   Control/stop worrying 3 0 1 1  Worry too much - different things 3 0 1 1  Trouble relaxing 1 0 1 2  Restless 1 3 1 2   Easily annoyed or irritable 2 3 1 2   Afraid -  awful might happen 3 0 1 1  Total GAD 7 Score 15 7 7 11   Anxiety Difficulty - Not difficult at all Somewhat difficult -       Fall Risk  05/26/2019 04/24/2018 04/07/2018 11/08/2017 11/08/2017  Falls in the past year? 1 Yes Yes Yes Yes  Number falls in past yr: 0 2 or more 2 or more 2 or more 2 or more  Injury with Fall? 0 No No Yes Yes  Comment - - - few scratches few scratches  Risk Factor Category  - High Fall Risk High Fall Risk - -  Risk for fall due to : History of fall(s);Impaired vision;Medication  side effect - - - -  Follow up Education provided;Falls evaluation completed;Falls prevention discussed Falls prevention discussed Falls prevention discussed - -   Immunization History  Administered Date(s) Administered  . Influenza, High Dose Seasonal PF 05/14/2018, 05/23/2020  . Influenza-Unspecified 09/10/2017, 05/23/2020  . Moderna SARS-COVID-2 Vaccination 12/25/2019, 01/22/2020  . PFIZER SARS-COV-2 Vaccination 05/23/2020  . Pneumococcal Conjugate-13 05/14/2018  . Pneumococcal Polysaccharide-23 06/29/2015  . Tdap 03/17/2015   No exam data present  Past Medical History:  Diagnosis Date  . Adenomatous colon polyp 2016  . Allergy   . Anxiety   . Bell palsy    resolved  . Chicken pox   . Concussion with no loss of consciousness 07/07/2018  . Depression   . Diet-controlled diabetes mellitus ([place])   . Eating disorder   . GERD (gastroesophageal reflux disease)   . History of vitamin D deficiency   . Hyperlipidemia   . Hypertension   . Hypokalemia   . Memory loss   . MI (myocardial infarction) ([place])    per pt   . Ulcerative colitis ([place]) 2005-2006   severe    Allergies  Allergen Reactions  . Losartan Potassium-Hctz Diarrhea  . Nsaids     On eliquis.   [place] [Sertraline Hcl] Diarrhea   Past Surgical History:  Procedure Laterality Date  . COLONOSCOPY WITH PROPOFOL N/A 07/15/2014   Procedure: COLONOSCOPY WITH PROPOFOL;  Surgeon: [name], MD;  Location: [facility];  Service: Endoscopy;  Laterality: N/A;  . COLONOSCOPY WITH PROPOFOL N/A 08/22/2015   Procedure: COLONOSCOPY WITH PROPOFOL;  Surgeon: [name], MD;  Location: [facility];  Service: Endoscopy;  Laterality: N/A;  . ORIF ANKLE FRACTURE  02/27/2012   Procedure: OPEN REDUCTION INTERNAL FIXATION (ORIF) ANKLE FRACTURE;  Surgeon: Colin Rhein, MD;  Location: [facility];  Service: Orthopedics;  Laterality: Left;  ORIF left bimalleolar ankle fracture  . SHOULDER SURGERY     LEFT  .  SIGMOIDOSCOPY  10/08/2018   Externa and Internal Hmorrhoids. Tubular and Tublovillous adenoma removed from transverse colon. Inflammatory pseudopolyps, negative for dysplasia  . TONSILLECTOMY    . UPPER GASTROINTESTINAL ENDOSCOPY     Family History  Problem Relation Age of Onset  . Dementia Mother   . Hypertension Mother   . Early death Father 11       drowning   . Post-traumatic stress disorder Brother   . Neuropathy Brother    Social History   Socioeconomic History  . Marital status: Married    Spouse name: [name]  . Number of children: 2  . Years of education: Not on file  . Highest education level: Some college, no degree  Occupational History  . Not on file  Tobacco Use  . Smoking status: Former Smoker    Packs/day: 1.00  Years: 19.00    Pack years: 19.00    Quit date: 02/25/1982    Years since quitting: 38.3  . Smokeless tobacco: Former Systems developer    Quit date: Biochemist, clinical  . Vaping Use: Never used  Substance and Sexual Activity  . Alcohol use: Yes    Comment: occ  . Drug use: No  . Sexual activity: Yes    Partners: Female  Other Topics Concern  . Not on file  Social History Narrative   Lives at home with wife [name].   College-educated. Works in Public house manager.    Social Determinants of Health   Financial Resource Strain:   . Difficulty of Paying Living Expenses: Not on file  Food Insecurity:   . Worried About Charity fundraiser in the Last Year: Not on file  . Ran Out of Food in the Last Year: Not on file  Transportation Needs:   . Lack of Transportation (Medical): Not on file  . Lack of Transportation (Non-Medical): Not on file  Physical Activity:   . Days of Exercise per Week: Not on file  . Minutes of Exercise per Session: Not on file  Stress:   . Feeling of Stress : Not on file  Social Connections:   . Frequency of Communication with Friends and Family: Not on file  . Frequency of Social Gatherings with Friends and Family:  Not on file  . Attends Religious Services: Not on file  . Active Member of Clubs or Organizations: Not on file  . Attends Archivist Meetings: Not on file  . Marital Status: Not on file  Intimate Partner Violence:   . Fear of Current or Ex-Partner: Not on file  . Emotionally Abused: Not on file  . Physically Abused: Not on file  . Sexually Abused: Not on file   Allergies as of 06/30/2020      Reactions   Losartan Potassium-hctz Diarrhea   Nsaids    On eliquis.    Zoloft [sertraline Hcl] Diarrhea      Medication List       Accurate as of June 30, 2020 11:59 PM. If you have any questions, ask your nurse or doctor.        STOP taking these medications   finasteride 5 MG tablet Commonly known as: PROSCAR Stopped by: Howard Pouch, DO   multivitamin with minerals Tabs tablet Stopped by: Howard Pouch, DO   polyethylene glycol-electrolytes 420 g solution Commonly known as: NuLYTELY Stopped by: Howard Pouch, DO   QUEtiapine 100 MG tablet Commonly known as: SEROQUEL Stopped by: Howard Pouch, DO   spironolactone 25 MG tablet Commonly known as: ALDACTONE Stopped by: Howard Pouch, DO     TAKE these medications   amLODipine 10 MG tablet Commonly known as: NORVASC Take 0.5 tablets (5 mg total) by mouth daily.   apixaban 5 MG Tabs tablet Commonly known as: ELIQUIS Take 1 tablet (5 mg total) by mouth 2 (two) times daily.   ARIPiprazole 10 MG tablet Commonly known as: Abilify Take 1 tablet (10 mg total) by mouth daily. Started by: Howard Pouch, DO   atorvastatin 20 MG tablet Commonly known as: LIPITOR Take 1 tablet (20 mg total) by mouth daily.   azaTHIOprine 50 MG tablet Commonly known as: IMURAN Take 200 mg by mouth every morning.   Cyanocobalamin 2500 MCG Tabs 1 tab daily po Started by: Howard Pouch, DO   dicyclomine 20 MG tablet Commonly known as: BENTYL Take 20  mg by mouth 4 (four) times daily -  before meals and at bedtime.   DULoxetine 60  MG capsule Commonly known as: CYMBALTA Take 1 capsule (60 mg total) by mouth daily. What changed:   medication strength  how much to take Changed by: Howard Pouch, DO   fluticasone 50 MCG/ACT nasal spray Commonly known as: FLONASE Place 2 sprays into both nostrils daily. What changed: See the new instructions. Changed by: Howard Pouch, DO   levocetirizine 5 MG tablet Commonly known as: XYZAL Take 0.5 tablets (2.5 mg total) by mouth every evening.   levothyroxine 25 MCG tablet Commonly known as: Synthroid Take 1 tablet (25 mcg total) by mouth daily before breakfast.   loperamide 2 MG tablet Commonly known as: Anti-Diarrheal Take 1 tablet (2 mg total) by mouth daily as needed.   LORazepam 0.5 MG tablet Commonly known as: ATIVAN Take 1 tablet (0.5 mg total) by mouth 3 (three) times daily. What changed:   medication strength  how much to take Changed by: Howard Pouch, DO   metoprolol tartrate 25 MG tablet Commonly known as: LOPRESSOR Take 1 tablet (25 mg total) by mouth 2 (two) times daily.   pantoprazole 20 MG tablet Commonly known as: PROTONIX Take 1 tablet (20 mg total) by mouth daily.   potassium chloride 10 MEQ tablet Commonly known as: [place]-Con M10 Take 1 tablet (10 mEq total) by mouth daily. What changed: See the new instructions. Changed by: Howard Pouch, DO       All past medical history, surgical history, allergies, family history, immunizations andmedications were updated in the EMR today and reviewed under the history and medication portions of their EMR.     ROS: 14 pt review of systems performed and negative (unless mentioned in an HPI)  Objective: BP (!) 150/94   Pulse (!) 103   Temp 99 F (37.2 C) (Oral)   Ht 5' 8"  (1.727 m)   Wt 256 lb (116.1 kg)   SpO2 97%   BMI 38.92 kg/m  Gen: Afebrile. No acute distress.  Nontoxic in presentation, pleasant male with cognitive decline HENT: AT. [place].  Eyes:Pupils Equal Round Reactive to light,  Extraocular movements intact,  Conjunctiva without redness, discharge or icterus. Neck/lymp/endocrine: Supple, no lymphadenopathy, no thyromegaly CV: RRR 1/6 SM, no edema Chest: CTAB, no wheeze or crackles.  Neuro:  Normal gait. PERLA. EOMi. Alert. Oriented x3 Psych: Normal dress.  Repeats topics and sentences unknowingly.  Makes good eye contact.  Attempts humor.   Assessment/plan: [name] Flesher Sr. is a 69 y.o. male present for  Dementia with behavioral disturbance: Dementia/cognitive decline seems to have worsened over the last few weeks per patient's wife.  In medication review, he has not been compliant with either his metoprolol or his Eliquis.  Therefore cannot rule out stroke as a cause of his acutely worsening dementia and confusion.  She reports an event where he was being on their bedroom door asking if anyone seen "[name] " (wife's name).  -MRI brain -CBC, CMP, TSH, T4 free, vitamin D, B12, B1, magnesium, ammonia collected today. -Restart metoprolol and Eliquis.  major depression/anxiety/insomnia/QT prolongation - start Abilify 10 mg QD -Increase Cymbalta 30 mg> 60 mg daily. - decrease  Ativan  To 0.5 mg TID> will attempt to wean off if able.  - DC seroquel- not working well and QT elongation. Trazodone did not work well for him.  -  Patient is aware of controlled substance policy.  He signed a controlled substance agreement.   -  NCCS database reviewed 07/07/20 and appropriate.  - UDS: collected today.   - Advised No etoh> he is drinking.  He & his wife were counseled on effects of alcohol on his prescribed medications. He was strongly encouraged to stop drinking.  - he will benefit from referral to psychiatry> referral placed today. He had been referred in the past and did not establish. Pt and his wife encouraged to establish.  - F/U 5.5 months.    Essential hypertension/obese/A. Fib/chronic anticoagulation/carotid artery calcification/on statin therapy: Pt not taking his  medications accurately.  -Continue amlodipine 5 mg QD and K-Dur 10 mEq daily  - RESTART metoprolol 25 mg BID and Eliquis BID.  -Consider spironolactone restart after follow-up - continue follow-up per cardiology - Low-sodium diet.  Increase exercise. - F/U 5.5 months  Elevated TSH- Collected today.  Labs due- TSH collected today Continue levothyroxine 25 mcg daily if labs normal- if abnormal will alter dose.   GERD:  Stable.  Continue Protonix daily  OSA: Patient reports he returned the CPAP machine bc he could not tolerate mask.  Lengthy conversation today with patient and his wife surrounding the absolute need of CPAP nightly.  Neurology has worked with him and attempts to find a way to make him more comfortable with the CPAP machine.  He still has not been able to tolerate.  Advised him today he may not be comfortable wearing the CPAP machine, but this is something he is going to have to come to terms with needing to do despite it being uncomfortable.  Without CPAP his dementia will worsen.  He states he will do whatever is needed, and including wear CPAP. Will place referral back to his neurology team to get him set up again.  Close follow-up will be arranged dependent upon lab results.  No follow-ups on file.  Orders Placed This Encounter  Procedures  . MR Brain W Wo Contrast  . Comp Met (CMET)  . Magnesium  . B12  . Vitamin B1  . TSH  . T4, free  . Vitamin D (25 hydroxy)  . CBC w/Diff  . Hemoglobin A1c  . DRUG MONITORING, PANEL 8 WITH CONFIRMATION, URINE  . Urinalysis w microscopic + reflex cultur  . Ammonia  . REFLEXIVE URINE CULTURE  . DM TEMPLATE  . Ambulatory referral to Neurology  . Ambulatory referral to Psychiatry    Meds ordered this encounter  Medications  . pantoprazole (PROTONIX) 20 MG tablet    Sig: Take 1 tablet (20 mg total) by mouth daily.    Dispense:  90 tablet    Refill:  3  . loperamide (ANTI-DIARRHEAL) 2 MG tablet    Sig: Take 1 tablet (2  mg total) by mouth daily as needed.    Dispense:  90 tablet    Refill:  1  . amLODipine (NORVASC) 10 MG tablet    Sig: Take 0.5 tablets (5 mg total) by mouth daily.    Dispense:  45 tablet    Refill:  1  . apixaban (ELIQUIS) 5 MG TABS tablet    Sig: Take 1 tablet (5 mg total) by mouth 2 (two) times daily.    Dispense:  60 tablet    Refill:  11  . metoprolol tartrate (LOPRESSOR) 25 MG tablet    Sig: Take 1 tablet (25 mg total) by mouth 2 (two) times daily.    Dispense:  60 tablet    Refill:  5  . fluticasone (FLONASE) 50 MCG/ACT nasal spray  Sig: Place 2 sprays into both nostrils daily.    Dispense:  16 g    Refill:  11  . atorvastatin (LIPITOR) 20 MG tablet    Sig: Take 1 tablet (20 mg total) by mouth daily.    Dispense:  30 tablet    Refill:  11    OFFICE VISIT NEEDED  . levothyroxine (SYNTHROID) 25 MCG tablet    Sig: Take 1 tablet (25 mcg total) by mouth daily before breakfast.    Dispense:  90 tablet    Refill:  3  . potassium chloride (KLOR-CON M10) 10 MEQ tablet    Sig: Take 1 tablet (10 mEq total) by mouth daily.    Dispense:  90 tablet    Refill:  3  . DULoxetine (CYMBALTA) 60 MG capsule    Sig: Take 1 capsule (60 mg total) by mouth daily.    Dispense:  90 capsule    Refill:  1  . ARIPiprazole (ABILIFY) 10 MG tablet    Sig: Take 1 tablet (10 mg total) by mouth daily.    Dispense:  90 tablet    Refill:  1  . Cyanocobalamin 2500 MCG TABS    Sig: 1 tab daily po    Dispense:  90 tablet    Refill:  1  . LORazepam (ATIVAN) 0.5 MG tablet    Sig: Take 1 tablet (0.5 mg total) by mouth 3 (three) times daily.    Dispense:  90 tablet    Refill:  5    Referral Orders     Ambulatory referral to Neurology     Ambulatory referral to Psychiatry  > 55 Minutes was dedicated to this patient's encounter to include pre-visit review of chart, face-to-face time with patient and post-visit work- which include documentation and prescribing medications and/or ordering test when  necessary.    Note is dictated utilizing voice recognition software. Although note has been proof read prior to signing, occasional typographical errors still can be missed. If any questions arise, please do not hesitate to call for verification.  Electronically signed by: Howard Pouch, DO [place]

## 2020-06-30 NOTE — Patient Instructions (Signed)
Please compare med list today to printed list today and call back in today with information.   I will call you back with all lab results and recommendations.    Dementia Dementia is a condition that affects the way the brain functions. It often affects memory and thinking. Usually, dementia gets worse with time and cannot be reversed (progressive dementia). There are many types of dementia, including:  Alzheimer's disease. This type is the most common.  Vascular dementia. This type may happen as the result of a stroke.  Lewy body dementia. This type may happen to people who have Parkinson's disease.  Frontotemporal dementia. This type is caused by damage to nerve cells (neurons) in certain parts of the brain. Some people may be affected by more than one type of dementia. This is called mixed dementia. What are the causes? Dementia is caused by damage to cells in the brain. The area of the brain and the types of cells damaged determine the type of dementia. Usually, this damage is irreversible or cannot be undone. Some examples of irreversible causes include:  Conditions that affect the blood vessels of the brain, such as diabetes, heart disease, or blood vessel disease.  Genetic mutations. In some cases, changes in the brain may be caused by another condition and can be reversed or slowed. Some examples of reversible causes include:  Injury to the brain.  Certain medicines.  Infection, such as meningitis.  Metabolic problems, such as vitamin B12 deficiency or thyroid disease.  Pressure on the brain, such as from a tumor or blood clot. What are the signs or symptoms? Symptoms of dementia depend on the type of dementia. Common signs of dementia include problems with remembering, thinking, problem solving, decision making, and communicating. These signs develop slowly or get worse with time. This may include:  Problems remembering things.  Having trouble taking a bath or putting  clothes on.  Forgetting appointments.  Forgetting to pay bills.  Difficulty planning and preparing meals.  Having trouble speaking.  Getting lost easily. How is this diagnosed? This condition is diagnosed by a specialist (neurologist). It is diagnosed based on the history of your symptoms, your medical history, a physical exam, and tests. Tests may include:  Tests to evaluate brain function, such as memory tests, cognitive tests, and other tests.  Lab tests, such as blood or urine tests.  Imaging tests, such as a CT scan, a PET scan, or an MRI.  Genetic testing. This may be done if other family members have a diagnosis of certain types of dementia. Your health care provider will talk with you and your family, friends, or caregivers about your history and symptoms. How is this treated?  Treatment for this condition depends on the cause of the dementia. Progressive dementias, such as Alzheimer's disease, cannot be cured, but there may be treatments that help to manage symptoms. Treatment might involve taking medicines that may help to:  Control the dementia.  Slow down the progression of the dementia.  Manage symptoms. In some cases, treating the cause of your dementia can improve symptoms, reverse symptoms, or slow down how quickly your dementia becomes worse. Your health care provider can direct you to support groups, organizations, and other health care providers who can help with decisions about your care. Follow these instructions at home: Medicines  Take over-the-counter and prescription medicines only as told by your health care provider.  Use a pill organizer or pill reminder to help you manage your medicines.  Avoid  taking medicines that can affect thinking, such as pain medicines or sleeping medicines. Lifestyle  Make healthy lifestyle choices. ? Be physically active as told by your health care provider. ? Do not use any products that contain nicotine or tobacco,  such as cigarettes, e-cigarettes, and chewing tobacco. If you need help quitting, ask your health care provider. ? Do not drink alcohol. ? Practice stress-management techniques when you get stressed. ? Spend time with other people.  Make sure to get quality sleep. These tips can help you get a good night's rest: ? Avoid napping during the day. ? Keep your sleeping area dark and cool. ? Avoid exercising during the few hours before you go to bed. ? Avoid caffeine products in the evening. Eating and drinking  Drink enough fluid to keep your urine pale yellow.  Eat a healthy diet. General instructions   Work with your health care provider to determine what you need help with and what your safety needs are.  Talk with your health care provider about whether it is safe for you to drive.  If you were given a bracelet that identifies you as a person with memory loss or tracks your location, make sure to wear it at all times.  Work with your family to make important decisions, such as advance directives, medical power of attorney, or a living will.  Keep all follow-up visits as told by your health care provider. This is important. Where to find more information  Alzheimer's Association: CapitalMile.co.nz  National Institute on Aging: DVDEnthusiasts.nl  World Health Organization: RoleLink.com.br Contact a health care provider if:  You have any new or worsening symptoms.  You have problems with choking or swallowing. Get help right away if:  You feel depressed or sad, or feel that you want to harm yourself.  Your family members become concerned for your safety. If you ever feel like you may hurt yourself or others, or have thoughts about taking your own life, get help right away. You can go to your nearest emergency department or call:  Your local emergency services (911 in the [place]  A suicide crisis helpline, such as the [place] at 607 684 9559.  This is open 24 hours a day. Summary  Dementia is a condition that affects the way the brain functions. Dementia often affects memory and thinking.  Usually, dementia gets worse with time and cannot be reversed (progressive dementia).  Treatment for this condition depends on the cause of the dementia.  Work with your health care provider to determine what you need help with and what your safety needs are.  Your health care provider can direct you to support groups, organizations, and other health care providers who can help with decisions about your care. This information is not intended to replace advice given to you by your health care provider. Make sure you discuss any questions you have with your health care provider. Document Revised: 11/11/2018 Document Reviewed: 11/11/2018 Elsevier Patient Education  [place].

## 2020-07-01 ENCOUNTER — Ambulatory Visit (INDEPENDENT_AMBULATORY_CARE_PROVIDER_SITE_OTHER): Payer: Medicare HMO

## 2020-07-01 ENCOUNTER — Telehealth: Payer: Self-pay | Admitting: Family Medicine

## 2020-07-01 DIAGNOSIS — F0391 Unspecified dementia with behavioral disturbance: Secondary | ICD-10-CM | POA: Diagnosis not present

## 2020-07-01 LAB — AMMONIA: Ammonia: 38 umol/L — ABNORMAL HIGH (ref 11–35)

## 2020-07-01 LAB — COMPREHENSIVE METABOLIC PANEL
ALT: 13 U/L (ref 0–53)
AST: 19 U/L (ref 0–37)
Albumin: 4.4 g/dL (ref 3.5–5.2)
Alkaline Phosphatase: 55 U/L (ref 39–117)
BUN: 6 mg/dL (ref 6–23)
CO2: 30 mEq/L (ref 19–32)
Calcium: 9.9 mg/dL (ref 8.4–10.5)
Chloride: 97 mEq/L (ref 96–112)
Creatinine, Ser: 0.96 mg/dL (ref 0.40–1.50)
GFR: 85 mL/min (ref >60.00)
Glucose, Bld: 150 mg/dL — ABNORMAL HIGH (ref 70–99)
Potassium: 3.7 mEq/L (ref 3.5–5.1)
Sodium: 137 mEq/L (ref 135–145)
Total Bilirubin: 0.7 mg/dL (ref 0.2–1.2)
Total Protein: 6.5 g/dL (ref 6.0–8.3)

## 2020-07-01 MED ORDER — LEVOTHYROXINE SODIUM 25 MCG PO TABS: 25.0000 ug | ORAL_TABLET | Freq: Every day | ORAL | 3 refills | Status: DC

## 2020-07-01 MED ORDER — LORAZEPAM 0.5 MG PO TABS
0.5000 mg | ORAL_TABLET | Freq: Three times a day (TID) | ORAL | 5 refills | Status: DC
Start: 2020-07-01 — End: 2020-09-09

## 2020-07-01 MED ORDER — CYANOCOBALAMIN 2500 MCG PO TABS: ORAL_TABLET | ORAL | 1 refills | Status: DC

## 2020-07-01 MED ORDER — SITAGLIPTIN PHOSPHATE 25 MG PO TABS: 25.0000 mg | ORAL_TABLET | Freq: Every day | ORAL | 1 refills | Status: DC

## 2020-07-01 MED ORDER — ARIPIPRAZOLE 10 MG PO TABS: 10.0000 mg | ORAL_TABLET | Freq: Every day | ORAL | 1 refills | Status: DC

## 2020-07-01 MED ORDER — DULOXETINE HCL 60 MG PO CPEP
60.0000 mg | ORAL_CAPSULE | Freq: Every day | ORAL | 1 refills | Status: DC
Start: 2020-07-01 — End: 2020-09-14

## 2020-07-01 MED ORDER — POTASSIUM CHLORIDE CRYS ER 10 MEQ PO TBCR: 10.0000 meq | EXTENDED_RELEASE_TABLET | Freq: Every day | ORAL | 3 refills | Status: DC

## 2020-07-01 NOTE — Telephone Encounter (Signed)
Spoke with wife and scheduled f/u appt

## 2020-07-01 NOTE — Telephone Encounter (Signed)
Please call patient's wife to give report.  Patient is having memory issues. Liver, kidney and thyroid function are normal Blood cell counts and electrolytes are normal Urinalysis was normal. Glucose is elevated and his A1c is 6.9 this is in the diabetic range and we will need to start medication and start monitoring A1c every 4 months.    -New medication called in his Januvia to be taken once daily.   He was not taking metoprolol or apixaban which is concerning.  This ensures his heart rate stays in normal range with a normal rhythm.  This helps prevent his atrial fibrillation.  The apixaban is the blood thinner.  Without these 2 medications it is possible he could have had a TIA/small strokes that caused him to have the acute memory decline she has been witnessing.  Therefore I did order an MRI and he should be calling them to schedule if they have not already.   -I increased his Cymbalta dose for better coverage and added a medication called Abilify. I have refilled his amlodipine, atorvastatin, nasal spray, Klor-Con, thyroid medicine, Loperamide, pantoprazole, metoprolol, apixaban, ativan.  There are medications we need to discontinue in order to get him safer and better coverage for his condition. -Seroquel/quetiapine -I am also lowering his Ativan dose for now, secondary to his confusion but he will have more anxiety coverage with other meds.   Please make sure she understands med list changes and they log on to see the full correct list.  Follow 4 weeks- please schedule. Bring all meds he is taking at that visit also. I would also appreciate it if she would attend appt if she is able.   Thanks.

## 2020-07-06 ENCOUNTER — Other Ambulatory Visit: Payer: Self-pay | Admitting: Family Medicine

## 2020-07-07 ENCOUNTER — Telehealth: Payer: Medicare HMO | Admitting: Family Medicine

## 2020-07-07 ENCOUNTER — Encounter: Payer: Self-pay | Admitting: Family Medicine

## 2020-07-07 NOTE — Progress Notes (Signed)
Sent link to number provided in appointment notes and called several times at the time of appointment. LM on voicemail for pt/wife to call PCP office if they no longer needed appt or needed to reschedule or to go to [facility] if patient needed care urgently. Waited in virtual space until 15 minutes passed appt time.

## 2020-07-08 LAB — VITAMIN B1: Vitamin B1 (Thiamine): 12 nmol/L (ref 8–30)

## 2020-07-08 LAB — DRUG MONITORING, PANEL 8 WITH CONFIRMATION, URINE
6 Acetylmorphine: NEGATIVE ng/mL (ref <10)
Alcohol Metabolites: POSITIVE ng/mL — AB
Alphahydroxyalprazolam: NEGATIVE ng/mL (ref <25)
Alphahydroxymidazolam: NEGATIVE ng/mL (ref <50)
Alphahydroxytriazolam: NEGATIVE ng/mL (ref <50)
Aminoclonazepam: NEGATIVE ng/mL (ref <25)
Benzodiazepines: POSITIVE ng/mL — AB (ref <100)
Buprenorphine, Urine: NEGATIVE ng/mL (ref <5)
Creatinine: 291.2 mg/dL
Ethyl Glucuronide (ETG): 1719 ng/mL — ABNORMAL HIGH (ref <500)
Ethyl Sulfate (ETS): 299 ng/mL — ABNORMAL HIGH (ref <100)
Hydroxyethylflurazepam: NEGATIVE ng/mL (ref <50)
Lorazepam: 3480 ng/mL — ABNORMAL HIGH (ref <50)
MDMA: NEGATIVE ng/mL (ref <500)
Nordiazepam: NEGATIVE ng/mL (ref <50)
Oxazepam: NEGATIVE ng/mL (ref <50)
Oxidant: NEGATIVE ug/mL
Temazepam: NEGATIVE ng/mL (ref <50)
pH: 6.3 (ref 4.5–9.0)

## 2020-07-08 LAB — DM TEMPLATE

## 2020-07-08 LAB — URINALYSIS W MICROSCOPIC + REFLEX CULTURE
Bacteria, UA: NONE SEEN /HPF
Bilirubin Urine: NEGATIVE
Glucose, UA: NEGATIVE
Hgb urine dipstick: NEGATIVE
Hyaline Cast: NONE SEEN /LPF
Leukocyte Esterase: NEGATIVE
Nitrites, Initial: NEGATIVE
Specific Gravity, Urine: 1.018 (ref 1.001–1.03)
Squamous Epithelial / HPF: NONE SEEN /HPF (ref <=5)
WBC, UA: NONE SEEN /HPF (ref 0–5)
pH: 6 (ref 5.0–8.0)

## 2020-07-08 LAB — NO CULTURE INDICATED

## 2020-07-11 ENCOUNTER — Telehealth: Payer: Self-pay | Admitting: Family Medicine

## 2020-07-11 ENCOUNTER — Encounter: Payer: Self-pay | Admitting: Family Medicine

## 2020-07-11 ENCOUNTER — Ambulatory Visit (INDEPENDENT_AMBULATORY_CARE_PROVIDER_SITE_OTHER): Payer: Medicare HMO | Admitting: Family Medicine

## 2020-07-11 ENCOUNTER — Other Ambulatory Visit: Payer: Self-pay

## 2020-07-11 VITALS — BP 153/85 | HR 103 | Temp 98.7°F | Ht 67.0 in | Wt 254.0 lb

## 2020-07-11 DIAGNOSIS — F411 Generalized anxiety disorder: Secondary | ICD-10-CM | POA: Diagnosis not present

## 2020-07-11 DIAGNOSIS — G4700 Insomnia, unspecified: Secondary | ICD-10-CM

## 2020-07-11 DIAGNOSIS — F339 Major depressive disorder, recurrent, unspecified: Secondary | ICD-10-CM | POA: Diagnosis not present

## 2020-07-11 DIAGNOSIS — F0391 Unspecified dementia with behavioral disturbance: Secondary | ICD-10-CM

## 2020-07-11 DIAGNOSIS — G4733 Obstructive sleep apnea (adult) (pediatric): Secondary | ICD-10-CM | POA: Diagnosis not present

## 2020-07-11 DIAGNOSIS — R9431 Abnormal electrocardiogram [ECG] [EKG]: Secondary | ICD-10-CM | POA: Diagnosis not present

## 2020-07-11 DIAGNOSIS — F1320 Sedative, hypnotic or anxiolytic dependence, uncomplicated: Secondary | ICD-10-CM

## 2020-07-11 MED ORDER — MIRTAZAPINE 15 MG PO TABS: 15.0000 mg | ORAL_TABLET | Freq: Every day | ORAL | 11 refills | Status: DC

## 2020-07-11 NOTE — Progress Notes (Signed)
Patient ID: [name] Flesher Sr., male  DOB: 1951/08/23, 69 y.o.   MRN: [id number] Patient Care Team    Relationship Specialty Notifications Start End  [name], DO PCP - General Family Medicine  11/08/17   Troy Sine, MD PCP - Cardiology Cardiology Admissions 02/17/19   [name], MD Consulting Physician Neurology  11/08/17   Clarene Essex, MD Consulting Physician Gastroenterology  11/29/17   Melissa Noon, [place] Referring Physician Optometry  06/30/20   Rod Can, MD Consulting Physician Orthopedic Surgery  06/30/20   Ashley Royalty, [place]  Physician Assistant  06/30/20     Chief Complaint  Patient presents with  . Sleeping Problem    pt wifes states pt is not sleeping; pt is sleeping approx 3-4 hours    Subjective: [name] BLOUCH Sr. is a 69 y.o.  male present for follow up on chronic medical conditions.  He presents with his wife today which adds to the state of his current condition. Major depression, recurrent, chronic (HCC)/Benzodiazepine dependence (HCC)/Insomnia, unspecified type/anxiety: Patient presents today again with his wife after appointment approximately 10 days ago for worsening dementia, depression and anxiety.  His medication regimen was changed to Cymbalta 60 mg daily, Ativan 0.5 mg 3 times daily, Abilify 10 mg daily.  Patient states after making the changes she felt he was doing well initially.  He was more relaxed and seemed more himself.  However, she reports his cousin passed away and since that time she feels he has rapidly declined since her last visit.  He admits to drinking "wine coolers" and liquor/bourbon.  Wife believes he drinks more than he admits during his appointments.  He had a very mildly elevated ammonia level at his last visit. Seroquel was discontinued after last appointment secondary to QTC prolongation.  This medication was not helping him sleep at this dose. Prior note: Pt reports compliance with medications with Seroquel 100 mg QHS,  Ativan 1 mg 3 times daily and Cymbalta 30 mg QD.last visit 6 months ago patient's Prozac was removed and Cymbalta was added secondary to cardiology findings of QTC prolongation.  Patient's wife today states that he is more confused and noticing more memory changes.  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  She feels he has more anxiety and hopes to get better coverage.    Depression screen [facility] 2/9 06/30/2020 12/09/2019 05/26/2019 11/17/2018 05/14/2018  Decreased Interest 3 0 0 0 1  Down, Depressed, Hopeless 3 1 1 1 3   PHQ - 2 Score 6 1 1 1 4   Altered sleeping 3 0 1 1 2   Tired, decreased energy 2 2 1 1  0  Change in appetite 3 1 1  0 0  Feeling bad or failure about yourself  3 0 1 1 1   Trouble concentrating 1 1 1  0 1  Moving slowly or fidgety/restless 2 0 1 0 0  Suicidal thoughts 1 0 0 0 0  PHQ-9 Score 21 5 7 4 8   Difficult doing work/chores - Not difficult at all Somewhat difficult Somewhat difficult Somewhat difficult   GAD 7 : Generalized Anxiety Score 06/30/2020 12/09/2019 11/17/2018 11/08/2017  Nervous, Anxious, on Edge 2 1 1 2   Control/stop worrying 3 0 1 1  Worry too much - different things 3 0 1 1  Trouble relaxing 1 0 1 2  Restless 1 3 1 2   Easily annoyed or irritable 2 3 1 2   Afraid - awful might happen 3 0 1 1  Total GAD 7 Score 15 7 7 11   Anxiety Difficulty - Not difficult at all Somewhat difficult -       Fall Risk  05/26/2019 04/24/2018 04/07/2018 11/08/2017 11/08/2017  Falls in the past year? 1 Yes Yes Yes Yes  Number falls in past yr: 0 2 or more 2 or more 2 or more 2 or more  Injury with Fall? 0 No No Yes Yes  Comment - - - few scratches few scratches  Risk Factor Category  - High Fall Risk High Fall Risk - -  Risk for fall due to : History of fall(s);Impaired vision;Medication side effect - - - -  Follow up Education provided;Falls evaluation completed;Falls prevention discussed Falls prevention discussed Falls prevention discussed - -   Immunization History  Administered Date(s)  Administered  . Influenza, High Dose Seasonal PF 05/14/2018, 05/23/2020  . Influenza-Unspecified 09/10/2017, 05/23/2020  . Moderna SARS-COVID-2 Vaccination 12/25/2019, 01/22/2020  . PFIZER SARS-COV-2 Vaccination 05/23/2020  . Pneumococcal Conjugate-13 05/14/2018  . Pneumococcal Polysaccharide-23 06/29/2015  . Tdap 03/17/2015   No exam data present  Past Medical History:  Diagnosis Date  . Adenomatous colon polyp 2016  . Allergy   . Anxiety   . Bell palsy    resolved  . Chicken pox   . Concussion with no loss of consciousness 07/07/2018  . Depression   . Diet-controlled diabetes mellitus ([place])   . Eating disorder   . GERD (gastroesophageal reflux disease)   . History of vitamin D deficiency   . Hyperlipidemia   . Hypertension   . Hypokalemia   . Memory loss   . MI (myocardial infarction) ([place])    per pt   . Ulcerative colitis ([place]) 2005-2006   severe    Allergies  Allergen Reactions  . Losartan Potassium-Hctz Diarrhea  . Nsaids     On eliquis.   [place] [Sertraline Hcl] Diarrhea   Past Surgical History:  Procedure Laterality Date  . COLONOSCOPY WITH PROPOFOL N/A 07/15/2014   Procedure: COLONOSCOPY WITH PROPOFOL;  Surgeon: [name], MD;  Location: [facility];  Service: Endoscopy;  Laterality: N/A;  . COLONOSCOPY WITH PROPOFOL N/A 08/22/2015   Procedure: COLONOSCOPY WITH PROPOFOL;  Surgeon: [name], MD;  Location: [facility];  Service: Endoscopy;  Laterality: N/A;  . ORIF ANKLE FRACTURE  02/27/2012   Procedure: OPEN REDUCTION INTERNAL FIXATION (ORIF) ANKLE FRACTURE;  Surgeon: Colin Rhein, MD;  Location: [facility];  Service: Orthopedics;  Laterality: Left;  ORIF left bimalleolar ankle fracture  . SHOULDER SURGERY     LEFT  . SIGMOIDOSCOPY  10/08/2018   Externa and Internal Hmorrhoids. Tubular and Tublovillous adenoma removed from transverse colon. Inflammatory pseudopolyps, negative for dysplasia  . TONSILLECTOMY    . UPPER  GASTROINTESTINAL ENDOSCOPY     Family History  Problem Relation Age of Onset  . Dementia Mother   . Hypertension Mother   . Early death Father 60       drowning   . Post-traumatic stress disorder Brother   . Neuropathy Brother    Social History   Socioeconomic History  . Marital status: Married    Spouse name: [name]  . Number of children: 2  . Years of education: Not on file  . Highest education level: Some college, no degree  Occupational History  . Not on file  Tobacco Use  . Smoking status: Former Smoker    Packs/day: 1.00    Years: 19.00    Pack years: 19.00  Quit date: 02/25/1982    Years since quitting: 38.4  . Smokeless tobacco: Former Systems developer    Quit date: Biochemist, clinical  . Vaping Use: Never used  Substance and Sexual Activity  . Alcohol use: Yes    Comment: occ  . Drug use: No  . Sexual activity: Yes    Partners: Female  Other Topics Concern  . Not on file  Social History Narrative   Lives at home with wife [name].   College-educated. Works in Public house manager.    Social Determinants of Health   Financial Resource Strain:   . Difficulty of Paying Living Expenses: Not on file  Food Insecurity:   . Worried About Charity fundraiser in the Last Year: Not on file  . Ran Out of Food in the Last Year: Not on file  Transportation Needs:   . Lack of Transportation (Medical): Not on file  . Lack of Transportation (Non-Medical): Not on file  Physical Activity:   . Days of Exercise per Week: Not on file  . Minutes of Exercise per Session: Not on file  Stress:   . Feeling of Stress : Not on file  Social Connections:   . Frequency of Communication with Friends and Family: Not on file  . Frequency of Social Gatherings with Friends and Family: Not on file  . Attends Religious Services: Not on file  . Active Member of Clubs or Organizations: Not on file  . Attends Archivist Meetings: Not on file  . Marital Status: Not on file    Intimate Partner Violence:   . Fear of Current or Ex-Partner: Not on file  . Emotionally Abused: Not on file  . Physically Abused: Not on file  . Sexually Abused: Not on file   Allergies as of 07/11/2020      Reactions   Losartan Potassium-hctz Diarrhea   Nsaids    On eliquis.    Zoloft [sertraline Hcl] Diarrhea      Medication List       Accurate as of July 11, 2020 11:59 PM. If you have any questions, ask your nurse or doctor.        amLODipine 10 MG tablet Commonly known as: NORVASC Take 0.5 tablets (5 mg total) by mouth daily.   apixaban 5 MG Tabs tablet Commonly known as: ELIQUIS Take 1 tablet (5 mg total) by mouth 2 (two) times daily.   ARIPiprazole 10 MG tablet Commonly known as: Abilify Take 1 tablet (10 mg total) by mouth daily.   atorvastatin 20 MG tablet Commonly known as: LIPITOR Take 1 tablet (20 mg total) by mouth daily.   azaTHIOprine 50 MG tablet Commonly known as: IMURAN Take 200 mg by mouth every morning.   Cyanocobalamin 2500 MCG Tabs 1 tab daily po   dicyclomine 20 MG tablet Commonly known as: BENTYL Take 20 mg by mouth 4 (four) times daily -  before meals and at bedtime.   DULoxetine 60 MG capsule Commonly known as: CYMBALTA Take 1 capsule (60 mg total) by mouth daily.   fluticasone 50 MCG/ACT nasal spray Commonly known as: FLONASE Place 2 sprays into both nostrils daily.   levocetirizine 5 MG tablet Commonly known as: XYZAL Take 0.5 tablets (2.5 mg total) by mouth every evening.   levothyroxine 25 MCG tablet Commonly known as: Synthroid Take 1 tablet (25 mcg total) by mouth daily before breakfast.   loperamide 2 MG tablet Commonly known as: Anti-Diarrheal Take 1 tablet (2  mg total) by mouth daily as needed.   LORazepam 0.5 MG tablet Commonly known as: ATIVAN Take 1 tablet (0.5 mg total) by mouth 3 (three) times daily.   metoprolol tartrate 25 MG tablet Commonly known as: LOPRESSOR Take 1 tablet (25 mg total) by mouth  2 (two) times daily.   mirtazapine 15 MG tablet Commonly known as: REMERON Take 1 tablet (15 mg total) by mouth at bedtime. Started by: Howard Pouch, DO   pantoprazole 20 MG tablet Commonly known as: PROTONIX Take 1 tablet (20 mg total) by mouth daily.   potassium chloride 10 MEQ tablet Commonly known as: Klor-Con M10 Take 1 tablet (10 mEq total) by mouth daily.   sitaGLIPtin 25 MG tablet Commonly known as: Januvia Take 1 tablet (25 mg total) by mouth daily.       All past medical history, surgical history, allergies, family history, immunizations andmedications were updated in the EMR today and reviewed under the history and medication portions of their EMR.     ROS: 14 pt review of systems performed and negative (unless mentioned in an HPI)  Objective: BP (!) 153/85   Pulse (!) 103   Temp 98.7 F (37.1 C) (Oral)   Ht 5' 7"  (1.702 m)   Wt 254 lb (115.2 kg)   SpO2 97%   BMI 39.78 kg/m  Gen: Afebrile. No acute distress.  Nontoxic in appearance. HENT: AT. [place].  Eyes:Pupils Equal Round Reactive to light, Extraocular movements intact,  Conjunctiva without redness, discharge or icterus. Neck/lymp/endocrine: Supple, no lymphadenopathy, no thyromegaly  Neuro:  Normal gait. PERLA. EOMi. Alert. Oriented x3  Psych: Talkative.  Good eye contact.  Normal dress.  Mildly slowed speech.  Repeats stories without realizing.  Does not recall events wife states that he has done within the last 2 days.  Assessment/plan: [name] Flesher Sr. is a 69 y.o. male present for  Dementia with behavioral disturbance: Dementia/cognitive decline seems to have worsened over the last few weeks per patient's wife.  In medication review, he has not been compliant with either his metoprolol or his Eliquis-he is uncertain how long he was not taking this medication but it is not in his pillbox.  We restarted this medication at his last appointment 10 days ago..  Therefore cannot rule out stroke as a cause of his  acutely worsening dementia and confusion.    -MRI brain ordered-not completed -CBC, CMP, TSH, T4 free, B1 and magnesium normal.   - vitamin D mildly insufficient, B12 insufficiency at 179, B1-normal,  -  ammonia-mildly elevated 38 -Resources were discussed with patient's wife today and emergency services if needed to help care for her husband.  major depression/anxiety/insomnia/QT prolongation -Since he is still not sleeping can try Remeron 15 mg nightly. - Continue Abilify 10 mg QD -Continue Cymbalta 60 mg daily. -Continue Ativan  To 0.5 mg TID> will attempt to wean off if able.  Concern of his alcohol consumption with medication use. -Tried: seroquel and Prozac- not working well and QT prolongation.  Trazodone not effective . -  Patient is aware of controlled substance policy.  He signed a controlled substance agreement.   - [facility] database reviewed 07/13/20 and appropriate.  - UDS: collected last visit with "interference "and positive benzos - Advised No etoh> he is drinking.  He & his wife were counseled on effects of alcohol on his prescribed medications. He was strongly encouraged to stop drinking.  - he will benefit from referral to psychiatry> referral placed last visit.  He had been referred in the past and did not establish. Pt and his wife encouraged to establish.    OSA: Patient reports he returned the CPAP machine bc he could not tolerate mask.  Lengthy conversation today with patient and his wife surrounding the absolute need of CPAP nightly.  Neurology has worked with him and attempts to find a way to make him more comfortable with the CPAP machine.  He still has not been able to tolerate.  Advised him today he may not be comfortable wearing the CPAP machine, but this is something he is going to have to come to terms with needing to do despite it being uncomfortable.  Without CPAP his dementia will worsen.  He states he will do whatever is needed, and including wear CPAP. He has  been referred back to neurology at his last visit-they report they have not heard anything from that office as of yet..    No follow-ups on file.  No orders of the defined types were placed in this encounter.   Meds ordered this encounter  Medications  . mirtazapine (REMERON) 15 MG tablet    Sig: Take 1 tablet (15 mg total) by mouth at bedtime.    Dispense:  30 tablet    Refill:  11   Referral Orders  No referral(s) requested today     Note is dictated utilizing voice recognition software. Although note has been proof read prior to signing, occasional typographical errors still can be missed. If any questions arise, please do not hesitate to call for verification.  Electronically signed by: Howard Pouch, DO [place]

## 2020-07-11 NOTE — Telephone Encounter (Signed)
Wife calling to say pt is not sleeping well at all. He is up all night and barely sleeps during the day. His cousin has recently passed away and since then he is moving and very agitated. Has started packing things routinely too. Wife would like something to help with the agitation and help him sleep.

## 2020-07-11 NOTE — Patient Instructions (Addendum)
[name] ED if worsening or any concerns. They can admit to [facility] for observation if needed.  You can EMS if he does not want to go willing.   If having any chest pain he should go to ED or call his cardiologist.   He will need to get his cpap from neurology.   He will need to follow w/psychiatry once they call to get him established.    Major Depressive Disorder, Adult Major depressive disorder (MDD) is a mental health condition. MDD often makes you feel sad, hopeless, or helpless. MDD can also cause symptoms in your body. MDD can affect your:  Work.  School.  Relationships.  Other normal activities. MDD can range from mild to very bad. It may occur once (single episode MDD). It can also occur many times (recurrent MDD). The main symptoms of MDD often include:  Feeling sad, depressed, or irritable most of the time.  Loss of interest. MDD symptoms also include:  Sleeping too much or too little.  Eating too much or too little.  A change in your weight.  Feeling tired (fatigue) or having low energy.  Feeling worthless.  Feeling guilty.  Trouble making decisions.  Trouble thinking clearly.  Thoughts of suicide or harming others.  Feeling weak.  Feeling agitated.  Keeping yourself from being around other people (isolation). Follow these instructions at home: Activity  Do these things as told by your doctor: ? Go back to your normal activities. ? Exercise regularly. ? Spend time outdoors. Alcohol  Talk with your doctor about how alcohol can affect your antidepressant medicines.  Do not drink alcohol. Or, limit how much alcohol you drink. ? This means no more than 1 drink a day for nonpregnant women and 2 drinks a day for men. One drink equals one of these:  12 oz of beer.  5 oz of wine.  1 oz of hard liquor. General instructions  Take over-the-counter and prescription medicines only as told by your doctor.  Eat a healthy  diet.  Get plenty of sleep.  Find activities that you enjoy. Make time to do them.  Think about joining a support group. Your doctor may be able to suggest a group for you.  Keep all follow-up visits as told by your doctor. This is important. Where to find more information:  [employer] on Mental Illness: ? www.nami.[place]: ? https://carter.com/  National Suicide Prevention Lifeline: ? 213-230-4192. This is free, 24-hour help. Contact a doctor if:  Your symptoms get worse.  You have new symptoms. Get help right away if:  You self-harm.  You see, hear, taste, smell, or feel things that are not present (hallucinate). If you ever feel like you may hurt yourself or others, or have thoughts about taking your own life, get help right away. You can go to your nearest emergency department or call:  Your local emergency services (911 in the [place]  A suicide crisis helpline, such as the National Suicide Prevention Lifeline: ? (616) 295-1490. This is open 24 hours a day. This information is not intended to replace advice given to you by your health care provider. Make sure you discuss any questions you have with your health care provider. Document Revised: 08/09/2017 Document Reviewed: 05/13/2016 Elsevier Patient Education  2020 [employer].

## 2020-07-11 NOTE — Telephone Encounter (Signed)
Pt sched for appt at 130 today

## 2020-07-13 ENCOUNTER — Encounter (HOSPITAL_COMMUNITY): Payer: Self-pay | Admitting: Emergency Medicine

## 2020-07-13 ENCOUNTER — Telehealth: Payer: Self-pay | Admitting: Family Medicine

## 2020-07-13 ENCOUNTER — Emergency Department (HOSPITAL_COMMUNITY): Payer: Medicare HMO

## 2020-07-13 ENCOUNTER — Other Ambulatory Visit: Payer: Self-pay

## 2020-07-13 ENCOUNTER — Inpatient Hospital Stay (HOSPITAL_COMMUNITY)
Admission: EM | Admit: 2020-07-13 | Discharge: 2020-09-09 | DRG: 884 | Disposition: A | Discharge status: Home Health | Payer: Medicare HMO | Attending: Family Medicine | Admitting: Family Medicine

## 2020-07-13 DIAGNOSIS — R339 Retention of urine, unspecified: Secondary | ICD-10-CM | POA: Diagnosis not present

## 2020-07-13 DIAGNOSIS — I252 Old myocardial infarction: Secondary | ICD-10-CM | POA: Diagnosis not present

## 2020-07-13 DIAGNOSIS — K219 Gastro-esophageal reflux disease without esophagitis: Secondary | ICD-10-CM | POA: Diagnosis present

## 2020-07-13 DIAGNOSIS — Z886 Allergy status to analgesic agent status: Secondary | ICD-10-CM

## 2020-07-13 DIAGNOSIS — E559 Vitamin D deficiency, unspecified: Secondary | ICD-10-CM | POA: Diagnosis present

## 2020-07-13 DIAGNOSIS — I482 Chronic atrial fibrillation, unspecified: Secondary | ICD-10-CM

## 2020-07-13 DIAGNOSIS — R4182 Altered mental status, unspecified: Secondary | ICD-10-CM | POA: Diagnosis present

## 2020-07-13 DIAGNOSIS — Z8249 Family history of ischemic heart disease and other diseases of the circulatory system: Secondary | ICD-10-CM

## 2020-07-13 DIAGNOSIS — J90 Pleural effusion, not elsewhere classified: Secondary | ICD-10-CM | POA: Diagnosis not present

## 2020-07-13 DIAGNOSIS — Z79890 Hormone replacement therapy (postmenopausal): Secondary | ICD-10-CM

## 2020-07-13 DIAGNOSIS — R443 Hallucinations, unspecified: Secondary | ICD-10-CM | POA: Diagnosis not present

## 2020-07-13 DIAGNOSIS — R000 Tachycardia, unspecified: Secondary | ICD-10-CM | POA: Diagnosis present

## 2020-07-13 DIAGNOSIS — R059 Cough, unspecified: Secondary | ICD-10-CM

## 2020-07-13 DIAGNOSIS — Z634 Disappearance and death of family member: Secondary | ICD-10-CM

## 2020-07-13 DIAGNOSIS — F339 Major depressive disorder, recurrent, unspecified: Secondary | ICD-10-CM | POA: Diagnosis not present

## 2020-07-13 DIAGNOSIS — Z781 Physical restraint status: Secondary | ICD-10-CM

## 2020-07-13 DIAGNOSIS — B372 Candidiasis of skin and nail: Secondary | ICD-10-CM | POA: Diagnosis present

## 2020-07-13 DIAGNOSIS — F05 Delirium due to known physiological condition: Secondary | ICD-10-CM | POA: Diagnosis not present

## 2020-07-13 DIAGNOSIS — G4733 Obstructive sleep apnea (adult) (pediatric): Secondary | ICD-10-CM | POA: Diagnosis present

## 2020-07-13 DIAGNOSIS — Z66 Do not resuscitate: Secondary | ICD-10-CM | POA: Diagnosis present

## 2020-07-13 DIAGNOSIS — R456 Violent behavior: Secondary | ICD-10-CM | POA: Diagnosis not present

## 2020-07-13 DIAGNOSIS — F0390 Unspecified dementia without behavioral disturbance: Secondary | ICD-10-CM | POA: Diagnosis not present

## 2020-07-13 DIAGNOSIS — K5190 Ulcerative colitis, unspecified, without complications: Secondary | ICD-10-CM | POA: Diagnosis present

## 2020-07-13 DIAGNOSIS — I4891 Unspecified atrial fibrillation: Secondary | ICD-10-CM | POA: Diagnosis present

## 2020-07-13 DIAGNOSIS — F0391 Unspecified dementia with behavioral disturbance: Principal | ICD-10-CM | POA: Diagnosis present

## 2020-07-13 DIAGNOSIS — E039 Hypothyroidism, unspecified: Secondary | ICD-10-CM | POA: Diagnosis not present

## 2020-07-13 DIAGNOSIS — R451 Restlessness and agitation: Secondary | ICD-10-CM

## 2020-07-13 DIAGNOSIS — Z79899 Other long term (current) drug therapy: Secondary | ICD-10-CM

## 2020-07-13 DIAGNOSIS — J302 Other seasonal allergic rhinitis: Secondary | ICD-10-CM | POA: Diagnosis not present

## 2020-07-13 DIAGNOSIS — G4700 Insomnia, unspecified: Secondary | ICD-10-CM | POA: Diagnosis present

## 2020-07-13 DIAGNOSIS — Z6841 Body Mass Index (BMI) 40.0 and over, adult: Secondary | ICD-10-CM | POA: Diagnosis not present

## 2020-07-13 DIAGNOSIS — I10 Essential (primary) hypertension: Secondary | ICD-10-CM | POA: Diagnosis present

## 2020-07-13 DIAGNOSIS — I6782 Cerebral ischemia: Secondary | ICD-10-CM | POA: Diagnosis not present

## 2020-07-13 DIAGNOSIS — E871 Hypo-osmolality and hyponatremia: Secondary | ICD-10-CM | POA: Diagnosis present

## 2020-07-13 DIAGNOSIS — R410 Disorientation, unspecified: Secondary | ICD-10-CM | POA: Diagnosis not present

## 2020-07-13 DIAGNOSIS — E876 Hypokalemia: Secondary | ICD-10-CM | POA: Diagnosis present

## 2020-07-13 DIAGNOSIS — F411 Generalized anxiety disorder: Secondary | ICD-10-CM | POA: Diagnosis not present

## 2020-07-13 DIAGNOSIS — B029 Zoster without complications: Secondary | ICD-10-CM | POA: Diagnosis present

## 2020-07-13 DIAGNOSIS — Z20822 Contact with and (suspected) exposure to covid-19: Secondary | ICD-10-CM | POA: Diagnosis present

## 2020-07-13 DIAGNOSIS — R9431 Abnormal electrocardiogram [ECG] [EKG]: Secondary | ICD-10-CM | POA: Diagnosis not present

## 2020-07-13 DIAGNOSIS — R630 Anorexia: Secondary | ICD-10-CM | POA: Diagnosis not present

## 2020-07-13 DIAGNOSIS — F319 Bipolar disorder, unspecified: Secondary | ICD-10-CM | POA: Diagnosis present

## 2020-07-13 DIAGNOSIS — I517 Cardiomegaly: Secondary | ICD-10-CM | POA: Diagnosis not present

## 2020-07-13 DIAGNOSIS — G319 Degenerative disease of nervous system, unspecified: Secondary | ICD-10-CM | POA: Diagnosis not present

## 2020-07-13 DIAGNOSIS — E119 Type 2 diabetes mellitus without complications: Secondary | ICD-10-CM | POA: Diagnosis present

## 2020-07-13 DIAGNOSIS — Z9183 Wandering in diseases classified elsewhere: Secondary | ICD-10-CM

## 2020-07-13 DIAGNOSIS — E538 Deficiency of other specified B group vitamins: Secondary | ICD-10-CM | POA: Diagnosis not present

## 2020-07-13 DIAGNOSIS — I700 Atherosclerosis of aorta: Secondary | ICD-10-CM | POA: Diagnosis present

## 2020-07-13 DIAGNOSIS — R400 Somnolence: Secondary | ICD-10-CM | POA: Diagnosis not present

## 2020-07-13 DIAGNOSIS — E669 Obesity, unspecified: Secondary | ICD-10-CM | POA: Diagnosis not present

## 2020-07-13 DIAGNOSIS — Z7901 Long term (current) use of anticoagulants: Secondary | ICD-10-CM

## 2020-07-13 DIAGNOSIS — Z6833 Body mass index (BMI) 33.0-33.9, adult: Secondary | ICD-10-CM

## 2020-07-13 DIAGNOSIS — Z87891 Personal history of nicotine dependence: Secondary | ICD-10-CM

## 2020-07-13 DIAGNOSIS — Z6836 Body mass index (BMI) 36.0-36.9, adult: Secondary | ICD-10-CM

## 2020-07-13 DIAGNOSIS — Z789 Other specified health status: Secondary | ICD-10-CM

## 2020-07-13 DIAGNOSIS — Z888 Allergy status to other drugs, medicaments and biological substances status: Secondary | ICD-10-CM

## 2020-07-13 HISTORY — DX: Altered mental status, unspecified: R41.82

## 2020-07-13 LAB — CBC WITH DIFFERENTIAL/PLATELET
Abs Immature Granulocytes: 0.09 10*3/uL — ABNORMAL HIGH (ref 0.00–0.07)
Basophils Absolute: 0.1 10*3/uL (ref 0.0–0.1)
Basophils Relative: 1 %
Eosinophils Absolute: 0.1 10*3/uL (ref 0.0–0.5)
Eosinophils Relative: 2 %
HCT: 42 % (ref 39.0–52.0)
Hemoglobin: 14.4 g/dL (ref 13.0–17.0)
Immature Granulocytes: 1 %
Lymphocytes Relative: 8 %
Lymphs Abs: 0.5 10*3/uL — ABNORMAL LOW (ref 0.7–4.0)
MCH: 33.6 pg (ref 26.0–34.0)
MCHC: 34.3 g/dL (ref 30.0–36.0)
MCV: 97.9 fL (ref 80.0–100.0)
Monocytes Absolute: 0.5 10*3/uL (ref 0.1–1.0)
Monocytes Relative: 8 %
Neutro Abs: 5.7 10*3/uL (ref 1.7–7.7)
Neutrophils Relative %: 80 %
Platelets: 206 10*3/uL (ref 150–400)
RBC: 4.29 MIL/uL (ref 4.22–5.81)
RDW: 15.1 % (ref 11.5–15.5)
WBC: 7 10*3/uL (ref 4.0–10.5)
nRBC: 0 % (ref 0.0–0.2)

## 2020-07-13 LAB — RESPIRATORY PANEL BY RT PCR (FLU A&B, COVID)
Influenza A by PCR: NEGATIVE
Influenza B by PCR: NEGATIVE
SARS Coronavirus 2 by RT PCR: NEGATIVE

## 2020-07-13 LAB — COMPREHENSIVE METABOLIC PANEL
ALT: 21 U/L (ref 0–44)
AST: 32 U/L (ref 15–41)
Albumin: 4.3 g/dL (ref 3.5–5.0)
Alkaline Phosphatase: 52 U/L (ref 38–126)
Anion gap: 10 (ref 5–15)
BUN: 6 mg/dL — ABNORMAL LOW (ref 8–23)
CO2: 28 mmol/L (ref 22–32)
Calcium: 9.1 mg/dL (ref 8.9–10.3)
Chloride: 96 mmol/L — ABNORMAL LOW (ref 98–111)
Creatinine, Ser: 1.1 mg/dL (ref 0.61–1.24)
GFR, Estimated: >60 mL/min (ref >60)
Glucose, Bld: 142 mg/dL — ABNORMAL HIGH (ref 70–99)
Potassium: 3.7 mmol/L (ref 3.5–5.1)
Sodium: 134 mmol/L — ABNORMAL LOW (ref 135–145)
Total Bilirubin: 0.8 mg/dL (ref 0.3–1.2)
Total Protein: 6.7 g/dL (ref 6.5–8.1)

## 2020-07-13 LAB — MAGNESIUM: Magnesium: 2 mg/dL (ref 1.7–2.4)

## 2020-07-13 LAB — GLUCOSE, CAPILLARY: Glucose-Capillary: 112 mg/dL — ABNORMAL HIGH (ref 70–99)

## 2020-07-13 LAB — LIPASE, BLOOD: Lipase: 30 U/L (ref 11–51)

## 2020-07-13 LAB — URINALYSIS, ROUTINE W REFLEX MICROSCOPIC
Bilirubin Urine: NEGATIVE
Glucose, UA: NEGATIVE mg/dL
Hgb urine dipstick: NEGATIVE
Ketones, ur: NEGATIVE mg/dL
Leukocytes,Ua: NEGATIVE
Nitrite: NEGATIVE
Protein, ur: NEGATIVE mg/dL
Specific Gravity, Urine: 1.008 (ref 1.005–1.030)
pH: 6 (ref 5.0–8.0)

## 2020-07-13 LAB — TROPONIN I (HIGH SENSITIVITY)
Troponin I (High Sensitivity): 5 ng/L (ref <18)
Troponin I (High Sensitivity): 5 ng/L (ref <18)

## 2020-07-13 LAB — ETHANOL: Alcohol, Ethyl (B): <10 mg/dL (ref <10)

## 2020-07-13 MED ORDER — LEVOTHYROXINE SODIUM 25 MCG PO TABS
25.0000 ug | ORAL_TABLET | Freq: Every day | ORAL | Status: DC
  Administered 2020-07-14 – 2020-08-31 (×48): 25 ug via ORAL
  Filled 2020-07-13 (×48): qty 1

## 2020-07-13 MED ORDER — INSULIN ASPART 100 UNIT/ML ~~LOC~~ SOLN
0.0000 [IU] | Freq: Every day | SUBCUTANEOUS | Status: DC
  Filled 2020-07-13: qty 0.05

## 2020-07-13 MED ORDER — LEVOTHYROXINE SODIUM 25 MCG PO TABS: 25.0000 ug | ORAL_TABLET | Freq: Every day | ORAL | Status: DC

## 2020-07-13 MED ORDER — ATORVASTATIN CALCIUM 20 MG PO TABS
20.0000 mg | ORAL_TABLET | Freq: Every day | ORAL | Status: DC
  Administered 2020-07-13 – 2020-09-09 (×58): 20 mg via ORAL
  Filled 2020-07-13 (×6): qty 1
  Filled 2020-07-13: qty 2
  Filled 2020-07-13 (×4): qty 1
  Filled 2020-07-13: qty 2
  Filled 2020-07-13 (×26): qty 1
  Filled 2020-07-13: qty 2
  Filled 2020-07-13 (×13): qty 1
  Filled 2020-07-13: qty 2
  Filled 2020-07-13 (×5): qty 1

## 2020-07-13 MED ORDER — LORAZEPAM 2 MG/ML IJ SOLN: 1.0000 mg | Freq: Four times a day (QID) | INTRAMUSCULAR | Status: DC | PRN

## 2020-07-13 MED ORDER — AMLODIPINE BESYLATE 5 MG PO TABS
5.0000 mg | ORAL_TABLET | Freq: Every day | ORAL | Status: DC
  Administered 2020-07-13 – 2020-09-01 (×50): 5 mg via ORAL
  Filled 2020-07-13 (×51): qty 1

## 2020-07-13 MED ORDER — LORAZEPAM 2 MG/ML IJ SOLN
2.0000 mg | Freq: Once | INTRAMUSCULAR | Status: AC
  Administered 2020-07-13: 2 mg via INTRAVENOUS
  Filled 2020-07-13: qty 1

## 2020-07-13 MED ORDER — APIXABAN 5 MG PO TABS
5.0000 mg | ORAL_TABLET | Freq: Two times a day (BID) | ORAL | Status: DC
  Administered 2020-07-13 – 2020-09-09 (×115): 5 mg via ORAL
  Filled 2020-07-13 (×117): qty 1

## 2020-07-13 MED ORDER — ACETAMINOPHEN 325 MG PO TABS
650.0000 mg | ORAL_TABLET | Freq: Four times a day (QID) | ORAL | Status: DC | PRN
  Administered 2020-07-14 – 2020-09-07 (×13): 650 mg via ORAL
  Filled 2020-07-13 (×14): qty 2

## 2020-07-13 MED ORDER — INSULIN ASPART 100 UNIT/ML ~~LOC~~ SOLN
0.0000 [IU] | Freq: Three times a day (TID) | SUBCUTANEOUS | Status: DC
  Administered 2020-07-14: 2 [IU] via SUBCUTANEOUS
  Administered 2020-07-14 – 2020-07-20 (×9): 1 [IU] via SUBCUTANEOUS
  Administered 2020-07-21: 2 [IU] via SUBCUTANEOUS
  Administered 2020-07-22 – 2020-07-26 (×5): 1 [IU] via SUBCUTANEOUS
  Administered 2020-07-29: 2 [IU] via SUBCUTANEOUS
  Administered 2020-07-30: 1 [IU] via SUBCUTANEOUS
  Administered 2020-07-31: 2 [IU] via SUBCUTANEOUS
  Administered 2020-08-01 – 2020-08-02 (×5): 1 [IU] via SUBCUTANEOUS
  Administered 2020-08-06: 2 [IU] via SUBCUTANEOUS
  Administered 2020-08-08 – 2020-08-09 (×3): 1 [IU] via SUBCUTANEOUS
  Administered 2020-08-10: 2 [IU] via SUBCUTANEOUS
  Administered 2020-08-11 – 2020-08-14 (×3): 1 [IU] via SUBCUTANEOUS
  Administered 2020-08-14: 2 [IU] via SUBCUTANEOUS
  Administered 2020-08-15: 0 [IU] via SUBCUTANEOUS
  Administered 2020-08-15 – 2020-08-16 (×2): 1 [IU] via SUBCUTANEOUS
  Administered 2020-08-17: 2 [IU] via SUBCUTANEOUS
  Administered 2020-08-19 – 2020-08-26 (×7): 1 [IU] via SUBCUTANEOUS
  Filled 2020-07-13: qty 0.09

## 2020-07-13 MED ORDER — HYDROGEN PEROXIDE 3 % EX SOLN
CUTANEOUS | Status: AC
  Filled 2020-07-13: qty 473

## 2020-07-13 MED ORDER — MIRTAZAPINE 15 MG PO TABS
15.0000 mg | ORAL_TABLET | Freq: Every day | ORAL | Status: DC
  Administered 2020-07-13 – 2020-07-16 (×4): 15 mg via ORAL
  Filled 2020-07-13 (×4): qty 1

## 2020-07-13 MED ORDER — METOPROLOL TARTRATE 25 MG PO TABS
25.0000 mg | ORAL_TABLET | Freq: Two times a day (BID) | ORAL | Status: DC
  Administered 2020-07-13 – 2020-07-14 (×3): 25 mg via ORAL
  Filled 2020-07-13 (×3): qty 1

## 2020-07-13 MED ORDER — DULOXETINE HCL 60 MG PO CPEP
60.0000 mg | ORAL_CAPSULE | Freq: Every day | ORAL | Status: DC
  Administered 2020-07-13 – 2020-09-09 (×58): 60 mg via ORAL
  Filled 2020-07-13 (×48): qty 1
  Filled 2020-07-13: qty 2
  Filled 2020-07-13 (×9): qty 1

## 2020-07-13 MED ORDER — ACETAMINOPHEN 650 MG RE SUPP: 650.0000 mg | Freq: Four times a day (QID) | RECTAL | Status: DC | PRN

## 2020-07-13 NOTE — ED Notes (Signed)
Pt asleep with eyes closed. Even and unlabored respirations. Wife at bedside.

## 2020-07-13 NOTE — ED Notes (Signed)
Edwin Cap, RN Case Manager speaking with wife.

## 2020-07-13 NOTE — Telephone Encounter (Signed)
Please advise 

## 2020-07-13 NOTE — Telephone Encounter (Signed)
They presented because he had a decline since his family member passed. We started the nighttime med then. If She is seeing more decline in him her options are cut the night time pill in half, discontinue it and/or seek treatment with psychiatry or [facility] ED if felt needed.  I have concerns if he is drinking alcohol also with these meds he could be suffering from alcohol encephalopathy which can cause all the same type of symptoms and this needs to be evaluated in the hospital setting.  I know this is hard on her. I am sorry they are going through this.   Please also giver her resources for [facility], which can be easier to get into. tel:[phone]  Please also check on the psychiatry referral we placed.   His condition has progressed outside the scope of family medicine and we need to get him established with a psychiatry team.

## 2020-07-13 NOTE — ED Notes (Signed)
Pt states "I am going back to [place], [place] and you cannot stop me." Security called, pt was redirected easily by security. Pt willingly walked back into room.

## 2020-07-13 NOTE — ED Notes (Signed)
Sitter at bedside.

## 2020-07-13 NOTE — TOC Initial Note (Signed)
Transition of Care (TOC) - Initial/Assessment Note    Patient Details  Name: [name] ROANHORSE Sr. MRN: [id number] Date of Birth: [birth year]  Transition of Care [facility]) CM/SW Contact:    Erenest Rasher, RN Phone Number: [phone] 07/13/2020, 6:03 PM  Clinical Narrative:                 TOC CM spoke to pt's wife, [name]. Wife states she is does not have finances to pay for Memory Care/ALF. States she is willing to take pt home but she will like [place]. Explained pt will need agency that has Psych [facility] that can come and see pt. States she works but feels she may retire early. She has a caregiver that stays with her mother from 9-5 pm while she works. She has 2 sons that are very supportive. Will check with HH to see if they have Psych HHRN. Contacted [facility], Ronalee Belts. Waiting call back.   Expected Discharge Plan: [place] Barriers to Discharge: Continued Medical Work up   Patient Goals and CMS Choice Patient states their goals for this hospitalization and ongoing recovery are:: wife states she will be willing to take patient home CMS Medicare.gov Compare Post Acute Care list provided to:: Patient Represenative (must comment) (wife [facility]) Choice offered to / list presented to : Spouse  Expected Discharge Plan and Services Expected Discharge Plan: [place] In-house Referral: Clinical Social Work Discharge Planning Services: CM Consult Post Acute Care Choice: [place] arrangements for the past 2 months: Single Family Home                                      Prior Living Arrangements/Services Living arrangements for the past 2 months: Single Family Home Lives with:: Spouse   Do you feel safe going back to the place where you live?: Yes               Activities of Daily Living Home Assistive Devices/Equipment: CBG Meter, Eyeglasses ADL Screening (condition at time of admission) Patient's cognitive ability  adequate to safely complete daily activities?: No Is the patient deaf or have difficulty hearing?: No Does the patient have difficulty seeing, even when wearing glasses/contacts?: No Does the patient have difficulty concentrating, remembering, or making decisions?: Yes Patient able to express need for assistance with ADLs?: No Does the patient have difficulty dressing or bathing?: No Independently performs ADLs?: Yes (appropriate for developmental age) Does the patient have difficulty walking or climbing stairs?: Yes (secondary to arthritis in his knees) Weakness of Legs: Both Weakness of Arms/Hands: None  Permission Sought/Granted Permission sought to share information with : Case Manager, PCP, Family Supports                Emotional Assessment       Orientation: : Oriented to Self   Psych Involvement: No (comment)  Admission diagnosis:  AMS (altered mental status) [R41.82] Patient Active Problem List   Diagnosis Date Noted  . AMS (altered mental status) 07/13/2020  . On statin therapy 05/26/2019  . Chronic anticoagulation 05/26/2019  . QT prolongation 02/19/2019  . Aortic atherosclerosis ([place]) 02/18/2019  . Carotid artery calcification 02/18/2019  . Cardiomegaly 02/18/2019  . Dementia with behavioral disturbance ([place]) 02/17/2019  . Atrial fibrillation with rapid ventricular response ([place]) 02/17/2019  . Hypokalemia 02/17/2019  . GAD (generalized anxiety  disorder) 05/14/2018  . Benzodiazepine dependence ([place]) 11/29/2017  . Benzodiazepine contract exists 11/29/2017  . Essential hypertension 11/29/2017  . Insomnia 11/29/2017  . Ulcerative colitis with complication ([place]) 03/88/8280  . Moderate obstructive sleep apnea 11/08/2017  . Class 3 severe obesity due to excess calories with body mass index (BMI) of 40.0 to 44.9 in adult ([place]) 11/08/2017  . Major depression, recurrent, chronic ([place]) 09/18/2017   PCP:  [name], DO Pharmacy:   [id number]  [place], [place] - [address] [place] [id number]: [phone]: [phone]    Social Determinants of Health (SDOH) Interventions    Readmission Risk Interventions Readmission Risk Prevention Plan 02/18/2019  Transportation Screening Complete  PCP or Specialist Appt within 3-5 Days Complete  HRI or [place]  Social Work Consult for [place]/Counseling Complete  Palliative Care Screening Not Applicable  Medication Review (Press photographer Complete  Some recent data might be hidden

## 2020-07-13 NOTE — ED Triage Notes (Signed)
Pt BIBA from home-  Per EMS- Son called EMS and reports pt has suddenly had mental change/behavioral disturbance last week and has progressively gotten worse over the last week.  Pt had recent med change (last week), now has worsening confusion, more aggressive behavior.  Pt has hx of dementia.  Ambulatory on scene. Cooperative with EMS.   148/  110 irregular/ hx of afib 96% RA 145 cbg

## 2020-07-13 NOTE — H&P (Signed)
History and Physical    [name] LOV:[id number] DOB: [birth year] DOA: 07/13/2020  PCP: [name], DO  Patient coming from: Home  Chief Complaint: AMS  HPI: [name] Flesher Sr. is a 69 y.o. male with medical history significant of a fib, dementia, depression. Presents with increasing agitation over last week. Hx is per wife at bedside. She reports that [name] has had worsening dementia over the last 6 months. He has been following with his PCP about this issue. Different medications were tried -- including cymbalta, prozac, and abilify -- but nothing seems to have helped the decline. She reports that he has been found wandering the neighborhood at night with a flash light. [place] was [place] he was going. His family has had to take away his keys so he can't drive anywhere. He recently had a relative died, and it seems as if he decline has worsened. His wife is unable to take care of him at home at this point as she is also trying to take care of her mother with dementia. She is concerned about everyone's safety at this time, so she brought [name] to the ED.   ED Course: CTH was negative fro acute change. Negative infectious work up. Patient remains confused and agitated. He was IVC'd by the ED. TRH was called for admission.   Review of Systems:  Denies CP, palpitation, HA, N, V, D. Review of systems is otherwise negative for all not mentioned in HPI.   PMHx Past Medical History:  Diagnosis Date  . Adenomatous colon polyp 2016  . Allergy   . Anxiety   . Bell palsy    resolved  . Chicken pox   . Concussion with no loss of consciousness 07/07/2018  . Depression   . Diet-controlled diabetes mellitus ([place])   . Eating disorder   . GERD (gastroesophageal reflux disease)   . History of vitamin D deficiency   . Hyperlipidemia   . Hypertension   . Hypokalemia   . Memory loss   . MI (myocardial infarction) ([place])    per pt   . Ulcerative  colitis ([place]) 2005-2006   severe     PSHx Past Surgical History:  Procedure Laterality Date  . COLONOSCOPY WITH PROPOFOL N/A 07/15/2014   Procedure: COLONOSCOPY WITH PROPOFOL;  Surgeon: [name], MD;  Location: [facility];  Service: Endoscopy;  Laterality: N/A;  . COLONOSCOPY WITH PROPOFOL N/A 08/22/2015   Procedure: COLONOSCOPY WITH PROPOFOL;  Surgeon: [name], MD;  Location: [facility];  Service: Endoscopy;  Laterality: N/A;  . ORIF ANKLE FRACTURE  02/27/2012   Procedure: OPEN REDUCTION INTERNAL FIXATION (ORIF) ANKLE FRACTURE;  Surgeon: Colin Rhein, MD;  Location: [place];  Service: Orthopedics;  Laterality: Left;  ORIF left bimalleolar ankle fracture  . SHOULDER SURGERY     LEFT  . SIGMOIDOSCOPY  10/08/2018   Externa and Internal Hmorrhoids. Tubular and Tublovillous adenoma removed from transverse colon. Inflammatory pseudopolyps, negative for dysplasia  . TONSILLECTOMY    . UPPER GASTROINTESTINAL ENDOSCOPY      SocHx  reports that he quit smoking about 38 years ago. He has a 19.00 pack-year smoking history. He quit smokeless tobacco use about 31 years ago. He reports current alcohol use. He reports that he does not use drugs.  Allergies  Allergen Reactions  . Losartan Potassium-Hctz Diarrhea  . Nsaids     On eliquis.   [place] [Sertraline Hcl]  Diarrhea    FamHx Family History  Problem Relation Age of Onset  . Dementia Mother   . Hypertension Mother   . Early death Father 55       drowning   . Post-traumatic stress disorder Brother   . Neuropathy Brother     Prior to Admission medications   Medication Sig Start Date End Date Taking? Authorizing Provider  amLODipine (NORVASC) 10 MG tablet Take 0.5 tablets (5 mg total) by mouth daily. 06/30/20   [name], [name], DO  apixaban (ELIQUIS) 5 MG TABS tablet Take 1 tablet (5 mg total) by mouth 2 (two) times daily. 06/30/20 06/25/21  [name], [name], DO  ARIPiprazole (ABILIFY) 10 MG  tablet Take 1 tablet (10 mg total) by mouth daily. 07/01/20   [name], [name], DO  atorvastatin (LIPITOR) 20 MG tablet Take 1 tablet (20 mg total) by mouth daily. 06/30/20   [name], [name], DO  azaTHIOprine (IMURAN) 50 MG tablet Take 200 mg by mouth every morning.     [provider]  Cyanocobalamin 2500 MCG TABS 1 tab daily po 07/01/20   [name], [name], DO  dicyclomine (BENTYL) 20 MG tablet Take 20 mg by mouth 4 (four) times daily -  before meals and at bedtime. 01/04/19   [provider]  DULoxetine (CYMBALTA) 60 MG capsule Take 1 capsule (60 mg total) by mouth daily. 07/01/20   [name], [name], DO  fluticasone (FLONASE) 50 MCG/ACT nasal spray Place 2 sprays into both nostrils daily. 06/30/20   [name], [name], DO  levocetirizine (XYZAL) 5 MG tablet Take 0.5 tablets (2.5 mg total) by mouth every evening. 12/09/19   [name], [name], DO  levothyroxine (SYNTHROID) 25 MCG tablet Take 1 tablet (25 mcg total) by mouth daily before breakfast. 07/01/20 09/29/20  [name], [name], DO  loperamide (ANTI-DIARRHEAL) 2 MG tablet Take 1 tablet (2 mg total) by mouth daily as needed. 06/30/20   [name], [name], DO  LORazepam (ATIVAN) 0.5 MG tablet Take 1 tablet (0.5 mg total) by mouth 3 (three) times daily. 07/01/20   [name], [name], DO  metoprolol tartrate (LOPRESSOR) 25 MG tablet Take 1 tablet (25 mg total) by mouth 2 (two) times daily. 06/30/20 07/30/20  [name], [name], DO  mirtazapine (REMERON) 15 MG tablet Take 1 tablet (15 mg total) by mouth at bedtime. 07/11/20   [name], [name], DO  pantoprazole (PROTONIX) 20 MG tablet Take 1 tablet (20 mg total) by mouth daily. 06/30/20   [name], [name], DO  potassium chloride (KLOR-CON M10) 10 MEQ tablet Take 1 tablet (10 mEq total) by mouth daily. 07/01/20   [name], [name], DO  sitaGLIPtin (JANUVIA) 25 MG tablet Take 1 tablet (25 mg total) by mouth daily. 07/01/20   [name], DO    Physical Exam: Vitals:   07/13/20 1245 07/13/20 1330  07/13/20 1357 07/13/20 1415  BP: (!) 161/107 (!) 152/81 (!) 163/123 (!) 152/92  Pulse: 62 100 73 94  Resp: 19 18 10 17   Temp:      TempSrc:      SpO2: 97% 96% 98% 95%    General: 69 y.o. male resting in bed in NAD Eyes: PERRL, normal sclera ENMT: Nares patent w/o discharge, orophaynx clear, dentition normal, ears w/o discharge/lesions/ulcers Neck: Supple, trachea midline Cardiovascular: tachy irregular, +S1, S2, no m/g/r, equal pulses throughout Respiratory: CTABL, no w/r/r, normal WOB GI: BS+, NDNT, no masses noted, no organomegaly noted MSK: No e/c/c Skin: No rashes,  bruises, ulcerations noted Neuro: A&O x 1 (name only), no focal deficits Psyc: Agitated and animated, but largely cooperative  Labs on Admission: I have personally reviewed following labs and imaging studies  CBC: Recent Labs  Lab 07/13/20 1140  WBC 7.0  NEUTROABS 5.7  HGB 14.4  HCT 42.0  MCV 97.9  PLT 831   Basic Metabolic Panel: Recent Labs  Lab 07/13/20 1140 07/13/20 1351  NA 134*  --   K 3.7  --   CL 96*  --   CO2 28  --   GLUCOSE 142*  --   BUN 6*  --   CREATININE 1.10  --   CALCIUM 9.1  --   MG  --  2.0   GFR: Estimated Creatinine Clearance: 74.7 mL/min (by C-G formula based on SCr of 1.1 mg/dL). Liver Function Tests: Recent Labs  Lab 07/13/20 1140  AST 32  ALT 21  ALKPHOS 52  BILITOT 0.8  PROT 6.7  ALBUMIN 4.3   Recent Labs  Lab 07/13/20 1140  LIPASE 30   No results for input(s): AMMONIA in the last 168 hours. Coagulation Profile: No results for input(s): INR, PROTIME in the last 168 hours. Cardiac Enzymes: No results for input(s): CKTOTAL, CKMB, CKMBINDEX, TROPONINI in the last 168 hours. BNP (last 3 results) No results for input(s): PROBNP in the last 8760 hours. HbA1C: No results for input(s): HGBA1C in the last 72 hours. CBG: No results for input(s): GLUCAP in the last 168 hours. Lipid Profile: No results for input(s): CHOL, HDL, LDLCALC, TRIG, CHOLHDL, LDLDIRECT  in the last 72 hours. Thyroid Function Tests: No results for input(s): TSH, T4TOTAL, FREET4, T3FREE, THYROIDAB in the last 72 hours. Anemia Panel: No results for input(s): VITAMINB12, FOLATE, FERRITIN, TIBC, IRON, RETICCTPCT in the last 72 hours. Urine analysis:    Component Value Date/Time   COLORURINE YELLOW 07/13/2020 1341   APPEARANCEUR CLEAR 07/13/2020 1341   LABSPEC 1.008 07/13/2020 1341   PHURINE 6.0 07/13/2020 1341   GLUCOSEU NEGATIVE 07/13/2020 1341   HGBUR NEGATIVE 07/13/2020 1341   BILIRUBINUR NEGATIVE 07/13/2020 1341   BILIRUBINUR negative 01/03/2018 0959   KETONESUR NEGATIVE 07/13/2020 1341   PROTEINUR NEGATIVE 07/13/2020 1341   UROBILINOGEN 0.2 01/03/2018 0959   NITRITE NEGATIVE 07/13/2020 1341   LEUKOCYTESUR NEGATIVE 07/13/2020 1341    Radiological Exams on Admission: CT Head Wo Contrast  Result Date: 07/13/2020 CLINICAL DATA:  Mental status change, unknown cause. Additional provided: Recent medication change, worsening confusion, history of dementia. EXAM: CT HEAD WITHOUT CONTRAST TECHNIQUE: Contiguous axial images were obtained from the base of the skull through the vertex without intravenous contrast. COMPARISON:  Brain MRI 02/18/2019.  Head CT 02/17/2019. FINDINGS: Brain: Mild generalized cerebral atrophy. Mild ill-defined hypoattenuation within the cerebral white matter is nonspecific, but compatible with chronic small vessel ischemic disease. There is no acute intracranial hemorrhage. No demarcated cortical infarct. No extra-axial fluid collection. No evidence of intracranial mass. No midline shift. Vascular: No hyperdense vessel.  Atherosclerotic calcifications. Skull: Normal. Negative for fracture or focal lesion. Sinuses/Orbits: Visualized orbits show no acute finding. Minimal ethmoid sinus mucosal thickening. Other: No significant mastoid effusion at the imaged levels. IMPRESSION: No evidence of acute intracranial abnormality. Mild cerebral atrophy and chronic small  vessel ischemic disease, stable as compared to the brain MRI of 02/18/2019. Electronically Signed   By: Kellie Simmering DO   On: 07/13/2020 11:52   DG Chest Port 1 View  Result Date: 07/13/2020 CLINICAL DATA:  Sudden onset of confusion and  behavioral disturbance. EXAM: PORTABLE CHEST 1 VIEW COMPARISON:  02/17/2019 FINDINGS: Cardiac silhouette is enlarged. There is aortic atherosclerotic calcification. The lungs are clear. The vascularity is normal. No effusions. No acute bone finding. IMPRESSION: Cardiomegaly. No active disease. Electronically Signed   By: Nelson Chimes M.D.   On: 07/13/2020 11:37    EKG: Independently reviewed. A fib, no ST changes  Assessment/Plan Altered mental status Depression Dementia w/ behavioral disturbance     - admit to obs, med-surg     - family unable to control him at home, having difficulty with memory, found wandering streets with flash light     - recent loss of family member seems to have worsen his decline     - currently agitated and only oriented to self     - ED to IVC him; will need sitter/security     - have consulted psyc for assistance with medications (problem with prolonged Qtc limiting my options; maybe clonidine?)     - for now will continue ativan and avoid QT prolonging medications as able     - no focality on exam and until we can get agitation under control, will hold on MRI      - TOC for placement  Hypothyroidism     - continue home medicaitons  DM2     - SSI, DM2 diet, glucose checks, A1c  HTN     - continue amlodipine  A fib     - continue eliquis, metoprolol  Prolonged Qtc     - Mg2+/K+ ok; avoid QT prolonging medications  DVT prophylaxis: On eliquis  Code Status: FULL  Family Communication: With wife at bedside.  Consults called: Psyc consulted  Admission status: Observation  Status is: Observation  The patient remains OBS appropriate and will d/c before 2 midnights.  Dispo: The patient is from: Home               Anticipated d/c is to: Home              Anticipated d/c date is: 1 day              Patient currently is not medically stable to d/c.  Time spent coordinating admission: 60 minutes  [place] [facility]  If 7PM-7AM, please contact night-coverage [email or web address]  07/13/2020, 4:07 PM

## 2020-07-13 NOTE — Telephone Encounter (Signed)
Patient states Dr. [name] started patient on a medication for sleep. Since then, patient has been off. Patient has dementia and seems to be worse. Wife wants to know if this has anything to do with the new medications. Please call her to advise.
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
## 2020-07-13 NOTE — Telephone Encounter (Signed)
LLVM for pt wife to call back

## 2020-07-13 NOTE — ED Notes (Signed)
Pt is awake, ambulated to the bathroom. Pt is not wanting to go back into his room. Pt is standing at the nurses station.

## 2020-07-14 DIAGNOSIS — K219 Gastro-esophageal reflux disease without esophagitis: Secondary | ICD-10-CM | POA: Diagnosis present

## 2020-07-14 DIAGNOSIS — K5190 Ulcerative colitis, unspecified, without complications: Secondary | ICD-10-CM | POA: Diagnosis present

## 2020-07-14 DIAGNOSIS — I252 Old myocardial infarction: Secondary | ICD-10-CM | POA: Diagnosis not present

## 2020-07-14 DIAGNOSIS — E538 Deficiency of other specified B group vitamins: Secondary | ICD-10-CM | POA: Diagnosis present

## 2020-07-14 DIAGNOSIS — F411 Generalized anxiety disorder: Secondary | ICD-10-CM | POA: Diagnosis present

## 2020-07-14 DIAGNOSIS — I482 Chronic atrial fibrillation, unspecified: Secondary | ICD-10-CM | POA: Diagnosis present

## 2020-07-14 DIAGNOSIS — J302 Other seasonal allergic rhinitis: Secondary | ICD-10-CM | POA: Diagnosis present

## 2020-07-14 DIAGNOSIS — F319 Bipolar disorder, unspecified: Secondary | ICD-10-CM | POA: Diagnosis present

## 2020-07-14 DIAGNOSIS — R410 Disorientation, unspecified: Secondary | ICD-10-CM

## 2020-07-14 DIAGNOSIS — G4733 Obstructive sleep apnea (adult) (pediatric): Secondary | ICD-10-CM | POA: Diagnosis present

## 2020-07-14 DIAGNOSIS — R451 Restlessness and agitation: Secondary | ICD-10-CM

## 2020-07-14 DIAGNOSIS — F0391 Unspecified dementia with behavioral disturbance: Secondary | ICD-10-CM | POA: Diagnosis present

## 2020-07-14 DIAGNOSIS — I517 Cardiomegaly: Secondary | ICD-10-CM | POA: Diagnosis not present

## 2020-07-14 DIAGNOSIS — R9431 Abnormal electrocardiogram [ECG] [EKG]: Secondary | ICD-10-CM | POA: Diagnosis not present

## 2020-07-14 DIAGNOSIS — F05 Delirium due to known physiological condition: Secondary | ICD-10-CM | POA: Diagnosis present

## 2020-07-14 DIAGNOSIS — E119 Type 2 diabetes mellitus without complications: Secondary | ICD-10-CM | POA: Diagnosis present

## 2020-07-14 DIAGNOSIS — Z6841 Body Mass Index (BMI) 40.0 and over, adult: Secondary | ICD-10-CM | POA: Diagnosis not present

## 2020-07-14 DIAGNOSIS — R059 Cough, unspecified: Secondary | ICD-10-CM | POA: Diagnosis not present

## 2020-07-14 DIAGNOSIS — R443 Hallucinations, unspecified: Secondary | ICD-10-CM | POA: Diagnosis not present

## 2020-07-14 DIAGNOSIS — I4891 Unspecified atrial fibrillation: Secondary | ICD-10-CM | POA: Diagnosis not present

## 2020-07-14 DIAGNOSIS — E039 Hypothyroidism, unspecified: Secondary | ICD-10-CM | POA: Diagnosis present

## 2020-07-14 DIAGNOSIS — I10 Essential (primary) hypertension: Secondary | ICD-10-CM | POA: Diagnosis present

## 2020-07-14 DIAGNOSIS — E871 Hypo-osmolality and hyponatremia: Secondary | ICD-10-CM | POA: Diagnosis present

## 2020-07-14 DIAGNOSIS — F339 Major depressive disorder, recurrent, unspecified: Secondary | ICD-10-CM | POA: Diagnosis not present

## 2020-07-14 DIAGNOSIS — R339 Retention of urine, unspecified: Secondary | ICD-10-CM | POA: Diagnosis not present

## 2020-07-14 DIAGNOSIS — Z66 Do not resuscitate: Secondary | ICD-10-CM | POA: Diagnosis present

## 2020-07-14 DIAGNOSIS — G4700 Insomnia, unspecified: Secondary | ICD-10-CM | POA: Diagnosis present

## 2020-07-14 DIAGNOSIS — Z20822 Contact with and (suspected) exposure to covid-19: Secondary | ICD-10-CM | POA: Diagnosis present

## 2020-07-14 DIAGNOSIS — R4182 Altered mental status, unspecified: Secondary | ICD-10-CM | POA: Diagnosis not present

## 2020-07-14 DIAGNOSIS — E559 Vitamin D deficiency, unspecified: Secondary | ICD-10-CM | POA: Diagnosis present

## 2020-07-14 DIAGNOSIS — I700 Atherosclerosis of aorta: Secondary | ICD-10-CM | POA: Diagnosis present

## 2020-07-14 DIAGNOSIS — E876 Hypokalemia: Secondary | ICD-10-CM | POA: Diagnosis not present

## 2020-07-14 HISTORY — DX: Disorientation, unspecified: R41.0

## 2020-07-14 LAB — COMPREHENSIVE METABOLIC PANEL
ALT: 22 U/L (ref 0–44)
AST: 37 U/L (ref 15–41)
Albumin: 4 g/dL (ref 3.5–5.0)
Alkaline Phosphatase: 50 U/L (ref 38–126)
Anion gap: 11 (ref 5–15)
BUN: 6 mg/dL — ABNORMAL LOW (ref 8–23)
CO2: 25 mmol/L (ref 22–32)
Calcium: 9 mg/dL (ref 8.9–10.3)
Chloride: 97 mmol/L — ABNORMAL LOW (ref 98–111)
Creatinine, Ser: 0.96 mg/dL (ref 0.61–1.24)
GFR, Estimated: >60 mL/min (ref >60)
Glucose, Bld: 148 mg/dL — ABNORMAL HIGH (ref 70–99)
Potassium: 3.5 mmol/L (ref 3.5–5.1)
Sodium: 133 mmol/L — ABNORMAL LOW (ref 135–145)
Total Bilirubin: 1.3 mg/dL — ABNORMAL HIGH (ref 0.3–1.2)
Total Protein: 6.6 g/dL (ref 6.5–8.1)

## 2020-07-14 LAB — GLUCOSE, CAPILLARY
Glucose-Capillary: 113 mg/dL — ABNORMAL HIGH (ref 70–99)
Glucose-Capillary: 133 mg/dL — ABNORMAL HIGH (ref 70–99)
Glucose-Capillary: 156 mg/dL — ABNORMAL HIGH (ref 70–99)
Glucose-Capillary: 110 mg/dL — ABNORMAL HIGH (ref 70–99)

## 2020-07-14 LAB — CBC
HCT: 44 % (ref 39.0–52.0)
Hemoglobin: 14.8 g/dL (ref 13.0–17.0)
MCH: 33.3 pg (ref 26.0–34.0)
MCHC: 33.6 g/dL (ref 30.0–36.0)
MCV: 99.1 fL (ref 80.0–100.0)
Platelets: 205 10*3/uL (ref 150–400)
RBC: 4.44 MIL/uL (ref 4.22–5.81)
RDW: 15.1 % (ref 11.5–15.5)
WBC: 9.4 10*3/uL (ref 4.0–10.5)
nRBC: 0 % (ref 0.0–0.2)

## 2020-07-14 LAB — RPR: RPR Ser Ql: NONREACTIVE

## 2020-07-14 LAB — PHOSPHORUS: Phosphorus: 2.5 mg/dL (ref 2.5–4.6)

## 2020-07-14 MED ORDER — HALOPERIDOL LACTATE 5 MG/ML IJ SOLN
5.0000 mg | Freq: Four times a day (QID) | INTRAMUSCULAR | Status: AC | PRN
  Administered 2020-07-14: 5 mg via INTRAMUSCULAR

## 2020-07-14 MED ORDER — HALOPERIDOL LACTATE 5 MG/ML IJ SOLN
5.0000 mg | Freq: Four times a day (QID) | INTRAMUSCULAR | Status: DC | PRN
  Filled 2020-07-14: qty 1

## 2020-07-14 MED ORDER — CYANOCOBALAMIN 1000 MCG/ML IJ SOLN
1000.0000 ug | Freq: Every day | INTRAMUSCULAR | Status: AC
  Administered 2020-07-14 – 2020-07-20 (×6): 1000 ug via SUBCUTANEOUS
  Filled 2020-07-14 (×7): qty 1

## 2020-07-14 MED ORDER — VITAMIN D (ERGOCALCIFEROL) 1.25 MG (50000 UNIT) PO CAPS
50000.0000 [IU] | ORAL_CAPSULE | ORAL | Status: DC
  Administered 2020-07-29 – 2020-09-09 (×7): 50000 [IU] via ORAL
  Filled 2020-07-14 (×10): qty 1

## 2020-07-14 MED ORDER — THIAMINE HCL 100 MG/ML IJ SOLN: 100.0000 mg | Freq: Once | INTRAMUSCULAR | Status: DC

## 2020-07-14 MED ORDER — LORAZEPAM 2 MG/ML IJ SOLN
1.0000 mg | Freq: Four times a day (QID) | INTRAMUSCULAR | Status: DC | PRN
  Administered 2020-07-14 – 2020-07-15 (×4): 1 mg via INTRAMUSCULAR
  Filled 2020-07-14 (×4): qty 1

## 2020-07-14 MED ORDER — LABETALOL HCL 5 MG/ML IV SOLN
10.0000 mg | Freq: Once | INTRAVENOUS | Status: DC
  Filled 2020-07-14: qty 4

## 2020-07-14 MED ORDER — VALACYCLOVIR HCL 500 MG PO TABS
1000.0000 mg | ORAL_TABLET | Freq: Three times a day (TID) | ORAL | Status: DC
  Administered 2020-07-14 – 2020-07-16 (×8): 1000 mg via ORAL
  Filled 2020-07-14 (×12): qty 2

## 2020-07-14 MED ORDER — LORAZEPAM 2 MG/ML IJ SOLN
INTRAMUSCULAR | Status: AC
  Filled 2020-07-14: qty 1

## 2020-07-14 MED ORDER — CARBAMAZEPINE 200 MG PO TABS
100.0000 mg | ORAL_TABLET | Freq: Two times a day (BID) | ORAL | Status: DC
  Administered 2020-07-14 (×2): 100 mg via ORAL
  Filled 2020-07-14 (×3): qty 0.5

## 2020-07-14 MED ORDER — THIAMINE HCL 100 MG PO TABS
100.0000 mg | ORAL_TABLET | Freq: Once | ORAL | Status: AC
  Administered 2020-07-14: 100 mg via ORAL
  Filled 2020-07-14: qty 1

## 2020-07-14 MED ORDER — LORAZEPAM 2 MG/ML IJ SOLN
2.0000 mg | Freq: Once | INTRAMUSCULAR | Status: AC
  Administered 2020-07-14: 2 mg via INTRAMUSCULAR

## 2020-07-14 NOTE — Progress Notes (Signed)
Pt arrived from the ED with a safety sitter & IVC folder. Pt is repeatedly asking about leaving and asking if his wife could come get him.  I found & removed  items in his pockets that were from the drawers in the ED room he had been in. Pt allowed to stay in his PJ pants & shirt he presented in. There was  No IV found in his R AC that was previously placed there according to his chart.

## 2020-07-14 NOTE — Progress Notes (Signed)
Pt had awakened, he was getting more confused and attempting to leave the room , when placing the cardiac monitor this caused more agitation. Distress button used by staff in order to get security here for de escalation. After a few minutes pt chose to lay back down in bed. Awaiting response from [facility] re- medication

## 2020-07-14 NOTE — Progress Notes (Signed)
Message sent out to oncall provider regarding sitter order for IVC and isolation at around 2015 awaiting order.

## 2020-07-14 NOTE — Progress Notes (Signed)
This note also relates to the following rows which could not be included: ECG Heart Rate - Cannot attach notes to unvalidated device data    07/14/20 0500  Assess: MEWS Score  Temp 98.9 F (37.2 C)  BP (!) 144/93  Pulse Rate (!) 127  Resp 20  Level of Consciousness Alert  SpO2 99 %  O2 Device Room Air  Assess: MEWS Score  MEWS Temp 0  MEWS Systolic 0  MEWS Pulse 2  MEWS RR 0  MEWS LOC 0  MEWS Score 2  MEWS Score Color Yellow  Assess: if the MEWS score is Yellow or Red  Were vital signs taken at a resting state? Yes  Focused Assessment No change from prior assessment  Early Detection of Sepsis Score *See Row Information* Low  MEWS guidelines implemented *See Row Information* Yes  Treat  MEWS Interventions Other (Comment) (notified Charge Nurse Debbie RN)  Pain Scale Faces  Pain Score 0  Take Vital Signs  Increase Vital Sign Frequency  Yellow: Q 2hr X 2 then Q 4hr X 2, if remains yellow, continue Q 4hrs  Escalate  MEWS: Escalate Yellow: discuss with charge nurse/RN and consider discussing with provider and RRT  Notify: Charge Nurse/RN  Name of Charge Nurse/RN Notified Debbie RN  Date Charge Nurse/RN Notified 07/14/20  Time Charge Nurse/RN Notified 3005  Notify: Provider  Provider Name/Title M. Sharlet Salina  Date Provider Notified 07/14/20  Time Provider Notified [phone]  Notification Type Page  Notification Reason Other (Comment) (yellow mews)  Response See new orders  Date of Provider Response 07/14/20  Time of Provider Response [phone]  Document  Progress note created (see row info) Yes   Yellow MEWS noted at 0628. Charge nurse Control and instrumentation engineer notified. Patient is resting in 4 point restraints. Easy to arouse when entering the room. No signs and symptoms of distress noted. M[facility] on-call attending notified via [place], awaiting new orders.
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
## 2020-07-14 NOTE — Plan of Care (Signed)
Pt admitted with AMS and dementia like behaviors. Combative & IVCed

## 2020-07-14 NOTE — Progress Notes (Signed)
Pt is in 4 pt restraints, still trying to get out of those, spitting. Pts HR is 140's - 160s. [facility] Ardith Dark NP aware and has ordered Haldol as HR is elevated because of agitation. Sitted at bedside
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
## 2020-07-14 NOTE — Consult Note (Signed)
[name] Flesher Sr. is a 69 year old male with medical history significant for A. fib, dementia, and depression.  Patient presents from home with increased agitation over the past week.  As per wife patient has been having worsening dementia over the last 6 months, which treatment failure to include Cymbalta, Prozac, and Abilify.  She states she is concerned about everyone safety, and so she brought him into the hospital. Psych consult place for altered mental status, worsening agitation, limited options with prolonged QTC issues.  On examination patient appeared to be resting, was also noted to be in four-point restraints with safety sitter at bedside.  This nurse practitioner spoke to his safety sitter, who reports some improvement in his behavior on this day.  She states she has decreased stimulation, and minimize disturbances, and he has responded appropriately.  EKG obtained yesterday, QTC of 519.  Patient is currently resumed on his home medications duloxetine and mirtazapine. It appears patient symptoms based off history provided by his wife that this is a progression of his dementia. patient was admitted with worsening cognitive impairment, wandering behaviors, and decrease in mental status.  It is felt that patient may need higher level of care due to worsening memory impairment, confusion, wandering behaviors, and restlessness.  Patient behaviors are consistent with a person with dementia, and other memory loss. SNF,  Memory care unit, Home Health unit would be appropriate as they are well equipped to help elderly persons with memory loss, maintain cognitive skills and help with quality of life.   - Will repeat EKG today to determine if any improvement in QTC.  - At this time we must refrain from all antipsychotic medications, until QTC is within normal range.  - Will initiate low-dose mood stabilizer Tegretol to help with aggression, behavioral disturbances, and agitation.    - Will recommend referral  to memory care unit on long-term care facility. -Psychiatry to sign off at this time.

## 2020-07-15 DIAGNOSIS — R410 Disorientation, unspecified: Secondary | ICD-10-CM | POA: Diagnosis not present

## 2020-07-15 DIAGNOSIS — R451 Restlessness and agitation: Secondary | ICD-10-CM | POA: Diagnosis not present

## 2020-07-15 DIAGNOSIS — R9431 Abnormal electrocardiogram [ECG] [EKG]: Secondary | ICD-10-CM | POA: Diagnosis not present

## 2020-07-15 DIAGNOSIS — R4182 Altered mental status, unspecified: Secondary | ICD-10-CM | POA: Diagnosis not present

## 2020-07-15 LAB — COMPREHENSIVE METABOLIC PANEL
ALT: 26 U/L (ref 0–44)
AST: 47 U/L — ABNORMAL HIGH (ref 15–41)
Albumin: 4.1 g/dL (ref 3.5–5.0)
Alkaline Phosphatase: 49 U/L (ref 38–126)
Anion gap: 14 (ref 5–15)
BUN: 7 mg/dL — ABNORMAL LOW (ref 8–23)
CO2: 24 mmol/L (ref 22–32)
Calcium: 9.1 mg/dL (ref 8.9–10.3)
Chloride: 95 mmol/L — ABNORMAL LOW (ref 98–111)
Creatinine, Ser: 0.96 mg/dL (ref 0.61–1.24)
GFR, Estimated: >60 mL/min (ref >60)
Glucose, Bld: 140 mg/dL — ABNORMAL HIGH (ref 70–99)
Potassium: 3.5 mmol/L (ref 3.5–5.1)
Sodium: 133 mmol/L — ABNORMAL LOW (ref 135–145)
Total Bilirubin: 1.4 mg/dL — ABNORMAL HIGH (ref 0.3–1.2)
Total Protein: 7 g/dL (ref 6.5–8.1)

## 2020-07-15 LAB — GLUCOSE, CAPILLARY
Glucose-Capillary: 128 mg/dL — ABNORMAL HIGH (ref 70–99)
Glucose-Capillary: 124 mg/dL — ABNORMAL HIGH (ref 70–99)
Glucose-Capillary: 108 mg/dL — ABNORMAL HIGH (ref 70–99)
Glucose-Capillary: 124 mg/dL — ABNORMAL HIGH (ref 70–99)

## 2020-07-15 LAB — FOLATE: Folate: 9.7 ng/mL (ref >5.9)

## 2020-07-15 MED ORDER — HALOPERIDOL LACTATE 5 MG/ML IJ SOLN
2.0000 mg | Freq: Four times a day (QID) | INTRAMUSCULAR | Status: DC | PRN
  Administered 2020-07-15 – 2020-07-19 (×6): 5 mg via INTRAMUSCULAR
  Filled 2020-07-15 (×7): qty 1

## 2020-07-15 MED ORDER — LORAZEPAM 0.5 MG PO TABS
0.5000 mg | ORAL_TABLET | Freq: Three times a day (TID) | ORAL | Status: DC
  Administered 2020-07-15 – 2020-07-17 (×6): 0.5 mg via ORAL
  Filled 2020-07-15 (×6): qty 1

## 2020-07-15 MED ORDER — DIVALPROEX SODIUM 125 MG PO CSDR
250.0000 mg | DELAYED_RELEASE_CAPSULE | Freq: Two times a day (BID) | ORAL | Status: DC
  Administered 2020-07-15 – 2020-07-17 (×4): 250 mg via ORAL
  Filled 2020-07-15 (×6): qty 2

## 2020-07-15 MED ORDER — HALOPERIDOL LACTATE 5 MG/ML IJ SOLN: 2.0000 mg | Freq: Four times a day (QID) | INTRAMUSCULAR | Status: DC | PRN

## 2020-07-15 MED ORDER — METOPROLOL TARTRATE 50 MG PO TABS
50.0000 mg | ORAL_TABLET | Freq: Two times a day (BID) | ORAL | Status: DC
  Administered 2020-07-15 – 2020-09-03 (×95): 50 mg via ORAL
  Filled 2020-07-15 (×101): qty 1

## 2020-07-15 NOTE — Progress Notes (Signed)
Scheduled EKG done this morning. Patient Afib w/ RVR. Pt refused to have tele box on for majority of the shift. Oncall provider secure texted. Will endorse to oncoming shift.
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
## 2020-07-16 DIAGNOSIS — R451 Restlessness and agitation: Secondary | ICD-10-CM | POA: Diagnosis not present

## 2020-07-16 DIAGNOSIS — R9431 Abnormal electrocardiogram [ECG] [EKG]: Secondary | ICD-10-CM | POA: Diagnosis not present

## 2020-07-16 DIAGNOSIS — R410 Disorientation, unspecified: Secondary | ICD-10-CM | POA: Diagnosis not present

## 2020-07-16 DIAGNOSIS — R4182 Altered mental status, unspecified: Secondary | ICD-10-CM | POA: Diagnosis not present

## 2020-07-16 LAB — GLUCOSE, CAPILLARY
Glucose-Capillary: 130 mg/dL — ABNORMAL HIGH (ref 70–99)
Glucose-Capillary: 132 mg/dL — ABNORMAL HIGH (ref 70–99)
Glucose-Capillary: 141 mg/dL — ABNORMAL HIGH (ref 70–99)
Glucose-Capillary: 114 mg/dL — ABNORMAL HIGH (ref 70–99)

## 2020-07-16 MED ORDER — SALINE SPRAY 0.65 % NA SOLN
1.0000 | NASAL | Status: DC | PRN
  Administered 2020-07-16 – 2020-09-02 (×4): 1 via NASAL
  Filled 2020-07-16: qty 44

## 2020-07-16 MED ORDER — AZATHIOPRINE 50 MG PO TABS
200.0000 mg | ORAL_TABLET | Freq: Every day | ORAL | Status: DC
  Administered 2020-07-16 – 2020-09-09 (×56): 200 mg via ORAL
  Filled 2020-07-16 (×57): qty 4

## 2020-07-16 MED ORDER — HALOPERIDOL 0.5 MG PO TABS
0.5000 mg | ORAL_TABLET | Freq: Two times a day (BID) | ORAL | Status: DC
  Administered 2020-07-16 – 2020-07-17 (×2): 0.5 mg via ORAL
  Filled 2020-07-16 (×2): qty 1

## 2020-07-16 NOTE — Progress Notes (Signed)
[name] Schoenberg [place] Sr.  HWE:[id number] DOB: 1951/08/05 DOA: 07/13/2020 PCP: [name], DO    Brief Narrative:  69yo with a history of atrial fibrillation, depression, and dementia who was brought to the ED with increasing agitation over a week as reported by his wife.  His dementia was noted to have progressed significantly over the last 6 months.  Multiple medications have been attempted in the outpatient setting to no avail.  He has been found wandering the neighborhood at night with a flashlight, and 1 day [place] telling anyone where he was.  In the ED CT head was negative for acute findings and there was no evidence of an acute infection.  Significant Events:  11/3 admit via ED  Antimicrobials:  None  DVT prophylaxis: Eliquis  Subjective: Some mild intermittent tachycardia.  Blood pressure modestly elevated.  Vital signs otherwise stable. Continues to cycle between sedation or severe agitation w/ combativeness.  I spoke w/ his wife at the bedside at length.   Assessment & Plan:  Acute delirium -progressive chronic dementia Psychiatry has evaluated and made recommendations regarding medical therapy -RPR nonreactive, ethanol undetectable at admission, ammonia essentially normal, TSH normal, thiamine normal - perhaps B12 deficiency is contributing - continue to replace B12 and vitamin D -patient is due for an MRI of the brain which was scheduled as an outpatient -we will attempt to accomplish this during his hospital stay but agitation will make this difficult and he is not yet calm enough for this today -with improvement in QTC Haldol has been dosed and appears to do a good job of keeping the patient calm - give trial of scheduled oral haldol - if agitation/delirium can't be corrected we will need to find a med regimen that allows him to safely be at home   B12 deficiency B12 quite low at 179 on outpt check 06/30/20 - continue to replace with  high-dose B12 SQ - no apparent clinical impact thus far   Shingles Apparent across L chest/flank - maturing lesions at present - cont contact isolation - avoid zovirax due to fear for worsening confusion   Vitamin D deficiency 25 hydroxy vitamin D was low at 27 as assessed on 06/30/2020 - replacing   Depression Continue usual home medications -acute situational worsening recently due to death of a very close family member which could be precipitating some of his symptoms  Hypothyroidism Continue usual home medications - TSH and FT4 within normal range   DM 2 CBG well controlled - A1c 6.0 -no hypoglycemia  HTN Blood pressure reasonably controlled  Chronic atrial fibrillation Continue usual metoprolol and chronic Eliquis - persistently elevated heart rate likely related to agitation  Prolonged QTC Electrolytes to be maximized -avoid QT prolonging medications -QTC improved to 444 11/5 - recheck w/ EKG in AM - unable to keep on tele due to agitation    Code Status: FULL CODE Family Communication:  Status is: Inpatient  Remains inpatient appropriate because:Inpatient level of care appropriate due to severity of illness   Dispo: The patient is from: Home              Anticipated d/c is to: undetermined              Anticipated d/c date is: 2 days              Patient currently is not medically stable to d/c.  Consultants:  Psychiatry  Objective: Blood pressure (!) 151/91,  pulse 75, temperature 98.2 F (36.8 C), temperature source Oral, resp. rate 20, SpO2 93 %.  Intake/Output Summary (Last 24 hours) at 07/16/2020 1044 Last data filed at 07/16/2020 0858 Gross per 24 hour  Intake 500 ml  Output 1800 ml  Net -1300 ml   There were no vitals filed for this visit.  Examination: General: No acute respiratory distress - sedate at time of my visit  Lungs: Clear to auscultation bilaterally   Cardiovascular: reg rate - irreg - no M or rub  Abdomen: NT/ND, soft, bs+, no mass    Extremities: No signif edema B lower extremities    CBC: Recent Labs  Lab 07/13/20 1140 07/14/20 0403  WBC 7.0 9.4  NEUTROABS 5.7  --   HGB 14.4 14.8  HCT 42.0 44.0  MCV 97.9 99.1  PLT 206 712   Basic Metabolic Panel: Recent Labs  Lab 07/13/20 1140 07/13/20 1351 07/14/20 0358 07/14/20 0403 07/15/20 0402  NA 134*  --   --  133* 133*  K 3.7  --   --  3.5 3.5  CL 96*  --   --  97* 95*  CO2 28  --   --  25 24  GLUCOSE 142*  --   --  148* 140*  BUN 6*  --   --  6* 7*  CREATININE 1.10  --   --  0.96 0.96  CALCIUM 9.1  --   --  9.0 9.1  MG  --  2.0  --   --   --   PHOS  --   --  2.5  --   --    GFR: Estimated Creatinine Clearance: 85.6 mL/min (by C-G formula based on SCr of 0.96 mg/dL).  Liver Function Tests: Recent Labs  Lab 07/13/20 1140 07/14/20 0403 07/15/20 0402  AST 32 37 47*  ALT 21 22 26   ALKPHOS 52 50 49  BILITOT 0.8 1.3* 1.4*  PROT 6.7 6.6 7.0  ALBUMIN 4.3 4.0 4.1   Recent Labs  Lab 07/13/20 1140  LIPASE 30    HbA1C: Hemoglobin A1C  Date/Time Value Ref Range Status  08/09/2017 12:00 AM 6.0  Final   Hgb A1c MFr Bld  Date/Time Value Ref Range Status  06/30/2020 11:07 AM 6.9 (H) 4.6 - 6.5 % Final    Comment:    Glycemic Control Guidelines for People with Diabetes:Non Diabetic:  <6%Goal of Therapy: <7%Additional Action Suggested:  >8%   02/17/2019 07:07 PM 5.8 (H) 4.8 - 5.6 % Final    Comment:    (NOTE) Pre diabetes:          5.7%-6.4% Diabetes:              >6.4% Glycemic control for   <7.0% adults with diabetes     CBG: Recent Labs  Lab 07/15/20 0821 07/15/20 1154 07/15/20 1652 07/15/20 2202 07/16/20 0850  GLUCAP 128* 124* 108* 124* 130*    Recent Results (from the past 240 hour(s))  Respiratory Panel by RT PCR (Flu A&B, Covid) - Nasopharyngeal Swab     Status: None   Collection Time: 07/13/20  7:15 PM   Specimen: Nasopharyngeal Swab  Result Value Ref Range Status   SARS Coronavirus 2 by RT PCR NEGATIVE NEGATIVE Final     Comment: (NOTE) SARS-CoV-2 target nucleic acids are NOT DETECTED.  The SARS-CoV-2 RNA is generally detectable in upper respiratoy specimens during the acute phase of infection. The lowest concentration of SARS-CoV-2 viral copies this assay can detect  is 131 copies/mL. A negative result does not preclude SARS-Cov-2 infection and should not be used as the sole basis for treatment or other patient management decisions. A negative result may occur with  improper specimen collection/handling, submission of specimen other than nasopharyngeal swab, presence of viral mutation(s) within the areas targeted by this assay, and inadequate number of viral copies (<131 copies/mL). A negative result must be combined with clinical observations, patient history, and epidemiological information. The expected result is Negative.  Fact Sheet for Patients:  PinkCheek.be  Fact Sheet for Healthcare Providers:  GravelBags.it  This test is no t yet approved or cleared by the [place] FDA and  has been authorized for detection and/or diagnosis of SARS-CoV-2 by FDA under an Emergency Use Authorization (EUA). This EUA will remain  in effect (meaning this test can be used) for the duration of the COVID-19 declaration under Section 564(b)(1) of the Act, 21 U.S.C. section 360bbb-3(b)(1), unless the authorization is terminated or revoked sooner.     Influenza A by PCR NEGATIVE NEGATIVE Final   Influenza B by PCR NEGATIVE NEGATIVE Final    Comment: (NOTE) The Xpert Xpress SARS-CoV-2/FLU/RSV assay is intended as an aid in  the diagnosis of influenza from Nasopharyngeal swab specimens and  should not be used as a sole basis for treatment. Nasal washings and  aspirates are unacceptable for Xpert Xpress SARS-CoV-2/FLU/RSV  testing.  Fact Sheet for Patients: PinkCheek.be  Fact Sheet for Healthcare  Providers: GravelBags.it  This test is not yet approved or cleared by the [place] FDA and  has been authorized for detection and/or diagnosis of SARS-CoV-2 by  FDA under an Emergency Use Authorization (EUA). This EUA will remain  in effect (meaning this test can be used) for the duration of the  Covid-19 declaration under Section 564(b)(1) of the Act, 21  U.S.C. section 360bbb-3(b)(1), unless the authorization is  terminated or revoked. Performed at [facility], [place] [address], [place], [place] [address]      Scheduled Meds: . amLODipine  5 mg Oral Daily  . apixaban  5 mg Oral BID  . atorvastatin  20 mg Oral Daily  . cyanocobalamin  1,000 mcg Subcutaneous Daily  . divalproex  250 mg Oral Q12H  . DULoxetine  60 mg Oral Daily  . insulin aspart  0-5 Units Subcutaneous QHS  . insulin aspart  0-9 Units Subcutaneous TID WC  . levothyroxine  25 mcg Oral Q0600  . LORazepam  0.5 mg Oral TID  . metoprolol tartrate  50 mg Oral BID  . mirtazapine  15 mg Oral QHS  . valACYclovir  1,000 mg Oral TID  . Vitamin D (Ergocalciferol)  50,000 Units Oral Q7 days     LOS: 2 days   Cherene Altes, MD [facility] Office  [phone] Pager - Text Page per Amion  If 7PM-7AM, please contact night-coverage per Amion 07/16/2020, 10:44 AM

## 2020-07-17 DIAGNOSIS — R410 Disorientation, unspecified: Secondary | ICD-10-CM | POA: Diagnosis not present

## 2020-07-17 DIAGNOSIS — R9431 Abnormal electrocardiogram [ECG] [EKG]: Secondary | ICD-10-CM | POA: Diagnosis not present

## 2020-07-17 DIAGNOSIS — R451 Restlessness and agitation: Secondary | ICD-10-CM | POA: Diagnosis not present

## 2020-07-17 DIAGNOSIS — R4182 Altered mental status, unspecified: Secondary | ICD-10-CM | POA: Diagnosis not present

## 2020-07-17 LAB — COMPREHENSIVE METABOLIC PANEL
ALT: 47 U/L — ABNORMAL HIGH (ref 0–44)
AST: 71 U/L — ABNORMAL HIGH (ref 15–41)
Albumin: 3.8 g/dL (ref 3.5–5.0)
Alkaline Phosphatase: 43 U/L (ref 38–126)
Anion gap: 12 (ref 5–15)
BUN: 10 mg/dL (ref 8–23)
CO2: 26 mmol/L (ref 22–32)
Calcium: 9 mg/dL (ref 8.9–10.3)
Chloride: 92 mmol/L — ABNORMAL LOW (ref 98–111)
Creatinine, Ser: 0.96 mg/dL (ref 0.61–1.24)
GFR, Estimated: >60 mL/min (ref >60)
Glucose, Bld: 126 mg/dL — ABNORMAL HIGH (ref 70–99)
Potassium: 3.6 mmol/L (ref 3.5–5.1)
Sodium: 130 mmol/L — ABNORMAL LOW (ref 135–145)
Total Bilirubin: 1.6 mg/dL — ABNORMAL HIGH (ref 0.3–1.2)
Total Protein: 6.8 g/dL (ref 6.5–8.1)

## 2020-07-17 LAB — GLUCOSE, CAPILLARY
Glucose-Capillary: 120 mg/dL — ABNORMAL HIGH (ref 70–99)
Glucose-Capillary: 126 mg/dL — ABNORMAL HIGH (ref 70–99)
Glucose-Capillary: 114 mg/dL — ABNORMAL HIGH (ref 70–99)
Glucose-Capillary: 111 mg/dL — ABNORMAL HIGH (ref 70–99)

## 2020-07-17 LAB — MAGNESIUM: Magnesium: 2 mg/dL (ref 1.7–2.4)

## 2020-07-17 MED ORDER — LORAZEPAM 1 MG PO TABS
1.0000 mg | ORAL_TABLET | ORAL | Status: DC | PRN
  Administered 2020-07-18: 1 mg via ORAL
  Filled 2020-07-17: qty 2
  Filled 2020-07-17: qty 1

## 2020-07-17 MED ORDER — LORAZEPAM 2 MG/ML IJ SOLN
2.0000 mg | Freq: Four times a day (QID) | INTRAMUSCULAR | Status: DC | PRN
  Administered 2020-07-17: 2 mg via INTRAMUSCULAR
  Filled 2020-07-17: qty 1

## 2020-07-17 MED ORDER — LORAZEPAM 2 MG/ML IJ SOLN
1.0000 mg | INTRAMUSCULAR | Status: DC | PRN
  Administered 2020-07-17: 2 mg via INTRAMUSCULAR
  Administered 2020-07-17: 4 mg via INTRAMUSCULAR
  Administered 2020-07-18: 1 mg via INTRAMUSCULAR
  Administered 2020-07-19: 3 mg via INTRAMUSCULAR
  Administered 2020-07-19: 2 mg via INTRAMUSCULAR
  Filled 2020-07-17: qty 1
  Filled 2020-07-17: qty 2
  Filled 2020-07-17: qty 1
  Filled 2020-07-17: qty 2
  Filled 2020-07-17 (×2): qty 1

## 2020-07-17 MED ORDER — LORAZEPAM 2 MG/ML IJ SOLN: 1.0000 mg | INTRAMUSCULAR | Status: DC | PRN

## 2020-07-17 MED ORDER — HALOPERIDOL 1 MG PO TABS
1.0000 mg | ORAL_TABLET | Freq: Two times a day (BID) | ORAL | Status: DC
  Administered 2020-07-17 – 2020-07-18 (×2): 1 mg via ORAL
  Filled 2020-07-17 (×2): qty 1

## 2020-07-17 MED ORDER — LORAZEPAM 1 MG PO TABS: 1.0000 mg | ORAL_TABLET | ORAL | Status: DC | PRN

## 2020-07-17 NOTE — Progress Notes (Signed)
[name] Sar [place] Sr.  PNT:[id number] DOB: [birth year] DOA: 07/13/2020 PCP: [name], DO    Brief Narrative:  69yo with a history of atrial fibrillation, depression, and dementia who was brought to the ED with increasing agitation over a week as reported by his wife.  His dementia was noted to have progressed significantly over the last 6 months.  Multiple medications have been attempted in the outpatient setting to no avail.  He has been found wandering the neighborhood at night with a flashlight, and 1 day [place] without telling anyone where he was.  In the ED CT head was negative for acute findings and there was no evidence of an acute infection.  Significant Events:  11/3 admit via ED  Antimicrobials:  None  DVT prophylaxis: Eliquis  Subjective: Afebrile.  Vital signs stable.  Saturation 95% on room air. QTc ~445 on EKG this AM.   The patient has become increasingly more agitated today, despite medications that have been doing her reasonable job of controlling him the day before.  Given the timing I question a possible component of alcohol withdrawal.  He does not appear to be in extremis.  There is no respiratory distress.  He reports pain related to his shingles but no substernal chest pressure.  Assessment & Plan:  Acute delirium -progressive chronic dementia Psychiatry has evaluated and made recommendations regarding medical therapy -RPR nonreactive, ethanol undetectable at admission, ammonia essentially normal, TSH normal, thiamine normal - perhaps B12 deficiency is contributing - continue to replace B12 and vitamin D -patient is due for an MRI of the brain which was scheduled as an outpatient -we will attempt to accomplish this during his hospital stay but agitation will make this difficult and he is not yet calm enough for this today -with improvement in QTC Haldol has been dosed and appears to do a good job of keeping the patient calm - give trial of  scheduled oral haldol - if agitation/delirium can't be corrected we will need to find a med regimen that allows him to safely be at home   ?EtOH withdrawal No known hx of alcoholism, but w/ recent depression and life stressors is conceivable pt could have been abusing EtOH surreptitiously - give trial of CIWA protocol and follow   B12 deficiency B12 quite low at 179 on outpt check 06/30/20 - continue to replace with high-dose B12 SQ - no apparent clinical impact thus far   Shingles Apparent across L chest/flank - maturing lesions at present - cont contact isolation - stop zovirax due to fear for worsening confusion   Vitamin D deficiency 25 hydroxy vitamin D was low at 27 as assessed on 06/30/2020 - replacing   Depression Continue usual home medications -acute situational worsening recently due to death of a very close family member which could be precipitating some of his symptoms  Hypothyroidism Continue usual home medications - TSH and FT4 within normal range   DM 2 CBG well controlled - A1c 6.0 -no hypoglycemia  HTN Blood pressure reasonably controlled  Chronic atrial fibrillation Continue usual metoprolol and chronic Eliquis - persistently elevated heart rate likely related to agitation  Prolonged QTC Electrolytes to be maximized -avoid QT prolonging medications -QTC improved to 444 11/5 - recheck w/ EKG in AM - unable to keep on tele due to agitation    Code Status: FULL CODE Family Communication: Spoke with wife at bedside Status is: Inpatient  Remains inpatient appropriate because:Inpatient level of  care appropriate due to severity of illness   Dispo: The patient is from: Home              Anticipated d/c is to: undetermined              Anticipated d/c date is: 2 days              Patient currently is not medically stable to d/c.  Consultants:  Psychiatry  Objective: Blood pressure 139/86, pulse 76, temperature 98 F (36.7 C), temperature source Oral, resp.  rate 16, SpO2 95 %.  Intake/Output Summary (Last 24 hours) at 07/17/2020 1001 Last data filed at 07/17/2020 0206 Gross per 24 hour  Intake 300 ml  Output 1000 ml  Net -700 ml   There were no vitals filed for this visit.  Examination: General: No acute respiratory distress -alert and conversant but confused and at times agitated during my visit Lungs: Clear to auscultation bilaterally without wheezing Cardiovascular: reg rate - irreg - no M or rub  Abdomen: NT/ND, soft, bs+, no mass  Extremities: No signif edema B LE  CBC: Recent Labs  Lab 07/13/20 1140 07/14/20 0403  WBC 7.0 9.4  NEUTROABS 5.7  --   HGB 14.4 14.8  HCT 42.0 44.0  MCV 97.9 99.1  PLT 206 254   Basic Metabolic Panel: Recent Labs  Lab 07/13/20 1140 07/13/20 1351 07/14/20 0358 07/14/20 0403 07/15/20 0402 07/17/20 0406  NA   < >  --   --  133* 133* 130*  K   < >  --   --  3.5 3.5 3.6  CL   < >  --   --  97* 95* 92*  CO2   < >  --   --  25 24 26   GLUCOSE   < >  --   --  148* 140* 126*  BUN   < >  --   --  6* 7* 10  CREATININE   < >  --   --  0.96 0.96 0.96  CALCIUM   < >  --   --  9.0 9.1 9.0  MG  --  2.0  --   --   --  2.0  PHOS  --   --  2.5  --   --   --    < > = values in this interval not displayed.   GFR: Estimated Creatinine Clearance: 85.6 mL/min (by C-G formula based on SCr of 0.96 mg/dL).  Liver Function Tests: Recent Labs  Lab 07/13/20 1140 07/14/20 0403 07/15/20 0402 07/17/20 0406  AST 32 37 47* 71*  ALT 21 22 26  47*  ALKPHOS 52 50 49 43  BILITOT 0.8 1.3* 1.4* 1.6*  PROT 6.7 6.6 7.0 6.8  ALBUMIN 4.3 4.0 4.1 3.8   Recent Labs  Lab 07/13/20 1140  LIPASE 30    HbA1C: Hemoglobin A1C  Date/Time Value Ref Range Status  08/09/2017 12:00 AM 6.0  Final   Hgb A1c MFr Bld  Date/Time Value Ref Range Status  06/30/2020 11:07 AM 6.9 (H) 4.6 - 6.5 % Final    Comment:    Glycemic Control Guidelines for People with Diabetes:Non Diabetic:  <6%Goal of Therapy: <7%Additional Action  Suggested:  >8%   02/17/2019 07:07 PM 5.8 (H) 4.8 - 5.6 % Final    Comment:    (NOTE) Pre diabetes:          5.7%-6.4% Diabetes:              >  6.4% Glycemic control for   <7.0% adults with diabetes     CBG: Recent Labs  Lab 07/16/20 0850 07/16/20 1129 07/16/20 1631 07/16/20 2101 07/17/20 0741  GLUCAP 130* 132* 141* 114* 120*    Recent Results (from the past 240 hour(s))  Respiratory Panel by RT PCR (Flu A&B, Covid) - Nasopharyngeal Swab     Status: None   Collection Time: 07/13/20  7:15 PM   Specimen: Nasopharyngeal Swab  Result Value Ref Range Status   SARS Coronavirus 2 by RT PCR NEGATIVE NEGATIVE Final    Comment: (NOTE) SARS-CoV-2 target nucleic acids are NOT DETECTED.  The SARS-CoV-2 RNA is generally detectable in upper respiratoy specimens during the acute phase of infection. The lowest concentration of SARS-CoV-2 viral copies this assay can detect is 131 copies/mL. A negative result does not preclude SARS-Cov-2 infection and should not be used as the sole basis for treatment or other patient management decisions. A negative result may occur with  improper specimen collection/handling, submission of specimen other than nasopharyngeal swab, presence of viral mutation(s) within the areas targeted by this assay, and inadequate number of viral copies (<131 copies/mL). A negative result must be combined with clinical observations, patient history, and epidemiological information. The expected result is Negative.  Fact Sheet for Patients:  PinkCheek.be  Fact Sheet for Healthcare Providers:  GravelBags.it  This test is no t yet approved or cleared by the [place] FDA and  has been authorized for detection and/or diagnosis of SARS-CoV-2 by FDA under an Emergency Use Authorization (EUA). This EUA will remain  in effect (meaning this test can be used) for the duration of the COVID-19 declaration under  Section 564(b)(1) of the Act, 21 U.S.C. section 360bbb-3(b)(1), unless the authorization is terminated or revoked sooner.     Influenza A by PCR NEGATIVE NEGATIVE Final   Influenza B by PCR NEGATIVE NEGATIVE Final    Comment: (NOTE) The Xpert Xpress SARS-CoV-2/FLU/RSV assay is intended as an aid in  the diagnosis of influenza from Nasopharyngeal swab specimens and  should not be used as a sole basis for treatment. Nasal washings and  aspirates are unacceptable for Xpert Xpress SARS-CoV-2/FLU/RSV  testing.  Fact Sheet for Patients: PinkCheek.be  Fact Sheet for Healthcare Providers: GravelBags.it  This test is not yet approved or cleared by the [place] FDA and  has been authorized for detection and/or diagnosis of SARS-CoV-2 by  FDA under an Emergency Use Authorization (EUA). This EUA will remain  in effect (meaning this test can be used) for the duration of the  Covid-19 declaration under Section 564(b)(1) of the Act, 21  U.S.C. section 360bbb-3(b)(1), unless the authorization is  terminated or revoked. Performed at [facility], [place] [address] [place], [place] [address]      Scheduled Meds: . amLODipine  5 mg Oral Daily  . apixaban  5 mg Oral BID  . atorvastatin  20 mg Oral Daily  . azaTHIOprine  200 mg Oral Daily  . cyanocobalamin  1,000 mcg Subcutaneous Daily  . divalproex  250 mg Oral Q12H  . DULoxetine  60 mg Oral Daily  . haloperidol  0.5 mg Oral BID  . insulin aspart  0-5 Units Subcutaneous QHS  . insulin aspart  0-9 Units Subcutaneous TID WC  . levothyroxine  25 mcg Oral Q0600  . LORazepam  0.5 mg Oral TID  . metoprolol tartrate  50 mg Oral BID  . mirtazapine  15 mg Oral QHS  . valACYclovir  1,000 mg Oral TID  . Vitamin D (Ergocalciferol)  50,000 Units Oral Q7 days     LOS: 3 days   Cherene Altes, MD [facility] Office  [phone] Pager - Text Page per  Amion  If 7PM-7AM, please contact night-coverage per Amion 07/17/2020, 10:01 AM

## 2020-07-18 DIAGNOSIS — R9431 Abnormal electrocardiogram [ECG] [EKG]: Secondary | ICD-10-CM | POA: Diagnosis not present

## 2020-07-18 DIAGNOSIS — R451 Restlessness and agitation: Secondary | ICD-10-CM | POA: Diagnosis not present

## 2020-07-18 DIAGNOSIS — R410 Disorientation, unspecified: Secondary | ICD-10-CM | POA: Diagnosis not present

## 2020-07-18 DIAGNOSIS — R4182 Altered mental status, unspecified: Secondary | ICD-10-CM | POA: Diagnosis not present

## 2020-07-18 LAB — GLUCOSE, CAPILLARY
Glucose-Capillary: 122 mg/dL — ABNORMAL HIGH (ref 70–99)
Glucose-Capillary: 112 mg/dL — ABNORMAL HIGH (ref 70–99)
Glucose-Capillary: 135 mg/dL — ABNORMAL HIGH (ref 70–99)
Glucose-Capillary: 116 mg/dL — ABNORMAL HIGH (ref 70–99)

## 2020-07-18 MED ORDER — HALOPERIDOL 1 MG PO TABS
1.0000 mg | ORAL_TABLET | Freq: Three times a day (TID) | ORAL | Status: DC
  Administered 2020-07-18 – 2020-07-19 (×4): 1 mg via ORAL
  Filled 2020-07-18 (×5): qty 1

## 2020-07-18 NOTE — Progress Notes (Signed)
[name] [place] Sr.  ZES:[id number] DOB: 1951/07/31 DOA: 07/13/2020 PCP: [name], DO    Brief Narrative:  69yo with a history of atrial fibrillation, depression, and dementia who was brought to the ED with increasing agitation over a week as reported by his wife.  His dementia was noted to have progressed significantly over the last 6 months.  Multiple medications have been attempted in the outpatient setting to no avail.  He has been found wandering the neighborhood at night with a flashlight, and 1 day [place] without telling anyone where he was.  In the ED CT head was negative for acute findings and there was no evidence of an acute infection.  Significant Events:  11/3 admit via ED  Antimicrobials:  None  DVT prophylaxis: Eliquis  Subjective: Mild tachycardia.  Vital signs otherwise stable.  QTC 439 via EKG this AM.  The patient is sedate but is much more calm and interactive today.  He denies specific complaints and is focused on when he can go home.  Has continued to display agitation and combativeness throughout the day unfortunately.  Assessment & Plan:  Acute delirium -progressive chronic dementia Psychiatry has evaluated and made recommendations regarding medical therapy -RPR nonreactive, ethanol undetectable at admission, ammonia essentially normal, TSH normal, thiamine normal - perhaps B12 deficiency is contributing - continue to replace B12 and vitamin D -patient is due for an MRI of the brain which was scheduled as an outpatient -we will attempt to accomplish this during his hospital stay but agitation will make this difficult and he is not yet calm enough for this today -with improvement in QTC Haldol has been dosed - give trial of scheduled oral haldol - if agitation/delirium can't be corrected we will need to find a med regimen that allows him to safely be at home   ?EtOH withdrawal No known hx of alcoholism, but w/ recent depression and life  stressors is conceivable pt could have been abusing EtOH surreptitiously - cont trial of CIWA protocol and follow   B12 deficiency B12 quite low at 179 on outpt check 06/30/20 - continue to replace with high-dose B12 SQ - no apparent clinical impact thus far   Shingles Apparent across L chest/flank - maturing lesions at present - cont contact isolation - stop zovirax due to fear for worsening confusion   Vitamin D deficiency 25 hydroxy vitamin D was low at 27 as assessed on 06/30/2020 - replacing   Depression Continue usual home medications -acute situational worsening recently due to death of a very close family member which could be precipitating some of his symptoms  Hypothyroidism Continue usual home medications - TSH and FT4 within normal range   DM 2 CBG well controlled - A1c 6.0 -no hypoglycemia  HTN Blood pressure reasonably controlled  Chronic atrial fibrillation Continue usual metoprolol and chronic Eliquis - persistently elevated heart rate likely related to agitation  Prolonged QTC Electrolytes to be maximized -avoid QT prolonging medications -QTC improved to 444 11/5 - unable to keep on tele due to agitation    Code Status: FULL CODE Family Communication:  Status is: Inpatient  Remains inpatient appropriate because:Inpatient level of care appropriate due to severity of illness   Dispo: The patient is from: Home              Anticipated d/c is to: undetermined              Anticipated d/c date is: 2  days              Patient currently is not medically stable to d/c.  Consultants:  Psychiatry  Objective: Blood pressure (!) 112/91, pulse (!) 107, temperature 98.1 F (36.7 C), temperature source Oral, resp. rate 20, SpO2 96 %.  Intake/Output Summary (Last 24 hours) at 07/18/2020 0924 Last data filed at 07/18/2020 0655 Gross per 24 hour  Intake --  Output 1450 ml  Net -1450 ml   There were no vitals filed for this visit.  Examination: General: No acute  respiratory distress  Lungs: Clear to auscultation bilaterally  Cardiovascular: reg rate - irreg - no M or rub  Abdomen: NT/ND, soft, bs+, no mass  Extremities: No signif edema B lower extremities  CBC: Recent Labs  Lab 07/13/20 1140 07/14/20 0403  WBC 7.0 9.4  NEUTROABS 5.7  --   HGB 14.4 14.8  HCT 42.0 44.0  MCV 97.9 99.1  PLT 206 681   Basic Metabolic Panel: Recent Labs  Lab 07/13/20 1140 07/13/20 1351 07/14/20 0358 07/14/20 0403 07/15/20 0402 07/17/20 0406  NA   < >  --   --  133* 133* 130*  K   < >  --   --  3.5 3.5 3.6  CL   < >  --   --  97* 95* 92*  CO2   < >  --   --  25 24 26   GLUCOSE   < >  --   --  148* 140* 126*  BUN   < >  --   --  6* 7* 10  CREATININE   < >  --   --  0.96 0.96 0.96  CALCIUM   < >  --   --  9.0 9.1 9.0  MG  --  2.0  --   --   --  2.0  PHOS  --   --  2.5  --   --   --    < > = values in this interval not displayed.   GFR: Estimated Creatinine Clearance: 85.6 mL/min (by C-G formula based on SCr of 0.96 mg/dL).  Liver Function Tests: Recent Labs  Lab 07/13/20 1140 07/14/20 0403 07/15/20 0402 07/17/20 0406  AST 32 37 47* 71*  ALT 21 22 26  47*  ALKPHOS 52 50 49 43  BILITOT 0.8 1.3* 1.4* 1.6*  PROT 6.7 6.6 7.0 6.8  ALBUMIN 4.3 4.0 4.1 3.8   Recent Labs  Lab 07/13/20 1140  LIPASE 30    HbA1C: Hemoglobin A1C  Date/Time Value Ref Range Status  08/09/2017 12:00 AM 6.0  Final   Hgb A1c MFr Bld  Date/Time Value Ref Range Status  06/30/2020 11:07 AM 6.9 (H) 4.6 - 6.5 % Final    Comment:    Glycemic Control Guidelines for People with Diabetes:Non Diabetic:  <6%Goal of Therapy: <7%Additional Action Suggested:  >8%   02/17/2019 07:07 PM 5.8 (H) 4.8 - 5.6 % Final    Comment:    (NOTE) Pre diabetes:          5.7%-6.4% Diabetes:              >6.4% Glycemic control for   <7.0% adults with diabetes     CBG: Recent Labs  Lab 07/17/20 0741 07/17/20 1203 07/17/20 1727 07/17/20 2054 07/18/20 0741  GLUCAP 120* 126* 114*  111* 122*    Recent Results (from the past 240 hour(s))  Respiratory Panel by RT PCR (Flu A&B, Covid) -  Nasopharyngeal Swab     Status: None   Collection Time: 07/13/20  7:15 PM   Specimen: Nasopharyngeal Swab  Result Value Ref Range Status   SARS Coronavirus 2 by RT PCR NEGATIVE NEGATIVE Final    Comment: (NOTE) SARS-CoV-2 target nucleic acids are NOT DETECTED.  The SARS-CoV-2 RNA is generally detectable in upper respiratoy specimens during the acute phase of infection. The lowest concentration of SARS-CoV-2 viral copies this assay can detect is 131 copies/mL. A negative result does not preclude SARS-Cov-2 infection and should not be used as the sole basis for treatment or other patient management decisions. A negative result may occur with  improper specimen collection/handling, submission of specimen other than nasopharyngeal swab, presence of viral mutation(s) within the areas targeted by this assay, and inadequate number of viral copies (<131 copies/mL). A negative result must be combined with clinical observations, patient history, and epidemiological information. The expected result is Negative.  Fact Sheet for Patients:  PinkCheek.be  Fact Sheet for Healthcare Providers:  GravelBags.it  This test is no t yet approved or cleared by the [place] FDA and  has been authorized for detection and/or diagnosis of SARS-CoV-2 by FDA under an Emergency Use Authorization (EUA). This EUA will remain  in effect (meaning this test can be used) for the duration of the COVID-19 declaration under Section 564(b)(1) of the Act, 21 U.S.C. section 360bbb-3(b)(1), unless the authorization is terminated or revoked sooner.     Influenza A by PCR NEGATIVE NEGATIVE Final   Influenza B by PCR NEGATIVE NEGATIVE Final    Comment: (NOTE) The Xpert Xpress SARS-CoV-2/FLU/RSV assay is intended as an aid in  the diagnosis of influenza  from Nasopharyngeal swab specimens and  should not be used as a sole basis for treatment. Nasal washings and  aspirates are unacceptable for Xpert Xpress SARS-CoV-2/FLU/RSV  testing.  Fact Sheet for Patients: PinkCheek.be  Fact Sheet for Healthcare Providers: GravelBags.it  This test is not yet approved or cleared by the [place] FDA and  has been authorized for detection and/or diagnosis of SARS-CoV-2 by  FDA under an Emergency Use Authorization (EUA). This EUA will remain  in effect (meaning this test can be used) for the duration of the  Covid-19 declaration under Section 564(b)(1) of the Act, 21  U.S.C. section 360bbb-3(b)(1), unless the authorization is  terminated or revoked. Performed at [facility], [place] [address], [place], [place] [address]      Scheduled Meds: . amLODipine  5 mg Oral Daily  . apixaban  5 mg Oral BID  . atorvastatin  20 mg Oral Daily  . azaTHIOprine  200 mg Oral Daily  . cyanocobalamin  1,000 mcg Subcutaneous Daily  . DULoxetine  60 mg Oral Daily  . haloperidol  1 mg Oral BID  . insulin aspart  0-5 Units Subcutaneous QHS  . insulin aspart  0-9 Units Subcutaneous TID WC  . levothyroxine  25 mcg Oral Q0600  . metoprolol tartrate  50 mg Oral BID  . Vitamin D (Ergocalciferol)  50,000 Units Oral Q7 days     LOS: 4 days   Cherene Altes, MD [facility] Office  [phone] Pager - Text Page per Amion  If 7PM-7AM, please contact night-coverage per Amion 07/18/2020, 9:24 AM

## 2020-07-18 NOTE — Care Management Important Message (Signed)
Important Message  Patient Details IM Letter given to the Patient Name: [name] FOLSON Sr. MRN: [id number] Date of Birth: [birth year]   Medicare Important Message Given:  Yes     Kerin Salen 07/18/2020, 11:09 AM

## 2020-07-19 DIAGNOSIS — R9431 Abnormal electrocardiogram [ECG] [EKG]: Secondary | ICD-10-CM | POA: Diagnosis not present

## 2020-07-19 DIAGNOSIS — R451 Restlessness and agitation: Secondary | ICD-10-CM | POA: Diagnosis not present

## 2020-07-19 DIAGNOSIS — R4182 Altered mental status, unspecified: Secondary | ICD-10-CM | POA: Diagnosis not present

## 2020-07-19 DIAGNOSIS — R410 Disorientation, unspecified: Secondary | ICD-10-CM | POA: Diagnosis not present

## 2020-07-19 LAB — GLUCOSE, CAPILLARY
Glucose-Capillary: 137 mg/dL — ABNORMAL HIGH (ref 70–99)
Glucose-Capillary: 111 mg/dL — ABNORMAL HIGH (ref 70–99)
Glucose-Capillary: 106 mg/dL — ABNORMAL HIGH (ref 70–99)
Glucose-Capillary: 105 mg/dL — ABNORMAL HIGH (ref 70–99)

## 2020-07-19 LAB — BASIC METABOLIC PANEL
Anion gap: 10 (ref 5–15)
BUN: 10 mg/dL (ref 8–23)
CO2: 27 mmol/L (ref 22–32)
Calcium: 9.2 mg/dL (ref 8.9–10.3)
Chloride: 94 mmol/L — ABNORMAL LOW (ref 98–111)
Creatinine, Ser: 0.96 mg/dL (ref 0.61–1.24)
GFR, Estimated: >60 mL/min (ref >60)
Glucose, Bld: 137 mg/dL — ABNORMAL HIGH (ref 70–99)
Potassium: 3.1 mmol/L — ABNORMAL LOW (ref 3.5–5.1)
Sodium: 131 mmol/L — ABNORMAL LOW (ref 135–145)

## 2020-07-19 LAB — MAGNESIUM: Magnesium: 2.2 mg/dL (ref 1.7–2.4)

## 2020-07-19 LAB — PHOSPHORUS: Phosphorus: 3.9 mg/dL (ref 2.5–4.6)

## 2020-07-19 MED ORDER — POTASSIUM CHLORIDE CRYS ER 20 MEQ PO TBCR
40.0000 meq | EXTENDED_RELEASE_TABLET | Freq: Once | ORAL | Status: AC
  Administered 2020-07-19: 40 meq via ORAL
  Filled 2020-07-19: qty 2

## 2020-07-19 MED ORDER — HALOPERIDOL LACTATE 5 MG/ML IJ SOLN
2.0000 mg | Freq: Four times a day (QID) | INTRAMUSCULAR | Status: DC | PRN
  Administered 2020-07-19 – 2020-07-30 (×6): 2 mg via INTRAMUSCULAR
  Filled 2020-07-19 (×7): qty 1

## 2020-07-19 MED ORDER — HALOPERIDOL LACTATE 5 MG/ML IJ SOLN
4.0000 mg | Freq: Once | INTRAMUSCULAR | Status: AC
  Administered 2020-07-19: 4 mg via INTRAMUSCULAR

## 2020-07-19 MED ORDER — LORAZEPAM 2 MG/ML IJ SOLN
1.0000 mg | Freq: Four times a day (QID) | INTRAMUSCULAR | Status: DC
  Administered 2020-07-19: 1 mg via INTRAVENOUS
  Filled 2020-07-19: qty 1

## 2020-07-19 MED ORDER — LORAZEPAM 2 MG/ML IJ SOLN
1.0000 mg | Freq: Four times a day (QID) | INTRAMUSCULAR | Status: DC
  Administered 2020-07-19 – 2020-07-26 (×26): 1 mg via INTRAMUSCULAR
  Filled 2020-07-19 (×26): qty 1

## 2020-07-19 MED ORDER — QUETIAPINE FUMARATE 25 MG PO TABS
50.0000 mg | ORAL_TABLET | Freq: Every day | ORAL | Status: DC
  Administered 2020-07-19 – 2020-07-25 (×7): 50 mg via ORAL
  Filled 2020-07-19 (×7): qty 2

## 2020-07-19 NOTE — Plan of Care (Signed)
  Problem: Health Behavior/Discharge Planning: Goal: Ability to manage health-related needs will improve Outcome: Progressing   Problem: Clinical Measurements: Goal: Ability to maintain clinical measurements within normal limits will improve Outcome: Progressing Goal: Will remain free from infection Outcome: Progressing Goal: Diagnostic test results will improve Outcome: Progressing Goal: Respiratory complications will improve Outcome: Progressing Goal: Cardiovascular complication will be avoided Outcome: Progressing   Problem: Coping: Goal: Level of anxiety will decrease Outcome: Progressing   Problem: Safety: Goal: Ability to remain free from injury will improve Outcome: Progressing   Problem: Skin Integrity: Goal: Risk for impaired skin integrity will decrease Outcome: Progressing

## 2020-07-19 NOTE — TOC Progression Note (Signed)
Transition of Care (TOC) - Progression Note    Patient Details  Name: [name] [place] Sr. MRN: [id number] Date of Birth: 04-06-1951  Transition of Care [facility]) CM/SW [place], [place] Phone Number: 07/19/2020, 9:14 AM  Clinical Narrative:   Spoke with wife at length in patient's room .  [name] was sitting in the recliner, legs up, no restraints, fiddling with TV remote and eventually hitting call button, but no behavioral issues. [name] states her plan is to take patient home.  There is a caregiver in home for her mother who can call for help during the day if need be.  [name] plans to retire the end of the month so she can be at home full time with both family members who need care.  She has a son in the area who is able to help out one day a week.  I talked with her about applying for MCD either through DSS, disability or [employer] so that she has access to both more in-home services as well as potential placement.  No further needs identified. TOC will continue to follow during the course of hospitalization.     Expected Discharge Plan: [place] Barriers to Discharge: Barriers Resolved  Expected Discharge Plan and Services Expected Discharge Plan: [place] In-house Referral: Clinical Social Work Discharge Planning Services: CM Consult Post Acute Care Choice: [place] arrangements for the past 2 months: Single Family Home                                       Social Determinants of Health (SDOH) Interventions    Readmission Risk Interventions Readmission Risk Prevention Plan 02/18/2019  Transportation Screening Complete  PCP or Specialist Appt within 3-5 Days Complete  HRI or [place] Complete  Social Work Consult for [place] Planning/Counseling Complete  Palliative Care Screening Not Applicable  Medication Review Press photographer) Complete  Some recent data might be hidden

## 2020-07-19 NOTE — Progress Notes (Signed)
[name] Sr.  RKY:[id number] DOB: [birth year] DOA: 07/13/2020 PCP: [name], DO    Brief Narrative:  69yo with a history of atrial fibrillation, depression, and dementia who was brought to the ED with increasing agitation over a week as reported by his wife.  His dementia was noted to have progressed significantly over the last 6 months.  Multiple medications have been attempted in the outpatient setting to no avail.  He has been found wandering the neighborhood at night with a flashlight, and 1 day [place] without telling anyone where he was.  In the ED CT head was negative for acute findings and there was no evidence of an acute infection.  Significant Events:  11/3 admit via ED  Antimicrobials:  None  DVT prophylaxis: Eliquis  Subjective: Despite high-dose Ativan and ongoing use of Haldol the patient's agitation only transiently improved and now has recurred such that no significant lasting progress has been made.  He has been very agitated today and again was striking out at staff and required return to soft restraints. He has not yet stabilized to the point that an MRI is reasonable.  He has not yet stabilized to a point that his wife could safely care for him at home.  I discussed his condition at length with the patient's wife at the bedside.  I have encouraged her to make her visit short and perhaps less frequent to allow him to rest more and hopefully assist in his recovery.  He seems very interested in interacting with her when she is present.  Assessment & Plan:  Acute delirium -progressive chronic dementia Psychiatry evaluated and made recommendations regarding medical therapy -RPR nonreactive, ethanol undetectable at admission, ammonia essentially normal, TSH normal, thiamine normal -treatment of B12 deficiency has had no significant impact - patient is due for an MRI of the brain which was scheduled as an outpatient -we will attempt to  accomplish this during his hospital stay but agitation will make this difficult and he is not yet calm enough for this -with improvement in QTC Haldol has been dosed - recheck EKG in a.m. -with escalating doses of Ativan still failing to treat his agitation I have decided to discontinue Ativan with exception to a low scheduled dose as he was on chronic Ativan at home -I will attempt to minimize his sedating meds to assure that he is not now delirious because of medications -should there be no improvement in his mental state tomorrow we will consider initiating treatment for possible zoster encephalitis, though I think this is rather unlikely and will necessitate a consistent IV which has been problematic  ?EtOH withdrawal No known hx of alcoholism, but w/ recent depression and life stressors it was felt conceivable pt could have been abusing EtOH surreptitiously - a trial on the CIWA protocol did not prove to be helpful   B12 deficiency B12 quite low at 179 on outpt check 06/30/20 - continue to replace with high-dose B12 SQ x 7 days - no apparent clinical impact thus far   Shingles Apparent across L chest/flank - maturing lesions at present - cont contact isolation - stopped oral zovirax due to fear for worsening confusion -if agitation/confusion persist will consider initiating IV acyclovir +/-steroid as empiric treatment for zoster encephalitis, though I think this is unlikely  Vitamin D deficiency 25 hydroxy vitamin D was low at 27 as assessed on 06/30/2020 - replacing   Depression Continue usual home medications -acute  situational worsening recently due to death of a very close family member which could be precipitating some of his symptoms  Hypothyroidism Continue usual home medications - TSH and FT4 within normal range   DM 2 CBG well controlled - A1c 6.0 -no hypoglycemia  Hypokalemia Due to inconsistent intake -supplement and follow  HTN Blood pressure reasonably  controlled  Chronic atrial fibrillation Continue usual metoprolol and chronic Eliquis - persistently elevated heart rate likely related to agitation -if we are not able to find a way to consistently and reliably control his agitation we will need to consider discontinuing his Eliquis prior to discharge home as he will be at high risk for injury and life-threatening bleeding  Prolonged QTC Electrolytes to be maximized -avoid QT prolonging medications -QTC improved to 444 on 11/5 and 439 on 11/8 - unable to keep on tele due to agitation    Code Status: FULL CODE Family Communication: Spoke with wife at bedside at length Status is: Inpatient  Remains inpatient appropriate because:Inpatient level of care appropriate due to severity of illness   Dispo: The patient is from: Home              Anticipated d/c is to: undetermined              Anticipated d/c date is: 2 days              Patient currently is not medically stable to d/c.  Consultants:  Psychiatry  Objective: Blood pressure 117/89, pulse 82, temperature 98.1 F (36.7 C), temperature source Oral, resp. rate 18, SpO2 93 %.  Intake/Output Summary (Last 24 hours) at 07/19/2020 0919 Last data filed at 07/19/2020 8850 Gross per 24 hour  Intake 960 ml  Output 875 ml  Net 85 ml   There were no vitals filed for this visit.  Examination: General: No acute respiratory distress -alert but very agitated -unable to be corrected with conversation -maturing shingles rash across left anterior and lateral chest Lungs: Clear to auscultation bilaterally without wheezing Cardiovascular: reg rate - irreg - no M or rub  Abdomen: NT/ND, soft, bs+, no mass  Extremities: Trace edema bilateral lower extremities  CBC: Recent Labs  Lab 07/13/20 1140 07/14/20 0403  WBC 7.0 9.4  NEUTROABS 5.7  --   HGB 14.4 14.8  HCT 42.0 44.0  MCV 97.9 99.1  PLT 206 277   Basic Metabolic Panel: Recent Labs  Lab 07/13/20 1351 07/14/20 0358  07/14/20 0403 07/15/20 0402 07/17/20 0406 07/19/20 0407  NA  --   --    < > 133* 130* 131*  K  --   --    < > 3.5 3.6 3.1*  CL  --   --    < > 95* 92* 94*  CO2  --   --    < > 24 26 27   GLUCOSE  --   --    < > 140* 126* 137*  BUN  --   --    < > 7* 10 10  CREATININE  --   --    < > 0.96 0.96 0.96  CALCIUM  --   --    < > 9.1 9.0 9.2  MG 2.0  --   --   --  2.0 2.2  PHOS  --  2.5  --   --   --  3.9   < > = values in this interval not displayed.   GFR: Estimated Creatinine Clearance: 85.6 mL/min (by  C-G formula based on SCr of 0.96 mg/dL).  Liver Function Tests: Recent Labs  Lab 07/13/20 1140 07/14/20 0403 07/15/20 0402 07/17/20 0406  AST 32 37 47* 71*  ALT 21 22 26  47*  ALKPHOS 52 50 49 43  BILITOT 0.8 1.3* 1.4* 1.6*  PROT 6.7 6.6 7.0 6.8  ALBUMIN 4.3 4.0 4.1 3.8   Recent Labs  Lab 07/13/20 1140  LIPASE 30    HbA1C: Hemoglobin A1C  Date/Time Value Ref Range Status  08/09/2017 12:00 AM 6.0  Final   Hgb A1c MFr Bld  Date/Time Value Ref Range Status  06/30/2020 11:07 AM 6.9 (H) 4.6 - 6.5 % Final    Comment:    Glycemic Control Guidelines for People with Diabetes:Non Diabetic:  <6%Goal of Therapy: <7%Additional Action Suggested:  >8%   02/17/2019 07:07 PM 5.8 (H) 4.8 - 5.6 % Final    Comment:    (NOTE) Pre diabetes:          5.7%-6.4% Diabetes:              >6.4% Glycemic control for   <7.0% adults with diabetes     CBG: Recent Labs  Lab 07/18/20 0741 07/18/20 1141 07/18/20 1619 07/18/20 2100 07/19/20 0735  GLUCAP 122* 112* 135* 116* 137*    Recent Results (from the past 240 hour(s))  Respiratory Panel by RT PCR (Flu A&B, Covid) - Nasopharyngeal Swab     Status: None   Collection Time: 07/13/20  7:15 PM   Specimen: Nasopharyngeal Swab  Result Value Ref Range Status   SARS Coronavirus 2 by RT PCR NEGATIVE NEGATIVE Final    Comment: (NOTE) SARS-CoV-2 target nucleic acids are NOT DETECTED.  The SARS-CoV-2 RNA is generally detectable in upper  respiratoy specimens during the acute phase of infection. The lowest concentration of SARS-CoV-2 viral copies this assay can detect is 131 copies/mL. A negative result does not preclude SARS-Cov-2 infection and should not be used as the sole basis for treatment or other patient management decisions. A negative result may occur with  improper specimen collection/handling, submission of specimen other than nasopharyngeal swab, presence of viral mutation(s) within the areas targeted by this assay, and inadequate number of viral copies (<131 copies/mL). A negative result must be combined with clinical observations, patient history, and epidemiological information. The expected result is Negative.  Fact Sheet for Patients:  PinkCheek.be  Fact Sheet for Healthcare Providers:  GravelBags.it  This test is no t yet approved or cleared by the [place] FDA and  has been authorized for detection and/or diagnosis of SARS-CoV-2 by FDA under an Emergency Use Authorization (EUA). This EUA will remain  in effect (meaning this test can be used) for the duration of the COVID-19 declaration under Section 564(b)(1) of the Act, 21 U.S.C. section 360bbb-3(b)(1), unless the authorization is terminated or revoked sooner.     Influenza A by PCR NEGATIVE NEGATIVE Final   Influenza B by PCR NEGATIVE NEGATIVE Final    Comment: (NOTE) The Xpert Xpress SARS-CoV-2/FLU/RSV assay is intended as an aid in  the diagnosis of influenza from Nasopharyngeal swab specimens and  should not be used as a sole basis for treatment. Nasal washings and  aspirates are unacceptable for Xpert Xpress SARS-CoV-2/FLU/RSV  testing.  Fact Sheet for Patients: PinkCheek.be  Fact Sheet for Healthcare Providers: GravelBags.it  This test is not yet approved or cleared by the [place] FDA and  has been  authorized for detection and/or diagnosis of SARS-CoV-2 by  FDA under  an Emergency Use Authorization (EUA). This EUA will remain  in effect (meaning this test can be used) for the duration of the  Covid-19 declaration under Section 564(b)(1) of the Act, 21  U.S.C. section 360bbb-3(b)(1), unless the authorization is  terminated or revoked. Performed at [facility], [place] [address] [place], [place] [address]      Scheduled Meds: . amLODipine  5 mg Oral Daily  . apixaban  5 mg Oral BID  . atorvastatin  20 mg Oral Daily  . azaTHIOprine  200 mg Oral Daily  . cyanocobalamin  1,000 mcg Subcutaneous Daily  . DULoxetine  60 mg Oral Daily  . haloperidol  1 mg Oral TID  . insulin aspart  0-5 Units Subcutaneous QHS  . insulin aspart  0-9 Units Subcutaneous TID WC  . levothyroxine  25 mcg Oral Q0600  . metoprolol tartrate  50 mg Oral BID  . Vitamin D (Ergocalciferol)  50,000 Units Oral Q7 days     LOS: 5 days   Cherene Altes, MD [facility] Office  [phone] Pager - Text Page per Amion  If 7PM-7AM, please contact night-coverage per Amion 07/19/2020, 9:19 AM

## 2020-07-19 NOTE — Progress Notes (Signed)
Patient has gotten progressively more aggressive and violent since 0700.  1200- Patient became more aggressive toward staff and started making sexual comments/grabbing staff inappropriately. Medications given to calm patient down were not effective.  1300- Dr. Thereasa Solo paged at this time. Dr. Thereasa Solo put 1x order for haldol.  1330- patient placed in 4 point restraints due to aggression/ aggitation interfering with patient care, spitting on the floor, and swinging at staff members. 73- Wife made aware.

## 2020-07-20 DIAGNOSIS — E538 Deficiency of other specified B group vitamins: Secondary | ICD-10-CM | POA: Diagnosis not present

## 2020-07-20 DIAGNOSIS — F0391 Unspecified dementia with behavioral disturbance: Secondary | ICD-10-CM | POA: Diagnosis not present

## 2020-07-20 DIAGNOSIS — R410 Disorientation, unspecified: Secondary | ICD-10-CM | POA: Diagnosis not present

## 2020-07-20 LAB — COMPREHENSIVE METABOLIC PANEL
ALT: 53 U/L — ABNORMAL HIGH (ref 0–44)
AST: 68 U/L — ABNORMAL HIGH (ref 15–41)
Albumin: 3.7 g/dL (ref 3.5–5.0)
Alkaline Phosphatase: 49 U/L (ref 38–126)
Anion gap: 10 (ref 5–15)
BUN: 11 mg/dL (ref 8–23)
CO2: 26 mmol/L (ref 22–32)
Calcium: 8.9 mg/dL (ref 8.9–10.3)
Chloride: 95 mmol/L — ABNORMAL LOW (ref 98–111)
Creatinine, Ser: 0.88 mg/dL (ref 0.61–1.24)
GFR, Estimated: >60 mL/min (ref >60)
Glucose, Bld: 110 mg/dL — ABNORMAL HIGH (ref 70–99)
Potassium: 3.4 mmol/L — ABNORMAL LOW (ref 3.5–5.1)
Sodium: 131 mmol/L — ABNORMAL LOW (ref 135–145)
Total Bilirubin: 1.4 mg/dL — ABNORMAL HIGH (ref 0.3–1.2)
Total Protein: 6.8 g/dL (ref 6.5–8.1)

## 2020-07-20 LAB — GLUCOSE, CAPILLARY
Glucose-Capillary: 110 mg/dL — ABNORMAL HIGH (ref 70–99)
Glucose-Capillary: 132 mg/dL — ABNORMAL HIGH (ref 70–99)
Glucose-Capillary: 106 mg/dL — ABNORMAL HIGH (ref 70–99)
Glucose-Capillary: 124 mg/dL — ABNORMAL HIGH (ref 70–99)

## 2020-07-20 LAB — MAGNESIUM: Magnesium: 2.1 mg/dL (ref 1.7–2.4)

## 2020-07-20 LAB — AMMONIA: Ammonia: 12 umol/L (ref 9–35)

## 2020-07-20 LAB — CORTISOL: Cortisol, Plasma: 13.6 ug/dL

## 2020-07-20 MED ORDER — LORAZEPAM 2 MG/ML IJ SOLN
2.0000 mg | INTRAMUSCULAR | Status: DC | PRN
  Administered 2020-07-21: 2 mg via INTRAMUSCULAR
  Filled 2020-07-20: qty 1

## 2020-07-20 MED ORDER — POTASSIUM CHLORIDE CRYS ER 20 MEQ PO TBCR
40.0000 meq | EXTENDED_RELEASE_TABLET | Freq: Two times a day (BID) | ORAL | Status: AC
  Administered 2020-07-20 (×2): 40 meq via ORAL
  Filled 2020-07-20 (×2): qty 2

## 2020-07-20 NOTE — TOC Progression Note (Signed)
Transition of Care (TOC) - Progression Note    Patient Details  Name: [name] TURMAN Sr. MRN: [id number] Date of Birth: Jan 26, 1951  Transition of Care [facility]) CM/SW [place], [place] Phone Number: 07/20/2020, 2:43 PM  Clinical Narrative:  Patient's initial IVC paperwork expires today.  Patient was reIVC'd. Due again on 06/25/20. TOC will continue to follow during the course of hospitalization.      Expected Discharge Plan: [place] Barriers to Discharge: Barriers Resolved  Expected Discharge Plan and Services Expected Discharge Plan: [place] In-house Referral: Clinical Social Work Discharge Planning Services: CM Consult Post Acute Care Choice: [place] arrangements for the past 2 months: Single Family Home                                       Social Determinants of Health (SDOH) Interventions    Readmission Risk Interventions Readmission Risk Prevention Plan 02/18/2019  Transportation Screening Complete  PCP or Specialist Appt within 3-5 Days Complete  HRI or [place] Complete  Social Work Consult for [place] Planning/Counseling Complete  Palliative Care Screening Not Applicable  Medication Review Press photographer) Complete  Some recent data might be hidden

## 2020-07-20 NOTE — Plan of Care (Signed)
  Problem: Health Behavior/Discharge Planning: Goal: Ability to manage health-related needs will improve Outcome: Progressing   Problem: Clinical Measurements: Goal: Ability to maintain clinical measurements within normal limits will improve Outcome: Progressing Goal: Will remain free from infection Outcome: Progressing Goal: Diagnostic test results will improve Outcome: Progressing Goal: Respiratory complications will improve Outcome: Progressing Goal: Cardiovascular complication will be avoided Outcome: Progressing   Problem: Coping: Goal: Level of anxiety will decrease Outcome: Progressing   Problem: Safety: Goal: Ability to remain free from injury will improve Outcome: Progressing   Problem: Skin Integrity: Goal: Risk for impaired skin integrity will decrease Outcome: Progressing

## 2020-07-20 NOTE — Progress Notes (Signed)
[name] Colee McKenna Sr.  NOM:[id number] DOB: 1950/12/19 DOA: 07/13/2020 PCP: [name], DO    Brief Narrative:  69yo with a history of atrial fibrillation, depression, and dementia who was brought to the ED with increasing agitation over a week as reported by his wife.  His dementia was noted to have progressed significantly over the last 6 months.  Multiple medications have been attempted in the outpatient setting to no avail.  He has been found wandering the neighborhood at night with a flashlight, and 1 [place] anyone where he was.  In the ED CT head was negative for acute findings and there was no evidence of an acute infection.  Significant Events:  11/3 admit via ED  Antimicrobials:  None  DVT prophylaxis: Eliquis  Subjective: Patient remains distracted this morning.  Noted to be in restraints.  Sitter is at the bedside.     Assessment & Plan:  Acute delirium -progressive chronic dementia Psychiatry evaluated and made recommendations regarding medical therapy -RPR nonreactive, ethanol undetectable at admission, ammonia essentially normal, TSH normal, thiamine normal -treatment of B12 deficiency has had no significant impact Patient remains delirious.  Due to prolonged QTC antipsychotics could not be given initially.  Proved.  He was given Haldol without much improvement in his mentation.  He was started on Seroquel as well.  QTC noted to be 503 this morning.  Continue for now.  Will not increase the dose any further.   He was also initially placed on Ativan.  This has been modified as well.   Plan was also to consider MRI however unlikely patient will be able to cooperate enough to undergo MRI.  He does not have any focal deficits.  Unlikely that doing MRI will change our treatment strategy.  Time of admission did not show any acute findings.  This presentation is unlikely to be due to zoster encephalitis.  Likely progression of his  dementia.  Cortisol level noted to be normal.  Concern for alcohol withdrawal  No known hx of alcoholism, but w/ recent depression and life stressors it was felt conceivable pt could have been abusing EtOH surreptitiously - a trial on the CIWA protocol did not prove to be helpful   B12 deficiency B12 quite low at 179 on outpt check 06/30/20 - continue to replace with high-dose B12 SQ x 7 days followed by once daily orally.  Herpes zoster He has lesions across to his left chest and flank area.  Continues to have a few vesicles.  Continue contact isolation.  He was initially placed on Zovirax but was discontinued due to worsening confusion.  Vitamin D deficiency 25 hydroxy vitamin D was low at 60 as assessed on 06/30/2020 - replacing   Depression Continue usual home medications -acute situational worsening recently due to death of a very close family member which could be precipitating some of his symptoms  Hypothyroidism Continue usual home medications - TSH and FT4 within normal range   DM 2 CBGs are reasonably well controlled.  HbA1c 6.0.    Hypokalemia/hyponatremia Will be supplemented.  Magnesium 2.1.  Essential hypertension Blood pressure reasonably controlled  Chronic atrial fibrillation  Continue usual metoprolol and chronic Eliquis. Persistently elevated heart rate likely related to agitation.  If his mentation does not return to baseline then may have to discontinue his anticoagulation as his risk of having an adverse effect, which in this case would be bleeding as a result of a  fall, would be much higher than benefits.  Prolonged QTC Continue to replace electrolytes.  Monitor EKG every so often.     Code Status: FULL CODE Family Communication: Spoke with wife at bedside at length Status is: Inpatient  Remains inpatient appropriate because:Inpatient level of care appropriate due to severity of illness   Dispo: The patient is from: Home              Anticipated d/c  is to: undetermined              Anticipated d/c date is: 2 days              Patient currently is not medically stable to d/c.  Consultants:  Psychiatry  Objective: Blood pressure (!) 150/76, pulse 61, temperature 98.6 F (37 C), temperature source Oral, resp. rate 19, height 5' 7"  (1.702 m), weight 115.2 kg, SpO2 96 %.  Intake/Output Summary (Last 24 hours) at 07/20/2020 1227 Last data filed at 07/20/2020 1222 Gross per 24 hour  Intake 920 ml  Output 600 ml  Net 320 ml   Filed Weights   07/20/20 0922  Weight: 115.2 kg    Examination:  General appearance: Awake alert..  Delirious. Resp: Clear to auscultation bilaterally.  Normal effort Cardio: S1-S2 is normal regular.  No S3-S4.  No rubs murmurs or bruit GI: Abdomen is soft.  Nontender nondistended.  Bowel sounds are present normal.  No masses organomegaly Extremities: No edema.  Noted to be moving all his extremities. Neurologic: No focal neurological deficits.    CBC: Recent Labs  Lab 07/14/20 0403  WBC 9.4  HGB 14.8  HCT 44.0  MCV 99.1  PLT 025   Basic Metabolic Panel: Recent Labs  Lab 07/14/20 0358 07/14/20 0403 07/17/20 0406 07/19/20 0407 07/20/20 0421  NA  --    < > 130* 131* 131*  K  --    < > 3.6 3.1* 3.4*  CL  --    < > 92* 94* 95*  CO2  --    < > 26 27 26   GLUCOSE  --    < > 126* 137* 110*  BUN  --    < > 10 10 11   CREATININE  --    < > 0.96 0.96 0.88  CALCIUM  --    < > 9.0 9.2 8.9  MG  --   --  2.0 2.2 2.1  PHOS 2.5  --   --  3.9  --    < > = values in this interval not displayed.   GFR: Estimated Creatinine Clearance: 93.3 mL/min (by C-G formula based on SCr of 0.88 mg/dL).  Liver Function Tests: Recent Labs  Lab 07/14/20 0403 07/15/20 0402 07/17/20 0406 07/20/20 0421  AST 37 47* 71* 68*  ALT 22 26 47* 53*  ALKPHOS 50 49 43 49  BILITOT 1.3* 1.4* 1.6* 1.4*  PROT 6.6 7.0 6.8 6.8  ALBUMIN 4.0 4.1 3.8 3.7   No results for input(s): LIPASE, AMYLASE in the last 168  hours.  HbA1C: Hemoglobin A1C  Date/Time Value Ref Range Status  08/09/2017 12:00 AM 6.0  Final   Hgb A1c MFr Bld  Date/Time Value Ref Range Status  06/30/2020 11:07 AM 6.9 (H) 4.6 - 6.5 % Final    Comment:    Glycemic Control Guidelines for People with Diabetes:Non Diabetic:  <6%Goal of Therapy: <7%Additional Action Suggested:  >8%   02/17/2019 07:07 PM 5.8 (H) 4.8 - 5.6 % Final  Comment:    (NOTE) Pre diabetes:          5.7%-6.4% Diabetes:              >6.4% Glycemic control for   <7.0% adults with diabetes     CBG: Recent Labs  Lab 07/19/20 1133 07/19/20 1650 07/19/20 2018 07/20/20 0751 07/20/20 1046  GLUCAP 111* 106* 105* 110* 132*    Recent Results (from the past 240 hour(s))  Respiratory Panel by RT PCR (Flu A&B, Covid) - Nasopharyngeal Swab     Status: None   Collection Time: 07/13/20  7:15 PM   Specimen: Nasopharyngeal Swab  Result Value Ref Range Status   SARS Coronavirus 2 by RT PCR NEGATIVE NEGATIVE Final    Comment: (NOTE) SARS-CoV-2 target nucleic acids are NOT DETECTED.  The SARS-CoV-2 RNA is generally detectable in upper respiratoy specimens during the acute phase of infection. The lowest concentration of SARS-CoV-2 viral copies this assay can detect is 131 copies/mL. A negative result does not preclude SARS-Cov-2 infection and should not be used as the sole basis for treatment or other patient management decisions. A negative result may occur with  improper specimen collection/handling, submission of specimen other than nasopharyngeal swab, presence of viral mutation(s) within the areas targeted by this assay, and inadequate number of viral copies (<131 copies/mL). A negative result must be combined with clinical observations, patient history, and epidemiological information. The expected result is Negative.  Fact Sheet for Patients:  PinkCheek.be  Fact Sheet for Healthcare Providers:   GravelBags.it  This test is no t yet approved or cleared by the [place] FDA and  has been authorized for detection and/or diagnosis of SARS-CoV-2 by FDA under an Emergency Use Authorization (EUA). This EUA will remain  in effect (meaning this test can be used) for the duration of the COVID-19 declaration under Section 564(b)(1) of the Act, 21 U.S.C. section 360bbb-3(b)(1), unless the authorization is terminated or revoked sooner.     Influenza A by PCR NEGATIVE NEGATIVE Final   Influenza B by PCR NEGATIVE NEGATIVE Final    Comment: (NOTE) The Xpert Xpress SARS-CoV-2/FLU/RSV assay is intended as an aid in  the diagnosis of influenza from Nasopharyngeal swab specimens and  should not be used as a sole basis for treatment. Nasal washings and  aspirates are unacceptable for Xpert Xpress SARS-CoV-2/FLU/RSV  testing.  Fact Sheet for Patients: PinkCheek.be  Fact Sheet for Healthcare Providers: GravelBags.it  This test is not yet approved or cleared by the [place] FDA and  has been authorized for detection and/or diagnosis of SARS-CoV-2 by  FDA under an Emergency Use Authorization (EUA). This EUA will remain  in effect (meaning this test can be used) for the duration of the  Covid-19 declaration under Section 564(b)(1) of the Act, 21  U.S.C. section 360bbb-3(b)(1), unless the authorization is  terminated or revoked. Performed at [facility], [place] [address], [place], [place] [address]      Scheduled Meds: . amLODipine  5 mg Oral Daily  . apixaban  5 mg Oral BID  . atorvastatin  20 mg Oral Daily  . azaTHIOprine  200 mg Oral Daily  . cyanocobalamin  1,000 mcg Subcutaneous Daily  . DULoxetine  60 mg Oral Daily  . insulin aspart  0-5 Units Subcutaneous QHS  . insulin aspart  0-9 Units Subcutaneous TID WC  . levothyroxine  25 mcg Oral Q0600  . LORazepam  1 mg  Intramuscular Q6H  . metoprolol tartrate  50 mg Oral BID  . QUEtiapine  50 mg Oral QHS  . Vitamin D (Ergocalciferol)  50,000 Units Oral Q7 days     LOS: 6 days   [place] [facility] Office  [phone] Pager - Text Page per Shea Evans  If 7PM-7AM, please contact night-coverage per Amion 07/20/2020, 12:27 PM

## 2020-07-20 NOTE — Progress Notes (Signed)
1500- patient very restless, agitated, and delusional while in his bed. Pt was walked in the hallway for 20 minutes, which appeared to calm pt down. Pt went back to bed and slept for 1 hour.  1700- patient agitated and restless again. Pt walked in the hallway for 20 minutes and calmed down enough to eat dinner.

## 2020-07-21 DIAGNOSIS — R410 Disorientation, unspecified: Secondary | ICD-10-CM | POA: Diagnosis not present

## 2020-07-21 DIAGNOSIS — F0391 Unspecified dementia with behavioral disturbance: Secondary | ICD-10-CM | POA: Diagnosis not present

## 2020-07-21 DIAGNOSIS — E538 Deficiency of other specified B group vitamins: Secondary | ICD-10-CM | POA: Diagnosis not present

## 2020-07-21 LAB — GLUCOSE, CAPILLARY
Glucose-Capillary: 121 mg/dL — ABNORMAL HIGH (ref 70–99)
Glucose-Capillary: 131 mg/dL — ABNORMAL HIGH (ref 70–99)
Glucose-Capillary: 123 mg/dL — ABNORMAL HIGH (ref 70–99)
Glucose-Capillary: 91 mg/dL (ref 70–99)

## 2020-07-21 LAB — BASIC METABOLIC PANEL
Anion gap: 9 (ref 5–15)
BUN: 11 mg/dL (ref 8–23)
CO2: 26 mmol/L (ref 22–32)
Calcium: 8.9 mg/dL (ref 8.9–10.3)
Chloride: 97 mmol/L — ABNORMAL LOW (ref 98–111)
Creatinine, Ser: 0.86 mg/dL (ref 0.61–1.24)
GFR, Estimated: >60 mL/min (ref >60)
Glucose, Bld: 114 mg/dL — ABNORMAL HIGH (ref 70–99)
Potassium: 4.2 mmol/L (ref 3.5–5.1)
Sodium: 132 mmol/L — ABNORMAL LOW (ref 135–145)

## 2020-07-21 MED ORDER — VITAMIN B-12 1000 MCG PO TABS
1000.0000 ug | ORAL_TABLET | Freq: Every day | ORAL | Status: DC
  Administered 2020-07-21 – 2020-09-09 (×51): 1000 ug via ORAL
  Filled 2020-07-21 (×51): qty 1

## 2020-07-21 NOTE — Progress Notes (Addendum)
[name].  SVX:[id number] DOB: 1950/09/21 DOA: 07/13/2020 PCP: [name], DO    Brief Narrative:  69yo with a history of atrial fibrillation, depression, and dementia who was brought to the ED with increasing agitation over a week as reported by his wife.  His dementia was noted to have progressed significantly over the last 6 months.  Multiple medications have been attempted in the outpatient setting to no avail.  He has been found wandering the neighborhood at night with a flashlight, and 1 [place] anyone where he was.  In the ED CT head was negative for acute findings and there was no evidence of an acute infection.  Significant Events:  11/3 admit via ED  Antimicrobials:  None  DVT prophylaxis: Eliquis  Subjective: Patient remains distracted as before.  No complaints offered.  Overnight events noted.   Assessment & Plan:  Acute delirium -progressive chronic dementia Psychiatry evaluated and made recommendations regarding medical therapy -RPR nonreactive, ethanol undetectable at admission, ammonia essentially normal, TSH normal, thiamine normal -treatment of B12 deficiency has had no significant impact Looks like he was on Tegretol for a few days as recommended by psychiatry but it was discontinued. Patient remains delirious.  Due to prolonged QTC antipsychotics could not be given initially.  Proved.  He was given Haldol without much improvement in his mentation.  He was started on Seroquel as well.  QTC noted to be 503 on 11/10.  Seroquel was continued.  No dose changes were made. Continue Ativan as well.  Hold for sedation. Plan was also to consider MRI however unlikely patient will be able to cooperate enough to undergo MRI.  He does not have any focal deficits.  Unlikely that doing MRI will change our treatment strategy.  CT scan of the head at the time of admission did not show any acute findings.  This presentation is  unlikely to be due to zoster encephalitis.  Likely progression of his dementia.  Cortisol level noted to be normal.  Concern for alcohol withdrawal  No known hx of alcoholism, but w/ recent depression and life stressors it was felt conceivable pt could have been abusing EtOH surreptitiously. A trial on the CIWA protocol did not prove to be helpful.  No withdrawal symptoms noted.  B12 deficiency B12 quite low at 179 on outpt check 06/30/20.  Received vitamin B12 subcutaneously daily for 7 days.  We will start oral supplementation.  Herpes zoster He has lesions across to his left chest and flank area.  Continues to have a few vesicles.  Continue contact isolation.  He was initially placed on Zovirax but was discontinued due to worsening confusion.  Vitamin D deficiency 25 hydroxy vitamin D was low at 76 as assessed on 06/30/2020.  Being replaced.  Depression Continue usual home medications. Acute situational worsening recently due to death of a very close family member which could be precipitating some of his symptoms  Hypothyroidism Continue usual home medications - TSH and FT4 within normal range   DM 2 CBGs are reasonably well controlled.  HbA1c 6.0.    Hypokalemia/hyponatremia Potassium is better today.  Magnesium was 2.1 yesterday.  Essential hypertension Blood pressure reasonably controlled  Chronic atrial fibrillation  Continue usual metoprolol and chronic Eliquis. Persistently elevated heart rate likely related to agitation.  If his mentation does not return to baseline then may have to discontinue his anticoagulation as his risk of having an adverse effect,  which in this case would be bleeding as a result of a fall, would be much higher than benefits.  Prolonged QTC Continue to replace electrolytes.  Monitor EKG every so often.    DVT prophylaxis: Noted to be on Eliquis. Code Status: FULL CODE Family Communication: No family at bedside Disposition: Hopefully return home  when mentation has improved.  He is currently involuntarily committed.  Status is: Inpatient  Remains inpatient appropriate because:Inpatient level of care appropriate due to severity of illness   Dispo: The patient is from: Home              Anticipated d/c is to: undetermined              Anticipated d/c date is: 2 days              Patient currently is not medically stable to d/c.  Consultants:  Psychiatry  Objective: Blood pressure (!) 126/93, pulse 99, temperature 98.2 F (36.8 C), temperature source Oral, resp. rate 15, height 5' 7"  (1.702 m), weight 115.2 kg, SpO2 93 %.  Intake/Output Summary (Last 24 hours) at 07/21/2020 1106 Last data filed at 07/21/2020 0900 Gross per 24 hour  Intake 660 ml  Output 400 ml  Net 260 ml   Filed Weights   07/20/20 0922  Weight: 115.2 kg    Examination:  General appearance: Awake alert.  Remains confused Resp: Clear to auscultation bilaterally.  Normal effort Cardio: S1-S2 is normal regular.  No S3-S4.  No rubs murmurs or bruit GI: Abdomen is soft.  Nontender nondistended.  Bowel sounds are present normal.  No masses organomegaly Extremities: No edema.  Noted to be moving all his extremities Neurologic: Remains disoriented.  No focal neurological deficits.      CBC: No results for input(s): WBC, NEUTROABS, HGB, HCT, MCV, PLT in the last 168 hours. Basic Metabolic Panel: Recent Labs  Lab 07/17/20 0406 07/17/20 0406 07/19/20 0407 07/20/20 0421 07/21/20 0416  NA 130*   < > 131* 131* 132*  K 3.6   < > 3.1* 3.4* 4.2  CL 92*   < > 94* 95* 97*  CO2 26   < > 27 26 26   GLUCOSE 126*   < > 137* 110* 114*  BUN 10   < > 10 11 11   CREATININE 0.96   < > 0.96 0.88 0.86  CALCIUM 9.0   < > 9.2 8.9 8.9  MG 2.0  --  2.2 2.1  --   PHOS  --   --  3.9  --   --    < > = values in this interval not displayed.   GFR: Estimated Creatinine Clearance: 95.5 mL/min (by C-G formula based on SCr of 0.86 mg/dL).  Liver Function Tests: Recent  Labs  Lab 07/15/20 0402 07/17/20 0406 07/20/20 0421  AST 47* 71* 68*  ALT 26 47* 53*  ALKPHOS 49 43 49  BILITOT 1.4* 1.6* 1.4*  PROT 7.0 6.8 6.8  ALBUMIN 4.1 3.8 3.7    HbA1C: Hemoglobin A1C  Date/Time Value Ref Range Status  08/09/2017 12:00 AM 6.0  Final   Hgb A1c MFr Bld  Date/Time Value Ref Range Status  06/30/2020 11:07 AM 6.9 (H) 4.6 - 6.5 % Final    Comment:    Glycemic Control Guidelines for People with Diabetes:Non Diabetic:  <6%Goal of Therapy: <7%Additional Action Suggested:  >8%   02/17/2019 07:07 PM 5.8 (H) 4.8 - 5.6 % Final    Comment:    (  NOTE) Pre diabetes:          5.7%-6.4% Diabetes:              >6.4% Glycemic control for   <7.0% adults with diabetes     CBG: Recent Labs  Lab 07/20/20 0751 07/20/20 1046 07/20/20 1756 07/20/20 2129 07/21/20 0729  GLUCAP 110* 132* 106* 124* 121*    Recent Results (from the past 240 hour(s))  Respiratory Panel by RT PCR (Flu A&B, Covid) - Nasopharyngeal Swab     Status: None   Collection Time: 07/13/20  7:15 PM   Specimen: Nasopharyngeal Swab  Result Value Ref Range Status   SARS Coronavirus 2 by RT PCR NEGATIVE NEGATIVE Final    Comment: (NOTE) SARS-CoV-2 target nucleic acids are NOT DETECTED.  The SARS-CoV-2 RNA is generally detectable in upper respiratoy specimens during the acute phase of infection. The lowest concentration of SARS-CoV-2 viral copies this assay can detect is 131 copies/mL. A negative result does not preclude SARS-Cov-2 infection and should not be used as the sole basis for treatment or other patient management decisions. A negative result may occur with  improper specimen collection/handling, submission of specimen other than nasopharyngeal swab, presence of viral mutation(s) within the areas targeted by this assay, and inadequate number of viral copies (<131 copies/mL). A negative result must be combined with clinical observations, patient history, and epidemiological information.  The expected result is Negative.  Fact Sheet for Patients:  PinkCheek.be  Fact Sheet for Healthcare Providers:  GravelBags.it  This test is no t yet approved or cleared by the [place] FDA and  has been authorized for detection and/or diagnosis of SARS-CoV-2 by FDA under an Emergency Use Authorization (EUA). This EUA will remain  in effect (meaning this test can be used) for the duration of the COVID-19 declaration under Section 564(b)(1) of the Act, 21 U.S.C. section 360bbb-3(b)(1), unless the authorization is terminated or revoked sooner.     Influenza A by PCR NEGATIVE NEGATIVE Final   Influenza B by PCR NEGATIVE NEGATIVE Final    Comment: (NOTE) The Xpert Xpress SARS-CoV-2/FLU/RSV assay is intended as an aid in  the diagnosis of influenza from Nasopharyngeal swab specimens and  should not be used as a sole basis for treatment. Nasal washings and  aspirates are unacceptable for Xpert Xpress SARS-CoV-2/FLU/RSV  testing.  Fact Sheet for Patients: PinkCheek.be  Fact Sheet for Healthcare Providers: GravelBags.it  This test is not yet approved or cleared by the [place] FDA and  has been authorized for detection and/or diagnosis of SARS-CoV-2 by  FDA under an Emergency Use Authorization (EUA). This EUA will remain  in effect (meaning this test can be used) for the duration of the  Covid-19 declaration under Section 564(b)(1) of the Act, 21  U.S.C. section 360bbb-3(b)(1), unless the authorization is  terminated or revoked. Performed at [facility], [place] [address] [place], [place] [address]      Scheduled Meds:  amLODipine  5 mg Oral Daily   apixaban  5 mg Oral BID   atorvastatin  20 mg Oral Daily   azaTHIOprine  200 mg Oral Daily   DULoxetine  60 mg Oral Daily   insulin aspart  0-5 Units Subcutaneous QHS   insulin  aspart  0-9 Units Subcutaneous TID WC   levothyroxine  25 mcg Oral Q0600   LORazepam  1 mg Intramuscular Q6H   metoprolol tartrate  50 mg Oral BID   QUEtiapine  50 mg Oral QHS  Vitamin D (Ergocalciferol)  50,000 Units Oral Q7 days     LOS: 7 days   [place] [facility] Office  [phone] Pager - Text Page per Shea Evans  If 7PM-7AM, please contact night-coverage per Amion 07/21/2020, 11:06 AM

## 2020-07-21 NOTE — Care Management Important Message (Signed)
Important Message  Patient Details IM Letter given to the Patient Name: [name] Sr. MRN: [id number] Date of Birth: [birth year]   Medicare Important Message Given:  Yes     Kerin Salen 07/21/2020, 9:55 AM

## 2020-07-22 ENCOUNTER — Other Ambulatory Visit: Payer: Medicare HMO

## 2020-07-22 DIAGNOSIS — F0391 Unspecified dementia with behavioral disturbance: Secondary | ICD-10-CM | POA: Diagnosis not present

## 2020-07-22 DIAGNOSIS — E538 Deficiency of other specified B group vitamins: Secondary | ICD-10-CM | POA: Diagnosis not present

## 2020-07-22 DIAGNOSIS — R410 Disorientation, unspecified: Secondary | ICD-10-CM | POA: Diagnosis not present

## 2020-07-22 LAB — GLUCOSE, CAPILLARY
Glucose-Capillary: 104 mg/dL — ABNORMAL HIGH (ref 70–99)
Glucose-Capillary: 122 mg/dL — ABNORMAL HIGH (ref 70–99)
Glucose-Capillary: 117 mg/dL — ABNORMAL HIGH (ref 70–99)
Glucose-Capillary: 124 mg/dL — ABNORMAL HIGH (ref 70–99)
Glucose-Capillary: 92 mg/dL (ref 70–99)

## 2020-07-22 LAB — CBC
HCT: 44.3 % (ref 39.0–52.0)
Hemoglobin: 15.3 g/dL (ref 13.0–17.0)
MCH: 33.4 pg (ref 26.0–34.0)
MCHC: 34.5 g/dL (ref 30.0–36.0)
MCV: 96.7 fL (ref 80.0–100.0)
Platelets: 232 10*3/uL (ref 150–400)
RBC: 4.58 MIL/uL (ref 4.22–5.81)
RDW: 15.1 % (ref 11.5–15.5)
WBC: 8.7 10*3/uL (ref 4.0–10.5)
nRBC: 0 % (ref 0.0–0.2)

## 2020-07-22 MED ORDER — DIVALPROEX SODIUM 125 MG PO CSDR
250.0000 mg | DELAYED_RELEASE_CAPSULE | Freq: Two times a day (BID) | ORAL | Status: DC
  Administered 2020-07-22 – 2020-07-25 (×7): 250 mg via ORAL
  Filled 2020-07-22 (×8): qty 2

## 2020-07-22 MED ORDER — LORAZEPAM 2 MG/ML IJ SOLN
1.0000 mg | INTRAMUSCULAR | Status: DC | PRN
  Administered 2020-07-27 – 2020-07-28 (×3): 1 mg via INTRAMUSCULAR
  Filled 2020-07-22 (×2): qty 1

## 2020-07-22 NOTE — Progress Notes (Addendum)
[name] Erven [place] Sr.  OIZ:[id number] DOB: 10-05-1950 DOA: 07/13/2020 PCP: [name], DO    Brief Narrative:  69yo with a history of atrial fibrillation, depression, and dementia who was brought to the ED with increasing agitation over a week as reported by his wife.  His dementia was noted to have progressed significantly over the last 6 months.  Multiple medications have been attempted in the outpatient setting to no avail.  He has been found wandering the neighborhood at night with a flashlight, and 1 day [place] telling anyone where he was.  In the ED CT head was negative for acute findings and there was no evidence of an acute infection.  Significant Events:  11/3 admit via ED  Antimicrobials:  None  DVT prophylaxis: Eliquis  Subjective: Patient remains distracted as before.  No changes noted.   Assessment & Plan:  Acute delirium -progressive chronic dementia Psychiatry evaluated and made recommendations regarding medical therapy.  Patient was started on Tegretol but had to be discontinued after a few days. RPR nonreactive, ethanol undetectable at admission, ammonia essentially normal, TSH normal, thiamine normal. Cortisol level noted to be normal. B12 level was noted to be low and being supplemented as mentioned below. Patient remains delirious.  Patient currently on Seroquel.  Haldol as needed.  Cannot increase the frequency of these medications of the dose due to QTC prolongation.  Also requiring Ativan as needed. Consideration was given to obtaining MRI of the brain however patient would not be able to cooperate with the study and it is unlikely that MRI will change our treatment strategy.  CT scan of the head at the time of admission did not show any acute findings.   This presentation is unlikely to be due to zoster encephalitis.   This is all likely progression of his dementia.   Reconsult psychiatry.  Concern for alcohol withdrawal    No known hx of alcoholism, but w/ recent depression and life stressors it was felt conceivable pt could have been abusing EtOH surreptitiously. A trial on the CIWA protocol did not prove to be helpful.  No withdrawal symptoms noted.  B12 deficiency B12 quite low at 179 on outpt check 06/30/20.  Received vitamin B12 subcutaneously daily for 7 days.  Started on oral supplementation.  Herpes zoster He has lesions across to his left chest and flank area.  Continues to have a few vesicles.  Continue contact isolation.  He was initially placed on Zovirax but was discontinued due to worsening confusion.  Vitamin D deficiency 25 hydroxy vitamin D was low at 10 as assessed on 06/30/2020.  Being replaced.  Depression Continue usual home medications. Acute situational worsening recently due to death of a very close family member which could be precipitating some of his symptoms  Hypothyroidism Continue usual home medications. TSH and FT4 within normal range   DM 2 CBGs are reasonably well controlled.  HbA1c 6.0.    Hypokalemia/hyponatremia Potassium and magnesium levels were normal when last checked.  We will check every few days.  Essential hypertension Blood pressure reasonably controlled  Chronic atrial fibrillation  Continue usual metoprolol and chronic Eliquis. Persistently elevated heart rate likely related to agitation.  If his mentation does not return to baseline then may have to discontinue his anticoagulation as his risk of having an adverse effect, which in this case would be bleeding as a result of a fall, would be much higher than benefits.  Prolonged QTC  Continue to replace electrolytes.  Monitor EKG every so often.    DVT prophylaxis: Noted to be on Eliquis. Code Status: FULL CODE Family Communication: Discussion with patient's wife yesterday.  She will come and visit him today.  She takes care of her elderly mother as well.  She mentioned that she would be unable to care for  him the way he is right now. Disposition: Patient mentation has not improved over the last 8 days.  We will reconsult psychiatry.  Patient may need to go to [facility] psychiatry facility.  Status is: Inpatient  Remains inpatient appropriate because:Inpatient level of care appropriate due to severity of illness   Dispo: The patient is from: Home              Anticipated d/c is to: undetermined              Anticipated d/c date is: 2 days              Patient currently is not medically stable to d/c.  Consultants:  Psychiatry  Objective: Blood pressure (!) 125/93, pulse 87, temperature 97.8 F (36.6 C), temperature source Axillary, resp. rate 15, height 5' 7"  (1.702 m), weight 115.2 kg, SpO2 (!) 89 %.  Intake/Output Summary (Last 24 hours) at 07/22/2020 1113 Last data filed at 07/22/2020 0637 Gross per 24 hour  Intake 280 ml  Output 750 ml  Net -470 ml   Filed Weights   07/20/20 0922  Weight: 115.2 kg    Examination:  General appearance: Distracted but awake alert.  Follows certain commands. Resp: Clear to auscultation bilaterally.  Normal effort Cardio: S1-S2 is normal regular.  No S3-S4.  No rubs murmurs or bruit GI: Abdomen is soft.  Nontender nondistended.  Bowel sounds are present normal.  No masses organomegaly Extremities: No edema.  Noted to be moving all his extremities Skin: The shingles lesions in the left chest appear to be improving.  Crusting is noted.  1 vesicle noted. Neurologic: Remains disoriented.  No focal deficits.      CBC: Recent Labs  Lab 07/22/20 0400  WBC 8.7  HGB 15.3  HCT 44.3  MCV 96.7  PLT 539   Basic Metabolic Panel: Recent Labs  Lab 07/17/20 0406 07/17/20 0406 07/19/20 0407 07/20/20 0421 07/21/20 0416  NA 130*   < > 131* 131* 132*  K 3.6   < > 3.1* 3.4* 4.2  CL 92*   < > 94* 95* 97*  CO2 26   < > 27 26 26   GLUCOSE 126*   < > 137* 110* 114*  BUN 10   < > 10 11 11   CREATININE 0.96   < > 0.96 0.88 0.86  CALCIUM 9.0   < > 9.2  8.9 8.9  MG 2.0  --  2.2 2.1  --   PHOS  --   --  3.9  --   --    < > = values in this interval not displayed.   GFR: Estimated Creatinine Clearance: 95.5 mL/min (by C-G formula based on SCr of 0.86 mg/dL).  Liver Function Tests: Recent Labs  Lab 07/17/20 0406 07/20/20 0421  AST 71* 68*  ALT 47* 53*  ALKPHOS 43 49  BILITOT 1.6* 1.4*  PROT 6.8 6.8  ALBUMIN 3.8 3.7    HbA1C: Hemoglobin A1C  Date/Time Value Ref Range Status  08/09/2017 12:00 AM 6.0  Final   Hgb A1c MFr Bld  Date/Time Value Ref Range Status  06/30/2020 11:07 AM  6.9 (H) 4.6 - 6.5 % Final    Comment:    Glycemic Control Guidelines for People with Diabetes:Non Diabetic:  <6%Goal of Therapy: <7%Additional Action Suggested:  >8%   02/17/2019 07:07 PM 5.8 (H) 4.8 - 5.6 % Final    Comment:    (NOTE) Pre diabetes:          5.7%-6.4% Diabetes:              >6.4% Glycemic control for   <7.0% adults with diabetes     CBG: Recent Labs  Lab 07/21/20 1123 07/21/20 1708 07/21/20 1742 07/21/20 2111 07/22/20 0736  GLUCAP 131* 104* 123* 91 122*    Recent Results (from the past 240 hour(s))  Respiratory Panel by RT PCR (Flu A&B, Covid) - Nasopharyngeal Swab     Status: None   Collection Time: 07/13/20  7:15 PM   Specimen: Nasopharyngeal Swab  Result Value Ref Range Status   SARS Coronavirus 2 by RT PCR NEGATIVE NEGATIVE Final    Comment: (NOTE) SARS-CoV-2 target nucleic acids are NOT DETECTED.  The SARS-CoV-2 RNA is generally detectable in upper respiratoy specimens during the acute phase of infection. The lowest concentration of SARS-CoV-2 viral copies this assay can detect is 131 copies/mL. A negative result does not preclude SARS-Cov-2 infection and should not be used as the sole basis for treatment or other patient management decisions. A negative result may occur with  improper specimen collection/handling, submission of specimen other than nasopharyngeal swab, presence of viral mutation(s) within  the areas targeted by this assay, and inadequate number of viral copies (<131 copies/mL). A negative result must be combined with clinical observations, patient history, and epidemiological information. The expected result is Negative.  Fact Sheet for Patients:  PinkCheek.be  Fact Sheet for Healthcare Providers:  GravelBags.it  This test is no t yet approved or cleared by the [place] FDA and  has been authorized for detection and/or diagnosis of SARS-CoV-2 by FDA under an Emergency Use Authorization (EUA). This EUA will remain  in effect (meaning this test can be used) for the duration of the COVID-19 declaration under Section 564(b)(1) of the Act, 21 U.S.C. section 360bbb-3(b)(1), unless the authorization is terminated or revoked sooner.     Influenza A by PCR NEGATIVE NEGATIVE Final   Influenza B by PCR NEGATIVE NEGATIVE Final    Comment: (NOTE) The Xpert Xpress SARS-CoV-2/FLU/RSV assay is intended as an aid in  the diagnosis of influenza from Nasopharyngeal swab specimens and  should not be used as a sole basis for treatment. Nasal washings and  aspirates are unacceptable for Xpert Xpress SARS-CoV-2/FLU/RSV  testing.  Fact Sheet for Patients: PinkCheek.be  Fact Sheet for Healthcare Providers: GravelBags.it  This test is not yet approved or cleared by the [place] FDA and  has been authorized for detection and/or diagnosis of SARS-CoV-2 by  FDA under an Emergency Use Authorization (EUA). This EUA will remain  in effect (meaning this test can be used) for the duration of the  Covid-19 declaration under Section 564(b)(1) of the Act, 21  U.S.C. section 360bbb-3(b)(1), unless the authorization is  terminated or revoked. Performed at [facility], [place] [address] [place], [place] [address]      Scheduled Meds: . amLODipine  5 mg  Oral Daily  . apixaban  5 mg Oral BID  . atorvastatin  20 mg Oral Daily  . azaTHIOprine  200 mg Oral Daily  . DULoxetine  60 mg Oral Daily  .  insulin aspart  0-5 Units Subcutaneous QHS  . insulin aspart  0-9 Units Subcutaneous TID WC  . levothyroxine  25 mcg Oral Q0600  . LORazepam  1 mg Intramuscular Q6H  . metoprolol tartrate  50 mg Oral BID  . QUEtiapine  50 mg Oral QHS  . vitamin B-12  1,000 mcg Oral Daily  . Vitamin D (Ergocalciferol)  50,000 Units Oral Q7 days     LOS: 8 days   [place] [facility] Office  [phone] Pager - Text Page per Shea Evans  If 7PM-7AM, please contact night-coverage per Amion 07/22/2020, 11:13 AM

## 2020-07-22 NOTE — Consult Note (Signed)
Patient off the unit to radiology. Nursing suspects he went to MRI. Psych consult was placed as his mentation is not improving, can we try new meds? Does he need geri-pscyh?   Patient was previously assessed by this nurse practitioner 1 week ago. He is a 69 year old male with history of dementia and afib, who presented to the ED with worsening dementia with behavioral disturbances per his wife. At current he continues to require prn medications and restraints at times. Patient with QTc prolongation of 509, on previous evaluation we had recommended to avoid antipsychotics due to his original QTc of 519. Per chart review he was initiated on Seroqeul 53m po daily and Haldol 249mIM q6 hr prn (He has received 4 doses over the past 3 days). His behaviors continue to be consistent with progression of dementia, and would benefit from memory care unit.  -Continue to recommend refraining from antipsychotics at this time, especially Seroquel as it carries a higher risk of QTc prolongation. If we are considering discharge home, will recommend starting mood stabilizer below. -Again it is with recommendation that we start a mood stabilizer such as Tegretol 20026mo BID or Depakote sprinkle or Depakote capsule 250m20m BID  for aggression, agitation and other behavioral disturbances 2/t dementia.  -Continue working closely with social work for referral to SNF, Memory care unit or HH, as they are well equipped to help elderly persons with memory loss, maintain cognitive skills and help with quality of life. Chart review indicates his wife plans to take him home and retire at the end of this month.  -Will d/c consult at this time. Please re-consult psychiatry if patient symptoms continue to worsen.

## 2020-07-23 DIAGNOSIS — E538 Deficiency of other specified B group vitamins: Secondary | ICD-10-CM | POA: Diagnosis not present

## 2020-07-23 DIAGNOSIS — F0391 Unspecified dementia with behavioral disturbance: Secondary | ICD-10-CM | POA: Diagnosis not present

## 2020-07-23 DIAGNOSIS — R410 Disorientation, unspecified: Secondary | ICD-10-CM | POA: Diagnosis not present

## 2020-07-23 LAB — GLUCOSE, CAPILLARY
Glucose-Capillary: 118 mg/dL — ABNORMAL HIGH (ref 70–99)
Glucose-Capillary: 100 mg/dL — ABNORMAL HIGH (ref 70–99)
Glucose-Capillary: 104 mg/dL — ABNORMAL HIGH (ref 70–99)
Glucose-Capillary: 103 mg/dL — ABNORMAL HIGH (ref 70–99)

## 2020-07-23 NOTE — Progress Notes (Addendum)
[name] [place] Sr.  KWI:[id number] DOB: 11/23/1950 DOA: 07/13/2020 PCP: [name], DO    Brief Narrative:  69yo with a history of atrial fibrillation, depression, and dementia who was brought to the ED with increasing agitation over a week as reported by his wife.  His dementia was noted to have progressed significantly over the last 6 months.  Multiple medications have been attempted in the outpatient setting to no avail.  He has been found wandering the neighborhood at night with a flashlight, and 1 [place] anyone where he was.  In the ED CT head was negative for acute findings and there was no evidence of an acute infection.  Significant Events:  11/3 admit via ED  Antimicrobials:  None  DVT prophylaxis: Eliquis  Subjective: Patient remains distracted as before.  No changes noted.   Assessment & Plan:  Acute delirium -progressive chronic dementia Patient's presentation thought to be due to progression of his dementia precipitated also by recent death of family member. Psychiatry evaluated and made recommendations regarding medical therapy.  Patient was started on Tegretol but had to be discontinued after a few days. RPR nonreactive, ethanol undetectable at admission, ammonia essentially normal, TSH normal, thiamine normal. Cortisol level noted to be normal. B12 level was noted to be low and being supplemented as mentioned below. Consideration was given to obtaining MRI of the brain however patient would not be able to cooperate with the study and it is unlikely that MRI will change our treatment strategy.  CT scan of the head at the time of admission did not show any acute findings. This presentation is unlikely to be due to zoster encephalitis.  Patient remains on Seroquel.  Haldol as needed's no other medication has really controlled his agitation.  QTC was checked this morning and noted to be 508.  Also getting Ativan as needed.   Psychiatry was reconsulted.  They are recommending again Depakote or Tegretol.  We will start the patient on Depakote.   Landscape architect.  Continue restraints as patient tends to try and get out of the bed which may result in a fall and injury.     Concern for alcohol withdrawal  No known hx of alcoholism, but w/ recent depression and life stressors it was felt conceivable pt could have been abusing EtOH surreptitiously. A trial on the CIWA protocol did not prove to be helpful.  No withdrawal symptoms noted.  B12 deficiency B12 quite low at 179 on outpt check 06/30/20.  Received vitamin B12 subcutaneously daily for 7 days.  Started on oral supplementation.  Herpes zoster He has lesions across to his left chest and flank area.  Continue contact isolation.  He was initially placed on Zovirax but was discontinued due to worsening confusion.   Lesions appear to be improving.  Vitamin D deficiency 25 hydroxy vitamin D was low at 35 as assessed on 06/30/2020.  Being replaced.  Depression Continue usual home medications. Acute situational worsening recently due to death of a very close family member which could be precipitating some of his symptoms  Hypothyroidism Continue usual home medications. TSH and FT4 within normal range   DM 2 CBGs are reasonably well controlled.  HbA1c 6.0.    Hypokalemia/hyponatremia Potassium and magnesium levels were normal when last checked.  We will check every few days.  Essential hypertension Blood pressure reasonably controlled  Chronic atrial fibrillation  Continue usual metoprolol and chronic Eliquis. Persistently elevated  heart rate likely related to agitation.  If his mentation does not return to baseline then may have to discontinue his anticoagulation as his risk of having an adverse effect, which in this case would be bleeding as a result of a fall, would be much higher than benefits.  Prolonged QTC Monitor electrolytes.  QTC noted to be 508 this  morning.  History of ulcerative colitis Noted to be on Imuran for same.  DVT prophylaxis: Noted to be on Eliquis. Code Status: FULL CODE Family Communication: Patient's wife was updated 2 days ago.  She was supposed to come and visit him yesterday.   Disposition: No appreciable improvement in patient's mentation noted.  Patient started on Depakote yesterday.  We will see if there is any improvement with this.  Psychiatry reconsulted.  They do not recommend [place] psychiatry at this time.  Patient will definitely need to be placed as it does not appear that he is going to be safe to go home.  His wife unable to care for him at home.  Status is: Inpatient  Remains inpatient appropriate because:Altered mental status and Unsafe d/c plan   Dispo: The patient is from: Home              Anticipated d/c is to: To be determined              Anticipated d/c date is: 3 days              Patient currently is not medically stable to d/c.     Consultants:  Psychiatry  Objective: Blood pressure 122/65, pulse (!) 101, temperature 98.1 F (36.7 C), resp. rate 19, height 5' 7"  (1.702 m), weight 115.2 kg, SpO2 92 %.  Intake/Output Summary (Last 24 hours) at 07/23/2020 1057 Last data filed at 07/23/2020 0849 Gross per 24 hour  Intake 638 ml  Output 100 ml  Net 538 ml   Filed Weights   07/20/20 0922  Weight: 115.2 kg    Examination:  General appearance: Awake.  Distracted. Resp: Clear to auscultation bilaterally.  Normal effort Cardio: S1-S2 is normal regular.  No S3-S4.  No rubs murmurs or bruit GI: Abdomen is soft.  Nontender nondistended.  Bowel sounds are present normal.  No masses organomegaly Extremities: No edema.   Skin: The shingles lesions and rash in the left chest area appear to be improving.  crusting noted. Neurologic: Remains disoriented. no focal neurological deficits.        CBC: Recent Labs  Lab 07/22/20 0400  WBC 8.7  HGB 15.3  HCT 44.3  MCV 96.7  PLT 232     Basic Metabolic Panel: Recent Labs  Lab 07/17/20 0406 07/17/20 0406 07/19/20 0407 07/20/20 0421 07/21/20 0416  NA 130*   < > 131* 131* 132*  K 3.6   < > 3.1* 3.4* 4.2  CL 92*   < > 94* 95* 97*  CO2 26   < > 27 26 26   GLUCOSE 126*   < > 137* 110* 114*  BUN 10   < > 10 11 11   CREATININE 0.96   < > 0.96 0.88 0.86  CALCIUM 9.0   < > 9.2 8.9 8.9  MG 2.0  --  2.2 2.1  --   PHOS  --   --  3.9  --   --    < > = values in this interval not displayed.   GFR: Estimated Creatinine Clearance: 95.5 mL/min (by C-G formula based on SCr of  0.86 mg/dL).  Liver Function Tests: Recent Labs  Lab 07/17/20 0406 07/20/20 0421  AST 71* 68*  ALT 47* 53*  ALKPHOS 43 49  BILITOT 1.6* 1.4*  PROT 6.8 6.8  ALBUMIN 3.8 3.7    HbA1C: Hemoglobin A1C  Date/Time Value Ref Range Status  08/09/2017 12:00 AM 6.0  Final   Hgb A1c MFr Bld  Date/Time Value Ref Range Status  06/30/2020 11:07 AM 6.9 (H) 4.6 - 6.5 % Final    Comment:    Glycemic Control Guidelines for People with Diabetes:Non Diabetic:  <6%Goal of Therapy: <7%Additional Action Suggested:  >8%   02/17/2019 07:07 PM 5.8 (H) 4.8 - 5.6 % Final    Comment:    (NOTE) Pre diabetes:          5.7%-6.4% Diabetes:              >6.4% Glycemic control for   <7.0% adults with diabetes     CBG: Recent Labs  Lab 07/22/20 0736 07/22/20 1157 07/22/20 1641 07/22/20 2100 07/23/20 0747  GLUCAP 122* 117* 124* 92 118*    Recent Results (from the past 240 hour(s))  Respiratory Panel by RT PCR (Flu A&B, Covid) - Nasopharyngeal Swab     Status: None   Collection Time: 07/13/20  7:15 PM   Specimen: Nasopharyngeal Swab  Result Value Ref Range Status   SARS Coronavirus 2 by RT PCR NEGATIVE NEGATIVE Final    Comment: (NOTE) SARS-CoV-2 target nucleic acids are NOT DETECTED.  The SARS-CoV-2 RNA is generally detectable in upper respiratoy specimens during the acute phase of infection. The lowest concentration of SARS-CoV-2 viral copies this  assay can detect is 131 copies/mL. A negative result does not preclude SARS-Cov-2 infection and should not be used as the sole basis for treatment or other patient management decisions. A negative result may occur with  improper specimen collection/handling, submission of specimen other than nasopharyngeal swab, presence of viral mutation(s) within the areas targeted by this assay, and inadequate number of viral copies (<131 copies/mL). A negative result must be combined with clinical observations, patient history, and epidemiological information. The expected result is Negative.  Fact Sheet for Patients:  PinkCheek.be  Fact Sheet for Healthcare Providers:  GravelBags.it  This test is no t yet approved or cleared by the [place] FDA and  has been authorized for detection and/or diagnosis of SARS-CoV-2 by FDA under an Emergency Use Authorization (EUA). This EUA will remain  in effect (meaning this test can be used) for the duration of the COVID-19 declaration under Section 564(b)(1) of the Act, 21 U.S.C. section 360bbb-3(b)(1), unless the authorization is terminated or revoked sooner.     Influenza A by PCR NEGATIVE NEGATIVE Final   Influenza B by PCR NEGATIVE NEGATIVE Final    Comment: (NOTE) The Xpert Xpress SARS-CoV-2/FLU/RSV assay is intended as an aid in  the diagnosis of influenza from Nasopharyngeal swab specimens and  should not be used as a sole basis for treatment. Nasal washings and  aspirates are unacceptable for Xpert Xpress SARS-CoV-2/FLU/RSV  testing.  Fact Sheet for Patients: PinkCheek.be  Fact Sheet for Healthcare Providers: GravelBags.it  This test is not yet approved or cleared by the [place] FDA and  has been authorized for detection and/or diagnosis of SARS-CoV-2 by  FDA under an Emergency Use Authorization (EUA). This EUA will  remain  in effect (meaning this test can be used) for the duration of the  Covid-19 declaration under Section 564(b)(1) of the Act, 21  U.S.C. section 360bbb-3(b)(1), unless the authorization is  terminated or revoked. Performed at [facility], [place] [address], [place], [place] [address]      Scheduled Meds: . amLODipine  5 mg Oral Daily  . apixaban  5 mg Oral BID  . atorvastatin  20 mg Oral Daily  . azaTHIOprine  200 mg Oral Daily  . divalproex  250 mg Oral Q12H  . DULoxetine  60 mg Oral Daily  . insulin aspart  0-5 Units Subcutaneous QHS  . insulin aspart  0-9 Units Subcutaneous TID WC  . levothyroxine  25 mcg Oral Q0600  . LORazepam  1 mg Intramuscular Q6H  . metoprolol tartrate  50 mg Oral BID  . QUEtiapine  50 mg Oral QHS  . vitamin B-12  1,000 mcg Oral Daily  . Vitamin D (Ergocalciferol)  50,000 Units Oral Q7 days     LOS: 9 days   [place] [facility] Office  [phone] Pager - Text Page per Shea Evans  If 7PM-7AM, please contact night-coverage per Amion 07/23/2020, 10:57 AM

## 2020-07-24 DIAGNOSIS — E538 Deficiency of other specified B group vitamins: Secondary | ICD-10-CM | POA: Diagnosis not present

## 2020-07-24 DIAGNOSIS — R410 Disorientation, unspecified: Secondary | ICD-10-CM | POA: Diagnosis not present

## 2020-07-24 DIAGNOSIS — F0391 Unspecified dementia with behavioral disturbance: Secondary | ICD-10-CM | POA: Diagnosis not present

## 2020-07-24 LAB — GLUCOSE, CAPILLARY
Glucose-Capillary: 115 mg/dL — ABNORMAL HIGH (ref 70–99)
Glucose-Capillary: 106 mg/dL — ABNORMAL HIGH (ref 70–99)
Glucose-Capillary: 103 mg/dL — ABNORMAL HIGH (ref 70–99)
Glucose-Capillary: 111 mg/dL — ABNORMAL HIGH (ref 70–99)

## 2020-07-24 LAB — BASIC METABOLIC PANEL
Anion gap: 10 (ref 5–15)
BUN: 13 mg/dL (ref 8–23)
CO2: 26 mmol/L (ref 22–32)
Calcium: 9.3 mg/dL (ref 8.9–10.3)
Chloride: 98 mmol/L (ref 98–111)
Creatinine, Ser: 1.04 mg/dL (ref 0.61–1.24)
GFR, Estimated: >60 mL/min (ref >60)
Glucose, Bld: 111 mg/dL — ABNORMAL HIGH (ref 70–99)
Potassium: 3.9 mmol/L (ref 3.5–5.1)
Sodium: 134 mmol/L — ABNORMAL LOW (ref 135–145)

## 2020-07-24 LAB — MAGNESIUM: Magnesium: 2 mg/dL (ref 1.7–2.4)

## 2020-07-24 NOTE — Progress Notes (Signed)
[name] Sr.  OIB:[id number] DOB: Aug 16, 1951 DOA: 07/13/2020 PCP: [name], DO    Brief Narrative:  69yo with a history of atrial fibrillation, depression, and dementia who was brought to the ED with increasing agitation over a week as reported by his wife.  His dementia was noted to have progressed significantly over the last 6 months.  Multiple medications have been attempted in the outpatient setting to no avail.  He has been found wandering the neighborhood at night with a flashlight, and 1 [place] anyone where he was.  In the ED CT head was negative for acute findings and there was no evidence of an acute infection.  Significant Events:  11/3 admit via ED  Antimicrobials:  None  DVT prophylaxis: Eliquis  Subjective: Patient noted to be somnolent this morning.  Easily arousable.  Confused as before.   Assessment & Plan:  Acute delirium -progressive chronic dementia Patient's presentation thought to be due to progression of his dementia precipitated also by recent death of family member. Psychiatry evaluated and made recommendations regarding medical therapy.  Patient was started on Tegretol but had to be discontinued after a few days. RPR nonreactive, ethanol undetectable at admission, ammonia essentially normal, TSH normal, thiamine normal. Cortisol level noted to be normal. B12 level was noted to be low and being supplemented as mentioned below. Consideration was given to obtaining MRI of the brain however patient would not be able to cooperate with the study and it is unlikely that MRI will change our treatment strategy.  CT scan of the head at the time of admission did not show any acute findings. This presentation is unlikely to be due to zoster encephalitis.  Patient remains on Seroquel.  Haldol as needed as no other medication has really controlled his agitation.  QTC was checked 11/13 and noted to be 508.  Not make any  changes to the antipsychotics.  Also getting Ativan as needed.   Psychiatry was reconsulted.  They are recommending again Depakote or Tegretol.  She was started on Depakote. Landscape architect.  Continue restraints as patient tends to try and get out of the bed which may result in a fall and injury.     Concern for alcohol withdrawal  No known hx of alcoholism, but w/ recent depression and life stressors it was felt conceivable pt could have been abusing EtOH surreptitiously. A trial on the CIWA protocol did not prove to be helpful.  No withdrawal symptoms noted.  B12 deficiency B12 quite low at 179 on outpt check 06/30/20.  Received vitamin B12 subcutaneously daily for 7 days.  Started on oral supplementation.  Herpes zoster He has lesions across to his left chest and flank area.  Continue contact isolation.  He was initially placed on Zovirax but was discontinued due to worsening confusion.   Lesions are improving.  Vitamin D deficiency 25 hydroxy vitamin D was low at 10 as assessed on 06/30/2020.  Being replaced.  Depression Continue usual home medications. Acute situational worsening recently due to death of a very close family member which could be precipitating some of his symptoms  Hypothyroidism Continue usual home medications. TSH and FT4 within normal range   DM 2 CBGs are reasonably well controlled.  HbA1c 6.0.    Hypokalemia/hyponatremia Stable this morning.    Essential hypertension Blood pressure reasonably controlled  Chronic atrial fibrillation  Continue usual metoprolol and chronic Eliquis. Persistently elevated heart rate likely  related to agitation.  If his mentation does not return to baseline then may have to discontinue his anticoagulation as his risk of having an adverse effect, which in this case would be bleeding as a result of a fall, would be much higher than benefit.  Prolonged QTC Monitor electrolytes.  QTC noted to be 508 this morning.  History of  ulcerative colitis Noted to be on Imuran for same.  DVT prophylaxis: Noted to be on Eliquis. Code Status: FULL CODE Family Communication: Patient's wife will be updated today. Disposition: Continues to show no improvement in his mentation.  Depakote was started 2 days ago.  Patient will not be able to go home in his current state.  Will likely need placement.  Social worker is following.    Status is: Inpatient  Remains inpatient appropriate because:Altered mental status and Unsafe d/c plan   Dispo: The patient is from: Home              Anticipated d/c is to: To be determined              Anticipated d/c date is: 3 days              Patient currently is not medically stable to d/c.     Consultants:  Psychiatry  Objective: Blood pressure 128/72, pulse 62, temperature 98.4 F (36.9 C), resp. rate 20, height 5' 7"  (1.702 m), weight 115.2 kg, SpO2 97 %.  Intake/Output Summary (Last 24 hours) at 07/24/2020 1006 Last data filed at 07/24/2020 0826 Gross per 24 hour  Intake 230 ml  Output 200 ml  Net 30 ml   Filed Weights   07/20/20 0922  Weight: 115.2 kg    Examination:  General appearance: Awake alert.  In no distress.  Remains distracted Resp: Clear to auscultation bilaterally.  Normal effort Cardio: S1-S2 is normal regular.  No S3-S4.  No rubs murmurs or bruit GI: Abdomen is soft.  Nontender nondistended.  Bowel sounds are present normal.  No masses organomegaly Extremities: Noted to move all his extremities Skin: The shingles lesions and rash in his left chest area improving. Neurologic: Remains disoriented.  No focal neurological deficits.       CBC: Recent Labs  Lab 07/22/20 0400  WBC 8.7  HGB 15.3  HCT 44.3  MCV 96.7  PLT 034   Basic Metabolic Panel: Recent Labs  Lab 07/19/20 0407 07/19/20 0407 07/20/20 0421 07/21/20 0416 07/24/20 0341  NA 131*   < > 131* 132* 134*  K 3.1*   < > 3.4* 4.2 3.9  CL 94*   < > 95* 97* 98  CO2 27   < > 26 26 26     GLUCOSE 137*   < > 110* 114* 111*  BUN 10   < > 11 11 13   CREATININE 0.96   < > 0.88 0.86 1.04  CALCIUM 9.2   < > 8.9 8.9 9.3  MG 2.2  --  2.1  --  2.0  PHOS 3.9  --   --   --   --    < > = values in this interval not displayed.   GFR: Estimated Creatinine Clearance: 79 mL/min (by C-G formula based on SCr of 1.04 mg/dL).  Liver Function Tests: Recent Labs  Lab 07/20/20 0421  AST 68*  ALT 53*  ALKPHOS 49  BILITOT 1.4*  PROT 6.8  ALBUMIN 3.7    HbA1C: Hemoglobin A1C  Date/Time Value Ref Range Status  08/09/2017 12:00  AM 6.0  Final   Hgb A1c MFr Bld  Date/Time Value Ref Range Status  06/30/2020 11:07 AM 6.9 (H) 4.6 - 6.5 % Final    Comment:    Glycemic Control Guidelines for People with Diabetes:Non Diabetic:  <6%Goal of Therapy: <7%Additional Action Suggested:  >8%   02/17/2019 07:07 PM 5.8 (H) 4.8 - 5.6 % Final    Comment:    (NOTE) Pre diabetes:          5.7%-6.4% Diabetes:              >6.4% Glycemic control for   <7.0% adults with diabetes     CBG: Recent Labs  Lab 07/23/20 0747 07/23/20 1153 07/23/20 1652 07/23/20 2116 07/24/20 0726  GLUCAP 118* 100* 104* 103* 115*    No results found for this or any previous visit (from the past 240 hour(s)).   Scheduled Meds: . amLODipine  5 mg Oral Daily  . apixaban  5 mg Oral BID  . atorvastatin  20 mg Oral Daily  . azaTHIOprine  200 mg Oral Daily  . divalproex  250 mg Oral Q12H  . DULoxetine  60 mg Oral Daily  . insulin aspart  0-9 Units Subcutaneous TID WC  . levothyroxine  25 mcg Oral Q0600  . LORazepam  1 mg Intramuscular Q6H  . metoprolol tartrate  50 mg Oral BID  . QUEtiapine  50 mg Oral QHS  . vitamin B-12  1,000 mcg Oral Daily  . Vitamin D (Ergocalciferol)  50,000 Units Oral Q7 days     LOS: 10 days   [facility] Office  [phone] Pager - Text Page per Shea Evans  If 7PM-7AM, please contact night-coverage per Amion 07/24/2020, 10:06 AM

## 2020-07-25 DIAGNOSIS — E538 Deficiency of other specified B group vitamins: Secondary | ICD-10-CM | POA: Diagnosis not present

## 2020-07-25 DIAGNOSIS — F0391 Unspecified dementia with behavioral disturbance: Secondary | ICD-10-CM | POA: Diagnosis not present

## 2020-07-25 DIAGNOSIS — R410 Disorientation, unspecified: Secondary | ICD-10-CM | POA: Diagnosis not present

## 2020-07-25 LAB — GLUCOSE, CAPILLARY
Glucose-Capillary: 113 mg/dL — ABNORMAL HIGH (ref 70–99)
Glucose-Capillary: 123 mg/dL — ABNORMAL HIGH (ref 70–99)
Glucose-Capillary: 96 mg/dL (ref 70–99)
Glucose-Capillary: 111 mg/dL — ABNORMAL HIGH (ref 70–99)

## 2020-07-25 NOTE — Progress Notes (Addendum)
[name] Keidel Brady Sr.  KXF:[id number] DOB: 1951-06-07 DOA: 07/13/2020 PCP: [name], DO    Brief Narrative:  69yo with a history of atrial fibrillation, depression, and dementia who was brought to the ED with increasing agitation over a week as reported by his wife.  His dementia was noted to have progressed significantly over the last 6 months.  Multiple medications have been attempted in the outpatient setting to no avail.  He has been found wandering the neighborhood at night with a flashlight, and 1 [place] anyone where he was.  In the ED CT head was negative for acute findings and there was no evidence of an acute infection.  Significant Events:  11/3 admit via ED  Antimicrobials:  None  DVT prophylaxis: Eliquis  Subjective: Patient noted to be somnolent this morning.  Easily arousable.  Confused as before.   Assessment & Plan:  Acute delirium -progressive chronic dementia Patient's presentation thought to be due to progression of his dementia precipitated also by recent death of family member. Psychiatry evaluated and made recommendations regarding medical therapy.  Patient was started on Tegretol but had to be discontinued after a few days. RPR nonreactive, ethanol undetectable at admission, ammonia essentially normal, TSH normal, thiamine normal. Cortisol level noted to be normal. B12 level was noted to be low and being supplemented as mentioned below. Consideration was given to obtaining MRI of the brain however patient would not be able to cooperate with the study and it is unlikely that MRI will change our treatment strategy.  CT scan of the head at the time of admission did not show any acute findings. This presentation is unlikely to be due to zoster encephalitis.  Patient remains on Seroquel.  Haldol as needed as no other medication has really controlled his agitation.  QTC was checked 11/13 and noted to be 508.  Not make any  changes to the antipsychotics.  Also getting Ativan as needed.   Psychiatry was reconsulted.  They are recommending again Depakote or Tegretol.  He was started on Depakote.  Remains confused and with.  Some agitation.  There is no change in 24 hours we will increase the dose of Depakote tomorrow. Landscape architect.  Continue restraints as the patient tends to try and get out of the bed which may result in a fall and injury.   Concern for alcohol withdrawal  No known hx of alcoholism, but w/ recent depression and life stressors it was felt conceivable pt could have been abusing EtOH surreptitiously. A trial on the CIWA protocol did not prove to be helpful.  No withdrawal symptoms noted.  B12 deficiency B12 quite low at 179 on outpt check 06/30/20.  Received vitamin B12 subcutaneously daily for 7 days.  Started on oral supplementation.  Herpes zoster He has lesions across to his left chest and flank area.  Continue contact isolation.  He was initially placed on Zovirax but was discontinued due to worsening confusion.   Lesions have been improving.  Vitamin D deficiency 25 hydroxy vitamin D was low at 70 as assessed on 06/30/2020.  Being replaced.  Depression Continue usual home medications. Acute situational worsening recently due to death of a very close family member which could be precipitating some of his symptoms  Hypothyroidism Continue usual home medications. TSH and FT4 within normal range   DM 2 CBGs are reasonably well controlled.  HbA1c 6.0.  Hypokalemia/hyponatremia Noted to be stable when last  checked.  Checking every few days.  Essential hypertension Blood pressure reasonably controlled  Chronic atrial fibrillation  Continue usual metoprolol and chronic Eliquis. Persistently elevated heart rate likely related to agitation.  If his mentation does not return to baseline then may have to discontinue his anticoagulation as his risk of having an adverse effect, which in this  case would be bleeding as a result of a fall, would be much higher than benefit.  We will discuss this with his wife.  Prolonged QTC Monitor electrolytes.  QTC noted to be 508 this morning.  History of ulcerative colitis Noted to be on Imuran for same.  DVT prophylaxis: Noted to be on Eliquis. Code Status: Discussed CODE STATUS with wife.  Changed to DNR based on patient's previously known wishes. Family Communication: Wife updated today. Disposition: Patient continues to remain agitated and confused.  His wife will be unable to take care of him in his current state.  Continue Depakote.  Main increases dose tomorrow if there is no improvement.  Will most likely need placement which will also be challenging.  Social worker is following.    Status is: Inpatient  Remains inpatient appropriate because:Altered mental status and Unsafe d/c plan   Dispo: The patient is from: Home              Anticipated d/c is to: To be determined              Anticipated d/c date is: 3 days              Patient currently is not medically stable to d/c.     Consultants:  Psychiatry  Objective: Blood pressure 135/90, pulse 72, temperature 98.2 F (36.8 C), resp. rate 20, height 5' 7"  (1.702 m), weight 115.2 kg, SpO2 97 %.  Intake/Output Summary (Last 24 hours) at 07/25/2020 1009 Last data filed at 07/25/2020 0959 Gross per 24 hour  Intake 900 ml  Output --  Net 900 ml   Filed Weights   07/20/20 0922  Weight: 115.2 kg    Examination:  General appearance: Awake alert.  In no distress.  Distracted and confused. Resp: Clear to auscultation bilaterally.  Normal effort Cardio: S1-S2 is normal regular.  No S3-S4.  No rubs murmurs or bruit GI: Abdomen is soft.  Nontender nondistended.  Bowel sounds are present normal.  No masses organomegaly Skin: Rash over the left chest area is improving. Extremities: Noted to be moving all his extremities. Neurologic: Remains disoriented.  No focal  neurological deficits.        CBC: Recent Labs  Lab 07/22/20 0400  WBC 8.7  HGB 15.3  HCT 44.3  MCV 96.7  PLT 035   Basic Metabolic Panel: Recent Labs  Lab 07/19/20 0407 07/19/20 0407 07/20/20 0421 07/21/20 0416 07/24/20 0341  NA 131*   < > 131* 132* 134*  K 3.1*   < > 3.4* 4.2 3.9  CL 94*   < > 95* 97* 98  CO2 27   < > 26 26 26   GLUCOSE 137*   < > 110* 114* 111*  BUN 10   < > 11 11 13   CREATININE 0.96   < > 0.88 0.86 1.04  CALCIUM 9.2   < > 8.9 8.9 9.3  MG 2.2  --  2.1  --  2.0  PHOS 3.9  --   --   --   --    < > = values in this interval not displayed.  GFR: Estimated Creatinine Clearance: 79 mL/min (by C-G formula based on SCr of 1.04 mg/dL).  Liver Function Tests: Recent Labs  Lab 07/20/20 0421  AST 68*  ALT 53*  ALKPHOS 49  BILITOT 1.4*  PROT 6.8  ALBUMIN 3.7    HbA1C: Hemoglobin A1C  Date/Time Value Ref Range Status  08/09/2017 12:00 AM 6.0  Final   Hgb A1c MFr Bld  Date/Time Value Ref Range Status  06/30/2020 11:07 AM 6.9 (H) 4.6 - 6.5 % Final    Comment:    Glycemic Control Guidelines for People with Diabetes:Non Diabetic:  <6%Goal of Therapy: <7%Additional Action Suggested:  >8%   02/17/2019 07:07 PM 5.8 (H) 4.8 - 5.6 % Final    Comment:    (NOTE) Pre diabetes:          5.7%-6.4% Diabetes:              >6.4% Glycemic control for   <7.0% adults with diabetes     CBG: Recent Labs  Lab 07/24/20 0726 07/24/20 1130 07/24/20 1707 07/24/20 2135 07/25/20 0741  GLUCAP 115* 106* 103* 111* 113*    No results found for this or any previous visit (from the past 240 hour(s)).   Scheduled Meds: . amLODipine  5 mg Oral Daily  . apixaban  5 mg Oral BID  . atorvastatin  20 mg Oral Daily  . azaTHIOprine  200 mg Oral Daily  . divalproex  250 mg Oral Q12H  . DULoxetine  60 mg Oral Daily  . insulin aspart  0-9 Units Subcutaneous TID WC  . levothyroxine  25 mcg Oral Q0600  . LORazepam  1 mg Intramuscular Q6H  . metoprolol tartrate  50  mg Oral BID  . QUEtiapine  50 mg Oral QHS  . vitamin B-12  1,000 mcg Oral Daily  . Vitamin D (Ergocalciferol)  50,000 Units Oral Q7 days     LOS: 11 days   [facility]  [phone] Pager - Text Page per Shea Evans  If 7PM-7AM, please contact night-coverage per Amion 07/25/2020, 10:09 AM

## 2020-07-25 NOTE — Progress Notes (Signed)
Pt ate 50% of his dinner with assistance. Was able to bathe pt and turn on some music. Pt is now resting quietly, will continue to monitor.

## 2020-07-25 NOTE — Care Management Important Message (Signed)
Important Message  Patient Details IM Letter given to the Patient. Name: [name] [facility] Sr. MRN: [id number] Date of Birth: 04-Dec-1950   Medicare Important Message Given:  Yes     Kerin Salen 07/25/2020, 11:09 AM

## 2020-07-25 NOTE — Plan of Care (Signed)
  Problem: Safety: Goal: Ability to remain free from injury will improve Outcome: Progressing   Problem: Skin Integrity: Goal: Risk for impaired skin integrity will decrease Outcome: Progressing   

## 2020-07-26 DIAGNOSIS — F0391 Unspecified dementia with behavioral disturbance: Secondary | ICD-10-CM | POA: Diagnosis not present

## 2020-07-26 DIAGNOSIS — R410 Disorientation, unspecified: Secondary | ICD-10-CM | POA: Diagnosis not present

## 2020-07-26 DIAGNOSIS — E538 Deficiency of other specified B group vitamins: Secondary | ICD-10-CM | POA: Diagnosis not present

## 2020-07-26 LAB — GLUCOSE, CAPILLARY
Glucose-Capillary: 124 mg/dL — ABNORMAL HIGH (ref 70–99)
Glucose-Capillary: 122 mg/dL — ABNORMAL HIGH (ref 70–99)
Glucose-Capillary: 117 mg/dL — ABNORMAL HIGH (ref 70–99)
Glucose-Capillary: 107 mg/dL — ABNORMAL HIGH (ref 70–99)

## 2020-07-26 MED ORDER — DIVALPROEX SODIUM 125 MG PO CSDR
250.0000 mg | DELAYED_RELEASE_CAPSULE | Freq: Every day | ORAL | Status: DC
  Administered 2020-07-26 – 2020-08-02 (×8): 250 mg via ORAL
  Filled 2020-07-26 (×9): qty 2

## 2020-07-26 MED ORDER — LORAZEPAM 2 MG/ML IJ SOLN
0.5000 mg | Freq: Four times a day (QID) | INTRAMUSCULAR | Status: DC
  Administered 2020-07-26 – 2020-07-30 (×14): 0.5 mg via INTRAMUSCULAR
  Filled 2020-07-26 (×15): qty 1

## 2020-07-26 MED ORDER — QUETIAPINE FUMARATE 25 MG PO TABS
25.0000 mg | ORAL_TABLET | Freq: Every day | ORAL | Status: DC
  Administered 2020-07-26 – 2020-07-27 (×2): 25 mg via ORAL
  Filled 2020-07-26 (×2): qty 1

## 2020-07-26 MED ORDER — DIVALPROEX SODIUM 125 MG PO CSDR
500.0000 mg | DELAYED_RELEASE_CAPSULE | Freq: Every day | ORAL | Status: DC
  Administered 2020-07-26 – 2020-08-09 (×15): 500 mg via ORAL
  Filled 2020-07-26 (×15): qty 4

## 2020-07-26 NOTE — Plan of Care (Signed)
  Problem: Clinical Measurements: Goal: Will remain free from infection Outcome: Progressing   Problem: Safety: Goal: Ability to remain free from injury will improve Outcome: Progressing   Problem: Skin Integrity: Goal: Risk for impaired skin integrity will decrease Outcome: Progressing

## 2020-07-26 NOTE — Plan of Care (Signed)
  Problem: Safety: Goal: Ability to remain free from injury will improve 07/26/2020 1549 by Dorene Sorrow, RN Outcome: Progressing 07/26/2020 1544 by Dorene Sorrow, RN Outcome: Progressing   Problem: Clinical Measurements: Goal: Respiratory complications will improve 07/26/2020 1549 by Dorene Sorrow, RN Outcome: Progressing 07/26/2020 1544 by Dorene Sorrow, RN Outcome: Progressing   Problem: Clinical Measurements: Goal: Will remain free from infection 07/26/2020 1549 by Dorene Sorrow, RN Outcome: Progressing 07/26/2020 1544 by Dorene Sorrow, RN Outcome: Progressing

## 2020-07-26 NOTE — Progress Notes (Addendum)
[name] Dupler [place] Sr.  WUJ:[id number] DOB: 1951-04-22 DOA: 07/13/2020 PCP: [name], DO    Brief Narrative:  69yo with a history of atrial fibrillation, depression, and dementia who was brought to the ED with increasing agitation over a week as reported by his wife.  His dementia was noted to have progressed significantly over the last 6 months.  Multiple medications have been attempted in the outpatient setting to no avail.  He has been found wandering the neighborhood at night with a flashlight, and 1 [place] anyone where he was.  In the ED CT head was negative for acute findings and there was no evidence of an acute infection.  Significant Events:  11/3 admit via ED  Antimicrobials:  None  DVT prophylaxis: Eliquis  Subjective: Patient noted to be somnolent though easily arousable.  Sitter at bedside who mentions that he was talking a few minutes ago.  It looks like his sleep cycle has reversed.  He is noted to be sleeping most of the daytime and awake during the nighttime.     Assessment & Plan:  Acute delirium -progressive chronic dementia Patient's presentation thought to be due to progression of his dementia precipitated also by recent death of family member. Psychiatry evaluated and made recommendations regarding medical therapy.  Patient was started on Tegretol but had to be discontinued after a few days. RPR nonreactive, ethanol undetectable at admission, ammonia essentially normal, TSH normal, thiamine normal. Cortisol level noted to be normal. B12 level was noted to be low and being supplemented as mentioned below. Consideration was given to obtaining MRI of the brain however patient would not be able to cooperate with the study and it is unlikely that MRI will change our treatment strategy.  CT scan of the head at the time of admission did not show any acute findings. This presentation is unlikely to be due to zoster  encephalitis.  So patient was started on Seroquel and Haldol as needed.  QTC noted to be mildly prolonged.  Did not make any changes to his antipsychotics.  He was also placed on scheduled Ativan.  Dose is being decreased gradually.   Psychiatry was reconsulted.  They recommended starting Depakote which was done.  We will increase the nighttime dose of Depakote tonight.  Cut back on the dose of Seroquel. Landscape architect.  Continue restraints as patient tends to try and get out of bed which may result in fall and injury. Patient is involuntarily committed.  Please see paperwork for details.  Concern for alcohol withdrawal  No known hx of alcoholism, but w/ recent depression and life stressors it was felt conceivable pt could have been abusing EtOH surreptitiously. A trial on the CIWA protocol did not prove to be helpful.  No withdrawal symptoms noted.  B12 deficiency B12 quite low at 179 on outpt check 06/30/20.  Received vitamin B12 subcutaneously daily for 7 days.  Started on oral supplementation.  Herpes zoster He has lesions across to his left chest and flank area.  Continue contact isolation.  He was initially placed on Zovirax but was discontinued due to worsening confusion.   Lesions have been improving.  Vitamin D deficiency 25 hydroxy vitamin D was low at 72 as assessed on 06/30/2020.  Being replaced.  Depression Continue usual home medications. Acute situational worsening recently due to death of a very close family member which could be precipitating some of his symptoms  Hypothyroidism Continue usual  home medications. TSH and FT4 within normal range   DM 2 CBGs are reasonably well controlled.  HbA1c 6.0.  Hypokalemia/hyponatremia Noted to be stable when last checked.  Checking every few days.  Will check again tomorrow.  Essential hypertension Blood pressure reasonably controlled readings due to agitation.  Chronic atrial fibrillation  Continue usual metoprolol and  chronic Eliquis. Persistently elevated heart rate likely related to agitation.  If his mentation does not return to baseline then may have to discontinue his anticoagulation as his risk of having an adverse effect, which in this case would be bleeding as a result of a fall, would be much higher than benefit.    Prolonged QTC Monitor electrolytes.  QTC noted to be 508 when last checked.  We will check it again tomorrow..  History of ulcerative colitis Noted to be on Imuran for same.  Goals of care Patient seems calmer over the last 24-48 hrs. compared to how he was last week.  We will give Depakote a chance to further improve his mental status.  If there is no appreciable change in the next few days then may have to involve palliative care as well.  His wife is unable to take care of him in his current state.  He will need placement eventually.  DVT prophylaxis: Noted to be on Eliquis. Code Status: DNR Family Communication: Wife updated today. Disposition: Patient continues to remain agitated and confused.  His wife will be unable to take care of him in his current state.  Will most likely need placement which will also be challenging.  Social worker is following.    Status is: Inpatient  Remains inpatient appropriate because:Altered mental status and Unsafe d/c plan   Dispo: The patient is from: Home              Anticipated d/c is to: To be determined              Anticipated d/c date is: 3 days              Patient currently is not medically stable to d/c.     Consultants:  Psychiatry  Objective: Blood pressure (!) 153/71, pulse 71, temperature 97.8 F (36.6 C), resp. rate 16, height 5' 7"  (1.702 m), weight 115.2 kg, SpO2 91 %.  Intake/Output Summary (Last 24 hours) at 07/26/2020 0930 Last data filed at 07/25/2020 1700 Gross per 24 hour  Intake 300 ml  Output --  Net 300 ml   Filed Weights   07/20/20 0922  Weight: 115.2 kg    Examination:  General appearance:  Somnolent this morning.  No distress. Resp: Clear to auscultation bilaterally.  Normal effort Cardio: S1-S2 is normal regular.  No S3-S4.  No rubs murmurs or bruit GI: Abdomen is soft.  Nontender nondistended.  Bowel sounds are present normal.  No masses organomegaly Neurologic: Noted to be disoriented.  No obvious focal deficits noted.      CBC: Recent Labs  Lab 07/22/20 0400  WBC 8.7  HGB 15.3  HCT 44.3  MCV 96.7  PLT 782   Basic Metabolic Panel: Recent Labs  Lab 07/20/20 0421 07/21/20 0416 07/24/20 0341  NA 131* 132* 134*  K 3.4* 4.2 3.9  CL 95* 97* 98  CO2 26 26 26   GLUCOSE 110* 114* 111*  BUN 11 11 13   CREATININE 0.88 0.86 1.04  CALCIUM 8.9 8.9 9.3  MG 2.1  --  2.0   GFR: Estimated Creatinine Clearance: 79 mL/min (by C-G  formula based on SCr of 1.04 mg/dL).  Liver Function Tests: Recent Labs  Lab 07/20/20 0421  AST 68*  ALT 53*  ALKPHOS 49  BILITOT 1.4*  PROT 6.8  ALBUMIN 3.7    HbA1C: Hemoglobin A1C  Date/Time Value Ref Range Status  08/09/2017 12:00 AM 6.0  Final   Hgb A1c MFr Bld  Date/Time Value Ref Range Status  06/30/2020 11:07 AM 6.9 (H) 4.6 - 6.5 % Final    Comment:    Glycemic Control Guidelines for People with Diabetes:Non Diabetic:  <6%Goal of Therapy: <7%Additional Action Suggested:  >8%   02/17/2019 07:07 PM 5.8 (H) 4.8 - 5.6 % Final    Comment:    (NOTE) Pre diabetes:          5.7%-6.4% Diabetes:              >6.4% Glycemic control for   <7.0% adults with diabetes     CBG: Recent Labs  Lab 07/25/20 0741 07/25/20 1126 07/25/20 1636 07/25/20 2155 07/26/20 0752  GLUCAP 113* 123* 96 111* 124*    No results found for this or any previous visit (from the past 240 hour(s)).   Scheduled Meds: . amLODipine  5 mg Oral Daily  . apixaban  5 mg Oral BID  . atorvastatin  20 mg Oral Daily  . azaTHIOprine  200 mg Oral Daily  . divalproex  250 mg Oral Q12H  . DULoxetine  60 mg Oral Daily  . insulin aspart  0-9 Units  Subcutaneous TID WC  . levothyroxine  25 mcg Oral Q0600  . LORazepam  1 mg Intramuscular Q6H  . metoprolol tartrate  50 mg Oral BID  . QUEtiapine  50 mg Oral QHS  . vitamin B-12  1,000 mcg Oral Daily  . Vitamin D (Ergocalciferol)  50,000 Units Oral Q7 days     LOS: 12 days   [facility] Office  [phone] Pager - Text Page per Shea Evans  If 7PM-7AM, please contact night-coverage per Amion 07/26/2020, 9:30 AM

## 2020-07-27 DIAGNOSIS — R9431 Abnormal electrocardiogram [ECG] [EKG]: Secondary | ICD-10-CM | POA: Diagnosis not present

## 2020-07-27 DIAGNOSIS — R410 Disorientation, unspecified: Secondary | ICD-10-CM | POA: Diagnosis not present

## 2020-07-27 LAB — COMPREHENSIVE METABOLIC PANEL
ALT: 49 U/L — ABNORMAL HIGH (ref 0–44)
AST: 44 U/L — ABNORMAL HIGH (ref 15–41)
Albumin: 3.9 g/dL (ref 3.5–5.0)
Alkaline Phosphatase: 52 U/L (ref 38–126)
Anion gap: 10 (ref 5–15)
BUN: 16 mg/dL (ref 8–23)
CO2: 26 mmol/L (ref 22–32)
Calcium: 9.3 mg/dL (ref 8.9–10.3)
Chloride: 100 mmol/L (ref 98–111)
Creatinine, Ser: 0.99 mg/dL (ref 0.61–1.24)
GFR, Estimated: >60 mL/min (ref >60)
Glucose, Bld: 116 mg/dL — ABNORMAL HIGH (ref 70–99)
Potassium: 3.6 mmol/L (ref 3.5–5.1)
Sodium: 136 mmol/L (ref 135–145)
Total Bilirubin: 1.9 mg/dL — ABNORMAL HIGH (ref 0.3–1.2)
Total Protein: 6.7 g/dL (ref 6.5–8.1)

## 2020-07-27 LAB — GLUCOSE, CAPILLARY
Glucose-Capillary: 123 mg/dL — ABNORMAL HIGH (ref 70–99)
Glucose-Capillary: 117 mg/dL — ABNORMAL HIGH (ref 70–99)
Glucose-Capillary: 115 mg/dL — ABNORMAL HIGH (ref 70–99)
Glucose-Capillary: 107 mg/dL — ABNORMAL HIGH (ref 70–99)
Glucose-Capillary: 163 mg/dL — ABNORMAL HIGH (ref 70–99)

## 2020-07-27 LAB — CBC
HCT: 44.5 % (ref 39.0–52.0)
Hemoglobin: 15.5 g/dL (ref 13.0–17.0)
MCH: 33.6 pg (ref 26.0–34.0)
MCHC: 34.8 g/dL (ref 30.0–36.0)
MCV: 96.5 fL (ref 80.0–100.0)
Platelets: 237 10*3/uL (ref 150–400)
RBC: 4.61 MIL/uL (ref 4.22–5.81)
RDW: 14.9 % (ref 11.5–15.5)
WBC: 8.6 10*3/uL (ref 4.0–10.5)
nRBC: 0 % (ref 0.0–0.2)

## 2020-07-27 NOTE — Progress Notes (Signed)
Pt status update:  RN wanted to assess skin under restraints.  Security tem came up and helped readjust restrains on patient so RN could fully look at patient skin.  Patient's skin around the wrist and ankles look CDI.  When restrains were readjusted pt was uncooperative.  Pt very hard to redirect.  Pt trying to sit up.  Continue to monitor.

## 2020-07-27 NOTE — Progress Notes (Signed)
PROGRESS NOTE    [name] NAVAREZ Sr.  ELT:[id number] DOB: 1951/08/21 DOA: 07/13/2020 PCP: [name], DO    Chief Complaint  Patient presents with  . increase confusion    Brief Narrative: 69 year old gentleman prior history of atrial fibrillation on anticoagulation, dementia and depression brought to ED for increasing agitation.  As per the family he has been progressively having worsening dementia.  Multiple medications have been attempted in the outpatient setting without any improvement.  His initial CT of the head on admission was negative for acute findings without any evidence of acute infarction.  Today patient is alert following simple commands, taking pills by mouth and eating breakfast with assistance.  RN reports patient has been very agitated earlier this morning and last night requiring IV medication.  Assessment & Plan:   Active Problems:   AMS (altered mental status)   Acute delirium   Acute delirium superimposed on progressive chronic dementia precipitated by recent death in the family. Psychiatry consulted recommended medical therapy.  He was initially started on Tegretol but was discontinued. Initial CT of the head is negative for acute findings.  MRI could not be done as patient was not cooperative with the study and it is unlikely that MRI will change the treatment strategy.  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  His lab work showed negative ethanol on admission ammonia level is normal, TSH normal, thiamine normal, cortisol levels appear to be normal, RPR is nonreactive.  Vitamin B12 levels have been noted to be low and are being supplemented.  He was currently started on Seroquel 25 mg at bedtime, Haldol and Ativan.  Patient was also started on Depakote.  At this point patient is not safe for discharge as he continues to require sitter and restraints to prevent injury and fall to the patient.  Patient is currently involuntarily  committed at this time.  Chronic atrial fibrillation Rate control with metoprolol and on Eliquis for anticoagulation.  Recommend outpatient follow-up with cardiology regarding anticoagulation as patient has relative risk for falling due to confusion.   Prolonged QTC Keep potassium greater than 4 and magnesium greater than 2.  Recheck QTC later today.    Type 2 diabetes mellitus CBG (last 3)  Recent Labs    07/27/20 0221 07/27/20 0728 07/27/20 1152  GLUCAP 123* 117* 115*   Resume sliding scale insulin.   Hypothyroidism Continue with home medications.      Vitamin D deficiency Replacement ordered.     Hypertension Blood pressure parameters are slightly on the higher side due to agitation.   Vitamin B12 deficiency B12 levels came at 179 and asked Tober 2021, supplementation started.   History of depression Resume home medications at this time.    History of ulcerative colitis Continue with Imuran.  Goals of care Patient's mental status has improved when compared to yesterday.  Continue with the same regimen for now.  He continues to require restraints and sitter.  He is unable to take care of himself in his current state.  He will possibly need placement eventually as his wife is unable to take care of him.  Palliative care will be consulted for goals of care discussion. Meanwhile PT evaluation ordered.   DVT prophylaxis: Eliquis Code Status: DNR Family Communication: None at bedside Disposition:   Status is: Inpatient  Remains inpatient appropriate because:Inpatient level of care appropriate due to severity of illness , patient still in wrist and ankle restraints,, and confused and restless  and agitated requiring bedside sitter  Dispo:  Patient From: Home  Planned Disposition: [place]  Expected discharge date: 07/29/20  Medically stable for discharge: No        Consultants:   Psychiatry  Procedures: None Antimicrobials:  None  Subjective: Confused, restless.  As per the nurse patient did eat breakfast and was able to take pills.  Objective: Vitals:   07/26/20 1309 07/26/20 2014 07/26/20 2315 07/27/20 0439  BP: 140/74 (!) 144/83 136/70 125/68  Pulse: 84 77 67 76  Resp: 18 19  19   Temp: (!) 97.1 F (36.2 C) 98 F (36.7 C)  98.3 F (36.8 C)  TempSrc: Oral     SpO2: 94% 94%  92%  Weight:      Height:        Intake/Output Summary (Last 24 hours) at 07/27/2020 1151 Last data filed at 07/26/2020 2113 Gross per 24 hour  Intake 1060 ml  Output --  Net 1060 ml   Filed Weights   07/20/20 0922  Weight: 115.2 kg    Examination:  General exam: Appears calm and comfortable, not in any kind of distress, in wrist and ankle restraints. Respiratory system: Clear to auscultation. Respiratory effort normal. Cardiovascular system: S1 & S2 heard, RRR. No JVD,  No pedal edema. Gastrointestinal system: Abdomen is nondistended, soft and nontender. Normal bowel sounds heard. Central nervous system: Alert and confused, was able to follow follow simple commands.  Patient still in wrist and ankle restraints at this time Extremities: Symmetric 5 x 5 power. Skin: Crusted lesions Psychiatry: Patient is confused cannot be assessed    Data Reviewed: I have personally reviewed following labs and imaging studies  CBC: Recent Labs  Lab 07/22/20 0400 07/27/20 0407  WBC 8.7 8.6  HGB 15.3 15.5  HCT 44.3 44.5  MCV 96.7 96.5  PLT 232 409    Basic Metabolic Panel: Recent Labs  Lab 07/21/20 0416 07/24/20 0341 07/27/20 0407  NA 132* 134* 136  K 4.2 3.9 3.6  CL 97* 98 100  CO2 26 26 26   GLUCOSE 114* 111* 116*  BUN 11 13 16   CREATININE 0.86 1.04 0.99  CALCIUM 8.9 9.3 9.3  MG  --  2.0  --     GFR: Estimated Creatinine Clearance: 83 mL/min (by C-G formula based on SCr of 0.99 mg/dL).  Liver Function Tests: Recent Labs  Lab 07/27/20 0407  AST 44*  ALT 49*  ALKPHOS 52  BILITOT 1.9*  PROT 6.7   ALBUMIN 3.9    CBG: Recent Labs  Lab 07/26/20 1156 07/26/20 1633 07/26/20 2015 07/27/20 0221 07/27/20 0728  GLUCAP 122* 117* 107* 123* 117*     No results found for this or any previous visit (from the past 240 hour(s)).       Radiology Studies: No results found.      Scheduled Meds: . amLODipine  5 mg Oral Daily  . apixaban  5 mg Oral BID  . atorvastatin  20 mg Oral Daily  . azaTHIOprine  200 mg Oral Daily  . divalproex  250 mg Oral Daily  . divalproex  500 mg Oral QHS  . DULoxetine  60 mg Oral Daily  . insulin aspart  0-9 Units Subcutaneous TID WC  . levothyroxine  25 mcg Oral Q0600  . LORazepam  0.5 mg Intramuscular Q6H  . metoprolol tartrate  50 mg Oral BID  . QUEtiapine  25 mg Oral QHS  . vitamin B-12  1,000 mcg Oral Daily  .  Vitamin D (Ergocalciferol)  50,000 Units Oral Q7 days   Continuous Infusions:   LOS: 13 days       Hosie Poisson, MD [facility]   To contact the attending provider between 7A-7P or the covering provider during after hours 7P-7A, please log into the web site [email or web address] and access using universal [place] password for that web site. If you do not have the password, please call the hospital operator.  07/27/2020, 11:51 AM

## 2020-07-27 NOTE — TOC Progression Note (Signed)
Transition of Care (TOC) - Progression Note    Patient Details  Name: CURRY SEEFELDT Sr. MRN: [id number] Date of Birth: March 03, 1951  Transition of Care [facility]) CM/SW [place], [place] Phone Number: 07/27/2020, 10:25 AM  Clinical Narrative:  Patient has been IVC'd again, next IVC is due on 11/22, Monday.  Spoke with wife today about MCD application process.  She states she has retained an attorney who she will work with on this process, and has a meeting on Monday.  In the meantime, I gave her information on ePASS application process/instructions, and encouraged her to apply directly to keep that process moving forward. TOC will continue to follow during the course of hospitalization.      Expected Discharge Plan: [place] Barriers to Discharge: Continued Medical Work up  Expected Discharge Plan and Services Expected Discharge Plan: [place] In-house Referral: Clinical Social Work Discharge Planning Services: CM Consult Post Acute Care Choice: [place] arrangements for the past 2 months: Single Family Home                                       Social Determinants of Health (SDOH) Interventions    Readmission Risk Interventions Readmission Risk Prevention Plan 02/18/2019  Transportation Screening Complete  PCP or Specialist Appt within 3-5 Days Complete  HRI or [place] Complete  Social Work Consult for [place] Planning/Counseling Complete  Palliative Care Screening Not Applicable  Medication Review Press photographer) Complete  Some recent data might be hidden

## 2020-07-27 NOTE — Progress Notes (Signed)
Order placed for IV  IV team inserted x1 and pt moved arm even with staff holding arm down. Pt became agitated and able to raise self close enough to tell IV team staff to get needle out of him.  Unsuccessful IV attempt x1. Pt calmed down after repeat stroking of head/forehead. Wrist restraint tied back to appropriate location.

## 2020-07-28 DIAGNOSIS — R9431 Abnormal electrocardiogram [ECG] [EKG]: Secondary | ICD-10-CM | POA: Diagnosis not present

## 2020-07-28 DIAGNOSIS — R410 Disorientation, unspecified: Secondary | ICD-10-CM | POA: Diagnosis not present

## 2020-07-28 LAB — GLUCOSE, CAPILLARY
Glucose-Capillary: 105 mg/dL — ABNORMAL HIGH (ref 70–99)
Glucose-Capillary: 135 mg/dL — ABNORMAL HIGH (ref 70–99)
Glucose-Capillary: 105 mg/dL — ABNORMAL HIGH (ref 70–99)
Glucose-Capillary: 107 mg/dL — ABNORMAL HIGH (ref 70–99)

## 2020-07-28 MED ORDER — QUETIAPINE FUMARATE 25 MG PO TABS
50.0000 mg | ORAL_TABLET | Freq: Every day | ORAL | Status: DC
  Administered 2020-07-28 – 2020-08-01 (×5): 50 mg via ORAL
  Filled 2020-07-28 (×5): qty 2

## 2020-07-28 NOTE — Progress Notes (Signed)
OT Cancellation Note  Patient Details Name: [name]. MRN: [id number] DOB: [birth year]   Cancelled Treatment:    Reason Eval/Treat Not Completed: OT screened, no needs identified, will sign off. Patient is in 4 point restraints. Per RN, advised not to unrestrain at this time. Please reorder if patient is able to remain out of restraints and able to participate.  Delbert Phenix OT OT pager: [phone]  Rosemary Holms 07/28/2020, 9:36 AM

## 2020-07-28 NOTE — Progress Notes (Signed)
PT Cancellation Note  Patient Details Name: [name] MENTINK Sr. MRN: [id number] DOB: February 22, 1951   Cancelled Treatment:    Reason Eval/Treat Not Completed: PT screened, no needs identified, will sign off, patient is in 4 point restraints. Per RN, advised not to unrestrain at this time. Please reorder if patient is able to remain out of restraints and able to participate.   [name], [name] 07/28/2020, 9:05 AM [place] Pager [phone] Office [phone]

## 2020-07-28 NOTE — Progress Notes (Signed)
[name] NOTE    DEMARQUEZ CIOLEK Sr.  ZSM:[id number] DOB: [birth year] DOA: 07/13/2020 PCP: [name], DO    Chief Complaint  Patient presents with  . increase confusion    Brief Narrative: 69 year old gentleman prior history of atrial fibrillation on anticoagulation, dementia and depression brought to ED for increasing agitation.  As per the family he has been progressively having worsening dementia.  Multiple medications have been attempted in the outpatient setting without any improvement.  His initial CT of the head on admission was negative for acute findings without any evidence of acute infarction.   Pt seen and examined this am. He is more agitated today compared to yesterday. He refused meds today.  Psychiatry consulted to see if he needs Geodon injection. Patient is very confused agitated.  Assessment & Plan:   Active Problems:   AMS (altered mental status)   Acute delirium   Acute delirium superimposed on progressive chronic dementia precipitated by recent death in the family. Psychiatry consulted recommended medical therapy.  He was initially started on Tegretol but was discontinued. Initial CT of the head is negative for acute findings.  MRI could not be done as patient was not cooperative with the study and it is unlikely that MRI will change the treatment strategy.  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  His lab work showed negative ethanol on admission,  ammonia level is normal, TSH normal, thiamine normal, cortisol levels appear to be normal, RPR is nonreactive.  Vitamin B12 levels have been noted to be low and are being supplemented.   He was currently started on Seroquel 25 mg at bedtime, which has been increased to 50 mg, Haldol and Ativan.  Patient was also started on Depakote.   At this point patient is not safe for discharge as he continues to require sitter and restraints to prevent injury and fall to the patient.  Patient is  currently involuntarily committed at this time.  Chronic atrial fibrillation Rate control with metoprolol and on Eliquis for anticoagulation.  Recommend outpatient follow-up with cardiology regarding anticoagulation as patient has relative risk for falling due to confusion.    Prolonged QTC Keep potassium greater than 4 and magnesium greater than 2.  Unable to check QTC as patient is very agitated and in four-point restraints    Type 2 diabetes mellitus CBG (last 3)  Recent Labs    07/27/20 2242 07/28/20 0738 07/28/20 1123  GLUCAP 163* 105* 135*   Resume sliding scale insulin.  No changes in medications   Hypothyroidism Continue with home medications.   Vitamin D deficiency Replacement ordered.   Hypertension Blood pressure parameters are optimal   Vitamin B12 deficiency B12 levels came at 179 ,  supplementation started.   History of depression Resume home medications at this time.    History of ulcerative colitis Continue with Imuran.  Goals of care Patient's mental status has improved when compared to yesterday.  Continue with the same regimen for now.  He continues to require restraints and sitter.  He is unable to take care of himself in his current state.  He will possibly need placement eventually as his wife is unable to take care of him.  Palliative care will be consulted for goals of care discussion. Meanwhile PT evaluation ordered.  Psychiatric reconsulted as patient remains agitated despite Seroquel, Depakote, Haldol and Ativan.   DVT prophylaxis: Eliquis Code Status: DNR Family Communication: None at bedside Disposition:   Status is: Inpatient  Remains inpatient appropriate because:Inpatient level of care appropriate due to severity of illness , patient still in wrist and ankle restraints,, and confused and restless and agitated requiring bedside sitter  Dispo:  Patient From: Home  Planned Disposition: [place]  Expected  discharge date: 08/01/20  Medically stable for discharge: No        Consultants:   Psychiatry  Procedures: None Antimicrobials: None  Subjective: More agitated today than yesterday  Objective: Vitals:   07/27/20 2130 07/27/20 2131 07/28/20 0708 07/28/20 1300  BP:  (!) 149/94 115/86 119/63  Pulse:  89 (!) 49 92  Resp:  18 18 20   Temp: 98 F (36.7 C)  98.3 F (36.8 C) 98.3 F (36.8 C)  TempSrc: Oral  Axillary Axillary  SpO2:  93% 95% 99%  Weight:      Height:        Intake/Output Summary (Last 24 hours) at 07/28/2020 1444 Last data filed at 07/28/2020 1151 Gross per 24 hour  Intake 960 ml  Output 550 ml  Net 410 ml   Filed Weights   07/20/20 0922  Weight: 115.2 kg    Examination:  General exam: Patient does not appear to be in distress but very agitated Respiratory system: Clear to auscultation bilaterally no wheezing or rhonchi Cardiovascular system: S1-S2 heard, regular rate rhythm, no JVD Gastrointestinal system: Abdomen soft and nondistended bowel sounds normal Central nervous system: Confused and agitated and in four-point restraints at this time Extremities: No pedal edema Skin: Crusted lesions Psychiatry: Patient remains confused, cannot be assessed    Data Reviewed: I have personally reviewed following labs and imaging studies  CBC: Recent Labs  Lab 07/22/20 0400 07/27/20 0407  WBC 8.7 8.6  HGB 15.3 15.5  HCT 44.3 44.5  MCV 96.7 96.5  PLT 232 573    Basic Metabolic Panel: Recent Labs  Lab 07/24/20 0341 07/27/20 0407  NA 134* 136  K 3.9 3.6  CL 98 100  CO2 26 26  GLUCOSE 111* 116*  BUN 13 16  CREATININE 1.04 0.99  CALCIUM 9.3 9.3  MG 2.0  --     GFR: Estimated Creatinine Clearance: 83 mL/min (by C-G formula based on SCr of 0.99 mg/dL).  Liver Function Tests: Recent Labs  Lab 07/27/20 0407  AST 44*  ALT 49*  ALKPHOS 52  BILITOT 1.9*  PROT 6.7  ALBUMIN 3.9    CBG: Recent Labs  Lab 07/27/20 1152  07/27/20 1547 07/27/20 2242 07/28/20 0738 07/28/20 1123  GLUCAP 115* 107* 163* 105* 135*     No results found for this or any previous visit (from the past 240 hour(s)).       Radiology Studies: No results found.      Scheduled Meds: . amLODipine  5 mg Oral Daily  . apixaban  5 mg Oral BID  . atorvastatin  20 mg Oral Daily  . azaTHIOprine  200 mg Oral Daily  . divalproex  250 mg Oral Daily  . divalproex  500 mg Oral QHS  . DULoxetine  60 mg Oral Daily  . insulin aspart  0-9 Units Subcutaneous TID WC  . levothyroxine  25 mcg Oral Q0600  . LORazepam  0.5 mg Intramuscular Q6H  . metoprolol tartrate  50 mg Oral BID  . QUEtiapine  50 mg Oral QHS  . vitamin B-12  1,000 mcg Oral Daily  . Vitamin D (Ergocalciferol)  50,000 Units Oral Q7 days   Continuous Infusions:   LOS: 14 days  Hosie Poisson, MD [facility]   To contact the attending provider between 7A-7P or the covering provider during after hours 7P-7A, please log into the web site [email or web address] and access using universal [place] password for that web site. If you do not have the password, please call the hospital operator.  07/28/2020, 2:44 PM

## 2020-07-29 ENCOUNTER — Other Ambulatory Visit: Payer: Medicare HMO

## 2020-07-29 DIAGNOSIS — R9431 Abnormal electrocardiogram [ECG] [EKG]: Secondary | ICD-10-CM | POA: Diagnosis not present

## 2020-07-29 DIAGNOSIS — R410 Disorientation, unspecified: Secondary | ICD-10-CM | POA: Diagnosis not present

## 2020-07-29 LAB — BASIC METABOLIC PANEL
Anion gap: 12 (ref 5–15)
BUN: 16 mg/dL (ref 8–23)
CO2: 28 mmol/L (ref 22–32)
Calcium: 9.5 mg/dL (ref 8.9–10.3)
Chloride: 100 mmol/L (ref 98–111)
Creatinine, Ser: 1.05 mg/dL (ref 0.61–1.24)
GFR, Estimated: >60 mL/min (ref >60)
Glucose, Bld: 113 mg/dL — ABNORMAL HIGH (ref 70–99)
Potassium: 3.7 mmol/L (ref 3.5–5.1)
Sodium: 140 mmol/L (ref 135–145)

## 2020-07-29 LAB — GLUCOSE, CAPILLARY
Glucose-Capillary: 117 mg/dL — ABNORMAL HIGH (ref 70–99)
Glucose-Capillary: 182 mg/dL — ABNORMAL HIGH (ref 70–99)
Glucose-Capillary: 112 mg/dL — ABNORMAL HIGH (ref 70–99)
Glucose-Capillary: 108 mg/dL — ABNORMAL HIGH (ref 70–99)

## 2020-07-29 LAB — MAGNESIUM: Magnesium: 2.2 mg/dL (ref 1.7–2.4)

## 2020-07-29 MED ORDER — POTASSIUM CHLORIDE CRYS ER 20 MEQ PO TBCR
40.0000 meq | EXTENDED_RELEASE_TABLET | Freq: Once | ORAL | Status: AC
  Administered 2020-07-29: 40 meq via ORAL
  Filled 2020-07-29: qty 2

## 2020-07-29 NOTE — Progress Notes (Signed)
PROGRESS NOTE    [name] GLEAVES Sr.  GEZ:[id number] DOB: [birth year] DOA: 07/13/2020 PCP: [name], DO    Chief Complaint  Patient presents with  . increase confusion    Brief Narrative: 69 year old gentleman prior history of atrial fibrillation on anticoagulation, dementia and depression brought to ED for increasing agitation.  As per the family he has been progressively having worsening dementia.  Multiple medications have been attempted in the outpatient setting without any improvement.  His initial CT of the head on admission was negative for acute findings without any evidence of acute infarction.   Pt seen and examined, he was up and walking with the help of two RN and nurse tech. He is still confused, but able to answer simple questions.    Assessment & Plan:   Active Problems:   AMS (altered mental status)   Acute delirium   Acute delirium superimposed on progressive chronic dementia precipitated by recent death in the family. Psychiatry consulted recommended medical therapy.  He was initially started on Tegretol but was discontinued. Initial CT of the head is negative for acute findings.  MRI could not be done as patient was not cooperative with the study and it is unlikely that MRI will change the treatment strategy.  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  His lab work showed negative ethanol on admission,  ammonia level is normal, TSH normal, thiamine normal, cortisol levels appear to be normal, RPR is nonreactive.  Vitamin B12 levels have been noted to be low and are being supplemented.   He was currently started on Seroquel 25 mg at bedtime, which has been increased to 50 mg,. Try to minimize haldol and ativan if possible.    Patient was also started on Depakote.   At this point patient is not safe for discharge as he continues to require sitter and restraints to prevent injury and fall to the patient.  Patient is currently  involuntarily committed at this time.   Chronic atrial fibrillation Rate control with metoprolol and on Eliquis for anticoagulation.  Recommend outpatient follow-up with cardiology regarding anticoagulation as patient has relative risk for falling due to confusion.    Prolonged QTC Keep potassium greater than 4 and magnesium greater than 2. Last qtc is >500 .     Type 2 diabetes mellitus  well controlled cbg's CBG (last 3)  Recent Labs    07/28/20 2103 07/29/20 0803 07/29/20 1143  GLUCAP 107* 117* 182*   Resume sliding scale insulin.  No changes in medications   Hypothyroidism Continue with home medications.   Vitamin D deficiency Replacement ordered.   Hypertension BP parameters are optimal.    Vitamin B12 deficiency B12 levels came at 179 ,  supplementation started.   History of depression Continue with Cymbalta.    History of ulcerative colitis Continue with Imuran.  Goals of care Patient's mental status has improved when compared to yesterday.  Continue with the same regimen for now.  He continues to require restraints and sitter.  He is unable to take care of himself in his current state.  He will possibly need placement eventually as his wife is unable to take care of him.  Palliative care will be consulted for goals of care discussion. Meanwhile PT evaluation ordered.  Psychiatric reconsulted as patient remains agitated despite Seroquel, Depakote, Haldol and Ativan.   DVT prophylaxis: Eliquis Code Status: DNR Family Communication: None at bedside Disposition:   Status is: Inpatient  Remains inpatient  appropriate because:Inpatient level of care appropriate due to severity of illness , patient still in wrist and ankle restraints,, and confused and restless and agitated requiring bedside sitter  Dispo:  Patient From: Home  Planned Disposition: [place]  Expected discharge date: 08/01/20  Medically stable for discharge:  No        Consultants:   Psychiatry  Procedures: None Antimicrobials: None  Subjective: Alert , still in restraints. Not agitated as before.   Objective: Vitals:   07/28/20 0708 07/28/20 1300 07/28/20 2101 07/29/20 1630  BP: 115/86 119/63 (!) 154/96 (!) 119/100  Pulse: (!) 49 92 (!) 54 90  Resp: 18 20 17 16   Temp: 98.3 F (36.8 C) 98.3 F (36.8 C) 98.6 F (37 C) 98.8 F (37.1 C)  TempSrc: Axillary Axillary Oral Oral  SpO2: 95% 99% 95%   Weight:      Height:        Intake/Output Summary (Last 24 hours) at 07/29/2020 1836 Last data filed at 07/29/2020 1100 Gross per 24 hour  Intake 594 ml  Output --  Net 594 ml   Filed Weights   07/20/20 0922  Weight: 115.2 kg    Examination:  General exam:  Pt is alert, does not appear to agitated,  Respiratory system: Clear to auscultation, no wheezing  Or rhonchi.  Cardiovascular system: S1S2, RRR, no JVD, no pedal edema.  Gastrointestinal system: Abdomen  Is soft, non tender, bowel sounds heard.  Central nervous system: no focal deficits. But appears confused,  Extremities: no cyanosis or clubbing.  Skin: no ulcers.  Psychiatry: not agitated.     Data Reviewed: I have personally reviewed following labs and imaging studies  CBC: Recent Labs  Lab 07/27/20 0407  WBC 8.6  HGB 15.5  HCT 44.5  MCV 96.5  PLT 789    Basic Metabolic Panel: Recent Labs  Lab 07/24/20 0341 07/27/20 0407 07/29/20 0437  NA 134* 136 140  K 3.9 3.6 3.7  CL 98 100 100  CO2 26 26 28   GLUCOSE 111* 116* 113*  BUN 13 16 16   CREATININE 1.04 0.99 1.05  CALCIUM 9.3 9.3 9.5  MG 2.0  --  2.2    GFR: Estimated Creatinine Clearance: 78.2 mL/min (by C-G formula based on SCr of 1.05 mg/dL).  Liver Function Tests: Recent Labs  Lab 07/27/20 0407  AST 44*  ALT 49*  ALKPHOS 52  BILITOT 1.9*  PROT 6.7  ALBUMIN 3.9    CBG: Recent Labs  Lab 07/28/20 1123 07/28/20 1643 07/28/20 2103 07/29/20 0803 07/29/20 1143  GLUCAP 135*  105* 107* 117* 182*     No results found for this or any previous visit (from the past 240 hour(s)).       Radiology Studies: No results found.      Scheduled Meds: . amLODipine  5 mg Oral Daily  . apixaban  5 mg Oral BID  . atorvastatin  20 mg Oral Daily  . azaTHIOprine  200 mg Oral Daily  . divalproex  250 mg Oral Daily  . divalproex  500 mg Oral QHS  . DULoxetine  60 mg Oral Daily  . insulin aspart  0-9 Units Subcutaneous TID WC  . levothyroxine  25 mcg Oral Q0600  . LORazepam  0.5 mg Intramuscular Q6H  . metoprolol tartrate  50 mg Oral BID  . QUEtiapine  50 mg Oral QHS  . vitamin B-12  1,000 mcg Oral Daily  . Vitamin D (Ergocalciferol)  50,000 Units Oral Q7  days   Continuous Infusions:   LOS: 15 days       Hosie Poisson, MD [facility]   To contact the attending provider between 7A-7P or the covering provider during after hours 7P-7A, please log into the web site [email or web address] and access using universal [place] password for that web site. If you do not have the password, please call the hospital operator.  07/29/2020, 6:36 PM

## 2020-07-30 ENCOUNTER — Inpatient Hospital Stay (HOSPITAL_COMMUNITY): Payer: Medicare HMO

## 2020-07-30 DIAGNOSIS — I517 Cardiomegaly: Secondary | ICD-10-CM | POA: Diagnosis not present

## 2020-07-30 DIAGNOSIS — R410 Disorientation, unspecified: Secondary | ICD-10-CM | POA: Diagnosis not present

## 2020-07-30 DIAGNOSIS — R059 Cough, unspecified: Secondary | ICD-10-CM | POA: Diagnosis not present

## 2020-07-30 DIAGNOSIS — I4891 Unspecified atrial fibrillation: Secondary | ICD-10-CM | POA: Diagnosis not present

## 2020-07-30 DIAGNOSIS — R9431 Abnormal electrocardiogram [ECG] [EKG]: Secondary | ICD-10-CM | POA: Diagnosis not present

## 2020-07-30 LAB — GLUCOSE, CAPILLARY
Glucose-Capillary: 119 mg/dL — ABNORMAL HIGH (ref 70–99)
Glucose-Capillary: 147 mg/dL — ABNORMAL HIGH (ref 70–99)
Glucose-Capillary: 120 mg/dL — ABNORMAL HIGH (ref 70–99)
Glucose-Capillary: 110 mg/dL — ABNORMAL HIGH (ref 70–99)

## 2020-07-30 MED ORDER — DIPHENHYDRAMINE-ZINC ACETATE 2-0.1 % EX CREA
TOPICAL_CREAM | Freq: Three times a day (TID) | CUTANEOUS | Status: DC | PRN
  Filled 2020-07-30: qty 28

## 2020-07-30 MED ORDER — LORAZEPAM 1 MG PO TABS
1.0000 mg | ORAL_TABLET | Freq: Three times a day (TID) | ORAL | Status: DC | PRN
  Administered 2020-07-30 – 2020-07-31 (×2): 1 mg via ORAL
  Filled 2020-07-30 (×2): qty 1

## 2020-07-30 NOTE — Progress Notes (Signed)
PROGRESS NOTE    [name] RUAN Sr.  MIW:[id number] DOB: 1951/08/16 DOA: 07/13/2020 PCP: [name], DO    Chief Complaint  Patient presents with  . increase confusion    Brief Narrative: 68 year old gentleman prior history of atrial fibrillation on anticoagulation, dementia and depression brought to ED for increasing agitation.  As per the family he has been progressively having worsening dementia.  Multiple medications have been attempted in the outpatient setting without any improvement.  His initial CT of the head on admission was negative for acute findings without any evidence of acute infarction.  Patient is currently on Depakote ER to 50 mg during the day and 500 at bedtime along with Seroquel 50 mg at bedtime and as needed Ativan 1 mg every 8 hours.  He has been in four-point restraints the last 3 to 4 days with brief.'s where we were able to take the restraints off.  [facility].  Patient seen and examined at bedside he has walked yesterday with the help of nursing Dr. Dario Guardian yesterday and today but continues to be agitated and restless requiring restraints.   Assessment & Plan:   Active Problems:   AMS (altered mental status)   Acute delirium   Acute delirium superimposed on progressive chronic dementia precipitated by recent death in the family. Psychiatry consulted recommended medical therapy.  He was initially started on Tegretol but was discontinued. Initial CT of the head is negative for acute findings.  MRI could not be done as patient was not cooperative with the study and it is unlikely that MRI will change the treatment strategy.  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  His lab work showed negative ethanol on admission,  ammonia level is normal, TSH normal, thiamine normal, cortisol levels appear to be normal, RPR is nonreactive.  Vitamin B12 levels have  been noted to be low and are being supplemented.   He was currently started on Seroquel 25 mg at bedtime, which has been increased to 50 mg,. Try to minimize haldol and ativan if possible.    Patient was also started on Depakote, to 50 mg during the day and 500 mg at bedtime. At this point patient is still not safe for discharge home or to rehab as he continues to require restraints for agitation and to prevent harm to the patient.  We were able to take the restraints off yesterday for a few hours and this afternoon for a few hours and let him walk in the hallway with the help of RN and NT's.      Chronic atrial fibrillation Rate control with metoprolol and on Eliquis for anticoagulation.  Recommend outpatient follow-up with cardiology regarding anticoagulation as patient has relative risk for falling due to confusion.    Prolonged QTC Keep potassium greater than 4 and magnesium greater than 2. Last qtc is 495. Recheck labs in am.     Type 2 diabetes mellitus  well controlled cbg's CBG (last 3)  Recent Labs    07/29/20 2129 07/30/20 0800 07/30/20 1128  GLUCAP 108* 119* 147*   Resume sliding scale insulin.  No changes in medications   Hypothyroidism Continue with home medications.   Vitamin D deficiency Replacement ordered.   Hypertension Blood pressure parameters are optimal   Vitamin B12 deficiency B12 levels came at 179 ,  supplementation started.   History of depression Continue with Cymbalta.  History of ulcerative colitis Continue with Imuran.  Goals of care Patient's mental status has improved when compared to yesterday.  Continue with the same regimen for now.  He continues to require restraints and sitter.  He is unable to take care of himself in his current state.  He will possibly need placement eventually as his wife is unable to take care of him.  Palliative care will be consulted for goals of care discussion. Meanwhile PT evaluation ordered.   Psychiatric reconsulted as patient remains agitated despite Seroquel, Depakote, Haldol and Ativan.   DVT prophylaxis: Eliquis Code Status: DNR Family Communication: None at bedside Disposition:   Status is: Inpatient  Remains inpatient appropriate because:Inpatient level of care appropriate due to severity of illness , patient still in wrist and ankle restraints,, and confused and restless and agitated requiring bedside sitter  Dispo:  Patient From: Home  Planned Disposition: [place]  Expected discharge date: 08/01/20  Medically stable for discharge: No        Consultants:   Psychiatry  Procedures: None Antimicrobials: None  Subjective: He is alert, reports skipping breakfast breakfast, we were able to take off the restraints for a few hours this afternoon  Objective: Vitals:   07/30/20 1008 07/30/20 1016 07/30/20 1018 07/30/20 1339  BP: (!) 145/79 (!) 145/79  (!) 129/111  Pulse: 99 (!) 48 89 (!) 56  Resp:    16  Temp:    97.9 F (36.6 C)  TempSrc:    Oral  SpO2:  94%  99%  Weight:      Height:       No intake or output data in the 24 hours ending 07/30/20 1558 Filed Weights   07/20/20 0922  Weight: 115.2 kg    Examination:  General exam: Patient is alert, does not appear to be in distress. Respiratory system: Clear to auscultation bilaterally, no wheezing Cardiovascular system: S1-S2 heard, irregularly irregular, no JVD, no pedal edema Gastrointestinal system: Abdomen is soft, nontender, bowel sounds normal Central nervous system: Alert, able to move all extremities but very confused Extremities: No pedal edema or cyanosis Skin: No ulcers seen Psychiatry: Not agitated   Data Reviewed: I have personally reviewed following labs and imaging studies  CBC: Recent Labs  Lab 07/27/20 0407  WBC 8.6  HGB 15.5  HCT 44.5  MCV 96.5  PLT 676    Basic Metabolic Panel: Recent Labs  Lab 07/24/20 0341 07/27/20 0407 07/29/20 0437   NA 134* 136 140  K 3.9 3.6 3.7  CL 98 100 100  CO2 26 26 28   GLUCOSE 111* 116* 113*  BUN 13 16 16   CREATININE 1.04 0.99 1.05  CALCIUM 9.3 9.3 9.5  MG 2.0  --  2.2    GFR: Estimated Creatinine Clearance: 78.2 mL/min (by C-G formula based on SCr of 1.05 mg/dL).  Liver Function Tests: Recent Labs  Lab 07/27/20 0407  AST 44*  ALT 49*  ALKPHOS 52  BILITOT 1.9*  PROT 6.7  ALBUMIN 3.9    CBG: Recent Labs  Lab 07/29/20 1143 07/29/20 1725 07/29/20 2129 07/30/20 0800 07/30/20 1128  GLUCAP 182* 112* 108* 119* 147*     No results found for this or any previous visit (from the past 240 hour(s)).       Radiology Studies: DG CHEST PORT 1 VIEW  Result Date: 07/30/2020 CLINICAL DATA:  Cough, atrial fibrillation, dementia EXAM: PORTABLE CHEST 1 VIEW COMPARISON:  07/13/2020 FINDINGS: Low lung volumes noted with similar cardiomegaly. Slight  vascular crowding and basilar atelectasis. No significant airspace process, collapse or consolidation. No large effusion or pneumothorax. Trachea midline. Aorta atherosclerotic. IMPRESSION: Cardiomegaly with low lung volumes.  No significant interval change. Aortic Atherosclerosis (ICD10-I70.0). Electronically Signed   By: Jerilynn Mages.  Shick M.D.   On: 07/30/2020 14:19        Scheduled Meds: . amLODipine  5 mg Oral Daily  . apixaban  5 mg Oral BID  . atorvastatin  20 mg Oral Daily  . azaTHIOprine  200 mg Oral Daily  . divalproex  250 mg Oral Daily  . divalproex  500 mg Oral QHS  . DULoxetine  60 mg Oral Daily  . insulin aspart  0-9 Units Subcutaneous TID WC  . levothyroxine  25 mcg Oral Q0600  . metoprolol tartrate  50 mg Oral BID  . QUEtiapine  50 mg Oral QHS  . vitamin B-12  1,000 mcg Oral Daily  . Vitamin D (Ergocalciferol)  50,000 Units Oral Q7 days   Continuous Infusions:   LOS: 16 days       Hosie Poisson, MD [facility]   To contact the attending provider between 7A-7P or the covering provider during after hours  7P-7A, please log into the web site [email or web address] and access using universal [place] password for that web site. If you do not have the password, please call the hospital operator.  07/30/2020, 3:58 PM

## 2020-07-31 DIAGNOSIS — R9431 Abnormal electrocardiogram [ECG] [EKG]: Secondary | ICD-10-CM | POA: Diagnosis not present

## 2020-07-31 DIAGNOSIS — R410 Disorientation, unspecified: Secondary | ICD-10-CM | POA: Diagnosis not present

## 2020-07-31 LAB — BASIC METABOLIC PANEL
Anion gap: 14 (ref 5–15)
BUN: 20 mg/dL (ref 8–23)
CO2: 26 mmol/L (ref 22–32)
Calcium: 9.6 mg/dL (ref 8.9–10.3)
Chloride: 104 mmol/L (ref 98–111)
Creatinine, Ser: 1.15 mg/dL (ref 0.61–1.24)
GFR, Estimated: >60 mL/min (ref >60)
Glucose, Bld: 121 mg/dL — ABNORMAL HIGH (ref 70–99)
Potassium: 4.2 mmol/L (ref 3.5–5.1)
Sodium: 144 mmol/L (ref 135–145)

## 2020-07-31 LAB — GLUCOSE, CAPILLARY
Glucose-Capillary: 106 mg/dL — ABNORMAL HIGH (ref 70–99)
Glucose-Capillary: 172 mg/dL — ABNORMAL HIGH (ref 70–99)
Glucose-Capillary: 112 mg/dL — ABNORMAL HIGH (ref 70–99)
Glucose-Capillary: 105 mg/dL — ABNORMAL HIGH (ref 70–99)

## 2020-07-31 LAB — CBC
HCT: 48 % (ref 39.0–52.0)
Hemoglobin: 16 g/dL (ref 13.0–17.0)
MCH: 33.8 pg (ref 26.0–34.0)
MCHC: 33.3 g/dL (ref 30.0–36.0)
MCV: 101.3 fL — ABNORMAL HIGH (ref 80.0–100.0)
Platelets: 221 10*3/uL (ref 150–400)
RBC: 4.74 MIL/uL (ref 4.22–5.81)
RDW: 14.9 % (ref 11.5–15.5)
WBC: 11.3 10*3/uL — ABNORMAL HIGH (ref 4.0–10.5)
nRBC: 0 % (ref 0.0–0.2)

## 2020-07-31 MED ORDER — DIPHENHYDRAMINE HCL 12.5 MG/5ML PO ELIX
6.2500 mg | ORAL_SOLUTION | Freq: Three times a day (TID) | ORAL | Status: DC | PRN
  Administered 2020-07-31 – 2020-09-02 (×7): 6.25 mg via ORAL
  Filled 2020-07-31 (×7): qty 5

## 2020-07-31 MED ORDER — MELATONIN 3 MG PO TABS
3.0000 mg | ORAL_TABLET | Freq: Every day | ORAL | Status: DC
  Administered 2020-07-31 – 2020-09-08 (×40): 3 mg via ORAL
  Filled 2020-07-31 (×40): qty 1

## 2020-07-31 MED ORDER — LORAZEPAM 0.5 MG PO TABS
0.5000 mg | ORAL_TABLET | Freq: Three times a day (TID) | ORAL | Status: DC
  Administered 2020-07-31 – 2020-08-05 (×17): 0.5 mg via ORAL
  Filled 2020-07-31 (×17): qty 1

## 2020-07-31 NOTE — Progress Notes (Signed)
PROGRESS NOTE    [name] MILKS Sr.  YPP:[id number] DOB: 22-Jun-1951 DOA: 07/13/2020 PCP: [name], DO    Chief Complaint  Patient presents with  . increase confusion    Brief Narrative: 69 year old gentleman prior history of atrial fibrillation on anticoagulation, dementia and depression brought to ED for increasing agitation.  As per the family he has been progressively having worsening dementia.  Multiple medications have been attempted in the outpatient setting without any improvement.  His initial CT of the head on admission was negative for acute findings without any evidence of acute infarction.  Patient is currently on Depakote ER to 50 mg during the day and 500 at bedtime along with Seroquel 50 mg at bedtime and ativan.  We were able to take him off restraints since 8 am this morning.  He has walked with the help of staff in the hallway.  He remains confused and is not safe for discharge home. He will require 24 hours supervision and assistance.   Assessment & Plan:   Active Problems:   AMS (altered mental status)   Acute delirium   Acute delirium superimposed on progressive chronic dementia precipitated by recent death in the family. Psychiatry consulted recommended medical therapy.  He was initially started on Tegretol but was discontinued. Initial CT of the head is negative for acute findings.  MRI could not be done as patient was not cooperative with the study and it is unlikely that MRI will change the treatment strategy.   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  His lab work showed negative ethanol on admission,  ammonia level is normal, TSH normal, thiamine normal, cortisol levels appear to be normal, RPR is nonreactive.  Vitamin B12 levels have been noted to be low and are being supplemented.   He was started on Seroquel 25 mg at bedtime, which has been increased to 50 mg,.    Patient was also started on Depakote, to 50 mg during  the day and 500 mg at bedtime.  We were able to take him off restraints since 8 am this morning.  He has walked with the help of staff in the hallway.  He remains confused and is not safe for discharge home. He will require 24 hours supervision and assistance.      Chronic atrial fibrillation Rate control with metoprolol and on Eliquis for anticoagulation.  Recommend outpatient follow-up with cardiology regarding anticoagulation as patient has relative risk for falling due to confusion.    Prolonged QTC Keep potassium greater than 4 and magnesium greater than 2. Last qtc is 495.     Type 2 diabetes mellitus  well controlled cbg's CBG (last 3)  Recent Labs    07/30/20 2219 07/31/20 0828 07/31/20 1156  GLUCAP 110* 106* 172*   Resume sliding scale insulin.  No changes in medications   Hypothyroidism Continue with home medications.   Vitamin D deficiency Replacement ordered.   Hypertension Blood pressure parameters are optimal   Vitamin B12 deficiency B12 levels came at 179 ,  supplementation started.   History of depression Continue with Cymbalta.    History of ulcerative colitis Continue with Imuran.  Goals of care Patient's mental status has improved when compared to yesterday.  Continue with the same regimen for now. He is unable to take care of himself in his current state.  He will possibly need placement eventually as his wife is unable to take care of him.   Meanwhile PT evaluation  ordered.     DVT prophylaxis: Eliquis Code Status: DNR Family Communication: None at bedside, discussed with wife yesterday.  Disposition:   Status is: Inpatient  Remains inpatient appropriate because:Inpatient level of care appropriate due to severity of illness , pt confused will need placement as wife is unable to care for him.   Dispo:  Patient From: Home  Planned Disposition: [place]  Expected discharge date: 08/01/20  Medically stable for  discharge: No        Consultants:   Psychiatry  Procedures: None Antimicrobials: None  Subjective: No new complaints.  Calm,but confused.   Objective: Vitals:   07/30/20 1339 07/30/20 2223 07/31/20 0511 07/31/20 1110  BP: (!) 129/111 123/75 130/88 (!) 148/74  Pulse: (!) 56 84 63 71  Resp: 16 19 20    Temp: 97.9 F (36.6 C) 98.3 F (36.8 C) 98.1 F (36.7 C)   TempSrc: Oral     SpO2: 99% 98% 98%   Weight:      Height:        Intake/Output Summary (Last 24 hours) at 07/31/2020 1406 Last data filed at 07/31/2020 1025 Gross per 24 hour  Intake 670 ml  Output 400 ml  Net 270 ml   Filed Weights   07/20/20 0922  Weight: 115.2 kg    Examination:  General exam: Alert, does not appear to be in distress.  Respiratory system: clear to auscultation, no wheezing or rhonchi.  Cardiovascular system: S1S2 Heard, irregularly irregular, no JVD,no pedal edema.  Gastrointestinal system: Abdomen is soft , non tender nondistended, bowel sounds wnl. Central nervous system: alert and not oriented.  Extremities: No pedal edema.  Skin: No ulcers seen Psychiatry: Not agitated   Data Reviewed: I have personally reviewed following labs and imaging studies  CBC: Recent Labs  Lab 07/27/20 0407 07/31/20 0338  WBC 8.6 11.3*  HGB 15.5 16.0  HCT 44.5 48.0  MCV 96.5 101.3*  PLT 237 767    Basic Metabolic Panel: Recent Labs  Lab 07/27/20 0407 07/29/20 0437 07/31/20 0338  NA 136 140 144  K 3.6 3.7 4.2  CL 100 100 104  CO2 26 28 26   GLUCOSE 116* 113* 121*  BUN 16 16 20   CREATININE 0.99 1.05 1.15  CALCIUM 9.3 9.5 9.6  MG  --  2.2  --     GFR: Estimated Creatinine Clearance: 71.4 mL/min (by C-G formula based on SCr of 1.15 mg/dL).  Liver Function Tests: Recent Labs  Lab 07/27/20 0407  AST 44*  ALT 49*  ALKPHOS 52  BILITOT 1.9*  PROT 6.7  ALBUMIN 3.9    CBG: Recent Labs  Lab 07/30/20 1128 07/30/20 1727 07/30/20 2219 07/31/20 0828 07/31/20 1156    GLUCAP 147* 120* 110* 106* 172*     No results found for this or any previous visit (from the past 240 hour(s)).       Radiology Studies: DG CHEST PORT 1 VIEW  Result Date: 07/30/2020 CLINICAL DATA:  Cough, atrial fibrillation, dementia EXAM: PORTABLE CHEST 1 VIEW COMPARISON:  07/13/2020 FINDINGS: Low lung volumes noted with similar cardiomegaly. Slight vascular crowding and basilar atelectasis. No significant airspace process, collapse or consolidation. No large effusion or pneumothorax. Trachea midline. Aorta atherosclerotic. IMPRESSION: Cardiomegaly with low lung volumes.  No significant interval change. Aortic Atherosclerosis (ICD10-I70.0). Electronically Signed   By: Jerilynn Mages.  Shick M.D.   On: 07/30/2020 14:19        Scheduled Meds: . amLODipine  5 mg Oral Daily  . apixaban  5 mg Oral BID  . atorvastatin  20 mg Oral Daily  . azaTHIOprine  200 mg Oral Daily  . divalproex  250 mg Oral Daily  . divalproex  500 mg Oral QHS  . DULoxetine  60 mg Oral Daily  . insulin aspart  0-9 Units Subcutaneous TID WC  . levothyroxine  25 mcg Oral Q0600  . LORazepam  0.5 mg Oral TID  . metoprolol tartrate  50 mg Oral BID  . QUEtiapine  50 mg Oral QHS  . vitamin B-12  1,000 mcg Oral Daily  . Vitamin D (Ergocalciferol)  50,000 Units Oral Q7 days   Continuous Infusions:   LOS: 17 days       Hosie Poisson, MD [facility]   To contact the attending provider between 7A-7P or the covering provider during after hours 7P-7A, please log into the web site [email or web address] and access using universal [place] password for that web site. If you do not have the password, please call the hospital operator.  07/31/2020, 2:06 PM

## 2020-07-31 NOTE — Progress Notes (Signed)
PT Cancellation Note  Patient Details Name: [name] JOHNSTON Sr. MRN: [id number] DOB: [birth year]   Cancelled Treatment:    Reason Eval/Treat Not Completed: Medical issues which prohibited therapy. Spoke with nursing who reports pt had to be placed back in restraints due to agitation. She states pt does better in AM.  Nursing staff was able to walk earlier and report he was very unsteady.  PT will attempt to come in the AM tomorrow to complete evaluation.   Galen Manila 07/31/2020, 3:50 PM

## 2020-08-01 ENCOUNTER — Ambulatory Visit: Payer: Medicare HMO | Admitting: Family Medicine

## 2020-08-01 DIAGNOSIS — R410 Disorientation, unspecified: Secondary | ICD-10-CM | POA: Diagnosis not present

## 2020-08-01 DIAGNOSIS — R9431 Abnormal electrocardiogram [ECG] [EKG]: Secondary | ICD-10-CM | POA: Diagnosis not present

## 2020-08-01 LAB — GLUCOSE, CAPILLARY
Glucose-Capillary: 141 mg/dL — ABNORMAL HIGH (ref 70–99)
Glucose-Capillary: 122 mg/dL — ABNORMAL HIGH (ref 70–99)
Glucose-Capillary: 129 mg/dL — ABNORMAL HIGH (ref 70–99)
Glucose-Capillary: 117 mg/dL — ABNORMAL HIGH (ref 70–99)

## 2020-08-01 MED ORDER — MIRTAZAPINE 15 MG PO TABS: 7.5000 mg | ORAL_TABLET | Freq: Every day | ORAL | Status: DC

## 2020-08-01 NOTE — Progress Notes (Signed)
PROGRESS NOTE    [name] BRADISH Sr.  VOH:[id number] DOB: Feb 04, 1951 DOA: 07/13/2020 PCP: [name], DO    Chief Complaint  Patient presents with  . increase confusion    Brief Narrative: 69 year old gentleman prior history of atrial fibrillation on anticoagulation, dementia and depression brought to ED for increasing agitation.  As per the family he has been progressively having worsening dementia.  Multiple medications have been attempted in the outpatient setting without any improvement.  His initial CT of the head on admission was negative for acute findings without any evidence of acute infarction.  Patient is currently on Depakote ER to 50 mg during the day and 500 at bedtime along with Seroquel 50 mg at bedtime and ativan. Overnight pt became agitated and had to be restrained. This afternoon, ankle restraints were taken off.  He remains confused and is not safe for discharge home. He will require 24 hours supervision and assistance. Discussed the plan with wife at bedside.    Assessment & Plan:   Active Problems:   AMS (altered mental status)   Acute delirium   Acute delirium superimposed on progressive chronic dementia precipitated by recent death in the family. Psychiatry consulted recommended medical therapy.  He was initially started on Tegretol but was discontinued. Initial CT of the head is negative for acute findings.  MRI could not be done as patient was not cooperative with the study and it is unlikely that MRI will change the treatment strategy.   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  His lab work showed negative ethanol on admission,  ammonia level is normal, TSH normal, thiamine normal, cortisol levels appear to be normal, RPR is nonreactive.  Vitamin B12 levels have been noted to be low and are being supplemented.   He was started on Seroquel 25 mg at bedtime, which has been increased to 50 mg,.    Patient was also started on  Depakote, to 50 mg during the day and 500 mg at bedtime.  Overnight pt slept, but became agitated earlier this am and had to be restrained.  He remains confused and is not safe for discharge home. He will require 24 hours supervision and assistance.      Chronic atrial fibrillation Rate control with metoprolol and on Eliquis for anticoagulation.  Recommend outpatient follow-up with cardiology regarding anticoagulation as patient has relative risk for falling due to confusion.    Prolonged QTC Keep potassium greater than 4 and magnesium greater than 2. Last qtc is 495.  Last potassium is 4.2, will check magnesium levels in am.    Type 2 diabetes mellitus  well controlled cbg's CBG (last 3)  Recent Labs    07/31/20 2203 08/01/20 0802 08/01/20 1122  GLUCAP 105* 141* 122*   Resume sliding scale insulin.  No changes in medications   Hypothyroidism Continue with home medications.   Vitamin D deficiency Replacement ordered.   Hypertension BP parameters are optimal.    Vitamin B12 deficiency B12 levels came at 179 ,  supplementation started.   History of depression Continue with Cymbalta.    History of ulcerative colitis Continue with Imuran.  Goals of care Patient's mental status has improved when compared to yesterday.  Continue with the same regimen for now. He is unable to take care of himself in his current state.  He will possibly need placement eventually as his wife is unable to take care of him.   Meanwhile PT evaluation ordered. He is  unable to participate in PT as he in restraints.     DVT prophylaxis: Eliquis Code Status: DNR Family Communication: discussed with wife at bedside.  Disposition:   Status is: Inpatient  Remains inpatient appropriate because:Inpatient level of care appropriate due to severity of illness , pt confused will need placement as wife is unable to care for him.   Dispo:  Patient From: Home  Planned Disposition: [place]  Expected discharge date: 08/05/20  Medically stable for discharge: No        Consultants:   Psychiatry  Procedures: None Antimicrobials: None  Subjective: In restraints and sedated.   Objective: Vitals:   07/31/20 1110 07/31/20 1534 07/31/20 2047 08/01/20 0555  BP: (!) 148/74 140/69 107/80 (!) 121/56  Pulse: 71 78 68 83  Resp:   20 19  Temp:  98.4 F (36.9 C) 98.6 F (37 C) 98 F (36.7 C)  TempSrc:  Oral Oral Oral  SpO2:  96% 100% 100%  Weight:      Height:        Intake/Output Summary (Last 24 hours) at 08/01/2020 1626 Last data filed at 08/01/2020 1514 Gross per 24 hour  Intake 1074 ml  Output 200 ml  Net 874 ml   Filed Weights   07/20/20 0922  Weight: 115.2 kg    Examination:  General exam: Alert, does not appear to be in distress.  Respiratory system: clear to auscultation, no wheezing or rhonchi.  Cardiovascular system: S1S2 Heard, irregularly irregular, no JVD,no pedal edema.  Gastrointestinal system: Abdomen is soft , non tender nondistended, bowel sounds wnl. Central nervous system: alert and not oriented.  Extremities: No pedal edema.  Skin: No ulcers seen Psychiatry: Not agitated   Data Reviewed: I have personally reviewed following labs and imaging studies  CBC: Recent Labs  Lab 07/27/20 0407 07/31/20 0338  WBC 8.6 11.3*  HGB 15.5 16.0  HCT 44.5 48.0  MCV 96.5 101.3*  PLT 237 326    Basic Metabolic Panel: Recent Labs  Lab 07/27/20 0407 07/29/20 0437 07/31/20 0338  NA 136 140 144  K 3.6 3.7 4.2  CL 100 100 104  CO2 26 28 26   GLUCOSE 116* 113* 121*  BUN 16 16 20   CREATININE 0.99 1.05 1.15  CALCIUM 9.3 9.5 9.6  MG  --  2.2  --     GFR: Estimated Creatinine Clearance: 71.4 mL/min (by C-G formula based on SCr of 1.15 mg/dL).  Liver Function Tests: Recent Labs  Lab 07/27/20 0407  AST 44*  ALT 49*  ALKPHOS 52  BILITOT 1.9*  PROT 6.7  ALBUMIN 3.9    CBG: Recent Labs  Lab 07/31/20 1156  07/31/20 1639 07/31/20 2203 08/01/20 0802 08/01/20 1122  GLUCAP 172* 112* 105* 141* 122*     No results found for this or any previous visit (from the past 240 hour(s)).       Radiology Studies: No results found.      Scheduled Meds: . amLODipine  5 mg Oral Daily  . apixaban  5 mg Oral BID  . atorvastatin  20 mg Oral Daily  . azaTHIOprine  200 mg Oral Daily  . divalproex  250 mg Oral Daily  . divalproex  500 mg Oral QHS  . DULoxetine  60 mg Oral Daily  . insulin aspart  0-9 Units Subcutaneous TID WC  . levothyroxine  25 mcg Oral Q0600  . LORazepam  0.5 mg Oral TID  . melatonin  3 mg Oral QHS  .  metoprolol tartrate  50 mg Oral BID  . mirtazapine  7.5 mg Oral QHS  . QUEtiapine  50 mg Oral QHS  . vitamin B-12  1,000 mcg Oral Daily  . Vitamin D (Ergocalciferol)  50,000 Units Oral Q7 days   Continuous Infusions:   LOS: 18 days       Hosie Poisson, MD [facility]   To contact the attending provider between 7A-7P or the covering provider during after hours 7P-7A, please log into the web site [email or web address] and access using universal [place] password for that web site. If you do not have the password, please call the hospital operator.  08/01/2020, 4:26 PM

## 2020-08-01 NOTE — Progress Notes (Signed)
PT Cancellation Note  Patient Details Name: [name] PEACOCK Sr. MRN: [id number] DOB: 26-Sep-1950   Cancelled Treatment:    Reason Eval/Treat Not Completed: Patient's level of consciousness, in 4 safety restraints, attempting to get OOB. Will check back another time.    Dayan, Desa 08/01/2020, 11:24 AM [place] Pager [phone] Office [phone]

## 2020-08-02 ENCOUNTER — Other Ambulatory Visit: Payer: Self-pay | Admitting: Family Medicine

## 2020-08-02 DIAGNOSIS — R9431 Abnormal electrocardiogram [ECG] [EKG]: Secondary | ICD-10-CM | POA: Diagnosis not present

## 2020-08-02 DIAGNOSIS — R410 Disorientation, unspecified: Secondary | ICD-10-CM | POA: Diagnosis not present

## 2020-08-02 LAB — GLUCOSE, CAPILLARY
Glucose-Capillary: 112 mg/dL — ABNORMAL HIGH (ref 70–99)
Glucose-Capillary: 130 mg/dL — ABNORMAL HIGH (ref 70–99)
Glucose-Capillary: 137 mg/dL — ABNORMAL HIGH (ref 70–99)
Glucose-Capillary: 109 mg/dL — ABNORMAL HIGH (ref 70–99)

## 2020-08-02 LAB — MAGNESIUM: Magnesium: 2 mg/dL (ref 1.7–2.4)

## 2020-08-02 MED ORDER — OLANZAPINE 5 MG PO TABS
5.0000 mg | ORAL_TABLET | Freq: Every day | ORAL | Status: DC
  Administered 2020-08-02: 5 mg via ORAL
  Filled 2020-08-02: qty 1

## 2020-08-02 MED ORDER — ENSURE ENLIVE PO LIQD
237.0000 mL | Freq: Two times a day (BID) | ORAL | Status: DC
  Administered 2020-08-02 – 2020-08-19 (×25): 237 mL via ORAL

## 2020-08-02 NOTE — Progress Notes (Signed)
PT Cancellation Note  Patient Details Name: [name] HANNIS Sr. MRN: [id number] DOB: [birth year]   Cancelled Treatment:    Reason Eval/Treat Not Completed: PT screened, no needs identified, will sign off, have checked on patient  X 2 days. Remains in safety restraints, very restless. Unsafe to attempt PT . Noted Nursing has ambulated patient.    Sabastien, Tyler 08/02/2020, 1:13 PM [place] Pager [phone] Office [phone]

## 2020-08-02 NOTE — Progress Notes (Signed)
PROGRESS NOTE    [name] STRANAHAN Sr.  GYJ:[id number] DOB: May 30, 1951 DOA: 07/13/2020 PCP: [name], DO    Chief Complaint  Patient presents with  . increase confusion    Brief Narrative: 68 year old gentleman prior history of atrial fibrillation on anticoagulation, dementia and depression brought to ED for increasing agitation.  As per the family he has been progressively having worsening dementia.  Multiple medications have been attempted in the outpatient setting without any improvement.  His initial CT of the head on admission was negative for acute findings without any evidence of acute infarction.  Patient is currently on Depakote ER to 250 mg during the day and 500 at bedtime along with Seroquel 50 mg daily. No Improvement with Seroquel, which was discontinued and changed to Zyprexa 5 mg at bedtime. He is not stable for discharge as he either agitated and is requiring 4 point restraints or sedated from the mediations.  He will require 24 hours supervision and assistance. Discussed the plan with family at bedside.  Wife is in the process of initiating medicaid for him and is looking for long term residence.  PT Evaluation ordered but they couldn't work with him as he has been in restraints.  Over the weekend he has had periods where he was off restraints and walked with the nursing staff in the hallways for a few hours.    Pt seen and examined, he is sedated, only ate half of banana and ensure for lunch remains in restraints, grand daughter at bedside.  .    Assessment & Plan:   Active Problems:   Major depression, recurrent, chronic (HCC)   Essential hypertension   GAD (generalized anxiety disorder)   Dementia with behavioral disturbance (HCC)   Atrial fibrillation with rapid ventricular response (HCC)   Hypokalemia   AMS (altered mental status)   Acute delirium   Acute delirium superimposed on progressive chronic dementia precipitated by recent death in the  family. Psychiatry consulted recommended medical therapy.  He was initially started on Tegretol but was discontinued. Initial CT of the head is negative for acute findings.  MRI could not be done as patient was not cooperative with the study and it is unlikely that MRI will change the treatment strategy.   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  His lab work showed negative ethanol on admission,  ammonia level is normal, TSH normal, thiamine normal, cortisol levels appear to be normal, RPR is nonreactive.  Vitamin B12 levels have been noted to be low and are being supplemented.   He was started on Seroquel 25 mg at bedtime, which has been increased to 50 mg, has been o it for almost one week, without much improvement, will d/c seroquel and start him on zyprexa 5 mg at bedtime.  Another request for psychiatry placed.    Patient was also started on Depakote 250 mg during the day and 500 mg at bedtime.       Chronic atrial fibrillation Rate control with metoprolol and on Eliquis for anticoagulation.  Recommend outpatient follow-up with cardiology regarding anticoagulation as patient has relative risk for falling due to confusion.    Prolonged QTC Keep potassium greater than 4 and magnesium greater than 2. Last qtc is 495.     Type 2 diabetes mellitus  well controlled cbg's CBG (last 3)  Recent Labs    08/01/20 2058 08/02/20 0810 08/02/20 1139  GLUCAP 117* 112* 130*   Resume sliding scale insulin.  No changes in medications   Hypothyroidism Continue with home medications.    Vitamin D deficiency Replacement ordered.   Hypertension BP parameters are optimal.    Vitamin B12 deficiency B12 levels came at 179 ,  supplementation started.   History of depression Continue with Cymbalta.    History of ulcerative colitis Continue with Imuran.  Goals of care Patient's mental status has improved when compared to yesterday.  Continue with  the same regimen for now. He is unable to take care of himself in his current state.  He will possibly need placement eventually as his wife is unable to take care of him.   Meanwhile PT evaluation ordered. He is unable to participate in PT as he in restraints.     DVT prophylaxis: Eliquis Code Status: DNR Family Communication: discussed with grand daughter at bedside.  Disposition:   Status is: Inpatient  Remains inpatient appropriate because:Inpatient level of care appropriate due to severity of illness , pt confused will need placement as wife is unable to care for him.   Dispo:  Patient From: Home  Planned Disposition: [place]  Expected discharge date: 08/05/20  Medically stable for discharge: No        Consultants:   Psychiatry  Procedures: None Antimicrobials: None  Subjective: In restraints and sedated.   Objective: Vitals:   08/01/20 2124 08/01/20 2127 08/02/20 0548 08/02/20 1400  BP: 103/80 103/80 (!) 133/54 140/84  Pulse: 71 71 63 96  Resp: 20 20 18 20   Temp: 98 F (36.7 C) 98 F (36.7 C) 98 F (36.7 C) 99.1 F (37.3 C)  TempSrc:  Oral Oral Axillary  SpO2: 94% 94% 93% 93%  Weight:      Height:        Intake/Output Summary (Last 24 hours) at 08/02/2020 1601 Last data filed at 08/02/2020 1220 Gross per 24 hour  Intake 60 ml  Output 100 ml  Net -40 ml   Filed Weights   07/20/20 0922  Weight: 115.2 kg    Examination:  General exam: sleeping comfortably, not in distress.  Respiratory system: clear to auscultation, no wheezing or rhonchi.  Cardiovascular system: S1s2 irregularly irregular, no JVD, no pedal edema.  Gastrointestinal system: Abdomen is soft, NT ND BS+ Central nervous system: Sleeping comfortably.  Extremities: no pedal edema. No cyanosis.  Skin: No rashes seen.  Psychiatry: Not agitated   Data Reviewed: I have personally reviewed following labs and imaging studies  CBC: Recent Labs  Lab 07/27/20 0407  07/31/20 0338  WBC 8.6 11.3*  HGB 15.5 16.0  HCT 44.5 48.0  MCV 96.5 101.3*  PLT 237 353    Basic Metabolic Panel: Recent Labs  Lab 07/27/20 0407 07/29/20 0437 07/31/20 0338 08/02/20 0354  NA 136 140 144  --   K 3.6 3.7 4.2  --   CL 100 100 104  --   CO2 26 28 26   --   GLUCOSE 116* 113* 121*  --   BUN 16 16 20   --   CREATININE 0.99 1.05 1.15  --   CALCIUM 9.3 9.5 9.6  --   MG  --  2.2  --  2.0    GFR: Estimated Creatinine Clearance: 71.4 mL/min (by C-G formula based on SCr of 1.15 mg/dL).  Liver Function Tests: Recent Labs  Lab 07/27/20 0407  AST 44*  ALT 49*  ALKPHOS 52  BILITOT 1.9*  PROT 6.7  ALBUMIN 3.9    CBG: Recent Labs  Lab 08/01/20  1122 08/01/20 1723 08/01/20 2058 08/02/20 0810 08/02/20 1139  GLUCAP 122* 129* 117* 112* 130*     No results found for this or any previous visit (from the past 240 hour(s)).       Radiology Studies: No results found.      Scheduled Meds: . amLODipine  5 mg Oral Daily  . apixaban  5 mg Oral BID  . atorvastatin  20 mg Oral Daily  . azaTHIOprine  200 mg Oral Daily  . divalproex  250 mg Oral Daily  . divalproex  500 mg Oral QHS  . DULoxetine  60 mg Oral Daily  . feeding supplement  237 mL Oral BID BM  . insulin aspart  0-9 Units Subcutaneous TID WC  . levothyroxine  25 mcg Oral Q0600  . LORazepam  0.5 mg Oral TID  . melatonin  3 mg Oral QHS  . metoprolol tartrate  50 mg Oral BID  . OLANZapine  5 mg Oral QHS  . vitamin B-12  1,000 mcg Oral Daily  . Vitamin D (Ergocalciferol)  50,000 Units Oral Q7 days   Continuous Infusions:   LOS: 19 days       Hosie Poisson, MD [facility]   To contact the attending provider between 7A-7P or the covering provider during after hours 7P-7A, please log into the web site [email or web address] and access using universal [place] password for that web site. If you do not have the password, please call the hospital operator.  08/02/2020, 4:01 PM

## 2020-08-03 DIAGNOSIS — F411 Generalized anxiety disorder: Secondary | ICD-10-CM

## 2020-08-03 DIAGNOSIS — R9431 Abnormal electrocardiogram [ECG] [EKG]: Secondary | ICD-10-CM | POA: Diagnosis not present

## 2020-08-03 DIAGNOSIS — E538 Deficiency of other specified B group vitamins: Secondary | ICD-10-CM

## 2020-08-03 DIAGNOSIS — E876 Hypokalemia: Secondary | ICD-10-CM | POA: Diagnosis not present

## 2020-08-03 DIAGNOSIS — R410 Disorientation, unspecified: Secondary | ICD-10-CM | POA: Diagnosis not present

## 2020-08-03 DIAGNOSIS — E039 Hypothyroidism, unspecified: Secondary | ICD-10-CM | POA: Diagnosis not present

## 2020-08-03 DIAGNOSIS — F339 Major depressive disorder, recurrent, unspecified: Secondary | ICD-10-CM | POA: Diagnosis not present

## 2020-08-03 DIAGNOSIS — F0391 Unspecified dementia with behavioral disturbance: Secondary | ICD-10-CM | POA: Diagnosis not present

## 2020-08-03 DIAGNOSIS — I10 Essential (primary) hypertension: Secondary | ICD-10-CM | POA: Diagnosis not present

## 2020-08-03 DIAGNOSIS — I4891 Unspecified atrial fibrillation: Secondary | ICD-10-CM | POA: Diagnosis not present

## 2020-08-03 LAB — BASIC METABOLIC PANEL
Anion gap: 11 (ref 5–15)
BUN: 15 mg/dL (ref 8–23)
CO2: 30 mmol/L (ref 22–32)
Calcium: 9.1 mg/dL (ref 8.9–10.3)
Chloride: 98 mmol/L (ref 98–111)
Creatinine, Ser: 1.02 mg/dL (ref 0.61–1.24)
GFR, Estimated: >60 mL/min (ref >60)
Glucose, Bld: 106 mg/dL — ABNORMAL HIGH (ref 70–99)
Potassium: 3.4 mmol/L — ABNORMAL LOW (ref 3.5–5.1)
Sodium: 139 mmol/L (ref 135–145)

## 2020-08-03 LAB — GLUCOSE, CAPILLARY
Glucose-Capillary: 104 mg/dL — ABNORMAL HIGH (ref 70–99)
Glucose-Capillary: 117 mg/dL — ABNORMAL HIGH (ref 70–99)
Glucose-Capillary: 126 mg/dL — ABNORMAL HIGH (ref 70–99)
Glucose-Capillary: 113 mg/dL — ABNORMAL HIGH (ref 70–99)

## 2020-08-03 LAB — CBC
HCT: 45.6 % (ref 39.0–52.0)
Hemoglobin: 15.2 g/dL (ref 13.0–17.0)
MCH: 33.6 pg (ref 26.0–34.0)
MCHC: 33.3 g/dL (ref 30.0–36.0)
MCV: 100.9 fL — ABNORMAL HIGH (ref 80.0–100.0)
Platelets: 182 10*3/uL (ref 150–400)
RBC: 4.52 MIL/uL (ref 4.22–5.81)
RDW: 15.2 % (ref 11.5–15.5)
WBC: 7.8 10*3/uL (ref 4.0–10.5)
nRBC: 0 % (ref 0.0–0.2)

## 2020-08-03 LAB — TSH: TSH: 3.615 u[IU]/mL (ref 0.350–4.500)

## 2020-08-03 LAB — VALPROIC ACID LEVEL: Valproic Acid Lvl: 45 ug/mL — ABNORMAL LOW (ref 50.0–100.0)

## 2020-08-03 MED ORDER — POTASSIUM CHLORIDE CRYS ER 20 MEQ PO TBCR
40.0000 meq | EXTENDED_RELEASE_TABLET | Freq: Once | ORAL | Status: AC
  Administered 2020-08-03: 40 meq via ORAL
  Filled 2020-08-03: qty 2

## 2020-08-03 MED ORDER — DIVALPROEX SODIUM 125 MG PO CSDR
500.0000 mg | DELAYED_RELEASE_CAPSULE | Freq: Every day | ORAL | Status: DC
  Administered 2020-08-03 – 2020-08-31 (×29): 500 mg via ORAL
  Filled 2020-08-03 (×30): qty 4

## 2020-08-03 MED ORDER — OLANZAPINE 5 MG PO TABS
2.5000 mg | ORAL_TABLET | Freq: Two times a day (BID) | ORAL | Status: DC
  Administered 2020-08-03 – 2020-08-04 (×3): 2.5 mg via ORAL
  Filled 2020-08-03 (×3): qty 1

## 2020-08-03 NOTE — Progress Notes (Signed)
Pt status update:  Pts talking does not seem logical.  Pt rambling about many subjects that did not pertain to current situation.  Pt forcibly tried to exit the bed several times because he "needed to get to the barbecue."  Pt making inappropriate sexual comments to male nurse and nursing assistant.  RN tried to rediractable pt pt un-redirectable.    Pt urinated all over himself.  RN and nursing assistant attempted to change pts bedding and clothing but were unable to do so as pt was grabbing/yanking on staff.  swinging legs over bed and trying to get up.  RN called security to help change pts bed and clothing for the safety of the patient and staff.

## 2020-08-03 NOTE — Consult Note (Signed)
[facility] Consult   Reason for Consult: Persistent agitation Referring Physician: Dr. Grandville Silos Patient Identification: [name] Flesher Sr. MRN:  [id number] Principal Diagnosis: <principal problem not specified> Diagnosis:  Active Problems:   Major depression, recurrent, chronic (HCC)   Essential hypertension   GAD (generalized anxiety disorder)   Dementia with behavioral disturbance (HCC)   Atrial fibrillation with rapid ventricular response (HCC)   Hypokalemia   AMS (altered mental status)   Acute delirium   Total Time spent with patient: 30 minutes  Subjective:   [name] Flesher Sr. is a 69 y.o. male patient admitted with altered mental status.  Psych consult was placed due to persistent agitation.  Per chart review patient has history of dementia that has been worsening over the past 6 months.  His wife is his primary caretaker, and continues to be concerned about his safety and the safety of others in her home.  Patient with extended length of stay 20 days, who has shown some progression since his admission.  Extensive medical work-up has been performed with the exception of an MRI due to patient's inability to tolerate procedure.  However all other labs and diagnostic imaging have been determined to be within normal limits.  HPI:  [name] SANSOM Sr. is a 69 y.o. male with medical history significant of a fib, dementia, depression. Presents with increasing agitation over last week. Hx is per wife at bedside. She reports that [name] has had worsening dementia over the last 6 months. He has been following with his PCP about this issue. Different medications were tried -- including cymbalta, prozac, and abilify -- but nothing seems to have helped the decline. She reports that he has been found wandering the neighborhood at night with a flash light. He was disappeared to [place] without announcing he was going. His family has had to take away his keys so he can't drive  anywhere. He recently had a relative died, and it seems as if he decline has worsened. His wife is unable to take care of him at home at this point as she is also trying to take care of her mother with dementia. She is concerned about everyone's safety at this time, so she brought [name] to the ED.   On examination patient appeared to be resting, was also noted to be in four-point restraints with safety sitter at bedside. He is alert and oriented to self only. He reports today date is 1951-04-02. He does appear to be restless as he is tossing and turning throughout the bed and at one point asked "to cut the air down, because he was burning up. "  This nurse practitioner spoke to his safety sitter, who reports some improvement in his behavior over the weekend. "We were abele to get him out of restraints some, but then he bolted for the door and swung on staff."   She states she has decreased stimulation, and minimize disturbances, and he has responded appropriately. It is noted that she is playing some music as well.  EKG obtained 11/20, QTC of 495, down from 519.  Patient is currently resumed on his home medications duloxetine and mirtazapine. Unable to assess for suicidality.   Past Psychiatric History: Dementia, previously taking mirtazapine and cymbalta.   Risk to Self:  UTA Risk to Others:  UTA Prior Inpatient Therapy:  UTA Prior Outpatient Therapy:  UTA  Past Medical History:  Past Medical History:  Diagnosis Date  . Adenomatous colon polyp 2016  .  Allergy   . Anxiety   . Bell palsy    resolved  . Chicken pox   . Concussion with no loss of consciousness 07/07/2018  . Depression   . Diet-controlled diabetes mellitus ([place])   . Eating disorder   . GERD (gastroesophageal reflux disease)   . History of vitamin D deficiency   . Hyperlipidemia   . Hypertension   . Hypokalemia   . Memory loss   . MI (myocardial infarction) ([place])    per pt   . Ulcerative colitis ([place]) 2005-2006    severe     Past Surgical History:  Procedure Laterality Date  . COLONOSCOPY WITH PROPOFOL N/A 07/15/2014   Procedure: COLONOSCOPY WITH PROPOFOL;  Surgeon: [name], MD;  Location: [facility];  Service: Endoscopy;  Laterality: N/A;  . COLONOSCOPY WITH PROPOFOL N/A 08/22/2015   Procedure: COLONOSCOPY WITH PROPOFOL;  Surgeon: [name], MD;  Location: [facility];  Service: Endoscopy;  Laterality: N/A;  . ORIF ANKLE FRACTURE  02/27/2012   Procedure: OPEN REDUCTION INTERNAL FIXATION (ORIF) ANKLE FRACTURE;  Surgeon: Colin Rhein, MD;  Location: [facility];  Service: Orthopedics;  Laterality: Left;  ORIF left bimalleolar ankle fracture  . SHOULDER SURGERY     LEFT  . SIGMOIDOSCOPY  10/08/2018   Externa and Internal Hmorrhoids. Tubular and Tublovillous adenoma removed from transverse colon. Inflammatory pseudopolyps, negative for dysplasia  . TONSILLECTOMY    . UPPER GASTROINTESTINAL ENDOSCOPY     Family History:  Family History  Problem Relation Age of Onset  . Dementia Mother   . Hypertension Mother   . Early death Father 72       drowning   . Post-traumatic stress disorder Brother   . Neuropathy Brother    Family Psychiatric  History: UTA Social History:  Social History   Substance and Sexual Activity  Alcohol Use Yes   Comment: occ     Social History   Substance and Sexual Activity  Drug Use No    Social History   Socioeconomic History  . Marital status: Married    Spouse name: [name]  . Number of children: 2  . Years of education: Not on file  . Highest education level: Some college, no degree  Occupational History  . Not on file  Tobacco Use  . Smoking status: Former Smoker    Packs/day: 1.00    Years: 19.00    Pack years: 19.00    Quit date: 02/25/1982    Years since quitting: 38.4  . Smokeless tobacco: Former Systems developer    Quit date: Biochemist, clinical  . Vaping Use: Never used  Substance and Sexual Activity  . Alcohol use: Yes     Comment: occ  . Drug use: No  . Sexual activity: Yes    Partners: Female  Other Topics Concern  . Not on file  Social History Narrative   Lives at home with wife [name].   College-educated. Works in Public house manager.    Social Determinants of Health   Financial Resource Strain:   . Difficulty of Paying Living Expenses: Not on file  Food Insecurity:   . Worried About Charity fundraiser in the Last Year: Not on file  . Ran Out of Food in the Last Year: Not on file  Transportation Needs:   . Lack of Transportation (Medical): Not on file  . Lack of Transportation (Non-Medical): Not on file  Physical Activity:   . Days of  Exercise per Week: Not on file  . Minutes of Exercise per Session: Not on file  Stress:   . Feeling of Stress : Not on file  Social Connections:   . Frequency of Communication with Friends and Family: Not on file  . Frequency of Social Gatherings with Friends and Family: Not on file  . Attends Religious Services: Not on file  . Active Member of Clubs or Organizations: Not on file  . Attends Archivist Meetings: Not on file  . Marital Status: Not on file   Additional Social History:    Allergies:   Allergies  Allergen Reactions  . Losartan Potassium-Hctz Diarrhea  . Nsaids     On eliquis.   Marland Kitchen Zoloft [Sertraline Hcl] Diarrhea    Labs:  Results for orders placed or performed during the hospital encounter of 07/13/20 (from the past 48 hour(s))  Glucose, capillary     Status: Abnormal   Collection Time: 08/01/20 11:22 AM  Result Value Ref Range   Glucose-Capillary 122 (H) 70 - 99 mg/dL    Comment: Glucose reference range applies only to samples taken after fasting for at least 8 hours.  Glucose, capillary     Status: Abnormal   Collection Time: 08/01/20  5:23 PM  Result Value Ref Range   Glucose-Capillary 129 (H) 70 - 99 mg/dL    Comment: Glucose reference range applies only to samples taken after fasting for at least 8 hours.   Glucose, capillary     Status: Abnormal   Collection Time: 08/01/20  8:58 PM  Result Value Ref Range   Glucose-Capillary 117 (H) 70 - 99 mg/dL    Comment: Glucose reference range applies only to samples taken after fasting for at least 8 hours.  Magnesium     Status: None   Collection Time: 08/02/20  3:54 AM  Result Value Ref Range   Magnesium 2.0 1.7 - 2.4 mg/dL    Comment: Performed at [facility], [place] [address] [place], [place] [address]  Glucose, capillary     Status: Abnormal   Collection Time: 08/02/20  8:10 AM  Result Value Ref Range   Glucose-Capillary 112 (H) 70 - 99 mg/dL    Comment: Glucose reference range applies only to samples taken after fasting for at least 8 hours.  Glucose, capillary     Status: Abnormal   Collection Time: 08/02/20 11:39 AM  Result Value Ref Range   Glucose-Capillary 130 (H) 70 - 99 mg/dL    Comment: Glucose reference range applies only to samples taken after fasting for at least 8 hours.  Glucose, capillary     Status: Abnormal   Collection Time: 08/02/20  4:36 PM  Result Value Ref Range   Glucose-Capillary 137 (H) 70 - 99 mg/dL    Comment: Glucose reference range applies only to samples taken after fasting for at least 8 hours.  Glucose, capillary     Status: Abnormal   Collection Time: 08/02/20  8:46 PM  Result Value Ref Range   Glucose-Capillary 109 (H) 70 - 99 mg/dL    Comment: Glucose reference range applies only to samples taken after fasting for at least 8 hours.  TSH     Status: None   Collection Time: 08/03/20  4:23 AM  Result Value Ref Range   TSH 3.615 0.350 - 4.500 uIU/mL    Comment: Performed by a 3rd Generation assay with a functional sensitivity of <=0.01 uIU/mL. Performed at [facility], [place]  [address] [place], [place] [address]   Valproic acid level     Status: Abnormal   Collection Time: 08/03/20  4:23 AM  Result Value Ref Range   Valproic Acid Lvl 45 (L) 50.0 - 100.0 ug/mL     Comment: Performed at [facility], [place] [address] [place], [place] [address]  CBC     Status: Abnormal   Collection Time: 08/03/20  4:23 AM  Result Value Ref Range   WBC 7.8 4.0 - 10.5 K/uL   RBC 4.52 4.22 - 5.81 MIL/uL   Hemoglobin 15.2 13.0 - 17.0 g/dL   HCT 45.6 39 - 52 %   MCV 100.9 (H) 80.0 - 100.0 fL   MCH 33.6 26.0 - 34.0 pg   MCHC 33.3 30.0 - 36.0 g/dL   RDW 15.2 11.5 - 15.5 %   Platelets 182 150 - 400 K/uL   nRBC 0.0 0.0 - 0.2 %    Comment: Performed at [facility], [place] [address], [place], [place] [address]  Basic metabolic panel     Status: Abnormal   Collection Time: 08/03/20  4:23 AM  Result Value Ref Range   Sodium 139 135 - 145 mmol/L   Potassium 3.4 (L) 3.5 - 5.1 mmol/L   Chloride 98 98 - 111 mmol/L   CO2 30 22 - 32 mmol/L   Glucose, Bld 106 (H) 70 - 99 mg/dL    Comment: Glucose reference range applies only to samples taken after fasting for at least 8 hours.   BUN 15 8 - 23 mg/dL   Creatinine, Ser 1.02 0.61 - 1.24 mg/dL   Calcium 9.1 8.9 - 10.3 mg/dL   GFR, Estimated >60 >60 mL/min    Comment: (NOTE) Calculated using the CKD-EPI Creatinine Equation (2021)    Anion gap 11 5 - 15    Comment: Performed at [facility], [place] [address] [place],  [address]  Glucose, capillary     Status: Abnormal   Collection Time: 08/03/20  7:52 AM  Result Value Ref Range   Glucose-Capillary 104 (H) 70 - 99 mg/dL    Comment: Glucose reference range applies only to samples taken after fasting for at least 8 hours.    Current Facility-Administered Medications  Medication Dose Route Frequency Provider Last Rate Last Admin  . acetaminophen (TYLENOL) tablet 650 mg  650 mg Oral Q6H PRN Marylyn Ishihara, Tyrone A, DO   650 mg at 07/28/20 2110  . amLODipine (NORVASC) tablet 5 mg  5 mg Oral Daily Kyle, Tyrone A, DO   5 mg at 08/02/20 0920  . apixaban (ELIQUIS) tablet 5 mg  5 mg Oral BID Marylyn Ishihara, Tyrone A, DO   5 mg at 08/02/20 2200   . atorvastatin (LIPITOR) tablet 20 mg  20 mg Oral Daily Kyle, Tyrone A, DO   20 mg at 08/02/20 0920  . azaTHIOprine (IMURAN) tablet 200 mg  200 mg Oral Daily Cherene Altes, MD   200 mg at 08/02/20 0920  . diphenhydrAMINE (BENADRYL) 12.5 MG/5ML elixir 6.25 mg  6.25 mg Oral Q8H PRN Hosie Poisson, MD   6.25 mg at 07/31/20 2207  . diphenhydrAMINE-zinc acetate (BENADRYL) 2-0.1 % cream   Topical TID PRN Hosie Poisson, MD      . divalproex (DEPAKOTE SPRINKLE) capsule 500 mg  500 mg Oral QHS Bonnielee Haff, MD   500 mg at 08/02/20 2204  . divalproex (DEPAKOTE SPRINKLE) capsule 500 mg  500 mg Oral Daily Starkes-Perry, Gayland Curry, FNP      .  DULoxetine (CYMBALTA) DR capsule 60 mg  60 mg Oral Daily Kyle, Tyrone A, DO   60 mg at 08/02/20 0919  . feeding supplement (ENSURE ENLIVE / ENSURE PLUS) liquid 237 mL  237 mL Oral BID BM Hosie Poisson, MD   237 mL at 08/02/20 1700  . insulin aspart (novoLOG) injection 0-9 Units  0-9 Units Subcutaneous TID WC Kyle, Tyrone A, DO   1 Units at 08/02/20 1806  . levothyroxine (SYNTHROID) tablet 25 mcg  25 mcg Oral Q0600 Efraim Kaufmann, RPH   25 mcg at 08/03/20 0539  . LORazepam (ATIVAN) tablet 0.5 mg  0.5 mg Oral TID Hosie Poisson, MD   0.5 mg at 08/02/20 2200  . melatonin tablet 3 mg  3 mg Oral QHS Hosie Poisson, MD   3 mg at 08/02/20 2200  . metoprolol tartrate (LOPRESSOR) tablet 50 mg  50 mg Oral BID Cherene Altes, MD   50 mg at 08/02/20 2200  . OLANZapine (ZYPREXA) tablet 2.5 mg  2.5 mg Oral BID Suella Broad, FNP      . potassium chloride SA (KLOR-CON) CR tablet 40 mEq  40 mEq Oral Once Eugenie Filler, MD      . sodium chloride (OCEAN) 0.65 % nasal spray 1 spray  1 spray Each Nare PRN Cherene Altes, MD   1 spray at 07/20/20 2143  . vitamin B-12 (CYANOCOBALAMIN) tablet 1,000 mcg  1,000 mcg Oral Daily Bonnielee Haff, MD   1,000 mcg at 08/02/20 0920  . Vitamin D (Ergocalciferol) (DRISDOL) capsule 50,000 Units  50,000 Units Oral Q7 days  Cherene Altes, MD   50,000 Units at 07/29/20 0822    Musculoskeletal: Strength & Muscle Tone: within normal limits Gait & Station: normal Patient leans: N/A  Psychiatric Specialty Exam: Physical Exam  Review of Systems  Blood pressure 104/75, pulse 69, temperature 98.7 F (37.1 C), temperature source Oral, resp. rate 18, height 5' 7"  (1.702 m), weight 115.2 kg, SpO2 94 %.Body mass index is 39.78 kg/m.  General Appearance: Fairly Groomed and wearing hospital gown  Eye Contact:  None  Speech:  Clear and Coherent and Mumbling at times  Volume:  Normal  Mood:  unable to assess  Affect:  Constricted  Thought Process:  Irrelevant and Descriptions of Associations: Intact  Orientation:  Other:  alert and oreinted to self only.   Thought Content:  Illogical  Suicidal Thoughts:  Unable to assess  Homicidal Thoughts:  Unable to assess  Memory:  Immediate;   Poor Recent;   Poor Remote;   Poor  Judgement:  Poor  Insight:  Lacking  Psychomotor Activity:  Increased, Restlessness and agitation   Concentration:  Concentration: Poor and Attention Span: Poor  Recall:  Poor  Fund of Knowledge:  Poor  Language:  Fair  Akathisia:  No  Handed:  Right  AIMS (if indicated):     Assets:  Communication Skills Desire for Improvement Financial Resources/Insurance Leisure Time Physical Health Social Support  ADL's:  Impaired  Cognition:  Impaired,  Moderate  Sleep:      [name] is a 69 year old male with significant history of dementia and depression, who presented from home 3 weeks ago with increased agitation, altered mental status, and wandering behaviors.  On examination patient appears to be restless, disoriented, and remains in four-point restraints.  He was seemingly started on Seroquel although this was discontinued due to prolonged QTC.  He was started on olanzapine 5 mg p.o. nightly, which did improve  his behavior some however he continues to be agitated aggressive, combative,  and restless.  He was also started on Tegretol however did not obtain immediate results therefore this was discontinued by his medical team and started on Depakote.  Depakote level obtained this morning was 45.  Patient was also requiring multiple IM injections to help control his behavior disturbances, his safety and safety of others.  As of note he is no longer receiving IM injections, therefore does seem to be improving with medications.  Again his symptoms are consistent with dementia that appears to be progressing, patient will benefit from inpatient geriatric psych facility, memory care unit, and or long-term facility in which they are capable of managing his behaviors.  We will continue to make recommendations to decrease use of restraints decrease stimulation, frequent reorienting to his environment, and allowing family members at bedside to help with his behaviors.    Treatment Plan Summary: Plan Recent EKG obtained shows QTC with some improvement now down to 495.  Also obtain Depakote level which is currently 45, will increase Depakote to 500 mg p.o. twice daily.  We'll split Zyprexa 2.5 every morning and 5 mg p.o. nightly.  Patient with worsening memory impairment, confusion, restlessness and other behaviors that are consistent with a person with dementia.  Considering his medical work-up has been normal will recommend referral to geropsych inpatient facility. for further management of demetnia, aggression, agitation, and itrritabiltiy. Patient remains in 4 point restraints at this time with soft mitten restraitns in place.  - Increase Depakote 561m po BID to target behavioral disturbances associated with dementia, repeat levels in 4 days. Level obtained this morning was 45.  Increase Zyprexa 2.563mpo qam and zyprexa 5 mg po qhs to target agitation, aggression and agitation.  -Recommend coordinating inpatient geropsych referral with SW. Patient continues to require increase use of restraints, and  has shown minimal improvement since his admission.  Disposition: Recommend psychiatric Inpatient admission when medically cleared.  TaSuella BroadFNP 08/03/2020 9:41 AM

## 2020-08-03 NOTE — Progress Notes (Signed)
PROGRESS NOTE    [name] KLIMA Sr.  QIH:[id number] DOB: 22-Sep-1950 DOA: 07/13/2020 PCP: [name], DO (Confirm with patient/family/NH records and if not entered, this HAS to be entered at [facility] point of entry. "No PCP" if truly none.)   Chief Complaint  Patient presents with  . increase confusion    Brief Narrative:  69 year old gentleman prior history of atrial fibrillation on anticoagulation, dementia and depression brought to ED for increasing agitation.  As per the family he has been progressively having worsening dementia.  Multiple medications have been attempted in the outpatient setting without any improvement.  His initial CT of the head on admission was negative for acute findings without any evidence of acute infarction.  Patient is currently on Depakote ER to 250 mg during the day and 500 at bedtime along with Seroquel 50 mg daily. No Improvement with Seroquel, which was discontinued and changed to Zyprexa 5 mg at bedtime. He is not stable for discharge as he either agitated and is requiring 4 point restraints or sedated from the mediations.  He will require 24 hours supervision and assistance. Discussed the plan with family at bedside.  Wife is in the process of initiating medicaid for him and is looking for long term residence.  PT Evaluation ordered but they couldn't work with him as he has been in restraints.  Over the weekend he has had periods where he was off restraints and walked with the nursing staff in the hallways for a few hours.  .    Assessment & Plan:   Active Problems:   Major depression, recurrent, chronic (HCC)   Essential hypertension   GAD (generalized anxiety disorder)   Dementia with behavioral disturbance (HCC)   Atrial fibrillation with rapid ventricular response (HCC)   Hypokalemia   AMS (altered mental status)   Acute delirium  1 acute delirium superimposed on progressive chronic dementia Precipitated by recent death in the family.  Head CT  done negative for any acute abnormalities.  MRI unable to be done as patient not cooperative.  Patient with no focal neurological symptoms.  Unlikely MRI would change treatment plan at this time.  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  Alcohol level negative on admission.  Ammonia level normal.  TSH normal.  Thiamine normal.  Cortisol levels normal.  RPR nonreactive.  Patient with low vitamin B12 which is being supplemented.  Patient was seen in consultation by psychiatry who initially recommended medical management.  Patient started on Seroquel 25 mg nightly which has been increased to 50 mg without any significant improvement.  Seroquel was discontinued and patient started on Zyprexa 5 mg at bedtime.  Patient also on Depakote 250 mg in the morning and 500 mg at bedtime. Patient still requiring four-point restraints.  Due to ongoing agitation psychiatry reconsulted and patient seen by psychiatry who recommended adjustment of medications.  Depakote has been increased to 500 mg twice daily.  Psychiatry recommended Zyprexa 2.5 mg in the morning and 5 mg nightly.  Psychiatry recommending inpatient psychiatric admission/neuropsych inpatient admission once medically stable.  Social work consulted.  Psychiatry following and I appreciate the input and recommendations.  2.  Chronic atrial fibrillation Continue Lopressor for rate control.  Eliquis for anticoagulation.  Outpatient follow-up with cardiology for further discussion of ongoing anticoagulation due to patient's history of progressive worsening dementia.  3.  Prolonged QTC Improved.  Keep potassium >4, magnesium >2.  Follow.  4.  Type 2 diabetes mellitus well controlled Hemoglobin A1c 6.9 (06/30/2020).  CBG 104 this morning.  Continue sliding scale insulin.  5.  Hypothyroidism Continue Synthroid.  6.  Hypertension Stable.  Continue Lopressor, Norvasc.  7.  Depression/anxiety Continue Cymbalta, Ativan.  Psychiatry following.  8.  Vitamin B12  deficiency Continue oral vitamin B12 supplementation.  Outpatient follow-up.  9.  Vitamin D deficiency Continue vitamin D supplementation.  10.  History of ulcerative colitis Continue Imuran.  Outpatient follow-up with GI.   DVT prophylaxis: Eliquis Code Status: DNR Family Communication: No family at bedside. Disposition:   Status is: Inpatient    Dispo:  Patient From: Home  Planned Disposition: Inpatient psychiatry/[facility]  Expected discharge date: 2 to 3 days.  Medically stable for discharge: No        Consultants:   Psychiatry: Dr. Dwyane Dee 08/03/2020, Dr. Toy Care 07/22/2020, Dr.Kumar, 07/14/2020    Procedures:   CT head 07/13/2020  Chest x-ray 07/13/2020, 07/30/2020  Antimicrobials:  Anti-infectives (From admission, onward)   Start     Dose/Rate Route Frequency Ordered Stop   07/14/20 0445  valACYclovir (VALTREX) tablet 1,000 mg  Status:  Discontinued       Note to Pharmacy: dermatonal shingles   1,000 mg Oral 3 times daily 07/14/20 0357 07/17/20 1007        Subjective: Patient resting calmly.  Patient in four-point restraints.  Eyes are closed however answering some questions.  Confused.  Alert to self only.  Sitter at bedside.  Patient in four-point restraints.  Less agitated this morning per nurse tech.  Objective: Vitals:   08/02/20 0548 08/02/20 1400 08/02/20 2043 08/03/20 0629  BP: (!) 133/54 140/84 117/86 104/75  Pulse: 63 96 97 69  Resp: 18 20 19 18   Temp: 98 F (36.7 C) 99.1 F (37.3 C) 98.9 F (37.2 C) 98.7 F (37.1 C)  TempSrc: Oral Axillary Oral Oral  SpO2: 93% 93% 94% 94%  Weight:      Height:        Intake/Output Summary (Last 24 hours) at 08/03/2020 1052 Last data filed at 08/02/2020 2200 Gross per 24 hour  Intake 300 ml  Output 550 ml  Net -250 ml   Filed Weights   07/20/20 0922  Weight: 115.2 kg    Examination:  General exam: Asleep.  Patient in four-point restraints. Respiratory system: Clear to auscultation  anterior lung fields. Respiratory effort normal. Cardiovascular system: S1 & S2 heard, RRR. No JVD, murmurs, rubs, gallops or clicks. No pedal edema. Gastrointestinal system: Abdomen is nondistended, soft and nontender. No organomegaly or masses felt. Normal bowel sounds heard. Central nervous system: Alert and oriented. No focal neurological deficits. Extremities: Symmetric 5 x 5 power. Skin: No rashes, lesions or ulcers Psychiatry: Judgement and insight appear poor. Mood & affect appropriate.     Data Reviewed: I have personally reviewed following labs and imaging studies  CBC: Recent Labs  Lab 07/31/20 0338 08/03/20 0423  WBC 11.3* 7.8  HGB 16.0 15.2  HCT 48.0 45.6  MCV 101.3* 100.9*  PLT 221 161    Basic Metabolic Panel: Recent Labs  Lab 07/29/20 0437 07/31/20 0338 08/02/20 0354 08/03/20 0423  NA 140 144  --  139  K 3.7 4.2  --  3.4*  CL 100 104  --  98  CO2 28 26  --  30  GLUCOSE 113* 121*  --  106*  BUN 16 20  --  15  CREATININE 1.05 1.15  --  1.02  CALCIUM 9.5 9.6  --  9.1  MG 2.2  --  2.0  --     GFR: Estimated Creatinine Clearance: 80.5 mL/min (by C-G formula based on SCr of 1.02 mg/dL).  Liver Function Tests: No results for input(s): AST, ALT, ALKPHOS, BILITOT, PROT, ALBUMIN in the last 168 hours.  CBG: Recent Labs  Lab 08/02/20 0810 08/02/20 1139 08/02/20 1636 08/02/20 2046 08/03/20 0752  GLUCAP 112* 130* 137* 109* 104*     No results found for this or any previous visit (from the past 240 hour(s)).       Radiology Studies: No results found.      Scheduled Meds: . amLODipine  5 mg Oral Daily  . apixaban  5 mg Oral BID  . atorvastatin  20 mg Oral Daily  . azaTHIOprine  200 mg Oral Daily  . divalproex  500 mg Oral QHS  . divalproex  500 mg Oral Daily  . DULoxetine  60 mg Oral Daily  . feeding supplement  237 mL Oral BID BM  . insulin aspart  0-9 Units Subcutaneous TID WC  . levothyroxine  25 mcg Oral Q0600  . LORazepam  0.5  mg Oral TID  . melatonin  3 mg Oral QHS  . metoprolol tartrate  50 mg Oral BID  . OLANZapine  2.5 mg Oral BID  . potassium chloride  40 mEq Oral Once  . vitamin B-12  1,000 mcg Oral Daily  . Vitamin D (Ergocalciferol)  50,000 Units Oral Q7 days   Continuous Infusions:   LOS: 20 days    Time spent: 35 mins    Irine Seal, MD [facility]   To contact the attending provider between 7A-7P or the covering provider during after hours 7P-7A, please log into the web site [email or web address] and access using universal [place] password for that web site. If you do not have the password, please call the hospital operator.  08/03/2020, 10:52 AM

## 2020-08-04 DIAGNOSIS — F0391 Unspecified dementia with behavioral disturbance: Secondary | ICD-10-CM | POA: Diagnosis not present

## 2020-08-04 DIAGNOSIS — I4891 Unspecified atrial fibrillation: Secondary | ICD-10-CM | POA: Diagnosis not present

## 2020-08-04 DIAGNOSIS — F339 Major depressive disorder, recurrent, unspecified: Secondary | ICD-10-CM | POA: Diagnosis not present

## 2020-08-04 DIAGNOSIS — I10 Essential (primary) hypertension: Secondary | ICD-10-CM | POA: Diagnosis not present

## 2020-08-04 DIAGNOSIS — F411 Generalized anxiety disorder: Secondary | ICD-10-CM | POA: Diagnosis not present

## 2020-08-04 DIAGNOSIS — E039 Hypothyroidism, unspecified: Secondary | ICD-10-CM | POA: Diagnosis not present

## 2020-08-04 DIAGNOSIS — R9431 Abnormal electrocardiogram [ECG] [EKG]: Secondary | ICD-10-CM | POA: Diagnosis not present

## 2020-08-04 DIAGNOSIS — R410 Disorientation, unspecified: Secondary | ICD-10-CM | POA: Diagnosis not present

## 2020-08-04 DIAGNOSIS — E876 Hypokalemia: Secondary | ICD-10-CM | POA: Diagnosis not present

## 2020-08-04 LAB — CBC WITH DIFFERENTIAL/PLATELET
Abs Immature Granulocytes: 0.09 10*3/uL — ABNORMAL HIGH (ref 0.00–0.07)
Basophils Absolute: 0.1 10*3/uL (ref 0.0–0.1)
Basophils Relative: 1 %
Eosinophils Absolute: 0.2 10*3/uL (ref 0.0–0.5)
Eosinophils Relative: 2 %
HCT: 45.9 % (ref 39.0–52.0)
Hemoglobin: 15.5 g/dL (ref 13.0–17.0)
Immature Granulocytes: 1 %
Lymphocytes Relative: 11 %
Lymphs Abs: 1 10*3/uL (ref 0.7–4.0)
MCH: 33.3 pg (ref 26.0–34.0)
MCHC: 33.8 g/dL (ref 30.0–36.0)
MCV: 98.5 fL (ref 80.0–100.0)
Monocytes Absolute: 0.8 10*3/uL (ref 0.1–1.0)
Monocytes Relative: 9 %
Neutro Abs: 6.6 10*3/uL (ref 1.7–7.7)
Neutrophils Relative %: 76 %
Platelets: 218 10*3/uL (ref 150–400)
RBC: 4.66 MIL/uL (ref 4.22–5.81)
RDW: 15.1 % (ref 11.5–15.5)
WBC: 8.6 10*3/uL (ref 4.0–10.5)
nRBC: 0 % (ref 0.0–0.2)

## 2020-08-04 LAB — GLUCOSE, CAPILLARY
Glucose-Capillary: 111 mg/dL — ABNORMAL HIGH (ref 70–99)
Glucose-Capillary: 112 mg/dL — ABNORMAL HIGH (ref 70–99)
Glucose-Capillary: 98 mg/dL (ref 70–99)

## 2020-08-04 LAB — MAGNESIUM: Magnesium: 1.9 mg/dL (ref 1.7–2.4)

## 2020-08-04 LAB — BASIC METABOLIC PANEL
Anion gap: 10 (ref 5–15)
BUN: 14 mg/dL (ref 8–23)
CO2: 26 mmol/L (ref 22–32)
Calcium: 9.3 mg/dL (ref 8.9–10.3)
Chloride: 103 mmol/L (ref 98–111)
Creatinine, Ser: 1 mg/dL (ref 0.61–1.24)
GFR, Estimated: >60 mL/min (ref >60)
Glucose, Bld: 122 mg/dL — ABNORMAL HIGH (ref 70–99)
Potassium: 3.9 mmol/L (ref 3.5–5.1)
Sodium: 139 mmol/L (ref 135–145)

## 2020-08-04 MED ORDER — OLANZAPINE 5 MG PO TABS
2.5000 mg | ORAL_TABLET | Freq: Every day | ORAL | Status: DC
  Administered 2020-08-05 – 2020-08-06 (×2): 2.5 mg via ORAL
  Filled 2020-08-04 (×2): qty 1

## 2020-08-04 MED ORDER — OLANZAPINE 5 MG PO TABS
5.0000 mg | ORAL_TABLET | Freq: Every day | ORAL | Status: DC
  Administered 2020-08-04 – 2020-08-05 (×2): 5 mg via ORAL
  Filled 2020-08-04 (×2): qty 1

## 2020-08-04 NOTE — Progress Notes (Signed)
PROGRESS NOTE    [name] JULIAN Sr.  QRF:[id number] DOB: [birth year] DOA: 07/13/2020 PCP: [name], DO    Chief Complaint  Patient presents with  . increase confusion    Brief Narrative:  69 year old gentleman prior history of atrial fibrillation on anticoagulation, dementia and depression brought to ED for increasing agitation.  As per the family he has been progressively having worsening dementia.  Multiple medications have been attempted in the outpatient setting without any improvement.  His initial CT of the head on admission was negative for acute findings without any evidence of acute infarction.  Patient is currently on Depakote ER to 250 mg during the day and 500 at bedtime along with Seroquel 50 mg daily. No Improvement with Seroquel, which was discontinued and changed to Zyprexa 5 mg at bedtime. He is not stable for discharge as he either agitated and is requiring 4 point restraints or sedated from the mediations.  He will require 24 hours supervision and assistance. Discussed the plan with family at bedside.  Wife is in the process of initiating medicaid for him and is looking for long term residence.  PT Evaluation ordered but they couldn't work with him as he has been in restraints.  Over the weekend he has had periods where he was off restraints and walked with the nursing staff in the hallways for a few hours.  .    Assessment & Plan:   Active Problems:   Major depression, recurrent, chronic (HCC)   Essential hypertension   GAD (generalized anxiety disorder)   Dementia with behavioral disturbance (HCC)   Atrial fibrillation with rapid ventricular response (HCC)   Hypokalemia   AMS (altered mental status)   Acute delirium   Hypothyroidism   Vitamin B 12 deficiency  1 acute delirium superimposed on progressive chronic dementia Precipitated by recent death in the family.  Head CT done negative for any acute abnormalities.  MRI unable to be done as patient not  cooperative.  Patient with no focal neurological symptoms.  Unlikely MRI would change treatment plan at this time.  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  Alcohol level negative on admission.  Ammonia level normal.  TSH normal.  Thiamine normal.  Cortisol levels normal.  RPR nonreactive.  Patient with low vitamin B12 which is being supplemented.  Patient was seen in consultation by psychiatry who initially recommended medical management.  Patient started on Seroquel 25 mg nightly which has been increased to 50 mg without any significant improvement.  Seroquel was discontinued and patient started on Zyprexa 5 mg at bedtime.  Patient also on Depakote 250 mg in the morning and 500 mg at bedtime. Patient still requiring four-point restraints.  Due to ongoing agitation psychiatry reconsulted and patient seen by psychiatry who recommended adjustment of medications.  Depakote has been increased to 500 mg twice daily.  Psychiatry recommended Zyprexa 2.5 mg in the morning and 5 mg nightly.  Psychiatry has placed the order for valproic acid level for 08/07/2020 with target goal of 80-100.  Psychiatry also recommended Posey belt to help reduce use of restraints, decrease simulation and offer a more calm safe environment for the patient.  Psychiatry recommending inpatient psychiatric admission/neuropsych inpatient admission once medically stable.  Social work consulted.  Psychiatry following and I appreciate the input and recommendations.  2.  Chronic atrial fibrillation Continue Lopressor for rate control.  Eliquis for anticoagulation.  Outpatient follow-up with cardiology for further discussion of ongoing anticoagulation due to patient's history of progressive worsening  dementia.  3.  Prolonged QTC Improved.  Potassium at 3.9.  Magnesium at 1.9.  Follow.   4.  Type 2 diabetes mellitus well controlled Hemoglobin A1c 6.9 (06/30/2020).  CBG 111 this morning.  Sliding scale insulin.   5.   Hypothyroidism Synthroid.   6.  Hypertension Controlled on current regimen of Lopressor and Norvasc.   7.  Depression/anxiety Continue Cymbalta, Ativan.  Psychiatry following.  8.  Vitamin B12 deficiency Continue oral vitamin B12 supplementation.  Outpatient follow-up.  9.  Vitamin D deficiency Continue vitamin D supplementation.  10.  History of ulcerative colitis Imuran.  Outpatient follow-up with GI.     DVT prophylaxis: Eliquis Code Status: DNR Family Communication: No family at bedside. Disposition:   Status is: Inpatient    Dispo:  Patient From: Home  Planned Disposition: Inpatient psychiatry/[facility]  Expected discharge date: 2 to 3 days.  Medically stable for discharge: No patient still in four-point restraints with agitation.        Consultants:   Psychiatry: Dr. Dwyane Dee 08/03/2020, Dr. Toy Care 07/22/2020, Dr.Kumar, 07/14/2020    Procedures:   CT head 07/13/2020  Chest x-ray 07/13/2020, 07/30/2020  Antimicrobials:  Anti-infectives (From admission, onward)   Start     Dose/Rate Route Frequency Ordered Stop   07/14/20 0445  valACYclovir (VALTREX) tablet 1,000 mg  Status:  Discontinued       Note to Pharmacy: dermatonal shingles   1,000 mg Oral 3 times daily 07/14/20 0357 07/17/20 1007       Subjective: Patient noted to be awake most of the night per nurse tech.  Patient with some agitation and in four-point restraints.  Patient asleep comfortably.   Objective: Vitals:   08/04/20 0539 08/04/20 0539 08/04/20 0805 08/04/20 0808  BP:  109/76 123/83   Pulse:  (!) 38 92 83  Resp:   20   Temp: 98.5 F (36.9 C)  98.6 F (37 C)   TempSrc: Oral     SpO2:  99% 95%   Weight:      Height:        Intake/Output Summary (Last 24 hours) at 08/04/2020 1141 Last data filed at 08/04/2020 8119 Gross per 24 hour  Intake 460 ml  Output --  Net 460 ml   Filed Weights   07/20/20 0922  Weight: 115.2 kg    Examination:  General exam: Asleep.  In  four-point restraints. Respiratory system: CTA B anterior lung fields.  No wheezes, no crackles, no rhonchi.   Cardiovascular system: Regular rate and rhythm no murmurs rubs or gallops.  No JVD.  No lower extremity edema.  Gastrointestinal system: Abdomen is soft, nontender, nondistended, positive bowel sounds.  No rebound.  No guarding. Central nervous system: Asleep. No focal neurological deficits. Extremities: Symmetric 5 x 5 power. Skin: No rashes, lesions or ulcers Psychiatry: Judgement and insight appear poor. Mood & affect appropriate.     Data Reviewed: I have personally reviewed following labs and imaging studies  CBC: Recent Labs  Lab 07/31/20 0338 08/03/20 0423 08/04/20 0356  WBC 11.3* 7.8 8.6  NEUTROABS  --   --  6.6  HGB 16.0 15.2 15.5  HCT 48.0 45.6 45.9  MCV 101.3* 100.9* 98.5  PLT 221 182 147    Basic Metabolic Panel: Recent Labs  Lab 07/29/20 0437 07/31/20 0338 08/02/20 0354 08/03/20 0423 08/04/20 0356  NA 140 144  --  139 139  K 3.7 4.2  --  3.4* 3.9  CL 100 104  --  98 103  CO2 28 26  --  30 26  GLUCOSE 113* 121*  --  106* 122*  BUN 16 20  --  15 14  CREATININE 1.05 1.15  --  1.02 1.00  CALCIUM 9.5 9.6  --  9.1 9.3  MG 2.2  --  2.0  --  1.9    GFR: Estimated Creatinine Clearance: 82.1 mL/min (by C-G formula based on SCr of 1 mg/dL).  Liver Function Tests: No results for input(s): AST, ALT, ALKPHOS, BILITOT, PROT, ALBUMIN in the last 168 hours.  CBG: Recent Labs  Lab 08/03/20 0752 08/03/20 1111 08/03/20 1854 08/03/20 2228 08/04/20 0800  GLUCAP 104* 117* 126* 113* 111*     No results found for this or any previous visit (from the past 240 hour(s)).       Radiology Studies: No results found.      Scheduled Meds: . amLODipine  5 mg Oral Daily  . apixaban  5 mg Oral BID  . atorvastatin  20 mg Oral Daily  . azaTHIOprine  200 mg Oral Daily  . divalproex  500 mg Oral QHS  . divalproex  500 mg Oral Daily  . DULoxetine  60  mg Oral Daily  . feeding supplement  237 mL Oral BID BM  . insulin aspart  0-9 Units Subcutaneous TID WC  . levothyroxine  25 mcg Oral Q0600  . LORazepam  0.5 mg Oral TID  . melatonin  3 mg Oral QHS  . metoprolol tartrate  50 mg Oral BID  . [START ON 08/05/2020] OLANZapine  2.5 mg Oral Daily  . OLANZapine  5 mg Oral QHS  . vitamin B-12  1,000 mcg Oral Daily  . Vitamin D (Ergocalciferol)  50,000 Units Oral Q7 days   Continuous Infusions:   LOS: 21 days    Time spent: 35 mins    Irine Seal, MD [facility]   To contact the attending provider between 7A-7P or the covering provider during after hours 7P-7A, please log into the web site [email or web address] and access using universal  password for that web site. If you do not have the password, please call the hospital operator.  08/04/2020, 11:41 AM

## 2020-08-04 NOTE — Progress Notes (Signed)
Given his risk of injury to self and nursing staff, Medication adjustments have been made to reflect olanzapine 2.15m po qam and olanzapine 545mpo qhs. Also placed order for repeat valproic acid level on 11/28, with a target goal of 80-100 to target behavorial disturbances.   Patient continues to have daytime agitation, confusion, and disorientation, current recommendation for geriatric psych facility for stabilization, behavioral disturbances, difficulty coping with losses, and ongoing management of care that is interfering with his ability to be managed at home. Patient continues to remain in 4 point wrist restraints, most of the duration of his 22 day stay.Restraints are for his safety as well as safety of all staff, however this can continue to aggravate his neuropsychiatric symptoms. It is worth noting that most facilities have exclusion criteria of restraints use within 24 hours of admission. Ideally we would need to reduce the use of restraints in order to get him in a geriatric psych facility.   -Recommend a Posey bed at this time time to help reduce use of restraints, decrease stimulation, and offer more calm safe environment for the patient.  -Continue working closely with social work for inpatient geriatric admission.  -Patient to remain under IVC as he continues to be a threat to self and others.

## 2020-08-05 DIAGNOSIS — I10 Essential (primary) hypertension: Secondary | ICD-10-CM | POA: Diagnosis not present

## 2020-08-05 DIAGNOSIS — F339 Major depressive disorder, recurrent, unspecified: Secondary | ICD-10-CM | POA: Diagnosis not present

## 2020-08-05 DIAGNOSIS — R9431 Abnormal electrocardiogram [ECG] [EKG]: Secondary | ICD-10-CM | POA: Diagnosis not present

## 2020-08-05 DIAGNOSIS — R410 Disorientation, unspecified: Secondary | ICD-10-CM | POA: Diagnosis not present

## 2020-08-05 DIAGNOSIS — I4891 Unspecified atrial fibrillation: Secondary | ICD-10-CM | POA: Diagnosis not present

## 2020-08-05 DIAGNOSIS — F411 Generalized anxiety disorder: Secondary | ICD-10-CM | POA: Diagnosis not present

## 2020-08-05 DIAGNOSIS — F0391 Unspecified dementia with behavioral disturbance: Secondary | ICD-10-CM | POA: Diagnosis not present

## 2020-08-05 DIAGNOSIS — E876 Hypokalemia: Secondary | ICD-10-CM | POA: Diagnosis not present

## 2020-08-05 DIAGNOSIS — E039 Hypothyroidism, unspecified: Secondary | ICD-10-CM | POA: Diagnosis not present

## 2020-08-05 LAB — GLUCOSE, CAPILLARY
Glucose-Capillary: 112 mg/dL — ABNORMAL HIGH (ref 70–99)
Glucose-Capillary: 107 mg/dL — ABNORMAL HIGH (ref 70–99)
Glucose-Capillary: 129 mg/dL — ABNORMAL HIGH (ref 70–99)
Glucose-Capillary: 90 mg/dL (ref 70–99)

## 2020-08-05 NOTE — Progress Notes (Signed)
PROGRESS NOTE    [name] CRESCENZO Sr.  XNA:[id number] DOB: 1951/07/13 DOA: 07/13/2020 PCP: [name], DO    Chief Complaint  Patient presents with   increase confusion    Brief Narrative:  69 year old gentleman prior history of atrial fibrillation on anticoagulation, dementia and depression brought to ED for increasing agitation.  As per the family he has been progressively having worsening dementia.  Multiple medications have been attempted in the outpatient setting without any improvement.  His initial CT of the head on admission was negative for acute findings without any evidence of acute infarction.  Patient is currently on Depakote ER to 250 mg during the day and 500 at bedtime along with Seroquel 50 mg daily. No Improvement with Seroquel, which was discontinued and changed to Zyprexa 5 mg at bedtime. He is not stable for discharge as he either agitated and is requiring 4 point restraints or sedated from the mediations.  He will require 24 hours supervision and assistance. Discussed the plan with family at bedside.  Wife is in the process of initiating medicaid for him and is looking for long term residence.  PT Evaluation ordered but they couldn't work with him as he has been in restraints.  Over the weekend he has had periods where he was off restraints and walked with the nursing staff in the hallways for a few hours.  .    Assessment & Plan:   Active Problems:   Major depression, recurrent, chronic (HCC)   Essential hypertension   GAD (generalized anxiety disorder)   Dementia with behavioral disturbance (HCC)   Atrial fibrillation with rapid ventricular response (HCC)   Hypokalemia   AMS (altered mental status)   Acute delirium   Hypothyroidism   Vitamin B 12 deficiency  1 acute delirium superimposed on progressive chronic dementia Precipitated by recent death in the family.  Head CT done negative for any acute abnormalities.  MRI unable to be done as patient not  cooperative.  Patient with no focal neurological symptoms.  Unlikely MRI would change treatment plan at this time.  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  Alcohol level negative on admission.  Ammonia level normal.  TSH normal.  Thiamine normal.  Cortisol levels normal.  RPR nonreactive.  Patient with low vitamin B12 which is being supplemented.  Patient was seen in consultation by psychiatry who initially recommended medical management.  Patient started on Seroquel 25 mg nightly which has been increased to 50 mg without any significant improvement.  Seroquel was discontinued and patient started on Zyprexa 5 mg at bedtime.  Patient also on Depakote 250 mg in the morning and 500 mg at bedtime. Patient still requiring four-point restraints.  Due to ongoing agitation psychiatry reconsulted and patient seen by psychiatry who recommended adjustment of medications.  Depakote has been increased to 500 mg twice daily.  Psychiatry recommended Zyprexa 2.5 mg in the morning and 5 mg nightly.  Psychiatry has placed the order for valproic acid level for 08/07/2020 with target goal of 80-100.  Psychiatry also recommended Posey belt to help reduce use of restraints, decrease simulation and offer a more calm safe environment for the patient.  Posey belt ordered however due to increased agitation patient placed back in four-point restraints.  If continued agitation may need to increase morning dose Zyprexa from 2.5 to 5 mg daily however will defer to psychiatry.  Psychiatry recommending inpatient psychiatric admission/neuropsych inpatient admission once medically stable.  Social work consulted.  Psychiatry following and I appreciate  the input and recommendations.  2.  Chronic atrial fibrillation Lopressor for rate control.  Eliquis for anticoagulation.  Outpatient follow-up with cardiology for further discussions on ongoing anticoagulation due to history of progressive worsening dementia.   3.  Prolonged QTC Improved.   Potassium at 3.9.  Magnesium at 1.9.  Follow.   4.  Type 2 diabetes mellitus well controlled Hemoglobin A1c 6.9 (06/30/2020).  CBG 112 this morning.  Sliding scale insulin.    5.  Hypothyroidism Continue Synthroid.   6.  Hypertension Continue Norvasc, Lopressor.  Follow.   7.  Depression/anxiety Continue Cymbalta, Ativan.  Psychiatry following.  8.  Vitamin B12 deficiency Oral vitamin B12 supplementation.  Outpatient follow-up.    9.  Vitamin D deficiency Continue vitamin D supplementation.  10.  History of ulcerative colitis Continue Imuran.  Outpatient follow-up with GI.     DVT prophylaxis: Eliquis Code Status: DNR Family Communication: No family at bedside. Disposition:   Status is: Inpatient    Dispo:  Patient From: Home  Planned Disposition: Inpatient psychiatry/[facility]  Expected discharge date: 2 to 3 days.  Medically stable for discharge: No patient still in four-point restraints with agitation.        Consultants:   Psychiatry: Dr. Dwyane Dee 08/03/2020, Dr. Toy Care 07/22/2020, Dr.Kumar, 07/14/2020    Procedures:   CT head 07/13/2020  Chest x-ray 07/13/2020, 07/30/2020  Antimicrobials:  Anti-infectives (From admission, onward)   Start     Dose/Rate Route Frequency Ordered Stop   07/14/20 0445  valACYclovir (VALTREX) tablet 1,000 mg  Status:  Discontinued       Note to Pharmacy: dermatonal shingles   1,000 mg Oral 3 times daily 07/14/20 0357 07/17/20 1007       Subjective: Patient with eyes closed but arousable.  Getting daily.  In four-point restraints.  Noted to be agitated early on this morning.  Denies chest pain.  No shortness of breath.  Somewhat confused.   Objective: Vitals:   08/04/20 0808 08/04/20 1409 08/04/20 2112 08/05/20 0529  BP:  129/82 113/79 120/71  Pulse: 83 93 78 72  Resp:  (!) 22 18 18   Temp:  98.1 F (36.7 C) 98.6 F (37 C) 98.4 F (36.9 C)  TempSrc:  Oral Oral Oral  SpO2:  96% 96% 99%  Weight:      Height:         Intake/Output Summary (Last 24 hours) at 08/05/2020 1107 Last data filed at 08/05/2020 0900 Gross per 24 hour  Intake 876 ml  Output 400 ml  Net 476 ml   Filed Weights   07/20/20 0922  Weight: 115.2 kg    Examination:  General exam: In four-point restraints. Respiratory system: Lungs clear to auscultation bilaterally anterior lung fields.  Cardiovascular system: RRR no murmurs rubs or gallops.  No JVD.  No lower extremity edema.   Gastrointestinal system: Abdomen is soft, nontender, nondistended, positive bowel sounds.  No rebound.  No guarding. Central nervous system: Alert.  Moving extremities spontaneously.  No focal neurological deficits. Extremities: Symmetric 5 x 5 power. Skin: No rashes, lesions or ulcers Psychiatry: Judgement and insight appear poor. Mood & affect appropriate.     Data Reviewed: I have personally reviewed following labs and imaging studies  CBC: Recent Labs  Lab 07/31/20 0338 08/03/20 0423 08/04/20 0356  WBC 11.3* 7.8 8.6  NEUTROABS  --   --  6.6  HGB 16.0 15.2 15.5  HCT 48.0 45.6 45.9  MCV 101.3* 100.9* 98.5  PLT 221 182  938    Basic Metabolic Panel: Recent Labs  Lab 07/31/20 0338 08/02/20 0354 08/03/20 0423 08/04/20 0356  NA 144  --  139 139  K 4.2  --  3.4* 3.9  CL 104  --  98 103  CO2 26  --  30 26  GLUCOSE 121*  --  106* 122*  BUN 20  --  15 14  CREATININE 1.15  --  1.02 1.00  CALCIUM 9.6  --  9.1 9.3  MG  --  2.0  --  1.9    GFR: Estimated Creatinine Clearance: 82.1 mL/min (by C-G formula based on SCr of 1 mg/dL).  Liver Function Tests: No results for input(s): AST, ALT, ALKPHOS, BILITOT, PROT, ALBUMIN in the last 168 hours.  CBG: Recent Labs  Lab 08/03/20 2228 08/04/20 0800 08/04/20 1157 08/04/20 1626 08/05/20 0748  GLUCAP 113* 111* 112* 98 112*     No results found for this or any previous visit (from the past 240 hour(s)).       Radiology Studies: No results found.      Scheduled Meds:   amLODipine  5 mg Oral Daily   apixaban  5 mg Oral BID   atorvastatin  20 mg Oral Daily   azaTHIOprine  200 mg Oral Daily   divalproex  500 mg Oral QHS   divalproex  500 mg Oral Daily   DULoxetine  60 mg Oral Daily   feeding supplement  237 mL Oral BID BM   insulin aspart  0-9 Units Subcutaneous TID WC   levothyroxine  25 mcg Oral Q0600   LORazepam  0.5 mg Oral TID   melatonin  3 mg Oral QHS   metoprolol tartrate  50 mg Oral BID   OLANZapine  2.5 mg Oral Daily   OLANZapine  5 mg Oral QHS   vitamin B-12  1,000 mcg Oral Daily   Vitamin D (Ergocalciferol)  50,000 Units Oral Q7 days   Continuous Infusions:   LOS: 22 days    Time spent: 35 mins    Irine Seal, MD [facility]   To contact the attending provider between 7A-7P or the covering provider during after hours 7P-7A, please log into the web site [email or web address] and access using universal [place] password for that web site. If you do not have the password, please call the hospital operator.  08/05/2020, 11:07 AM

## 2020-08-06 DIAGNOSIS — F411 Generalized anxiety disorder: Secondary | ICD-10-CM | POA: Diagnosis not present

## 2020-08-06 DIAGNOSIS — R410 Disorientation, unspecified: Secondary | ICD-10-CM | POA: Diagnosis not present

## 2020-08-06 DIAGNOSIS — R9431 Abnormal electrocardiogram [ECG] [EKG]: Secondary | ICD-10-CM | POA: Diagnosis not present

## 2020-08-06 DIAGNOSIS — I4891 Unspecified atrial fibrillation: Secondary | ICD-10-CM | POA: Diagnosis not present

## 2020-08-06 DIAGNOSIS — E039 Hypothyroidism, unspecified: Secondary | ICD-10-CM | POA: Diagnosis not present

## 2020-08-06 DIAGNOSIS — E876 Hypokalemia: Secondary | ICD-10-CM | POA: Diagnosis not present

## 2020-08-06 DIAGNOSIS — F0391 Unspecified dementia with behavioral disturbance: Secondary | ICD-10-CM | POA: Diagnosis not present

## 2020-08-06 DIAGNOSIS — I10 Essential (primary) hypertension: Secondary | ICD-10-CM | POA: Diagnosis not present

## 2020-08-06 DIAGNOSIS — F339 Major depressive disorder, recurrent, unspecified: Secondary | ICD-10-CM | POA: Diagnosis not present

## 2020-08-06 LAB — GLUCOSE, CAPILLARY
Glucose-Capillary: 104 mg/dL — ABNORMAL HIGH (ref 70–99)
Glucose-Capillary: 117 mg/dL — ABNORMAL HIGH (ref 70–99)
Glucose-Capillary: 157 mg/dL — ABNORMAL HIGH (ref 70–99)
Glucose-Capillary: 108 mg/dL — ABNORMAL HIGH (ref 70–99)

## 2020-08-06 MED ORDER — LORAZEPAM 2 MG/ML IJ SOLN
1.0000 mg | Freq: Once | INTRAMUSCULAR | Status: AC
  Administered 2020-08-06: 1 mg via INTRAVENOUS
  Filled 2020-08-06: qty 1

## 2020-08-06 MED ORDER — OLANZAPINE 5 MG PO TABS
5.0000 mg | ORAL_TABLET | Freq: Two times a day (BID) | ORAL | Status: DC
  Administered 2020-08-06 – 2020-09-01 (×52): 5 mg via ORAL
  Filled 2020-08-06 (×52): qty 1

## 2020-08-06 NOTE — Progress Notes (Signed)
Nurse called into room by sitter as pt had flip himself over the waist belt and was not redirectable. Pt observed on the bed on both arms and knees trying to remove waist belt and patient gown. When approached by staff to help him, he became verbally aggressive and almost "elbowed" another staff member and insisted on getting out of bed to ambulate to the bathroom. Security was called to assist nursing staff in guiding patient back to bed after he finished in the bathroom. APP X. Blount was notified and an order for bilateral wrist restraints was added to the already existing waist belt restraint order.

## 2020-08-06 NOTE — Progress Notes (Signed)
PROGRESS NOTE    [name] Sr.  FKC:[id number] DOB: [birth year] DOA: 07/13/2020 PCP: [name], DO    Chief Complaint  Patient presents with  . increase confusion    Brief Narrative:  69 year old gentleman prior history of atrial fibrillation on anticoagulation, dementia and depression brought to ED for increasing agitation.  As per the family he has been progressively having worsening dementia.  Multiple medications have been attempted in the outpatient setting without any improvement.  His initial CT of the head on admission was negative for acute findings without any evidence of acute infarction.  Patient is currently on Depakote ER to 250 mg during the day and 500 at bedtime along with Seroquel 50 mg daily. No Improvement with Seroquel, which was discontinued and changed to Zyprexa 5 mg at bedtime. He is not stable for discharge as he either agitated and is requiring 4 point restraints or sedated from the mediations.  He will require 24 hours supervision and assistance. Discussed the plan with family at bedside.  Wife is in the process of initiating medicaid for him and is looking for long term residence.  PT Evaluation ordered but they couldn't work with him as he has been in restraints.  Over the weekend he has had periods where he was off restraints and walked with the nursing staff in the hallways for a few hours.  .    Assessment & Plan:   Active Problems:   Major depression, recurrent, chronic (HCC)   Essential hypertension   GAD (generalized anxiety disorder)   Dementia with behavioral disturbance (HCC)   Atrial fibrillation with rapid ventricular response (HCC)   Hypokalemia   AMS (altered mental status)   Acute delirium   Hypothyroidism   Vitamin B 12 deficiency  1 acute delirium superimposed on progressive chronic dementia Precipitated by recent death in the family.  Head CT done negative for any acute abnormalities.  MRI unable to be done as patient not  cooperative.  Patient with no focal neurological symptoms.  Unlikely MRI would change treatment plan at this time.  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  Alcohol level negative on admission.  Ammonia level normal.  TSH normal.  Thiamine normal.  Cortisol levels normal.  RPR nonreactive.  Patient with low vitamin B12 which is being supplemented.  Patient was seen in consultation by psychiatry who initially recommended medical management.  Patient started on Seroquel 25 mg nightly which has been increased to 50 mg without any significant improvement.  Seroquel was discontinued and patient started on Zyprexa 5 mg at bedtime.  Patient was  also on Depakote 250 mg in the morning and 500 mg at bedtime. Patient still requiring four-point restraints.  Patient with agitation and aggressiveness.  Due to ongoing agitation psychiatry reconsulted and patient seen by psychiatry who recommended adjustment of medications.  Depakote has been increased to 500 mg twice daily.  Psychiatry recommended Zyprexa 2.5 mg in the morning and 5 mg nightly.  Psychiatry has placed the order for valproic acid level for 08/07/2020 with target goal of 80-100.  Due to ongoing agitation will increase morning dose Zyprexa to 5 mg and continue Zyprexa 5 mg nightly.   Psychiatry also recommended Posey belt to help reduce use of restraints, decrease simulation and offer a more calm safe environment for the patient.  Posey belt ordered however due to increased agitation patient placed back in four-point restraints. Psychiatry recommending inpatient psychiatric admission/neuropsych inpatient admission once medically stable.  Social work consulted.  Psychiatry following and I appreciate the input and recommendations.  2.  Chronic atrial fibrillation Continue Lopressor for rate control.  Eliquis for anticoagulation. Outpatient follow-up with cardiology for further discussions on ongoing anticoagulation due to history of progressive worsening  dementia.   3.  Prolonged QTC Improved.  Potassium 3.9, magnesium 1.9 (08/04/2020).  Follow.   4.  Type 2 diabetes mellitus well controlled Hemoglobin A1c 6.9 (06/30/2020).  CBG 104 this morning.  Sliding scale insulin.   5.  Hypothyroidism Synthroid.   6.  Hypertension Continue Lopressor, Norvasc.   7.  Depression/anxiety Continue Cymbalta, Ativan.  Psychiatry following.  8.  Vitamin B12 deficiency Continue vitamin B12 supplementation.  Outpatient follow-up.   9.  Vitamin D deficiency Continue vitamin D supplementation.  10.  History of ulcerative colitis Imuran.  Outpatient follow-up with GI.    DVT prophylaxis: Eliquis Code Status: DNR Family Communication: No family at bedside. Disposition:   Status is: Inpatient    Dispo:  Patient From: Home  Planned Disposition: Inpatient psychiatry/[facility]  Expected discharge date: 2 to 3 days.  Medically stable for discharge: No patient still in four-point restraints with agitation.        Consultants:   Psychiatry: Dr. Dwyane Dee 08/03/2020, Dr. Toy Care 07/22/2020, Dr.Kumar, 07/14/2020    Procedures:   CT head 07/13/2020  Chest x-ray 07/13/2020, 07/30/2020  Antimicrobials:  Anti-infectives (From admission, onward)   Start     Dose/Rate Route Frequency Ordered Stop   07/14/20 0445  valACYclovir (VALTREX) tablet 1,000 mg  Status:  Discontinued       Note to Pharmacy: dermatonal shingles   1,000 mg Oral 3 times daily 07/14/20 0357 07/17/20 1007       Subjective: Patient alert.  In four-point restraints.  Per nurse tech patient noted to be agitated and aggressive early on this morning and hitting at the staff.  Patient noted per RN to have been in Posey belt overnight and was able to wiggle his way out of it and noted to be agitated with some aggressiveness overnight.  Patient calm this morning.  Denies chest pain or shortness of breath.   Objective: Vitals:   08/05/20 1335 08/05/20 1339 08/05/20 2156 08/06/20  0450  BP: (!) 115/94 (!) 115/94 (!) 127/99 131/80  Pulse: 74 74 (!) 102 94  Resp: 18 19 20 18   Temp: 98.1 F (36.7 C) 98.1 F (36.7 C) 97.6 F (36.4 C) 99.3 F (37.4 C)  TempSrc: Oral Oral Oral Oral  SpO2: 96% 95% 92% 98%  Weight:      Height:        Intake/Output Summary (Last 24 hours) at 08/06/2020 1113 Last data filed at 08/05/2020 1500 Gross per 24 hour  Intake 240 ml  Output --  Net 240 ml   Filed Weights   07/20/20 0922  Weight: 115.2 kg    Examination:  General exam: In four-point restraints.  Alert. Respiratory system: CTA B anterior lung fields.  No wheezes, no crackles, no rhonchi.  Normal respiratory effort..  Cardiovascular system: Regular rate rhythm no murmurs rubs or gallops.  No JVD.  No lower extremity edema.    Gastrointestinal system: Abdomen is soft, nontender, nondistended, positive bowel sounds.  No rebound.  No guarding. Central nervous system: Alert.  Moving extremities spontaneously.  No focal neurological deficits. Extremities: Symmetric 5 x 5 power. Skin: No rashes, lesions or ulcers Psychiatry: Judgement and insight appear poor. Mood & affect appropriate.     Data Reviewed: I have personally reviewed  following labs and imaging studies  CBC: Recent Labs  Lab 07/31/20 0338 08/03/20 0423 08/04/20 0356  WBC 11.3* 7.8 8.6  NEUTROABS  --   --  6.6  HGB 16.0 15.2 15.5  HCT 48.0 45.6 45.9  MCV 101.3* 100.9* 98.5  PLT 221 182 940    Basic Metabolic Panel: Recent Labs  Lab 07/31/20 0338 08/02/20 0354 08/03/20 0423 08/04/20 0356  NA 144  --  139 139  K 4.2  --  3.4* 3.9  CL 104  --  98 103  CO2 26  --  30 26  GLUCOSE 121*  --  106* 122*  BUN 20  --  15 14  CREATININE 1.15  --  1.02 1.00  CALCIUM 9.6  --  9.1 9.3  MG  --  2.0  --  1.9    GFR: Estimated Creatinine Clearance: 82.1 mL/min (by C-G formula based on SCr of 1 mg/dL).  Liver Function Tests: No results for input(s): AST, ALT, ALKPHOS, BILITOT, PROT, ALBUMIN in the  last 168 hours.  CBG: Recent Labs  Lab 08/05/20 0748 08/05/20 1126 08/05/20 1619 08/05/20 2200 08/06/20 0711  GLUCAP 112* 107* 129* 90 104*     No results found for this or any previous visit (from the past 240 hour(s)).       Radiology Studies: No results found.      Scheduled Meds: . amLODipine  5 mg Oral Daily  . apixaban  5 mg Oral BID  . atorvastatin  20 mg Oral Daily  . azaTHIOprine  200 mg Oral Daily  . divalproex  500 mg Oral QHS  . divalproex  500 mg Oral Daily  . DULoxetine  60 mg Oral Daily  . feeding supplement  237 mL Oral BID BM  . insulin aspart  0-9 Units Subcutaneous TID WC  . levothyroxine  25 mcg Oral Q0600  . melatonin  3 mg Oral QHS  . metoprolol tartrate  50 mg Oral BID  . OLANZapine  2.5 mg Oral Daily  . OLANZapine  5 mg Oral QHS  . vitamin B-12  1,000 mcg Oral Daily  . Vitamin D (Ergocalciferol)  50,000 Units Oral Q7 days   Continuous Infusions:   LOS: 23 days    Time spent: 35 mins    Irine Seal, MD [facility]   To contact the attending provider between 7A-7P or the covering provider during after hours 7P-7A, please log into the web site [email or web address] and access using universal [place] password for that web site. If you do not have the password, please call the hospital operator.  08/06/2020, 11:13 AM

## 2020-08-06 NOTE — Progress Notes (Signed)
Pt had urinated on themselves. Told pt that he would have to be cleaned. This Probation officer plus the tech on the floor washed pt, but pt became very aggressive when turned to one side. Pt attempted to hit one of the staff members then when reminded that we were simply cleaning him up, pt yelled "Get out of my property!". Reminded pt that we were in the hospital. Through the entire time, pt would slap, be verbally aggressive using words such as "bi&ch and a*shole", and would not allow himself to be turned. When completed with bath, pt stated "I am exhausted, but I have to get in my truck.". Will continue to monitor pt.

## 2020-08-06 NOTE — Progress Notes (Signed)
Despite pt having a waist belt and wrist restraints on, was able to twist onto his stomach and ended up on his knees at the bedside still with wrist restraints on & belt was up under his arms. Security was called and assisted nursing staff in getting  pt back in bed. Pt is kicking at staff and yelling. TRH X Blount NP was informed and a new restraint order obtained for soft ankle & wrist restraints. These have been applied per order. Sitter reamains at bedside

## 2020-08-06 NOTE — Progress Notes (Signed)
This sitter was told from previous sitter that the pt took out his dentures and then dropped them on the floor. When this sitter looked around the room, only half of his denture was on the table. The other half was on the floor. Both pieces washed and put into a denture case. RN Mollie Germany notified about pt breaking his dentures. Will continue to monitor pt.

## 2020-08-06 NOTE — Progress Notes (Signed)
Pt.s wife has arrived to visit her husband. Will continue to monitor pt.

## 2020-08-07 DIAGNOSIS — F0391 Unspecified dementia with behavioral disturbance: Secondary | ICD-10-CM | POA: Diagnosis not present

## 2020-08-07 DIAGNOSIS — F411 Generalized anxiety disorder: Secondary | ICD-10-CM | POA: Diagnosis not present

## 2020-08-07 DIAGNOSIS — F339 Major depressive disorder, recurrent, unspecified: Secondary | ICD-10-CM | POA: Diagnosis not present

## 2020-08-07 DIAGNOSIS — R410 Disorientation, unspecified: Secondary | ICD-10-CM | POA: Diagnosis not present

## 2020-08-07 DIAGNOSIS — E039 Hypothyroidism, unspecified: Secondary | ICD-10-CM | POA: Diagnosis not present

## 2020-08-07 DIAGNOSIS — R9431 Abnormal electrocardiogram [ECG] [EKG]: Secondary | ICD-10-CM | POA: Diagnosis not present

## 2020-08-07 DIAGNOSIS — E876 Hypokalemia: Secondary | ICD-10-CM | POA: Diagnosis not present

## 2020-08-07 DIAGNOSIS — I4891 Unspecified atrial fibrillation: Secondary | ICD-10-CM | POA: Diagnosis not present

## 2020-08-07 DIAGNOSIS — I10 Essential (primary) hypertension: Secondary | ICD-10-CM | POA: Diagnosis not present

## 2020-08-07 LAB — GLUCOSE, CAPILLARY
Glucose-Capillary: 106 mg/dL — ABNORMAL HIGH (ref 70–99)
Glucose-Capillary: 96 mg/dL (ref 70–99)
Glucose-Capillary: 124 mg/dL — ABNORMAL HIGH (ref 70–99)
Glucose-Capillary: 103 mg/dL — ABNORMAL HIGH (ref 70–99)

## 2020-08-07 LAB — BASIC METABOLIC PANEL
Anion gap: 12 (ref 5–15)
BUN: 14 mg/dL (ref 8–23)
CO2: 27 mmol/L (ref 22–32)
Calcium: 9.1 mg/dL (ref 8.9–10.3)
Chloride: 104 mmol/L (ref 98–111)
Creatinine, Ser: 1.04 mg/dL (ref 0.61–1.24)
GFR, Estimated: >60 mL/min (ref >60)
Glucose, Bld: 107 mg/dL — ABNORMAL HIGH (ref 70–99)
Potassium: 3.7 mmol/L (ref 3.5–5.1)
Sodium: 143 mmol/L (ref 135–145)

## 2020-08-07 LAB — MAGNESIUM: Magnesium: 2 mg/dL (ref 1.7–2.4)

## 2020-08-07 LAB — VALPROIC ACID LEVEL: Valproic Acid Lvl: 37 ug/mL — ABNORMAL LOW (ref 50.0–100.0)

## 2020-08-07 NOTE — Progress Notes (Signed)
PROGRESS NOTE    [name] KERVIN Sr.  ZJQ:[id number] DOB: [birth year] DOA: 07/13/2020 PCP: [name], DO    Chief Complaint  Patient presents with  . increase confusion    Brief Narrative:  69 year old gentleman prior history of atrial fibrillation on anticoagulation, dementia and depression brought to ED for increasing agitation.  As per the family he has been progressively having worsening dementia.  Multiple medications have been attempted in the outpatient setting without any improvement.  His initial CT of the head on admission was negative for acute findings without any evidence of acute infarction.  Patient is currently on Depakote ER to 250 mg during the day and 500 at bedtime along with Seroquel 50 mg daily. No Improvement with Seroquel, which was discontinued and changed to Zyprexa 5 mg at bedtime. He is not stable for discharge as he either agitated and is requiring 4 point restraints or sedated from the mediations.  He will require 24 hours supervision and assistance. Discussed the plan with family at bedside.  Wife is in the process of initiating medicaid for him and is looking for long term residence.  PT Evaluation ordered but they couldn't work with him as he has been in restraints.  Over the weekend he has had periods where he was off restraints and walked with the nursing staff in the hallways for a few hours.  .    Assessment & Plan:   Active Problems:   Major depression, recurrent, chronic (HCC)   Essential hypertension   GAD (generalized anxiety disorder)   Dementia with behavioral disturbance (HCC)   Atrial fibrillation with rapid ventricular response (HCC)   Hypokalemia   AMS (altered mental status)   Acute delirium   Hypothyroidism   Vitamin B 12 deficiency  1 acute delirium superimposed on progressive chronic dementia Precipitated by recent death in the family.  Head CT done negative for any acute abnormalities.  MRI unable to be done as patient not  cooperative.  Patient with no focal neurological symptoms.  Unlikely MRI would change treatment plan at this time.  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  Alcohol level negative on admission.  Ammonia level normal.  TSH normal.  Thiamine normal.  Cortisol levels normal.  RPR nonreactive.  Patient with low vitamin B12 which is being supplemented.  Patient was seen in consultation by psychiatry who initially recommended medical management.  Patient started on Seroquel 25 mg nightly which has been increased to 50 mg without any significant improvement.  Seroquel was discontinued and patient started on Zyprexa 5 mg at bedtime.  Patient was  also on Depakote 250 mg in the morning and 500 mg at bedtime. Patient with agitation and aggressiveness still requiring four-point restraints. Due to ongoing agitation psychiatry reconsulted and patient seen by psychiatry who recommended adjustment of medications.  Depakote has been increased to 500 mg twice daily.  Psychiatry recommended Zyprexa 2.5 mg in the morning and 5 mg nightly.  Psychiatry has placed the order for valproic acid level for 08/07/2020 with target goal of 80-100.  Due to ongoing agitation Zyprexa increased to 5 mg twice daily.   Psychiatry also recommended Posey belt to help reduce use of restraints, decrease simulation and offer a more calm safe environment for the patient.  Posey belt ordered however due to increased agitation patient placed back in four-point restraints. Psychiatry recommending inpatient psychiatric admission/neuropsych inpatient admission once medically stable.  Social work consulted.  Psychiatry following and I appreciate the input and recommendations.  2.  Chronic atrial fibrillation Lopressor for rate control.  Eliquis for anticoagulation.  Outpatient follow-up with cardiology for further discussions on ongoing anticoagulation due to history of progressive worsening dementia.   3.  Prolonged QTC Improved.  Potassium 3.9,  magnesium 1.9 (08/04/2020).  Follow.   4.  Type 2 diabetes mellitus well controlled Hemoglobin A1c 6.9 (06/30/2020).  CBG 106 this morning.  Sliding scale insulin.  5.  Hypothyroidism Synthroid.   6.  Hypertension Controlled on current regimen of Norvasc and Lopressor.  7.  Depression/anxiety Continue Cymbalta, Ativan.  Psychiatry following.   8.  Vitamin B12 deficiency Continue vitamin B12 supplementation.  Outpatient follow-up.   9.  Vitamin D deficiency Vitamin D supplementation.  10.  History of ulcerative colitis Continue Imuran.  Outpatient follow-up with GI.   DVT prophylaxis: Eliquis Code Status: DNR Family Communication: No family at bedside. Disposition:   Status is: Inpatient    Dispo:  Patient From: Home  Planned Disposition: Inpatient psychiatry/[facility] psych  Expected discharge date: To be determined, once patient no longer requiring four-point restraints.  Medically stable for discharge: No patient still in four-point restraints with agitation.        Consultants:   Psychiatry: Dr. Dwyane Dee 08/03/2020, Dr. Toy Care 07/22/2020, Dr.Kumar, 07/14/2020    Procedures:   CT head 07/13/2020  Chest x-ray 07/13/2020, 07/30/2020  Antimicrobials:  Anti-infectives (From admission, onward)   Start     Dose/Rate Route Frequency Ordered Stop   07/14/20 0445  valACYclovir (VALTREX) tablet 1,000 mg  Status:  Discontinued       Note to Pharmacy: dermatonal shingles   1,000 mg Oral 3 times daily 07/14/20 0357 07/17/20 1007       Subjective: Patient asleep.  In four-point restraints.  Nurse tech at bedside.  Per nurse tech patient with no significant agitation this morning.  Per nurse tech patient confused.  Objective: Vitals:   08/06/20 0450 08/06/20 1242 08/06/20 2143 08/07/20 0500  BP: 131/80 (!) 148/75 120/62 129/84  Pulse: 94 76 62 (!) 109  Resp: 18 16 16 16   Temp: 99.3 F (37.4 C) 99.3 F (37.4 C) 98.4 F (36.9 C) 97.6 F (36.4 C)  TempSrc: Oral Oral  Oral Oral  SpO2: 98% 98% 95% 95%  Weight:      Height:        Intake/Output Summary (Last 24 hours) at 08/07/2020 1141 Last data filed at 08/07/2020 9485 Gross per 24 hour  Intake --  Output 250 ml  Net -250 ml   Filed Weights   07/20/20 0922  Weight: 115.2 kg    Examination:  General exam: Asleep.  In four-point restraints. Respiratory system: Lungs clear to auscultation bilaterally anterior lung fields.  No wheezes, no crackles, no rhonchi.  Normal respiratory effort.  Cardiovascular system: RRR no murmurs rubs or gallops.  No JVD.  No lower extremity edema.    Gastrointestinal system: Abdomen is soft, nontender, nondistended, positive bowel sounds.  No rebound.  No guarding. Central nervous system: Asleep..  Moving extremities spontaneously.  No focal neurological deficits. Extremities: Symmetric 5 x 5 power. Skin: No rashes, lesions or ulcers Psychiatry: Judgement and insight appear poor. Mood & affect appropriate.     Data Reviewed: I have personally reviewed following labs and imaging studies  CBC: Recent Labs  Lab 08/03/20 0423 08/04/20 0356  WBC 7.8 8.6  NEUTROABS  --  6.6  HGB 15.2 15.5  HCT 45.6 45.9  MCV 100.9* 98.5  PLT 182 218    Basic  Metabolic Panel: Recent Labs  Lab 08/02/20 0354 08/03/20 0423 08/04/20 0356 08/07/20 0529  NA  --  139 139 143  K  --  3.4* 3.9 3.7  CL  --  98 103 104  CO2  --  30 26 27   GLUCOSE  --  106* 122* 107*  BUN  --  15 14 14   CREATININE  --  1.02 1.00 1.04  CALCIUM  --  9.1 9.3 9.1  MG 2.0  --  1.9 2.0    GFR: Estimated Creatinine Clearance: 79 mL/min (by C-G formula based on SCr of 1.04 mg/dL).  Liver Function Tests: No results for input(s): AST, ALT, ALKPHOS, BILITOT, PROT, ALBUMIN in the last 168 hours.  CBG: Recent Labs  Lab 08/06/20 0711 08/06/20 1203 08/06/20 1613 08/06/20 1959 08/07/20 0715  GLUCAP 104* 117* 157* 108* 106*     No results found for this or any previous visit (from the past  240 hour(s)).       Radiology Studies: No results found.      Scheduled Meds: . amLODipine  5 mg Oral Daily  . apixaban  5 mg Oral BID  . atorvastatin  20 mg Oral Daily  . azaTHIOprine  200 mg Oral Daily  . divalproex  500 mg Oral QHS  . divalproex  500 mg Oral Daily  . DULoxetine  60 mg Oral Daily  . feeding supplement  237 mL Oral BID BM  . insulin aspart  0-9 Units Subcutaneous TID WC  . levothyroxine  25 mcg Oral Q0600  . melatonin  3 mg Oral QHS  . metoprolol tartrate  50 mg Oral BID  . OLANZapine  5 mg Oral BID  . vitamin B-12  1,000 mcg Oral Daily  . Vitamin D (Ergocalciferol)  50,000 Units Oral Q7 days   Continuous Infusions:   LOS: 24 days    Time spent: 35 mins    Irine Seal, MD [facility]   To contact the attending provider between 7A-7P or the covering provider during after hours 7P-7A, please log into the web site [email or web address] and access using universal [place] password for that web site. If you do not have the password, please call the hospital operator.  08/07/2020, 11:41 AM

## 2020-08-07 NOTE — Progress Notes (Signed)
Pt.s brother, Rush Landmark, is here to visit pt. Will continue to monitor pt.

## 2020-08-08 DIAGNOSIS — E039 Hypothyroidism, unspecified: Secondary | ICD-10-CM | POA: Diagnosis not present

## 2020-08-08 DIAGNOSIS — R9431 Abnormal electrocardiogram [ECG] [EKG]: Secondary | ICD-10-CM | POA: Diagnosis not present

## 2020-08-08 DIAGNOSIS — R410 Disorientation, unspecified: Secondary | ICD-10-CM | POA: Diagnosis not present

## 2020-08-08 DIAGNOSIS — F0391 Unspecified dementia with behavioral disturbance: Secondary | ICD-10-CM | POA: Diagnosis not present

## 2020-08-08 DIAGNOSIS — F339 Major depressive disorder, recurrent, unspecified: Secondary | ICD-10-CM | POA: Diagnosis not present

## 2020-08-08 DIAGNOSIS — F411 Generalized anxiety disorder: Secondary | ICD-10-CM | POA: Diagnosis not present

## 2020-08-08 DIAGNOSIS — E876 Hypokalemia: Secondary | ICD-10-CM | POA: Diagnosis not present

## 2020-08-08 DIAGNOSIS — I4891 Unspecified atrial fibrillation: Secondary | ICD-10-CM | POA: Diagnosis not present

## 2020-08-08 DIAGNOSIS — I10 Essential (primary) hypertension: Secondary | ICD-10-CM | POA: Diagnosis not present

## 2020-08-08 LAB — GLUCOSE, CAPILLARY
Glucose-Capillary: 133 mg/dL — ABNORMAL HIGH (ref 70–99)
Glucose-Capillary: 123 mg/dL — ABNORMAL HIGH (ref 70–99)
Glucose-Capillary: 116 mg/dL — ABNORMAL HIGH (ref 70–99)
Glucose-Capillary: 170 mg/dL — ABNORMAL HIGH (ref 70–99)

## 2020-08-08 LAB — CBC
HCT: 42.7 % (ref 39.0–52.0)
Hemoglobin: 14.5 g/dL (ref 13.0–17.0)
MCH: 33.6 pg (ref 26.0–34.0)
MCHC: 34 g/dL (ref 30.0–36.0)
MCV: 98.8 fL (ref 80.0–100.0)
Platelets: 180 10*3/uL (ref 150–400)
RBC: 4.32 MIL/uL (ref 4.22–5.81)
RDW: 15.2 % (ref 11.5–15.5)
WBC: 6.7 10*3/uL (ref 4.0–10.5)
nRBC: 0 % (ref 0.0–0.2)

## 2020-08-08 LAB — BASIC METABOLIC PANEL
Anion gap: 10 (ref 5–15)
BUN: 14 mg/dL (ref 8–23)
CO2: 28 mmol/L (ref 22–32)
Calcium: 9 mg/dL (ref 8.9–10.3)
Chloride: 103 mmol/L (ref 98–111)
Creatinine, Ser: 1.05 mg/dL (ref 0.61–1.24)
GFR, Estimated: >60 mL/min (ref >60)
Glucose, Bld: 104 mg/dL — ABNORMAL HIGH (ref 70–99)
Potassium: 3.7 mmol/L (ref 3.5–5.1)
Sodium: 141 mmol/L (ref 135–145)

## 2020-08-08 MED ORDER — HALOPERIDOL LACTATE 5 MG/ML IJ SOLN
5.0000 mg | Freq: Once | INTRAMUSCULAR | Status: AC
  Administered 2020-08-08: 5 mg via INTRAVENOUS
  Filled 2020-08-08: qty 1

## 2020-08-08 NOTE — Social Work (Signed)
MCEDCSW covering remotely received call from Amy at [facility] regarding Pt. [name] is requesting more information regarding Pt's medical clearance status. Also they would like a synopsis of the Pt's condition from 11/03 to 11/23.  They feel that they information they received did not give details about Pt's full medical conditions and changes during that time period.  Amy reports that she will continue to work with her team to see if they can offer a bed, but they need more details and would like a call back tomorrow, 08/09/20 with more information abut patient.
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
## 2020-08-08 NOTE — TOC Progression Note (Addendum)
Transition of Care (TOC) - Progression Note    Patient Details  Name: AYIDEN MILLIMAN Sr. MRN: [id number] Date of Birth: 10/04/1950  Transition of Care [facility] CM/SW [place], [place] Phone Number: 08/08/2020, 3:45 PM  Clinical Narrative:   Behavioral health inpatient setting referral recommended by psychiatry consult last Wednesday afternoon.  Spoke with Lilia Pro at [place] who stated 4 point restraints do not automatically rule someone out.  Referral FAXed.  IVC reinitiated today.  Magistrate Eden Lathe returned Findings and Custody order, awaiting service of same by [employer].  IVC due again on 11/5.   TOC will continue to follow during the course of hospitalization.     Expected Discharge Plan: Psychiatric Hospital Barriers to Discharge: Psych Bed not available  Expected Discharge Plan and Services Expected Discharge Plan: Psychiatric Hospital In-house Referral: Clinical Social Work Discharge Planning Services: CM Consult Post Acute Care Choice: [place] arrangements for the past 2 months: Single Family Home                                       Social Determinants of Health (SDOH) Interventions    Readmission Risk Interventions Readmission Risk Prevention Plan 02/18/2019  Transportation Screening Complete  PCP or Specialist Appt within 3-5 Days Complete  HRI or [place] Complete  Social Work Consult for [place] Planning/Counseling Complete  Palliative Care Screening Not Applicable  Medication Review Press photographer) Complete  Some recent data might be hidden

## 2020-08-09 DIAGNOSIS — R410 Disorientation, unspecified: Secondary | ICD-10-CM | POA: Diagnosis not present

## 2020-08-09 DIAGNOSIS — E876 Hypokalemia: Secondary | ICD-10-CM | POA: Diagnosis not present

## 2020-08-09 DIAGNOSIS — I4891 Unspecified atrial fibrillation: Secondary | ICD-10-CM | POA: Diagnosis not present

## 2020-08-09 DIAGNOSIS — F0391 Unspecified dementia with behavioral disturbance: Secondary | ICD-10-CM | POA: Diagnosis not present

## 2020-08-09 DIAGNOSIS — R9431 Abnormal electrocardiogram [ECG] [EKG]: Secondary | ICD-10-CM | POA: Diagnosis not present

## 2020-08-09 DIAGNOSIS — E039 Hypothyroidism, unspecified: Secondary | ICD-10-CM | POA: Diagnosis not present

## 2020-08-09 DIAGNOSIS — F411 Generalized anxiety disorder: Secondary | ICD-10-CM | POA: Diagnosis not present

## 2020-08-09 DIAGNOSIS — F339 Major depressive disorder, recurrent, unspecified: Secondary | ICD-10-CM | POA: Diagnosis not present

## 2020-08-09 DIAGNOSIS — I10 Essential (primary) hypertension: Secondary | ICD-10-CM | POA: Diagnosis not present

## 2020-08-09 LAB — GLUCOSE, CAPILLARY
Glucose-Capillary: 102 mg/dL — ABNORMAL HIGH (ref 70–99)
Glucose-Capillary: 106 mg/dL — ABNORMAL HIGH (ref 70–99)
Glucose-Capillary: 110 mg/dL — ABNORMAL HIGH (ref 70–99)
Glucose-Capillary: 124 mg/dL — ABNORMAL HIGH (ref 70–99)
Glucose-Capillary: 105 mg/dL — ABNORMAL HIGH (ref 70–99)

## 2020-08-09 NOTE — Progress Notes (Signed)
PROGRESS NOTE    [name] THAYNE Sr.  VWU:[id number] DOB: [birth year] DOA: 07/13/2020 PCP: [name], DO    Chief Complaint  Patient presents with   increase confusion    Brief Narrative:  69 year old gentleman prior history of atrial fibrillation on anticoagulation, dementia and depression brought to ED for increasing agitation.  As per the family he has been progressively having worsening dementia.  Multiple medications have been attempted in the outpatient setting without any improvement.  His initial CT of the head on admission was negative for acute findings without any evidence of acute infarction.  Patient is currently on Depakote ER to 250 mg during the day and 500 at bedtime along with Seroquel 50 mg daily. No Improvement with Seroquel, which was discontinued and changed to Zyprexa 5 mg at bedtime. He is not stable for discharge as he either agitated and is requiring 4 point restraints or sedated from the mediations.  He will require 24 hours supervision and assistance. Discussed the plan with family at bedside.  Wife is in the process of initiating medicaid for him and is looking for long term residence.  PT Evaluation ordered but they couldn't work with him as he has been in restraints.  Over the weekend he has had periods where he was off restraints and walked with the nursing staff in the hallways for a few hours.  .    Assessment & Plan:   Active Problems:   Major depression, recurrent, chronic (HCC)   Essential hypertension   GAD (generalized anxiety disorder)   Dementia with behavioral disturbance (HCC)   Atrial fibrillation with rapid ventricular response (HCC)   Hypokalemia   AMS (altered mental status)   Acute delirium   Hypothyroidism   Vitamin B 12 deficiency  1 acute delirium superimposed on progressive chronic dementia Precipitated by recent death in the family.  Head CT done negative for any acute abnormalities.  MRI unable to be done as patient not  cooperative.  Patient with no focal neurological symptoms.  Unlikely MRI would change treatment plan at this time.  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  Alcohol level negative on admission.  Ammonia level normal.  TSH normal.  Thiamine normal.  Cortisol levels normal.  RPR nonreactive.  Patient with low vitamin B12 which is being supplemented.  Patient was seen in consultation by psychiatry who initially recommended medical management.  Patient started on Seroquel 25 mg nightly which has been increased to 50 mg without any significant improvement.  Seroquel was discontinued and patient started on Zyprexa 5 mg at bedtime.  Patient was  also on Depakote 250 mg in the morning and 500 mg at bedtime. Patient with agitation and aggressiveness still requiring four-point restraints. Due to ongoing agitation psychiatry reconsulted and patient seen by psychiatry who recommended adjustment of medications.  Depakote has been increased to 500 mg twice daily.  Psychiatry recommended Zyprexa 2.5 mg in the morning and 5 mg nightly.  Psychiatry has placed the order for valproic acid level for 08/07/2020 with target goal of 80-100.  Due to ongoing agitation Zyprexa increased to 5 mg twice daily.   Psychiatry also recommended Posey belt to help reduce use of restraints, decrease simulation and offer a more calm safe environment for the patient.  Posey belt ordered however due to increased agitation and aggressiveness, patient placed back in four-point restraints. Psychiatry recommending inpatient psychiatric admission/neuropsych inpatient admission once medically stable.  Patient currently medically stable.  Social work consulted.  Psychiatry following and  I appreciate the input and recommendations.  2.  Chronic atrial fibrillation Lopressor for rate control.  Eliquis for anticoagulation.  Outpatient follow-up with cardiology for further discussions on ongoing anticoagulation due to history of progressive worsening  dementia.  3.  Prolonged QTC Improved.  Potassium at 3.7.  Magnesium was 2 (08/07/2020).  4.  Type 2 diabetes mellitus well controlled Hemoglobin A1c 6.9 (06/30/2020).  CBG 106.  Sliding scale insulin.   5.  Hypothyroidism Synthroid.   6.  Hypertension Controlled on current regimen of Norvasc, Lopressor.    7.  Depression/anxiety Continue Ativan, Cymbalta.  Psychiatry following.  Needs inpatient psych admission.    8.  Vitamin B12 deficiency Continue vitamin B12 supplementation.   9.  Vitamin D deficiency Vitamin D supplementation.   10.  History of ulcerative colitis Continue Imuran.  Will need outpatient follow-up with GI.    DVT prophylaxis: Eliquis Code Status: DNR Family Communication: No family at bedside. Disposition:   Status is: Inpatient    Dispo:  Patient From: Home  Planned Disposition: Inpatient psychiatry/[facility]  Expected discharge date: To be determined, when bed available.  Medically stable for discharge: Yes        Consultants:   Psychiatry: Dr. Dwyane Dee 08/03/2020, Dr. Toy Care 07/22/2020, Dr.Kumar, 07/14/2020    Procedures:   CT head 07/13/2020  Chest x-ray 07/13/2020, 07/30/2020  Antimicrobials:  Anti-infectives (From admission, onward)   Start     Dose/Rate Route Frequency Ordered Stop   07/14/20 0445  valACYclovir (VALTREX) tablet 1,000 mg  Status:  Discontinued       Note to Pharmacy: dermatonal shingles   1,000 mg Oral 3 times daily 07/14/20 0357 07/17/20 1007       Subjective: Patient alert.  In four-point restraints.  No chest pain.  No shortness of breath.  Nurse tech at bedside.    Objective: Vitals:   08/08/20 1706 08/08/20 2134 08/09/20 0658 08/09/20 0951  BP: 139/88 (!) 152/73 (!) 113/58 119/80  Pulse: 81 82 92 63  Resp: 16 19 20    Temp: 98.3 F (36.8 C) 97.6 F (36.4 C) 97.9 F (36.6 C)   TempSrc:      SpO2: 98% 92% 91% 98%  Weight:      Height:        Intake/Output Summary (Last 24 hours) at 08/09/2020  1102 Last data filed at 08/08/2020 2025 Gross per 24 hour  Intake 598 ml  Output --  Net 598 ml   Filed Weights   07/20/20 0922  Weight: 115.2 kg    Examination:  General exam: In four-point restraints.  Awake. Respiratory system: Lungs clear to auscultation bilaterally anterior lung fields.  No wheezes, no crackles, no rhonchi.  Normal respiratory effort.  Cardiovascular system: RRR no murmurs rubs or gallops.  No JVD.  No lower extremity edema.  Gastrointestinal system: Abdomen is soft, nontender, nondistended, positive bowel sounds.  No rebound.  No guarding.  Central nervous system: Moving extremities spontaneously.  No focal neurological deficits. Extremities: Symmetric 5 x 5 power. Skin: No rashes, lesions or ulcers Psychiatry: Judgement and insight appear poor. Mood & affect appropriate.     Data Reviewed: I have personally reviewed following labs and imaging studies  CBC: Recent Labs  Lab 08/03/20 0423 08/04/20 0356 08/08/20 0340  WBC 7.8 8.6 6.7  NEUTROABS  --  6.6  --   HGB 15.2 15.5 14.5  HCT 45.6 45.9 42.7  MCV 100.9* 98.5 98.8  PLT 182 218 180    Basic  Metabolic Panel: Recent Labs  Lab 08/03/20 0423 08/04/20 0356 08/07/20 0529 08/08/20 0340  NA 139 139 143 141  K 3.4* 3.9 3.7 3.7  CL 98 103 104 103  CO2 30 26 27 28   GLUCOSE 106* 122* 107* 104*  BUN 15 14 14 14   CREATININE 1.02 1.00 1.04 1.05  CALCIUM 9.1 9.3 9.1 9.0  MG  --  1.9 2.0  --     GFR: Estimated Creatinine Clearance: 78.2 mL/min (by C-G formula based on SCr of 1.05 mg/dL).  Liver Function Tests: No results for input(s): AST, ALT, ALKPHOS, BILITOT, PROT, ALBUMIN in the last 168 hours.  CBG: Recent Labs  Lab 08/08/20 0745 08/08/20 1144 08/08/20 1744 08/08/20 2142 08/09/20 0753  GLUCAP 133* 123* 116* 170* 102*     No results found for this or any previous visit (from the past 240 hour(s)).       Radiology Studies: No results found.      Scheduled Meds:   amLODipine  5 mg Oral Daily   apixaban  5 mg Oral BID   atorvastatin  20 mg Oral Daily   azaTHIOprine  200 mg Oral Daily   divalproex  500 mg Oral QHS   divalproex  500 mg Oral Daily   DULoxetine  60 mg Oral Daily   feeding supplement  237 mL Oral BID BM   insulin aspart  0-9 Units Subcutaneous TID WC   levothyroxine  25 mcg Oral Q0600   melatonin  3 mg Oral QHS   metoprolol tartrate  50 mg Oral BID   OLANZapine  5 mg Oral BID   vitamin B-12  1,000 mcg Oral Daily   Vitamin D (Ergocalciferol)  50,000 Units Oral Q7 days   Continuous Infusions:   LOS: 26 days    Time spent: 35 mins    Irine Seal, MD [facility]   To contact the attending provider between 7A-7P or the covering provider during after hours 7P-7A, please log into the web site [email or web address] and access using universal [place] password for that web site. If you do not have the password, please call the hospital operator.  08/09/2020, 11:02 AM

## 2020-08-10 DIAGNOSIS — F0391 Unspecified dementia with behavioral disturbance: Secondary | ICD-10-CM | POA: Diagnosis not present

## 2020-08-10 DIAGNOSIS — R410 Disorientation, unspecified: Secondary | ICD-10-CM | POA: Diagnosis not present

## 2020-08-10 DIAGNOSIS — R4182 Altered mental status, unspecified: Secondary | ICD-10-CM | POA: Diagnosis not present

## 2020-08-10 LAB — GLUCOSE, CAPILLARY
Glucose-Capillary: 101 mg/dL — ABNORMAL HIGH (ref 70–99)
Glucose-Capillary: 104 mg/dL — ABNORMAL HIGH (ref 70–99)
Glucose-Capillary: 151 mg/dL — ABNORMAL HIGH (ref 70–99)

## 2020-08-10 MED ORDER — HALOPERIDOL LACTATE 5 MG/ML IJ SOLN
2.5000 mg | Freq: Once | INTRAMUSCULAR | Status: AC
  Administered 2020-08-10: 2.5 mg via INTRAVENOUS
  Filled 2020-08-10: qty 1

## 2020-08-10 MED ORDER — DIVALPROEX SODIUM 125 MG PO CSDR
750.0000 mg | DELAYED_RELEASE_CAPSULE | Freq: Every day | ORAL | Status: DC
  Administered 2020-08-10 – 2020-08-30 (×21): 750 mg via ORAL
  Filled 2020-08-10 (×21): qty 6

## 2020-08-10 NOTE — Progress Notes (Signed)
08/10/2020  1318  Called patient's wife [phone] and gave her an update.

## 2020-08-10 NOTE — Plan of Care (Signed)
  Problem: Safety: Goal: Ability to remain free from injury will improve Outcome: Progressing   Problem: Health Behavior/Discharge Planning: Goal: Ability to identify changes in lifestyle to reduce recurrence of condition will improve Outcome: Progressing   Problem: Clinical Measurements: Goal: Will remain free from infection Outcome: Progressing   Problem: Coping: Goal: Level of anxiety will decrease Outcome: Progressing

## 2020-08-10 NOTE — Progress Notes (Signed)
Patient very agitated, restless, and trying to get out of the bed. No prn to administer, on-call attending notified. Will continue to monitor the patient.
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
## 2020-08-10 NOTE — Progress Notes (Signed)
PROGRESS NOTE    [name] MATT Sr.  GLO:[id number] DOB: 1951-07-29 DOA: 07/13/2020 PCP: [name], DO    Chief Complaint  Patient presents with  . increase confusion    Brief Narrative:  69 year old gentleman prior history of atrial fibrillation on anticoagulation, dementia and depression brought to ED for increasing agitation.  As per the family he has been progressively having worsening dementia.  Multiple medications have been attempted in the outpatient setting without any improvement.  His initial CT of the head on admission was negative for acute findings without any evidence of acute infarction.  Patient is currently on Depakote ER to 250 mg during the day and 500 at bedtime along with Seroquel 50 mg daily. No Improvement with Seroquel, which was discontinued and changed to Zyprexa 5 mg at bedtime. He is not stable for discharge as he either agitated and is requiring 4 point restraints or sedated from the mediations.  He will require 24 hours supervision and assistance. Discussed the plan with family at bedside.  Wife is in the process of initiating medicaid for him and is looking for long term residence.  PT Evaluation ordered but they couldn't work with him as he has been in restraints.  Over the weekend he has had periods where he was off restraints and walked with the nursing staff in the hallways for a few hours.  .    Assessment & Plan:   Active Problems:   Major depression, recurrent, chronic (HCC)   Essential hypertension   GAD (generalized anxiety disorder)   Dementia with behavioral disturbance (HCC)   Atrial fibrillation with rapid ventricular response (HCC)   Hypokalemia   AMS (altered mental status)   Acute delirium   Hypothyroidism   Vitamin B 12 deficiency  1 acute delirium superimposed on progressive chronic dementia Precipitated by recent death in the family.  Head CT done negative for any acute abnormalities.  MRI unable to be done as patient not  cooperative.  Patient with no focal neurological symptoms.  Unlikely MRI would change treatment plan at this time.  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  Alcohol level negative on admission.  Ammonia level normal.  TSH normal.  Thiamine normal.  Cortisol levels normal.  RPR nonreactive.  Patient with low vitamin B12 which is being supplemented.  Patient was seen in consultation by psychiatry who initially recommended medical management.  Patient started on Seroquel 25 mg nightly which has been increased to 50 mg without any significant improvement.  Seroquel was discontinued and patient started on Zyprexa 5 mg at bedtime.  Patient was  also on Depakote 250 mg in the morning and 500 mg at bedtime. Patient with agitation and aggressiveness still requiring four-point restraints. Due to ongoing agitation psychiatry reconsulted and patient seen by psychiatry who recommended adjustment of medications.  Depakote has been increased to 500 mg twice daily.  Psychiatry recommended Zyprexa 2.5 mg in the morning and 5 mg nightly.  Psychiatry has placed the order for valproic acid level for 08/07/2020 with target goal of 80-100.  Due to ongoing agitation Zyprexa increased to 5 mg twice daily.   Psychiatry also recommended Posey belt to help reduce use of restraints, decrease simulation and offer a more calm safe environment for the patient.  Posey belt ordered however due to increased agitation and aggressiveness, patient placed back in four-point restraints. Psychiatry recommending inpatient psychiatric admission/neuropsych inpatient admission once medically stable.  Patient currently medically stable.  Social work consulted.  Psychiatry following  2.  Chronic atrial fibrillation Lopressor for rate control.  Eliquis for anticoagulation.  Outpatient follow-up with cardiology for further discussions on ongoing anticoagulation due to history of progressive worsening dementia.  3.  Prolonged QTC Improved.   Potassium at 3.7.  Magnesium was 2 (08/07/2020).  4.  Type 2 diabetes mellitus well controlled Hemoglobin A1c 6.9 (06/30/2020).  CBG 106.  Sliding scale insulin.   5.  Hypothyroidism Synthroid.   6.  Hypertension Controlled on current regimen of Norvasc, Lopressor.    7.  Depression/anxiety Continue Ativan, Cymbalta.  Psychiatry following.  Needs inpatient psych admission.    8.  Vitamin B12 deficiency Continue vitamin B12 supplementation.   9.  Vitamin D deficiency Vitamin D supplementation.   10.  History of ulcerative colitis Continue Imuran.  Will need outpatient follow-up with GI.    DVT prophylaxis: Eliquis Code Status: DNR Family Communication: No family at bedside. Disposition:   Status is: Inpatient    Dispo:  Patient From:    Planned Disposition: Inpatient psychiatry/[facility]  Expected discharge date: To be determined, when bed available.  Medically stable for discharge: Yes        Consultants:   Psychiatry: Dr. Dwyane Dee 08/03/2020, Dr. Toy Care 07/22/2020, Dr.Kumar, 07/14/2020    Procedures:   CT head 07/13/2020  Chest x-ray 07/13/2020, 07/30/2020  Antimicrobials:  Anti-infectives (From admission, onward)   Start     Dose/Rate Route Frequency Ordered Stop   07/14/20 0445  valACYclovir (VALTREX) tablet 1,000 mg  Status:  Discontinued       Note to Pharmacy: dermatonal shingles   1,000 mg Oral 3 times daily 07/14/20 0357 07/17/20 1007       Subjective: Patient alert.  In four-point restraints.  No chest pain.  No shortness of breath.  Nurse tech at bedside.    Objective: Vitals:   08/09/20 1958 08/10/20 0543 08/10/20 1013 08/10/20 1355  BP: (!) 117/59 98/61 (!) 106/91 139/81  Pulse: 92 98 61 79  Resp: 20 18 16 16   Temp: 97.8 F (36.6 C) 98.1 F (36.7 C) 98.4 F (36.9 C) 98.3 F (36.8 C)  TempSrc: Oral Oral Oral Oral  SpO2: 94% 92% 96% 100%  Weight:      Height:        Intake/Output Summary (Last 24 hours) at 08/10/2020 1743 Last  data filed at 08/10/2020 1457 Gross per 24 hour  Intake 992 ml  Output 925 ml  Net 67 ml   Filed Weights   07/20/20 0922  Weight: 115.2 kg    Examination:  General exam: In four-point restraints.  Sleeping. Respiratory system: Lungs clear to auscultation bilaterally anterior lung fields.  No wheezes, no crackles, no rhonchi.  Normal respiratory effort.  Cardiovascular system: RRR no murmurs rubs or gallops.  No JVD.  No lower extremity edema.  Gastrointestinal system: Abdomen is soft, nontender, nondistended, positive bowel sounds.  No rebound.  No guarding.  Central nervous system: Moving extremities spontaneously.  No focal neurological deficits. Extremities: Symmetric 5 x 5 power. Skin: No rashes, lesions or ulcers Psychiatry: Judgement and insight appear poor. Mood & affect appropriate.     Data Reviewed: I have personally reviewed following labs and imaging studies  CBC: Recent Labs  Lab 08/04/20 0356 08/08/20 0340  WBC 8.6 6.7  NEUTROABS 6.6  --   HGB 15.5 14.5  HCT 45.9 42.7  MCV 98.5 98.8  PLT 218 426    Basic Metabolic Panel: Recent Labs  Lab 08/04/20 0356 08/07/20 0529 08/08/20 0340  NA 139 143 141  K 3.9 3.7 3.7  CL 103 104 103  CO2 26 27 28   GLUCOSE 122* 107* 104*  BUN 14 14 14   CREATININE 1.00 1.04 1.05  CALCIUM 9.3 9.1 9.0  MG 1.9 2.0  --     GFR: Estimated Creatinine Clearance: 78.2 mL/min (by C-G formula based on SCr of 1.05 mg/dL).  Liver Function Tests: No results for input(s): AST, ALT, ALKPHOS, BILITOT, PROT, ALBUMIN in the last 168 hours.  CBG: Recent Labs  Lab 08/09/20 1626 08/09/20 1931 08/10/20 0743 08/10/20 1136 08/10/20 1646  GLUCAP 124* 105* 101* 104* 151*     No results found for this or any previous visit (from the past 240 hour(s)).       Radiology Studies: No results found.      Scheduled Meds: . amLODipine  5 mg Oral Daily  . apixaban  5 mg Oral BID  . atorvastatin  20 mg Oral Daily  .  azaTHIOprine  200 mg Oral Daily  . divalproex  500 mg Oral Daily  . divalproex  750 mg Oral QHS  . DULoxetine  60 mg Oral Daily  . feeding supplement  237 mL Oral BID BM  . insulin aspart  0-9 Units Subcutaneous TID WC  . levothyroxine  25 mcg Oral Q0600  . melatonin  3 mg Oral QHS  . metoprolol tartrate  50 mg Oral BID  . OLANZapine  5 mg Oral BID  . vitamin B-12  1,000 mcg Oral Daily  . Vitamin D (Ergocalciferol)  50,000 Units Oral Q7 days   Continuous Infusions:   LOS: 27 days    Time spent: 35 mins    Elie Confer, MD [facility] Pager: [phone]  To contact the attending provider between 7A-7P or the covering provider during after hours 7P-7A, please log into the web site [email or web address] and access using universal [place] password for that web site. If you do not have the password, please call the hospital operator.  08/10/2020, 5:43 PM

## 2020-08-10 NOTE — TOC Progression Note (Signed)
Transition of Care (TOC) - Progression Note    Patient Details  Name: [name]. MRN: [id number] Date of Birth: 06-03-1951  Transition of Care [facility]) CM/SW [place], [place] Phone Number: 08/10/2020, 2:39 PM  Clinical Narrative:   Looking for psychiatric bed for patient.  [place] states the MD will not approved patients who are requiring restraints. Mayer Camel is on 10 day quarantine due to [place] outbreak. [facility] C[facility] is reviewing updated clinicals.  [place] is reviewing referral. Spoke with wife and updated her.  TOC will continue to follow during the course of hospitalization.     Expected Discharge Plan: Psychiatric Hospital Barriers to Discharge: Psych Bed not available  Expected Discharge Plan and Services Expected Discharge Plan: Psychiatric Hospital In-house Referral: Clinical Social Work Discharge Planning Services: CM Consult Post Acute Care Choice: [place] arrangements for the past 2 months: Single Family Home                                       Social Determinants of Health (SDOH) Interventions    Readmission Risk Interventions Readmission Risk Prevention Plan 02/18/2019  Transportation Screening Complete  PCP or Specialist Appt within 3-5 Days Complete  HRI or [place] Complete  Social Work Consult for [place] Planning/Counseling Complete  Palliative Care Screening Not Applicable  Medication Review [employer]) Complete  Some recent data might be hidden

## 2020-08-11 DIAGNOSIS — R4182 Altered mental status, unspecified: Secondary | ICD-10-CM | POA: Diagnosis not present

## 2020-08-11 DIAGNOSIS — F0391 Unspecified dementia with behavioral disturbance: Secondary | ICD-10-CM | POA: Diagnosis not present

## 2020-08-11 DIAGNOSIS — R410 Disorientation, unspecified: Secondary | ICD-10-CM | POA: Diagnosis not present

## 2020-08-11 LAB — GLUCOSE, CAPILLARY
Glucose-Capillary: 107 mg/dL — ABNORMAL HIGH (ref 70–99)
Glucose-Capillary: 133 mg/dL — ABNORMAL HIGH (ref 70–99)
Glucose-Capillary: 113 mg/dL — ABNORMAL HIGH (ref 70–99)
Glucose-Capillary: 120 mg/dL — ABNORMAL HIGH (ref 70–99)

## 2020-08-11 NOTE — Progress Notes (Signed)
PROGRESS NOTE    [name] MCGOWAN Sr.  IHK:[id number] DOB: [birth year] DOA: 07/13/2020 PCP: [name], DO    Chief Complaint  Patient presents with  . increase confusion    Brief Narrative:  69 year old gentleman prior history of atrial fibrillation on anticoagulation, dementia and depression brought to ED for increasing agitation.  As per the family he has been progressively having worsening dementia.  Multiple medications have been attempted in the outpatient setting without any improvement.  His initial CT of the head on admission was negative for acute findings without any evidence of acute infarction.  Patient is currently on Depakote ER to 250 mg during the day and 500 at bedtime along with Seroquel 50 mg daily. No Improvement with Seroquel, which was discontinued and changed to Zyprexa 5 mg at bedtime. He is not stable for discharge as he either agitated and is requiring 4 point restraints or sedated from the mediations.  He will require 24 hours supervision and assistance. Discussed the plan with family at bedside.  Wife is in the process of initiating medicaid for him and is looking for long term residence.  PT Evaluation ordered but they couldn't work with him as he has been in restraints.  Over the weekend he has had periods where he was off restraints and walked with the nursing staff in the hallways for a few hours.  .    Assessment & Plan:   Active Problems:   Major depression, recurrent, chronic (HCC)   Essential hypertension   GAD (generalized anxiety disorder)   Dementia with behavioral disturbance (HCC)   Atrial fibrillation with rapid ventricular response (HCC)   Hypokalemia   AMS (altered mental status)   Acute delirium   Hypothyroidism   Vitamin B 12 deficiency  1. Acute delirium superimposed on progressive chronic dementia Precipitated by recent death in the family.  Head CT done negative for any acute abnormalities.  MRI unable to be done as patient not  cooperative.  Patient with no focal neurological symptoms.  Unlikely MRI would change treatment plan at this time.  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  Alcohol level negative on admission.  Ammonia level normal.  TSH normal.  Thiamine normal.  Cortisol levels normal.  RPR nonreactive.  Patient with low vitamin B12 which is being supplemented.  Patient was seen in consultation by psychiatry who initially recommended medical management.  Patient started on Seroquel 25 mg nightly which has been increased to 50 mg without any significant improvement.  Seroquel was discontinued and patient started on Zyprexa 5 mg at bedtime.  Patient was  also on Depakote 250 mg in the morning and 500 mg at bedtime. Patient with agitation and aggressiveness still requiring four-point restraints. Due to ongoing agitation psychiatry reconsulted and patient seen by psychiatry who recommended adjustment of medications.  Depakote has been increased to 500 mg twice daily.  Psychiatry recommended Zyprexa 2.5 mg in the morning and 5 mg nightly.  Psychiatry has placed the order for valproic acid level for 08/07/2020 with target goal of 80-100.  Due to ongoing agitation Zyprexa increased to 5 mg twice daily.   Psychiatry also recommended Posey belt to help reduce use of restraints, decrease simulation and offer a more calm safe environment for the patient.  Posey belt ordered however due to increased agitation and aggressiveness, patient placed back in four-point restraints. Psychiatry recommending inpatient psychiatric admission/neuropsych inpatient admission once medically stable.  Patient currently medically stable.  Social work consulted.  Psychiatry following.  10/13/2019-patient awake and still on four-point restraint.  Verbalizing but not making sense.  Ate about 25% of his breakfast.   2.  Chronic atrial fibrillation Lopressor for rate control.  Eliquis for anticoagulation.  Outpatient follow-up with cardiology for further  discussions on ongoing anticoagulation due to history of progressive worsening dementia.  3.  Prolonged QTC Improved.  Potassium at 3.7.  Magnesium was 2 (08/07/2020).  4.  Type 2 diabetes mellitus well controlled Hemoglobin A1c 6.9 (06/30/2020).  CBG 106.  Sliding scale insulin.   5.  Hypothyroidism Synthroid.   6.  Hypertension Controlled on current regimen of Norvasc, Lopressor.    7.  Depression/anxiety Continue Ativan, Cymbalta.  Psychiatry following.  Needs inpatient psych admission.    8.  Vitamin B12 deficiency Continue vitamin B12 supplementation.   9.  Vitamin D deficiency Vitamin D supplementation.   10.  History of ulcerative colitis Continue Imuran.  Will need outpatient follow-up with GI.    DVT prophylaxis: Eliquis Code Status: DNR Family Communication: No family at bedside. Disposition:   Status is: Inpatient    Dispo:  Patient From:    Planned Disposition: Inpatient psychiatry/[facility]  Expected discharge date: To be determined, when bed available.  Medically stable for discharge: Yes        Consultants:   Psychiatry: Dr. Dwyane Dee 08/03/2020, Dr. Toy Care 07/22/2020, Dr.Kumar, 07/14/2020    Procedures:   CT head 07/13/2020  Chest x-ray 07/13/2020, 07/30/2020  Antimicrobials:  Anti-infectives (From admission, onward)   Start     Dose/Rate Route Frequency Ordered Stop   07/14/20 0445  valACYclovir (VALTREX) tablet 1,000 mg  Status:  Discontinued       Note to Pharmacy: dermatonal shingles   1,000 mg Oral 3 times daily 07/14/20 0357 07/17/20 1007       Subjective: Patient alert.  In four-point restraints.  No chest pain.  No shortness of breath.  Nurse tech at bedside.    Objective: Vitals:   08/10/20 0543 08/10/20 1013 08/10/20 1355 08/10/20 1908  BP: 98/61 (!) 106/91 139/81 (!) 121/97  Pulse: 98 61 79 86  Resp: 18 16 16 15   Temp: 98.1 F (36.7 C) 98.4 F (36.9 C) 98.3 F (36.8 C) 99 F (37.2 C)  TempSrc: Oral Oral Oral     SpO2: 92% 96% 100% 96%  Weight:      Height:        Intake/Output Summary (Last 24 hours) at 08/11/2020 1528 Last data filed at 08/11/2020 1514 Gross per 24 hour  Intake 573 ml  Output 400 ml  Net 173 ml   Filed Weights   07/20/20 0922  Weight: 115.2 kg    Examination:  General exam: In four-point restraints.  He is awake and verbalizing but not making sense.   Respiratory system: Lungs clear to auscultation bilaterally anterior lung fields.  No wheezes, no crackles, no rhonchi.  Normal respiratory effort.  Cardiovascular system: RRR no murmurs rubs or gallops.  No JVD.  No lower extremity edema.  Gastrointestinal system: Abdomen is soft, nontender, nondistended, positive bowel sounds.  No rebound.  No guarding.  Central nervous system: Moving extremities spontaneously.  No focal neurological deficits. Extremities: Symmetric 5 x 5 power. Skin: No rashes, lesions or ulcers Psychiatry: Judgement and insight appear poor. Mood & affect appropriate.     Data Reviewed: I have personally reviewed following labs and imaging studies  CBC: Recent Labs  Lab 08/08/20 0340  WBC 6.7  HGB 14.5  HCT 42.7  MCV  98.8  PLT 158    Basic Metabolic Panel: Recent Labs  Lab 08/07/20 0529 08/08/20 0340  NA 143 141  K 3.7 3.7  CL 104 103  CO2 27 28  GLUCOSE 107* 104*  BUN 14 14  CREATININE 1.04 1.05  CALCIUM 9.1 9.0  MG 2.0  --     GFR: Estimated Creatinine Clearance: 78.2 mL/min (by C-G formula based on SCr of 1.05 mg/dL).  Liver Function Tests: No results for input(s): AST, ALT, ALKPHOS, BILITOT, PROT, ALBUMIN in the last 168 hours.  CBG: Recent Labs  Lab 08/10/20 0743 08/10/20 1136 08/10/20 1646 08/11/20 0735 08/11/20 1141  GLUCAP 101* 104* 151* 107* 133*     No results found for this or any previous visit (from the past 240 hour(s)).       Radiology Studies: No results found.      Scheduled Meds: . amLODipine  5 mg Oral Daily  . apixaban  5 mg Oral  BID  . atorvastatin  20 mg Oral Daily  . azaTHIOprine  200 mg Oral Daily  . divalproex  500 mg Oral Daily  . divalproex  750 mg Oral QHS  . DULoxetine  60 mg Oral Daily  . feeding supplement  237 mL Oral BID BM  . insulin aspart  0-9 Units Subcutaneous TID WC  . levothyroxine  25 mcg Oral Q0600  . melatonin  3 mg Oral QHS  . metoprolol tartrate  50 mg Oral BID  . OLANZapine  5 mg Oral BID  . vitamin B-12  1,000 mcg Oral Daily  . Vitamin D (Ergocalciferol)  50,000 Units Oral Q7 days   Continuous Infusions:   LOS: 28 days    Time spent: 35 mins    Elie Confer, MD [facility] Pager: [phone]    08/11/2020, 3:28 PM

## 2020-08-11 NOTE — Progress Notes (Signed)
Patient having increased agitation and removing telemetry box and leads throughout the shift. Several attempts to have the patient reconnected and was not effective. Patient remains on standby. Will continue to monitor the patient.

## 2020-08-12 DIAGNOSIS — R410 Disorientation, unspecified: Secondary | ICD-10-CM | POA: Diagnosis not present

## 2020-08-12 DIAGNOSIS — R4182 Altered mental status, unspecified: Secondary | ICD-10-CM | POA: Diagnosis not present

## 2020-08-12 LAB — GLUCOSE, CAPILLARY
Glucose-Capillary: 111 mg/dL — ABNORMAL HIGH (ref 70–99)
Glucose-Capillary: 120 mg/dL — ABNORMAL HIGH (ref 70–99)
Glucose-Capillary: 103 mg/dL — ABNORMAL HIGH (ref 70–99)
Glucose-Capillary: 106 mg/dL — ABNORMAL HIGH (ref 70–99)

## 2020-08-12 NOTE — Progress Notes (Signed)
PROGRESS NOTE    [name] STROUT Sr.  UEA:[id number] DOB: 1950/11/27 DOA: 07/13/2020 PCP: [name], DO   Chief Complain:  Brief Narrative: 69 year old gentleman prior history of atrial fibrillation on anticoagulation, dementia and depression brought to ED for increasing agitation. As per the family he has been progressively having worsening dementia. Multiple medications have been attempted in the outpatient setting without any improvement. His initial CT of the head on admission was negative for acute findings without any evidence of acute infarction. Patient is currently on Depakote ER to 245m during the day and 500 at bedtime along with Seroquel 50 mg daily. No Improvement with Seroquel, which was discontinued and changed to Zyprexa 5 mg at bedtime. He is not stable for discharge as he either agitated and is requiring 4 point restraints or sedated from the mediations.Wife is in the process of initiating medicaid for him and is looking for long term residence.  PT Evaluation ordered but they couldn't work with him as he has been in restraints.  Social worker closely following for disposition.  [facility]   Assessment & Plan:   Active Problems:   Major depression, recurrent, chronic (HCC)   Essential hypertension   GAD (generalized anxiety disorder)   Dementia with behavioral disturbance (HCC)   Atrial fibrillation with rapid ventricular response (HCC)   Hypokalemia   AMS (altered mental status)   Acute delirium   Hypothyroidism   Vitamin B 12 deficiency   1. Acute delirium superimposed on progressive chronic dementia Initially precipitated by recent death in the family.  Head CT done ,negative for any acute abnormalities.  MRI unable to be done as patient not cooperative.  Patient with no focal neurological symptoms. Alcohol level negative on admission.  Ammonia level normal.  TSH normal.  Thiamine normal.  Cortisol levels normal.  RPR  nonreactive.  Patient with low vitamin B12 which is being supplemented.  Patient was seen in consultation by psychiatry who initially recommended medical management.  Patient started on Seroquel 25 mg nightly which has been increased to 50 mg without any significant improvement.  Seroquel was discontinued and patient started on Zyprexa 5 mg at bedtime.  Patient was  also on Depakote 250 mg in the morning and 500 mg at bedtime. Patient with agitation and aggressiveness still requiring four-point restraints. Due to ongoing agitation psychiatry reconsulted and patient seen by psychiatry who recommended adjustment of medications.  Depakote has been increased to 500 mg twice daily.  Psychiatry recommended Zyprexa 2.5 mg in the morning and 5 mg nightly.   Due to ongoing agitation Zyprexa increased to 5 mg twice daily.   Psychiatry also recommended Posey belt to help reduce use of restraints, decrease simulation and offer a more calm safe environment for the patient.   Psychiatry recommending inpatient psychiatric admission/neuropsych inpatient admission once medically stable.  Patient currently medically stable for discharge .  Social worker closely following for disposition.  [facility] available yet.  2.  Chronic atrial fibrillation On Lopressor for rate control.  Eliquis for anticoagulation.  Outpatient follow-up with cardiology for further discussions on ongoing anticoagulation due to history of progressive worsening dementia.  3.  Prolonged QTC Improved.  Monitor electrolytes  4.  Type 2 diabetes mellitus well controlled Hemoglobin A1c 6.9  as per 06/30/2020.   On sliding scale insulin.   5.  Hypothyroidism On Synthroid.   6.  Hypertension Monitor BP .On  Norvasc, Lopressor.    7.  Depression/anxiety Continue current medications.  Psychiatry was following.  Needs inpatient psych admission.    8.  Vitamin B12 deficiency Continue vitamin B12  supplementation.   9.  Vitamin D deficiency Continue Vitamin D supplementation.   10.  History of ulcerative colitis Continue Imuran.  Needs outpatient follow-up with GI.           DVT prophylaxis:Eliquis Code Status: DNR Family Communication: None at bedside Status is: Inpatient  Remains inpatient appropriate because:Unsafe d/c plan   Dispo:  Patient From:  Home  Planned Disposition:  Inpatient psych  Expected discharge date: 08/20/20  Medically stable for discharge:  Yes     Consultants: Psychiatry  Procedures: None  Antimicrobials:  Anti-infectives (From admission, onward)   Start     Dose/Rate Route Frequency Ordered Stop   07/14/20 0445  valACYclovir (VALTREX) tablet 1,000 mg  Status:  Discontinued       Note to Pharmacy: dermatonal shingles   1,000 mg Oral 3 times daily 07/14/20 0357 07/17/20 1007      Subjective: Patient seen and examined the bedside this afternoon.  He was in restraints.  Sitter at the bedside.  Confused but communicated well.  Not in any kind of distress.  Objective: Vitals:   08/11/20 1623 08/11/20 2100 08/12/20 0537 08/12/20 1406  BP: 98/68 (!) 133/95 114/78 117/62  Pulse: 72 97 84 86  Resp: 15 16 15 19   Temp: 98 F (36.7 C) 97.7 F (36.5 C) (!) 97.5 F (36.4 C) 98 F (36.7 C)  TempSrc: Oral Oral Oral   SpO2: 97% 97% 99% 95%  Weight:      Height:        Intake/Output Summary (Last 24 hours) at 08/12/2020 1530 Last data filed at 08/12/2020 1408 Gross per 24 hour  Intake 692 ml  Output 760 ml  Net -68 ml   Filed Weights   07/20/20 0922  Weight: 115.2 kg    Examination:  General exam: morbidly obese, overall comfortable HEENT:PERRL,Oral mucosa moist, Ear/Nose normal on gross exam Respiratory system: Bilateral equal air entry, normal vesicular breath sounds, no wheezes or crackles  Cardiovascular system: S1 & S2 heard, RRR. No JVD, murmurs, rubs, gallops or clicks. No pedal edema. Gastrointestinal system:  Abdomen is nondistended, soft and nontender. No organomegaly or masses felt. Normal bowel sounds heard. Central nervous system: Alert and awake but not oriented. Extremities: No edema, no clubbing ,no cyanosis Skin: No rashes, lesions or ulcers,no icterus ,no pallor Psychiatry: Judgement and insight appear impaired    Data Reviewed: I have personally reviewed following labs and imaging studies  CBC: Recent Labs  Lab 08/08/20 0340  WBC 6.7  HGB 14.5  HCT 42.7  MCV 98.8  PLT 540   Basic Metabolic Panel: Recent Labs  Lab 08/07/20 0529 08/08/20 0340  NA 143 141  K 3.7 3.7  CL 104 103  CO2 27 28  GLUCOSE 107* 104*  BUN 14 14  CREATININE 1.04 1.05  CALCIUM 9.1 9.0  MG 2.0  --    GFR: Estimated Creatinine Clearance: 78.2 mL/min (by C-G formula based on SCr of 1.05 mg/dL). Liver Function Tests: No results for input(s): AST, ALT, ALKPHOS, BILITOT, PROT, ALBUMIN in the last 168 hours. No results for input(s): LIPASE, AMYLASE in the last 168 hours. No results for input(s): AMMONIA in the last 168 hours. Coagulation Profile: No results for input(s): INR, PROTIME in the last 168 hours. Cardiac  Enzymes: No results for input(s): CKTOTAL, CKMB, CKMBINDEX, TROPONINI in the last 168 hours. BNP (last 3 results) No results for input(s): PROBNP in the last 8760 hours. HbA1C: No results for input(s): HGBA1C in the last 72 hours. CBG: Recent Labs  Lab 08/11/20 1141 08/11/20 1721 08/11/20 2102 08/12/20 0716 08/12/20 1106  GLUCAP 133* 113* 120* 111* 120*   Lipid Profile: No results for input(s): CHOL, HDL, LDLCALC, TRIG, CHOLHDL, LDLDIRECT in the last 72 hours. Thyroid Function Tests: No results for input(s): TSH, T4TOTAL, FREET4, T3FREE, THYROIDAB in the last 72 hours. Anemia Panel: No results for input(s): VITAMINB12, FOLATE, FERRITIN, TIBC, IRON, RETICCTPCT in the last 72 hours. Sepsis Labs: No results for input(s): PROCALCITON, LATICACIDVEN in the last 168 hours.  No  results found for this or any previous visit (from the past 240 hour(s)).       Radiology Studies: No results found.      Scheduled Meds: . amLODipine  5 mg Oral Daily  . apixaban  5 mg Oral BID  . atorvastatin  20 mg Oral Daily  . azaTHIOprine  200 mg Oral Daily  . divalproex  500 mg Oral Daily  . divalproex  750 mg Oral QHS  . DULoxetine  60 mg Oral Daily  . feeding supplement  237 mL Oral BID BM  . insulin aspart  0-9 Units Subcutaneous TID WC  . levothyroxine  25 mcg Oral Q0600  . melatonin  3 mg Oral QHS  . metoprolol tartrate  50 mg Oral BID  . OLANZapine  5 mg Oral BID  . vitamin B-12  1,000 mcg Oral Daily  . Vitamin D (Ergocalciferol)  50,000 Units Oral Q7 days   Continuous Infusions:   LOS: 29 days    Time spent:25 mins, More than 50% of that time was spent in counseling and/or coordination of care.      Shelly Coss, MD [facility] P12/11/2019, 3:30 PM

## 2020-08-12 NOTE — TOC Progression Note (Signed)
Transition of Care (TOC) - Progression Note    Patient Details  Name: [name] MOFFA Sr. MRN: [id number] Date of Birth: 30-Jul-1951  Transition of Care [facility]) CM/SW [place], [place] Phone Number: 08/12/2020, 7:55 AM  Clinical Narrative:  Received confirmation this AM that patient is on the wait list for [facility] geropsych unit.  Nursing, if there are events of aggression, please document so that I can update [facility] and try to get him expedited list. TOC will continue to follow during the course of hospitalization.     Expected Discharge Plan: Psychiatric Hospital Barriers to Discharge: Psych Bed not available  Expected Discharge Plan and Services Expected Discharge Plan: Psychiatric Hospital In-house Referral: Clinical Social Work Discharge Planning Services: CM Consult Post Acute Care Choice: [place] arrangements for the past 2 months: Single Family Home                                       Social Determinants of Health (SDOH) Interventions    Readmission Risk Interventions Readmission Risk Prevention Plan 02/18/2019  Transportation Screening Complete  PCP or Specialist Appt within 3-5 Days Complete  HRI or [place] Complete  Social Work Consult for [place] Planning/Counseling Complete  Palliative Care Screening Not Applicable  Medication Review Press photographer) Complete  Some recent data might be hidden

## 2020-08-13 DIAGNOSIS — R410 Disorientation, unspecified: Secondary | ICD-10-CM | POA: Diagnosis not present

## 2020-08-13 DIAGNOSIS — R4182 Altered mental status, unspecified: Secondary | ICD-10-CM | POA: Diagnosis not present

## 2020-08-13 LAB — GLUCOSE, CAPILLARY
Glucose-Capillary: 109 mg/dL — ABNORMAL HIGH (ref 70–99)
Glucose-Capillary: 111 mg/dL — ABNORMAL HIGH (ref 70–99)
Glucose-Capillary: 135 mg/dL — ABNORMAL HIGH (ref 70–99)
Glucose-Capillary: 105 mg/dL — ABNORMAL HIGH (ref 70–99)

## 2020-08-13 NOTE — Progress Notes (Signed)
PROGRESS NOTE    [name] Sr.  MCN:[id number] DOB: 1951-03-19 DOA: 07/13/2020 PCP: [name], DO   Chief Complain:  Brief Narrative: 69 year old gentleman prior history of atrial fibrillation on anticoagulation, dementia and depression brought to ED for increasing agitation. As per the family he has been progressively having worsening dementia. Multiple medications have been attempted in the outpatient setting without any improvement. His initial CT of the head on admission was negative for acute findings without any evidence of acute infarction. Patient is currently on Depakote ER to 26m during the day and 500 at bedtime along with Seroquel 50 mg daily. No Improvement with Seroquel, which was discontinued and changed to Zyprexa 5 mg at bedtime. He is not stable for discharge as he either agitated and is requiring 4 point restraints or sedated from the mediations.Wife is in the process of initiating medicaid for him and is looking for long term residence.  PT Evaluation ordered but they couldn't work with him as he has been in restraints.  Social worker closely following for disposition.  [facility]   Assessment & Plan:   Active Problems:   Major depression, recurrent, chronic (HCC)   Essential hypertension   GAD (generalized anxiety disorder)   Dementia with behavioral disturbance (HCC)   Atrial fibrillation with rapid ventricular response (HCC)   Hypokalemia   AMS (altered mental status)   Acute delirium   Hypothyroidism   Vitamin B 12 deficiency   1. Acute delirium superimposed on progressive chronic dementia Initially precipitated by recent death in the family.  Head CT done ,negative for any acute abnormalities.  MRI unable to be done as patient not cooperative.  Patient with no focal neurological symptoms. Alcohol level negative on admission.  Ammonia level normal.  TSH normal.  Thiamine normal.  Cortisol levels normal.  RPR  nonreactive.  Patient with low vitamin B12 which is being supplemented.  Patient was seen in consultation by psychiatry who initially recommended medical management.  Patient started on Seroquel 25 mg nightly which has been increased to 50 mg without any significant improvement.  Seroquel was discontinued and patient started on Zyprexa 5 mg at bedtime.  Patient was  also on Depakote 250 mg in the morning and 500 mg at bedtime. Patient with agitation and aggressiveness still requiring four-point restraints. Due to ongoing agitation psychiatry reconsulted and patient seen by psychiatry who recommended adjustment of medications.  Depakote has been increased to 500 mg twice daily.  Psychiatry recommended Zyprexa 2.5 mg in the morning and 5 mg nightly.   Due to ongoing agitation Zyprexa increased to 5 mg twice daily.   Psychiatry also recommended Posey belt to help reduce use of restraints, decrease simulation and offer a more calm safe environment for the patient.   Psychiatry recommending inpatient psychiatric admission/neuropsych inpatient admission once medically stable.  Patient currently medically stable for discharge .  Social worker closely following for disposition.  [facility]  2.  Chronic atrial fibrillation On Lopressor for rate control.  Eliquis for anticoagulation.  Outpatient follow-up with cardiology for further discussions on ongoing anticoagulation due to history of progressive worsening dementia.  3.  Prolonged QTC Improved.  Monitor electrolytes  4.  Type 2 diabetes mellitus well controlled Hemoglobin A1c 6.9  as per 06/30/2020.   On sliding scale insulin.   5.  Hypothyroidism On Synthroid.   6.  Hypertension Monitor BP .On  Norvasc, Lopressor.    7.  Depression/anxiety Continue current medications.  Psychiatry was following.  Needs inpatient psych admission.    8.  Vitamin B12 deficiency Continue vitamin B12  supplementation.   9.  Vitamin D deficiency Continue Vitamin D supplementation.   10.  History of ulcerative colitis Continue Imuran.  Needs outpatient follow-up with GI.           DVT prophylaxis:Eliquis Code Status: DNR Family Communication: None at bedside Status is: Inpatient  Remains inpatient appropriate because:Unsafe d/c plan   Dispo:  Patient From:  Home  Planned Disposition:  Inpatient psych  Expected discharge date: 08/20/20  Medically stable for discharge:  Yes     Consultants: Psychiatry  Procedures: None  Antimicrobials:  Anti-infectives (From admission, onward)   Start     Dose/Rate Route Frequency Ordered Stop   07/14/20 0445  valACYclovir (VALTREX) tablet 1,000 mg  Status:  Discontinued       Note to Pharmacy: dermatonal shingles   1,000 mg Oral 3 times daily 07/14/20 0357 07/17/20 1007      Subjective: Patient seen at the bedside.  Sitter in the room.  He was on soft restraints, eyes closed  Objective: Vitals:   08/12/20 0537 08/12/20 1406 08/12/20 2115 08/13/20 0611  BP: 114/78 117/62 127/75 125/66  Pulse: 84 86 89 88  Resp: 15 19 20 20   Temp: (!) 97.5 F (36.4 C) 98 F (36.7 C) 98.3 F (36.8 C) 98 F (36.7 C)  TempSrc: Oral Oral Oral Oral  SpO2: 99% 95% 97% 98%  Weight:      Height:        Intake/Output Summary (Last 24 hours) at 08/13/2020 8616 Last data filed at 08/13/2020 0600 Gross per 24 hour  Intake 712 ml  Output 400 ml  Net 312 ml   Filed Weights   07/20/20 0922  Weight: 115.2 kg    Examination:  General exam: morbidly obese, overall comfortable, in restraints, not agitated during my evaluation   Data Reviewed: I have personally reviewed following labs and imaging studies  CBC: Recent Labs  Lab 08/08/20 0340  WBC 6.7  HGB 14.5  HCT 42.7  MCV 98.8  PLT 837   Basic Metabolic Panel: Recent Labs  Lab 08/07/20 0529 08/08/20 0340  NA 143 141  K 3.7 3.7  CL 104 103  CO2 27 28  GLUCOSE 107* 104*   BUN 14 14  CREATININE 1.04 1.05  CALCIUM 9.1 9.0  MG 2.0  --    GFR: Estimated Creatinine Clearance: 78.2 mL/min (by C-G formula based on SCr of 1.05 mg/dL). Liver Function Tests: No results for input(s): AST, ALT, ALKPHOS, BILITOT, PROT, ALBUMIN in the last 168 hours. No results for input(s): LIPASE, AMYLASE in the last 168 hours. No results for input(s): AMMONIA in the last 168 hours. Coagulation Profile: No results for input(s): INR, PROTIME in the last 168 hours. Cardiac Enzymes: No results for input(s): CKTOTAL, CKMB, CKMBINDEX, TROPONINI in the last 168 hours. BNP (last 3 results) No results for input(s): PROBNP in the last 8760 hours. HbA1C: No results for input(s): HGBA1C in the last 72 hours. CBG: Recent Labs  Lab 08/12/20 0716 08/12/20 1106 08/12/20 1705 08/12/20 2120 08/13/20 0738  GLUCAP 111* 120* 103* 106* 109*   Lipid Profile: No results for input(s): CHOL, HDL, LDLCALC, TRIG, CHOLHDL, LDLDIRECT in the last 72 hours. Thyroid Function Tests: No results for input(s): TSH, T4TOTAL, FREET4, T3FREE, THYROIDAB in the last  72 hours. Anemia Panel: No results for input(s): VITAMINB12, FOLATE, FERRITIN, TIBC, IRON, RETICCTPCT in the last 72 hours. Sepsis Labs: No results for input(s): PROCALCITON, LATICACIDVEN in the last 168 hours.  No results found for this or any previous visit (from the past 240 hour(s)).       Radiology Studies: No results found.      Scheduled Meds: . amLODipine  5 mg Oral Daily  . apixaban  5 mg Oral BID  . atorvastatin  20 mg Oral Daily  . azaTHIOprine  200 mg Oral Daily  . divalproex  500 mg Oral Daily  . divalproex  750 mg Oral QHS  . DULoxetine  60 mg Oral Daily  . feeding supplement  237 mL Oral BID BM  . insulin aspart  0-9 Units Subcutaneous TID WC  . levothyroxine  25 mcg Oral Q0600  . melatonin  3 mg Oral QHS  . metoprolol tartrate  50 mg Oral BID  . OLANZapine  5 mg Oral BID  . vitamin B-12  1,000 mcg Oral Daily    . Vitamin D (Ergocalciferol)  50,000 Units Oral Q7 days   Continuous Infusions:   LOS: 30 days    Time spent:15 mins, More than 50% of that time was spent in counseling and/or coordination of care.      Shelly Coss, MD [facility] P12/12/2019, 8:32 AM

## 2020-08-14 DIAGNOSIS — R4182 Altered mental status, unspecified: Secondary | ICD-10-CM | POA: Diagnosis not present

## 2020-08-14 DIAGNOSIS — R410 Disorientation, unspecified: Secondary | ICD-10-CM | POA: Diagnosis not present

## 2020-08-14 LAB — GLUCOSE, CAPILLARY
Glucose-Capillary: 110 mg/dL — ABNORMAL HIGH (ref 70–99)
Glucose-Capillary: 151 mg/dL — ABNORMAL HIGH (ref 70–99)
Glucose-Capillary: 132 mg/dL — ABNORMAL HIGH (ref 70–99)
Glucose-Capillary: 103 mg/dL — ABNORMAL HIGH (ref 70–99)

## 2020-08-14 MED ORDER — SODIUM CHLORIDE 0.9 % IV BOLUS
500.0000 mL | Freq: Once | INTRAVENOUS | Status: AC
  Administered 2020-08-15: 500 mL via INTRAVENOUS

## 2020-08-14 MED ORDER — TAMSULOSIN HCL 0.4 MG PO CAPS
0.4000 mg | ORAL_CAPSULE | Freq: Every day | ORAL | Status: DC
  Administered 2020-08-14 – 2020-09-09 (×26): 0.4 mg via ORAL
  Filled 2020-08-14 (×27): qty 1

## 2020-08-14 NOTE — Progress Notes (Signed)
PROGRESS NOTE    [name] CELONA Sr.  CNO:[id number] DOB: [birth year] DOA: 07/13/2020 PCP: [name], DO   Chief Complain:  Brief Narrative: 69 year old gentleman prior history of atrial fibrillation on anticoagulation, dementia and depression brought to ED for increasing agitation. As per the family he has been progressively having worsening dementia. Multiple medications have been attempted in the outpatient setting without any improvement. His initial CT of the head on admission was negative for acute findings without any evidence of acute infarction. Patient is currently on Depakote ER to 276m during the day and 500 at bedtime along with Seroquel 50 mg daily. No Improvement with Seroquel, which was discontinued and changed to Zyprexa 5 mg at bedtime. He is not stable for discharge as he either agitated and is requiring 4 point restraints or sedated from the mediations.Wife is in the process of initiating medicaid for him and is looking for long term residence.  PT Evaluation ordered but they couldn't work with him as he has been in restraints.  Social worker closely following for disposition.  [facility]   Assessment & Plan:   Active Problems:   Major depression, recurrent, chronic (HCC)   Essential hypertension   GAD (generalized anxiety disorder)   Dementia with behavioral disturbance (HCC)   Atrial fibrillation with rapid ventricular response (HCC)   Hypokalemia   AMS (altered mental status)   Acute delirium   Hypothyroidism   Vitamin B 12 deficiency   1. Acute delirium superimposed on progressive chronic dementia Initially precipitated by recent death in the family.  Head CT done ,negative for any acute abnormalities.  MRI unable to be done as patient not cooperative.  Patient with no focal neurological symptoms. Alcohol level negative on admission.  Ammonia level normal.  TSH normal.  Thiamine normal.  Cortisol levels normal.  RPR  nonreactive.  Patient with low vitamin B12 which is being supplemented.  Patient was seen in consultation by psychiatry who initially recommended medical management.  Patient started on Seroquel 25 mg nightly which has been increased to 50 mg without any significant improvement.  Seroquel was discontinued and patient started on Zyprexa 5 mg at bedtime.  Patient was  also on Depakote 250 mg in the morning and 500 mg at bedtime. Patient with agitation and aggressiveness still requiring four-point restraints. Due to ongoing agitation psychiatry reconsulted and patient seen by psychiatry who recommended adjustment of medications.  Depakote has been increased to 500 mg twice daily.  Psychiatry recommended Zyprexa 2.5 mg in the morning and 5 mg nightly.   Due to ongoing agitation Zyprexa increased to 5 mg twice daily.   Psychiatry also recommended Posey belt to help reduce use of restraints, decrease simulation and offer a more calm safe environment for the patient.   Psychiatry recommending inpatient psychiatric admission/neuropsych inpatient admission once medically stable.  Patient currently medically stable for discharge .  Social worker closely following for disposition.  [facility]No bed is available yet.  2.  Chronic atrial fibrillation On Lopressor for rate control.  Eliquis for anticoagulation.  Outpatient follow-up with cardiology for further discussions on ongoing anticoagulation due to history of progressive worsening dementia.  3.  Prolonged QTC Improved.  Monitor electrolytes  4.  Type 2 diabetes mellitus well controlled Hemoglobin A1c 6.9  as per 06/30/2020.   On sliding scale insulin.   5.  Hypothyroidism On Synthroid.   6.  Hypertension Monitor BP .On  Norvasc, Lopressor.    7.  Depression/anxiety Continue current medications.  Psychiatry was following.  Needs inpatient psych admission.    8.  Vitamin B12 deficiency Continue vitamin B12  supplementation.   9.  Vitamin D deficiency Continue Vitamin D supplementation.   10.  History of ulcerative colitis Continue Imuran.  Needs outpatient follow-up with GI.   11.  Urine retention Needing in and out.  Continue tamsulosin         DVT prophylaxis:Eliquis Code Status: DNR Family Communication: None at bedside Status is: Inpatient  Remains inpatient appropriate because:Unsafe d/c plan   Dispo:  Patient From:  Home  Planned Disposition:  Inpatient psych  Expected discharge date: 08/20/20  Medically stable for discharge:  Yes     Consultants: Psychiatry  Procedures: None  Antimicrobials:  Anti-infectives (From admission, onward)   Start     Dose/Rate Route Frequency Ordered Stop   07/14/20 0445  valACYclovir (VALTREX) tablet 1,000 mg  Status:  Discontinued       Note to Pharmacy: dermatonal shingles   1,000 mg Oral 3 times daily 07/14/20 0357 07/17/20 1007      Subjective: Patient seen and examined at the bedside this morning.  Hemodynamically stable, agitated and trying to get out of the bed.  Sitter at the bedside.  On soft restraints  Objective: Vitals:   08/13/20 1344 08/13/20 1826 08/13/20 2105 08/14/20 0514  BP: (!) 106/54 127/74 127/88 101/77  Pulse: 61  90 83  Resp: 16 16 16 20   Temp: 98.4 F (36.9 C) 98 F (36.7 C) 98 F (36.7 C) 97.6 F (36.4 C)  TempSrc: Axillary Axillary Oral Oral  SpO2: 93% 90% 94% 96%  Weight:      Height:        Intake/Output Summary (Last 24 hours) at 08/14/2020 1201 Last data filed at 08/13/2020 1559 Gross per 24 hour  Intake --  Output 700 ml  Net -700 ml   Filed Weights   07/20/20 0922  Weight: 115.2 kg    Examination:  General exam: Obese, agitated, on soft restraints   Data Reviewed: I have personally reviewed following labs and imaging studies  CBC: Recent Labs  Lab 08/08/20 0340  WBC 6.7  HGB 14.5  HCT 42.7  MCV 98.8  PLT 275   Basic Metabolic Panel: Recent Labs  Lab  08/08/20 0340  NA 141  K 3.7  CL 103  CO2 28  GLUCOSE 104*  BUN 14  CREATININE 1.05  CALCIUM 9.0   GFR: Estimated Creatinine Clearance: 78.2 mL/min (by C-G formula based on SCr of 1.05 mg/dL). Liver Function Tests: No results for input(s): AST, ALT, ALKPHOS, BILITOT, PROT, ALBUMIN in the last 168 hours. No results for input(s): LIPASE, AMYLASE in the last 168 hours. No results for input(s): AMMONIA in the last 168 hours. Coagulation Profile: No results for input(s): INR, PROTIME in the last 168 hours. Cardiac Enzymes: No results for input(s): CKTOTAL, CKMB, CKMBINDEX, TROPONINI in the last 168 hours. BNP (last 3 results) No results for input(s): PROBNP in the last 8760 hours. HbA1C: No results for input(s): HGBA1C in the last 72 hours. CBG: Recent Labs  Lab 08/13/20 1149 08/13/20 1652 08/13/20 2108 08/14/20 0743 08/14/20 1143  GLUCAP 111* 135* 105* 110* 151*   Lipid Profile: No results for input(s): CHOL, HDL, LDLCALC, TRIG, CHOLHDL, LDLDIRECT in the last 72 hours. Thyroid Function Tests: No results for input(s): TSH, T4TOTAL, FREET4, T3FREE, THYROIDAB in the last  72 hours. Anemia Panel: No results for input(s): VITAMINB12, FOLATE, FERRITIN, TIBC, IRON, RETICCTPCT in the last 72 hours. Sepsis Labs: No results for input(s): PROCALCITON, LATICACIDVEN in the last 168 hours.  No results found for this or any previous visit (from the past 240 hour(s)).       Radiology Studies: No results found.      Scheduled Meds: . amLODipine  5 mg Oral Daily  . apixaban  5 mg Oral BID  . atorvastatin  20 mg Oral Daily  . azaTHIOprine  200 mg Oral Daily  . divalproex  500 mg Oral Daily  . divalproex  750 mg Oral QHS  . DULoxetine  60 mg Oral Daily  . feeding supplement  237 mL Oral BID BM  . insulin aspart  0-9 Units Subcutaneous TID WC  . levothyroxine  25 mcg Oral Q0600  . melatonin  3 mg Oral QHS  . metoprolol tartrate  50 mg Oral BID  . OLANZapine  5 mg Oral BID   . vitamin B-12  1,000 mcg Oral Daily  . Vitamin D (Ergocalciferol)  50,000 Units Oral Q7 days   Continuous Infusions:   LOS: 31 days    Time spent:15 mins, More than 50% of that time was spent in counseling and/or coordination of care.      [name] Coss, MD [facility] P12/01/2020, 12:01 PM

## 2020-08-15 DIAGNOSIS — R4182 Altered mental status, unspecified: Secondary | ICD-10-CM | POA: Diagnosis not present

## 2020-08-15 DIAGNOSIS — R410 Disorientation, unspecified: Secondary | ICD-10-CM | POA: Diagnosis not present

## 2020-08-15 LAB — GLUCOSE, CAPILLARY
Glucose-Capillary: 105 mg/dL — ABNORMAL HIGH (ref 70–99)
Glucose-Capillary: 115 mg/dL — ABNORMAL HIGH (ref 70–99)
Glucose-Capillary: 133 mg/dL — ABNORMAL HIGH (ref 70–99)

## 2020-08-15 NOTE — Plan of Care (Signed)
Patient is at risk for skin integrity.  Nurse will continue to assess restraints every two hours and assess for cap. Refill and ROM.  Also patient will be turned and skin will be assessed.

## 2020-08-15 NOTE — Progress Notes (Signed)
PROGRESS NOTE    [name] MEDEROS Sr.  IOM:[id number] DOB: 29-Oct-1950 DOA: 07/13/2020 PCP: [name], DO   Chief Complain:  Brief Narrative:  69 year old gentleman prior history of atrial fibrillation on anticoagulation, dementia and depression brought to ED for increasing agitation. As per the family he has been progressively having worsening dementia. Multiple medications have been attempted in the outpatient setting without any improvement. His initial CT of the head on admission was negative for acute findings without any evidence of acute infarction. Patient is currently on Depakote ER to 212m during the day and 500 at bedtime along with Seroquel 50 mg daily. No Improvement with Seroquel, which was discontinued and changed to Zyprexa 5 mg at bedtime. He is not stable for discharge as he either agitated and is requiring 4 point restraints or sedated from the mediations.Wife is in the process of initiating medicaid for him and is looking for long term residence.  PT Evaluation ordered but they couldn't work with him as he has been in restraints.  Social worker closely following for disposition.  [facility]   Assessment & Plan:   Active Problems:   Major depression, recurrent, chronic (HCC)   Essential hypertension   GAD (generalized anxiety disorder)   Dementia with behavioral disturbance (HCC)   Atrial fibrillation with rapid ventricular response (HCC)   Hypokalemia   AMS (altered mental status)   Acute delirium   Hypothyroidism   Vitamin B 12 deficiency   1. Acute delirium superimposed on progressive chronic dementia Initially precipitated by recent death in the family.  Head CT done ,negative for any acute abnormalities.  MRI unable to be done as patient not cooperative.  Patient with no focal neurological symptoms. Alcohol level negative on admission.  Ammonia level normal.  TSH normal.  Thiamine normal.  Cortisol levels normal.   RPR nonreactive.  Patient with low vitamin B12 which is being supplemented.  Patient was seen in consultation by psychiatry who initially recommended medical management.  Patient started on Seroquel 25 mg nightly which has been increased to 50 mg without any significant improvement.  Seroquel was discontinued and patient started on Zyprexa 5 mg at bedtime.  Patient was  also on Depakote 250 mg in the morning and 500 mg at bedtime. Patient with agitation and aggressiveness still requiring four-point restraints. Due to ongoing agitation psychiatry reconsulted and patient seen by psychiatry who recommended adjustment of medications.  Depakote has been increased to 500 mg twice daily.  Psychiatry recommended Zyprexa 2.5 mg in the morning and 5 mg nightly.   Due to ongoing agitation Zyprexa increased to 5 mg twice daily.   Psychiatry also recommended Posey belt to help reduce use of restraints, decrease simulation and offer a more calm safe environment for the patient.   Psychiatry recommending inpatient psychiatric admission/neuropsych inpatient admission once medically stable.  Patient currently medically stable for discharge .  Social worker closely following for disposition.  [facility] is available yet.  2.  Chronic atrial fibrillation On Lopressor for rate control.  Eliquis for anticoagulation.  Outpatient follow-up with cardiology for further discussions on ongoing anticoagulation due to history of progressive worsening dementia.  3.  Prolonged QTC Improved.  Monitor electrolytes  4.  Type 2 diabetes mellitus well controlled Hemoglobin A1c 6.9  as per 06/30/2020.   On sliding scale insulin.   5.  Hypothyroidism On Synthroid.   6.  Hypertension Monitor BP .On  Norvasc, Lopressor.    7.  Depression/anxiety Continue current medications.  Psychiatry was following.  Needs inpatient psych admission.    8.  Vitamin B12 deficiency Continue vitamin  B12 supplementation.   9.  Vitamin D deficiency Continue Vitamin D supplementation.   10.  History of ulcerative colitis Continue Imuran.  Needs outpatient follow-up with GI.   11.  Urine retention Needing in and out.  Continue tamsulosin         DVT prophylaxis:Eliquis Code Status: DNR Family Communication: None at bedside Status is: Inpatient  Remains inpatient appropriate because:Unsafe d/c plan   Dispo:  Patient From:  Home  Planned Disposition:  Inpatient psych  Expected discharge date: 08/20/20  Medically stable for discharge:  Yes     Consultants: Psychiatry  Procedures: None  Antimicrobials:  Anti-infectives (From admission, onward)   Start     Dose/Rate Route Frequency Ordered Stop   07/14/20 0445  valACYclovir (VALTREX) tablet 1,000 mg  Status:  Discontinued       Note to Pharmacy: dermatonal shingles   1,000 mg Oral 3 times daily 07/14/20 0357 07/17/20 1007      Subjective: Patient seen and examined at the bedside this morning.  On soft restraints, eyes closed.  Sitter at the bedside  Objective: Vitals:   08/14/20 2311 08/14/20 2314 08/15/20 0218 08/15/20 0520  BP: (!) 82/61 (!) 84/57 (!) 105/59 100/60  Pulse: 99 87 86 69  Resp:    17  Temp:    97.8 F (36.6 C)  TempSrc:      SpO2: 98%   95%  Weight:      Height:        Intake/Output Summary (Last 24 hours) at 08/15/2020 0807 Last data filed at 08/15/2020 0400 Gross per 24 hour  Intake 589.54 ml  Output 201 ml  Net 388.54 ml   Filed Weights   07/20/20 0922  Weight: 115.2 kg    Examination:  General exam: Obese, on soft restraints, not agitated today   Data Reviewed: I have personally reviewed following labs and imaging studies  CBC: No results for input(s): WBC, NEUTROABS, HGB, HCT, MCV, PLT in the last 168 hours. Basic Metabolic Panel: No results for input(s): NA, K, CL, CO2, GLUCOSE, BUN, CREATININE, CALCIUM, MG, PHOS in the last 168 hours. GFR: Estimated Creatinine  Clearance: 78.2 mL/min (by C-G formula based on SCr of 1.05 mg/dL). Liver Function Tests: No results for input(s): AST, ALT, ALKPHOS, BILITOT, PROT, ALBUMIN in the last 168 hours. No results for input(s): LIPASE, AMYLASE in the last 168 hours. No results for input(s): AMMONIA in the last 168 hours. Coagulation Profile: No results for input(s): INR, PROTIME in the last 168 hours. Cardiac Enzymes: No results for input(s): CKTOTAL, CKMB, CKMBINDEX, TROPONINI in the last 168 hours. BNP (last 3 results) No results for input(s): PROBNP in the last 8760 hours. HbA1C: No results for input(s): HGBA1C in the last 72 hours. CBG: Recent Labs  Lab 08/14/20 0743 08/14/20 1143 08/14/20 1649 08/14/20 2113 08/15/20 0734  GLUCAP 110* 151* 132* 103* 105*   Lipid Profile: No results for input(s): CHOL, HDL, LDLCALC, TRIG, CHOLHDL, LDLDIRECT in the last 72 hours. Thyroid Function Tests: No results for input(s): TSH, T4TOTAL, FREET4, T3FREE, THYROIDAB in the last 72 hours. Anemia Panel: No results for input(s): VITAMINB12, FOLATE, FERRITIN, TIBC, IRON, RETICCTPCT in the last 72 hours. Sepsis Labs: No results for input(s): PROCALCITON, LATICACIDVEN in the last 168 hours.  No results  found for this or any previous visit (from the past 240 hour(s)).       Radiology Studies: No results found.      Scheduled Meds: . amLODipine  5 mg Oral Daily  . apixaban  5 mg Oral BID  . atorvastatin  20 mg Oral Daily  . azaTHIOprine  200 mg Oral Daily  . divalproex  500 mg Oral Daily  . divalproex  750 mg Oral QHS  . DULoxetine  60 mg Oral Daily  . feeding supplement  237 mL Oral BID BM  . insulin aspart  0-9 Units Subcutaneous TID WC  . levothyroxine  25 mcg Oral Q0600  . melatonin  3 mg Oral QHS  . metoprolol tartrate  50 mg Oral BID  . OLANZapine  5 mg Oral BID  . tamsulosin  0.4 mg Oral Daily  . vitamin B-12  1,000 mcg Oral Daily  . Vitamin D (Ergocalciferol)  50,000 Units Oral Q7 days    Continuous Infusions:   LOS: 32 days    Time spent:15 mins, More than 50% of that time was spent in counseling and/or coordination of care.      Shelly Coss, MD [facility] P12/02/2020, 8:07 AM

## 2020-08-16 DIAGNOSIS — R4182 Altered mental status, unspecified: Secondary | ICD-10-CM | POA: Diagnosis not present

## 2020-08-16 DIAGNOSIS — R410 Disorientation, unspecified: Secondary | ICD-10-CM | POA: Diagnosis not present

## 2020-08-16 LAB — GLUCOSE, CAPILLARY
Glucose-Capillary: 101 mg/dL — ABNORMAL HIGH (ref 70–99)
Glucose-Capillary: 141 mg/dL — ABNORMAL HIGH (ref 70–99)
Glucose-Capillary: 113 mg/dL — ABNORMAL HIGH (ref 70–99)
Glucose-Capillary: 91 mg/dL (ref 70–99)

## 2020-08-16 NOTE — Progress Notes (Signed)
Pt had no urine output on my shift. Pt was bladder scanned with 326cc shown in bladder. MD notified and order written to I&O cath. Pt voided on his own (incontinent episode)  before cath done.

## 2020-08-16 NOTE — Progress Notes (Signed)
PROGRESS NOTE    [name] SCHREINER Sr.  PPJ:[id number] DOB: 1950-09-30 DOA: 07/13/2020 PCP: [name] Hillock, DO   Chief Complain:  Brief Narrative:  69 year old gentleman prior history of atrial fibrillation on anticoagulation, dementia and depression brought to ED for increasing agitation. As per the family ,he has been progressively having worsening dementia since last 2 years. Multiple medications have been attempted in the outpatient setting without any improvement. His initial CT of the head on admission was negative for acute findings.Hospital course remarkable for persistent agitation requiring soft restraints. Wife is in the process of initiating medicaid for him and is looking for long term residence.   Social worker closely following for disposition.  Patient is on wait list at geropsych unit.   Assessment & Plan:   Active Problems:   Major depression, recurrent, chronic (HCC)   Essential hypertension   GAD (generalized anxiety disorder)   Dementia with behavioral disturbance (HCC)   Atrial fibrillation with rapid ventricular response (HCC)   Hypokalemia   AMS (altered mental status)   Acute delirium   Hypothyroidism   Vitamin B 12 deficiency   1. Acute delirium superimposed on progressive chronic dementia Initially precipitated by recent death in the family about 2 years ago.  Head CT done ,negative for any acute abnormalities.  MRI unable to be done as patient not cooperative.  Patient with no focal neurological symptoms. Alcohol level negative on admission.  Ammonia,TSH ,Thiamine, Cortisol levels normal.  RPR nonreactive.  Patient with low vitamin B12 which is being supplemented.  Patient was seen in consultation by psychiatry who initially recommended medical management.  Patient started on Seroquel 25 mg nightly which has been increased to 50 mg without any significant improvement.  Seroquel was discontinued and patient started on Zyprexa 5 mg at bedtime.  Patient was   also started on depakote. Patient with agitation and aggressiveness still requiring four-point restraints. Due to ongoing agitation, psychiatry was reconsulted and patient seen by psychiatry who recommended adjustment of medications. Psychiatry recommending inpatient psychiatric admission/neuropsych inpatient admission. Patient currently medically stable for discharge .  Social worker closely following for disposition.  Patient is on [facility].No bed is available yet.  2.  Chronic atrial fibrillation On Lopressor for rate control.  Eliquis for anticoagulation.  Outpatient follow-up with cardiology for further discussions on ongoing anticoagulation due to history of progressive worsening dementia.  3.  Prolonged QTC Improved.  Monitor electrolytes  4.  Type 2 diabetes mellitus well controlled Hemoglobin A1c 6.9  as per 06/30/2020.   On sliding scale insulin.   5.  Hypothyroidism On Synthroid.   6.  Hypertension Monitor BP .On  Norvasc, Lopressor.    7.  Depression/anxiety Continue current medications.  Psychiatry was following.  Needs inpatient psych admission.    8.  Vitamin B12 deficiency Continue vitamin B12 supplementation.   9.  Vitamin D deficiency Continue Vitamin D supplementation.   10.  History of ulcerative colitis Continue Imuran.  Needs outpatient follow-up with GI.   11.  Urine retention Needing in and out intermittently .  Continue tamsulosin         DVT prophylaxis:Eliquis Code Status: DNR Family Communication: Wife  at bedside Status is: Inpatient  Remains inpatient appropriate because:Unsafe d/c plan   Dispo:  Patient From:  Home  Planned Disposition:  Inpatient psych  Expected discharge date: 08/20/20  Medically stable for discharge:  Yes     Consultants: Psychiatry  Procedures: None  Antimicrobials:  Anti-infectives (From admission, onward)   Start     Dose/Rate Route Frequency Ordered Stop   07/14/20  0445  valACYclovir (VALTREX) tablet 1,000 mg  Status:  Discontinued       Note to Pharmacy: dermatonal shingles   1,000 mg Oral 3 times daily 07/14/20 0357 07/17/20 1007      Subjective:  Patient seen and examined the bedside this morning. Hemodynamically stable, on soft restraints. Wife bedside at the bedside today. Sitter at bedside. No change from yesterday.  Objective: Vitals:   08/15/20 0520 08/15/20 1320 08/15/20 1946 08/16/20 0318  BP: 100/60 114/73 116/84 114/80  Pulse: 69 77 86 77  Resp: 17 19 16 18   Temp: 97.8 F (36.6 C) 98.7 F (37.1 C) 97.7 F (36.5 C) 98.2 F (36.8 C)  TempSrc:  Oral    SpO2: 95% 99% 99% 99%  Weight:      Height:        Intake/Output Summary (Last 24 hours) at 08/16/2020 5956 Last data filed at 08/15/2020 1604 Gross per 24 hour  Intake 472 ml  Output --  Net 472 ml   Filed Weights   07/20/20 3875  Weight: 115.2 kg    Examination:  General exam: Lying on bed, on restraints, eyes closed, morbidly obese Respiratory system: Bilateral equal air entry, normal vesicular breath sounds, no wheezes or crackles  Cardiovascular system: Irregularly irregular rhythm. No JVD, murmurs, rubs, gallops or clicks. Gastrointestinal system: Abdomen is nondistended, soft and nontender.  Central nervous system: No alert or awake Extremities: No edema, no clubbing ,no cyanosis Skin: No rashes, lesions or ulcers,no icterus ,no pallor  Data Reviewed: I have personally reviewed following labs and imaging studies  CBC: No results for input(s): WBC, NEUTROABS, HGB, HCT, MCV, PLT in the last 168 hours. Basic Metabolic Panel: No results for input(s): NA, K, CL, CO2, GLUCOSE, BUN, CREATININE, CALCIUM, MG, PHOS in the last 168 hours. GFR: Estimated Creatinine Clearance: 78.2 mL/min (by C-G formula based on SCr of 1.05 mg/dL). Liver Function Tests: No results for input(s): AST, ALT, ALKPHOS, BILITOT, PROT, ALBUMIN in the last 168 hours. No results for input(s):  LIPASE, AMYLASE in the last 168 hours. No results for input(s): AMMONIA in the last 168 hours. Coagulation Profile: No results for input(s): INR, PROTIME in the last 168 hours. Cardiac Enzymes: No results for input(s): CKTOTAL, CKMB, CKMBINDEX, TROPONINI in the last 168 hours. BNP (last 3 results) No results for input(s): PROBNP in the last 8760 hours. HbA1C: No results for input(s): HGBA1C in the last 72 hours. CBG: Recent Labs  Lab 08/14/20 2113 08/15/20 0734 08/15/20 1134 08/15/20 1651 08/16/20 0749  GLUCAP 103* 105* 115* 133* 101*   Lipid Profile: No results for input(s): CHOL, HDL, LDLCALC, TRIG, CHOLHDL, LDLDIRECT in the last 72 hours. Thyroid Function Tests: No results for input(s): TSH, T4TOTAL, FREET4, T3FREE, THYROIDAB in the last 72 hours. Anemia Panel: No results for input(s): VITAMINB12, FOLATE, FERRITIN, TIBC, IRON, RETICCTPCT in the last 72 hours. Sepsis Labs: No results for input(s): PROCALCITON, LATICACIDVEN in the last 168 hours.  No results found for this or any previous visit (from the past 240 hour(s)).       Radiology Studies: No results found.      Scheduled Meds: . amLODipine  5 mg Oral Daily  . apixaban  5 mg Oral BID  . atorvastatin  20 mg Oral Daily  . azaTHIOprine  200 mg Oral Daily  . divalproex  500 mg Oral Daily  .  divalproex  750 mg Oral QHS  . DULoxetine  60 mg Oral Daily  . feeding supplement  237 mL Oral BID BM  . insulin aspart  0-9 Units Subcutaneous TID WC  . levothyroxine  25 mcg Oral Q0600  . melatonin  3 mg Oral QHS  . metoprolol tartrate  50 mg Oral BID  . OLANZapine  5 mg Oral BID  . tamsulosin  0.4 mg Oral Daily  . vitamin B-12  1,000 mcg Oral Daily  . Vitamin D (Ergocalciferol)  50,000 Units Oral Q7 days   Continuous Infusions:   LOS: 33 days    Time spent:15 mins, More than 50% of that time was spent in counseling and/or coordination of care.      Shelly Coss, MD [facility] P12/03/2020,  8:33 AM

## 2020-08-16 NOTE — TOC Progression Note (Addendum)
Transition of Care (TOC) - Progression Note    Patient Details  Name: EISEN ROBENSON Sr. MRN: [id number] Date of Birth: February 25, 1951  Transition of Care [facility]) CM/SW [place], [place] Phone Number: 08/16/2020, 11:43 AM  Clinical Narrative:   IVC paperwork signed by Dr Tawanna Solo was returned by Algie Coffer.  IVC due again on 12/13. Sent paperwork to [facility] for rereview for possible admission. TOC will continue to follow during the course of hospitalization.     Expected Discharge Plan: Psychiatric Hospital Barriers to Discharge: Psych Bed not available  Expected Discharge Plan and Services Expected Discharge Plan: Psychiatric Hospital In-house Referral: Clinical Social Work Discharge Planning Services: CM Consult Post Acute Care Choice: [place] arrangements for the past 2 months: Single Family Home                                       Social Determinants of Health (SDOH) Interventions    Readmission Risk Interventions Readmission Risk Prevention Plan 02/18/2019  Transportation Screening Complete  PCP or Specialist Appt within 3-5 Days Complete  HRI or [place] Complete  Social Work Consult for [place] Planning/Counseling Complete  Palliative Care Screening Not Applicable  Medication Review Press photographer) Complete  Some recent data might be hidden

## 2020-08-17 DIAGNOSIS — R410 Disorientation, unspecified: Secondary | ICD-10-CM | POA: Diagnosis not present

## 2020-08-17 LAB — GLUCOSE, CAPILLARY
Glucose-Capillary: 106 mg/dL — ABNORMAL HIGH (ref 70–99)
Glucose-Capillary: 175 mg/dL — ABNORMAL HIGH (ref 70–99)
Glucose-Capillary: 84 mg/dL (ref 70–99)
Glucose-Capillary: 157 mg/dL — ABNORMAL HIGH (ref 70–99)

## 2020-08-17 NOTE — TOC Progression Note (Addendum)
Transition of Care (TOC) - Progression Note    Patient Details  Name: [name] HOBAN Sr. MRN: [id number] Date of Birth: 10-Nov-1950  Transition of Care [facility]) CM/SW [place], [place] Phone Number: 08/17/2020, 3:52 PM  Clinical Narrative:   Damaris Schooner with [facility] at [facility].  The MD has not reviewed the paperwork I sent over yesterday because they have no open beds.  However, she did tell me that the paperwork seems to indicate the primary problem is worsening dementia, and that the MD there has been denying all referrals of that sort.   I called [facility] and left a message for admissions there. TOC will continue to follow during the course of hospitalization.  Addendum:  Patient would be eligible for [facility] facility, but due to [place] they are not admitting, and based on wait list anticipate it will be a year before someone who is referred now would get in.     Expected Discharge Plan: Psychiatric Hospital Barriers to Discharge: Psych Bed not available  Expected Discharge Plan and Services Expected Discharge Plan: Psychiatric Hospital In-house Referral: Clinical Social Work Discharge Planning Services: CM Consult Post Acute Care Choice: [place] arrangements for the past 2 months: Single Family Home                                       Social Determinants of Health (SDOH) Interventions    Readmission Risk Interventions Readmission Risk Prevention Plan 02/18/2019  Transportation Screening Complete  PCP or Specialist Appt within 3-5 Days Complete  HRI or [place] Complete  Social Work Consult for [place] Planning/Counseling Complete  Palliative Care Screening Not Applicable  Medication Review Press photographer) Complete  Some recent data might be hidden

## 2020-08-17 NOTE — Progress Notes (Signed)
PROGRESS NOTE    [name] PARAS Sr.  BWG:[id number] DOB: [birth year] DOA: 07/13/2020 PCP: [name], DO    Chief Complaint  Patient presents with  . increase confusion    Brief Narrative:  69 year old gentleman prior history of atrial fibrillation on anticoagulation, dementia and depression brought to ED for increasing agitation. As per the family ,he has been progressively having worsening dementia since last 2 years. Multiple medications have been attempted in the outpatient setting without any improvement. His initial CT of the head on admission was negative for acute findings.Hospital course remarkable for persistent agitation requiring soft restraints. Wife is in the process of initiating medicaid for him and is looking for long term residence.   Social worker closely following for disposition.  Patient is on wait list at geropsych unit.  Subjective: No interval changes.  Medically stable to discharge ,waiting for placement  Assessment & Plan:   Active Problems:   Major depression, recurrent, chronic (HCC)   Essential hypertension   GAD (generalized anxiety disorder)   Dementia with behavioral disturbance (HCC)   Atrial fibrillation with rapid ventricular response (HCC)   Hypokalemia   AMS (altered mental status)   Acute delirium   Hypothyroidism   Vitamin B 12 deficiency  1. Acute delirium superimposed on progressive chronic dementia Initially precipitated by recent death in the family about 2 years ago. Head CT done ,negative for any acute abnormalities. MRI unable to be done as patient not cooperative. Patient with no focal neurological symptoms.Alcohol level negative on admission. Ammonia,TSH ,Thiamine,Cortisol levels normal. RPR nonreactive. Patient with low vitamin B12 which is being supplemented. Patient was seen in consultation by psychiatry who initially recommended medical management. Patient started on Seroquel 25 mg nightly which has been increased  to 50 mg without any significant improvement. Seroquel was discontinued and patient started on Zyprexa 5 mg at bedtime. Patient was also started on depakote. Patient with agitation and aggressiveness still requiring four-point restraints. Due to ongoing agitation, psychiatry was reconsulted and patient seen by psychiatry who recommended adjustment of medications. Psychiatry recommending inpatient psychiatric admission/neuropsych inpatient admission.Patient currently medically stable for discharge . Social worker closely following for disposition.  Patient is on wait list for [facility] gero psych unit.No bed is available yet.  2. Chronic atrial fibrillation On Lopressor for rate control. Eliquis for anticoagulation.  Outpatient follow-up with cardiology for further discussions on ongoing anticoagulation due to history of progressive worsening dementia.  3. Prolonged QTC Improved. Monitor electrolytes  4. Type 2 diabetes mellitus well controlled Hemoglobin A1c 6.9  as per 06/30/2020. On sliding scale insulin.   5. Hypothyroidism On Synthroid.   6. Hypertension Monitor BP .On  Norvasc, Lopressor.   7. Depression/anxiety Continue current medications. Psychiatry was following. Needs inpatient psych admission.   8. Vitamin B12 deficiency Continue vitamin B12 supplementation.   9. Vitamin D deficiency Continue Vitamin D supplementation.   10. History of ulcerative colitis Continue Imuran. Needs outpatient follow-up with GI.   11.  Urine retention Needing in and out intermittently .  Continue tamsulosin     DVT prophylaxis:  apixaban (ELIQUIS) tablet 5 mg   Code Status: DNR Family Communication: Patient Disposition:   Status is: Inpatient  Dispo: The patient is from: Home              Anticipated d/c is to: [facility] psych              Anticipated d/c date is: Medically stable to discharge, awaiting for placement  Consultants:    Psychiatry  Procedures:   None  Antimicrobials:   None     Objective: Vitals:   08/16/20 2250 08/17/20 0611 08/17/20 1010 08/17/20 1612  BP: (!) 125/57 112/70 107/71 90/61  Pulse: 61 94 100 60  Resp:  16 20 17   Temp:  98 F (36.7 C) 97.7 F (36.5 C) 97.6 F (36.4 C)  TempSrc:  Oral Oral Oral  SpO2:  99% 97% 95%  Weight:      Height:        Intake/Output Summary (Last 24 hours) at 08/17/2020 1911 Last data filed at 08/17/2020 1748 Gross per 24 hour  Intake 720 ml  Output 140 ml  Net 580 ml   Filed Weights   07/20/20 0922  Weight: 115.2 kg    Examination:  General exam: Calm, pleasantly confused, not oriented to time or place, in restraints Respiratory system: Clear to auscultation. Respiratory effort normal. Cardiovascular system: S1 & S2 heard, RRR. No JVD, no murmur, No pedal edema. Gastrointestinal system: Abdomen is nondistended, soft and nontender. Normal bowel sounds heard. Central nervous system: Confused Extremities: In restraints, no edema Skin: No rashes, lesions or ulcers Psychiatry: Confused, currently pleasant and cooperative    Data Reviewed: I have personally reviewed following labs and imaging studies  CBC: No results for input(s): WBC, NEUTROABS, HGB, HCT, MCV, PLT in the last 168 hours.  Basic Metabolic Panel: No results for input(s): NA, K, CL, CO2, GLUCOSE, BUN, CREATININE, CALCIUM, MG, PHOS in the last 168 hours.  GFR: Estimated Creatinine Clearance: 78.2 mL/min (by C-G formula based on SCr of 1.05 mg/dL).  Liver Function Tests: No results for input(s): AST, ALT, ALKPHOS, BILITOT, PROT, ALBUMIN in the last 168 hours.  CBG: Recent Labs  Lab 08/16/20 1617 08/16/20 2239 08/17/20 0818 08/17/20 1117 08/17/20 1654  GLUCAP 113* 91 106* 175* 84     No results found for this or any previous visit (from the past 240 hour(s)).       Radiology Studies: No results found.      Scheduled Meds: . amLODipine  5 mg Oral  Daily  . apixaban  5 mg Oral BID  . atorvastatin  20 mg Oral Daily  . azaTHIOprine  200 mg Oral Daily  . divalproex  500 mg Oral Daily  . divalproex  750 mg Oral QHS  . DULoxetine  60 mg Oral Daily  . feeding supplement  237 mL Oral BID BM  . insulin aspart  0-9 Units Subcutaneous TID WC  . levothyroxine  25 mcg Oral Q0600  . melatonin  3 mg Oral QHS  . metoprolol tartrate  50 mg Oral BID  . OLANZapine  5 mg Oral BID  . tamsulosin  0.4 mg Oral Daily  . vitamin B-12  1,000 mcg Oral Daily  . Vitamin D (Ergocalciferol)  50,000 Units Oral Q7 days   Continuous Infusions:   LOS: 34 days   Time spent: 40mns Greater than 50% of this time was spent in counseling, explanation of diagnosis, planning of further management, and coordination of care.  I have personally reviewed and interpreted on  08/17/2020 daily labs, I reviewed all nursing notes, pharmacy notes,  vitals, pertinent old records  I have discussed plan of care as described above with RN , patient  on 08/17/2020  Voice Recognition /Dragon dictation system was used to create this note, attempts have been made to correct errors. Please contact the author with questions and/or clarifications.   FFlorencia Reasons MD  PhD FACP [facility]  Available via Epic secure chat 7am-7pm for nonurgent issues Please page for urgent issues To page the attending provider between 7A-7P or the covering provider during after hours 7P-7A, please log into the web site [email or web address] and access using universal [place] password for that web site. If you do not have the password, please call the hospital operator.    08/17/2020, 7:11 PM

## 2020-08-17 NOTE — Plan of Care (Signed)
  Problem: Safety: Goal: Ability to remain free from injury will improve Outcome: Progressing   Problem: Skin Integrity: Goal: Risk for impaired skin integrity will decrease Outcome: Progressing   Problem: Health Behavior/Discharge Planning: Goal: Ability to manage health-related needs will improve Outcome: Progressing

## 2020-08-18 DIAGNOSIS — R410 Disorientation, unspecified: Secondary | ICD-10-CM | POA: Diagnosis not present

## 2020-08-18 LAB — GLUCOSE, CAPILLARY
Glucose-Capillary: 117 mg/dL — ABNORMAL HIGH (ref 70–99)
Glucose-Capillary: 114 mg/dL — ABNORMAL HIGH (ref 70–99)
Glucose-Capillary: 110 mg/dL — ABNORMAL HIGH (ref 70–99)
Glucose-Capillary: 98 mg/dL (ref 70–99)

## 2020-08-18 LAB — COMPREHENSIVE METABOLIC PANEL
ALT: 16 U/L (ref 0–44)
AST: 29 U/L (ref 15–41)
Albumin: 2.7 g/dL — ABNORMAL LOW (ref 3.5–5.0)
Alkaline Phosphatase: 53 U/L (ref 38–126)
Anion gap: 9 (ref 5–15)
BUN: 19 mg/dL (ref 8–23)
CO2: 28 mmol/L (ref 22–32)
Calcium: 8.2 mg/dL — ABNORMAL LOW (ref 8.9–10.3)
Chloride: 100 mmol/L (ref 98–111)
Creatinine, Ser: 0.8 mg/dL (ref 0.61–1.24)
GFR, Estimated: >60 mL/min (ref >60)
Glucose, Bld: 103 mg/dL — ABNORMAL HIGH (ref 70–99)
Potassium: 3.4 mmol/L — ABNORMAL LOW (ref 3.5–5.1)
Sodium: 137 mmol/L (ref 135–145)
Total Bilirubin: 1.5 mg/dL — ABNORMAL HIGH (ref 0.3–1.2)
Total Protein: 5.4 g/dL — ABNORMAL LOW (ref 6.5–8.1)

## 2020-08-18 LAB — CBC WITH DIFFERENTIAL/PLATELET
Abs Immature Granulocytes: 0.08 10*3/uL — ABNORMAL HIGH (ref 0.00–0.07)
Basophils Absolute: 0 10*3/uL (ref 0.0–0.1)
Basophils Relative: 1 %
Eosinophils Absolute: 0.3 10*3/uL (ref 0.0–0.5)
Eosinophils Relative: 7 %
HCT: 37.9 % — ABNORMAL LOW (ref 39.0–52.0)
Hemoglobin: 12.6 g/dL — ABNORMAL LOW (ref 13.0–17.0)
Immature Granulocytes: 2 %
Lymphocytes Relative: 14 %
Lymphs Abs: 0.7 10*3/uL (ref 0.7–4.0)
MCH: 33.9 pg (ref 26.0–34.0)
MCHC: 33.2 g/dL (ref 30.0–36.0)
MCV: 101.9 fL — ABNORMAL HIGH (ref 80.0–100.0)
Monocytes Absolute: 0.5 10*3/uL (ref 0.1–1.0)
Monocytes Relative: 10 %
Neutro Abs: 3.4 10*3/uL (ref 1.7–7.7)
Neutrophils Relative %: 66 %
Platelets: 107 10*3/uL — ABNORMAL LOW (ref 150–400)
RBC: 3.72 MIL/uL — ABNORMAL LOW (ref 4.22–5.81)
RDW: 16.2 % — ABNORMAL HIGH (ref 11.5–15.5)
WBC: 5.1 10*3/uL (ref 4.0–10.5)
nRBC: 0 % (ref 0.0–0.2)

## 2020-08-18 LAB — MAGNESIUM: Magnesium: 1.8 mg/dL (ref 1.7–2.4)

## 2020-08-18 NOTE — Plan of Care (Signed)
  Problem: Health Behavior/Discharge Planning: Goal: Ability to manage health-related needs will improve Outcome: Progressing   Problem: Coping: Goal: Level of anxiety will decrease Outcome: Progressing   Problem: Safety: Goal: Ability to remain free from injury will improve Outcome: Progressing   Problem: Skin Integrity: Goal: Risk for impaired skin integrity will decrease Outcome: Progressing

## 2020-08-18 NOTE — Progress Notes (Signed)
PROGRESS NOTE    [name] NOAH Sr.  IWP:[id number] DOB: 10-03-1950 DOA: 07/13/2020 PCP: [name], DO    Chief Complaint  Patient presents with  . increase confusion    Brief Narrative:  69 year old gentleman prior history of atrial fibrillation on anticoagulation, dementia and depression brought to ED for increasing agitation. As per the family ,he has been progressively having worsening dementia since last 2 years. Multiple medications have been attempted in the outpatient setting without any improvement. His initial CT of the head on admission was negative for acute findings.Hospital course remarkable for persistent agitation requiring soft restraints. Wife is in the process of initiating medicaid for him and is looking for long term residence.   Social worker closely following for disposition.  Patient is on wait list at geropsych unit.  Subjective: No interval changes.  Medically stable to discharge ,waiting for placement s  Assessment & Plan:   Active Problems:   Major depression, recurrent, chronic (HCC)   Essential hypertension   GAD (generalized anxiety disorder)   Dementia with behavioral disturbance (HCC)   Atrial fibrillation with rapid ventricular response (HCC)   Hypokalemia   AMS (altered mental status)   Acute delirium   Hypothyroidism   Vitamin B 12 deficiency  1. Acute delirium superimposed on progressive chronic dementia Initially precipitated by recent death in the family about 2 years ago. Head CT done ,negative for any acute abnormalities. MRI unable to be done as patient not cooperative. Patient with no focal neurological symptoms.Alcohol level negative on admission. Ammonia,TSH ,Thiamine,Cortisol levels normal. RPR nonreactive. Patient with low vitamin B12 which is being supplemented. Patient was seen in consultation by psychiatry who initially recommended medical management. Patient started on Seroquel 25 mg nightly which has been  increased to 50 mg without any significant improvement. Seroquel was discontinued and patient started on Zyprexa 5 mg at bedtime. Patient was also started on depakote. Patient with agitation and aggressiveness still requiring four-point restraints. Due to ongoing agitation, psychiatry was reconsulted and patient seen by psychiatry who recommended adjustment of medications. Psychiatry recommending inpatient psychiatric admission/neuropsych inpatient admission.Patient currently medically stable for discharge . Social worker closely following for disposition.  Patient is on wait list for [facility] gero psych unit.No bed is available yet.  2. Chronic atrial fibrillation On Lopressor for rate control. Eliquis for anticoagulation.  Outpatient follow-up with cardiology for further discussions on ongoing anticoagulation due to history of progressive worsening dementia.  3. Prolonged QTC Improved. Monitor electrolytes  4. Type 2 diabetes mellitus well controlled Hemoglobin A1c 6.9  as per 06/30/2020. On sliding scale insulin.   5. Hypothyroidism On Synthroid.   6. Hypertension Monitor BP .On  Norvasc, Lopressor.   7. Depression/anxiety Continue current medications. Psychiatry was following. Needs inpatient psych admission.   8. Vitamin B12 deficiency Continue vitamin B12 supplementation.   9. Vitamin D deficiency Continue Vitamin D supplementation.   10. History of ulcerative colitis Continue Imuran. Needs outpatient follow-up with GI.   11.  Urine retention Needing in and out intermittently .  Continue tamsulosin     DVT prophylaxis:  apixaban (ELIQUIS) tablet 5 mg   Code Status: DNR Family Communication: Patient Disposition:   Status is: Inpatient  Dispo: The patient is from: Home              Anticipated d/c is to: [facility] psych              Anticipated d/c date is: Medically stable to discharge, awaiting for placement  Consultants:    Psychiatry  Procedures:   None  Antimicrobials:   None     Objective: Vitals:   08/17/20 2114 08/17/20 2118 08/17/20 2125 08/18/20 0533  BP: 103/76 (!) 93/52 103/76 (!) 112/54  Pulse: 92 60 92 94  Resp:  18 18 16   Temp:  97.9 F (36.6 C)  98.2 F (36.8 C)  TempSrc:  Oral  Oral  SpO2:  99%  98%  Weight:      Height:        Intake/Output Summary (Last 24 hours) at 08/18/2020 1217 Last data filed at 08/18/2020 0948 Gross per 24 hour  Intake 1074 ml  Output 300 ml  Net 774 ml   Filed Weights   07/20/20 0922  Weight: 115.2 kg    Examination:  General exam: Calm, pleasantly confused, not oriented to time or place, in restraints Respiratory system: Clear to auscultation. Respiratory effort normal. Cardiovascular system: S1 & S2 heard, RRR. No JVD, no murmur, No pedal edema. Gastrointestinal system: Abdomen is nondistended, soft and nontender. Normal bowel sounds heard. Central nervous system: Confused Extremities: In restraints, no edema Skin: No rashes, lesions or ulcers Psychiatry: Confused, currently pleasant and cooperative    Data Reviewed: I have personally reviewed following labs and imaging studies  CBC: Recent Labs  Lab 08/18/20 0408  WBC 5.1  NEUTROABS 3.4  HGB 12.6*  HCT 37.9*  MCV 101.9*  PLT 107*    Basic Metabolic Panel: Recent Labs  Lab 08/18/20 0408  NA 137  K 3.4*  CL 100  CO2 28  GLUCOSE 103*  BUN 19  CREATININE 0.80  CALCIUM 8.2*  MG 1.8    GFR: Estimated Creatinine Clearance: 102.7 mL/min (by C-G formula based on SCr of 0.8 mg/dL).  Liver Function Tests: Recent Labs  Lab 08/18/20 0408  AST 29  ALT 16  ALKPHOS 53  BILITOT 1.5*  PROT 5.4*  ALBUMIN 2.7*    CBG: Recent Labs  Lab 08/17/20 1117 08/17/20 1654 08/17/20 2023 08/18/20 0738 08/18/20 1142  GLUCAP 175* 84 157* 117* 114*     No results found for this or any previous visit (from the past 240 hour(s)).       Radiology Studies: No results  found.      Scheduled Meds: . amLODipine  5 mg Oral Daily  . apixaban  5 mg Oral BID  . atorvastatin  20 mg Oral Daily  . azaTHIOprine  200 mg Oral Daily  . divalproex  500 mg Oral Daily  . divalproex  750 mg Oral QHS  . DULoxetine  60 mg Oral Daily  . feeding supplement  237 mL Oral BID BM  . insulin aspart  0-9 Units Subcutaneous TID WC  . levothyroxine  25 mcg Oral Q0600  . melatonin  3 mg Oral QHS  . metoprolol tartrate  50 mg Oral BID  . OLANZapine  5 mg Oral BID  . tamsulosin  0.4 mg Oral Daily  . vitamin B-12  1,000 mcg Oral Daily  . Vitamin D (Ergocalciferol)  50,000 Units Oral Q7 days   Continuous Infusions:   LOS: 35 days   Time spent: 59mns Greater than 50% of this time was spent in counseling, explanation of diagnosis, planning of further management, and coordination of care.  I have personally reviewed and interpreted on  08/18/2020 daily labs, I reviewed all nursing notes, pharmacy notes,  vitals, pertinent old records  I have discussed plan of care as described above with RN ,  patient  on 08/18/2020  Voice Recognition /Dragon dictation system was used to create this note, attempts have been made to correct errors. Please contact the author with questions and/or clarifications.   Florencia Reasons, MD PhD FACP [facility]  Available via Epic secure chat 7am-7pm for nonurgent issues Please page for urgent issues To page the attending provider between 7A-7P or the covering provider during after hours 7P-7A, please log into the web site [email or web address] and access using universal [place] password for that web site. If you do not have the password, please call the hospital operator.    08/18/2020, 12:17 PM

## 2020-08-18 NOTE — TOC Progression Note (Addendum)
Transition of Care (TOC) - Progression Note    Patient Details  Name: AHREN PETTINGER Sr. MRN: [id number] Date of Birth: 1951/04/11  Transition of Care [facility]) CM/SW [place], [place] Phone Number: 08/18/2020, 3:37 PM  Clinical Narrative:  Found patient in bed in soft restraints of arms, enageable.  When asked how long he hs been here in the hospital, he responds that he is not in the hospital, he is "On the clock."  Unable to carry on meaningful conversation. Oriented to self only.  Unable to eat solids due to lack of dentures, or due to dementia. Being fed soft foods due to restraints.  When asked what happens when restraints come off, sitter responded that when he tries to get out of bed and is redirected, he starts swinging. TOC will continue to follow during the course of hospitalization.      Expected Discharge Plan: Psychiatric Hospital Barriers to Discharge: Psych Bed not available  Expected Discharge Plan and Services Expected Discharge Plan: Psychiatric Hospital In-house Referral: Clinical Social Work Discharge Planning Services: CM Consult Post Acute Care Choice: [place] arrangements for the past 2 months: Single Family Home                                       Social Determinants of Health (SDOH) Interventions    Readmission Risk Interventions Readmission Risk Prevention Plan 02/18/2019  Transportation Screening Complete  PCP or Specialist Appt within 3-5 Days Complete  HRI or [place] Complete  Social Work Consult for [place] Planning/Counseling Complete  Palliative Care Screening Not Applicable  Medication Review Press photographer) Complete  Some recent data might be hidden

## 2020-08-19 LAB — GLUCOSE, CAPILLARY
Glucose-Capillary: 102 mg/dL — ABNORMAL HIGH (ref 70–99)
Glucose-Capillary: 112 mg/dL — ABNORMAL HIGH (ref 70–99)
Glucose-Capillary: 139 mg/dL — ABNORMAL HIGH (ref 70–99)
Glucose-Capillary: 177 mg/dL — ABNORMAL HIGH (ref 70–99)

## 2020-08-19 MED ORDER — ENSURE ENLIVE PO LIQD
237.0000 mL | Freq: Three times a day (TID) | ORAL | Status: DC
  Administered 2020-08-19 – 2020-08-27 (×20): 237 mL via ORAL

## 2020-08-19 NOTE — Progress Notes (Signed)
PROGRESS NOTE    [name] ERRICO Sr.  BHA:[id number] DOB: 07/31/1951 DOA: 07/13/2020 PCP: [name], DO    Chief Complaint  Patient presents with  . increase confusion    Brief Narrative:  69 year old gentleman prior history of atrial fibrillation on anticoagulation, dementia and depression brought to ED for increasing agitation. As per the family ,he has been progressively having worsening dementia since last 2 years. Multiple medications have been attempted in the outpatient setting without any improvement. His initial CT of the head on admission was negative for acute findings.Hospital course remarkable for persistent agitation requiring soft restraints. Wife is in the process of initiating medicaid for him and is looking for long term residence.   Social worker closely following for disposition.  Patient is on wait list at geropsych unit.  Subjective: No interval changes.  Medically stable to discharge ,waiting for placement  Remain confused and inappropriate  Wife at bedside , she is concerned that patient is loosing weight and not eating enough, will check weight and do calorie count  Assessment & Plan:   Active Problems:   Major depression, recurrent, chronic (HCC)   Essential hypertension   GAD (generalized anxiety disorder)   Dementia with behavioral disturbance (HCC)   Atrial fibrillation with rapid ventricular response (HCC)   Hypokalemia   AMS (altered mental status)   Acute delirium   Hypothyroidism   Vitamin B 12 deficiency  1. Acute delirium superimposed on progressive chronic dementia Initially precipitated by recent death in the family about 2 years ago. Head CT done ,negative for any acute abnormalities. MRI unable to be done as patient not cooperative. Patient with no focal neurological symptoms.Alcohol level negative on admission. Ammonia,TSH ,Thiamine,Cortisol levels normal. RPR nonreactive. Patient with low vitamin B12 which is being  supplemented. Patient was seen in consultation by psychiatry who initially recommended medical management. Patient started on Seroquel 25 mg nightly which has been increased to 50 mg without any significant improvement. Seroquel was discontinued and patient started on Zyprexa 5 mg at bedtime. Patient was also started on depakote. Patient with agitation and aggressiveness still requiring four-point restraints. Due to ongoing agitation, psychiatry was reconsulted and patient seen by psychiatry who recommended adjustment of medications. Psychiatry recommending inpatient psychiatric admission/neuropsych inpatient admission.Patient currently medically stable for discharge . Social worker closely following for disposition.  Patient is on wait list for [facility] gero psych unit.No bed is available yet.  Per RN "Assessed pt to determine if would be able to trial out of bilateral wrist restraints and place in recliner.  Pt has history of being sexually aggressive towards staff.  Upon assessing pt to remove restraints; talked with pt about release guidelines and patient began talking about having sex with women.  Determined no staff to trial out of restraints at that time"  2. Chronic atrial fibrillation On Lopressor for rate control. Eliquis for anticoagulation.  Outpatient follow-up with cardiology for further discussions on ongoing anticoagulation due to history of progressive worsening dementia.  3. Prolonged QTC Improved. Monitor electrolytes  4. Type 2 diabetes mellitus well controlled Hemoglobin A1c 6.9  as per 06/30/2020. On sliding scale insulin.   5. Hypothyroidism On Synthroid.   6. Hypertension Monitor BP .On  Norvasc, Lopressor.   7. Depression/anxiety Continue current medications. Psychiatry was following. Needs inpatient psych admission.   8. Vitamin B12 deficiency Continue vitamin B12 supplementation.   9. Vitamin D deficiency Continue Vitamin D  supplementation.   10. History of ulcerative colitis Continue Imuran. Needs  outpatient follow-up with GI.   11.  Urine retention Needing in and out intermittently .  Continue tamsulosin     DVT prophylaxis:  apixaban (ELIQUIS) tablet 5 mg   Code Status: DNR Family Communication: Patient Disposition:   Status is: Inpatient  Dispo: The patient is from: Home              Anticipated d/c is to: [facility] psych              Anticipated d/c date is: Medically stable to discharge, awaiting for placement                Consultants:   Psychiatry  Procedures:   None  Antimicrobials:   None     Objective: Vitals:   08/18/20 1336 08/18/20 2044 08/18/20 2208 08/19/20 0723  BP: 99/66 129/68 114/60 (!) 118/99  Pulse: 70 (!) 101 (!) 58 96  Resp: 20 19  18   Temp: 97.9 F (36.6 C) 98.5 F (36.9 C)  97.8 F (36.6 C)  TempSrc: Oral   Oral  SpO2: 100% 98%  98%  Weight:      Height:        Intake/Output Summary (Last 24 hours) at 08/19/2020 1343 Last data filed at 08/18/2020 1741 Gross per 24 hour  Intake -  Output 450 ml  Net -450 ml   Filed Weights   07/20/20 0922  Weight: 115.2 kg    Examination:  General exam: Calm, pleasantly confused, not oriented to time or place, in restraints Respiratory system: Clear to auscultation. Respiratory effort normal. Cardiovascular system: S1 & S2 heard, RRR. No JVD, no murmur, No pedal edema. Gastrointestinal system: Abdomen is nondistended, soft and nontender. Normal bowel sounds heard. Central nervous system: Confused Extremities: In restraints, no edema Skin: No rashes, lesions or ulcers Psychiatry: Confused, currently pleasant and cooperative    Data Reviewed: I have personally reviewed following labs and imaging studies  CBC: Recent Labs  Lab 08/18/20 0408  WBC 5.1  NEUTROABS 3.4  HGB 12.6*  HCT 37.9*  MCV 101.9*  PLT 107*    Basic Metabolic Panel: Recent Labs  Lab 08/18/20 0408  NA 137  K 3.4*  CL  100  CO2 28  GLUCOSE 103*  BUN 19  CREATININE 0.80  CALCIUM 8.2*  MG 1.8    GFR: Estimated Creatinine Clearance: 102.7 mL/min (by C-G formula based on SCr of 0.8 mg/dL).  Liver Function Tests: Recent Labs  Lab 08/18/20 0408  AST 29  ALT 16  ALKPHOS 53  BILITOT 1.5*  PROT 5.4*  ALBUMIN 2.7*    CBG: Recent Labs  Lab 08/18/20 1142 08/18/20 1644 08/18/20 2049 08/19/20 0805 08/19/20 1149  GLUCAP 114* 110* 98 102* 112*     No results found for this or any previous visit (from the past 240 hour(s)).       Radiology Studies: No results found.      Scheduled Meds: . amLODipine  5 mg Oral Daily  . apixaban  5 mg Oral BID  . atorvastatin  20 mg Oral Daily  . azaTHIOprine  200 mg Oral Daily  . divalproex  500 mg Oral Daily  . divalproex  750 mg Oral QHS  . DULoxetine  60 mg Oral Daily  . feeding supplement  237 mL Oral BID BM  . insulin aspart  0-9 Units Subcutaneous TID WC  . levothyroxine  25 mcg Oral Q0600  . melatonin  3 mg Oral QHS  . metoprolol tartrate  50 mg Oral BID  . OLANZapine  5 mg Oral BID  . tamsulosin  0.4 mg Oral Daily  . vitamin B-12  1,000 mcg Oral Daily  . Vitamin D (Ergocalciferol)  50,000 Units Oral Q7 days   Continuous Infusions:   LOS: 36 days   Time spent: 16mns Greater than 50% of this time was spent in counseling, explanation of diagnosis, planning of further management, and coordination of care.  I have personally reviewed and interpreted on  08/19/2020 daily labs, I reviewed all nursing notes, pharmacy notes,  vitals, pertinent old records  I have discussed plan of care as described above with RN , patient  on 08/19/2020  Voice Recognition /Dragon dictation system was used to create this note, attempts have been made to correct errors. Please contact the author with questions and/or clarifications.   FFlorencia Reasons MD PhD FACP [facility]  Available via Epic secure chat 7am-7pm for nonurgent issues Please page  for urgent issues To page the attending provider between 7A-7P or the covering provider during after hours 7P-7A, please log into the web site [email or web address] and access using universal [place] password for that web site. If you do not have the password, please call the hospital operator.    08/19/2020, 1:43 PM

## 2020-08-19 NOTE — Progress Notes (Signed)
Initial Nutrition Assessment  DOCUMENTATION CODES:   Obesity unspecified  INTERVENTION:   -Calorie Count -results available 12/13 -Continue Ensure Enlive po TID, each supplement provides 350 kcal and 20 grams of protein -Magic cup TID with meals, each supplement provides 290 kcal and 9 grams of protein   NUTRITION DIAGNOSIS:   Inadequate oral intake related to poor appetite (AMS) as evidenced by meal completion < 50%.  GOAL:   Patient will meet greater than or equal to 90% of their needs  MONITOR:   PO intake,Supplement acceptance,Labs,Weight trends,I & O's  REASON FOR ASSESSMENT:   Consult Calorie Count  ASSESSMENT:   69 year old gentleman prior history of atrial fibrillation on anticoagulation, dementia and depression brought to ED for increasing agitation.  As per the family ,he has been progressively having worsening dementia since last 2 years. Multiple medications have been attempted in the outpatient setting without any improvement.  His initial CT of the head on admission was negative for acute findings.Hospital course remarkable for persistent agitation requiring soft restraints.  Per MD note, pt is medically stable for discharge but pt awaiting geripsych bed.  Pt's wife has been concerned about pt's PO intake and if pt is losing weight.  Pt with confusion. Safety sitter at bedside and pt is in restraints.   Pt has been consuming 25-50% of meals. Added instructions to Calorie Count and will have results on 12/13. Ensure supplements have been ordered since 11/23. Pt is drinking them most of the time. Will increase to TID and add Magic cups to meals. Pt with difficulty chewing d/t no dentures.  Last weight measured 11/10. AMS/aggression is most likely reason for no updated weights. Noted that order for daily weights was placed.  Medications: Vitamin B-12, Vitamin D  Labs reviewed: CBGs: 102-139  NUTRITION - FOCUSED PHYSICAL EXAM:  Deferred  Diet Order:    Diet Order            Diet Carb Modified Fluid consistency: Thin; Room service appropriate? Yes  Diet effective now                 EDUCATION NEEDS:   No education needs have been identified at this time  Skin:  Skin Assessment: Reviewed RN Assessment  Last BM:  12/8 -type 2  Height:   Ht Readings from Last 1 Encounters:  07/20/20 5' 7"  (1.702 m)    Weight:   Wt Readings from Last 1 Encounters:  07/20/20 115.2 kg   BMI:  Body mass index is 39.78 kg/m.  Estimated Nutritional Needs:   Kcal:  1950-2150  Protein:  100-110g  Fluid:  2L/day  Clayton Bibles, MS, RD, LDN Inpatient Clinical Dietitian Contact information available via Amion

## 2020-08-19 NOTE — Progress Notes (Signed)
Patient remains in restraints throughout this shift.  Assessment of skin done with toileting.  Patient confused; wife at bedside.  Patient was able to recognize wife.  Oriented to self and wife.  Otherwise patient is pleasantly confused.  No aggressive behavior noted this shift.  Patient speaking loudly however wife states this is due to patient not being able to hear and is without his hearing aids.  1:1 Sitter remains at bedside for redirection of patient's care.  Will continue to monitor.

## 2020-08-19 NOTE — Progress Notes (Signed)
Assessed pt to determine if would be able to trial out of bilateral wrist restraints and place in recliner.  Pt has history of being sexually aggressive towards staff.  Upon assessing pt to remove restraints; talked with pt about release guidelines and patient began talking about having sex with women.  Determined no staff to trial out of restraints at that time.

## 2020-08-20 LAB — COMPREHENSIVE METABOLIC PANEL
ALT: 16 U/L (ref 0–44)
AST: 24 U/L (ref 15–41)
Albumin: 2.7 g/dL — ABNORMAL LOW (ref 3.5–5.0)
Alkaline Phosphatase: 57 U/L (ref 38–126)
Anion gap: 10 (ref 5–15)
BUN: 12 mg/dL (ref 8–23)
CO2: 28 mmol/L (ref 22–32)
Calcium: 8.5 mg/dL — ABNORMAL LOW (ref 8.9–10.3)
Chloride: 101 mmol/L (ref 98–111)
Creatinine, Ser: 0.92 mg/dL (ref 0.61–1.24)
GFR, Estimated: >60 mL/min (ref >60)
Glucose, Bld: 115 mg/dL — ABNORMAL HIGH (ref 70–99)
Potassium: 3.3 mmol/L — ABNORMAL LOW (ref 3.5–5.1)
Sodium: 139 mmol/L (ref 135–145)
Total Bilirubin: 0.8 mg/dL (ref 0.3–1.2)
Total Protein: 5.5 g/dL — ABNORMAL LOW (ref 6.5–8.1)

## 2020-08-20 LAB — CBC WITH DIFFERENTIAL/PLATELET
Abs Immature Granulocytes: 0.15 10*3/uL — ABNORMAL HIGH (ref 0.00–0.07)
Basophils Absolute: 0 10*3/uL (ref 0.0–0.1)
Basophils Relative: 1 %
Eosinophils Absolute: 0.2 10*3/uL (ref 0.0–0.5)
Eosinophils Relative: 4 %
HCT: 38.4 % — ABNORMAL LOW (ref 39.0–52.0)
Hemoglobin: 12.8 g/dL — ABNORMAL LOW (ref 13.0–17.0)
Immature Granulocytes: 3 %
Lymphocytes Relative: 14 %
Lymphs Abs: 0.8 10*3/uL (ref 0.7–4.0)
MCH: 33.4 pg (ref 26.0–34.0)
MCHC: 33.3 g/dL (ref 30.0–36.0)
MCV: 100.3 fL — ABNORMAL HIGH (ref 80.0–100.0)
Monocytes Absolute: 0.6 10*3/uL (ref 0.1–1.0)
Monocytes Relative: 9 %
Neutro Abs: 4.1 10*3/uL (ref 1.7–7.7)
Neutrophils Relative %: 69 %
Platelets: 127 10*3/uL — ABNORMAL LOW (ref 150–400)
RBC: 3.83 MIL/uL — ABNORMAL LOW (ref 4.22–5.81)
RDW: 16.3 % — ABNORMAL HIGH (ref 11.5–15.5)
WBC: 5.9 10*3/uL (ref 4.0–10.5)
nRBC: 0 % (ref 0.0–0.2)

## 2020-08-20 LAB — GLUCOSE, CAPILLARY
Glucose-Capillary: 110 mg/dL — ABNORMAL HIGH (ref 70–99)
Glucose-Capillary: 129 mg/dL — ABNORMAL HIGH (ref 70–99)
Glucose-Capillary: 108 mg/dL — ABNORMAL HIGH (ref 70–99)
Glucose-Capillary: 162 mg/dL — ABNORMAL HIGH (ref 70–99)

## 2020-08-20 LAB — MAGNESIUM: Magnesium: 1.9 mg/dL (ref 1.7–2.4)

## 2020-08-20 MED ORDER — POTASSIUM CHLORIDE 20 MEQ PO PACK
40.0000 meq | PACK | Freq: Once | ORAL | Status: AC
  Administered 2020-08-20: 40 meq via ORAL
  Filled 2020-08-20: qty 2

## 2020-08-20 MED ORDER — POTASSIUM CHLORIDE 20 MEQ PO PACK
40.0000 meq | PACK | ORAL | Status: DC
  Administered 2020-08-22 – 2020-08-29 (×4): 40 meq via ORAL
  Filled 2020-08-20 (×5): qty 2

## 2020-08-20 MED ORDER — SENNOSIDES-DOCUSATE SODIUM 8.6-50 MG PO TABS
1.0000 | ORAL_TABLET | Freq: Every day | ORAL | Status: DC
  Administered 2020-08-20 – 2020-09-04 (×16): 1 via ORAL
  Filled 2020-08-20 (×16): qty 1

## 2020-08-20 NOTE — Plan of Care (Signed)
  Problem: Health Behavior/Discharge Planning: Goal: Ability to manage health-related needs will improve Outcome: Not Progressing   Problem: Coping: Goal: Level of anxiety will decrease Outcome: Not Progressing

## 2020-08-20 NOTE — Progress Notes (Signed)
PROGRESS NOTE    [name] KWAN Sr.  HKV:[id number] DOB: 1951/08/09 DOA: 07/13/2020 PCP: [name], DO    Chief Complaint  Patient presents with  . increase confusion    Brief Narrative:  69 year old gentleman prior history of atrial fibrillation on anticoagulation, dementia and depression brought to ED for increasing agitation. As per the family ,he has been progressively having worsening dementia since last 2 years. Multiple medications have been attempted in the outpatient setting without any improvement. His initial CT of the head on admission was negative for acute findings.Hospital course remarkable for persistent agitation requiring soft restraints. Wife is in the process of initiating medicaid for him and is looking for long term residence.   Social worker closely following for disposition.  Patient is on wait list at geropsych unit.  Subjective:  No interval changes.  He has been medically stable to discharge ,waiting for placement  Remain confused with intermittent agitation requiring four-point restraint , sitter at bedside      Wife expressed concern on 12/10 that patient is loosing weight and not eating enough,  check weight and do calorie count  Assessment & Plan:   Active Problems:   Major depression, recurrent, chronic (HCC)   Essential hypertension   GAD (generalized anxiety disorder)   Dementia with behavioral disturbance (HCC)   Atrial fibrillation with rapid ventricular response (HCC)   Hypokalemia   AMS (altered mental status)   Acute delirium   Hypothyroidism   Vitamin B 12 deficiency  1. Acute delirium superimposed on progressive chronic dementia Initially precipitated by recent death in the family about 2 years ago. Head CT done ,negative for any acute abnormalities. MRI unable to be done as patient not cooperative. Patient with no focal neurological symptoms.Alcohol level negative on admission. Ammonia,TSH ,Thiamine,Cortisol levels  normal. RPR nonreactive. Patient with low vitamin B12 which is being supplemented. Patient was seen in consultation by psychiatry who initially recommended medical management. Patient started on Seroquel 25 mg nightly which has been increased to 50 mg without any significant improvement. Seroquel was discontinued and patient started on Zyprexa 5 mg at bedtime. Patient was also started on depakote. Patient with agitation and aggressiveness still requiring four-point restraints. Due to ongoing agitation, psychiatry was reconsulted and patient seen by psychiatry who recommended adjustment of medications. Psychiatry recommending inpatient psychiatric admission/neuropsych inpatient admission.Patient currently medically stable for discharge . Social worker closely following for disposition.  Patient is on wait list for [facility] gero psych unit.No bed is available yet.  Per RN "Assessed pt to determine if would be able to trial out of bilateral wrist restraints and place in recliner.  Pt has history of being sexually aggressive towards staff.  Upon assessing pt to remove restraints; talked with pt about release guidelines and patient began talking about having sex with women.  Determined no staff to trial out of restraints at that time"  2. Chronic atrial fibrillation On Lopressor for rate control. Eliquis for anticoagulation.  Outpatient follow-up with cardiology for further discussions on ongoing anticoagulation due to history of progressive worsening dementia.  3. Prolonged QTC Improved. Monitor electrolytes  4. Type 2 diabetes mellitus well controlled Hemoglobin A1c 6.9  as per 06/30/2020. On sliding scale insulin.   5. Hypothyroidism On Synthroid.   6. Hypertension Monitor BP .On  Norvasc, Lopressor.   7. Depression/anxiety Continue current medications. Psychiatry was following. Needs inpatient psych admission.   8. Vitamin B12 deficiency Continue vitamin B12  supplementation.   9. Vitamin D deficiency  Continue Vitamin D supplementation.   10. History of ulcerative colitis Continue Imuran. Needs outpatient follow-up with GI.   11.  Urine retention Needing in and out intermittently .  Continue tamsulosin     DVT prophylaxis:  apixaban (ELIQUIS) tablet 5 mg   Code Status: DNR Family Communication: wife at bedside on 12/10 Disposition:   Status is: Inpatient  Dispo: The patient is from: Home              Anticipated d/c is to: [facility] psych              Anticipated d/c date is: Medically stable to discharge, awaiting for placement                Consultants:   Psychiatry  Procedures:   None  Antimicrobials:   None     Objective: Vitals:   08/19/20 1416 08/19/20 1653 08/19/20 2317 08/20/20 0621  BP: 92/64 112/70 132/90 129/88  Pulse: 91 98 72 98  Resp: 18 19 16 17   Temp: 97.9 F (36.6 C) 98.5 F (36.9 C) 97.9 F (36.6 C) 97.9 F (36.6 C)  TempSrc: Axillary Axillary  Oral  SpO2: 99% 98% 97% 99%  Weight:      Height:        Intake/Output Summary (Last 24 hours) at 08/20/2020 1234 Last data filed at 08/20/2020 1037 Gross per 24 hour  Intake 454 ml  Output 2251 ml  Net -1797 ml   Filed Weights   07/20/20 0922  Weight: 115.2 kg    Examination:  General exam: Calm, pleasantly confused, not oriented to time or place, in restraints Respiratory system: Clear to auscultation. Respiratory effort normal. Cardiovascular system: S1 & S2 heard, RRR. No JVD, no murmur, No pedal edema. Gastrointestinal system: Abdomen is nondistended, soft and nontender. Normal bowel sounds heard. Central nervous system: Confused Extremities: In restraints, no edema Skin: No rashes, lesions or ulcers Psychiatry: Confused, currently pleasant and cooperative    Data Reviewed: I have personally reviewed following labs and imaging studies  CBC: Recent Labs  Lab 08/18/20 0408 08/20/20 0458  WBC 5.1 5.9  NEUTROABS 3.4  4.1  HGB 12.6* 12.8*  HCT 37.9* 38.4*  MCV 101.9* 100.3*  PLT 107* 127*    Basic Metabolic Panel: Recent Labs  Lab 08/18/20 0408 08/20/20 0458  NA 137 139  K 3.4* 3.3*  CL 100 101  CO2 28 28  GLUCOSE 103* 115*  BUN 19 12  CREATININE 0.80 0.92  CALCIUM 8.2* 8.5*  MG 1.8 1.9    GFR: Estimated Creatinine Clearance: 89.3 mL/min (by C-G formula based on SCr of 0.92 mg/dL).  Liver Function Tests: Recent Labs  Lab 08/18/20 0408 08/20/20 0458  AST 29 24  ALT 16 16  ALKPHOS 53 57  BILITOT 1.5* 0.8  PROT 5.4* 5.5*  ALBUMIN 2.7* 2.7*    CBG: Recent Labs  Lab 08/19/20 1149 08/19/20 1720 08/19/20 2111 08/20/20 0801 08/20/20 1155  GLUCAP 112* 139* 177* 110* 129*     No results found for this or any previous visit (from the past 240 hour(s)).       Radiology Studies: No results found.      Scheduled Meds: . amLODipine  5 mg Oral Daily  . apixaban  5 mg Oral BID  . atorvastatin  20 mg Oral Daily  . azaTHIOprine  200 mg Oral Daily  . divalproex  500 mg Oral Daily  . divalproex  750 mg Oral QHS  . DULoxetine  60 mg Oral Daily  . feeding supplement  237 mL Oral TID BM  . insulin aspart  0-9 Units Subcutaneous TID WC  . levothyroxine  25 mcg Oral Q0600  . melatonin  3 mg Oral QHS  . metoprolol tartrate  50 mg Oral BID  . OLANZapine  5 mg Oral BID  . [START ON 08/22/2020] potassium chloride  40 mEq Oral Q M,W,F  . senna-docusate  1 tablet Oral QHS  . tamsulosin  0.4 mg Oral Daily  . vitamin B-12  1,000 mcg Oral Daily  . Vitamin D (Ergocalciferol)  50,000 Units Oral Q7 days   Continuous Infusions:   LOS: 37 days   Time spent: 26mns Greater than 50% of this time was spent in counseling, explanation of diagnosis, planning of further management, and coordination of care.  I have personally reviewed and interpreted on  08/20/2020 daily labs, I reviewed all nursing notes, pharmacy notes,  vitals, pertinent old records  I have discussed plan of care  as described above with RN , patient  on 08/20/2020  Voice Recognition /Dragon dictation system was used to create this note, attempts have been made to correct errors. Please contact the author with questions and/or clarifications.   FFlorencia Reasons MD PhD FACP [facility]  Available via Epic secure chat 7am-7pm for nonurgent issues Please page for urgent issues To page the attending provider between 7A-7P or the covering provider during after hours 7P-7A, please log into the web site [email or web address] and access using universal [place] password for that web site. If you do not have the password, please call the hospital operator.    08/20/2020, 12:34 PM

## 2020-08-21 LAB — GLUCOSE, CAPILLARY
Glucose-Capillary: 99 mg/dL (ref 70–99)
Glucose-Capillary: 135 mg/dL — ABNORMAL HIGH (ref 70–99)
Glucose-Capillary: 96 mg/dL (ref 70–99)
Glucose-Capillary: 100 mg/dL — ABNORMAL HIGH (ref 70–99)

## 2020-08-21 MED ORDER — NYSTATIN 100000 UNIT/GM EX POWD
Freq: Three times a day (TID) | CUTANEOUS | Status: DC
  Administered 2020-08-22 – 2020-09-08 (×3): 1 via TOPICAL
  Filled 2020-08-21 (×2): qty 15

## 2020-08-21 NOTE — Progress Notes (Signed)
PROGRESS NOTE    [name] Sr.  BDZ:[id number] DOB: 08/16/1951 DOA: 07/13/2020 PCP: [name] Hillock, DO    Chief Complaint  Patient presents with  . increase confusion    Brief Narrative:  69 year old gentleman prior history of atrial fibrillation on anticoagulation, dementia and depression brought to ED for increasing agitation. As per the family ,he has been progressively having worsening dementia since last 2 years. Multiple medications have been attempted in the outpatient setting without any improvement. His initial CT of the head on admission was negative for acute findings.Hospital course remarkable for persistent agitation requiring soft restraints. Wife is in the process of initiating medicaid for him and is looking for long term residence.   Social worker closely following for disposition.  Patient is on wait list at geropsych unit.  Subjective:  No interval changes.  He has been medically stable to discharge ,waiting for [facility] psych placement  Remain confused with intermittent agitation requiring four-point restraint , sitter at bedside   Left axilla candidiasis noticed today   Wife expressed concern on 12/10 that patient is loosing weight and not eating enough,  check weight and do calorie count  Assessment & Plan:   Active Problems:   Major depression, recurrent, chronic (HCC)   Essential hypertension   GAD (generalized anxiety disorder)   Dementia with behavioral disturbance (HCC)   Atrial fibrillation with rapid ventricular response (HCC)   Hypokalemia   AMS (altered mental status)   Acute delirium   Hypothyroidism   Vitamin B 12 deficiency  1. Acute delirium superimposed on progressive chronic dementia Initially precipitated by recent death in the family about 2 years ago. Head CT done ,negative for any acute abnormalities. MRI unable to be done as patient not cooperative. Patient with no focal neurological symptoms.Alcohol level negative on  admission. Ammonia,TSH ,Thiamine,Cortisol levels normal. RPR nonreactive. Patient with low vitamin B12 which is being supplemented. Patient was seen in consultation by psychiatry who initially recommended medical management. Patient started on Seroquel 25 mg nightly which has been increased to 50 mg without any significant improvement. Seroquel was discontinued and patient started on Zyprexa 5 mg at bedtime. Patient was also started on depakote. Patient with agitation and aggressiveness still requiring four-point restraints. Due to ongoing agitation, psychiatry was reconsulted and patient seen by psychiatry who recommended adjustment of medications. Psychiatry recommending inpatient psychiatric admission/neuropsych inpatient admission.Patient currently medically stable for discharge . Social worker closely following for disposition.  Patient is on wait list for [facility] gero psych unit.No bed is available yet.  Per RN "Assessed pt to determine if would be able to trial out of bilateral wrist restraints and place in recliner.  Pt has history of being sexually aggressive towards staff.  Upon assessing pt to remove restraints; talked with pt about release guidelines and patient began talking about having sex with women.  Determined no staff to trial out of restraints at that time"  2. Chronic atrial fibrillation On Lopressor for rate control. Eliquis for anticoagulation.  Outpatient follow-up with cardiology for further discussions on ongoing anticoagulation due to history of progressive worsening dementia.  3. Prolonged QTC Improved. Monitor electrolytes  4. Type 2 diabetes mellitus well controlled Hemoglobin A1c 6.9  as per 06/30/2020. On sliding scale insulin.   5. Hypothyroidism On Synthroid.   6. Hypertension Monitor BP .On  Norvasc, Lopressor.   7. Depression/anxiety Continue current medications. Psychiatry was following. Needs inpatient psych admission.   8.  Vitamin B12 deficiency Continue vitamin B12 supplementation.  9. Vitamin D deficiency Continue Vitamin D supplementation.   10. History of ulcerative colitis Continue Imuran. Needs outpatient follow-up with GI.   11.  Urine retention Needing in and out intermittently .  Continue tamsulosin  Left axilla candidiasis: Topical nystatin     DVT prophylaxis:  apixaban (ELIQUIS) tablet 5 mg   Code Status: DNR Family Communication: wife at bedside on 12/10 Disposition:   Status is: Inpatient  Dispo: The patient is from: Home              Anticipated d/c is to: [facility] psych              Anticipated d/c date is: Medically stable to discharge, awaiting for placement                Consultants:   Psychiatry  Procedures:   None  Antimicrobials:   None     Objective: Vitals:   08/20/20 1500 08/20/20 2002 08/21/20 0411 08/21/20 0507  BP: (!) 84/62 92/76  104/71  Pulse: 76 61  99  Resp: 16 17  16   Temp: 98.6 F (37 C) 97.7 F (36.5 C)  97.9 F (36.6 C)  TempSrc: Oral Oral  Oral  SpO2: 97% 96%  96%  Weight:   97.2 kg   Height:        Intake/Output Summary (Last 24 hours) at 08/21/2020 0821 Last data filed at 08/21/2020 1660 Gross per 24 hour  Intake 826 ml  Output 2700 ml  Net -1874 ml   Filed Weights   07/20/20 0922 08/21/20 0411  Weight: 115.2 kg 97.2 kg    Examination:  General exam: Calm, pleasantly confused, not oriented to time or place, in restraints Respiratory system: Clear to auscultation. Respiratory effort normal. Cardiovascular system: S1 & S2 heard, RRR. No JVD, no murmur, No pedal edema. Gastrointestinal system: Abdomen is nondistended, soft and nontender. Normal bowel sounds heard. Central nervous system: Confused Extremities: In restraints, no edema Skin: No rashes, lesions or ulcers Psychiatry: Confused, currently pleasant and cooperative    Data Reviewed: I have personally reviewed following labs and imaging  studies  CBC: Recent Labs  Lab 08/18/20 0408 08/20/20 0458  WBC 5.1 5.9  NEUTROABS 3.4 4.1  HGB 12.6* 12.8*  HCT 37.9* 38.4*  MCV 101.9* 100.3*  PLT 107* 127*    Basic Metabolic Panel: Recent Labs  Lab 08/18/20 0408 08/20/20 0458  NA 137 139  K 3.4* 3.3*  CL 100 101  CO2 28 28  GLUCOSE 103* 115*  BUN 19 12  CREATININE 0.80 0.92  CALCIUM 8.2* 8.5*  MG 1.8 1.9    GFR: Estimated Creatinine Clearance: 81.8 mL/min (by C-G formula based on SCr of 0.92 mg/dL).  Liver Function Tests: Recent Labs  Lab 08/18/20 0408 08/20/20 0458  AST 29 24  ALT 16 16  ALKPHOS 53 57  BILITOT 1.5* 0.8  PROT 5.4* 5.5*  ALBUMIN 2.7* 2.7*    CBG: Recent Labs  Lab 08/20/20 0801 08/20/20 1155 08/20/20 1619 08/20/20 2121 08/21/20 0745  GLUCAP 110* 129* 108* 162* 99     No results found for this or any previous visit (from the past 240 hour(s)).       Radiology Studies: No results found.      Scheduled Meds: . amLODipine  5 mg Oral Daily  . apixaban  5 mg Oral BID  . atorvastatin  20 mg Oral Daily  . azaTHIOprine  200 mg Oral Daily  . divalproex  500 mg  Oral Daily  . divalproex  750 mg Oral QHS  . DULoxetine  60 mg Oral Daily  . feeding supplement  237 mL Oral TID BM  . insulin aspart  0-9 Units Subcutaneous TID WC  . levothyroxine  25 mcg Oral Q0600  . melatonin  3 mg Oral QHS  . metoprolol tartrate  50 mg Oral BID  . OLANZapine  5 mg Oral BID  . [START ON 08/22/2020] potassium chloride  40 mEq Oral Q M,W,F  . senna-docusate  1 tablet Oral QHS  . tamsulosin  0.4 mg Oral Daily  . vitamin B-12  1,000 mcg Oral Daily  . Vitamin D (Ergocalciferol)  50,000 Units Oral Q7 days   Continuous Infusions:   LOS: 38 days   Time spent: 75mns I have personally reviewed and interpreted on  08/21/2020, I reviewed all nursing notes, vitals, pertinent old records  I have discussed plan of care as described above with RN , patient  on 08/21/2020  Voice Recognition  /Dragon dictation system was used to create this note, attempts have been made to correct errors. Please contact the author with questions and/or clarifications.   FFlorencia Reasons MD PhD FACP [facility]  Available via Epic secure chat 7am-7pm for nonurgent issues Please page for urgent issues To page the attending provider between 7A-7P or the covering provider during after hours 7P-7A, please log into the web site [email or web address] and access using universal [place] password for that web site. If you do not have the password, please call the hospital operator.    08/21/2020, 8:21 AM

## 2020-08-21 NOTE — Plan of Care (Signed)
°  Problem: Clinical Measurements: °Goal: Will remain free from infection °Outcome: Progressing °Goal: Respiratory complications will improve °Outcome: Progressing °  °

## 2020-08-22 LAB — CBC WITH DIFFERENTIAL/PLATELET
Abs Immature Granulocytes: 0.16 10*3/uL — ABNORMAL HIGH (ref 0.00–0.07)
Basophils Absolute: 0 10*3/uL (ref 0.0–0.1)
Basophils Relative: 1 %
Eosinophils Absolute: 0.2 10*3/uL (ref 0.0–0.5)
Eosinophils Relative: 4 %
HCT: 40 % (ref 39.0–52.0)
Hemoglobin: 13.5 g/dL (ref 13.0–17.0)
Immature Granulocytes: 3 %
Lymphocytes Relative: 15 %
Lymphs Abs: 0.7 10*3/uL (ref 0.7–4.0)
MCH: 33.4 pg (ref 26.0–34.0)
MCHC: 33.8 g/dL (ref 30.0–36.0)
MCV: 99 fL (ref 80.0–100.0)
Monocytes Absolute: 0.4 10*3/uL (ref 0.1–1.0)
Monocytes Relative: 8 %
Neutro Abs: 3.4 10*3/uL (ref 1.7–7.7)
Neutrophils Relative %: 69 %
Platelets: 144 10*3/uL — ABNORMAL LOW (ref 150–400)
RBC: 4.04 MIL/uL — ABNORMAL LOW (ref 4.22–5.81)
RDW: 16.4 % — ABNORMAL HIGH (ref 11.5–15.5)
WBC: 4.9 10*3/uL (ref 4.0–10.5)
nRBC: 0 % (ref 0.0–0.2)

## 2020-08-22 LAB — GLUCOSE, CAPILLARY
Glucose-Capillary: 99 mg/dL (ref 70–99)
Glucose-Capillary: 140 mg/dL — ABNORMAL HIGH (ref 70–99)
Glucose-Capillary: 135 mg/dL — ABNORMAL HIGH (ref 70–99)
Glucose-Capillary: 106 mg/dL — ABNORMAL HIGH (ref 70–99)

## 2020-08-22 LAB — MAGNESIUM: Magnesium: 1.9 mg/dL (ref 1.7–2.4)

## 2020-08-22 LAB — BASIC METABOLIC PANEL
Anion gap: 12 (ref 5–15)
BUN: 10 mg/dL (ref 8–23)
CO2: 28 mmol/L (ref 22–32)
Calcium: 8.6 mg/dL — ABNORMAL LOW (ref 8.9–10.3)
Chloride: 100 mmol/L (ref 98–111)
Creatinine, Ser: 0.83 mg/dL (ref 0.61–1.24)
GFR, Estimated: >60 mL/min (ref >60)
Glucose, Bld: 98 mg/dL (ref 70–99)
Potassium: 3.2 mmol/L — ABNORMAL LOW (ref 3.5–5.1)
Sodium: 140 mmol/L (ref 135–145)

## 2020-08-22 MED ORDER — RESOURCE INSTANT PROTEIN PO PWD PACKET
1.0000 | Freq: Three times a day (TID) | ORAL | Status: DC
  Administered 2020-08-22 – 2020-08-30 (×15): 6 g via ORAL
  Filled 2020-08-22 (×11): qty 6

## 2020-08-22 NOTE — Progress Notes (Signed)
PROGRESS NOTE    [name] Sr.  WTU:[id number] DOB: 05/06/1951 DOA: 07/13/2020 PCP: [name], DO   Chief Complain:  Brief Narrative:  69 year old gentleman prior history of atrial fibrillation on anticoagulation, dementia and depression brought to ED for increasing agitation. As per the family ,he has been progressively having worsening dementia since last 2 years. Multiple medications have been attempted in the outpatient setting without any improvement. His initial CT of the head on admission was negative for acute findings.Hospital course remarkable for persistent agitation requiring soft restraints. Wife is in the process of initiating medicaid for him and is looking for long term residence.   Social worker closely following for disposition.  Patient is on wait list at geropsych unit.   Assessment & Plan:   Active Problems:   Major depression, recurrent, chronic (HCC)   Essential hypertension   GAD (generalized anxiety disorder)   Dementia with behavioral disturbance (HCC)   Atrial fibrillation with rapid ventricular response (HCC)   Hypokalemia   AMS (altered mental status)   Acute delirium   Hypothyroidism   Vitamin B 12 deficiency   1. Acute delirium superimposed on progressive chronic dementia Initially precipitated by recent death in the family about 2 years ago.  Head CT done ,negative for any acute abnormalities.  MRI unable to be done as patient not cooperative.  Patient with no focal neurological symptoms. Alcohol level negative on admission.  Ammonia,TSH ,Thiamine, Cortisol levels normal.  RPR nonreactive.  Patient with low vitamin B12 which is being supplemented.  Patient was seen in consultation by psychiatry who initially recommended medical management.  Patient started on Seroquel 25 mg nightly which has been increased to 50 mg without any significant improvement.  Seroquel was discontinued and patient started on Zyprexa 5 mg at bedtime.  Patient was   also started on depakote. Patient with agitation and aggressiveness still requiring four-point restraints. Due to ongoing agitation, psychiatry was reconsulted and patient seen by psychiatry who recommended adjustment of medications. Psychiatry recommending inpatient psychiatric admission/neuropsych inpatient admission. Patient currently medically stable for discharge .  Social worker closely following for disposition.  Patient is on wait list for [facility] gero psych unit.No bed is available yet.  2.  Chronic atrial fibrillation On Lopressor for rate control.  Eliquis for anticoagulation.  Outpatient follow-up with cardiology for further discussions on ongoing anticoagulation due to history of progressive worsening dementia.  3.  Prolonged QTC Improved.  Monitor electrolytes  4.  Type 2 diabetes mellitus well controlled Hemoglobin A1c 6.9  as per 06/30/2020.   On sliding scale insulin.   5.  Hypothyroidism On Synthroid.   6.  Hypertension Monitor BP .On  Norvasc, Lopressor.    7.  Depression/anxiety Continue current medications.  Psychiatry was following.  Needs inpatient psych admission.    8.  Vitamin B12 deficiency Continue vitamin B12 supplementation.   9.  Vitamin D deficiency Continue Vitamin D supplementation.   10.  History of ulcerative colitis Continue Imuran.  Needs outpatient follow-up with GI.   11.  Urine retention Needing in and out intermittently .  Continue tamsulosin  Nutrition Problem: Inadequate oral intake Etiology: poor appetite (AMS)      DVT prophylaxis:Eliquis Code Status: DNR Family Communication: None  at bedside Status is: Inpatient  Remains inpatient appropriate because:Unsafe d/c plan   Dispo:  Patient From:  Home  Planned Disposition:  Inpatient psych  Expected discharge date: As soon bed is available  Medically stable for discharge:  Yes  Consultants: Psychiatry  Procedures: None  Antimicrobials:  Anti-infectives  (From admission, onward)   Start     Dose/Rate Route Frequency Ordered Stop   07/14/20 0445  valACYclovir (VALTREX) tablet 1,000 mg  Status:  Discontinued       Note to Pharmacy: dermatonal shingles   1,000 mg Oral 3 times daily 07/14/20 0357 07/17/20 1007      Subjective:  Patient seen and examined at the bedside this morning.  Hemodynamically stable.  Comfortable.  On soft restraints.  Alert and awake and communicative but not oriented.  Objective: Vitals:   08/21/20 1357 08/21/20 2043 08/22/20 0550 08/22/20 0559  BP: 102/65 (!) 105/54  124/78  Pulse: 80 (!) 52  75  Resp: 20 20  17   Temp: 97.7 F (36.5 C) 98.4 F (36.9 C)  (!) 97.5 F (36.4 C)  TempSrc: Oral     SpO2: 94% 100%  98%  Weight:   97.5 kg   Height:        Intake/Output Summary (Last 24 hours) at 08/22/2020 1034 Last data filed at 08/22/2020 0546 Gross per 24 hour  Intake 480 ml  Output 1600 ml  Net -1120 ml   Filed Weights   07/20/20 0922 08/21/20 0411 08/22/20 0550  Weight: 115.2 kg 97.2 kg 97.5 kg    Examination:  General exam: Appears calm and comfortable ,Not in distress,obese HEENT:PERRL,Oral mucosa moist, Ear/Nose normal on gross exam Respiratory system: Bilateral equal air entry, normal vesicular breath sounds, no wheezes or crackles  Cardiovascular system: S1 & S2 heard, RRR. No JVD, murmurs, rubs, gallops or clicks. Gastrointestinal system: Abdomen is nondistended, soft and nontender. No organomegaly or masses felt. Normal bowel sounds heard. Central nervous system: Alert and oriented. No focal neurological deficits. Extremities: No edema, no clubbing ,no cyanosis, distal peripheral pulses palpable.On soft restrains Skin: No rashes, lesions or ulcers,no icterus ,no pallor   Data Reviewed: I have personally reviewed following labs and imaging studies  CBC: Recent Labs  Lab 08/18/20 0408 08/20/20 0458 08/22/20 0357  WBC 5.1 5.9 4.9  NEUTROABS 3.4 4.1 3.4  HGB 12.6* 12.8* 13.5  HCT  37.9* 38.4* 40.0  MCV 101.9* 100.3* 99.0  PLT 107* 127* 295*   Basic Metabolic Panel: Recent Labs  Lab 08/18/20 0408 08/20/20 0458 08/22/20 0357  NA 137 139 140  K 3.4* 3.3* 3.2*  CL 100 101 100  CO2 28 28 28   GLUCOSE 103* 115* 98  BUN 19 12 10   CREATININE 0.80 0.92 0.83  CALCIUM 8.2* 8.5* 8.6*  MG 1.8 1.9 1.9   GFR: Estimated Creatinine Clearance: 90.9 mL/min (by C-G formula based on SCr of 0.83 mg/dL). Liver Function Tests: Recent Labs  Lab 08/18/20 0408 08/20/20 0458  AST 29 24  ALT 16 16  ALKPHOS 53 57  BILITOT 1.5* 0.8  PROT 5.4* 5.5*  ALBUMIN 2.7* 2.7*   No results for input(s): LIPASE, AMYLASE in the last 168 hours. No results for input(s): AMMONIA in the last 168 hours. Coagulation Profile: No results for input(s): INR, PROTIME in the last 168 hours. Cardiac Enzymes: No results for input(s): CKTOTAL, CKMB, CKMBINDEX, TROPONINI in the last 168 hours. BNP (last 3 results) No results for input(s): PROBNP in the last 8760 hours. HbA1C: No results for input(s): HGBA1C in the last 72 hours. CBG: Recent Labs  Lab 08/21/20 0745 08/21/20 1202 08/21/20 1703 08/21/20 2118 08/22/20 0735  GLUCAP 99 135* 96 100* 99   Lipid Profile: No results for input(s): CHOL, HDL, LDLCALC,  TRIG, CHOLHDL, LDLDIRECT in the last 72 hours. Thyroid Function Tests: No results for input(s): TSH, T4TOTAL, FREET4, T3FREE, THYROIDAB in the last 72 hours. Anemia Panel: No results for input(s): VITAMINB12, FOLATE, FERRITIN, TIBC, IRON, RETICCTPCT in the last 72 hours. Sepsis Labs: No results for input(s): PROCALCITON, LATICACIDVEN in the last 168 hours.  No results found for this or any previous visit (from the past 240 hour(s)).       Radiology Studies: No results found.      Scheduled Meds: . amLODipine  5 mg Oral Daily  . apixaban  5 mg Oral BID  . atorvastatin  20 mg Oral Daily  . azaTHIOprine  200 mg Oral Daily  . divalproex  500 mg Oral Daily  . divalproex  750  mg Oral QHS  . DULoxetine  60 mg Oral Daily  . feeding supplement  237 mL Oral TID BM  . insulin aspart  0-9 Units Subcutaneous TID WC  . levothyroxine  25 mcg Oral Q0600  . melatonin  3 mg Oral QHS  . metoprolol tartrate  50 mg Oral BID  . nystatin   Topical TID  . OLANZapine  5 mg Oral BID  . potassium chloride  40 mEq Oral Q M,W,F  . protein supplement  1 Scoop Oral TID WC  . senna-docusate  1 tablet Oral QHS  . tamsulosin  0.4 mg Oral Daily  . vitamin B-12  1,000 mcg Oral Daily  . Vitamin D (Ergocalciferol)  50,000 Units Oral Q7 days   Continuous Infusions:   LOS: 39 days    Time spent:15 mins, More than 50% of that time was spent in counseling and/or coordination of care.      Shelly Coss, MD [facility] P12/13/2021, 10:34 AM

## 2020-08-22 NOTE — Progress Notes (Signed)
Pt with increased agitation today, he has removed 5 condom catheters, bit a sitter, and kicked another one. MD aware. Will continue to monitor.

## 2020-08-22 NOTE — TOC Progression Note (Signed)
Transition of Care (TOC) - Progression Note    Patient Details  Name: [name] LEFEBER Sr. MRN: [id number] Date of Birth: 01-25-1951  Transition of Care [facility]) CM/SW [place], [facility] Phone Number: 08/22/2020, 11:29 AM  Clinical Narrative:   Mirian Mo with [facility].  No current memory care beds.  Spoke with Melissa at [facility].  She will review his chart for appropriateness of admission to their memory care unit. TOC will continue to follow during the course of hospitalization.     Expected Discharge Plan: Psychiatric Hospital Barriers to Discharge: Psych Bed not available  Expected Discharge Plan and Services Expected Discharge Plan: Psychiatric Hospital In-house Referral: Clinical Social Work Discharge Planning Services: CM Consult Post Acute Care Choice: [place] arrangements for the past 2 months: Single Family Home                                       Social Determinants of Health (SDOH) Interventions    Readmission Risk Interventions Readmission Risk Prevention Plan 02/18/2019  Transportation Screening Complete  PCP or Specialist Appt within 3-5 Days Complete  HRI or [place] Complete  Social Work Consult for [place] Planning/Counseling Complete  Palliative Care Screening Not Applicable  Medication Review Press photographer) Complete  Some recent data might be hidden

## 2020-08-22 NOTE — Progress Notes (Signed)
Nutrition Follow-up  DOCUMENTATION CODES:   Obesity unspecified  INTERVENTION:   -D/c Calorie count -Ensure Plus po BID, each supplement provides 350 kcal and 13 grams of protein -Magic cup TID with meals, each supplement provides 290 kcal and 9 grams of protein -Beneprotein powder with each meal, each provides 25 kcals and 6g protein  NUTRITION DIAGNOSIS:   Inadequate oral intake related to poor appetite (AMS) as evidenced by meal completion < 50%.  Ongoing.  GOAL:   Patient will meet greater than or equal to 90% of their needs  Progressing.  MONITOR:   PO intake,Supplement acceptance,Labs,Weight trends,I & O's  ASSESSMENT:   69 year old gentleman prior history of atrial fibrillation on anticoagulation, dementia and depression brought to ED for increasing agitation.  As per the family ,he has been progressively having worsening dementia since last 2 years. Multiple medications have been attempted in the outpatient setting without any improvement.  His initial CT of the head on admission was negative for acute findings.Hospital course remarkable for persistent agitation requiring soft restraints.  Calorie Count results for 2/10-2/12:  Day 1 D: 380 kcals, 12g protein B: 50 kcals L: 489 kcals, 16g protein Supplements: 875 kcals, 32g protein Total: 1794 kcals, 60g protein (92% of kcal needs, 60% of protein needs)  Day 2 D: refused B: 50 kcals,  L:  5 kcals Supplements: 1050 kcals, 39g protein Total: 1105 kcals, 39g protein (56% of kcal needs, 39% of protein needs)  Patient continues to be alert/oriented x 1. Pt consuming supplements, will continue. Will add Beneprotein to add to things like applesauce and pudding to boost the nutrition.   Admission weight: 253 lbs. Current weight: 214 lbs. Weight loss since admission of 39 lbs. This is a 15% wt loss over 1.5 months, significant for time frame.  Medications: KLOR-CON, Senokot, Vitamin B-12  Labs reviewed: CBGs:  99-100 Low K  Diet Order:   Diet Order            Diet Carb Modified Fluid consistency: Thin; Room service appropriate? Yes  Diet effective now                 EDUCATION NEEDS:   No education needs have been identified at this time  Skin:  Skin Assessment: Reviewed RN Assessment  Last BM:  12/8 -type 2  Height:   Ht Readings from Last 1 Encounters:  07/20/20 5' 7"  (1.702 m)    Weight:   Wt Readings from Last 1 Encounters:  08/22/20 97.5 kg   BMI:  Body mass index is 33.67 kg/m.  Estimated Nutritional Needs:   Kcal:  1950-2150  Protein:  100-110g  Fluid:  2L/day  Clayton Bibles, MS, RD, LDN Inpatient Clinical Dietitian Contact information available via Amion

## 2020-08-22 NOTE — Progress Notes (Signed)
Pt now yelling loudly at sitter. Pt's restraints rechecked and secured due to pt trying to pull arms and legs away from the restraints.

## 2020-08-23 LAB — GLUCOSE, CAPILLARY
Glucose-Capillary: 109 mg/dL — ABNORMAL HIGH (ref 70–99)
Glucose-Capillary: 111 mg/dL — ABNORMAL HIGH (ref 70–99)
Glucose-Capillary: 103 mg/dL — ABNORMAL HIGH (ref 70–99)
Glucose-Capillary: 104 mg/dL — ABNORMAL HIGH (ref 70–99)

## 2020-08-23 NOTE — Progress Notes (Signed)
PROGRESS NOTE    [name] LENN Sr.  WUX:[id number] DOB: [birth year] DOA: 07/13/2020 PCP: [name], DO   Chief Complain:  Brief Narrative:  69 year old gentleman prior history of atrial fibrillation on anticoagulation, dementia and depression brought to ED for increasing agitation. As per the family ,he has been progressively having worsening dementia since last 2 years. Multiple medications have been attempted in the outpatient setting without any improvement. His initial CT of the head on admission was negative for acute findings.Hospital course remarkable for persistent agitation requiring soft restraints. Wife is in the process of initiating medicaid for him and is looking for long term residence.   Social worker closely following for disposition.  Patient is on wait list at geropsych unit.   Assessment & Plan:   Active Problems:   Major depression, recurrent, chronic (HCC)   Essential hypertension   GAD (generalized anxiety disorder)   Dementia with behavioral disturbance (HCC)   Atrial fibrillation with rapid ventricular response (HCC)   Hypokalemia   AMS (altered mental status)   Acute delirium   Hypothyroidism   Vitamin B 12 deficiency   1. Acute delirium superimposed on progressive chronic dementia Initially precipitated by recent death in the family about 2 years ago.  Head CT done ,negative for any acute abnormalities.  MRI unable to be done as patient not cooperative.  Patient with no focal neurological symptoms. Alcohol level negative on admission.  Ammonia,TSH ,Thiamine, Cortisol levels normal.  RPR nonreactive.  Patient with low vitamin B12 which is being supplemented.  Patient was seen in consultation by psychiatry who initially recommended medical management.  Patient started on Seroquel 25 mg nightly which has been increased to 50 mg without any significant improvement.  Seroquel was discontinued and patient started on Zyprexa 5 mg at bedtime.  Patient was   also started on depakote. Patient with agitation and aggressiveness still requiring four-point restraints. Due to ongoing agitation, psychiatry was reconsulted and patient seen by psychiatry who recommended adjustment of medications. Psychiatry recommending inpatient psychiatric admission/neuropsych inpatient admission. Patient currently medically stable for discharge .  Social worker closely following for disposition.  Patient is on wait list for [facility] gero psych unit.No bed is available yet.  2.  Chronic atrial fibrillation On Lopressor for rate control.  Eliquis for anticoagulation.  Outpatient follow-up with cardiology for further discussions on ongoing anticoagulation due to history of progressive worsening dementia.  3.  Prolonged QTC Improved.  Monitor electrolytes  4.  Type 2 diabetes mellitus well controlled Hemoglobin A1c 6.9  as per 06/30/2020.   On sliding scale insulin.   5.  Hypothyroidism On Synthroid.   6.  Hypertension Monitor BP .On  Norvasc, Lopressor.    7.  Depression/anxiety Continue current medications.  Psychiatry was following.  Needs inpatient psych admission.    8.  Vitamin B12 deficiency Continue vitamin B12 supplementation.   9.  Vitamin D deficiency Continue Vitamin D supplementation.   10.  History of ulcerative colitis Continue Imuran.  Needs outpatient follow-up with GI.   11.  Urine retention Needing in and out intermittently .  Continue tamsulosin  Nutrition Problem: Inadequate oral intake Etiology: poor appetite (AMS)      DVT prophylaxis:Eliquis Code Status: DNR Family Communication: None  at bedside Status is: Inpatient  Remains inpatient appropriate because:Unsafe d/c plan   Dispo:  Patient From:  Home  Planned Disposition:  Inpatient psych  Expected discharge date: As soon bed is available  Medically stable for discharge:  Yes  Consultants: Psychiatry  Procedures: None  Antimicrobials:  Anti-infectives  (From admission, onward)   Start     Dose/Rate Route Frequency Ordered Stop   07/14/20 0445  valACYclovir (VALTREX) tablet 1,000 mg  Status:  Discontinued       Note to Pharmacy: dermatonal shingles   1,000 mg Oral 3 times daily 07/14/20 0357 07/17/20 1007      Subjective:  Patient seen and examined the bedside this morning.  No change from yesterday.  On soft restraints, lying in the bed.  Confused  Objective: Vitals:   08/22/20 0559 08/22/20 1422 08/22/20 1948 08/23/20 0535  BP: 124/78 129/88 (!) 111/57 110/76  Pulse: 75 79 71 91  Resp: 17 16 19 17   Temp: (!) 97.5 F (36.4 C) 98.1 F (36.7 C) 98.1 F (36.7 C) 97.6 F (36.4 C)  TempSrc:  Oral    SpO2: 98% 97% 94% 94%  Weight:      Height:        Intake/Output Summary (Last 24 hours) at 08/23/2020 0853 Last data filed at 08/23/2020 0730 Gross per 24 hour  Intake 1310 ml  Output 1125 ml  Net 185 ml   Filed Weights   07/20/20 0922 08/21/20 0411 08/22/20 0550  Weight: 115.2 kg 97.2 kg 97.5 kg    Examination: General exam:,Not in distress,obese Respiratory system: Bilateral equal air entry, normal vesicular breath sounds, no wheezes or crackles  Cardiovascular system: S1 & S2 heard, RRR.  Gastrointestinal system: Abdomen is nondistended, soft and nontender Central nervous system: Alert but not  oriented.  Extremities: No edema, no clubbing ,no cyanosis, soft restraints  Data Reviewed: I have personally reviewed following labs and imaging studies  CBC: Recent Labs  Lab 08/18/20 0408 08/20/20 0458 08/22/20 0357  WBC 5.1 5.9 4.9  NEUTROABS 3.4 4.1 3.4  HGB 12.6* 12.8* 13.5  HCT 37.9* 38.4* 40.0  MCV 101.9* 100.3* 99.0  PLT 107* 127* 503*   Basic Metabolic Panel: Recent Labs  Lab 08/18/20 0408 08/20/20 0458 08/22/20 0357  NA 137 139 140  K 3.4* 3.3* 3.2*  CL 100 101 100  CO2 28 28 28   GLUCOSE 103* 115* 98  BUN 19 12 10   CREATININE 0.80 0.92 0.83  CALCIUM 8.2* 8.5* 8.6*  MG 1.8 1.9 1.9    GFR: Estimated Creatinine Clearance: 90.9 mL/min (by C-G formula based on SCr of 0.83 mg/dL). Liver Function Tests: Recent Labs  Lab 08/18/20 0408 08/20/20 0458  AST 29 24  ALT 16 16  ALKPHOS 53 57  BILITOT 1.5* 0.8  PROT 5.4* 5.5*  ALBUMIN 2.7* 2.7*   No results for input(s): LIPASE, AMYLASE in the last 168 hours. No results for input(s): AMMONIA in the last 168 hours. Coagulation Profile: No results for input(s): INR, PROTIME in the last 168 hours. Cardiac Enzymes: No results for input(s): CKTOTAL, CKMB, CKMBINDEX, TROPONINI in the last 168 hours. BNP (last 3 results) No results for input(s): PROBNP in the last 8760 hours. HbA1C: No results for input(s): HGBA1C in the last 72 hours. CBG: Recent Labs  Lab 08/22/20 0735 08/22/20 1125 08/22/20 1630 08/22/20 1944 08/23/20 0741  GLUCAP 99 140* 135* 106* 109*   Lipid Profile: No results for input(s): CHOL, HDL, LDLCALC, TRIG, CHOLHDL, LDLDIRECT in the last 72 hours. Thyroid Function Tests: No results for input(s): TSH, T4TOTAL, FREET4, T3FREE, THYROIDAB in the last 72 hours. Anemia Panel: No results for input(s): VITAMINB12, FOLATE, FERRITIN, TIBC, IRON, RETICCTPCT in the last 72 hours. Sepsis Labs: No results for  input(s): PROCALCITON, LATICACIDVEN in the last 168 hours.  No results found for this or any previous visit (from the past 240 hour(s)).       Radiology Studies: No results found.      Scheduled Meds: . amLODipine  5 mg Oral Daily  . apixaban  5 mg Oral BID  . atorvastatin  20 mg Oral Daily  . azaTHIOprine  200 mg Oral Daily  . divalproex  500 mg Oral Daily  . divalproex  750 mg Oral QHS  . DULoxetine  60 mg Oral Daily  . feeding supplement  237 mL Oral TID BM  . insulin aspart  0-9 Units Subcutaneous TID WC  . levothyroxine  25 mcg Oral Q0600  . melatonin  3 mg Oral QHS  . metoprolol tartrate  50 mg Oral BID  . nystatin   Topical TID  . OLANZapine  5 mg Oral BID  . potassium chloride   40 mEq Oral Q M,W,F  . protein supplement  1 Scoop Oral TID WC  . senna-docusate  1 tablet Oral QHS  . tamsulosin  0.4 mg Oral Daily  . vitamin B-12  1,000 mcg Oral Daily  . Vitamin D (Ergocalciferol)  50,000 Units Oral Q7 days   Continuous Infusions:   LOS: 40 days    Time spent:15 mins, More than 50% of that time was spent in counseling and/or coordination of care.      Shelly Coss, MD [facility] P12/14/2021, 8:53 AM
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
## 2020-08-23 NOTE — Plan of Care (Signed)
Patient has been restless, alert to self, disoriented to his situation, but slept well since midnight.  Safety for patient and staff has been maintained with the use of soft 4 point restraints with no further skin breakdown.  1:1 sitter maintained.  Rash under left arm treated with nystatin is still reddened. Patient has benefited from the use of an external condom catheter.

## 2020-08-23 NOTE — TOC Progression Note (Signed)
Transition of Care (TOC) - Progression Note    Patient Details  Name: [name] REITTER Sr. MRN: [id number] Date of Birth: [birth year]  Transition of Care [facility]) CM/SW [place], [place] Phone Number: 08/23/2020, 2:40 PM  Clinical Narrative:   Joanna Hews has denied bed offer to patient based on "behavioral issues."  I left a message for [facility] asking her for feedback. TOC will continue to follow during the course of hospitalization.     Expected Discharge Plan: Psychiatric Hospital Barriers to Discharge: Psych Bed not available  Expected Discharge Plan and Services Expected Discharge Plan: Psychiatric Hospital In-house Referral: Clinical Social Work Discharge Planning Services: CM Consult Post Acute Care Choice: [place] arrangements for the past 2 months: Single Family Home                                       Social Determinants of Health (SDOH) Interventions    Readmission Risk Interventions Readmission Risk Prevention Plan 02/18/2019  Transportation Screening Complete  PCP or Specialist Appt within 3-5 Days Complete  HRI or [place] Complete  Social Work Consult for [place] Planning/Counseling Complete  Palliative Care Screening Not Applicable  Medication Review Press photographer) Complete  Some recent data might be hidden

## 2020-08-24 LAB — GLUCOSE, CAPILLARY
Glucose-Capillary: 102 mg/dL — ABNORMAL HIGH (ref 70–99)
Glucose-Capillary: 87 mg/dL (ref 70–99)
Glucose-Capillary: 86 mg/dL (ref 70–99)
Glucose-Capillary: 105 mg/dL — ABNORMAL HIGH (ref 70–99)
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
## 2020-08-24 NOTE — TOC Progression Note (Signed)
Transition of Care (TOC) - Progression Note    Patient Details  Name: [name] MCCAULEY Sr. MRN: [id number] Date of Birth: 11/28/1950  Transition of Care [facility]) CM/SW [place], [place] Phone Number: 08/24/2020, 11:12 AM  Clinical Narrative:  Updated clinicals, IVC paperwork FAXed to [facility]. TOC will continue to follow during the course of hospitalization.      Expected Discharge Plan: Psychiatric Hospital Barriers to Discharge: Psych Bed not available  Expected Discharge Plan and Services Expected Discharge Plan: Psychiatric Hospital In-house Referral: Clinical Social Work Discharge Planning Services: CM Consult Post Acute Care Choice: [place] arrangements for the past 2 months: Single Family Home                                       Social Determinants of Health (SDOH) Interventions    Readmission Risk Interventions Readmission Risk Prevention Plan 02/18/2019  Transportation Screening Complete  PCP or Specialist Appt within 3-5 Days Complete  HRI or [place] Complete  Social Work Consult for [place] Planning/Counseling Complete  Palliative Care Screening Not Applicable  Medication Review Press photographer) Complete  Some recent data might be hidden

## 2020-08-24 NOTE — Progress Notes (Signed)
Patient continues with agitation with difficulty redirecting. Food and beverage have been offered. Patient took medications as prescribed. Patient is not in respiratory distress, breathing is unlabored. Four point restraints in use, skin is intact, no redness noted. Patient denies discomfort or pain at this time. 1:1 sitter at bedside throughout shift. Will continue to monitor patient.

## 2020-08-24 NOTE — Progress Notes (Signed)
Patient was agitated and restless at 8pm. He was also having hallucinations of a baby in his room. Patient was redirected several times without success. Patient took his medications as prescribed and slept shortly afterwards uninterrupted until 6am. Breathing is even and unlabored.Four point restraints in use, skin remains clear and intact. Patient denies pain. 1:1 sitter by bedside throughout this shift.

## 2020-08-25 LAB — GLUCOSE, CAPILLARY
Glucose-Capillary: 102 mg/dL — ABNORMAL HIGH (ref 70–99)
Glucose-Capillary: 148 mg/dL — ABNORMAL HIGH (ref 70–99)
Glucose-Capillary: 109 mg/dL — ABNORMAL HIGH (ref 70–99)
Glucose-Capillary: 132 mg/dL — ABNORMAL HIGH (ref 70–99)

## 2020-08-25 NOTE — Progress Notes (Signed)
[name] NOTE    [name] [name].  WFU:[id number] DOB: 1951-06-30 DOA: 07/13/2020 PCP: [name], DO    Brief Narrative:  69 year old gentleman prior history of atrial fibrillation on anticoagulation, dementia and depression brought to ED for increasing agitation. As per the family ,he has been progressively having worsening dementia since last 2 years. Multiple medications have been attempted in the outpatient setting without any improvement. His initial CT of the head on admission was negative for acute findings.Hospital course remarkable for persistent agitation requiring soft restraints. Wife is in the process of initiating medicaid for him and is looking for long term residence.   Social worker closely following for disposition.  Patient is on wait list at geropsych unit.  Assessment & Plan:   Active Problems:   Major depression, recurrent, chronic (HCC)   Essential hypertension   GAD (generalized anxiety disorder)   Dementia with behavioral disturbance (HCC)   Atrial fibrillation with rapid ventricular response (HCC)   Hypokalemia   AMS (altered mental status)   Acute delirium   Hypothyroidism   Vitamin B 12 deficiency   1. Acute delirium superimposed on progressive chronic dementia Initially precipitated by recent death in the family about 2 years ago. Head CT done ,negative for any acute abnormalities. MRI unable to be done as patient not cooperative. Patient with no focal neurological symptoms.Alcohol level negative on admission. Ammonia,TSH ,Thiamine,Cortisol levels normal. RPR nonreactive. Patient with low vitamin B12 which is being supplemented. Patient was seen in consultation by psychiatry who initially recommended medical management. Patient started on Seroquel 25 mg nightly which has been increased to 50 mg without any significant improvement. Seroquel was discontinued and patient started on Zyprexa 5 mg at bedtime. Patient was also started on  depakote. Patient with agitation and aggressiveness still requiring four-point restraints. Due to ongoing agitation, psychiatry was reconsulted and patient seen by psychiatry who recommended adjustment of medications. Psychiatry recommending inpatient psychiatric admission/neuropsych inpatient admission.Patient currently medically stable for discharge . Social worker closely following for disposition.  Patient is on wait list for [facility] gero psych unit. Patient remains at times difficult to redirect and per nursing staff, can become physically violent without warning Stable thus far  2. Chronic atrial fibrillation On Lopressor for rate control. Eliquis for anticoagulation.  Outpatient follow-up with cardiology for further discussions on ongoing anticoagulation due to history of progressive worsening dementia.  3. Prolonged QTC Improved. Monitor electrolytes  4. Type 2 diabetes mellitus well controlled Hemoglobin A1c 6.9  as per 06/30/2020. On sliding scale insulin.   5. Hypothyroidism On Synthroid.   6. Hypertension Monitor BP .On  Norvasc, Lopressor.   7. Depression/anxiety Continue current medications. Psychiatry was following. Needs inpatient psych admission.   8. Vitamin B12 deficiency Continue vitamin B12 supplementation.   9. Vitamin D deficiency Continue Vitamin D supplementation.   10. History of ulcerative colitis Continue Imuran. Needs outpatient follow-up with GI.   11.  Urine retention Needing in and out intermittently .  Continue tamsulosin  Nutrition Problem: Inadequate oral intake Etiology: poor appetite (AMS)   DVT prophylaxis: Eliquis Code Status: DNR Family Communication: Pt in room, family not at bedside  Status is: Inpatient  Remains inpatient appropriate because:Unsafe d/c plan   Dispo:  Patient From:    Planned Disposition:    Expected discharge date: 08/27/2020  Medically stable for discharge:    Consultants:      Procedures:     Antimicrobials: Anti-infectives (From admission, onward)   Start  Dose/Rate Route Frequency Ordered Stop   07/14/20 0445  valACYclovir (VALTREX) tablet 1,000 mg  Status:  Discontinued       Note to Pharmacy: dermatonal shingles   1,000 mg Oral 3 times daily 07/14/20 0357 07/17/20 1007      Subjective: Unable to assess given mentation  Objective: Vitals:   08/23/20 1945 08/24/20 1320 08/24/20 2111 08/25/20 0521  BP: 101/75 104/64 106/70 102/61  Pulse: (!) 107 (!) 107 81 83  Resp: 15 20 18 19   Temp: 99.6 F (37.6 C) 98.5 F (36.9 C) 98.7 F (37.1 C) 98.4 F (36.9 C)  TempSrc: Oral Oral    SpO2: 94% 98% 97% 98%  Weight:      Height:        Intake/Output Summary (Last 24 hours) at 08/25/2020 1616 Last data filed at 08/25/2020 1444 Gross per 24 hour  Intake 1084 ml  Output 800 ml  Net 284 ml   Filed Weights   07/20/20 0922 08/21/20 0411 08/22/20 0550  Weight: 115.2 kg 97.2 kg 97.5 kg    Examination: General exam: Laying in bed, in nad Respiratory system: Normal respiratory effort, no wheezing  Data Reviewed: I have personally reviewed following labs and imaging studies  CBC: Recent Labs  Lab 08/20/20 0458 08/22/20 0357  WBC 5.9 4.9  NEUTROABS 4.1 3.4  HGB 12.8* 13.5  HCT 38.4* 40.0  MCV 100.3* 99.0  PLT 127* 865*   Basic Metabolic Panel: Recent Labs  Lab 08/20/20 0458 08/22/20 0357  NA 139 140  K 3.3* 3.2*  CL 101 100  CO2 28 28  GLUCOSE 115* 98  BUN 12 10  CREATININE 0.92 0.83  CALCIUM 8.5* 8.6*  MG 1.9 1.9   GFR: Estimated Creatinine Clearance: 90.9 mL/min (by C-G formula based on SCr of 0.83 mg/dL). Liver Function Tests: Recent Labs  Lab 08/20/20 0458  AST 24  ALT 16  ALKPHOS 57  BILITOT 0.8  PROT 5.5*  ALBUMIN 2.7*   No results for input(s): LIPASE, AMYLASE in the last 168 hours. No results for input(s): AMMONIA in the last 168 hours. Coagulation Profile: No results for input(s): INR, PROTIME in  the last 168 hours. Cardiac Enzymes: No results for input(s): CKTOTAL, CKMB, CKMBINDEX, TROPONINI in the last 168 hours. BNP (last 3 results) No results for input(s): PROBNP in the last 8760 hours. HbA1C: No results for input(s): HGBA1C in the last 72 hours. CBG: Recent Labs  Lab 08/24/20 1120 08/24/20 1609 08/24/20 2043 08/25/20 0820 08/25/20 1213  GLUCAP 87 86 105* 102* 148*   Lipid Profile: No results for input(s): CHOL, HDL, LDLCALC, TRIG, CHOLHDL, LDLDIRECT in the last 72 hours. Thyroid Function Tests: No results for input(s): TSH, T4TOTAL, FREET4, T3FREE, THYROIDAB in the last 72 hours. Anemia Panel: No results for input(s): VITAMINB12, FOLATE, FERRITIN, TIBC, IRON, RETICCTPCT in the last 72 hours. Sepsis Labs: No results for input(s): PROCALCITON, LATICACIDVEN in the last 168 hours.  No results found for this or any previous visit (from the past 240 hour(s)).   Radiology Studies: No results found.  Scheduled Meds: . amLODipine  5 mg Oral Daily  . apixaban  5 mg Oral BID  . atorvastatin  20 mg Oral Daily  . azaTHIOprine  200 mg Oral Daily  . divalproex  500 mg Oral Daily  . divalproex  750 mg Oral QHS  . DULoxetine  60 mg Oral Daily  . feeding supplement  237 mL Oral TID BM  . insulin aspart  0-9 Units  Subcutaneous TID WC  . levothyroxine  25 mcg Oral Q0600  . melatonin  3 mg Oral QHS  . metoprolol tartrate  50 mg Oral BID  . nystatin   Topical TID  . OLANZapine  5 mg Oral BID  . potassium chloride  40 mEq Oral Q M,W,F  . protein supplement  1 Scoop Oral TID WC  . senna-docusate  1 tablet Oral QHS  . tamsulosin  0.4 mg Oral Daily  . vitamin B-12  1,000 mcg Oral Daily  . Vitamin D (Ergocalciferol)  50,000 Units Oral Q7 days   Continuous Infusions:   LOS: 42 days   Marylu Lund, MD [facility] Pager On Amion  If 7PM-7AM, please contact night-coverage 08/25/2020, 4:16 PM

## 2020-08-25 NOTE — Progress Notes (Signed)
Pt pulled condom catheter off and twisted himself in the bed and attempted to urinate on the sitter. This nurse attempted to redirect pt but unable to do so.

## 2020-08-26 LAB — GLUCOSE, CAPILLARY
Glucose-Capillary: 103 mg/dL — ABNORMAL HIGH (ref 70–99)
Glucose-Capillary: 121 mg/dL — ABNORMAL HIGH (ref 70–99)
Glucose-Capillary: 109 mg/dL — ABNORMAL HIGH (ref 70–99)
Glucose-Capillary: 95 mg/dL (ref 70–99)

## 2020-08-26 MED ORDER — SODIUM CHLORIDE 0.9 % IV SOLN: INTRAVENOUS | Status: DC

## 2020-08-26 NOTE — Progress Notes (Signed)
PROGRESS NOTE    [name] KAIGLER Sr.  TJQ:[id number] DOB: 25-Mar-1951 DOA: 07/13/2020 PCP: [name], DO    Brief Narrative:  69 year old gentleman prior history of atrial fibrillation on anticoagulation, dementia and depression brought to ED for increasing agitation. As per the family ,he has been progressively having worsening dementia since last 2 years. Multiple medications have been attempted in the outpatient setting without any improvement. His initial CT of the head on admission was negative for acute findings.Hospital course remarkable for persistent agitation requiring soft restraints. Wife is in the process of initiating medicaid for him and is looking for long term residence.   Social worker closely following for disposition.  Patient is on wait list at geropsych unit.  Assessment & Plan:   Active Problems:   Major depression, recurrent, chronic (HCC)   Essential hypertension   GAD (generalized anxiety disorder)   Dementia with behavioral disturbance (HCC)   Atrial fibrillation with rapid ventricular response (HCC)   Hypokalemia   AMS (altered mental status)   Acute delirium   Hypothyroidism   Vitamin B 12 deficiency   1. Acute delirium superimposed on progressive chronic dementia Initially precipitated by recent death in the family about 2 years ago. Head CT done ,negative for any acute abnormalities. MRI unable to be done as patient not cooperative. Patient with no focal neurological symptoms.Alcohol level negative on admission. Ammonia,TSH ,Thiamine,Cortisol levels normal. RPR nonreactive. Patient with low vitamin B12 which is being supplemented. Patient was seen in consultation by psychiatry who initially recommended medical management. Patient started on Seroquel 25 mg nightly which has been increased to 50 mg without any significant improvement. Seroquel was discontinued and patient started on Zyprexa 5 mg at bedtime. Patient was also started on  depakote. Patient with agitation and aggressiveness still requiring four-point restraints. Due to ongoing agitation, psychiatry was reconsulted and patient seen by psychiatry who recommended adjustment of medications. Psychiatry recommending inpatient psychiatric admission/neuropsych inpatient admission.Patient currently medically stable for discharge . Social worker closely following for disposition.  Patient is on wait list for [facility] gero psych unit. Patient remains at times difficult to redirect and per nursing staff, can become physically violent without warning Currently stable  2. Chronic atrial fibrillation On Lopressor for rate control. Eliquis for anticoagulation.  Outpatient follow-up with cardiology for further discussions on ongoing anticoagulation due to history of progressive worsening dementia.  3. Prolonged QTC Improved. Monitor electrolytes  4. Type 2 diabetes mellitus well controlled Hemoglobin A1c 6.9  as per 06/30/2020. On sliding scale insulin.   5. Hypothyroidism On Synthroid.   6. Hypertension Monitor BP .On  Norvasc, Lopressor.  BP stable  7. Depression/anxiety Continue current medications. Psychiatry had been following. Needs inpatient psych admission.   8. Vitamin B12 deficiency Continue vitamin B12 supplementation.   9. Vitamin D deficiency Continue Vitamin D supplementation.   10. History of ulcerative colitis Continue Imuran. Needs outpatient follow-up with GI.   11.  Urine retention Needing in and out intermittently .  Continue tamsulosin  Nutrition Problem: Inadequate oral intake Etiology: poor appetite (AMS)   DVT prophylaxis: Eliquis Code Status: DNR Family Communication: Pt in room, family not at bedside  Status is: Inpatient  Remains inpatient appropriate because:Unsafe d/c plan   Dispo:  Patient From:    Planned Disposition:    Expected discharge date: 08/27/2020  Medically stable for discharge:     Consultants:     Procedures:     Antimicrobials: Anti-infectives (From admission, onward)   Start  Dose/Rate Route Frequency Ordered Stop   07/14/20 0445  valACYclovir (VALTREX) tablet 1,000 mg  Status:  Discontinued       Note to Pharmacy: dermatonal shingles   1,000 mg Oral 3 times daily 07/14/20 0357 07/17/20 1007      Subjective: Confused but without complaints, in good spirits  Objective: Vitals:   08/25/20 1741 08/25/20 2009 08/26/20 0609 08/26/20 1215  BP: (!) 121/55 103/75 122/84 94/62  Pulse: 97 74 92 82  Resp: 17 18 16 17   Temp: 98.7 F (37.1 C) 99.4 F (37.4 C) 98 F (36.7 C) 98 F (36.7 C)  TempSrc:  Oral Oral Oral  SpO2: 95% 95% 92% 97%  Weight:      Height:        Intake/Output Summary (Last 24 hours) at 08/26/2020 1647 Last data filed at 08/26/2020 1237 Gross per 24 hour  Intake 486 ml  Output 210 ml  Net 276 ml   Filed Weights   07/20/20 0922 08/21/20 0411 08/22/20 0550  Weight: 115.2 kg 97.2 kg 97.5 kg    Examination: General exam: Conversant, in no acute distress Respiratory system: normal chest rise, clear, no audible wheezing  Data Reviewed: I have personally reviewed following labs and imaging studies  CBC: Recent Labs  Lab 08/20/20 0458 08/22/20 0357  WBC 5.9 4.9  NEUTROABS 4.1 3.4  HGB 12.8* 13.5  HCT 38.4* 40.0  MCV 100.3* 99.0  PLT 127* 027*   Basic Metabolic Panel: Recent Labs  Lab 08/20/20 0458 08/22/20 0357  NA 139 140  K 3.3* 3.2*  CL 101 100  CO2 28 28  GLUCOSE 115* 98  BUN 12 10  CREATININE 0.92 0.83  CALCIUM 8.5* 8.6*  MG 1.9 1.9   GFR: Estimated Creatinine Clearance: 90.9 mL/min (by C-G formula based on SCr of 0.83 mg/dL). Liver Function Tests: Recent Labs  Lab 08/20/20 0458  AST 24  ALT 16  ALKPHOS 57  BILITOT 0.8  PROT 5.5*  ALBUMIN 2.7*   No results for input(s): LIPASE, AMYLASE in the last 168 hours. No results for input(s): AMMONIA in the last 168 hours. Coagulation  Profile: No results for input(s): INR, PROTIME in the last 168 hours. Cardiac Enzymes: No results for input(s): CKTOTAL, CKMB, CKMBINDEX, TROPONINI in the last 168 hours. BNP (last 3 results) No results for input(s): PROBNP in the last 8760 hours. HbA1C: No results for input(s): HGBA1C in the last 72 hours. CBG: Recent Labs  Lab 08/25/20 1213 08/25/20 1619 08/25/20 2003 08/26/20 0843 08/26/20 1148  GLUCAP 148* 109* 132* 103* 121*   Lipid Profile: No results for input(s): CHOL, HDL, LDLCALC, TRIG, CHOLHDL, LDLDIRECT in the last 72 hours. Thyroid Function Tests: No results for input(s): TSH, T4TOTAL, FREET4, T3FREE, THYROIDAB in the last 72 hours. Anemia Panel: No results for input(s): VITAMINB12, FOLATE, FERRITIN, TIBC, IRON, RETICCTPCT in the last 72 hours. Sepsis Labs: No results for input(s): PROCALCITON, LATICACIDVEN in the last 168 hours.  No results found for this or any previous visit (from the past 240 hour(s)).   Radiology Studies: No results found.  Scheduled Meds: . amLODipine  5 mg Oral Daily  . apixaban  5 mg Oral BID  . atorvastatin  20 mg Oral Daily  . azaTHIOprine  200 mg Oral Daily  . divalproex  500 mg Oral Daily  . divalproex  750 mg Oral QHS  . DULoxetine  60 mg Oral Daily  . feeding supplement  237 mL Oral TID BM  . insulin aspart  0-9 Units Subcutaneous TID WC  . levothyroxine  25 mcg Oral Q0600  . melatonin  3 mg Oral QHS  . metoprolol tartrate  50 mg Oral BID  . nystatin   Topical TID  . OLANZapine  5 mg Oral BID  . potassium chloride  40 mEq Oral Q M,W,F  . protein supplement  1 Scoop Oral TID WC  . senna-docusate  1 tablet Oral QHS  . tamsulosin  0.4 mg Oral Daily  . vitamin B-12  1,000 mcg Oral Daily  . Vitamin D (Ergocalciferol)  50,000 Units Oral Q7 days   Continuous Infusions: . sodium chloride 75 mL/hr at 08/26/20 1210     LOS: 91 days   Marylu Lund, MD [facility] Pager On Amion  If 7PM-7AM, please contact  night-coverage 08/26/2020, 4:47 PM

## 2020-08-27 LAB — GLUCOSE, CAPILLARY
Glucose-Capillary: 90 mg/dL (ref 70–99)
Glucose-Capillary: 97 mg/dL (ref 70–99)
Glucose-Capillary: 96 mg/dL (ref 70–99)
Glucose-Capillary: 90 mg/dL (ref 70–99)

## 2020-08-27 NOTE — Progress Notes (Signed)
PROGRESS NOTE    [name] KOBASHIGAWA Sr.  WHQ:[id number] DOB: 1950/10/31 DOA: 07/13/2020 PCP: [name], DO    Brief Narrative:  69 year old gentleman prior history of atrial fibrillation on anticoagulation, dementia and depression brought to ED for increasing agitation. As per the family ,he has been progressively having worsening dementia since last 2 years. Multiple medications have been attempted in the outpatient setting without any improvement. His initial CT of the head on admission was negative for acute findings.Hospital course remarkable for persistent agitation requiring soft restraints. Wife is in the process of initiating medicaid for him and is looking for long term residence.   Social worker closely following for disposition.  Patient is on wait list at geropsych unit.  Assessment & Plan:   Active Problems:   Major depression, recurrent, chronic (HCC)   Essential hypertension   GAD (generalized anxiety disorder)   Dementia with behavioral disturbance (HCC)   Atrial fibrillation with rapid ventricular response (HCC)   Hypokalemia   AMS (altered mental status)   Acute delirium   Hypothyroidism   Vitamin B 12 deficiency   1. Acute delirium superimposed on progressive chronic dementia Initially precipitated by recent death in the family about 2 years ago. Head CT done ,negative for any acute abnormalities. MRI unable to be done as patient not cooperative. Patient with no focal neurological symptoms.Alcohol level negative on admission. Ammonia,TSH ,Thiamine,Cortisol levels normal. RPR nonreactive. Patient with low vitamin B12 which is being supplemented. Patient was seen in consultation by psychiatry who initially recommended medical management. Patient started on Seroquel 25 mg nightly which has been increased to 50 mg without any significant improvement. Seroquel was discontinued and patient started on Zyprexa 5 mg at bedtime. Patient was also started on  depakote. Patient with agitation and aggressiveness still requiring four-point restraints. Due to ongoing agitation, psychiatry was reconsulted and patient seen by psychiatry who recommended adjustment of medications. Psychiatry recommending inpatient psychiatric admission/neuropsych inpatient admission.Patient currently medically stable for discharge . Social worker closely following for disposition.  Patient is on wait list for [facility] gero psych unit. Patient remains at times difficult to redirect and per nursing staff, can become physically violent without warning Presently stable  2. Chronic atrial fibrillation On Lopressor for rate control. Eliquis for anticoagulation.  Outpatient follow-up with cardiology for further discussions on ongoing anticoagulation due to history of progressive worsening dementia.  3. Prolonged QTC Improved. Monitor electrolytes  4. Type 2 diabetes mellitus well controlled Hemoglobin A1c 6.9  as per 06/30/2020. On sliding scale insulin.   5. Hypothyroidism On Synthroid.   6. Hypertension Monitor BP .On  Norvasc, Lopressor.  BP stable  7. Depression/anxiety Continue current medications. Psychiatry had been following. Needs inpatient psych admission.   8. Vitamin B12 deficiency Continue vitamin B12 supplementation.   9. Vitamin D deficiency Continue Vitamin D supplementation.   10. History of ulcerative colitis Continue Imuran. Needs outpatient follow-up with GI.   11.  Urine retention Needing in and out intermittently .  Continue tamsulosin  Nutrition Problem: Inadequate oral intake Etiology: poor appetite (AMS)   DVT prophylaxis: Eliquis Code Status: DNR Family Communication: Pt in room, family not at bedside  Status is: Inpatient  Remains inpatient appropriate because:Unsafe d/c plan   Dispo:  Patient From:    Planned Disposition:    Expected discharge date: 08/27/2020  Medically stable for discharge:     Consultants:     Procedures:     Antimicrobials: Anti-infectives (From admission, onward)   Start  Dose/Rate Route Frequency Ordered Stop   07/14/20 0445  valACYclovir (VALTREX) tablet 1,000 mg  Status:  Discontinued       Note to Pharmacy: dermatonal shingles   1,000 mg Oral 3 times daily 07/14/20 0357 07/17/20 1007      Subjective: No complaints  Objective: Vitals:   08/26/20 2105 08/26/20 2109 08/27/20 0531 08/27/20 1309  BP: 122/81  110/82 127/69  Pulse: (!) 49 86 83 96  Resp: 20  20 18   Temp: 97.9 F (36.6 C)  97.8 F (36.6 C) 97.9 F (36.6 C)  TempSrc: Oral  Oral Axillary  SpO2: 96%  98% 96%  Weight:      Height:        Intake/Output Summary (Last 24 hours) at 08/27/2020 1610 Last data filed at 08/27/2020 1347 Gross per 24 hour  Intake 1177.35 ml  Output 1550 ml  Net -372.65 ml   Filed Weights   07/20/20 0922 08/21/20 0411 08/22/20 0550  Weight: 115.2 kg 97.2 kg 97.5 kg    Examination: Respiratory system: normal chest rise, clear, no audible wheezing Central nervous system: No seizures, no tremors  Data Reviewed: I have personally reviewed following labs and imaging studies  CBC: Recent Labs  Lab 08/22/20 0357  WBC 4.9  NEUTROABS 3.4  HGB 13.5  HCT 40.0  MCV 99.0  PLT 254*   Basic Metabolic Panel: Recent Labs  Lab 08/22/20 0357  NA 140  K 3.2*  CL 100  CO2 28  GLUCOSE 98  BUN 10  CREATININE 0.83  CALCIUM 8.6*  MG 1.9   GFR: Estimated Creatinine Clearance: 90.9 mL/min (by C-G formula based on SCr of 0.83 mg/dL). Liver Function Tests: No results for input(s): AST, ALT, ALKPHOS, BILITOT, PROT, ALBUMIN in the last 168 hours. No results for input(s): LIPASE, AMYLASE in the last 168 hours. No results for input(s): AMMONIA in the last 168 hours. Coagulation Profile: No results for input(s): INR, PROTIME in the last 168 hours. Cardiac Enzymes: No results for input(s): CKTOTAL, CKMB, CKMBINDEX, TROPONINI in the last 168  hours. BNP (last 3 results) No results for input(s): PROBNP in the last 8760 hours. HbA1C: No results for input(s): HGBA1C in the last 72 hours. CBG: Recent Labs  Lab 08/26/20 1148 08/26/20 1718 08/26/20 2113 08/27/20 0813 08/27/20 1137  GLUCAP 121* 109* 95 90 97   Lipid Profile: No results for input(s): CHOL, HDL, LDLCALC, TRIG, CHOLHDL, LDLDIRECT in the last 72 hours. Thyroid Function Tests: No results for input(s): TSH, T4TOTAL, FREET4, T3FREE, THYROIDAB in the last 72 hours. Anemia Panel: No results for input(s): VITAMINB12, FOLATE, FERRITIN, TIBC, IRON, RETICCTPCT in the last 72 hours. Sepsis Labs: No results for input(s): PROCALCITON, LATICACIDVEN in the last 168 hours.  No results found for this or any previous visit (from the past 240 hour(s)).   Radiology Studies: No results found.  Scheduled Meds: . amLODipine  5 mg Oral Daily  . apixaban  5 mg Oral BID  . atorvastatin  20 mg Oral Daily  . azaTHIOprine  200 mg Oral Daily  . divalproex  500 mg Oral Daily  . divalproex  750 mg Oral QHS  . DULoxetine  60 mg Oral Daily  . feeding supplement  237 mL Oral TID BM  . insulin aspart  0-9 Units Subcutaneous TID WC  . levothyroxine  25 mcg Oral Q0600  . melatonin  3 mg Oral QHS  . metoprolol tartrate  50 mg Oral BID  . nystatin   Topical TID  .  OLANZapine  5 mg Oral BID  . potassium chloride  40 mEq Oral Q M,W,F  . protein supplement  1 Scoop Oral TID WC  . senna-docusate  1 tablet Oral QHS  . tamsulosin  0.4 mg Oral Daily  . vitamin B-12  1,000 mcg Oral Daily  . Vitamin D (Ergocalciferol)  50,000 Units Oral Q7 days   Continuous Infusions: . sodium chloride 75 mL/hr at 08/26/20 1210     LOS: 33 days   Marylu Lund, MD [facility] Pager On Amion  If 7PM-7AM, please contact night-coverage 08/27/2020, 4:10 PM

## 2020-08-28 ENCOUNTER — Other Ambulatory Visit: Payer: Self-pay | Admitting: Family Medicine

## 2020-08-28 LAB — GLUCOSE, CAPILLARY
Glucose-Capillary: 81 mg/dL (ref 70–99)
Glucose-Capillary: 112 mg/dL — ABNORMAL HIGH (ref 70–99)
Glucose-Capillary: 150 mg/dL — ABNORMAL HIGH (ref 70–99)
Glucose-Capillary: 107 mg/dL — ABNORMAL HIGH (ref 70–99)

## 2020-08-28 MED ORDER — ENSURE ENLIVE PO LIQD
237.0000 mL | Freq: Four times a day (QID) | ORAL | Status: DC
  Administered 2020-08-29 – 2020-09-01 (×8): 237 mL via ORAL

## 2020-08-28 NOTE — Progress Notes (Signed)
Writer noted pt to have orders for IV fluids. Patient does not have an IV site. After multiple attempts by IV team, a site could not be established. MD aware. Will get further lab work and d/c order for IV fluids at this time.

## 2020-08-28 NOTE — Progress Notes (Signed)
Follow-up Nutrition Note  DOCUMENTATION CODES:   Obesity unspecified  INTERVENTION:  Provide Ensure Plus po QID, each supplement provides 350 kcal and 13 grams of protein.  Continue Magic cup TID with meals, each supplement provides 290 kcal and 9 grams of protein.  Continue Beneprotein powder with each meal, each provides 25 kcals and 6g protein.  Encourage PO intake.   NUTRITION DIAGNOSIS:   Inadequate oral intake related to poor appetite (AMS) as evidenced by meal completion < 50%; ongoing  GOAL:   Patient will meet greater than or equal to 90% of their needs; progressing  MONITOR:   PO intake,Supplement acceptance,Labs,Weight trends,I & O's  REASON FOR ASSESSMENT:   Consult Calorie Count  ASSESSMENT:   69 year old gentleman prior history of atrial fibrillation on anticoagulation, dementia and depression brought to ED for increasing agitation.  As per the family ,he has been progressively having worsening dementia since last 2 years. Multiple medications have been attempted in the outpatient setting without any improvement.  His initial CT of the head on admission was negative for acute findings.Hospital course remarkable for persistent agitation requiring soft restraints.  Meal completion has been 10-25% recently. Pt currently has Ensure ordered and has been consuming them. RD to increase Ensure to QID to aid in caloric and protein needs. Will additionally continue Beneprotein and Magic cup with meals. Per MD, pt is medically stable for discharge. Awaiting placement of geriatric psych unit. Pt continues on restraints due to agitation and aggressiveness.   Labs and medications reviewed.   Diet Order:   Diet Order            Diet regular Room service appropriate? Yes; Fluid consistency: Thin  Diet effective now                 EDUCATION NEEDS:   No education needs have been identified at this time  Skin:  Skin Assessment: Reviewed RN Assessment  Last BM:   12/17  Height:   Ht Readings from Last 1 Encounters:  07/20/20 5' 7"  (1.702 m)    Weight:   Wt Readings from Last 1 Encounters:  08/22/20 97.5 kg   BMI:  Body mass index is 33.67 kg/m.  Estimated Nutritional Needs:   Kcal:  1950-2150  Protein:  100-110g  Fluid:  2L/day  [name] Parker, MS, RD, LDN RD pager number/after hours weekend pager number on Amion.

## 2020-08-28 NOTE — Progress Notes (Signed)
PROGRESS NOTE    [name] MULA Sr.  IWL:[id number] DOB: [birth year] DOA: 07/13/2020 PCP: [name], DO    Brief Narrative:  69 year old gentleman prior history of atrial fibrillation on anticoagulation, dementia and depression brought to ED for increasing agitation. As per the family ,he has been progressively having worsening dementia since last 2 years. Multiple medications have been attempted in the outpatient setting without any improvement. His initial CT of the head on admission was negative for acute findings.Hospital course remarkable for persistent agitation requiring soft restraints. Wife is in the process of initiating medicaid for him and is looking for long term residence.   Social worker closely following for disposition.  Patient is on wait list at geropsych unit.  Assessment & Plan:   Active Problems:   Major depression, recurrent, chronic (HCC)   Essential hypertension   GAD (generalized anxiety disorder)   Dementia with behavioral disturbance (HCC)   Atrial fibrillation with rapid ventricular response (HCC)   Hypokalemia   AMS (altered mental status)   Acute delirium   Hypothyroidism   Vitamin B 12 deficiency   1. Acute delirium superimposed on progressive chronic dementia Initially precipitated by recent death in the family about 2 years ago. Head CT done ,negative for any acute abnormalities. MRI unable to be done as patient not cooperative. Patient with no focal neurological symptoms.Alcohol level negative on admission. Ammonia,TSH ,Thiamine,Cortisol levels normal. RPR nonreactive. Patient with low vitamin B12 which is being supplemented. Patient was seen in consultation by psychiatry who initially recommended medical management. Patient started on Seroquel 25 mg nightly which has been increased to 50 mg without any significant improvement. Seroquel was discontinued and patient started on Zyprexa 5 mg at bedtime. Patient was also started on  depakote. Patient with agitation and aggressiveness still requiring four-point restraints. Due to ongoing agitation, psychiatry was reconsulted and patient seen by psychiatry who recommended adjustment of medications. Psychiatry recommending inpatient psychiatric admission/neuropsych inpatient admission.Patient currently medically stable for discharge . Social worker closely following for disposition.  Patient is on wait list for [facility] gero psych unit. Patient remains at times difficult to redirect and per nursing staff, can become physically violent without warning Currently stable  2. Chronic atrial fibrillation On Lopressor for rate control. Eliquis for anticoagulation.  Outpatient follow-up with cardiology for further discussions on ongoing anticoagulation due to history of progressive worsening dementia.  3. Prolonged QTC Improved. Monitor electrolytes  4. Type 2 diabetes mellitus well controlled Hemoglobin A1c 6.9  as per 06/30/2020. On sliding scale insulin.   5. Hypothyroidism On Synthroid.   6. Hypertension Monitor BP .On  Norvasc, Lopressor.  BP stable  7. Depression/anxiety Continue current medications. Psychiatry had been following. Needs inpatient psych admission.   8. Vitamin B12 deficiency Continue vitamin B12 supplementation.   9. Vitamin D deficiency Continue Vitamin D supplementation.   10. History of ulcerative colitis Continue Imuran. Needs outpatient follow-up with GI.   11.  Urine retention Needing in and out intermittently .  Continue tamsulosin  Nutrition Problem: Inadequate oral intake Etiology: poor appetite (AMS)   DVT prophylaxis: Eliquis Code Status: DNR Family Communication: Pt in room, family not at bedside  Status is: Inpatient  Remains inpatient appropriate because:Unsafe d/c plan   Dispo:  Patient From:    Planned Disposition:    Expected discharge date: 08/27/2020  Medically stable for discharge:     Consultants:     Procedures:     Antimicrobials: Anti-infectives (From admission, onward)   Start  Dose/Rate Route Frequency Ordered Stop   07/14/20 0445  valACYclovir (VALTREX) tablet 1,000 mg  Status:  Discontinued       Note to Pharmacy: dermatonal shingles   1,000 mg Oral 3 times daily 07/14/20 0357 07/17/20 1007      Subjective: Without complaints  Objective: Vitals:   08/27/20 1819 08/27/20 2022 08/28/20 0447 08/28/20 1404  BP: 103/70 113/68 110/72 (!) 98/58  Pulse: 89 63 70 81  Resp: 18 18 18    Temp: 99 F (37.2 C) 98.5 F (36.9 C) 97.8 F (36.6 C) 97.8 F (36.6 C)  TempSrc: Axillary Axillary Axillary Oral  SpO2: 95% 96% 96% 92%  Weight:      Height:        Intake/Output Summary (Last 24 hours) at 08/28/2020 1651 Last data filed at 08/28/2020 1417 Gross per 24 hour  Intake 1340 ml  Output 525 ml  Net 815 ml   Filed Weights   07/20/20 0922 08/21/20 0411 08/22/20 0550  Weight: 115.2 kg 97.2 kg 97.5 kg    Examination: General exam: Conversant, in no acute distress Respiratory system: normal chest rise, clear, no audible wheezing  Data Reviewed: I have personally reviewed following labs and imaging studies  CBC: Recent Labs  Lab 08/22/20 0357  WBC 4.9  NEUTROABS 3.4  HGB 13.5  HCT 40.0  MCV 99.0  PLT 465*   Basic Metabolic Panel: Recent Labs  Lab 08/22/20 0357  NA 140  K 3.2*  CL 100  CO2 28  GLUCOSE 98  BUN 10  CREATININE 0.83  CALCIUM 8.6*  MG 1.9   GFR: Estimated Creatinine Clearance: 90.9 mL/min (by C-G formula based on SCr of 0.83 mg/dL). Liver Function Tests: No results for input(s): AST, ALT, ALKPHOS, BILITOT, PROT, ALBUMIN in the last 168 hours. No results for input(s): LIPASE, AMYLASE in the last 168 hours. No results for input(s): AMMONIA in the last 168 hours. Coagulation Profile: No results for input(s): INR, PROTIME in the last 168 hours. Cardiac Enzymes: No results for input(s): CKTOTAL, CKMB, CKMBINDEX,  TROPONINI in the last 168 hours. BNP (last 3 results) No results for input(s): PROBNP in the last 8760 hours. HbA1C: No results for input(s): HGBA1C in the last 72 hours. CBG: Recent Labs  Lab 08/27/20 1137 08/27/20 1724 08/27/20 2202 08/28/20 0735 08/28/20 1128  GLUCAP 97 96 90 81 112*   Lipid Profile: No results for input(s): CHOL, HDL, LDLCALC, TRIG, CHOLHDL, LDLDIRECT in the last 72 hours. Thyroid Function Tests: No results for input(s): TSH, T4TOTAL, FREET4, T3FREE, THYROIDAB in the last 72 hours. Anemia Panel: No results for input(s): VITAMINB12, FOLATE, FERRITIN, TIBC, IRON, RETICCTPCT in the last 72 hours. Sepsis Labs: No results for input(s): PROCALCITON, LATICACIDVEN in the last 168 hours.  No results found for this or any previous visit (from the past 240 hour(s)).   Radiology Studies: No results found.  Scheduled Meds: . amLODipine  5 mg Oral Daily  . apixaban  5 mg Oral BID  . atorvastatin  20 mg Oral Daily  . azaTHIOprine  200 mg Oral Daily  . divalproex  500 mg Oral Daily  . divalproex  750 mg Oral QHS  . DULoxetine  60 mg Oral Daily  . feeding supplement  237 mL Oral QID  . insulin aspart  0-9 Units Subcutaneous TID WC  . levothyroxine  25 mcg Oral Q0600  . melatonin  3 mg Oral QHS  . metoprolol tartrate  50 mg Oral BID  . nystatin  Topical TID  . OLANZapine  5 mg Oral BID  . potassium chloride  40 mEq Oral Q M,W,F  . protein supplement  1 Scoop Oral TID WC  . senna-docusate  1 tablet Oral QHS  . tamsulosin  0.4 mg Oral Daily  . vitamin B-12  1,000 mcg Oral Daily  . Vitamin D (Ergocalciferol)  50,000 Units Oral Q7 days   Continuous Infusions:    LOS: 45 days   Marylu Lund, MD [facility] Pager On Amion  If 7PM-7AM, please contact night-coverage 08/28/2020, 4:51 PM

## 2020-08-29 LAB — COMPREHENSIVE METABOLIC PANEL
ALT: 16 U/L (ref 0–44)
AST: 23 U/L (ref 15–41)
Albumin: 2.8 g/dL — ABNORMAL LOW (ref 3.5–5.0)
Alkaline Phosphatase: 51 U/L (ref 38–126)
Anion gap: 10 (ref 5–15)
BUN: 7 mg/dL — ABNORMAL LOW (ref 8–23)
CO2: 29 mmol/L (ref 22–32)
Calcium: 8.6 mg/dL — ABNORMAL LOW (ref 8.9–10.3)
Chloride: 99 mmol/L (ref 98–111)
Creatinine, Ser: 0.88 mg/dL (ref 0.61–1.24)
GFR, Estimated: >60 mL/min (ref >60)
Glucose, Bld: 102 mg/dL — ABNORMAL HIGH (ref 70–99)
Potassium: 4 mmol/L (ref 3.5–5.1)
Sodium: 138 mmol/L (ref 135–145)
Total Bilirubin: 1.1 mg/dL (ref 0.3–1.2)
Total Protein: 5.4 g/dL — ABNORMAL LOW (ref 6.5–8.1)

## 2020-08-29 LAB — GLUCOSE, CAPILLARY
Glucose-Capillary: 99 mg/dL (ref 70–99)
Glucose-Capillary: 113 mg/dL — ABNORMAL HIGH (ref 70–99)
Glucose-Capillary: 116 mg/dL — ABNORMAL HIGH (ref 70–99)
Glucose-Capillary: 116 mg/dL — ABNORMAL HIGH (ref 70–99)

## 2020-08-29 LAB — CBC
HCT: 37.5 % — ABNORMAL LOW (ref 39.0–52.0)
Hemoglobin: 12.9 g/dL — ABNORMAL LOW (ref 13.0–17.0)
MCH: 34.4 pg — ABNORMAL HIGH (ref 26.0–34.0)
MCHC: 34.4 g/dL (ref 30.0–36.0)
MCV: 100 fL (ref 80.0–100.0)
Platelets: 193 10*3/uL (ref 150–400)
RBC: 3.75 MIL/uL — ABNORMAL LOW (ref 4.22–5.81)
RDW: 17.4 % — ABNORMAL HIGH (ref 11.5–15.5)
WBC: 6.5 10*3/uL (ref 4.0–10.5)
nRBC: 0 % (ref 0.0–0.2)

## 2020-08-29 LAB — MAGNESIUM: Magnesium: 1.8 mg/dL (ref 1.7–2.4)

## 2020-08-29 NOTE — Progress Notes (Signed)
[name] Sr.  OLM:[id number] DOB: [birth year] DOA: 07/13/2020 PCP: [name], DO    Brief Narrative:  69 year old gentleman prior history of atrial fibrillation on anticoagulation, dementia and depression brought to ED for increasing agitation. As per the family ,he has been progressively having worsening dementia since last 2 years. Multiple medications have been attempted in the outpatient setting without any improvement. His initial CT of the head on admission was negative for acute findings.Hospital course remarkable for persistent agitation requiring soft restraints. Wife is in the process of initiating medicaid for him and is looking for long term residence.   Social worker closely following for disposition.  Patient is on wait list at geropsych unit.  Assessment & Plan:   Active Problems:   Major depression, recurrent, chronic (HCC)   Essential hypertension   GAD (generalized anxiety disorder)   Dementia with behavioral disturbance (HCC)   Atrial fibrillation with rapid ventricular response (HCC)   Hypokalemia   AMS (altered mental status)   Acute delirium   Hypothyroidism   Vitamin B 12 deficiency   1. Acute delirium superimposed on progressive chronic dementia Initially precipitated by recent death in the family about 2 years ago. Head CT done ,negative for any acute abnormalities. MRI unable to be done as patient not cooperative. Patient with no focal neurological symptoms.Alcohol level negative on admission. Ammonia,TSH ,Thiamine,Cortisol levels normal. RPR nonreactive. Patient with low vitamin B12 which is being supplemented. Patient was seen in consultation by psychiatry who initially recommended medical management. Patient started on Seroquel 25 mg nightly which has been increased to 50 mg without any significant improvement. Seroquel was discontinued and patient started on Zyprexa 5 mg at bedtime. Patient was also started on  depakote. Patient with agitation and aggressiveness still requiring four-point restraints. Due to ongoing agitation, psychiatry was reconsulted and patient seen by psychiatry who recommended adjustment of medications. Psychiatry recommending inpatient psychiatric admission/neuropsych inpatient admission.Patient currently medically stable for discharge . Social worker closely following for disposition.  Patient is on wait list for [facility] gero psych unit. Patient remains at times difficult to redirect and per nursing staff, can become physically violent without warning Presently stable  2. Chronic atrial fibrillation On Lopressor for rate control. Eliquis for anticoagulation.  Outpatient follow-up with cardiology for further discussions on ongoing anticoagulation due to history of progressive worsening dementia.  3. Prolonged QTC Improved. Monitor electrolytes  4. Type 2 diabetes mellitus well controlled Hemoglobin A1c 6.9  as per 06/30/2020. On sliding scale insulin.   5. Hypothyroidism On Synthroid.   6. Hypertension Monitor BP .On  Norvasc, Lopressor.  BP stable  7. Depression/anxiety Continue current medications. Psychiatry had been following. Needs inpatient psych admission.   8. Vitamin B12 deficiency Continue vitamin B12 supplementation.   9. Vitamin D deficiency Continue Vitamin D supplementation.   10. History of ulcerative colitis Continue Imuran. Needs outpatient follow-up with GI.   11.  Urine retention Needing in and out intermittently .  Continue tamsulosin  Nutrition Problem: Inadequate oral intake Etiology: poor appetite (AMS)   DVT prophylaxis: Eliquis Code Status: DNR Family Communication: Pt in room, family not at bedside  Status is: Inpatient  Remains inpatient appropriate because:Unsafe d/c plan   Dispo:  Patient From:    Planned Disposition:    Expected discharge date: 09/01/2020  Medically stable for discharge:     Consultants:     Procedures:     Antimicrobials: Anti-infectives (From admission, onward)   Start  Dose/Rate Route Frequency Ordered Stop   07/14/20 0445  valACYclovir (VALTREX) tablet 1,000 mg  Status:  Discontinued       Note to Pharmacy: dermatonal shingles   1,000 mg Oral 3 times daily 07/14/20 0357 07/17/20 1007      Subjective: No complaints at this time  Objective: Vitals:   08/28/20 0447 08/28/20 1404 08/28/20 1717 08/28/20 1918  BP: 110/72 (!) 98/58 106/66 103/67  Pulse: 70 81 96 92  Resp: 18  16 (!) 21  Temp: 97.8 F (36.6 C) 97.8 F (36.6 C) 98.1 F (36.7 C) 98.7 F (37.1 C)  TempSrc: Axillary Oral Oral Oral  SpO2: 96% 92% 95% 96%  Weight:      Height:        Intake/Output Summary (Last 24 hours) at 08/29/2020 1615 Last data filed at 08/29/2020 1435 Gross per 24 hour  Intake 820 ml  Output 2310 ml  Net -1490 ml   Filed Weights   07/20/20 0922 08/21/20 0411 08/22/20 0550  Weight: 115.2 kg 97.2 kg 97.5 kg    Examination: General exam: Awake, laying in bed, in nad Respiratory system: Normal respiratory effort, no wheezing  Data Reviewed: I have personally reviewed following labs and imaging studies  CBC: Recent Labs  Lab 08/29/20 0328  WBC 6.5  HGB 12.9*  HCT 37.5*  MCV 100.0  PLT 381   Basic Metabolic Panel: Recent Labs  Lab 08/29/20 0328  NA 138  K 4.0  CL 99  CO2 29  GLUCOSE 102*  BUN 7*  CREATININE 0.88  CALCIUM 8.6*  MG 1.8   GFR: Estimated Creatinine Clearance: 85.7 mL/min (by C-G formula based on SCr of 0.88 mg/dL). Liver Function Tests: Recent Labs  Lab 08/29/20 0328  AST 23  ALT 16  ALKPHOS 51  BILITOT 1.1  PROT 5.4*  ALBUMIN 2.8*   No results for input(s): LIPASE, AMYLASE in the last 168 hours. No results for input(s): AMMONIA in the last 168 hours. Coagulation Profile: No results for input(s): INR, PROTIME in the last 168 hours. Cardiac Enzymes: No results for input(s): CKTOTAL, CKMB, CKMBINDEX,  TROPONINI in the last 168 hours. BNP (last 3 results) No results for input(s): PROBNP in the last 8760 hours. HbA1C: No results for input(s): HGBA1C in the last 72 hours. CBG: Recent Labs  Lab 08/28/20 1128 08/28/20 1703 08/28/20 2156 08/29/20 0804 08/29/20 1158  GLUCAP 112* 150* 107* 99 113*   Lipid Profile: No results for input(s): CHOL, HDL, LDLCALC, TRIG, CHOLHDL, LDLDIRECT in the last 72 hours. Thyroid Function Tests: No results for input(s): TSH, T4TOTAL, FREET4, T3FREE, THYROIDAB in the last 72 hours. Anemia Panel: No results for input(s): VITAMINB12, FOLATE, FERRITIN, TIBC, IRON, RETICCTPCT in the last 72 hours. Sepsis Labs: No results for input(s): PROCALCITON, LATICACIDVEN in the last 168 hours.  No results found for this or any previous visit (from the past 240 hour(s)).   Radiology Studies: No results found.  Scheduled Meds: . amLODipine  5 mg Oral Daily  . apixaban  5 mg Oral BID  . atorvastatin  20 mg Oral Daily  . azaTHIOprine  200 mg Oral Daily  . divalproex  500 mg Oral Daily  . divalproex  750 mg Oral QHS  . DULoxetine  60 mg Oral Daily  . feeding supplement  237 mL Oral QID  . insulin aspart  0-9 Units Subcutaneous TID WC  . levothyroxine  25 mcg Oral Q0600  . melatonin  3 mg Oral QHS  . metoprolol  tartrate  50 mg Oral BID  . nystatin   Topical TID  . OLANZapine  5 mg Oral BID  . potassium chloride  40 mEq Oral Q M,W,F  . protein supplement  1 Scoop Oral TID WC  . senna-docusate  1 tablet Oral QHS  . tamsulosin  0.4 mg Oral Daily  . vitamin B-12  1,000 mcg Oral Daily  . Vitamin D (Ergocalciferol)  50,000 Units Oral Q7 days   Continuous Infusions:    LOS: 46 days   Marylu Lund, MD [facility] Pager On Amion  If 7PM-7AM, please contact night-coverage 08/29/2020, 4:15 PM

## 2020-08-29 NOTE — Progress Notes (Signed)
Pt screaming yelling for help attempting to get OOB currently in 4 points

## 2020-08-29 NOTE — Progress Notes (Signed)
   08/29/20 2101  Assess: MEWS Score  Temp 98.2 F (36.8 C)  BP (!) 142/85  Pulse Rate (!) 113  Resp 16  SpO2 95 %  O2 Device Room Air  Assess: MEWS Score  MEWS Temp 0  MEWS Systolic 0  MEWS Pulse 2  MEWS RR 0  MEWS LOC 0  MEWS Score 2  MEWS Score Color Yellow  Assess: if the MEWS score is Yellow or Red  Were vital signs taken at a resting state? Yes  Focused Assessment No change from prior assessment  Early Detection of Sepsis Score *See Row Information* Low  MEWS guidelines implemented *See Row Information* Yes  Treat  MEWS Interventions Administered scheduled meds/treatments;Escalated (See documentation below)  Pain Scale 0-10  Pain Score 0  Take Vital Signs  Increase Vital Sign Frequency  Yellow: Q 2hr X 2 then Q 4hr X 2, if remains yellow, continue Q 4hrs  Escalate  MEWS: Escalate Yellow: discuss with charge nurse/RN and consider discussing with provider and RRT  Notify: Charge Nurse/RN  Name of Charge Nurse/RN Notified Debbie RN  Date Charge Nurse/RN Notified 08/29/20  Time Charge Nurse/RN Notified 2117  Document  Patient Outcome Stabilized after interventions  Progress note created (see row info) Yes

## 2020-08-30 DIAGNOSIS — R451 Restlessness and agitation: Secondary | ICD-10-CM | POA: Diagnosis not present

## 2020-08-30 DIAGNOSIS — I4891 Unspecified atrial fibrillation: Secondary | ICD-10-CM | POA: Diagnosis not present

## 2020-08-30 LAB — URINALYSIS, ROUTINE W REFLEX MICROSCOPIC
Bilirubin Urine: NEGATIVE
Glucose, UA: NEGATIVE mg/dL
Ketones, ur: NEGATIVE mg/dL
Nitrite: NEGATIVE
Protein, ur: NEGATIVE mg/dL
Specific Gravity, Urine: 1.009 (ref 1.005–1.030)
WBC, UA: >50 WBC/hpf — ABNORMAL HIGH (ref 0–5)
pH: 6 (ref 5.0–8.0)

## 2020-08-30 LAB — GLUCOSE, CAPILLARY
Glucose-Capillary: 102 mg/dL — ABNORMAL HIGH (ref 70–99)
Glucose-Capillary: 130 mg/dL — ABNORMAL HIGH (ref 70–99)
Glucose-Capillary: 113 mg/dL — ABNORMAL HIGH (ref 70–99)
Glucose-Capillary: 103 mg/dL — ABNORMAL HIGH (ref 70–99)

## 2020-08-30 NOTE — Progress Notes (Signed)
Patient sat the nurses station today with station with staff.  Patient was pleasantly confused and oriented at times.  Patient fed self and faced timed wife on the phone.  Ambulated to bathroom with 2+ assist; steady on his feet.  Had large BM.  Pt stood on side of bed while tech performed linen change.  Tolerated meds and meals without difficulty.  Talking throughout the day.  Needs to be reoriented at times.  States he was at [facility].  No combativeness or aggressive behavior noted.  Will continue to monitor.

## 2020-08-30 NOTE — Progress Notes (Signed)
Pt status update:  Restraints needed to be placed back on patient  Pt is getting out of bed unassisted.  Pt got out of bed (with safety sitter in room) and was attempting to leave the room.  Per nursing assistant she told pt to wait a few seconds and that's when pt leaned up against the side of wall and slide himself to the floor.  Pt slid to sit on the floor.  Nursing assistant watched pt slide to the floor.  Pt did not hit his head or any other part of his body when sliding to the floor per nursing assistant.      Pt then went out in the hallway and was trying to be redirected back to bed by nursing assistant and staff.  Pt was yelling that "he smelt gas and that someone needed to check there site."  Attempted to reorient pt that he was at the hospital and that there was not a gas leak. Pt is not able to be redirectable.  Pt was then holing on to the side rail saying he felt weak.  Pt did finally walk back into his room with much reluctance.    When attempting to get pt back in bed.  PT made a fist and made gestures at RN like he was going to punch staff.  Second RN was in the room with Probation officer and assisted getting restraints on pt and trying them to bed.

## 2020-08-30 NOTE — Progress Notes (Signed)
PROGRESS NOTE    [name] ALARIE Sr.  DTO:[id number] DOB: 09-17-1950 DOA: 07/13/2020 PCP: [name], DO    Brief Narrative:  69 year old gentleman prior history of atrial fibrillation on anticoagulation, dementia and depression brought to ED for increasing agitation. As per the family ,he has been progressively having worsening dementia since last 2 years. Multiple medications have been attempted in the outpatient setting without any improvement. His initial CT of the head on admission was negative for acute findings.Hospital course remarkable for persistent agitation requiring soft restraints. Wife is in the process of initiating medicaid for him and is looking for long term residence.   Social worker closely following for disposition.  Patient is on wait list at geropsych unit.  Assessment & Plan:   Active Problems:   Major depression, recurrent, chronic (HCC)   Essential hypertension   GAD (generalized anxiety disorder)   Dementia with behavioral disturbance (HCC)   Atrial fibrillation with rapid ventricular response (HCC)   Hypokalemia   AMS (altered mental status)   Acute delirium   Hypothyroidism   Vitamin B 12 deficiency   1. Acute delirium superimposed on progressive chronic dementia Initially precipitated by recent death in the family about 2 years ago. Head CT done ,negative for any acute abnormalities. MRI unable to be done as patient not cooperative. Patient with no focal neurological symptoms.Alcohol level negative on admission. Ammonia,TSH ,Thiamine,Cortisol levels normal. RPR nonreactive. Patient with low vitamin B12 which is being supplemented. Patient was seen in consultation by psychiatry who initially recommended medical management. Patient started on Seroquel 25 mg nightly which has been increased to 50 mg without any significant improvement. Seroquel was discontinued and patient started on Zyprexa 5 mg at bedtime. Patient was also started on  depakote. Patient with agitation and aggressiveness still requiring four-point restraints. Due to ongoing agitation, psychiatry was reconsulted and patient seen by psychiatry who recommended adjustment of medications. Psychiatry recommending inpatient psychiatric admission/neuropsych inpatient admission.Patient currently medically stable for discharge . Social worker closely following for disposition.  Patient is on wait list for [facility] gero psych unit. Patient remains at times difficult to redirect and per nursing staff, can become physically violent without warning Stable at present. Awaiting placement per TOC  2. Chronic atrial fibrillation On Lopressor for rate control. Eliquis for anticoagulation.  Outpatient follow-up with cardiology for further discussions on ongoing anticoagulation due to history of progressive worsening dementia.  3. Prolonged QTC Improved. Monitor electrolytes  4. Type 2 diabetes mellitus well controlled Hemoglobin A1c 6.9  as per 06/30/2020. On sliding scale insulin.   5. Hypothyroidism On Synthroid.   6. Hypertension Monitor BP .On  Norvasc, Lopressor.  BP stable  7. Depression/anxiety Continue current medications. Psychiatry had been following. Needs inpatient psych admission.   8. Vitamin B12 deficiency Continue vitamin B12 supplementation.   9. Vitamin D deficiency Continue Vitamin D supplementation.   10. History of ulcerative colitis Continue Imuran. Needs outpatient follow-up with GI.   11.  Urine retention Needing in and out intermittently .  Continue tamsulosin  Nutrition Problem: Inadequate oral intake Etiology: poor appetite (AMS)   DVT prophylaxis: Eliquis Code Status: DNR Family Communication: Pt in room, family not at bedside  Status is: Inpatient  Remains inpatient appropriate because:Unsafe d/c plan   Dispo:  Patient From:    Planned Disposition:    Expected discharge date:  09/01/2020  Medically stable for discharge:    Consultants:     Procedures:     Antimicrobials: Anti-infectives (From admission,  onward)   Start     Dose/Rate Route Frequency Ordered Stop   07/14/20 0445  valACYclovir (VALTREX) tablet 1,000 mg  Status:  Discontinued       Note to Pharmacy: dermatonal shingles   1,000 mg Oral 3 times daily 07/14/20 0357 07/17/20 1007      Subjective: Without complaints  Objective: Vitals:   08/30/20 0504 08/30/20 0504 08/30/20 0807 08/30/20 1356  BP: 92/66 92/66 (!) 103/58 121/74  Pulse: 80 68 91 (!) 105  Resp: 20 20 20 20   Temp: 97.7 F (36.5 C) 97.7 F (36.5 C) 98.5 F (36.9 C) 97.8 F (36.6 C)  TempSrc:   Oral Oral  SpO2: 94% 93% 99% 96%  Weight:      Height:        Intake/Output Summary (Last 24 hours) at 08/30/2020 1601 Last data filed at 08/30/2020 1531 Gross per 24 hour  Intake 480 ml  Output 1925 ml  Net -1445 ml   Filed Weights   07/20/20 0922 08/21/20 0411 08/22/20 0550  Weight: 115.2 kg 97.2 kg 97.5 kg    Examination: General exam: Laying in bed, in no acute distress Respiratory system: normal chest rise, clear, no audible wheezing  Data Reviewed: I have personally reviewed following labs and imaging studies  CBC: Recent Labs  Lab 08/29/20 0328  WBC 6.5  HGB 12.9*  HCT 37.5*  MCV 100.0  PLT 782   Basic Metabolic Panel: Recent Labs  Lab 08/29/20 0328  NA 138  K 4.0  CL 99  CO2 29  GLUCOSE 102*  BUN 7*  CREATININE 0.88  CALCIUM 8.6*  MG 1.8   GFR: Estimated Creatinine Clearance: 85.7 mL/min (by C-G formula based on SCr of 0.88 mg/dL). Liver Function Tests: Recent Labs  Lab 08/29/20 0328  AST 23  ALT 16  ALKPHOS 51  BILITOT 1.1  PROT 5.4*  ALBUMIN 2.8*   No results for input(s): LIPASE, AMYLASE in the last 168 hours. No results for input(s): AMMONIA in the last 168 hours. Coagulation Profile: No results for input(s): INR, PROTIME in the last 168 hours. Cardiac Enzymes: No  results for input(s): CKTOTAL, CKMB, CKMBINDEX, TROPONINI in the last 168 hours. BNP (last 3 results) No results for input(s): PROBNP in the last 8760 hours. HbA1C: No results for input(s): HGBA1C in the last 72 hours. CBG: Recent Labs  Lab 08/29/20 0804 08/29/20 1158 08/29/20 1734 08/29/20 2103 08/30/20 0803  GLUCAP 99 113* 116* 116* 102*   Lipid Profile: No results for input(s): CHOL, HDL, LDLCALC, TRIG, CHOLHDL, LDLDIRECT in the last 72 hours. Thyroid Function Tests: No results for input(s): TSH, T4TOTAL, FREET4, T3FREE, THYROIDAB in the last 72 hours. Anemia Panel: No results for input(s): VITAMINB12, FOLATE, FERRITIN, TIBC, IRON, RETICCTPCT in the last 72 hours. Sepsis Labs: No results for input(s): PROCALCITON, LATICACIDVEN in the last 168 hours.  No results found for this or any previous visit (from the past 240 hour(s)).   Radiology Studies: No results found.  Scheduled Meds: . amLODipine  5 mg Oral Daily  . apixaban  5 mg Oral BID  . atorvastatin  20 mg Oral Daily  . azaTHIOprine  200 mg Oral Daily  . divalproex  500 mg Oral Daily  . divalproex  750 mg Oral QHS  . DULoxetine  60 mg Oral Daily  . feeding supplement  237 mL Oral QID  . insulin aspart  0-9 Units Subcutaneous TID WC  . levothyroxine  25 mcg Oral Q0600  . melatonin  3 mg Oral QHS  . metoprolol tartrate  50 mg Oral BID  . nystatin   Topical TID  . OLANZapine  5 mg Oral BID  . potassium chloride  40 mEq Oral Q M,W,F  . protein supplement  1 Scoop Oral TID WC  . senna-docusate  1 tablet Oral QHS  . tamsulosin  0.4 mg Oral Daily  . vitamin B-12  1,000 mcg Oral Daily  . Vitamin D (Ergocalciferol)  50,000 Units Oral Q7 days   Continuous Infusions:    LOS: 47 days   Marylu Lund, MD [facility] Pager On Amion  If 7PM-7AM, please contact night-coverage 08/30/2020, 4:01 PM

## 2020-08-31 DIAGNOSIS — R410 Disorientation, unspecified: Secondary | ICD-10-CM | POA: Diagnosis not present

## 2020-08-31 LAB — COMPREHENSIVE METABOLIC PANEL
ALT: 12 U/L (ref 0–44)
AST: 22 U/L (ref 15–41)
Albumin: 2.9 g/dL — ABNORMAL LOW (ref 3.5–5.0)
Alkaline Phosphatase: 53 U/L (ref 38–126)
Anion gap: 10 (ref 5–15)
BUN: 7 mg/dL — ABNORMAL LOW (ref 8–23)
CO2: 28 mmol/L (ref 22–32)
Calcium: 8.9 mg/dL (ref 8.9–10.3)
Chloride: 102 mmol/L (ref 98–111)
Creatinine, Ser: 0.98 mg/dL (ref 0.61–1.24)
GFR, Estimated: >60 mL/min (ref >60)
Glucose, Bld: 100 mg/dL — ABNORMAL HIGH (ref 70–99)
Potassium: 4.5 mmol/L (ref 3.5–5.1)
Sodium: 140 mmol/L (ref 135–145)
Total Bilirubin: 1 mg/dL (ref 0.3–1.2)
Total Protein: 5.4 g/dL — ABNORMAL LOW (ref 6.5–8.1)

## 2020-08-31 LAB — GLUCOSE, CAPILLARY
Glucose-Capillary: 84 mg/dL (ref 70–99)
Glucose-Capillary: 95 mg/dL (ref 70–99)
Glucose-Capillary: 115 mg/dL — ABNORMAL HIGH (ref 70–99)
Glucose-Capillary: 140 mg/dL — ABNORMAL HIGH (ref 70–99)

## 2020-08-31 LAB — VALPROIC ACID LEVEL: Valproic Acid Lvl: 30 ug/mL — ABNORMAL LOW (ref 50.0–100.0)

## 2020-08-31 MED ORDER — DIVALPROEX SODIUM 125 MG PO CSDR
750.0000 mg | DELAYED_RELEASE_CAPSULE | Freq: Two times a day (BID) | ORAL | Status: DC
  Administered 2020-08-31 – 2020-09-09 (×18): 750 mg via ORAL
  Filled 2020-08-31 (×19): qty 6

## 2020-08-31 MED ORDER — CLONAZEPAM 0.5 MG PO TABS
0.5000 mg | ORAL_TABLET | Freq: Two times a day (BID) | ORAL | Status: DC | PRN
  Filled 2020-08-31: qty 1

## 2020-08-31 MED ORDER — HALOPERIDOL LACTATE 5 MG/ML IJ SOLN
5.0000 mg | Freq: Once | INTRAMUSCULAR | Status: AC
  Administered 2020-08-31: 5 mg via INTRAMUSCULAR
  Filled 2020-08-31: qty 1

## 2020-08-31 MED ORDER — CLONAZEPAM 0.5 MG PO TABS
0.5000 mg | ORAL_TABLET | Freq: Every evening | ORAL | Status: DC
  Administered 2020-08-31: 0.5 mg via ORAL

## 2020-08-31 MED ORDER — HALOPERIDOL LACTATE 5 MG/ML IJ SOLN
5.0000 mg | Freq: Four times a day (QID) | INTRAMUSCULAR | Status: DC | PRN
  Administered 2020-09-01 – 2020-09-03 (×3): 5 mg via INTRAMUSCULAR
  Filled 2020-08-31 (×3): qty 1

## 2020-08-31 MED ORDER — HALOPERIDOL LACTATE 5 MG/ML IJ SOLN: 5.0000 mg | Freq: Once | INTRAMUSCULAR | Status: DC

## 2020-08-31 MED ORDER — HALOPERIDOL LACTATE 5 MG/ML IJ SOLN: 2.0000 mg | Freq: Four times a day (QID) | INTRAMUSCULAR | Status: DC | PRN

## 2020-08-31 NOTE — Progress Notes (Signed)
Occupational Therapy Evaluation  Patient with hx of dementia, from home with spouse and per chart was independent with self care. Patient oriented to place, month, year and having appropriate conversation. Does demonstrate some distractibility by environmental stimuli and fixating on parts on walker but easily redirected. Patient min A with functional ambulation using walker with chair follow due to mild impulsivity and pt report of R knee pain. Will trial acute OT to assess patient carry over and progress with safety and activity tolerance in order to reduce caregiver burden however due to patient's diagnosis of dementia will require 24/7 supervision/care at D/C.     08/31/20 1200  OT Visit Information  Last OT Received On 08/31/20  Assistance Needed +2  PT/OT/SLP Co-Evaluation/Treatment Yes  Reason for Co-Treatment For patient/therapist safety;Necessary to address cognition/behavior during functional activity  OT goals addressed during session ADL's and self-care  History of Present Illness 69 year old gentleman prior history of atrial fibrillation on anticoagulation, dementia and depression, admitted for worsening AMS.  Precautions  Precautions Fall  Restrictions  Weight Bearing Restrictions No  Home Living  Family/patient expects to be discharged to: Private residence  Living Arrangements Spouse/significant other  Additional Comments patient unable to provide details of home situation  Prior Function  Level of Independence Independent  Communication  Communication No difficulties  Pain Assessment  Pain Assessment Faces  Faces Pain Scale 2  Pain Location right knee  Pain Descriptors / Indicators Aching  Pain Intervention(s) Monitored during session  Cognition  Arousal/Alertness Awake/alert  Behavior During Therapy [facility] for tasks assessed/performed  Overall Cognitive Status No family/caregiver present to determine baseline cognitive functioning  General Comments patient oriented  to hospital, month, year and that it's near Christmas. patient does demonstrate some distractibility and fixating on walker but was easily redirected. patient conversing appropriately  Upper Extremity Assessment  Upper Extremity Assessment Overall WFL for tasks assessed  Lower Extremity Assessment  Lower Extremity Assessment Defer to PT evaluation  ADL  Overall ADL's  Needs assistance/impaired  Eating/Feeding Set up;Sitting  Eating/Feeding Details (indicate cue type and reason) patient able to drink from cup with straw  Grooming Set up;Sitting  Upper Body Bathing Supervision/ safety;Set up;Sitting  Lower Body Bathing Sitting/lateral leans;Sit to/from stand;Minimal assistance  Upper Body Dressing  Set up;Supervision/safety;Sitting  Lower Body Dressing Minimal assistance;Sitting/lateral leans;Sit to/from Retail buyer Minimal assistance;+2 for safety/equipment;Cueing for safety;Ambulation;RW  Toilet Transfer Details (indicate cue type and reason) patient ambulate in hallway and transfer into recliner chair, requires cues for safe hand placement during sit <> stand and moderate distractibility from environmental stimuli and fixating on parts of walker  Toileting- Clothing Manipulation and Hygiene Minimal assistance;Sit to/from stand;Sitting/lateral lean  Functional mobility during ADLs Minimal assistance;Rolling walker;Cueing for safety  General ADL Comments unsure of patient's baseline, patient exhibiting decreased activity tolerance, safety and pain in knee  Bed Mobility  Overal bed mobility Needs Assistance  Bed Mobility Supine to Sit  Supine to sit Supervision  General bed mobility comments S for safety  Transfers  Overall transfer level Needs assistance  Equipment used Rolling walker (2 wheeled)  Transfers Sit to/from Stand  Sit to Stand Min assist  General transfer comment cues for hand placement  Balance  Overall balance assessment Needs assistance;History of Falls   Sitting-balance support Feet supported;No upper extremity supported  Sitting balance-Leahy Scale Good  Standing balance support During functional activity;Bilateral upper extremity supported  Standing balance-Leahy Scale Fair  Standing balance comment static stand without UE support  OT -  End of Session  Equipment Utilized During Treatment Gait belt;Rolling walker  Activity Tolerance Patient tolerated treatment well  Patient left in chair;with call bell/phone within reach;with nursing/sitter in room  Nurse Communication Mobility status  OT Assessment  OT Recommendation/Assessment Patient needs continued OT Services  OT Visit Diagnosis Other abnormalities of gait and mobility (R26.89);Pain  Pain - Right/Left Right  Pain - part of body Knee  OT Problem List Decreased activity tolerance;Impaired balance (sitting and/or standing);Decreased safety awareness;Pain  OT Plan  OT Frequency (ACUTE ONLY) Min 2X/week  OT Treatment/Interventions (ACUTE ONLY) Self-care/ADL training;Therapeutic activities;Patient/family education;Balance training;Therapeutic exercise;DME and/or AE instruction;Cognitive remediation/compensation  AM-PAC OT "6 Clicks" Daily Activity Outcome Measure (Version 2)  Help from another person eating meals? 3  Help from another person taking care of personal grooming? 3  Help from another person toileting, which includes using toliet, bedpan, or urinal? 3  Help from another person bathing (including washing, rinsing, drying)? 3  Help from another person to put on and taking off regular upper body clothing? 3  Help from another person to put on and taking off regular lower body clothing? 3  6 Click Score 18  OT Recommendation  Follow Up Recommendations SNF  OT Equipment None recommended by OT  Individuals Consulted  Consulted and Agree with Results and Recommendations Patient  Acute Rehab OT Goals  Patient Stated Goal i want to walk more  OT Goal Formulation With patient   Time For Goal Achievement 09/14/20  Potential to Achieve Goals Fair  OT Time Calculation  OT Start Time (ACUTE ONLY) 0801  OT Stop Time (ACUTE ONLY) 0825  OT Time Calculation (min) 24 min  OT General Charges  $OT Visit 1 Visit  OT Evaluation  $OT Eval Low Complexity 1 Low  Written Expression  Dominant Hand  (did not specify)   Delbert Phenix OT OT pager: [phone]

## 2020-08-31 NOTE — TOC Progression Note (Addendum)
Transition of Care (TOC) - Progression Note    Patient Details  Name: PHIL MICHELS Sr. MRN: [id number] Date of Birth: Apr 05, 1951  Transition of Care [facility]) CM/SW [place], [place] Phone Number: 08/31/2020, 9:51 AM  Clinical Narrative:   Yesterday patient's IVC was again updated.  Due again on Monday, December 27.  Information sent to both [facility] and [facility] psychiatric hospitals.  Both have denied admission based on "acuity of behavior." TOC will continue to follow during the course of hospitalization.  Addendum:  Updated IVC paperwork, clinical notes FAXed to [facility].     Expected Discharge Plan: Psychiatric Hospital Barriers to Discharge: Psych Bed not available  Expected Discharge Plan and Services Expected Discharge Plan: Psychiatric Hospital In-house Referral: Clinical Social Work Discharge Planning Services: CM Consult Post Acute Care Choice: [place] arrangements for the past 2 months: Single Family Home                                       Social Determinants of Health (SDOH) Interventions    Readmission Risk Interventions Readmission Risk Prevention Plan 02/18/2019  Transportation Screening Complete  PCP or Specialist Appt within 3-5 Days Complete  HRI or [place] Complete  Social Work Consult for [place] Planning/Counseling Complete  Palliative Care Screening Not Applicable  Medication Review Press photographer) Complete  Some recent data might be hidden

## 2020-08-31 NOTE — Evaluation (Signed)
Physical Therapy Evaluation Patient Details Name: [name]. MRN: [id number] DOB: Dec 05, 1950 Today's Date: 08/31/2020   History of Present Illness  69 year old gentleman prior history of atrial fibrillation on anticoagulation, dementia and depression, admitted for worsening AMS.  Clinical Impression  The patient is calm today. Patient able to participate in mobility. Patient indicating that his right knee was sore. Patient ambulated with RW and min guard x 50' x 2. Patient left in recliner with CNA present.    Follow Up Recommendations SNF    Equipment Recommendations  None recommended by PT    Recommendations for Other Services       Precautions / Restrictions Precautions Precautions: Fall      Mobility  Bed Mobility Overal bed mobility: Needs Assistance Bed Mobility: Supine to Sit     Supine to sit: Supervision          Transfers Overall transfer level: Needs assistance Equipment used: Rolling walker (2 wheeled) Transfers: Sit to/from Stand Sit to Stand: Min guard         General transfer comment: cues for hand placement  Ambulation/Gait Ambulation/Gait assistance: Min guard;+2 safety/equipment Gait Distance (Feet): 50 Feet (x 2) Assistive device: Rolling walker (2 wheeled) Gait Pattern/deviations: Step-through pattern Gait velocity: decr   General Gait Details: patient indicating right knee is painful and " May buckle".  Stairs            Wheelchair Mobility    Modified Rankin (Stroke Patients Only)       Balance Overall balance assessment: Needs assistance;History of Falls Sitting-balance support: Feet supported;No upper extremity supported Sitting balance-Leahy Scale: Good     Standing balance support: During functional activity;Bilateral upper extremity supported Standing balance-Leahy Scale: Fair                               Pertinent Vitals/Pain Pain Assessment: Faces Faces Pain Scale: Hurts a little  bit Pain Location: right knee Pain Descriptors / Indicators: Aching Pain Intervention(s): Monitored during session    Home Living Family/patient expects to be discharged to:: Private residence Living Arrangements: Spouse/significant other               Additional Comments: patient unable to provide details of home situation    Prior Function Level of Independence: Independent               Hand Dominance        Extremity/Trunk Assessment        Lower Extremity Assessment Lower Extremity Assessment: Generalized weakness    Cervical / Trunk Assessment Cervical / Trunk Assessment: Normal  Communication   Communication: No difficulties  Cognition Arousal/Alertness: Awake/alert Behavior During Therapy: WFL for tasks assessed/performed Overall Cognitive Status: No family/caregiver present to determine baseline cognitive functioning Area of Impairment: Safety/judgement;Awareness;Attention                         Safety/Judgement: Decreased awareness of safety Awareness: Emergent   General Comments: oriented to hospital, December, and near Christmas, noted to try to manipulate RW hardware. Stated where he worked and  what he did(if it's reliable).      General Comments      Exercises     Assessment/Plan    PT Assessment Patient needs continued PT services  PT Problem List Decreased strength;Decreased mobility;Decreased safety awareness;Decreased knowledge of precautions;Decreased activity tolerance;Decreased cognition;Decreased balance;Decreased knowledge of use of DME  PT Treatment Interventions DME instruction;Gait training;Functional mobility training;Therapeutic exercise;Therapeutic activities;Cognitive remediation    PT Goals (Current goals can be found in the Care Plan section)  Acute Rehab PT Goals Patient Stated Goal: i want to walk more PT Goal Formulation: Patient unable to participate in goal setting Time For Goal  Achievement: 09/14/20 Potential to Achieve Goals: Good    Frequency Min 2X/week   Barriers to discharge Decreased caregiver support      Co-evaluation PT/OT/SLP Co-Evaluation/Treatment: Yes Reason for Co-Treatment: For patient/therapist safety;Necessary to address cognition/behavior during functional activity;To address functional/ADL transfers PT goals addressed during session: Mobility/safety with mobility         AM-PAC PT "6 Clicks" Mobility  Outcome Measure Help needed turning from your back to your side while in a flat bed without using bedrails?: None Help needed moving from lying on your back to sitting on the side of a flat bed without using bedrails?: None Help needed moving to and from a bed to a chair (including a wheelchair)?: A Little Help needed standing up from a chair using your arms (e.g., wheelchair or bedside chair)?: A Little Help needed to walk in hospital room?: A Little Help needed climbing 3-5 steps with a railing? : A Lot 6 Click Score: 19    End of Session Equipment Utilized During Treatment: Gait belt Activity Tolerance: Patient tolerated treatment well Patient left: in chair;with call bell/phone within reach;with nursing/sitter in room Nurse Communication: Mobility status PT Visit Diagnosis: Unsteadiness on feet (R26.81);Difficulty in walking, not elsewhere classified (R26.2)    Time: 8325-4982 PT Time Calculation (min) (ACUTE ONLY): 24 min   Charges:   PT Evaluation $PT Eval Low Complexity: [place] PT Acute Rehabilitation Services Pager [phone] Office [phone]   Mickey, Esguerra 08/31/2020, 9:30 AM

## 2020-08-31 NOTE — Progress Notes (Addendum)
   08/30/20 1950  What Happened  Was fall witnessed? Yes  Who witnessed fall? Mariama NA  Patients activity before fall ambulating-assisted  Point of contact buttocks  Was patient injured? No  Follow Up  MD notified Sharlet Salina NP  Time MD notified [place] notified No - patient refusal  Time family notified  (will notify wife in AM)  Progress note created (see row info) Yes  Vitals  Temp 98.4 F (36.9 C)  Temp Source Oral  BP 128/64  MAP (mmHg) 84  BP Location Left Arm  BP Method Automatic  Patient Position (if appropriate) Sitting  Pulse Rate (!) 103  Pulse Rate Source Monitor  Resp 20  Oxygen Therapy  SpO2 98 %  O2 Device Room Air    Attempted to update wife.  Called at 12/22 [phone] and pt's wife didn't answer.    Pts wife called back at 63.  RN spoke to wife about pt sliding himself to floor last night.

## 2020-08-31 NOTE — Progress Notes (Signed)
[facility] Progress Note  Patient: [name]    SWN:[id number]  DOA: 07/13/2020     Date of Service: the patient was seen and examined on 08/31/2020  Brief hospital course: Past medical history of A. fib, dementia, depression, anxiety, hypothyroidism. 07/13/2020 presented with confusion and agitation. 11/8//2021 benzodiazepine discontinued. 11/24 2021 Tegretol was started. Psychiatry currently recommends inpatient Geri psych placement given his ongoing confusion and agitation. Currently IVC on 12/21 2021.  Currently plan is treat agitation.  Assessment and Plan: 1. Acute delirium superimposed on progressive chronic dementia Initially precipitated by recent death in the family about 2 years ago. Head CT done ,negative for any acute abnormalities.  Unable to do MRI due to lack of cooperation. no focal neurological symptoms, therefore will not change management. Metabolic work-up including alcohol, ammonia, TSH, thiamine, cortisol, RPR unremarkable. B12 was generally lower, replaced. Psychiatry has been consulted. Medical management initiated. Benzodiazepines were discontinued. Seroquel was initiated but this was discontinued due to concern for QT prolongation. Patient was also started on Tegretol which was discontinued and switched to Depakote. Depakote was increased and Zyprexa was added instead of Seroquel. Patient continues to have sundowning. Add Haldol. As needed restraints. Psychiatry recommending inpatient psychiatric admission inpatient admission. Patient currently medically stable for discharge . Social worker closely following for disposition. Patient is on wait list for [facility] gero psych unit. Patient remains at times difficult to redirect and per nursing staff, can become physically violent without warning Stable at present. Awaiting placement per TOC.  2. Chronic atrial fibrillation On Lopressor for rate control. Eliquis for anticoagulation.   Outpatient follow-up with cardiology for further discussions on ongoing anticoagulation due to history of progressive worsening dementia.  3. Prolonged QTC-resolved Last QT on 12/22 466, improved. Monitor electrolytes  4. Type 2 diabetes mellitus well controlled Hemoglobin A1c 6.9 as per 06/30/2020. On sliding scale insulin.   5. Hypothyroidism On Synthroid.   6. Hypertension Monitor BP .On Norvasc, Lopressor.  BP stable  7. Depression/anxiety Continue current medications. Psychiatry had been following. On mood stabilizer. Check Depakote level and LFT as well as ammonia level. Needs inpatient psych admission.   8. Vitamin B12 deficiency Continue vitamin B12 supplementation.   9. Vitamin D deficiency Continue Vitamin D supplementation.   10. History of ulcerative colitis Continue Imuran. Needs outpatient follow-up with GI.   11. Urine retention Needing in and out intermittently . Continue Flomax  12.  Obesity. Placing the patient at high risk for poor outcomes. Outpatient consultation recommended. Body mass index is 33.67 kg/m.  Nutrition Problem: Inadequate oral intake Etiology: poor appetite (AMS) Interventions: Interventions: Ensure Enlive (each supplement provides 350kcal and 20 grams of protein)      Diet: Regular diet DVT Prophylaxis: Subcutaneous Heparin    apixaban (ELIQUIS) tablet 5 mg    Advance goals of care discussion: Full code  Family Communication: no family was present at bedside, at the time of interview.   Disposition:  Status is: Inpatient  Remains inpatient appropriate because:Unsafe d/c plan   Dispo:  Patient From:    Planned Disposition:    Expected discharge date: 09/04/2020  Medically stable for discharge:           Subjective: No nausea no vomiting.  No fever no chills.  No chest pain. Agitated in the evening. Requiring restraints. Tells me that he thinks that he is in [place], in hospital, in some [place] unit.  He is able to read on the board and tells  me that he is at [place].  Physical Exam:  General: Appear in mild distress, no Rash; Oral Mucosa Clear, moist. no Abnormal Neck Mass Or lumps, Conjunctiva normal  Cardiovascular: S1 and S2 Present, no Murmur, Respiratory: good respiratory effort, Bilateral Air entry present and CTA, no Crackles, no wheezes Abdomen: Bowel Sound present, Soft and no tenderness Extremities: no Pedal edema Neurology: alert and not oriented to time, place, and person affect emotionally labile. no new focal deficit Gait not checked due to patient safety concerns  Vitals:   08/30/20 1948 08/30/20 2235 08/31/20 0731 08/31/20 1345  BP: 128/64 123/76 99/74 105/61  Pulse: (!) 103 (!) 106 82 80  Resp: 20 18 18 16   Temp: 98.4 F (36.9 C) 98.9 F (37.2 C) 97.7 F (36.5 C) 97.8 F (36.6 C)  TempSrc: Oral Oral  Oral  SpO2: 98% 92% 94% 95%  Weight:      Height:        Intake/Output Summary (Last 24 hours) at 08/31/2020 1848 Last data filed at 08/31/2020 6803 Gross per 24 hour  Intake 480 ml  Output 1325 ml  Net -845 ml   Filed Weights   07/20/20 0922 08/21/20 0411 08/22/20 0550  Weight: 115.2 kg 97.2 kg 97.5 kg    Data Reviewed: I have personally reviewed and interpreted daily labs, tele strips, imagings as discussed above. I reviewed all nursing notes, pharmacy notes, vitals, pertinent old records I have discussed plan of care as described above with RN and patient/family.  CBC: Recent Labs  Lab 08/29/20 0328  WBC 6.5  HGB 12.9*  HCT 37.5*  MCV 100.0  PLT 212   Basic Metabolic Panel: Recent Labs  Lab 08/29/20 0328 08/31/20 1132  NA 138 140  K 4.0 4.5  CL 99 102  CO2 29 28  GLUCOSE 102* 100*  BUN 7* 7*  CREATININE 0.88 0.98  CALCIUM 8.6* 8.9  MG 1.8  --     Studies: No results found.  Scheduled Meds: . amLODipine  5 mg Oral Daily  . apixaban  5 mg Oral BID  . atorvastatin  20 mg  Oral Daily  . azaTHIOprine  200 mg Oral Daily  . divalproex  750 mg Oral Q12H  . DULoxetine  60 mg Oral Daily  . feeding supplement  237 mL Oral QID  . insulin aspart  0-9 Units Subcutaneous TID WC  . melatonin  3 mg Oral QHS  . metoprolol tartrate  50 mg Oral BID  . nystatin   Topical TID  . OLANZapine  5 mg Oral BID  . protein supplement  1 Scoop Oral TID WC  . senna-docusate  1 tablet Oral QHS  . tamsulosin  0.4 mg Oral Daily  . vitamin B-12  1,000 mcg Oral Daily  . Vitamin D (Ergocalciferol)  50,000 Units Oral Q7 days   Continuous Infusions: PRN Meds: acetaminophen **OR** [DISCONTINUED] acetaminophen, diphenhydrAMINE, diphenhydrAMINE-zinc acetate, haloperidol lactate, sodium chloride  Time spent: 35 minutes  Author: Berle Mull, MD [facility] 08/31/2020 6:48 PM  To reach On-call, see care teams to locate the attending and reach out via [email or web address]. Between 7PM-7AM, please contact night-coverage If you still have difficulty reaching the attending provider, please page the [facility] (Director on Call) for [facility] on amion for assistance.

## 2020-09-01 DIAGNOSIS — R410 Disorientation, unspecified: Secondary | ICD-10-CM | POA: Diagnosis not present

## 2020-09-01 LAB — GLUCOSE, CAPILLARY
Glucose-Capillary: 94 mg/dL (ref 70–99)
Glucose-Capillary: 117 mg/dL — ABNORMAL HIGH (ref 70–99)
Glucose-Capillary: 103 mg/dL — ABNORMAL HIGH (ref 70–99)

## 2020-09-01 LAB — AMMONIA: Ammonia: 12 umol/L (ref 9–35)

## 2020-09-01 MED ORDER — ENSURE ENLIVE PO LIQD
237.0000 mL | Freq: Two times a day (BID) | ORAL | Status: DC
  Administered 2020-09-02 – 2020-09-09 (×13): 237 mL via ORAL

## 2020-09-01 MED ORDER — HALOPERIDOL 2 MG PO TABS
2.0000 mg | ORAL_TABLET | Freq: Two times a day (BID) | ORAL | Status: DC
  Administered 2020-09-01 – 2020-09-02 (×3): 2 mg via ORAL
  Filled 2020-09-01 (×3): qty 1

## 2020-09-01 MED ORDER — LEVOTHYROXINE SODIUM 25 MCG PO TABS
25.0000 ug | ORAL_TABLET | Freq: Every day | ORAL | Status: DC
  Administered 2020-09-01 – 2020-09-08 (×7): 25 ug via ORAL
  Filled 2020-09-01 (×9): qty 1

## 2020-09-01 MED ORDER — OLANZAPINE 5 MG PO TABS
10.0000 mg | ORAL_TABLET | Freq: Two times a day (BID) | ORAL | Status: DC
  Administered 2020-09-01 – 2020-09-02 (×2): 10 mg via ORAL
  Filled 2020-09-01 (×2): qty 2

## 2020-09-01 NOTE — Progress Notes (Signed)
Nutrition Follow-up  DOCUMENTATION CODES:   Obesity unspecified  INTERVENTION:   -Ensure Enlive po BID, each supplement provides 350 kcal and 20 grams of protein  -Magic cup TID with meals, each supplement provides 290 kcal and 9 grams of protein  -D/c Beneprotein powder   NUTRITION DIAGNOSIS:   Inadequate oral intake related to poor appetite (AMS) as evidenced by meal completion < 50%.  Ongoing.  GOAL:   Patient will meet greater than or equal to 90% of their needs  Progressing.  MONITOR:   PO intake,Supplement acceptance,Labs,Weight trends,I & O's  ASSESSMENT:   69 year old gentleman prior history of atrial fibrillation on anticoagulation, dementia and depression brought to ED for increasing agitation.  As per the family ,he has been progressively having worsening dementia since last 2 years. Multiple medications have been attempted in the outpatient setting without any improvement.  His initial CT of the head on admission was negative for acute findings.Hospital course remarkable for persistent agitation requiring soft restraints.  Patient's PO intakes vary, today consumed 10-100% of meals. Pt is ordered Ensure QID, at most drinking 3 but most days accepts 2. Will change to BID and d/c Beneprotein.  Per MD note, current plan is to treat pt's agitation.   Admission weight: 253 lbs.  Last recorded weight 12/13: 214 lbs.  Medications: Senokot, Vitamin B-12  Labs reviewed:  CBGs: 94-117  Diet Order:   Diet Order            Diet regular Room service appropriate? Yes; Fluid consistency: Thin  Diet effective now                 EDUCATION NEEDS:   No education needs have been identified at this time  Skin:  Skin Assessment: Reviewed RN Assessment  Last BM:  12/17  Height:   Ht Readings from Last 1 Encounters:  07/20/20 5' 7"  (1.702 m)    Weight:   Wt Readings from Last 1 Encounters:  08/22/20 97.5 kg   BMI:  Body mass index is 33.67  kg/m.  Estimated Nutritional Needs:   Kcal:  1950-2150  Protein:  100-110g  Fluid:  2L/day  Clayton Bibles, MS, RD, LDN Inpatient Clinical Dietitian Contact information available via Amion

## 2020-09-01 NOTE — Progress Notes (Signed)
[facility] Progress Note  Patient: [name]    BHA:[id number]  DOA: 07/13/2020     Date of Service: the patient was seen and examined on 09/01/2020  Brief hospital course: Past medical history of A. fib, dementia, depression, anxiety, hypothyroidism. 07/13/2020 presented with confusion and agitation. 11/8//2021 benzodiazepine discontinued. 11/24 2021 Tegretol was started. Psychiatry currently recommends inpatient Geri psych placement given his ongoing confusion and agitation. Currently IVC on 12/21 2021.  Currently plan is treat agitation.  Assessment and Plan: 1. Acute delirium superimposed on progressive chronic dementia Initially precipitated by recent death in the family about 2 years ago. Head CT done ,negative for any acute abnormalities.  Unable to do MRI due to lack of cooperation. no focal neurological symptoms, therefore will not change management. Metabolic work-up including alcohol, ammonia, TSH, thiamine, cortisol, RPR unremarkable. B12 was generally lower, replaced. Psychiatry has been consulted. Medical management initiated. Benzodiazepines were discontinued. Seroquel was initiated but this was discontinued due to concern for QT prolongation. Patient was also started on Tegretol which was discontinued and switched to Depakote. Depakote was increased and Zyprexa was added instead of Seroquel. Patient continues to have sundowning. Add Haldol. Depakote dose increased as well. Zyprexa dose increased on 12/23. As needed restraints. Psychiatry recommending inpatient psychiatric admission inpatient admission. Patient currently medically stable for discharge . Social worker closely following for disposition. Patient is on wait list for [facility] gero psych unit. Patient remains at times difficult to redirect and per nursing staff, can become physically violent without warning Stable at present. Awaiting placement per TOC.  2. Chronic atrial fibrillation On  Lopressor for rate control. Eliquis for anticoagulation.  Outpatient follow-up with cardiology for further discussions on ongoing anticoagulation due to history of progressive worsening dementia.  3. Prolonged QTC-resolved Last QT on 12/22 466, improved. Monitor electrolytes  4. Type 2 diabetes mellitus well controlled Hemoglobin A1c 6.9 as per 06/30/2020. On sliding scale insulin.   5. Hypothyroidism On Synthroid.   6. Hypertension Monitor BP .On Norvasc, Lopressor.  BP stable  7. Depression/anxiety Continue current medications. Psychiatry had been following. On mood stabilizer. Check Depakote level and LFT as well as ammonia level. Needs inpatient psych admission.   8. Vitamin B12 deficiency Continue vitamin B12 supplementation.   9. Vitamin D deficiency Continue Vitamin D supplementation.   10. History of ulcerative colitis Continue Imuran. Needs outpatient follow-up with GI.   11. Urine retention Needing in and out intermittently . Continue Flomax  12.  Obesity. Placing the patient at high risk for poor outcomes. Outpatient consultation recommended. Body mass index is 33.67 kg/m.  Nutrition Problem: Inadequate oral intake Etiology: poor appetite (AMS) Interventions: Interventions: Ensure Enlive (each supplement provides 350kcal and 20 grams of protein)      Diet: Regular diet DVT Prophylaxis: Subcutaneous Heparin    apixaban (ELIQUIS) tablet 5 mg    Advance goals of care discussion: Full code  Family Communication: no family was present at bedside, at the time of interview.   Disposition:  Status is: Inpatient  Remains inpatient appropriate because:Unsafe d/c plan   Dispo:  Patient From:    Planned Disposition:    Expected discharge date: 09/04/2020  Medically stable for discharge:           Subjective: No acute complaint no nausea no vomiting.  Agitated at night.  Physical Exam:  General: Appear in mild  distress, no Rash; Oral Mucosa Clear, moist. no Abnormal Neck Mass Or lumps, Conjunctiva normal  Cardiovascular: S1 and  S2 Present, no Murmur, Respiratory: good respiratory effort, Bilateral Air entry present and CTA, no Crackles, no wheezes Abdomen: Bowel Sound present, Soft and no tenderness Extremities: no Pedal edema Neurology: alert and not oriented to time, place, and person affect emotionally labile. no new focal deficit Gait not checked due to patient safety concerns  Vitals:   08/31/20 1345 08/31/20 1919 09/01/20 0512 09/01/20 1139  BP: 105/61 124/84 102/61 102/69  Pulse: 80 89 75 84  Resp: 16 18 18 18   Temp: 97.8 F (36.6 C) 97.9 F (36.6 C) 97.7 F (36.5 C) 97.9 F (36.6 C)  TempSrc: Oral  Oral Oral  SpO2: 95% 99% 92% 98%  Weight:      Height:        Intake/Output Summary (Last 24 hours) at 09/01/2020 1921 Last data filed at 09/01/2020 1421 Gross per 24 hour  Intake 695 ml  Output 1075 ml  Net -380 ml   Filed Weights   07/20/20 0922 08/21/20 0411 08/22/20 0550  Weight: 115.2 kg 97.2 kg 97.5 kg    Data Reviewed: I have personally reviewed and interpreted daily labs, tele strips, imagings as discussed above. I reviewed all nursing notes, pharmacy notes, vitals, pertinent old records I have discussed plan of care as described above with RN and patient/family.  CBC: Recent Labs  Lab 08/29/20 0328  WBC 6.5  HGB 12.9*  HCT 37.5*  MCV 100.0  PLT 110   Basic Metabolic Panel: Recent Labs  Lab 08/29/20 0328 08/31/20 1132  NA 138 140  K 4.0 4.5  CL 99 102  CO2 29 28  GLUCOSE 102* 100*  BUN 7* 7*  CREATININE 0.88 0.98  CALCIUM 8.6* 8.9  MG 1.8  --     Studies: No results found.  Scheduled Meds: . amLODipine  5 mg Oral Daily  . apixaban  5 mg Oral BID  . atorvastatin  20 mg Oral Daily  . azaTHIOprine  200 mg Oral Daily  . divalproex  750 mg Oral Q12H  . DULoxetine  60 mg Oral Daily  . feeding supplement  237 mL Oral BID BM  . haloperidol  2  mg Oral BID  . levothyroxine  25 mcg Oral Q0600  . melatonin  3 mg Oral QHS  . metoprolol tartrate  50 mg Oral BID  . nystatin   Topical TID  . OLANZapine  10 mg Oral BID  . senna-docusate  1 tablet Oral QHS  . tamsulosin  0.4 mg Oral Daily  . vitamin B-12  1,000 mcg Oral Daily  . Vitamin D (Ergocalciferol)  50,000 Units Oral Q7 days   Continuous Infusions: PRN Meds: acetaminophen **OR** [DISCONTINUED] acetaminophen, diphenhydrAMINE, diphenhydrAMINE-zinc acetate, haloperidol lactate, sodium chloride  Time spent: 35 minutes  Author: Berle Mull, MD [facility] 09/01/2020 7:21 PM  To reach On-call, see care teams to locate the attending and reach out via [email or web address]. Between 7PM-7AM, please contact night-coverage If you still have difficulty reaching the attending provider, please page the [facility] (Director on Call) for [facility] on amion for assistance.

## 2020-09-01 NOTE — Progress Notes (Signed)
Patient still continues to get agitated and more confused later in the evening.  Dr. Posey Pronto made aware.  Patient started to get very agitated, leaning towards aggressive.  Dr. Posey Pronto came up and bilateral wrist restraints were ordered.  Security came up and they were applied with patient placed back in bed. Patient now resting in bed with sitter at bedside.  Oncoming nurse made aware.    Virginia Rochester, RN

## 2020-09-02 DIAGNOSIS — R410 Disorientation, unspecified: Secondary | ICD-10-CM | POA: Diagnosis not present

## 2020-09-02 LAB — GLUCOSE, CAPILLARY
Glucose-Capillary: 96 mg/dL (ref 70–99)
Glucose-Capillary: 131 mg/dL — ABNORMAL HIGH (ref 70–99)
Glucose-Capillary: 100 mg/dL — ABNORMAL HIGH (ref 70–99)
Glucose-Capillary: 98 mg/dL (ref 70–99)

## 2020-09-02 MED ORDER — OLANZAPINE 5 MG PO TABS
5.0000 mg | ORAL_TABLET | ORAL | Status: DC
  Administered 2020-09-03 – 2020-09-09 (×12): 5 mg via ORAL
  Filled 2020-09-02 (×14): qty 1

## 2020-09-02 MED ORDER — OLANZAPINE 5 MG PO TABS
10.0000 mg | ORAL_TABLET | Freq: Every day | ORAL | Status: DC
  Administered 2020-09-02 – 2020-09-08 (×7): 10 mg via ORAL
  Filled 2020-09-02 (×7): qty 2

## 2020-09-02 MED ORDER — HALOPERIDOL 5 MG PO TABS
5.0000 mg | ORAL_TABLET | Freq: Once | ORAL | Status: AC
  Administered 2020-09-02: 5 mg via ORAL
  Filled 2020-09-02: qty 1

## 2020-09-02 NOTE — Progress Notes (Signed)
[facility] Progress Note  Patient: [name]    ALP:[id number]  DOA: 07/13/2020     Date of Service: the patient was seen and examined on 09/02/2020  Brief hospital course: Past medical history of A. fib, dementia, depression, anxiety, hypothyroidism. 07/13/2020 presented with confusion and agitation. 11/8//2021 benzodiazepine discontinued. 11/24 2021 Tegretol was started. Psychiatry currently recommends inpatient Geri psych placement given his ongoing confusion and agitation. Currently IVC on 12/21/ 2021.  Currently plan is treat agitation.  Assessment and Plan: 1. Acute delirium superimposed on progressive chronic dementia Initially precipitated by recent death in the family about 2 years ago. Head CT done ,negative for any acute abnormalities.  Unable to do MRI due to lack of cooperation. no focal neurological symptoms, therefore will not change management. Metabolic work-up including alcohol, ammonia, TSH, thiamine, cortisol, RPR unremarkable. B12 was generally lower, replaced. Psychiatry has been consulted. Medical management initiated. Benzodiazepines were discontinued. Seroquel was initiated but this was discontinued due to concern for QT prolongation. Patient was also started on Tegretol which was discontinued and switched to Depakote. Depakote was increased and Zyprexa was added instead of Seroquel. Patient continues to have sundowning. Depakote dose increased as well. Zyprexa dose increased on 12/23. Change to 3 times daily regimen after discussion with the pharmacy. As needed restraints. Psychiatry recommending inpatient psychiatric admission inpatient admission. Patient currently medically stable for discharge . Social worker closely following for disposition. Patient is on wait list for [facility] gero psych unit. Patient remains at times difficult to redirect and per nursing staff, can become physically violent without warning Stable at present. Awaiting  placement per TOC.  2. Chronic atrial fibrillation On Lopressor for rate control. Eliquis for anticoagulation.  Outpatient follow-up with cardiology for further discussions on ongoing anticoagulation due to history of progressive worsening dementia.  3. Prolonged QTC-resolved Last QT on 12/22 466, improved. Monitor electrolytes  4. Type 2 diabetes mellitus well controlled Hemoglobin A1c 6.9 as per 06/30/2020.  Was on sliding scale insulin. Currently discontinued as the patient's sugars are well controlled.  5. Hypothyroidism On Synthroid.   6. Hypertension Blood pressure soft. Continue home medications. Was on metoprolol and Norvasc  7. Depression/anxiety Continue current medications. Psychiatry had been following. On mood stabilizer. Depakote level still low Check Depakote level and LFT on Monday Needs inpatient psych admission.   8. Vitamin B12 deficiency Continue vitamin B12 supplementation.   9. Vitamin D deficiency Continue Vitamin D supplementation.   10. History of ulcerative colitis Continue Imuran. Needs outpatient follow-up with GI.   11. Urine retention Needing in and out intermittently . Continue Flomax  12.  Obesity. Placing the patient at high risk for poor outcomes. Outpatient consultation recommended. Body mass index is 33.67 kg/m.  Nutrition Problem: Inadequate oral intake Etiology: poor appetite (AMS) Interventions: Interventions: Ensure Enlive (each supplement provides 350kcal and 20 grams of protein)      Diet: Regular diet DVT Prophylaxis: Subcutaneous Heparin    apixaban (ELIQUIS) tablet 5 mg    Advance goals of care discussion: Full code  Family Communication: no family was present at bedside, at the time of interview.   Disposition:  Status is: Inpatient  Remains inpatient appropriate because:Unsafe d/c plan   Dispo:  Patient From:    Planned Disposition:    Expected discharge date:  09/04/2020  Medically stable for discharge:  Yes         Subjective agitated overnight. Needing restraint. No nausea no vomiting. No fever no chills. Physical Exam:  General: Appear in mild distress, no Rash; Oral Mucosa Clear, moist. no Abnormal Neck Mass Or lumps, Conjunctiva normal  Cardiovascular: S1 and S2 Present, no Murmur, Respiratory: good respiratory effort, Bilateral Air entry present and CTA, no Crackles, no wheezes Abdomen: Bowel Sound present, Soft and no tenderness Extremities: no Pedal edema Neurology: alert and not oriented to time, place, and person affect emotionally labile. no new focal deficit Gait not checked due to patient safety concerns  Vitals:   09/01/20 2322 09/02/20 0632 09/02/20 1020 09/02/20 1412  BP: 103/66 108/68 91/66 111/81  Pulse: 84 83 91 86  Resp:  20  17  Temp:  98.1 F (36.7 C)  97.9 F (36.6 C)  TempSrc:  Oral  Oral  SpO2:  100%  99%  Weight:      Height:        Intake/Output Summary (Last 24 hours) at 09/02/2020 1659 Last data filed at 09/02/2020 0740 Gross per 24 hour  Intake 580 ml  Output 1500 ml  Net -920 ml   Filed Weights   07/20/20 0922 08/21/20 0411 08/22/20 0550  Weight: 115.2 kg 97.2 kg 97.5 kg    Data Reviewed: I have personally reviewed and interpreted daily labs, tele strips, imagings as discussed above. I reviewed all nursing notes, pharmacy notes, vitals, pertinent old records I have discussed plan of care as described above with RN and patient/family.  CBC: Recent Labs  Lab 08/29/20 0328  WBC 6.5  HGB 12.9*  HCT 37.5*  MCV 100.0  PLT 076   Basic Metabolic Panel: Recent Labs  Lab 08/29/20 0328 08/31/20 1132  NA 138 140  K 4.0 4.5  CL 99 102  CO2 29 28  GLUCOSE 102* 100*  BUN 7* 7*  CREATININE 0.88 0.98  CALCIUM 8.6* 8.9  MG 1.8  --     Studies: No results found.  Scheduled Meds: . apixaban  5 mg Oral BID  . atorvastatin  20 mg Oral Daily  . azaTHIOprine  200 mg Oral Daily  .  divalproex  750 mg Oral Q12H  . DULoxetine  60 mg Oral Daily  . feeding supplement  237 mL Oral BID BM  . levothyroxine  25 mcg Oral Q0600  . melatonin  3 mg Oral QHS  . metoprolol tartrate  50 mg Oral BID  . nystatin   Topical TID  . OLANZapine  10 mg Oral QHS  . [START ON 09/03/2020] OLANZapine  5 mg Oral 2 times per day  . senna-docusate  1 tablet Oral QHS  . tamsulosin  0.4 mg Oral Daily  . vitamin B-12  1,000 mcg Oral Daily  . Vitamin D (Ergocalciferol)  50,000 Units Oral Q7 days   Continuous Infusions: PRN Meds: acetaminophen **OR** [DISCONTINUED] acetaminophen, diphenhydrAMINE, diphenhydrAMINE-zinc acetate, haloperidol lactate, sodium chloride  Time spent: 35 minutes  Author: Berle Mull, MD [facility] 09/02/2020 4:59 PM  To reach On-call, see care teams to locate the attending and reach out via [email or web address]. Between 7PM-7AM, please contact night-coverage If you still have difficulty reaching the attending provider, please page the [facility] (Director on Call) for [facility] on amion for assistance.

## 2020-09-02 NOTE — Progress Notes (Signed)
Patient out of restraints this shift and in pleasant mood. Pt escorted outside by nursing and patient's son Inocencio Homes.). Patient very calm and cooperative. Patient able to carry a conversation with son and nurses. [place] [employer] and ate a meal with his son, then returned to his room on [address].

## 2020-09-02 NOTE — Progress Notes (Signed)
1730- Patient started to become more confused and agitated this evening. Patient experiencing more hallucinations and delusional thinking. It is becoming harder to redirect or distract patient at this time.  1800- Patient started making demands regarding his wife and staying at a "hotel" tonight.Patient walking in the hallway, very unsteady on his feet. Unable to redirect patient to his room at this time.  1810- Patient redirected to his room. Very agitated. PRN haldol given at this time.

## 2020-09-03 DIAGNOSIS — R410 Disorientation, unspecified: Secondary | ICD-10-CM | POA: Diagnosis not present

## 2020-09-03 LAB — GLUCOSE, CAPILLARY
Glucose-Capillary: 105 mg/dL — ABNORMAL HIGH (ref 70–99)
Glucose-Capillary: 131 mg/dL — ABNORMAL HIGH (ref 70–99)
Glucose-Capillary: 88 mg/dL (ref 70–99)
Glucose-Capillary: 89 mg/dL (ref 70–99)

## 2020-09-03 MED ORDER — ALUM & MAG HYDROXIDE-SIMETH 200-200-20 MG/5ML PO SUSP
30.0000 mL | ORAL | Status: DC | PRN
  Administered 2020-09-03: 30 mL via ORAL
  Filled 2020-09-03: qty 30

## 2020-09-03 NOTE — Progress Notes (Signed)
[facility] Progress Note  Patient: [name]    RXV:[id number]  DOA: 07/13/2020     Date of Service: the patient was seen and examined on 09/03/2020  Brief hospital course: Past medical history of A. fib, dementia, depression, anxiety, hypothyroidism. 07/13/2020 presented with confusion and agitation. 11/8//2021 benzodiazepine discontinued. 11/24 2021 Tegretol was started. Psychiatry currently recommends inpatient Geri psych placement given his ongoing confusion and agitation. Currently IVC on 08/30/2020. Currently plan is continue to monitor agitation.  Assessment and Plan: 1. Acute delirium superimposed on progressive chronic dementia Initially precipitated by recent death in the family about 2 years ago. Head CT done ,negative for any acute abnormalities.  Unable to do MRI due to lack of cooperation. no focal neurological symptoms, therefore will not change management. Metabolic work-up including alcohol, ammonia, TSH, thiamine, cortisol, RPR unremarkable. B12 was relatively lower, replaced. Psychiatry has been consulted. Medical management initiated. Benzodiazepines were discontinued. Seroquel was initiated but this was discontinued due to concern for QT prolongation. Patient was also started on Tegretol which was discontinued and switched to Depakote. Depakote was increased and Zyprexa was added instead of Seroquel. Patient continues to have sundowning. Depakote dose increased as well. Zyprexa dose increased on 12/23. Change to 3 times daily regimen after discussion with the pharmacy. As needed restraints. Psychiatry recommending inpatient psychiatric admission inpatient admission. Patient currently medically stable for discharge.  2. Chronic atrial fibrillation On Lopressor for rate control. Eliquis for anticoagulation.  Outpatient follow-up with cardiology for further discussions on ongoing anticoagulation due to history of progressive worsening  dementia.  3. Prolonged QTC-resolved Last QT on 12/22 466, improved. Monitor electrolytes  4. Type 2 diabetes mellitus well controlled Hemoglobin A1c 6.9 as per 06/30/2020.  Was on sliding scale insulin. Currently discontinued as the patient's sugars are well controlled.  5. Hypothyroidism On Synthroid.   6. Hypertension Blood pressure soft. Continue home medications. Was on metoprolol and Norvasc  7. Depression/anxiety Continue current medications. Psychiatry had been following. On mood stabilizer. Depakote level still low Check Depakote level and LFT on Monday Needs inpatient psych admission.   8. Vitamin B12 deficiency Continue vitamin B12 supplementation.   9. Vitamin D deficiency Continue Vitamin D supplementation.   10. History of ulcerative colitis Continue Imuran. Needs outpatient follow-up with GI.   11. Urine retention Needing in and out intermittently . Continue Flomax  12.  Obesity. Placing the patient at high risk for poor outcomes. Outpatient consultation recommended. Body mass index is 33.67 kg/m.  Nutrition Problem: Inadequate oral intake Etiology: poor appetite (AMS) Interventions: Interventions: Ensure Enlive (each supplement provides 350kcal and 20 grams of protein)      Diet: regular diet DVT Prophylaxis:  apixaban (ELIQUIS) tablet 5 mg    Advance goals of care discussion: DNR  Family Communication: no family was present at bedside, at the time of interview.   Disposition:  Status is: Inpatient  Remains inpatient appropriate because:Altered mental status and Unsafe d/c plan Social worker closely following for disposition. Patient is on [facility] list for [facility] gero psych unit.Patient remains at times difficult to redirect and per nursing staff, can become physically violent without warning.  Dispo:  Patient From: Home  Planned Disposition: Geri-Psych  Expected discharge date: 09/07/2020  Medically stable for  discharge: Yes  Subjective: Continues to have episodes of agitation and night pain.  Controlled last night with IM Haldol without any restraint.  No acute events this morning.  Physical Exam:  General: Appear in mild distress, no Rash;  Cardiovascular: S1 and S2 Present, no Murmur, Respiratory: good respiratory effort, Bilateral Air entry present and CTA, no Crackles, no wheezes Abdomen: Bowel Sound present,  Neurology: alert Vitals:   09/02/20 2000 09/03/20 0000 09/03/20 0400 09/03/20 1151  BP: 139/68 107/63 119/80 (!) 101/57  Pulse: 84 98 82 65  Resp: 17 16 18 18   Temp: 98.4 F (36.9 C) 98.3 F (36.8 C) 97.7 F (36.5 C) 97.7 F (36.5 C)  TempSrc: Oral Oral Oral Oral  SpO2: 94% 93% 97% 96%  Weight:      Height:        Intake/Output Summary (Last 24 hours) at 09/03/2020 1643 Last data filed at 09/03/2020 1257 Gross per 24 hour  Intake 240 ml  Output --  Net 240 ml   Filed Weights   07/20/20 0922 08/21/20 0411 08/22/20 0550  Weight: 115.2 kg 97.2 kg 97.5 kg    Data Reviewed: I have personally reviewed and interpreted daily labs, tele strips, imagings as discussed above. I reviewed all nursing notes, pharmacy notes, vitals, pertinent old records I have discussed plan of care as described above with RN and patient/family.  CBC: Recent Labs  Lab 08/29/20 0328  WBC 6.5  HGB 12.9*  HCT 37.5*  MCV 100.0  PLT 157   Basic Metabolic Panel: Recent Labs  Lab 08/29/20 0328 08/31/20 1132  NA 138 140  K 4.0 4.5  CL 99 102  CO2 29 28  GLUCOSE 102* 100*  BUN 7* 7*  CREATININE 0.88 0.98  CALCIUM 8.6* 8.9  MG 1.8  --     Studies: No results found.  Scheduled Meds: . apixaban  5 mg Oral BID  . atorvastatin  20 mg Oral Daily  . azaTHIOprine  200 mg Oral Daily  . divalproex  750 mg Oral Q12H  . DULoxetine  60 mg Oral Daily  . feeding supplement  237 mL Oral BID BM  . levothyroxine  25 mcg Oral Q0600  . melatonin  3 mg Oral QHS  . metoprolol tartrate  50 mg  Oral BID  . nystatin   Topical TID  . OLANZapine  10 mg Oral QHS  . OLANZapine  5 mg Oral 2 times per day  . senna-docusate  1 tablet Oral QHS  . tamsulosin  0.4 mg Oral Daily  . vitamin B-12  1,000 mcg Oral Daily  . Vitamin D (Ergocalciferol)  50,000 Units Oral Q7 days   Continuous Infusions: PRN Meds: acetaminophen **OR** [DISCONTINUED] acetaminophen, diphenhydrAMINE, diphenhydrAMINE-zinc acetate, haloperidol lactate, sodium chloride  Time spent: 35 minutes  Author: Berle Mull, MD [facility] 09/03/2020 4:43 PM  To reach On-call, see care teams to locate the attending and reach out via [email or web address]. Between 7PM-7AM, please contact night-coverage If you still have difficulty reaching the attending provider, please page the [facility] (Director on Call) for [facility] on amion for assistance.

## 2020-09-04 DIAGNOSIS — R410 Disorientation, unspecified: Secondary | ICD-10-CM | POA: Diagnosis not present

## 2020-09-04 LAB — GLUCOSE, CAPILLARY
Glucose-Capillary: 91 mg/dL (ref 70–99)
Glucose-Capillary: 103 mg/dL — ABNORMAL HIGH (ref 70–99)
Glucose-Capillary: 88 mg/dL (ref 70–99)

## 2020-09-04 MED ORDER — METOPROLOL TARTRATE 25 MG PO TABS
25.0000 mg | ORAL_TABLET | Freq: Two times a day (BID) | ORAL | Status: DC
  Administered 2020-09-04 – 2020-09-05 (×3): 25 mg via ORAL
  Filled 2020-09-04 (×3): qty 1

## 2020-09-04 NOTE — Progress Notes (Signed)
[facility] Progress Note  Patient: [name]    [id number]:[id number]  DOA: 07/13/2020     Date of Service: the patient was seen and examined on 09/04/2020  Brief hospital course: Past medical history of A. fib, dementia, depression, anxiety, hypothyroidism. 07/13/2020 presented with confusion and agitation. 11/8//2021 benzodiazepine discontinued. 11/24 2021 Tegretol was started. Psychiatry currently recommends inpatient Geri psych placement given his ongoing confusion and agitation. Currently IVC on 08/30/2020. Currently plan is discharged to behavioral health/inpatient geri-psych facility.  Medically stable.  Assessment and Plan: 1. Acute delirium superimposed on progressive chronic dementia Initially precipitated by recent death in the family about 2 years ago. Head CT done ,negative for any acute abnormalities.  Unable to do MRI due to lack of cooperation. no focal neurological symptoms, therefore will not change management. Metabolic work-up including alcohol, ammonia, TSH, thiamine, cortisol, RPR unremarkable. B12 was relatively lower, replaced. Psychiatry has been consulted. Medical management initiated. Benzodiazepines were discontinued. Patient was also started on Tegretol which was discontinued and switched to Depakote. Seroquel was initiated but this was discontinued due to concern for QT prolongation. Depakote was increased and Zyprexa was added instead of Seroquel. Based on the Depakote level Depakote dose was modified. Zyprexa dose increased on 12/23. Change to 3 times daily regimen after discussion with the pharmacy. As needed restraints. Psychiatry recommending inpatient psychiatric admission inpatient admission. Patient currently medically stable for discharge.  2. Chronic atrial fibrillation Essential hypertension Eliquis for anticoagulation. Blood pressure has been soft therefore medication dose has been adjusted. Was on On Lopressor for rate control.   Outpatient follow-up with cardiology for further discussions on ongoing anticoagulation due to history of progressive worsening dementia.  3. Prolonged QTC-resolved Last QT on 12/22 466, improved. Monitor electrolytes  4. Type 2 diabetes mellitus well controlled Hemoglobin A1c 6.9 as per 06/30/2020.  Was on sliding scale insulin. Currently discontinued as the patient's sugars are well controlled.  5. Hypothyroidism On Synthroid.   6. Hypertension Blood pressure soft. Continue home medications. Was on metoprolol and Norvasc  7. Depression/anxiety Continue current medications. Psychiatry had been following. On mood stabilizer. Depakote level still low Check Depakote level and LFT on Monday Needs inpatient psych admission.   8. Vitamin B12 deficiency Continue vitamin B12 supplementation.   9. Vitamin D deficiency Continue Vitamin D supplementation.   10. History of ulcerative colitis Continue Imuran. Needs outpatient follow-up with GI.   11. Urine retention Needing in and out intermittently . Continue Flomax  12.  Obesity. Placing the patient at high risk for poor outcomes. Outpatient consultation recommended. Body mass index is 33.67 kg/m.  Nutrition Problem: Inadequate oral intake Etiology: poor appetite (AMS) Interventions: Interventions: Ensure Enlive (each supplement provides 350kcal and 20 grams of protein)      Diet: regular diet DVT Prophylaxis:  apixaban (ELIQUIS) tablet 5 mg    Advance goals of care discussion: DNR  Family Communication: no family was present at bedside, at the time of interview.   Disposition:  Status is: Inpatient  Remains inpatient appropriate because:Altered mental status and Unsafe d/c plan Patient is on wait list for [facility] gero psych unit. Dispo:  Patient From: Home  Planned Disposition: Geri-Psych  Expected discharge date: 09/07/2020  Medically stable for discharge: Yes  Subjective: Seen  twice.  No agitation.  No nausea no vomiting no fever no chills.  Physical Exam:  General: Appear in mild distress, no Rash;  Cardiovascular: S1 and S2 Present, no Murmur, Respiratory: good respiratory effort, Bilateral Air entry  present and CTA, no Crackles, no wheezes Abdomen: Bowel Sound present,  Neurology: alert Vitals:   09/03/20 0400 09/03/20 1151 09/03/20 2227 09/04/20 1557  BP: 119/80 (!) 101/57 (!) 94/52 129/81  Pulse: 82 65 81 86  Resp: 18 18 17 17   Temp: 97.7 F (36.5 C) 97.7 F (36.5 C) 97.9 F (36.6 C) (!) 97.5 F (36.4 C)  TempSrc: Oral Oral Oral Oral  SpO2: 97% 96% 95% 97%  Weight:      Height:        Intake/Output Summary (Last 24 hours) at 09/04/2020 1827 Last data filed at 09/04/2020 1601 Gross per 24 hour  Intake 1320 ml  Output --  Net 1320 ml   Filed Weights   07/20/20 0922 08/21/20 0411 08/22/20 0550  Weight: 115.2 kg 97.2 kg 97.5 kg    Data Reviewed: I have personally reviewed and interpreted daily labs, tele strips, imagings as discussed above. I reviewed all nursing notes, pharmacy notes, vitals, pertinent old records I have discussed plan of care as described above with RN and patient/family.  CBC: Recent Labs  Lab 08/29/20 0328  WBC 6.5  HGB 12.9*  HCT 37.5*  MCV 100.0  PLT 888   Basic Metabolic Panel: Recent Labs  Lab 08/29/20 0328 08/31/20 1132  NA 138 140  K 4.0 4.5  CL 99 102  CO2 29 28  GLUCOSE 102* 100*  BUN 7* 7*  CREATININE 0.88 0.98  CALCIUM 8.6* 8.9  MG 1.8  --     Studies: No results found.  Scheduled Meds: . apixaban  5 mg Oral BID  . atorvastatin  20 mg Oral Daily  . azaTHIOprine  200 mg Oral Daily  . divalproex  750 mg Oral Q12H  . DULoxetine  60 mg Oral Daily  . feeding supplement  237 mL Oral BID BM  . levothyroxine  25 mcg Oral Q0600  . melatonin  3 mg Oral QHS  . metoprolol tartrate  25 mg Oral BID  . nystatin   Topical TID  . OLANZapine  10 mg Oral QHS  . OLANZapine  5 mg Oral 2 times per  day  . senna-docusate  1 tablet Oral QHS  . tamsulosin  0.4 mg Oral Daily  . vitamin B-12  1,000 mcg Oral Daily  . Vitamin D (Ergocalciferol)  50,000 Units Oral Q7 days   Continuous Infusions: PRN Meds: acetaminophen **OR** [DISCONTINUED] acetaminophen, alum & mag hydroxide-simeth, diphenhydrAMINE, diphenhydrAMINE-zinc acetate, haloperidol lactate, sodium chloride  Time spent: 35 minutes  Author: Berle Mull, MD [facility] 09/04/2020 6:27 PM  To reach On-call, see care teams to locate the attending and reach out via [email or web address]. Between 7PM-7AM, please contact night-coverage If you still have difficulty reaching the attending provider, please page the [facility] (Director on Call) for [facility] on amion for assistance.

## 2020-09-05 DIAGNOSIS — R410 Disorientation, unspecified: Secondary | ICD-10-CM | POA: Diagnosis not present

## 2020-09-05 LAB — GLUCOSE, CAPILLARY
Glucose-Capillary: 90 mg/dL (ref 70–99)
Glucose-Capillary: 138 mg/dL — ABNORMAL HIGH (ref 70–99)
Glucose-Capillary: 143 mg/dL — ABNORMAL HIGH (ref 70–99)
Glucose-Capillary: 108 mg/dL — ABNORMAL HIGH (ref 70–99)

## 2020-09-05 LAB — COMPREHENSIVE METABOLIC PANEL
ALT: 10 U/L (ref 0–44)
AST: 21 U/L (ref 15–41)
Albumin: 2.7 g/dL — ABNORMAL LOW (ref 3.5–5.0)
Alkaline Phosphatase: 51 U/L (ref 38–126)
Anion gap: 9 (ref 5–15)
BUN: 8 mg/dL (ref 8–23)
CO2: 27 mmol/L (ref 22–32)
Calcium: 8.4 mg/dL — ABNORMAL LOW (ref 8.9–10.3)
Chloride: 101 mmol/L (ref 98–111)
Creatinine, Ser: 1.02 mg/dL (ref 0.61–1.24)
GFR, Estimated: >60 mL/min (ref >60)
Glucose, Bld: 92 mg/dL (ref 70–99)
Potassium: 3.4 mmol/L — ABNORMAL LOW (ref 3.5–5.1)
Sodium: 137 mmol/L (ref 135–145)
Total Bilirubin: 0.9 mg/dL (ref 0.3–1.2)
Total Protein: 5.1 g/dL — ABNORMAL LOW (ref 6.5–8.1)

## 2020-09-05 LAB — VALPROIC ACID LEVEL: Valproic Acid Lvl: 55 ug/mL (ref 50.0–100.0)

## 2020-09-05 MED ORDER — POLYETHYLENE GLYCOL 3350 17 G PO PACK
17.0000 g | PACK | Freq: Every day | ORAL | Status: DC
  Administered 2020-09-05 – 2020-09-09 (×5): 17 g via ORAL
  Filled 2020-09-05 (×5): qty 1

## 2020-09-05 MED ORDER — POTASSIUM CHLORIDE CRYS ER 20 MEQ PO TBCR
40.0000 meq | EXTENDED_RELEASE_TABLET | ORAL | Status: AC
  Administered 2020-09-05 (×2): 40 meq via ORAL
  Filled 2020-09-05 (×2): qty 2

## 2020-09-05 MED ORDER — LACTULOSE 10 GM/15ML PO SOLN
20.0000 g | Freq: Once | ORAL | Status: AC
  Administered 2020-09-05: 20 g via ORAL
  Filled 2020-09-05: qty 30

## 2020-09-05 MED ORDER — HALOPERIDOL LACTATE 5 MG/ML IJ SOLN
INTRAMUSCULAR | Status: AC
  Filled 2020-09-05: qty 1

## 2020-09-05 MED ORDER — SENNOSIDES-DOCUSATE SODIUM 8.6-50 MG PO TABS
1.0000 | ORAL_TABLET | Freq: Two times a day (BID) | ORAL | Status: DC
  Administered 2020-09-05 – 2020-09-09 (×9): 1 via ORAL
  Filled 2020-09-05 (×9): qty 1

## 2020-09-05 MED ORDER — HALOPERIDOL LACTATE 5 MG/ML IJ SOLN
5.0000 mg | Freq: Once | INTRAMUSCULAR | Status: AC
  Administered 2020-09-05: 5 mg via INTRAMUSCULAR
  Filled 2020-09-05: qty 1

## 2020-09-05 MED ORDER — HALOPERIDOL LACTATE 5 MG/ML IJ SOLN
5.0000 mg | Freq: Once | INTRAMUSCULAR | Status: AC
  Administered 2020-09-05: 5 mg via INTRAMUSCULAR

## 2020-09-05 MED ORDER — HALOPERIDOL LACTATE 5 MG/ML IJ SOLN: 5.0000 mg | Freq: Four times a day (QID) | INTRAMUSCULAR | Status: DC | PRN

## 2020-09-05 NOTE — Progress Notes (Signed)
[facility] Progress Note  Patient: [name]    HYW:[id number]  DOA: 07/13/2020     Date of Service: the patient was seen and examined on 09/05/2020  Brief hospital course: Past medical history of A. fib, dementia, depression, anxiety, hypothyroidism. 07/13/2020 presented with confusion and agitation. 11/8//2021 benzodiazepine discontinued. 11/24 2021 Tegretol was started. Psychiatry currently recommends inpatient Geri psych placement given his ongoing confusion and agitation. Currently IVC on 08/30/2020. Currently plan is discharged to behavioral health/inpatient geri-psych facility.  Medically stable.  Assessment and Plan: 1. Acute delirium superimposed on progressive chronic dementia Initially precipitated by recent death in the family about 2 years ago. Head CT done ,negative for any acute abnormalities.  Unable to do MRI due to lack of cooperation. no focal neurological symptoms, therefore will not change management. Metabolic work-up including alcohol, ammonia, TSH, thiamine, cortisol, RPR unremarkable. B12 was relatively lower, replaced. Psychiatry has been consulted. Medical management initiated. Benzodiazepines were discontinued. Patient was also started on Tegretol which was discontinued and switched to Depakote. Seroquel was initiated but this was discontinued due to concern for QT prolongation. Depakote was increased and Zyprexa was added instead of Seroquel. Based on the Depakote level Depakote dose was modified. Zyprexa dose increased on 12/23. Change to 3 times daily regimen after discussion with the pharmacy. As needed restraints. Psychiatry recommending inpatient psychiatric admission inpatient admission. Patient currently medically stable for discharge.  2. Chronic atrial fibrillation Essential hypertension Eliquis for anticoagulation. Blood pressure has been soft therefore medication dose has been adjusted. Was on On Lopressor for rate control.   Outpatient follow-up with cardiology for further discussions on ongoing anticoagulation due to history of progressive worsening dementia.  3. Prolonged QTC-resolved Last QT on 12/22 466, improved.  QTC on 12/27 again 511 likely associated with hypokalemia. We will correct the potassium and recheck EKG and QTC tomorrow.  4. Type 2 diabetes mellitus well controlled Hemoglobin A1c 6.9 as per 06/30/2020.  Was on sliding scale insulin. Currently discontinued as the patient's sugars are well controlled.  5. Hypothyroidism On Synthroid.   6. Hypertension Blood pressure soft. Continue home medications. Was on metoprolol and Norvasc  7. Depression/anxiety Continue current medications. Psychiatry had been following. On mood stabilizer. Depakote level now adequate.  750 twice daily.  Continue the dose. Per psychiatry now stable enough although patient remains hallucinating and confused. Will need memory care unit instead of an SNF.  8. Vitamin B12 deficiency Continue vitamin B12 supplementation.   9. Vitamin D deficiency Continue Vitamin D supplementation.   10. History of ulcerative colitis Continue Imuran. Needs outpatient follow-up with GI.   11. Urine retention Needing in and out intermittently . Continue Flomax  12.  Obesity. Placing the patient at high risk for poor outcomes. Outpatient consultation recommended. Body mass index is 33.67 kg/m.  Nutrition Problem: Inadequate oral intake Etiology: poor appetite (AMS) Interventions: Interventions: Ensure Enlive (each supplement provides 350kcal and 20 grams of protein)      Diet: regular diet DVT Prophylaxis:  apixaban (ELIQUIS) tablet 5 mg    Advance goals of care discussion: DNR  Family Communication: no family was present at bedside, at the time of interview.   Disposition:  Status is: Inpatient  Remains inpatient appropriate because:Altered mental status and Unsafe d/c plan Will  need memory care unit.  Dispo:  Patient From: Home  Planned Disposition: Memory care unit  Expected discharge date: 09/07/2020  Medically stable for discharge: Yes  Subjective: Continues to have some agitation.  No  nausea no vomiting.  No fever or chills.  Trying to leave the floor at night.  Physical Exam:  General: Appear in mild distress, no Rash;  Cardiovascular: S1 and S2 Present, no Murmur, Respiratory: good respiratory effort, Bilateral Air entry present and CTA, no Crackles, no wheezes Abdomen: Bowel Sound present,  Neurology: alert Vitals:   09/04/20 2256 09/04/20 2257 09/05/20 0542 09/05/20 1229  BP: 115/67  98/67 97/60  Pulse: 67 67 80 89  Resp: 20  20 20   Temp: 97.7 F (36.5 C)  97.8 F (36.6 C) 97.6 F (36.4 C)  TempSrc: Oral   Oral  SpO2: 94% 94% 95% 94%  Weight:      Height:        Intake/Output Summary (Last 24 hours) at 09/05/2020 1941 Last data filed at 09/05/2020 0600 Gross per 24 hour  Intake 720 ml  Output --  Net 720 ml   Filed Weights   07/20/20 0922 08/21/20 0411 08/22/20 0550  Weight: 115.2 kg 97.2 kg 97.5 kg    Data Reviewed: I have personally reviewed and interpreted daily labs, tele strips, imagings as discussed above. I reviewed all nursing notes, pharmacy notes, vitals, pertinent old records I have discussed plan of care as described above with RN and patient/family.  CBC: No results for input(s): WBC, NEUTROABS, HGB, HCT, MCV, PLT in the last 168 hours. Basic Metabolic Panel: Recent Labs  Lab 08/31/20 1132 09/05/20 0452  NA 140 137  K 4.5 3.4*  CL 102 101  CO2 28 27  GLUCOSE 100* 92  BUN 7* 8  CREATININE 0.98 1.02  CALCIUM 8.9 8.4*    Studies: No results found.  Scheduled Meds: . apixaban  5 mg Oral BID  . atorvastatin  20 mg Oral Daily  . azaTHIOprine  200 mg Oral Daily  . divalproex  750 mg Oral Q12H  . DULoxetine  60 mg Oral Daily  . feeding supplement  237 mL Oral BID BM  . levothyroxine  25 mcg Oral Q0600   . melatonin  3 mg Oral QHS  . nystatin   Topical TID  . OLANZapine  10 mg Oral QHS  . OLANZapine  5 mg Oral 2 times per day  . polyethylene glycol  17 g Oral Daily  . potassium chloride  40 mEq Oral Q2H  . senna-docusate  1 tablet Oral BID  . tamsulosin  0.4 mg Oral Daily  . vitamin B-12  1,000 mcg Oral Daily  . Vitamin D (Ergocalciferol)  50,000 Units Oral Q7 days   Continuous Infusions: PRN Meds: acetaminophen **OR** [DISCONTINUED] acetaminophen, alum & mag hydroxide-simeth, diphenhydrAMINE, diphenhydrAMINE-zinc acetate, sodium chloride  Time spent: 35 minutes  Author: Berle Mull, MD [facility] 09/05/2020 7:41 PM  To reach On-call, see care teams to locate the attending and reach out via [email or web address]. Between 7PM-7AM, please contact night-coverage If you still have difficulty reaching the attending provider, please page the [facility] (Director on Call) for [facility] on amion for assistance.

## 2020-09-05 NOTE — TOC Progression Note (Signed)
Transition of Care (TOC) - Progression Note    Patient Details  Name: [name] HENSEN Sr. MRN: [id number] Date of Birth: 1951-03-05  Transition of Care [facility]) CM/SW Contact  Rebecka Oelkers, Marjie Skiff, RN Phone Number: 09/05/2020, 1:52 PM  Clinical Narrative:    Pt transferred to [place] and new to this CM.  Pt current IVC done 12/21 and will need renewed on 12/28. Last psych eval done on 12/2 with recommendations for Inpt Psych placement. New psych eval needed to determine need for continued IVC and continued need for inpt psych placement.  New psych eval ordered by MD. Donella Stade will continue to follow.   Expected Discharge Plan: Psychiatric Hospital Barriers to Discharge: Psych Bed not available  Expected Discharge Plan and Services Expected Discharge Plan: Psychiatric Hospital In-house Referral: Clinical Social Work Discharge Planning Services: CM Consult Post Acute Care Choice: [place] arrangements for the past 2 months: Single Family Home                    Social Determinants of Health (SDOH) Interventions    Readmission Risk Interventions Readmission Risk Prevention Plan 02/18/2019  Transportation Screening Complete  PCP or Specialist Appt within 3-5 Days Complete  HRI or [place] Complete  Social Work Consult for [place] Planning/Counseling Complete  Palliative Care Screening Not Applicable  Medication Review [employer] photographer) Complete  Some recent data might be hidden

## 2020-09-05 NOTE — Consult Note (Addendum)
[facility] Consult   Reason for Consult: agitation Referring Physician: Dr. Ernestina Penna Patient Identification: [name] Flesher Sr. MRN:  [id number] Principal Diagnosis: <principal problem not specified> Diagnosis:  Active Problems:   Major depression, recurrent, chronic (HCC)   Essential hypertension   GAD (generalized anxiety disorder)   Dementia with behavioral disturbance (HCC)   Atrial fibrillation with rapid ventricular response (HCC)   Hypokalemia   AMS (altered mental status)   Acute delirium   Hypothyroidism   Vitamin B 12 deficiency   Total Time spent with patient: 30 minutes  Subjective:   [name] Flesher Sr. is a 69 y.o. male patient admitted with altered mental status.  Psych consult was placed for ongoing needs of IVC. Patient is seen and assessed by this nurse practitioner, he is observed to be sitting upright in bed fully dressed watching television. Patient does appear to be much more lucid, is now able to hold a conversation, and is alert and oriented to himself, location and time. He does exhibit some mild psychosis throughout the evaluation however returns back to normal logical thought processes. At 1 point he asked me to leave the room" you need to leave right now. You were taken money for me and so was this hospital. I know dating due to your check yesterday, but they're working up [employer]. But I will work on getting you your money. " Patient then returns back to having a normal conversation when the phone rings, and he has a conversation with his wife and continues with normal logical thought content and processes. He denies any immediate psychiatric concerns at this time. He denies any suicidal or homicidal ideations, he also denies any auditory or visual hallucinations.  HIs EKG obtained today shows QTc 511. At this time he is on Zyprexa 30m po QAM, Zyprexa 138mpo QHS, Depakote 750 mg po BID.     HPI:  [name] HEPPLERr. [age] a [age] with medical  history significant of a fib, dementia, depression. Presents with increasing agitation over last week. Hx is per wife at bedside. She reports that [name] HiClaireas had worsening dementia over the last 6 months. He has been following with his PCP about this issue. Different medications were tried -- including cymbalta, prozac, and abilify -- but nothing seems to have helped the decline. She reports that he has been found wandering the neighborhood at night with a flash light. He was [place] without announcing he was going. His family has had to take away his keys so he can't drive anywhere. He recently had a relative died, and it seems as if he decline has worsened. His wife is unable to take care of him at home at this point as she is also trying to take care of her mother with dementia. She is concerned about everyone's safety at this time, so she brought [name]. [name] the ED.   Past Psychiatric History: Dementia, previously taking mirtazapine and cymbalta.   Risk to Self:  UTA Risk to Others:  UTA Prior Inpatient Therapy:  UTA Prior Outpatient Therapy:  UTA  Past Medical History:  Past Medical History:  Diagnosis Date  . Adenomatous colon polyp 2016  . Allergy   . Anxiety   . Bell palsy    resolved  . Chicken pox   . Concussion with no loss of consciousness 07/07/2018  . Depression   . Diet-controlled diabetes mellitus ([place]  . Eating disorder   .  GERD (gastroesophageal reflux disease)   . History of vitamin D deficiency   . Hyperlipidemia   . Hypertension   . Hypokalemia   . Memory loss   . MI (myocardial infarction) ([place])    per pt   . Ulcerative colitis ([place]) 2005-2006   severe     Past Surgical History:  Procedure Laterality Date  . COLONOSCOPY WITH PROPOFOL N/A 07/15/2014   Procedure: COLONOSCOPY WITH PROPOFOL;  Surgeon: [name], MD;  Location: [facility];  Service: Endoscopy;  Laterality: N/A;  . COLONOSCOPY WITH PROPOFOL N/A 08/22/2015    Procedure: COLONOSCOPY WITH PROPOFOL;  Surgeon: [name], MD;  Location: [facility];  Service: Endoscopy;  Laterality: N/A;  . ORIF ANKLE FRACTURE  02/27/2012   Procedure: OPEN REDUCTION INTERNAL FIXATION (ORIF) ANKLE FRACTURE;  Surgeon: Colin Rhein, MD;  Location: [facility];  Service: Orthopedics;  Laterality: Left;  ORIF left bimalleolar ankle fracture  . SHOULDER SURGERY     LEFT  . SIGMOIDOSCOPY  10/08/2018   Externa and Internal Hmorrhoids. Tubular and Tublovillous adenoma removed from transverse colon. Inflammatory pseudopolyps, negative for dysplasia  . TONSILLECTOMY    . UPPER GASTROINTESTINAL ENDOSCOPY     Family History:  Family History  Problem Relation Age of Onset  . Dementia Mother   . Hypertension Mother   . Early death Father 108       drowning   . Post-traumatic stress disorder Brother   . Neuropathy Brother    Family Psychiatric  History: UTA Social History:  Social History   Substance and Sexual Activity  Alcohol Use Yes   Comment: occ     Social History   Substance and Sexual Activity  Drug Use No    Social History   Socioeconomic History  . Marital status: Married    Spouse name: [name]  . Number of children: 2  . Years of education: Not on file  . Highest education level: Some college, no degree  Occupational History  . Not on file  Tobacco Use  . Smoking status: Former Smoker    Packs/day: 1.00    Years: 19.00    Pack years: 19.00    Quit date: 02/25/1982    Years since quitting: 38.5  . Smokeless tobacco: Former Systems developer    Quit date: Biochemist, clinical  . Vaping Use: Never used  Substance and Sexual Activity  . Alcohol use: Yes    Comment: occ  . Drug use: No  . Sexual activity: Yes    Partners: Female  Other Topics Concern  . Not on file  Social History Narrative   Lives at home with wife [name].   College-educated. Works in Public house manager.    Social Determinants of Health   Financial  Resource Strain: Not on file  Food Insecurity: Not on file  Transportation Needs: Not on file  Physical Activity: Not on file  Stress: Not on file  Social Connections: Not on file   Additional Social History:    Allergies:   Allergies  Allergen Reactions  . Losartan Potassium-Hctz Diarrhea  . Nsaids     On eliquis.   Marland Kitchen Zoloft [Sertraline Hcl] Diarrhea  . Benzodiazepines Other (See Comments)    Agitation.    Labs:  Results for orders placed or performed during the hospital encounter of 07/13/20 (from the past 48 hour(s))  Glucose, capillary     Status: Abnormal   Collection Time: 09/03/20  5:38 PM  Result  Value Ref Range   Glucose-Capillary 105 (H) 70 - 99 mg/dL    Comment: Glucose reference range applies only to samples taken after fasting for at least 8 hours.   Comment 1 Notify RN    Comment 2 Document in Chart   Glucose, capillary     Status: None   Collection Time: 09/03/20 11:35 PM  Result Value Ref Range   Glucose-Capillary 89 70 - 99 mg/dL    Comment: Glucose reference range applies only to samples taken after fasting for at least 8 hours.  Glucose, capillary     Status: None   Collection Time: 09/04/20 12:22 PM  Result Value Ref Range   Glucose-Capillary 91 70 - 99 mg/dL    Comment: Glucose reference range applies only to samples taken after fasting for at least 8 hours.   Comment 1 Notify RN    Comment 2 Document in Chart   Glucose, capillary     Status: Abnormal   Collection Time: 09/04/20  5:05 PM  Result Value Ref Range   Glucose-Capillary 103 (H) 70 - 99 mg/dL    Comment: Glucose reference range applies only to samples taken after fasting for at least 8 hours.   Comment 1 Notify RN    Comment 2 Document in Chart   Glucose, capillary     Status: None   Collection Time: 09/04/20 11:01 PM  Result Value Ref Range   Glucose-Capillary 88 70 - 99 mg/dL    Comment: Glucose reference range applies only to samples taken after fasting for at least 8 hours.   Comprehensive metabolic panel     Status: Abnormal   Collection Time: 09/05/20  4:52 AM  Result Value Ref Range   Sodium 137 135 - 145 mmol/L   Potassium 3.4 (L) 3.5 - 5.1 mmol/L   Chloride 101 98 - 111 mmol/L   CO2 27 22 - 32 mmol/L   Glucose, Bld 92 70 - 99 mg/dL    Comment: Glucose reference range applies only to samples taken after fasting for at least 8 hours.   BUN 8 8 - 23 mg/dL   Creatinine, Ser 1.02 0.61 - 1.24 mg/dL   Calcium 8.4 (L) 8.9 - 10.3 mg/dL   Total Protein 5.1 (L) 6.5 - 8.1 g/dL   Albumin 2.7 (L) 3.5 - 5.0 g/dL   AST 21 15 - 41 U/L   ALT 10 0 - 44 U/L   Alkaline Phosphatase 51 38 - 126 U/L   Total Bilirubin 0.9 0.3 - 1.2 mg/dL   GFR, Estimated >60 >60 mL/min    Comment: (NOTE) Calculated using the CKD-EPI Creatinine Equation (2021)    Anion gap 9 5 - 15    Comment: Performed at [facility], [place] [address] [place], [place] [address]  Valproic acid level     Status: None   Collection Time: 09/05/20  4:52 AM  Result Value Ref Range   Valproic Acid Lvl 55 50.0 - 100.0 ug/mL    Comment: Performed at [facility], [place] [address], [place], [place] [address]  Glucose, capillary     Status: None   Collection Time: 09/05/20  7:39 AM  Result Value Ref Range   Glucose-Capillary 90 70 - 99 mg/dL    Comment: Glucose reference range applies only to samples taken after fasting for at least 8 hours.  Glucose, capillary     Status: Abnormal   Collection Time: 09/05/20 11:37 AM  Result Value Ref Range  Glucose-Capillary 138 (H) 70 - 99 mg/dL    Comment: Glucose reference range applies only to samples taken after fasting for at least 8 hours.    Current Facility-Administered Medications  Medication Dose Route Frequency Provider Last Rate Last Admin  . acetaminophen (TYLENOL) tablet 650 mg  650 mg Oral Q6H PRN Marylyn Ishihara, Tyrone A, DO   650 mg at 09/03/20 4196  . alum & mag hydroxide-simeth (MAALOX/MYLANTA) 200-200-20 MG/5ML suspension  30 mL  30 mL Oral Q4H PRN Lavina Hamman, MD   30 mL at 09/03/20 2029  . apixaban (ELIQUIS) tablet 5 mg  5 mg Oral BID Marylyn Ishihara, Tyrone A, DO   5 mg at 09/05/20 0947  . atorvastatin (LIPITOR) tablet 20 mg  20 mg Oral Daily Kyle, Tyrone A, DO   20 mg at 09/05/20 0947  . azaTHIOprine (IMURAN) tablet 200 mg  200 mg Oral Daily Cherene Altes, MD   200 mg at 09/05/20 0948  . diphenhydrAMINE (BENADRYL) 12.5 MG/5ML elixir 6.25 mg  6.25 mg Oral Q8H PRN Hosie Poisson, MD   6.25 mg at 09/02/20 1809  . diphenhydrAMINE-zinc acetate (BENADRYL) 2-0.1 % cream   Topical TID PRN Hosie Poisson, MD      . divalproex (DEPAKOTE SPRINKLE) capsule 750 mg  750 mg Oral Q12H Lavina Hamman, MD   750 mg at 09/05/20 0948  . DULoxetine (CYMBALTA) DR capsule 60 mg  60 mg Oral Daily Kyle, Tyrone A, DO   60 mg at 09/05/20 0948  . feeding supplement (ENSURE ENLIVE / ENSURE PLUS) liquid 237 mL  237 mL Oral BID BM Lavina Hamman, MD   237 mL at 09/05/20 1328  . haloperidol lactate (HALDOL) injection 5 mg  5 mg Intramuscular Q6H PRN Lavina Hamman, MD   5 mg at 09/03/20 1747  . levothyroxine (SYNTHROID) tablet 25 mcg  25 mcg Oral Q0600 Sharion Settler, NP   25 mcg at 09/05/20 0543  . melatonin tablet 3 mg  3 mg Oral QHS Hosie Poisson, MD   3 mg at 09/04/20 2253  . metoprolol tartrate (LOPRESSOR) tablet 25 mg  25 mg Oral BID Lavina Hamman, MD   25 mg at 09/05/20 0948  . nystatin (MYCOSTATIN/NYSTOP) topical powder   Topical TID Florencia Reasons, MD   Given at 09/05/20 [phone]  . OLANZapine (ZYPREXA) tablet 10 mg  10 mg Oral QHS Lavina Hamman, MD   10 mg at 09/04/20 2254  . OLANZapine (ZYPREXA) tablet 5 mg  5 mg Oral 2 times per day Lavina Hamman, MD   5 mg at 09/05/20 1035  . polyethylene glycol (MIRALAX / GLYCOLAX) packet 17 g  17 g Oral Daily Lavina Hamman, MD   17 g at 09/05/20 0948  . senna-docusate (Senokot-S) tablet 1 tablet  1 tablet Oral BID Lavina Hamman, MD   1 tablet at 09/05/20 0947  . sodium chloride (OCEAN) 0.65 %  nasal spray 1 spray  1 spray Each Nare PRN Cherene Altes, MD   1 spray at 09/02/20 1021  . tamsulosin (FLOMAX) capsule 0.4 mg  0.4 mg Oral Daily Shelly Coss, MD   0.4 mg at 09/05/20 0948  . vitamin B-12 (CYANOCOBALAMIN) tablet 1,000 mcg  1,000 mcg Oral Daily Bonnielee Haff, MD   1,000 mcg at 09/05/20 0948  . Vitamin D (Ergocalciferol) (DRISDOL) capsule 50,000 Units  50,000 Units Oral Q7 days Cherene Altes, MD   50,000 Units at 09/02/20 1020    Musculoskeletal:  Strength & Muscle Tone: within normal limits Gait & Station: normal Patient leans: N/A  Psychiatric Specialty Exam: Physical Exam   Review of Systems   Blood pressure 97/60, pulse 89, temperature 97.6 F (36.4 C), temperature source Oral, resp. rate 20, height 5' 7"  (1.702 m), weight 97.5 kg, SpO2 94 %.Body mass index is 33.67 kg/m.  General Appearance: Casual and Fairly Groomed  Eye Contact:  Fair  Speech:  Clear and Coherent and Normal Rate  Volume:  Normal  Mood:  Euthymic  Affect:  Appropriate and Congruent  Thought Process:  Linear and Descriptions of Associations: Circumstantial  Orientation:  Other:  A& O x 3  Thought Content:  Logical  Suicidal Thoughts:  No  Homicidal Thoughts:  No  Memory:  Immediate;   Fair Recent;   Poor Remote;   Poor  Judgement:  Poor  Insight:  Shallow  Psychomotor Activity:  Normal  Concentration:  Concentration: Poor and Attention Span: Poor  Recall:  Poor  Fund of Knowledge:  Poor  Language:  Fair  Akathisia:  No  Handed:  Right  AIMS (if indicated):     Assets:  Communication Skills Desire for Improvement Financial Resources/Insurance Leisure Time Physical Health Social Support  ADL's:  Intact  Cognition:  Impaired,  Mild  Sleep:       Treatment Plan Summary: Plan EKG obtained QTC over 500. Patient repeat Depakote levels have increased to 55.  -Depakote level obtained on December 27 was 55. Will continue Depakote 750 mg p.o. twice daily for treatment of  behavioral disturbances associated with dementia.  -We'll also continue olanzapine 5 mg p.o. every morning and olanzapine 7.5 mg p.o. nightly. Patient is no longer in restraints and is not showing any overt agitation, aggression, and or combativeness at the time of this evaluation. Although patient is exhibiting some ongoing psychosis, considering his degree of dementia and his overall progress since admission (54 days ) he is doing well on this dose.   -His current findings continue to suggest progression of his dementia, this is also discussed with his wife Camar Guyton who verbalizes understanding. We discussed the differences between dementia and Alzheimer's disease. Also discussed with wife patient's current progress, and how he has improved remarkably from a psychiatric standpoint. All comments questions and concerns were addressed with his wife. -At this time patient no longer appears to be an imminent risk to self or others.  -Patient to be psychiatrically cleared, no longer meets inpatient criteria at this time as stabilization has been obtained while inpatient.  -Recommend continue to work closely with social work to facilitate admission to skilled nursing facility or memory care unit for ongoing management of dementia with behavioral disturbances   -Patient with increased QTC of 511, previous recommendations have been made to refrain from use of haloperidol that can worsen his QTC. Patient has a order of Haldol IM 5 mg every 6 hours ordered, that will be discontinued.  Disposition: No evidence of imminent risk to self or others at present.   Patient does not meet criteria for psychiatric inpatient admission.  Suella Broad, FNP 09/05/2020 3:09 PM

## 2020-09-06 DIAGNOSIS — R410 Disorientation, unspecified: Secondary | ICD-10-CM | POA: Diagnosis not present

## 2020-09-06 LAB — GLUCOSE, CAPILLARY
Glucose-Capillary: 82 mg/dL (ref 70–99)
Glucose-Capillary: 133 mg/dL — ABNORMAL HIGH (ref 70–99)
Glucose-Capillary: 125 mg/dL — ABNORMAL HIGH (ref 70–99)
Glucose-Capillary: 125 mg/dL — ABNORMAL HIGH (ref 70–99)

## 2020-09-06 LAB — MAGNESIUM: Magnesium: 1.9 mg/dL (ref 1.7–2.4)

## 2020-09-06 LAB — BASIC METABOLIC PANEL
Anion gap: 10 (ref 5–15)
BUN: 7 mg/dL — ABNORMAL LOW (ref 8–23)
CO2: 29 mmol/L (ref 22–32)
Calcium: 8.8 mg/dL — ABNORMAL LOW (ref 8.9–10.3)
Chloride: 100 mmol/L (ref 98–111)
Creatinine, Ser: 1.05 mg/dL (ref 0.61–1.24)
GFR, Estimated: >60 mL/min (ref >60)
Glucose, Bld: 145 mg/dL — ABNORMAL HIGH (ref 70–99)
Potassium: 4 mmol/L (ref 3.5–5.1)
Sodium: 139 mmol/L (ref 135–145)

## 2020-09-06 MED ORDER — POTASSIUM CHLORIDE CRYS ER 10 MEQ PO TBCR
10.0000 meq | EXTENDED_RELEASE_TABLET | Freq: Every day | ORAL | Status: DC
  Administered 2020-09-07 – 2020-09-09 (×3): 10 meq via ORAL
  Filled 2020-09-06 (×3): qty 1

## 2020-09-06 MED ORDER — MAGNESIUM OXIDE 400 (241.3 MG) MG PO TABS
400.0000 mg | ORAL_TABLET | Freq: Every day | ORAL | Status: DC
  Administered 2020-09-07 – 2020-09-09 (×3): 400 mg via ORAL
  Filled 2020-09-06 (×3): qty 1

## 2020-09-06 NOTE — Progress Notes (Signed)
[facility] Progress Note  Patient: [name]    UVO:[id number]  DOA: 07/13/2020     Date of Service: the patient was seen and examined on 09/06/2020  Brief hospital course: Past medical history of A. fib, dementia, depression, anxiety, hypothyroidism. 07/13/2020 presented with confusion and agitation. 11/8//2021 benzodiazepine discontinued. 11/24 2021 Tegretol was started. Psychiatry currently recommends inpatient Geri psych placement given his ongoing confusion and agitation. Currently IVC on 08/30/2020. 12/27 psychiatry cleared the patient currently does not require geriatric inpatient psych facility.  Patient still will need a locked unit. Medically stable.  Assessment and Plan: 1. Acute delirium superimposed on progressive chronic dementia Initially precipitated by recent death in the family about 2 years ago. Head CT done ,negative for any acute abnormalities.  Unable to do MRI due to lack of cooperation. no focal neurological symptoms, therefore will not change management. Metabolic work-up including alcohol, ammonia, TSH, thiamine, cortisol, RPR unremarkable. B12 was relatively lower, replaced. Psychiatry has been consulted. Medical management initiated. Benzodiazepines were discontinued. Patient was also started on Tegretol which was discontinued and switched to Depakote. Seroquel was initiated but this was discontinued due to concern for QT prolongation. Depakote was increased and Zyprexa was added instead of Seroquel. Based on the Depakote level Depakote dose was modified. Zyprexa dose increased on 12/23. Change to 3 times daily regimen after discussion with the pharmacy. As needed restraints. Psychiatry recommending inpatient psychiatric admission inpatient admission. Patient currently medically stable for discharge.  2. Chronic atrial fibrillation Essential hypertension Eliquis for anticoagulation. Blood pressure has been soft therefore medication dose  has been adjusted. Was on On Lopressor for rate control.  Outpatient follow-up with cardiology for further discussions on ongoing anticoagulation due to history of progressive worsening dementia.  3. Prolonged QTC-resolved Last QT on 12/22 466, improved.  QTC on 12/27 again 511 likely associated with hypokalemia. After correcting potassium repeat QTC is 490 on 12/28. We will add daily low-dose potassium.  4. Type 2 diabetes mellitus well controlled Hemoglobin A1c 6.9 as per 06/30/2020.  Was on sliding scale insulin. Currently discontinued as the patient's sugars are well controlled.  5. Hypothyroidism On Synthroid.   6. Hypertension Blood pressure soft. Continue home medications. Was on metoprolol and Norvasc  7. Depression/anxiety Continue current medications. Psychiatry had been following. On mood stabilizer. Depakote level now adequate.  750 twice daily.  Continue the dose. Per psychiatry now stable enough although patient remains hallucinating and confused. Will need locked SNF.  8. Vitamin B12 deficiency Continue vitamin B12 supplementation.   9. Vitamin D deficiency Continue Vitamin D supplementation.   10. History of ulcerative colitis Continue Imuran. Needs outpatient follow-up with GI.   11. Urine retention Needing in and out intermittently . Continue Flomax  12.  Obesity. Placing the patient at high risk for poor outcomes. Outpatient consultation recommended. Body mass index is 33.67 kg/m.  Nutrition Problem: Inadequate oral intake Etiology: poor appetite (AMS) Interventions: Interventions: Ensure Enlive (each supplement provides 350kcal and 20 grams of protein)      Diet: regular diet DVT Prophylaxis:  apixaban (ELIQUIS) tablet 5 mg    Advance goals of care discussion: DNR  Family Communication: no family was present at bedside, at the time of interview.   Disposition:  Status is: Inpatient  Remains inpatient  appropriate because:Altered mental status and Unsafe d/c plan Will need memory care unit.  Or locked of SNF.  Dispo:  Patient From: Home  Planned Disposition: Memory care unit  Expected discharge date: 09/08/2020  Medically stable for discharge: Yes  Subjective: Last night had some agitation.  Currently so far no issues.  No nausea no vomiting.  Physical Exam:  General: Appear in mild distress, no Rash;  Cardiovascular: S1 and S2 Present, no Murmur, Respiratory: good respiratory effort, Bilateral Air entry present and CTA, no Crackles, no wheezes Abdomen: Bowel Sound present,  Neurology: alert Vitals:   09/05/20 2019 09/06/20 0439 09/06/20 1409 09/06/20 2012  BP: (!) 143/79 108/70 113/77 120/75  Pulse: (!) 102 77 67 96  Resp: 20 20 16 18   Temp: 97.9 F (36.6 C) 98.2 F (36.8 C)  97.6 F (36.4 C)  TempSrc: Oral Oral  Oral  SpO2: 97% 96% 95% 93%  Weight:      Height:       No intake or output data in the 24 hours ending 09/06/20 2056 Filed Weights   07/20/20 0922 08/21/20 0411 08/22/20 0550  Weight: 115.2 kg 97.2 kg 97.5 kg    Data Reviewed: I have personally reviewed and interpreted daily labs, tele strips, imagings as discussed above. I reviewed all nursing notes, pharmacy notes, vitals, pertinent old records I have discussed plan of care as described above with RN and patient/family.  CBC: No results for input(s): WBC, NEUTROABS, HGB, HCT, MCV, PLT in the last 168 hours. Basic Metabolic Panel: Recent Labs  Lab 08/31/20 1132 09/05/20 0452 09/06/20 1109  NA 140 137 139  K 4.5 3.4* 4.0  CL 102 101 100  CO2 28 27 29   GLUCOSE 100* 92 145*  BUN 7* 8 7*  CREATININE 0.98 1.02 1.05  CALCIUM 8.9 8.4* 8.8*  MG  --   --  1.9    Studies: No results found.  Scheduled Meds: . apixaban  5 mg Oral BID  . atorvastatin  20 mg Oral Daily  . azaTHIOprine  200 mg Oral Daily  . divalproex  750 mg Oral Q12H  . DULoxetine  60 mg Oral Daily  . feeding supplement  237 mL  Oral BID BM  . levothyroxine  25 mcg Oral Q0600  . melatonin  3 mg Oral QHS  . nystatin   Topical TID  . OLANZapine  10 mg Oral QHS  . OLANZapine  5 mg Oral 2 times per day  . polyethylene glycol  17 g Oral Daily  . senna-docusate  1 tablet Oral BID  . tamsulosin  0.4 mg Oral Daily  . vitamin B-12  1,000 mcg Oral Daily  . Vitamin D (Ergocalciferol)  50,000 Units Oral Q7 days   Continuous Infusions: PRN Meds: acetaminophen **OR** [DISCONTINUED] acetaminophen, alum & mag hydroxide-simeth, diphenhydrAMINE, diphenhydrAMINE-zinc acetate, sodium chloride  Time spent: 35 minutes  Author: Berle Mull, MD [facility] 09/06/2020 8:56 PM  To reach On-call, see care teams to locate the attending and reach out via [email or web address]. Between 7PM-7AM, please contact night-coverage If you still have difficulty reaching the attending provider, please page the [facility] (Director on Call) for [facility] on amion for assistance.

## 2020-09-06 NOTE — Progress Notes (Signed)
Physical Therapy Treatment Patient Details Name: [name] LEAMING Sr. MRN: [id number] DOB: 1950-12-07 Today's Date: 09/06/2020    History of Present Illness 69 year old gentleman prior history of atrial fibrillation on anticoagulation, dementia and depression, admitted for worsening AMS.    PT Comments    Patient making steady progress with acute PT and increased gait distance today. He requires Min assist with HHA to mobilize short distance in room and Min guard with intermittent assist for walker management while ambulating in hallway. Patient continues to c/o Rt knee pain with mobility and took 2 standing rest breaks with seated rest during gait due to fatigue. He will continue to benefit from skilled Pt interventions to progress safety with mobility. At EOS pt left in recliner with sitter, ice provided for bil knee pain.    Follow Up Recommendations  SNF     Equipment Recommendations  None recommended by PT    Recommendations for Other Services       Precautions / Restrictions Precautions Precautions: Fall Restrictions Weight Bearing Restrictions: No    Mobility  Bed Mobility Overal bed mobility: Needs Assistance Bed Mobility: Supine to Sit     Supine to sit: Supervision;Min guard     General bed mobility comments: supervision for safety, pt taking extra time. tactile cues to reach UE to bed rail and assist with raising trunk upright.  Transfers Overall transfer level: Needs assistance Equipment used: Rolling walker (2 wheeled);None Transfers: Sit to/from Stand Sit to Stand: Min assist         General transfer comment: cues for safe hand placement and to steady with rise from EOB and toilet.  Ambulation/Gait Ambulation/Gait assistance: Min assist;Min guard Gait Distance (Feet): 140 Feet (2x70, short bout in room to go to bathroom) Assistive device: Rolling walker (2 wheeled);1 person hand held assist Gait Pattern/deviations: Step-through pattern;Decreased step  length - right;Decreased step length - left;Decreased stride length;Trunk flexed Gait velocity: decr   General Gait Details: pt stopping 2x during each 70' bout to rest due to Rt knee pain. cues for safe proximity to RW and to stand upright when walking. chair follow for safety. min assist to manage walker and steady with intermittent gaurd. 1HHA to ambulate from bathroom back to recliner, pt required greater assist compared to ambulating with RW.   Stairs             Wheelchair Mobility    Modified Rankin (Stroke Patients Only)       Balance Overall balance assessment: Needs assistance;History of Falls Sitting-balance support: Feet supported;No upper extremity supported Sitting balance-Leahy Scale: Good     Standing balance support: During functional activity;Bilateral upper extremity supported Standing balance-Leahy Scale: Fair Standing balance comment: static stand to wash hands at sink                            Cognition Arousal/Alertness: Awake/alert (pt sleeping at start of sessio but easily alerted) Behavior During Therapy: [facility] for tasks assessed/performed Overall Cognitive Status: No family/caregiver present to determine baseline cognitive functioning Area of Impairment: Safety/judgement;Awareness;Problem solving                         Safety/Judgement: Decreased awareness of safety Awareness: Emergent Problem Solving: Requires verbal cues General Comments: pt maintained appropriate conversation 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      Exercises      General Comments  Pertinent Vitals/Pain Pain Assessment: Faces Faces Pain Scale: Hurts a little bit Pain Location: right knee Pain Descriptors / Indicators: Aching;Discomfort;Guarding Pain Intervention(s): Limited activity within patient's tolerance;Monitored during session;Repositioned;Ice applied    Home Living                       Prior Function            PT Goals (current goals can now be found in the care plan section) Acute Rehab PT Goals PT Goal Formulation: Patient unable to participate in goal setting Time For Goal Achievement: 09/14/20 Potential to Achieve Goals: Good Progress towards PT goals: Progressing toward goals    Frequency    Min 2X/week      PT Plan Current plan remains appropriate    Co-evaluation              AM-PAC PT "6 Clicks" Mobility   Outcome Measure  Help needed turning from your back to your side while in a flat bed without using bedrails?: A Little Help needed moving from lying on your back to sitting on the side of a flat bed without using bedrails?: A Little Help needed moving to and from a bed to a chair (including a wheelchair)?: A Little Help needed standing up from a chair using your arms (e.g., wheelchair or bedside chair)?: A Little Help needed to walk in hospital room?: A Little Help needed climbing 3-5 steps with a railing? : A Lot 6 Click Score: 17    End of Session Equipment Utilized During Treatment: Gait belt Activity Tolerance: Patient tolerated treatment well Patient left: in chair;with call bell/phone within reach;with nursing/sitter in room Nurse Communication: Mobility status PT Visit Diagnosis: Unsteadiness on feet (R26.81);Difficulty in walking, not elsewhere classified (R26.2)     Time: 3254-9826 PT Time Calculation (min) (ACUTE ONLY): 23 min  Charges:  $Gait Training: 8-22 mins $Therapeutic Activity: 8-22 mins                     Verner Mould, DPT Acute Rehabilitation Services Office [phone] Pager [phone]     [name] 09/06/2020, 2:30 PM

## 2020-09-07 DIAGNOSIS — R410 Disorientation, unspecified: Secondary | ICD-10-CM | POA: Diagnosis not present

## 2020-09-07 LAB — GLUCOSE, CAPILLARY
Glucose-Capillary: 82 mg/dL (ref 70–99)
Glucose-Capillary: 120 mg/dL — ABNORMAL HIGH (ref 70–99)
Glucose-Capillary: 135 mg/dL — ABNORMAL HIGH (ref 70–99)
Glucose-Capillary: 147 mg/dL — ABNORMAL HIGH (ref 70–99)

## 2020-09-07 NOTE — Progress Notes (Signed)
Physical Therapy Treatment Patient Details Name: [name]. MRN: [id number] DOB: [birth year] Today's Date: 09/07/2020    History of Present Illness 69 year old gentleman prior history of atrial fibrillation on anticoagulation, dementia and depression, admitted for worsening AMS.    PT Comments    Patient pleasant and agreeable to mobilize and exercise this session. Patient educated on rest breaks during gait to prevent over fatigue and manage through knee pain. Functional exercises for LE strengthening performed with min guard for safety during repeated sit<>stands. He will continue to benefit from skilled PT interventions to progress mobility. Will continue to progress as able.   Follow Up Recommendations  SNF     Equipment Recommendations  None recommended by PT    Recommendations for Other Services       Precautions / Restrictions Precautions Precautions: Fall Restrictions Weight Bearing Restrictions: No    Mobility  Bed Mobility               General bed mobility comments: pt OOB in chair at start of session.  Transfers Overall transfer level: Needs assistance Equipment used: Rolling walker (2 wheeled);None Transfers: Sit to/from Stand Sit to Stand: Min assist         General transfer comment: cues for safe hand placement and to steady with rise from EOB and toilet.  Ambulation/Gait Ambulation/Gait assistance: Min assist;Min guard Gait Distance (Feet): 110 Feet Assistive device: Rolling walker (2 wheeled);1 person hand held assist Gait Pattern/deviations: Step-through pattern;Decreased step length - right;Decreased step length - left;Decreased stride length;Trunk flexed Gait velocity: decr   General Gait Details: pt stopped 4x throughout for standing break due to fatigue, and knee pain. Pt resting forearms on RW. cues for pursed lip breathing. Min assist required intermittently to steady and manage RW.   Stairs             Wheelchair  Mobility    Modified Rankin (Stroke Patients Only)       Balance Overall balance assessment: Needs assistance;History of Falls Sitting-balance support: Feet supported;No upper extremity supported Sitting balance-Leahy Scale: Good     Standing balance support: During functional activity;Bilateral upper extremity supported Standing balance-Leahy Scale: Fair                              Cognition Arousal/Alertness: Awake/alert Behavior During Therapy: WFL for tasks assessed/performed Overall Cognitive Status: Impaired/Different from baseline Area of Impairment: Safety/judgement;Awareness;Problem solving                         Safety/Judgement: Decreased awareness of safety Awareness: Emergent Problem Solving: Requires verbal cues General Comments: pt maintained confused throughout session with illogical responses to questions and tangental conversation. pt pleasant throughout.      Exercises Other Exercises Other Exercises: 10 reps sit<>stand from recliner. Pt not using UE's on first 5 then began to push off arm rest for power up despite cues to not se UEs.    General Comments        Pertinent Vitals/Pain Pain Assessment: Faces Faces Pain Scale: Hurts a little bit Pain Location: right knee Pain Descriptors / Indicators: Aching;Discomfort;Guarding Pain Intervention(s): Limited activity within patient's tolerance;Monitored during session;Repositioned    Home Living                      Prior Function            PT Goals (current goals  can now be found in the care plan section) Acute Rehab PT Goals PT Goal Formulation: Patient unable to participate in goal setting Time For Goal Achievement: 09/14/20 Potential to Achieve Goals: Good Progress towards PT goals: Progressing toward goals    Frequency    Min 2X/week      PT Plan Current plan remains appropriate    Co-evaluation              AM-PAC PT "6 Clicks" Mobility    Outcome Measure  Help needed turning from your back to your side while in a flat bed without using bedrails?: A Little Help needed moving from lying on your back to sitting on the side of a flat bed without using bedrails?: A Little Help needed moving to and from a bed to a chair (including a wheelchair)?: A Little Help needed standing up from a chair using your arms (e.g., wheelchair or bedside chair)?: A Little Help needed to walk in hospital room?: A Little Help needed climbing 3-5 steps with a railing? : A Lot 6 Click Score: 17    End of Session Equipment Utilized During Treatment: Gait belt Activity Tolerance: Patient tolerated treatment well Patient left: in chair;with call bell/phone within reach;with nursing/sitter in room Nurse Communication: Mobility status PT Visit Diagnosis: Unsteadiness on feet (R26.81);Difficulty in walking, not elsewhere classified (R26.2)     Time: 1660-6301 PT Time Calculation (min) (ACUTE ONLY): 18 min  Charges:  $Therapeutic Exercise: 8-22 mins                     Verner Mould, DPT Acute Rehabilitation Services Office [phone] Pager [phone]     [name] 09/07/2020, 6:34 PM

## 2020-09-07 NOTE — Progress Notes (Signed)
[facility] Progress Note  Patient: [name]    IWL:[id number]  DOA: 07/13/2020     Date of Service: the patient was seen and examined on 09/07/2020  Brief hospital course: Past medical history of A. fib, dementia, depression, anxiety, hypothyroidism. 07/13/2020 presented with confusion and agitation. 11/8//2021 benzodiazepine discontinued. 11/24 2021 Tegretol was started. Psychiatry currently recommends inpatient Geri psych placement given his ongoing confusion and agitation. Currently IVC on 08/30/2020. 12/27 psychiatry cleared the patient currently does not require geriatric inpatient psych facility.  Patient still will need a locked unit. Medically stable.  Assessment and Plan: 1. Acute delirium superimposed on progressive chronic dementia Initially precipitated by recent death in the family about 2 years ago. Head CT done ,negative for any acute abnormalities.  Unable to do MRI due to lack of cooperation. no focal neurological symptoms, therefore will not change management. Metabolic- alcohol, ammonia, TSH, thiamine, cortisol, RPR unremarkable. B12 was relatively lower, replaced. Medical management initiated. Benzodiazepines were discontinued-- Tegretol -- discontinued and switched to Depakote. Seroquel  was discontinued 2/2 QT prolongation---Zyprexa was added instead --Zyprexa dose increased on 12/23. Change to 3 times daily regimen after discussion with the pharmacy. Depakote was increased and Based on the Depakote level Depakote dose was modified. Psychiatry most recently evaluated 12/27 and not recommending any further inpatient psychiatric facility Patient currently medically stable for discharge--- will need either a locked SNF or Geri psych unit   2. Chronic atrial fibrillation Essential hypertension Eliquis for anticoagulation. Blood pressure has been soft therefore medication dose has been adjusted. Was on On Lopressor for rate control.  Outpatient  follow-up with cardiology for any anticoagulation 2/2 dementia  3. Prolonged QTC-resolved Last QT on 12/22 466, improved.  QTC on 12/27 again 511 likely associated with hypokalemia. After correcting potassium repeat QTC is 490 on 12/28. We will add daily low-dose potassium. Rechecking labs periodically  4. Type 2 diabetes mellitus well controlled Hemoglobin A1c 6.9 as per 06/30/2020.  Was on sliding scale insulin.--discontinued as the patient's sugars are well controlled.  5. Hypothyroidism On Synthroid.   6. Hypertension Blood pressure soft. Continue home medications. Was on metoprolol and Norvasc  7. Depression/anxiety Continue current medications. Psychiatry had been following. On mood stabilizer. Depakote level now adequate.  750 twice daily.  Continue the dose. Per psychiatry now stable enough although patient remains hallucinating and confused. Will need locked SNF.  8. Vitamin B12 deficiency Continue vitamin B12 supplementation.   9. Vitamin D deficiency Continue Vitamin D supplementation.   10. History of ulcerative colitis Continue Imuran. Needs outpatient follow-up with GI.   11. Urine retention Needing in and out intermittently . Continue Flomax  12.  Obesity. Placing the patient at high risk for poor outcomes. Outpatient consultation recommended. Body mass index is 33.67 kg/m.  Nutrition Problem: Inadequate oral intake Etiology: poor appetite (AMS) Interventions: Interventions: Ensure Enlive (each supplement provides 350kcal and 20 grams of protein)      Diet: regular diet DVT Prophylaxis:  apixaban (ELIQUIS) tablet 5 mg    Advance goals of care discussion: DNR  Family Communication: no family was present at bedside, at the time of interview.   Disposition:  Status is: Inpatient  Remains inpatient appropriate because:Altered mental status and Unsafe d/c plan Will need memory care unit.  Or locked of  SNF.  Dispo:  Patient From: Home  Planned Disposition: Memory care unit  Expected discharge date: 09/09/2020  Medically stable for discharge: Yes  Subjective:  Main complaint is in fingers  No chest pain no fever Somewhat agitated at times  Physical Exam:  General: Appear in mild distress, no Rash Verbalizes well but seems to have some auditory hallucinations at time and seems to arouse easily Cardiovascular: S1 and S2 Present, no Murmur, Respiratory: good respiratory effort, Bilateral Air entry present and CTA, no Crackles wheeze or rhonchi Abdomen: Bowel Sound present,   Neurology: alert Vitals:   09/06/20 1409 09/06/20 2012 09/07/20 0628 09/07/20 0629  BP: 113/77 120/75 (!) 86/50 96/68  Pulse: 67 96 97 94  Resp: 16 18 18    Temp:  97.6 F (36.4 C) 98.1 F (36.7 C)   TempSrc:  Oral    SpO2: 95% 93% 96% 96%  Weight:      Height:       No intake or output data in the 24 hours ending 09/07/20 1619 Filed Weights   07/20/20 0922 08/21/20 0411 08/22/20 0550  Weight: 115.2 kg 97.2 kg 97.5 kg    Data Reviewed: I have personally reviewed and interpreted daily labs, tele strips, imagings as discussed above. I reviewed all nursing notes, pharmacy notes, vitals, pertinent old records I have discussed plan of care as described above with RN and patient/family.  CBC: No results for input(s): WBC, NEUTROABS, HGB, HCT, MCV, PLT in the last 168 hours. Basic Metabolic Panel: Recent Labs  Lab 09/05/20 0452 09/06/20 1109  NA 137 139  K 3.4* 4.0  CL 101 100  CO2 27 29  GLUCOSE 92 145*  BUN 8 7*  CREATININE 1.02 1.05  CALCIUM 8.4* 8.8*  MG  --  1.9    Studies: No results found.  Scheduled Meds: . apixaban  5 mg Oral BID  . atorvastatin  20 mg Oral Daily  . azaTHIOprine  200 mg Oral Daily  . divalproex  750 mg Oral Q12H  . DULoxetine  60 mg Oral Daily  . feeding supplement  237 mL Oral BID BM  . levothyroxine  25 mcg Oral Q0600  . magnesium oxide  400 mg Oral Daily   . melatonin  3 mg Oral QHS  . nystatin   Topical TID  . OLANZapine  10 mg Oral QHS  . OLANZapine  5 mg Oral 2 times per day  . polyethylene glycol  17 g Oral Daily  . potassium chloride  10 mEq Oral Daily  . senna-docusate  1 tablet Oral BID  . tamsulosin  0.4 mg Oral Daily  . vitamin B-12  1,000 mcg Oral Daily  . Vitamin D (Ergocalciferol)  50,000 Units Oral Q7 days   Continuous Infusions: PRN Meds: acetaminophen **OR** [DISCONTINUED] acetaminophen, alum & mag hydroxide-simeth, diphenhydrAMINE, diphenhydrAMINE-zinc acetate, sodium chloride  Time spent: 15 minutes  Author: Berle Mull, MD [facility] 09/07/2020 4:19 PM  To reach On-call, see care teams to locate the attending and reach out via [email or web address]. Between 7PM-7AM, please contact night-coverage If you still have difficulty reaching the attending provider, please page the [facility] (Director on Call) for [facility] on amion for assistance.

## 2020-09-08 DIAGNOSIS — R410 Disorientation, unspecified: Secondary | ICD-10-CM | POA: Diagnosis not present

## 2020-09-08 LAB — GLUCOSE, CAPILLARY
Glucose-Capillary: 81 mg/dL (ref 70–99)
Glucose-Capillary: 110 mg/dL — ABNORMAL HIGH (ref 70–99)
Glucose-Capillary: 116 mg/dL — ABNORMAL HIGH (ref 70–99)
Glucose-Capillary: 153 mg/dL — ABNORMAL HIGH (ref 70–99)

## 2020-09-08 NOTE — Progress Notes (Signed)
Nutrition Follow-up  DOCUMENTATION CODES:   Obesity unspecified  INTERVENTION:   -Ensure Enlive po BID, each supplement provides 350 kcal and 20 grams of protein  -Magic cup TID with meals, each supplement provides 290 kcal and 9 grams of protein  NUTRITION DIAGNOSIS:   Inadequate oral intake related to poor appetite (AMS) as evidenced by meal completion < 50%.  Ongoing.  GOAL:   Patient will meet greater than or equal to 90% of their needs  Progressing.  MONITOR:   PO intake,Supplement acceptance,Labs,Weight trends,I & O's  ASSESSMENT:   69 year old gentleman prior history of atrial fibrillation on anticoagulation, dementia and depression brought to ED for increasing agitation.  As per the family ,he has been progressively having worsening dementia since last 2 years. Multiple medications have been attempted in the outpatient setting without any improvement.  His initial CT of the head on admission was negative for acute findings.Hospital course remarkable for persistent agitation requiring soft restraints.  Patient currently consuming 75-100% of meals. Pt has been accepting Ensure supplements.  Per MD note, pt is medically stable. Plan is for SNF or geri-psych unit at discharge.  Admission weight: 253 lbs.  Last recorded weight 12/13: 214 lbs.  Medications: MAG-OX, Miralax, KLOR-CON, Senokot, Vitamin B-12   Labs reviewed: CBGs: 81-147  Diet Order:   Diet Order            Diet regular Room service appropriate? Yes; Fluid consistency: Thin  Diet effective now                 EDUCATION NEEDS:   No education needs have been identified at this time  Skin:  Skin Assessment: Reviewed RN Assessment  Last BM:  12/28  Height:   Ht Readings from Last 1 Encounters:  07/20/20 5' 7"  (1.702 m)    Weight:   Wt Readings from Last 1 Encounters:  08/22/20 97.5 kg    BMI:  Body mass index is 33.67 kg/m.  Estimated Nutritional Needs:   Kcal:   1950-2150  Protein:  100-110g  Fluid:  2L/day  Clayton Bibles, MS, RD, LDN Inpatient Clinical Dietitian Contact information available via Amion

## 2020-09-08 NOTE — Progress Notes (Signed)
Physical Therapy Treatment Patient Details Name: [name] KNUTZEN Sr. MRN: [id number] DOB: 1951/07/14 Today's Date: 09/08/2020    History of Present Illness 69 year old gentleman prior history of atrial fibrillation on anticoagulation, dementia and depression, admitted for worsening AMS.    PT Comments    Patient continues to be pleasant and agreeable to mobilize with acute therapy. He continues to require min assist for intermittent management of RW, especially on turns. Pt has tendency to step outside of walker when turning. Continued functional sit<>stands for LE strengthening and pt completed heel raises as well with no c/o increased knee pain with exercises this date. He will continue to benefit from skilled PT interventions to continue progressing independence with mobility. Will progress as able during acute stay.   Follow Up Recommendations  SNF     Equipment Recommendations  None recommended by PT    Recommendations for Other Services       Precautions / Restrictions Precautions Precautions: Fall Restrictions Weight Bearing Restrictions: No    Mobility  Bed Mobility               General bed mobility comments: pt OOB in chair at start of session.  Transfers Overall transfer level: Needs assistance Equipment used: Rolling walker (2 wheeled);None Transfers: Sit to/from Stand Sit to Stand: Min guard         General transfer comment: close guarding for safety with sit<>stand transfers. No overt LOB with rising.  Ambulation/Gait Ambulation/Gait assistance: Min assist;Min guard Gait Distance (Feet): 200 Feet Assistive device: Rolling walker (2 wheeled);1 person hand held assist Gait Pattern/deviations: Step-through pattern;Decreased step length - right;Decreased step length - left;Decreased stride length;Trunk flexed Gait velocity: decr   General Gait Details: pt taking 4 planned rest stops and markers (diamonds) on the floor to rest knes and reduce fatigue.  Pt reports decreased SOB with gait today. He required min assist for walker management during turns and has tendency to step outside of walker while turning.   Stairs             Wheelchair Mobility    Modified Rankin (Stroke Patients Only)       Balance Overall balance assessment: Needs assistance;History of Falls Sitting-balance support: Feet supported;No upper extremity supported Sitting balance-Leahy Scale: Good     Standing balance support: During functional activity;Bilateral upper extremity supported Standing balance-Leahy Scale: Fair                              Cognition Arousal/Alertness: Awake/alert Behavior During Therapy: WFL for tasks assessed/performed Overall Cognitive Status: Impaired/Different from baseline Area of Impairment: Safety/judgement;Awareness;Problem solving                         Safety/Judgement: Decreased awareness of safety Awareness: Emergent Problem Solving: Requires verbal cues General Comments: pt maintained confused throughout session with illogical responses to questions and tangental conversation. pt pleasant throughout.      Exercises Other Exercises Other Exercises: 10 reps sit<>stand from recliner. Pt not using UE's on first 5 then began to push off arm rest for power up despite cues to not se UEs. Other Exercises: 15 reps bil LE heel raises standing.    General Comments        Pertinent Vitals/Pain Pain Assessment: Faces Faces Pain Scale: Hurts a little bit Pain Location: right knee Pain Descriptors / Indicators: Aching;Discomfort;Guarding Pain Intervention(s): Limited activity within patient's tolerance;Monitored during session  Home Living                      Prior Function            PT Goals (current goals can now be found in the care plan section) Acute Rehab PT Goals PT Goal Formulation: Patient unable to participate in goal setting Time For Goal Achievement:  09/14/20 Potential to Achieve Goals: Good Progress towards PT goals: Progressing toward goals    Frequency    Min 2X/week      PT Plan Current plan remains appropriate    Co-evaluation       OT goals addressed during session: ADL's and self-care (functional mobility)      AM-PAC PT "6 Clicks" Mobility   Outcome Measure  Help needed turning from your back to your side while in a flat bed without using bedrails?: A Little Help needed moving from lying on your back to sitting on the side of a flat bed without using bedrails?: A Little Help needed moving to and from a bed to a chair (including a wheelchair)?: A Little Help needed standing up from a chair using your arms (e.g., wheelchair or bedside chair)?: A Little Help needed to walk in hospital room?: A Little Help needed climbing 3-5 steps with a railing? : A Lot 6 Click Score: 17    End of Session Equipment Utilized During Treatment: Gait belt Activity Tolerance: Patient tolerated treatment well Patient left: in chair;with call bell/phone within reach;with nursing/sitter in room Nurse Communication: Mobility status PT Visit Diagnosis: Unsteadiness on feet (R26.81);Difficulty in walking, not elsewhere classified (R26.2)     Time: 4332-9518 PT Time Calculation (min) (ACUTE ONLY): 21 min  Charges:  $Therapeutic Exercise: 8-22 mins                     Verner Mould, DPT Acute Rehabilitation Services Office [phone] Pager [phone]     [name] 09/08/2020, 1:48 PM

## 2020-09-08 NOTE — Progress Notes (Signed)
Seen with son in room  Well no distress Tangential No new issues really  BP 139/86 (BP Location: Right Arm)   Pulse 60   Temp 98.3 F (36.8 C) (Oral)   Resp 20   Ht 5' 7"  (1.702 m)   Wt 97.5 kg   SpO2 97%   BMI 33.67 kg/m  eomi ncat no focal deficit s1 s2 no m/r/g   Long discussion with son/patient wife re-dispo--will need to coordinate as bed availability not confirmed, may need to plan for alternatives  See TOC notes for input   Verneita Griffes, MD [facility] 3:54 PM

## 2020-09-08 NOTE — Progress Notes (Signed)
Occupational Therapy Treatment °Patient Details °Name: [name]. °MRN: [id number] °DOB: 09/09/1951 °Today's Date: 09/08/2020 ° ° ° °History of present illness 69-year-old gentleman prior history of atrial fibrillation on anticoagulation, dementia and depression, admitted for worsening AMS. °  °OT comments ° Mr. Dara Sandoz is sitting at side of bed when therapist entered the room. Patient is fully dressed and patient's son reports he has been dressing himself. Patient demonstrates ability to ambulate in room, into bathroom, back into room and around room without DME. Patient exhibits ability to lean over outside of base of support without loss of balance. Patient demonstrates ability to get in and out of bed and perform toilet transfer. Rn and son reports patient performing toileting with supervision. Patient ambulated in hall with RW x 50 feet but able to take hands off of walker and maintain balance. Patient demonstrates ability to perform functional mobility and BADLs. Patient has met OT goals. Patient needs 24/7 supervision for safely due to dementia. No further OT needs at this time.  °  °Follow Up Recommendations ° Supervision/Assistance - 24 hour  °  °Equipment Recommendations ° None recommended by OT  °  °Recommendations for Other Services   ° °  °Precautions / Restrictions Precautions °Precautions: Fall °Restrictions °Weight Bearing Restrictions: No  ° ° °  ° °Mobility Bed Mobility °Overal bed mobility: Independent °  °  °  °  °  °  °General bed mobility comments: pt OOB in chair at start of session. ° °Transfers °Overall transfer level: Modified independent °Equipment used: Rolling walker (2 wheeled);None °Transfers: Sit to/from Stand °Sit to Stand: Min guard °  °  °  °  °General transfer comment: Patient ambulating in room without device. No difficulties and supervision from therapist. AMbulated 50 feet in hallway with RW. ° °  °Balance Overall balance assessment: Mild deficits observed, not  formally tested °Sitting-balance support: Feet supported;No upper extremity supported °Sitting balance-Leahy Scale: Good °  °  °Standing balance support: During functional activity;Bilateral upper extremity supported °Standing balance-Leahy Scale: Fair °  °  °  °  °  °  °  °  °  °  °  °  °   ° °ADL either performed or assessed with clinical judgement  ° °ADL Overall ADL's : Needs assistance/impaired °  °  °  °  °  °  °  °  °  °  °Lower Body Dressing: Independent °Lower Body Dressing Details (indicate cue type and reason): Patient dressied in socks, shoes and pants. Patient's son reports patient able to dress himself. °Toilet Transfer: Independent °Toilet Transfer Details (indicate cue type and reason): Demonstrates ability to perform sit to stands from lower surface in order to perform toilet transfer. °Toileting- Clothing Manipulation and Hygiene: Independent °Toileting - Clothing Manipulation Details (indicate cue type and reason): Demonstrates balance and mobility in order to perform toileting. Son in room reports patient able to perform toileting without assistance. °  °  °  °  °  ° ° °Vision Patient Visual Report: No change from baseline °  °  °Perception   °  °Praxis   °  ° °Cognition Arousal/Alertness: Awake/alert °Behavior During Therapy: WFL for tasks assessed/performed °Overall Cognitive Status: History of cognitive impairments - at baseline °Area of Impairment: Safety/judgement;Awareness;Problem solving °  °  °  °  °  °  °  °  °  °  °  °  °Safety/Judgement: Decreased awareness of safety °Awareness: Emergent °Problem Solving:   Requires verbal cues °General Comments: pt maintained confused throughout session with illogical responses to questions and tangental conversation. pt pleasant throughout. °  °  °   °Exercises Exercises: Other exercises °Other Exercises °Other Exercises: 10 reps sit<>stand from recliner. Pt not using UE's on first 5 then began to push off arm rest for power up despite cues to not se  UEs. °Other Exercises: 15 reps bil LE heel raises standing. °  °Shoulder Instructions   ° ° °  °General Comments    ° ° °Pertinent Vitals/ Pain       Pain Assessment: Faces °Faces Pain Scale: Hurts a little bit °Pain Location: right knee °Pain Descriptors / Indicators: Aching;Discomfort °Pain Intervention(s): Monitored during session ° °Home Living   °  °  °  °  °  °  °  °  °  °  °  °  °  °  °  °  °  °  ° °  °Prior Functioning/Environment    °  °  °  °   ° °Frequency °    ° ° ° ° °  °Progress Toward Goals ° °OT Goals(current goals can now be found in the care plan section) °   ° °Acute Rehab OT Goals °OT Goal Formulation: All assessment and education complete, DC therapy  °Plan     ° °Co-evaluation ° ° °   °  °  °OT goals addressed during session: ADL's and self-care (functional mobility) °  ° °  °AM-PAC OT "6 Clicks" Daily Activity     °Outcome Measure ° ° Help from another person eating meals?: None °Help from another person taking care of personal grooming?: None °Help from another person toileting, which includes using toliet, bedpan, or urinal?: None °Help from another person bathing (including washing, rinsing, drying)?: None °Help from another person to put on and taking off regular upper body clothing?: None °Help from another person to put on and taking off regular lower body clothing?: None °6 Click Score: 24 ° °  °End of Session Equipment Utilized During Treatment: Rolling walker ° °OT Visit Diagnosis: Other abnormalities of gait and mobility (R26.89);Pain °Pain - Right/Left: Right °Pain - part of body: Knee °  °Activity Tolerance Patient tolerated treatment well °  °Patient Left in bed;with family/visitor present °  °Nurse Communication  (okay to see) °  ° °   ° °Time: 1334-1343 °OT Time Calculation (min): 9 min ° °Charges: OT General Charges °$OT Visit: 1 Visit °OT Treatments °$Therapeutic Activity: 8-22 mins ° °, OTR/L °Acute Care Rehab Services  °Office [phone] °Pager: [phone]   ° ° ° L  °09/08/2020, 4:56 PM ° ° ° °

## 2020-09-09 DIAGNOSIS — R410 Disorientation, unspecified: Secondary | ICD-10-CM | POA: Diagnosis not present

## 2020-09-09 MED ORDER — OLANZAPINE 5 MG PO TABS: 5.0000 mg | ORAL_TABLET | Freq: Two times a day (BID) | ORAL | 1 refills | Status: DC

## 2020-09-09 MED ORDER — DIVALPROEX SODIUM 125 MG PO CSDR: 750.0000 mg | DELAYED_RELEASE_CAPSULE | Freq: Two times a day (BID) | ORAL | 1 refills | Status: DC

## 2020-09-09 MED ORDER — OLANZAPINE 10 MG PO TABS: 10.0000 mg | ORAL_TABLET | Freq: Every day | ORAL | 1 refills | Status: DC

## 2020-09-09 MED ORDER — ZOLPIDEM TARTRATE ER 12.5 MG PO TBCR: 12.5000 mg | EXTENDED_RELEASE_TABLET | Freq: Every evening | ORAL | 2 refills | Status: DC | PRN

## 2020-09-09 MED ORDER — TAMSULOSIN HCL 0.4 MG PO CAPS: 0.4000 mg | ORAL_CAPSULE | Freq: Every day | ORAL | 1 refills | Status: DC

## 2020-09-09 MED ORDER — ALUM & MAG HYDROXIDE-SIMETH 200-200-20 MG/5ML PO SUSP: 30.0000 mL | ORAL | 0 refills | Status: DC | PRN

## 2020-09-09 MED ORDER — SITAGLIPTIN PHOSPHATE 25 MG PO TABS: 25.0000 mg | ORAL_TABLET | Freq: Every day | ORAL | 1 refills | Status: DC

## 2020-09-09 MED ORDER — ACETAMINOPHEN 325 MG PO TABS: 650.0000 mg | ORAL_TABLET | Freq: Four times a day (QID) | ORAL | 0 refills | Status: DC | PRN

## 2020-09-09 NOTE — Discharge Summary (Signed)
Physician Discharge Summary  [name] Sr. DSK:[id number] DOB: Dec 06, 1950 DOA: 07/13/2020  PCP: Howard Pouch A, DO  Admit date: 07/13/2020 Discharge date: 09/09/2020  Time spent: 47 minutes  Recommendations for Outpatient Follow-up:  1. New medications as per [facility] significant changes to psychotropics 2. Will need primary care follow-up and coordination with psychiatry in the outpatient setting 3. Please reevaluate in the outpatient setting need for metoprolol 4. Recommend  Chem-12, CBC 1 to 2 weeks, consider Depakote levels periodically 5. Recheck TSH in 1 month  Discharge Diagnoses:  Active Problems:   Major depression, recurrent, chronic (HCC)   Essential hypertension   GAD (generalized anxiety disorder)   Dementia with behavioral disturbance (HCC)   Atrial fibrillation with rapid ventricular response (HCC)   Hypokalemia   AMS (altered mental status)   Acute delirium   Hypothyroidism   Vitamin B 12 deficiency   Discharge Condition: Fair  Diet recommendation: Heart healthy  Filed Weights   07/20/20 0922 08/21/20 0411 08/22/20 0550  Weight: 115.2 kg 97.2 kg 97.5 kg    History of present illness:  63 white male with A. fib depression dementia hypothyroid presented 07/13/2020 agitation confusion seen by psychiatry of multiple medications were adjusted and patient was IVC 12/21-IVC was rescinded 12/27 as they did not feel he needed geriatric psychiatry facility Attempts were made to obtain [place] [facility] placement however this was not possible Patient was ultimately discharged home with family  Hospital Course:  Acute delirium superimposed on progressive dementia Bipolar disease with hallucinations in addition Inciting event death of family member several years prior CT was negative MRI unable to be accomplished Metabolic parameters ethanol and H3 TSH thiamine cortisol RPR B12   [-] Discharging home on medications as below At request of son placing on Ambien for help with  sleep Chronic atrial fibrillation Continue Eliquis relatively rate controlled off metoprolol Unfortunately cannot use the same because of mild hypotension at times Amlodipine was discontinued this admission in addition Reevaluate in the outpatient setting need for metoprolol Hypothyroidism Continue Synthroid at prior to admission dosing outpatient labs Ulcerative colitis Continue Imuran monitor trends B12 deficiency vitamin D deficiency In an effort to decrease polypharmacy might consider discontinuing supplements  Consultations:  Psychiatry  Discharge Exam: Vitals:   09/08/20 1315 09/08/20 2043  BP: 139/86 (!) 156/95  Pulse: 60 68  Resp: 20 16  Temp: 98.3 F (36.8 C) 97.9 F (36.6 C)  SpO2: 97% 95%    General: Awake alert tangential somewhat confused at times EOMI NCAT no focal deficit Cardiovascular: S1-S2 no murmur no rub no gallop seems to be in sinus Respiratory: Clear no added sound Neurologically intact moving 4 limbs equally power reasonable  Discharge Instructions   Discharge Instructions    Diet - low sodium heart healthy   Complete by: As directed    Discharge instructions   Complete by: As directed    Please seek follow-up with primary physician in the outpatient setting You have been prescribed bunch of psychotropic medications which we will prescribed for you and called into your pharmacy These ensure that you take them Some medications have been discontinued during this hospital stay and will need to be reconsidered on discharge   Increase activity slowly   Complete by: As directed      Allergies as of 09/09/2020      Reactions   Losartan Potassium-hctz Diarrhea   Nsaids    On eliquis.    Zoloft [sertraline Hcl] Diarrhea   Benzodiazepines Other (See  Comments)   Agitation.      Medication List    STOP taking these medications   amLODipine 10 MG tablet Commonly known as: NORVASC   ARIPiprazole 10 MG tablet Commonly known as: Abilify    dicyclomine 20 MG tablet Commonly known as: BENTYL   fluticasone 50 MCG/ACT nasal spray Commonly known as: FLONASE   ibuprofen 200 MG tablet Commonly known as: ADVIL   levocetirizine 5 MG tablet Commonly known as: XYZAL   loperamide 2 MG tablet Commonly known as: Anti-Diarrheal   LORazepam 0.5 MG tablet Commonly known as: ATIVAN   metoprolol tartrate 25 MG tablet Commonly known as: LOPRESSOR   mirtazapine 15 MG tablet Commonly known as: REMERON   pantoprazole 20 MG tablet Commonly known as: PROTONIX     TAKE these medications   acetaminophen 325 MG tablet Commonly known as: TYLENOL Take 2 tablets (650 mg total) by mouth every 6 (six) hours as needed for mild pain (or Fever >/= 101).   alum & mag hydroxide-simeth 200-200-20 MG/5ML suspension Commonly known as: MAALOX/MYLANTA Take 30 mLs by mouth every 4 (four) hours as needed for indigestion.   apixaban 5 MG Tabs tablet Commonly known as: ELIQUIS Take 1 tablet (5 mg total) by mouth 2 (two) times daily.   atorvastatin 20 MG tablet Commonly known as: LIPITOR Take 1 tablet (20 mg total) by mouth daily.   azaTHIOprine 50 MG tablet Commonly known as: IMURAN Take 200 mg by mouth every morning.   Cyanocobalamin 2500 MCG Tabs 1 tab daily po What changed:   how much to take  how to take this  when to take this   divalproex 125 MG capsule Commonly known as: DEPAKOTE SPRINKLE Take 6 capsules (750 mg total) by mouth every 12 (twelve) hours.   DULoxetine 60 MG capsule Commonly known as: CYMBALTA Take 1 capsule (60 mg total) by mouth daily.   levothyroxine 25 MCG tablet Commonly known as: Synthroid Take 1 tablet (25 mcg total) by mouth daily before breakfast.   OLANZapine 10 MG tablet Commonly known as: ZYPREXA Take 1 tablet (10 mg total) by mouth at bedtime.   OLANZapine 5 MG tablet Commonly known as: ZYPREXA Take 1 tablet (5 mg total) by mouth in the morning and at bedtime.   potassium chloride 10  MEQ tablet Commonly known as: Klor-Con M10 Take 1 tablet (10 mEq total) by mouth daily.   ROLAIDS PO Take 1 tablet by mouth daily as needed (upset stomach).   sitaGLIPtin 25 MG tablet Commonly known as: Januvia Take 1 tablet (25 mg total) by mouth daily.   tamsulosin 0.4 MG Caps capsule Commonly known as: FLOMAX Take 1 capsule (0.4 mg total) by mouth daily. Start taking on: September 10, 2020   zolpidem 12.5 MG CR tablet Commonly known as: Ambien CR Take 1 tablet (12.5 mg total) by mouth at bedtime as needed for sleep.      Allergies  Allergen Reactions  . Losartan Potassium-Hctz Diarrhea  . Nsaids     On eliquis.   Marland Kitchen Zoloft [Sertraline Hcl] Diarrhea  . Benzodiazepines Other (See Comments)    Agitation.    Follow-up Information    Care, [facility] Follow up.   Specialty: Home Health Services Contact information: [address] [place] [address] [phone]                The results of significant diagnostics from this hospitalization (including imaging, microbiology, ancillary and laboratory) are listed below for  reference.    Significant Diagnostic Studies: No results found.  Microbiology: No results found for this or any previous visit (from the past 240 hour(s)).   Labs: Basic Metabolic Panel: Recent Labs  Lab 09/05/20 0452 09/06/20 1109  NA 137 139  K 3.4* 4.0  CL 101 100  CO2 27 29  GLUCOSE 92 145*  BUN 8 7*  CREATININE 1.02 1.05  CALCIUM 8.4* 8.8*  MG  --  1.9   Liver Function Tests: Recent Labs  Lab 09/05/20 0452  AST 21  ALT 10  ALKPHOS 51  BILITOT 0.9  PROT 5.1*  ALBUMIN 2.7*   No results for input(s): LIPASE, AMYLASE in the last 168 hours. No results for input(s): AMMONIA in the last 168 hours. CBC: No results for input(s): WBC, NEUTROABS, HGB, HCT, MCV, PLT in the last 168 hours. Cardiac Enzymes: No results for input(s): CKTOTAL, CKMB, CKMBINDEX, TROPONINI in the last 168 hours. BNP: BNP (last 3  results) No results for input(s): BNP in the last 8760 hours.  ProBNP (last 3 results) No results for input(s): PROBNP in the last 8760 hours.  CBG: Recent Labs  Lab 09/07/20 2131 09/08/20 0723 09/08/20 1151 09/08/20 1644 09/08/20 2045  GLUCAP 147* 81 110* 116* 153*       Signed:  Nita Sells MD   [facility] 09/09/2020, 1:35 PM

## 2020-09-09 NOTE — TOC Progression Note (Addendum)
Transition of Care (TOC) - Progression Note    Patient Details  Name: [name] CASSARINO Sr. MRN: [id number] Date of Birth: [birth year]  Transition of Care [facility]) CM/SW Contact  Worley Radermacher, Marjie Skiff, RN Phone Number: 09/09/2020, 10:47 AM  Clinical Narrative:    8938: Spoke with son at bedside and wife via facetime. Still no return call from [facility]. Wife states that she does not have anyone that can stay with pt in the hospital over the weekend and she would rather him just come home. Choice offered for home health services and [facility] chosen. [facility] liaison contacted for referral. Awaiting HH orders.   Pt was psych cleared on 12/27 and this CM has been attempting to find pt a locked SNF bed but has been unsuccessful. [facility] is only facility left who has not responded. Multiple voicemails left at [facility]. Pt is now doing much better with Physical Therapy, and may end up needing to go back home with family and home health services. TOC will continue to follow.   Expected Discharge Plan: Skilled Nursing Facility Barriers to Discharge: SNF Pending bed offer  Expected Discharge Plan and Services Expected Discharge Plan: [place] In-house Referral: Clinical Social Work Discharge Planning Services: CM Consult Post Acute Care Choice: [place] arrangements for the past 2 months: Single Family Home                    Social Determinants of Health (SDOH) Interventions    Readmission Risk Interventions Readmission Risk Prevention Plan 02/18/2019  Transportation Screening Complete  PCP or Specialist Appt within 3-5 Days Complete  HRI or [place] Complete  Social Work Consult for [place] Planning/Counseling Complete  Palliative Care Screening Not Applicable  Medication Review [employer]) Complete  Some recent data might be hidden

## 2020-09-12 ENCOUNTER — Other Ambulatory Visit: Payer: Self-pay

## 2020-09-12 ENCOUNTER — Telehealth: Payer: Self-pay

## 2020-09-12 NOTE — Patient Outreach (Signed)
[facility]) Care Management  09/12/2020  VANDY TSUCHIYA Sr. 1951/08/04 [id number]    EMMI-General Discharge RED ON EMMI ALERT Day # 1 Date: 09/11/2020 Red Alert Reason: "Know who to call about changes in condition? No Scheduled follow-up? No"   Outreach attempt # 1 to patient. Spoke with spouse due to patient's documented mental status and dementia. Reviewed and addressed red alerts. She is aware of s/s of worsening condition and when to contact MD She plans to call PCP office today to make follow up appt and discuss need for refill on some of his meds. She reports that she has an aide that comes in to assist her with patient as well as her mother who also suffers from dementia. Patient to receive [facility] services. Spouse has not heard from them yet possibly due to holiday. She is has contact info and will call agency if no one calls her today. Souse inquiring what she needs to do if she decides to proceed with SNF placement of patient down the road. RN CM reviewed with spouse process and neccessary paperwork that MD would need to complete. She voiced understanding. She denies any other RN CM needs or concerns at this time.     Plan: RN CM will close case at this time.   Enzo Montgomery, RN,BSN,CCM [place] Management Telephonic Care Management Coordinator Direct Phone: [phone] Toll Free: [phone] Fax: [phone]

## 2020-09-12 NOTE — Telephone Encounter (Signed)
Transition Care Management Follow-up Telephone Call  Date of discharge and from where: 09/09/2020-[place]  How have you been since you were released from the hospital? Not good per wife. Wife says patient is still very confused. She says she is not sure how long she will be able to care for him. He gets up durng the night & wanders. She says he is no better than when he was admitted to the hospital. Wife advised to take pt to the ER or call 911 for worsening of symptoms.  Any questions or concerns? No  Items Reviewed:  Did the pt receive and understand the discharge instructions provided? Yes per wife  Medications obtained and verified? Yes   Other? Yes   Any new allergies since your discharge? No   Dietary orders reviewed? Yes  Do you have support at home? Yes   Home Care and Equipment/Supplies: Were home health services ordered? yes If so, what is the name of the agency? [facility]  Has the agency set up a time to come to the patient's home? no Were any new equipment or medical supplies ordered?  No What is the name of the medical supply agency? n/a Were you able to get the supplies/equipment? not applicable Do you have any questions related to the use of the equipment or supplies? n/a  Functional Questionnaire: (I = Independent and D = Dependent) ADLs: D  Bathing/Dressing- D  Meal Prep- D  Eating- D  Maintaining continence- D  Transferring/Ambulation- D  Managing Meds- D  Follow up appointments reviewed:   PCP Hospital f/u appt confirmed? Yes  Scheduled to see Dr. [name] on 09/14/20 @ 2:00.  [facility] f/u appt confirmed? n/a   Are transportation arrangements needed? No   If their condition worsens, is the pt aware to call PCP or go to the Emergency Dept.? Yes  Was the patient provided with contact information for the PCP's office or ED? Yes  Was to pt encouraged to call back with questions or concerns? Yes

## 2020-09-14 ENCOUNTER — Encounter: Payer: Self-pay | Admitting: Family Medicine

## 2020-09-14 ENCOUNTER — Other Ambulatory Visit: Payer: Self-pay

## 2020-09-14 ENCOUNTER — Ambulatory Visit (INDEPENDENT_AMBULATORY_CARE_PROVIDER_SITE_OTHER): Payer: Medicare HMO | Admitting: Family Medicine

## 2020-09-14 VITALS — BP 97/58 | HR 102 | Temp 98.3°F | Ht 67.0 in | Wt 229.0 lb

## 2020-09-14 DIAGNOSIS — Z7901 Long term (current) use of anticoagulants: Secondary | ICD-10-CM | POA: Diagnosis not present

## 2020-09-14 DIAGNOSIS — Z0000 Encounter for general adult medical examination without abnormal findings: Secondary | ICD-10-CM | POA: Insufficient documentation

## 2020-09-14 DIAGNOSIS — R0989 Other specified symptoms and signs involving the circulatory and respiratory systems: Secondary | ICD-10-CM | POA: Insufficient documentation

## 2020-09-14 DIAGNOSIS — E66813 Obesity, class 3: Secondary | ICD-10-CM

## 2020-09-14 DIAGNOSIS — K219 Gastro-esophageal reflux disease without esophagitis: Secondary | ICD-10-CM | POA: Insufficient documentation

## 2020-09-14 DIAGNOSIS — D485 Neoplasm of uncertain behavior of skin: Secondary | ICD-10-CM

## 2020-09-14 DIAGNOSIS — R42 Dizziness and giddiness: Secondary | ICD-10-CM | POA: Insufficient documentation

## 2020-09-14 DIAGNOSIS — E039 Hypothyroidism, unspecified: Secondary | ICD-10-CM | POA: Diagnosis not present

## 2020-09-14 DIAGNOSIS — R9431 Abnormal electrocardiogram [ECG] [EKG]: Secondary | ICD-10-CM

## 2020-09-14 DIAGNOSIS — K521 Toxic gastroenteritis and colitis: Secondary | ICD-10-CM | POA: Insufficient documentation

## 2020-09-14 DIAGNOSIS — F0391 Unspecified dementia with behavioral disturbance: Secondary | ICD-10-CM

## 2020-09-14 DIAGNOSIS — J329 Chronic sinusitis, unspecified: Secondary | ICD-10-CM | POA: Insufficient documentation

## 2020-09-14 DIAGNOSIS — I160 Hypertensive urgency: Secondary | ICD-10-CM | POA: Insufficient documentation

## 2020-09-14 DIAGNOSIS — Z860100 Personal history of colon polyps, unspecified: Secondary | ICD-10-CM

## 2020-09-14 DIAGNOSIS — N400 Enlarged prostate without lower urinary tract symptoms: Secondary | ICD-10-CM

## 2020-09-14 DIAGNOSIS — G4700 Insomnia, unspecified: Secondary | ICD-10-CM

## 2020-09-14 DIAGNOSIS — G510 Bell's palsy: Secondary | ICD-10-CM | POA: Insufficient documentation

## 2020-09-14 DIAGNOSIS — F339 Major depressive disorder, recurrent, unspecified: Secondary | ICD-10-CM

## 2020-09-14 DIAGNOSIS — F411 Generalized anxiety disorder: Secondary | ICD-10-CM

## 2020-09-14 DIAGNOSIS — M545 Low back pain, unspecified: Secondary | ICD-10-CM | POA: Insufficient documentation

## 2020-09-14 DIAGNOSIS — I4891 Unspecified atrial fibrillation: Secondary | ICD-10-CM

## 2020-09-14 DIAGNOSIS — E538 Deficiency of other specified B group vitamins: Secondary | ICD-10-CM

## 2020-09-14 DIAGNOSIS — F419 Anxiety disorder, unspecified: Secondary | ICD-10-CM

## 2020-09-14 DIAGNOSIS — J40 Bronchitis, not specified as acute or chronic: Secondary | ICD-10-CM | POA: Insufficient documentation

## 2020-09-14 DIAGNOSIS — Z6841 Body Mass Index (BMI) 40.0 and over, adult: Secondary | ICD-10-CM

## 2020-09-14 DIAGNOSIS — R197 Diarrhea, unspecified: Secondary | ICD-10-CM | POA: Insufficient documentation

## 2020-09-14 DIAGNOSIS — E7800 Pure hypercholesterolemia, unspecified: Secondary | ICD-10-CM | POA: Insufficient documentation

## 2020-09-14 DIAGNOSIS — K5100 Ulcerative (chronic) pancolitis without complications: Secondary | ICD-10-CM | POA: Insufficient documentation

## 2020-09-14 DIAGNOSIS — E119 Type 2 diabetes mellitus without complications: Secondary | ICD-10-CM

## 2020-09-14 HISTORY — DX: Neoplasm of uncertain behavior of skin: D48.5

## 2020-09-14 HISTORY — DX: Gastro-esophageal reflux disease without esophagitis: K21.9

## 2020-09-14 HISTORY — DX: Personal history of colon polyps, unspecified: Z86.0100

## 2020-09-14 HISTORY — DX: Personal history of colonic polyps: Z86.010

## 2020-09-14 HISTORY — DX: Bell's palsy: G51.0

## 2020-09-14 MED ORDER — SITAGLIPTIN PHOSPHATE 25 MG PO TABS: 25.0000 mg | ORAL_TABLET | Freq: Every day | ORAL | 1 refills | Status: DC

## 2020-09-14 MED ORDER — TAMSULOSIN HCL 0.4 MG PO CAPS: 0.4000 mg | ORAL_CAPSULE | Freq: Every day | ORAL | 1 refills | Status: DC

## 2020-09-14 MED ORDER — DULOXETINE HCL 60 MG PO CPEP: 60.0000 mg | ORAL_CAPSULE | Freq: Every day | ORAL | 1 refills | Status: DC

## 2020-09-14 MED ORDER — OLANZAPINE 10 MG PO TABS: ORAL_TABLET | ORAL | 0 refills | Status: DC

## 2020-09-14 MED ORDER — DIVALPROEX SODIUM 125 MG PO CSDR: 750.0000 mg | DELAYED_RELEASE_CAPSULE | Freq: Two times a day (BID) | ORAL | 3 refills | Status: DC

## 2020-09-14 NOTE — Patient Instructions (Addendum)
Zypreza dose unchanged but pill will change: 5 mg in the morning and 15 mg evening (hopefully, this will be 1/2 tab in the morning and 1.5 tabs before bed- if still 10 mg tabs).  I have referred you to psychiatry again. It is important to get him in asap.    Continue B12 and vit d doses.  Melatonin is fine to do.  Make sure he has a routine sleep and eat pattern etc.   Follow up in 1 month.     Dementia Caregiver Guide Dementia is a term used to describe a number of symptoms that affect memory and thinking. The most common symptoms include:  Memory loss.  Trouble with language and communication.  Trouble concentrating.  Poor judgment.  Problems with reasoning.  Child-like behavior and language.  Extreme anxiety.  Angry outbursts.  Wandering from home or public places. Dementia usually gets worse slowly over time. In the early stages, people with dementia can stay independent and safe with some help. In later stages, they need help with daily tasks such as dressing, grooming, and using the bathroom. How to help the person with dementia cope Dementia can be frightening and confusing. Here are some tips to help the person with dementia cope with changes caused by the disease. General tips  Keep the person on track with his or her routine.  Try to identify areas where the person may need help.  Be supportive, patient, calm, and encouraging.  Gently remind the person that adjusting to changes takes time.  Help with the tasks that the person has asked for help with.  Keep the person involved in daily tasks and decisions as much as possible.  Encourage conversation, but try not to get frustrated or harried if the person struggles to find words or does not seem to appreciate your help. Communication tips  When the person is talking or seems frustrated, make eye contact and hold the person's hand.  Ask specific questions that need yes or no answers.  Use simple words,  short sentences, and a calm voice. Only give one direction at a time.  When offering choices, limit them to just 1 or 2.  Avoid correcting the person in a negative way.  If the person is struggling to find the right words, gently try to help him or her. How to recognize symptoms of stress Symptoms of stress in caregivers include:  Feeling frustrated or angry with the person with dementia.  Denying that the person has dementia or that his or her symptoms will not improve.  Feeling hopeless and unappreciated.  Difficulty sleeping.  Difficulty concentrating.  Feeling anxious, irritable, or depressed.  Developing stress-related health problems.  Feeling like you have too little time for your own life. Follow these instructions at home:   Make sure that you and the person you are caring for: ? Get regular sleep. ? Exercise regularly. ? Eat regular, nutritious meals. ? Drink enough fluid to keep your urine clear or pale yellow. ? Take over-the-counter and prescription medicines only as told by your health care providers. ? Attend all scheduled health care appointments.  Join a support group with others who are caregivers.  Ask about respite care resources so that you can have a regular break from the stress of caregiving.  Look for signs of stress in yourself and in the person you are caring for. If you notice signs of stress, take steps to manage it.  Consider any safety risks and take steps to  avoid them.  Organize medications in a pill box for each day of the week.  Create a plan to handle any legal or financial matters. Get legal or financial advice if needed.  Keep a calendar in a central location to remind the person of appointments or other activities. Tips for reducing the risk of injury  Keep floors clear of clutter. Remove rugs, magazine racks, and floor lamps.  Keep hallways well lit, especially at night.  Put a handrail and nonslip mat in the bathtub or  shower.  Put childproof locks on cabinets that contain dangerous items, such as medicines, alcohol, guns, toxic cleaning items, sharp tools or utensils, matches, and lighters.  Put the locks in places where the person cannot see or reach them easily. This will help ensure that the person does not wander out of the house and get lost.  Be prepared for emergencies. Keep a list of emergency phone numbers and addresses in a convenient area.  Remove car keys and lock garage doors so that the person does not try to get in the car and drive.  Have the person wear a bracelet that tracks locations and identifies the person as having memory problems. This should be worn at all times for safety. Where to find support: Many individuals and organizations offer support. These include:  Support groups for people with dementia and for caregivers.  Counselors or therapists.  Home health care services.  Adult day care centers. Where to find more information Alzheimer's Association: [employer] Contact a health care provider if:  The person's health is rapidly getting worse.  You are no longer able to care for the person.  Caring for the person is affecting your physical and emotional health.  The person threatens himself or herself, you, or anyone else. Summary  Dementia is a term used to describe a number of symptoms that affect memory and thinking.  Dementia usually gets worse slowly over time.  Take steps to reduce the person's risk of injury, and to plan for future care.  Caregivers need support, relief from caregiving, and time for their own lives. This information is not intended to replace advice given to you by your health care provider. Make sure you discuss any questions you have with your health care provider. Document Revised: 08/09/2017 Document Reviewed: 07/31/2016 Elsevier Patient Education  2020 [employer].

## 2020-09-14 NOTE — Progress Notes (Signed)
[name] Flesher Sr. , [birth year], 70 y.o., male MRN: [id number] Patient Care Team    Relationship Specialty Notifications Start End  [name], DO PCP - General Family Medicine  11/08/17   Troy Sine, MD PCP - Cardiology Cardiology Admissions 02/17/19   [name], MD Consulting Physician Neurology  11/08/17   Clarene Essex, MD Consulting Physician Gastroenterology  11/29/17   Melissa Noon, [place] Referring Physician Optometry  06/30/20   Rod Can, MD Consulting Physician Orthopedic Surgery  06/30/20   Ashley Royalty, [place]  Physician Assistant  06/30/20     Chief Complaint  Patient presents with  . Follow-up    Hos f/u     Subjective:  [name] VANPUTTEN Sr.  is a 70 y.o. male presents for hospital follow up after recent admission on 07/13/2020 for primary diagnosis dementia with behavioral disturbances.  Patient was discharged on 09/09/2020 to home. Patients discharge summary has been reviewed, as well as all labs/image studies obtained during hospitalization.   Patients hospital course: Patient had had worsening depression, anxiety and altered mental status prior to date of admission.  Onset seemed to be somewhat correlated to the loss of a family member around that time.  Outpatient attempts to prescribe regimen to help with depression, anxiety and altered mental status failed.  Wife had concerns for safety of patient and others.  She called into office and was encouraged to take patient to emergency room immediately.  Patient presented to the emergency room with agitation and confusion.  He was followed by psychiatry team and multiple medications were adjusted to help control his symptoms.  There were attempts to be made to obtain a Geri psych placement however that was never able to be fulfilled.  His wife has since retired from her job and is able to care for patient in the home.  Psychiatry team believes they were able to improve his symptoms. Since hospital discharge patient  reports patient and his wife presents today for follow-up after hospital discharge for his agitation, confusion, altered mental status, dementia, depression with anxiety and bipolar disorder.  Wife reports she thinks he is better on current regimen.  All the symptoms of his dementia, depression anxiety are still present but he is not as agitated and she feels she is able to manage him at home.  They do not have psychiatry follow-up as of yet.  No results for input(s): HGB, HCT, WBC, PLT in the last 168 hours. CMP Latest Ref Rng & Units 09/06/2020 09/05/2020 08/31/2020  Glucose 70 - 99 mg/dL 145(H) 92 100(H)  BUN 8 - 23 mg/dL 7(L) 8 7(L)  Creatinine 0.61 - 1.24 mg/dL 1.05 1.02 0.98  Sodium 135 - 145 mmol/L 139 137 140  Potassium 3.5 - 5.1 mmol/L 4.0 3.4(L) 4.5  Chloride 98 - 111 mmol/L 100 101 102  CO2 22 - 32 mmol/L 29 27 28   Calcium 8.9 - 10.3 mg/dL 8.8(L) 8.4(L) 8.9  Total Protein 6.5 - 8.1 g/dL - 5.1(L) 5.4(L)  Total Bilirubin 0.3 - 1.2 mg/dL - 0.9 1.0  Alkaline Phos 38 - 126 U/L - 51 53  AST 15 - 41 U/L - 21 22  ALT 0 - 44 U/L - 10 12    No results found.   Depression screen [facility] 2/9 06/30/2020 12/09/2019 05/26/2019 11/17/2018 05/14/2018  Decreased Interest 3 0 0 0 1  Down, Depressed, Hopeless 3 1 1 1 3   PHQ - 2 Score 6 1 1 1  4  Altered sleeping 3 0 1 1 2   Tired, decreased energy 2 2 1 1  0  Change in appetite 3 1 1  0 0  Feeling bad or failure about yourself  3 0 1 1 1   Trouble concentrating 1 1 1  0 1  Moving slowly or fidgety/restless 2 0 1 0 0  Suicidal thoughts 1 0 0 0 0  PHQ-9 Score 21 5 7 4 8   Difficult doing work/chores - Not difficult at all Somewhat difficult Somewhat difficult Somewhat difficult    Allergies  Allergen Reactions  . Losartan Potassium-Hctz Diarrhea  . Nsaids     On eliquis.   Marland Kitchen Zoloft [Sertraline Hcl] Diarrhea  . Benzodiazepines Other (See Comments)    Agitation.   Social History   Tobacco Use  . Smoking status: Former Smoker    Packs/day: 1.00     Years: 19.00    Pack years: 19.00    Quit date: 02/25/1982    Years since quitting: 38.5  . Smokeless tobacco: Former Systems developer    Quit date: 1990  Substance Use Topics  . Alcohol use: Yes    Comment: occ   Past Medical History:  Diagnosis Date  . Acute delirium 07/14/2020  . Adenomatous colon polyp 2016  . Allergy   . AMS (altered mental status) 07/13/2020  . Anxiety   . Bell palsy    resolved  . Bell's palsy 09/14/2020  . Chicken pox   . Concussion with no loss of consciousness 07/07/2018  . Depression   . Diet-controlled diabetes mellitus ([place])   . Eating disorder   . Essential hypertension 11/29/2017  . Gastro-esophageal reflux disease without esophagitis 09/14/2020  . GERD (gastroesophageal reflux disease)   . History of vitamin D deficiency   . Hyperlipidemia   . Hypertension   . Hypokalemia   . Memory loss   . MI (myocardial infarction) ([place])    per pt   . Neoplasm of uncertain behavior of skin 09/14/2020  . Ulcerative colitis ([place]) 2005-2006   severe    Past Surgical History:  Procedure Laterality Date  . COLONOSCOPY WITH PROPOFOL N/A 07/15/2014   Procedure: COLONOSCOPY WITH PROPOFOL;  Surgeon: [name], MD;  Location: [facility];  Service: Endoscopy;  Laterality: N/A;  . COLONOSCOPY WITH PROPOFOL N/A 08/22/2015   Procedure: COLONOSCOPY WITH PROPOFOL;  Surgeon: [name], MD;  Location: [facility];  Service: Endoscopy;  Laterality: N/A;  . ORIF ANKLE FRACTURE  02/27/2012   Procedure: OPEN REDUCTION INTERNAL FIXATION (ORIF) ANKLE FRACTURE;  Surgeon: Colin Rhein, MD;  Location: [facility];  Service: Orthopedics;  Laterality: Left;  ORIF left bimalleolar ankle fracture  . SHOULDER SURGERY     LEFT  . SIGMOIDOSCOPY  10/08/2018   Externa and Internal Hmorrhoids. Tubular and Tublovillous adenoma removed from transverse colon. Inflammatory pseudopolyps, negative for dysplasia  . TONSILLECTOMY    . UPPER GASTROINTESTINAL ENDOSCOPY     Family  History  Problem Relation Age of Onset  . Dementia Mother   . Hypertension Mother   . Early death Father 36       drowning   . Post-traumatic stress disorder Brother   . Neuropathy Brother    Allergies as of 09/14/2020      Reactions   Losartan Potassium-hctz Diarrhea   Nsaids    On eliquis.    Zoloft [sertraline Hcl] Diarrhea   Benzodiazepines Other (See Comments)   Agitation.      Medication List  Accurate as of September 14, 2020 11:59 PM. If you have any questions, ask your nurse or doctor.        STOP taking these medications   acetaminophen 325 MG tablet Commonly known as: TYLENOL Stopped by: Howard Pouch, DO   alum & mag hydroxide-simeth 200-200-20 MG/5ML suspension Commonly known as: MAALOX/MYLANTA Stopped by: Howard Pouch, DO   ROLAIDS PO Stopped by: Howard Pouch, DO   zolpidem 12.5 MG CR tablet Commonly known as: Ambien CR Stopped by: Howard Pouch, DO     TAKE these medications   apixaban 5 MG Tabs tablet Commonly known as: ELIQUIS Take 1 tablet (5 mg total) by mouth 2 (two) times daily.   atorvastatin 20 MG tablet Commonly known as: LIPITOR Take 1 tablet (20 mg total) by mouth daily.   azaTHIOprine 50 MG tablet Commonly known as: IMURAN Take 200 mg by mouth every morning.   Cyanocobalamin 2500 MCG Tabs 1 tab daily po What changed:   how much to take  how to take this  when to take this   divalproex 125 MG capsule Commonly known as: DEPAKOTE SPRINKLE Take 6 capsules (750 mg total) by mouth every 12 (twelve) hours.   DULoxetine 60 MG capsule Commonly known as: CYMBALTA Take 1 capsule (60 mg total) by mouth daily.   levothyroxine 25 MCG tablet Commonly known as: Synthroid Take 1 tablet (25 mcg total) by mouth daily before breakfast.   OLANZapine 10 MG tablet Commonly known as: ZYPREXA 1/2 tab in the morning and 1.5 tabs QHS What changed:   how much to take  how to take this  when to take this  additional  instructions  Another medication with the same name was removed. Continue taking this medication, and follow the directions you see here. Changed by: Howard Pouch, DO   potassium chloride 10 MEQ tablet Commonly known as: Klor-Con M10 Take 1 tablet (10 mEq total) by mouth daily.   sitaGLIPtin 25 MG tablet Commonly known as: Januvia Take 1 tablet (25 mg total) by mouth daily.   tamsulosin 0.4 MG Caps capsule Commonly known as: FLOMAX Take 1 capsule (0.4 mg total) by mouth daily.       All past medical history, surgical history, allergies, family history, immunizations and medications were updated in the EMR today and reviewed under the history and medication portions of their EMR.      ROS: Negative, with the exception of above mentioned in HPI   Objective:  BP (!) 97/58   Pulse (!) 102   Temp 98.3 F (36.8 C) (Oral)   Ht 5' 7"  (1.702 m)   Wt 229 lb (103.9 kg)   SpO2 95%   BMI 35.87 kg/m  Body mass index is 35.87 kg/m. Gen: Afebrile. No acute distress. Nontoxic in appearance, well developed, well nourished.  Pleasant male. HENT: AT. [place].  Eyes:Pupils Equal Round Reactive to light, Extraocular movements intact,  Conjunctiva without redness, discharge or icterus. CV: RRR  Chest: CTAB, no wheeze or crackles. Good air movement, normal resp effort. .  Neuro:  Normal gait. PERLA. EOMi. Alert. Oriented x3  Psych: Normal  dress and demeanor. Normal speech.  Pleasant.  Talkative.  Difficult to tell if he is fabricating answers to be playful/joking or if believes the stories are real.  Noncombative.  Calm.  Cooperative  Assessment/Plan: REIN POPOV Sr. is a 70 y.o. male present for OV for Hospital discharge follow up Major depression, recurrent, chronic (HCC)/anxiety/dementia with behavioral disturbances/insomnia Patient does seem  to be more calm today.  Not agitated.  Wife feels she should be able to care for him in the home in his present state. DC Ambien.  Was having  opposite effect on him at home and causing some mild agitation.  Encouraged melatonin use and maintaining a regular sleep, eat, wake schedule etc. Continue Zyprexa 5 mg in the morning and 15 mg before bed> refilled for them today to last until able to get into his new psychiatry team which will take over management Continue Depakote 750 mg twice daily> refilled for them today to last until able to get into his new psychiatry team which take over management Continue Cymbalta 60 mg daily Last Depakote level was in normal range.  Will repeat level at his follow-up appointment to ensure he is maintaining levels at home - Ambulatory referral to Psychiatry> urgently QT prolongation-precautions on medication choices with QT prolongation  BPH: Stable on Flomax 0.4 mg daily.  Chronic anticoagulation/A. Fib/hyperlipidemia Blood pressure and rate controlled today.  We will not start back metoprolol at this time. Continue Eliquis 5 mg twice daily Continue Lipitor 20 mg daily  Hypothyroidism, unspecified type Continue levothyroxine 25 mcg daily TSH due at next appointment in 4 weeks  Vitamin B 12 deficiency Encouraged him to continue B12 supplementation  Diabetes mellitus without complication (HCC)/obesity This is a newer diagnosis for him.  Tolerating Januvia Continue Januvia 25 mg daily A1c due at next visit in 4 weeks    Reviewed expectations re: course of current medical issues.  Discussed self-management of symptoms.  Outlined signs and symptoms indicating need for more acute intervention.  Patient verbalized understanding and all questions were answered.  Patient received an After-Visit Summary.  Any changes in medications were reviewed and patient was provided with updated med list with their AVS.      Orders Placed This Encounter  Procedures  . Ambulatory referral to Psychiatry   Meds ordered this encounter  Medications  . divalproex (DEPAKOTE SPRINKLE) 125 MG capsule     Sig: Take 6 capsules (750 mg total) by mouth every 12 (twelve) hours.    Dispense:  360 capsule    Refill:  3    DC other scripts for this med.  . DULoxetine (CYMBALTA) 60 MG capsule    Sig: Take 1 capsule (60 mg total) by mouth daily.    Dispense:  90 capsule    Refill:  1    DC other scripts for this med.  Marland Kitchen OLANZapine (ZYPREXA) 10 MG tablet    Sig: 1/2 tab in the morning and 1.5 tabs QHS    Dispense:  180 tablet    Refill:  0    DC other scripts for this med.  . tamsulosin (FLOMAX) 0.4 MG CAPS capsule    Sig: Take 1 capsule (0.4 mg total) by mouth daily.    Dispense:  90 capsule    Refill:  1    DC other scripts for this med.  . sitaGLIPtin (JANUVIA) 25 MG tablet    Sig: Take 1 tablet (25 mg total) by mouth daily.    Dispense:  90 tablet    Refill:  1    DC other scripts for this med.    Note is dictated utilizing voice recognition software. Although note has been proof read prior to signing, occasional typographical errors still can be missed. If any questions arise, please do not hesitate to call for verification.   electronically signed by:  Howard Pouch, DO   Primary Care - [place]

## 2020-09-15 ENCOUNTER — Encounter: Payer: Self-pay | Admitting: Family Medicine

## 2020-09-15 DIAGNOSIS — N400 Enlarged prostate without lower urinary tract symptoms: Secondary | ICD-10-CM | POA: Insufficient documentation

## 2020-09-15 DIAGNOSIS — E119 Type 2 diabetes mellitus without complications: Secondary | ICD-10-CM | POA: Insufficient documentation

## 2020-09-19 DIAGNOSIS — M1711 Unilateral primary osteoarthritis, right knee: Secondary | ICD-10-CM | POA: Diagnosis not present

## 2020-09-21 DIAGNOSIS — E039 Hypothyroidism, unspecified: Secondary | ICD-10-CM | POA: Diagnosis not present

## 2020-09-21 DIAGNOSIS — F05 Delirium due to known physiological condition: Secondary | ICD-10-CM | POA: Diagnosis not present

## 2020-09-21 DIAGNOSIS — E538 Deficiency of other specified B group vitamins: Secondary | ICD-10-CM | POA: Diagnosis not present

## 2020-09-21 DIAGNOSIS — F0391 Unspecified dementia with behavioral disturbance: Secondary | ICD-10-CM | POA: Diagnosis not present

## 2020-09-21 DIAGNOSIS — I10 Essential (primary) hypertension: Secondary | ICD-10-CM | POA: Diagnosis not present

## 2020-09-21 DIAGNOSIS — F319 Bipolar disorder, unspecified: Secondary | ICD-10-CM | POA: Diagnosis not present

## 2020-09-21 DIAGNOSIS — E876 Hypokalemia: Secondary | ICD-10-CM | POA: Diagnosis not present

## 2020-09-21 DIAGNOSIS — F411 Generalized anxiety disorder: Secondary | ICD-10-CM | POA: Diagnosis not present

## 2020-09-21 DIAGNOSIS — I482 Chronic atrial fibrillation, unspecified: Secondary | ICD-10-CM | POA: Diagnosis not present

## 2020-09-22 ENCOUNTER — Telehealth: Payer: Self-pay | Admitting: Family Medicine

## 2020-09-22 DIAGNOSIS — F05 Delirium due to known physiological condition: Secondary | ICD-10-CM | POA: Diagnosis not present

## 2020-09-22 DIAGNOSIS — F319 Bipolar disorder, unspecified: Secondary | ICD-10-CM | POA: Diagnosis not present

## 2020-09-22 DIAGNOSIS — E039 Hypothyroidism, unspecified: Secondary | ICD-10-CM | POA: Diagnosis not present

## 2020-09-22 DIAGNOSIS — E876 Hypokalemia: Secondary | ICD-10-CM | POA: Diagnosis not present

## 2020-09-22 DIAGNOSIS — F0391 Unspecified dementia with behavioral disturbance: Secondary | ICD-10-CM | POA: Diagnosis not present

## 2020-09-22 DIAGNOSIS — I10 Essential (primary) hypertension: Secondary | ICD-10-CM | POA: Diagnosis not present

## 2020-09-22 DIAGNOSIS — F411 Generalized anxiety disorder: Secondary | ICD-10-CM | POA: Diagnosis not present

## 2020-09-22 DIAGNOSIS — E538 Deficiency of other specified B group vitamins: Secondary | ICD-10-CM | POA: Diagnosis not present

## 2020-09-22 DIAGNOSIS — I482 Chronic atrial fibrillation, unspecified: Secondary | ICD-10-CM | POA: Diagnosis not present

## 2020-09-22 NOTE — Telephone Encounter (Signed)
Please advise 

## 2020-09-22 NOTE — Telephone Encounter (Signed)
Inez Catalina with [name] called regarding patient's Depakote. She states taking 6 capsules (750 mg) twice daily is becoming a problem for him. He does not want to swallow that many pills. She is requesting change to 541m + 2569mtabs instead. Betty's callback number is [phone]

## 2020-09-23 ENCOUNTER — Telehealth: Payer: Self-pay | Admitting: Family Medicine

## 2020-09-23 DIAGNOSIS — E538 Deficiency of other specified B group vitamins: Secondary | ICD-10-CM | POA: Diagnosis not present

## 2020-09-23 DIAGNOSIS — F0391 Unspecified dementia with behavioral disturbance: Secondary | ICD-10-CM | POA: Diagnosis not present

## 2020-09-23 DIAGNOSIS — E876 Hypokalemia: Secondary | ICD-10-CM | POA: Diagnosis not present

## 2020-09-23 DIAGNOSIS — F411 Generalized anxiety disorder: Secondary | ICD-10-CM | POA: Diagnosis not present

## 2020-09-23 DIAGNOSIS — F319 Bipolar disorder, unspecified: Secondary | ICD-10-CM | POA: Diagnosis not present

## 2020-09-23 DIAGNOSIS — I482 Chronic atrial fibrillation, unspecified: Secondary | ICD-10-CM | POA: Diagnosis not present

## 2020-09-23 DIAGNOSIS — E039 Hypothyroidism, unspecified: Secondary | ICD-10-CM | POA: Diagnosis not present

## 2020-09-23 DIAGNOSIS — I10 Essential (primary) hypertension: Secondary | ICD-10-CM | POA: Diagnosis not present

## 2020-09-23 DIAGNOSIS — F05 Delirium due to known physiological condition: Secondary | ICD-10-CM | POA: Diagnosis not present

## 2020-09-23 NOTE — Telephone Encounter (Signed)
Please advise on v/o

## 2020-09-23 NOTE — Telephone Encounter (Signed)
approved

## 2020-09-23 NOTE — Telephone Encounter (Signed)
Sharyn Lull with [name] called to request verbal orders for HH/OT: Once weekly x 1 week, twice weekly x 2, once weekly x 3 weeks. Her callback number is [phone].

## 2020-09-23 NOTE — Telephone Encounter (Signed)
V/o given to [facility]

## 2020-09-23 NOTE — Telephone Encounter (Signed)
Spoke with [name] and informed her of pills. Pt wife verbalized understanding and agree with plan

## 2020-09-23 NOTE — Telephone Encounter (Signed)
Please inform [name] this medication will be taken over by his psychiatry team once we can get him established.  This is simply a bridge refill between hospitalization and his outpatient psychiatry evaluation.  This is what his psychiatrist has recommended he take.

## 2020-09-28 ENCOUNTER — Telehealth: Payer: Self-pay

## 2020-09-28 DIAGNOSIS — E538 Deficiency of other specified B group vitamins: Secondary | ICD-10-CM | POA: Diagnosis not present

## 2020-09-28 DIAGNOSIS — F0391 Unspecified dementia with behavioral disturbance: Secondary | ICD-10-CM | POA: Diagnosis not present

## 2020-09-28 DIAGNOSIS — F411 Generalized anxiety disorder: Secondary | ICD-10-CM | POA: Diagnosis not present

## 2020-09-28 DIAGNOSIS — E876 Hypokalemia: Secondary | ICD-10-CM | POA: Diagnosis not present

## 2020-09-28 DIAGNOSIS — E039 Hypothyroidism, unspecified: Secondary | ICD-10-CM | POA: Diagnosis not present

## 2020-09-28 DIAGNOSIS — F319 Bipolar disorder, unspecified: Secondary | ICD-10-CM | POA: Diagnosis not present

## 2020-09-28 DIAGNOSIS — I10 Essential (primary) hypertension: Secondary | ICD-10-CM | POA: Diagnosis not present

## 2020-09-28 DIAGNOSIS — F05 Delirium due to known physiological condition: Secondary | ICD-10-CM | POA: Diagnosis not present

## 2020-09-28 DIAGNOSIS — I482 Chronic atrial fibrillation, unspecified: Secondary | ICD-10-CM | POA: Diagnosis not present

## 2020-09-28 NOTE — Telephone Encounter (Signed)
Richardson Landry from [facility] calling patients medications.  Patient will now be using [facility] mail order pharmacy.  Patient does not need any refills at this time.  He stated that prescriptions will be filled at 30 d/s, he currently gets 61 d/s with [facility] and their mail order pharmacy. Richardson Landry assured me that he has spoke with patient about cost of [place] pharmacy program and it is not going to cost patient any extra money.  Richardson Landry from [facility] does not need a call back at this time.

## 2020-09-30 ENCOUNTER — Telehealth: Payer: Self-pay

## 2020-09-30 DIAGNOSIS — F411 Generalized anxiety disorder: Secondary | ICD-10-CM

## 2020-09-30 DIAGNOSIS — E538 Deficiency of other specified B group vitamins: Secondary | ICD-10-CM | POA: Diagnosis not present

## 2020-09-30 DIAGNOSIS — F0391 Unspecified dementia with behavioral disturbance: Secondary | ICD-10-CM

## 2020-09-30 DIAGNOSIS — E039 Hypothyroidism, unspecified: Secondary | ICD-10-CM | POA: Diagnosis not present

## 2020-09-30 DIAGNOSIS — F319 Bipolar disorder, unspecified: Secondary | ICD-10-CM | POA: Diagnosis not present

## 2020-09-30 DIAGNOSIS — I10 Essential (primary) hypertension: Secondary | ICD-10-CM | POA: Diagnosis not present

## 2020-09-30 DIAGNOSIS — E876 Hypokalemia: Secondary | ICD-10-CM | POA: Diagnosis not present

## 2020-09-30 DIAGNOSIS — F339 Major depressive disorder, recurrent, unspecified: Secondary | ICD-10-CM

## 2020-09-30 DIAGNOSIS — I482 Chronic atrial fibrillation, unspecified: Secondary | ICD-10-CM | POA: Diagnosis not present

## 2020-09-30 DIAGNOSIS — F05 Delirium due to known physiological condition: Secondary | ICD-10-CM | POA: Diagnosis not present

## 2020-09-30 NOTE — Telephone Encounter (Signed)
Spoke with [name] and informed her that the pt chart was still under review.

## 2020-09-30 NOTE — Telephone Encounter (Signed)
Patient has a neurology team, he needs a psychiatrist as well, to manage his psychiatric medications.  Recommendation is on the advisement of the psychiatric team he had during his hospital stay. Please place referral again-if they have closed the referral.   Of note, patient does have dementia, stroke has been ruled out, he is now compliant with CPAP.  He also has rather significant depression with anxiety.

## 2020-09-30 NOTE — Telephone Encounter (Signed)
Please review the message from Psych office below   Per dr De Nurse- pt needs a higher level of care.  Complicated medical comorbidity with dementia, stroke rule out, non compliant with cpap contributing to behaviour/sleep. Should be managed by Neurology

## 2020-09-30 NOTE — Telephone Encounter (Signed)
[facility] - Client Nonclinical Telephone Record  [facility] Client [place] [facility] - Client Client Site [place] - Day Physician [name], [place] Type Call Who Is Calling Patient / Member / Family / Caregiver Caller Name [place] Phone Number [phone] Patient Name [name] Patient DOB 22-Jun-1951 Call Type Message Only Information Provided Reason for Call Request for General Office Information Initial Comment Caller states her husband is awaiting a psychiatrist appt to be made. Disp. Time Disposition Final User 09/30/2020 9:01:45 AM General Information Provided Yes Paulita Cradle Call Closed By: Paulita Cradle Transaction Date/Time: 09/30/2020 8:57:50 AM (ET)

## 2020-09-30 NOTE — Telephone Encounter (Signed)
Spoke with [name] to help assist with this

## 2020-10-04 ENCOUNTER — Telehealth: Payer: Self-pay | Admitting: Family Medicine

## 2020-10-04 ENCOUNTER — Telehealth: Payer: Self-pay

## 2020-10-04 DIAGNOSIS — F319 Bipolar disorder, unspecified: Secondary | ICD-10-CM | POA: Diagnosis not present

## 2020-10-04 DIAGNOSIS — F05 Delirium due to known physiological condition: Secondary | ICD-10-CM | POA: Diagnosis not present

## 2020-10-04 DIAGNOSIS — E039 Hypothyroidism, unspecified: Secondary | ICD-10-CM | POA: Diagnosis not present

## 2020-10-04 DIAGNOSIS — F0391 Unspecified dementia with behavioral disturbance: Secondary | ICD-10-CM | POA: Diagnosis not present

## 2020-10-04 DIAGNOSIS — E538 Deficiency of other specified B group vitamins: Secondary | ICD-10-CM | POA: Diagnosis not present

## 2020-10-04 DIAGNOSIS — F411 Generalized anxiety disorder: Secondary | ICD-10-CM | POA: Diagnosis not present

## 2020-10-04 DIAGNOSIS — I10 Essential (primary) hypertension: Secondary | ICD-10-CM | POA: Diagnosis not present

## 2020-10-04 DIAGNOSIS — I482 Chronic atrial fibrillation, unspecified: Secondary | ICD-10-CM | POA: Diagnosis not present

## 2020-10-04 DIAGNOSIS — E876 Hypokalemia: Secondary | ICD-10-CM | POA: Diagnosis not present

## 2020-10-04 NOTE — Telephone Encounter (Signed)
*  below. 

## 2020-10-04 NOTE — Telephone Encounter (Signed)
Location of psychiatric referral does not matter as long as local.  What matters is that this patient gets established with a psychiatrist to manage his medications.  Please ensure we make this happen ASAP for this patient.  They have been waiting on establishment for quite some time.

## 2020-10-04 NOTE — Telephone Encounter (Signed)
Home Health Orders on provider's desk

## 2020-10-04 NOTE — Telephone Encounter (Signed)
Patient needs to be sure he is taking his metoprolol as scheduled.  Avoid caffeinated drinks.  Encourage decaffeinated beverages. Patient was not supposed to be taking Ambien any longer because it had the opposite effect on him. We cannot call in any other medication without seeing the patient first.  He has failed multiple medications and choices are limited secondary to his heart arrhythmia.

## 2020-10-04 NOTE — Telephone Encounter (Signed)
Their office suggested we use [facility] please advise if okay to switch?

## 2020-10-04 NOTE — Telephone Encounter (Signed)
Please advise 

## 2020-10-04 NOTE — Telephone Encounter (Signed)
[name] with [facility] called to let Dr. [name] know that the patient has not been sleeping well and the Ambien causes adverse side effects, makes him hyper and cannot sleep. Patient has tried melatonin over the counter but that does not work either. Can Ambien be changed to something else?  Also, [name] wanted to report irregular heartbeat from 80-115. She states he drinks 10-12 cans of caffeinated soda per day. She has tried to encourage patient to cut down on this.  She states patient's BP is stable at  107/78 and 111/78 today.   Shraddha's callback number is: [phone].

## 2020-10-05 ENCOUNTER — Other Ambulatory Visit: Payer: Self-pay | Admitting: Family Medicine

## 2020-10-05 ENCOUNTER — Telehealth: Payer: Self-pay | Admitting: Family Medicine

## 2020-10-05 DIAGNOSIS — F411 Generalized anxiety disorder: Secondary | ICD-10-CM | POA: Diagnosis not present

## 2020-10-05 DIAGNOSIS — I10 Essential (primary) hypertension: Secondary | ICD-10-CM | POA: Diagnosis not present

## 2020-10-05 DIAGNOSIS — F05 Delirium due to known physiological condition: Secondary | ICD-10-CM | POA: Diagnosis not present

## 2020-10-05 DIAGNOSIS — E039 Hypothyroidism, unspecified: Secondary | ICD-10-CM | POA: Diagnosis not present

## 2020-10-05 DIAGNOSIS — E876 Hypokalemia: Secondary | ICD-10-CM | POA: Diagnosis not present

## 2020-10-05 DIAGNOSIS — E538 Deficiency of other specified B group vitamins: Secondary | ICD-10-CM | POA: Diagnosis not present

## 2020-10-05 DIAGNOSIS — F319 Bipolar disorder, unspecified: Secondary | ICD-10-CM | POA: Diagnosis not present

## 2020-10-05 DIAGNOSIS — F0391 Unspecified dementia with behavioral disturbance: Secondary | ICD-10-CM | POA: Diagnosis not present

## 2020-10-05 DIAGNOSIS — I482 Chronic atrial fibrillation, unspecified: Secondary | ICD-10-CM | POA: Diagnosis not present

## 2020-10-05 NOTE — Telephone Encounter (Signed)
Received faxed orders from [facility], marked urgent. Placed in Dr. Lucita Lora inbox in front office.

## 2020-10-05 NOTE — Telephone Encounter (Signed)
Home health orders received 10/04/2020 for [employer] Home health initiation orders: Yes.  Home health re-certification orders: No. Patient last seen by ordering physician for this condition: 09/14/2020. Must be less than 90 days for re-certification and less than 30 days prior for initiation. Visit must have been for the condition the orders are being placed.  Patient meets criteria for Physician to sign orders: Yes.        Current med list has been attached: No        Orders placed on physicians desk for signature: [place]   HH orders completed and placed in [place] work basket. Please ensure charge sheet is attached and appropriate charge is added.    Electronically Signed by: Howard Pouch, DO [place] [facility] [place]

## 2020-10-05 NOTE — Telephone Encounter (Signed)
Spoke with Agapito Games regarding following instructions. She stated that the pt was taken off of troprolol due to hypotension (rx is not on current med list). She was informed of the Ambien and stated wife has been giving him melatonin would like to know if he can also take benadryl. Informed her pt has an upcoming appt and will discuss medication during appt

## 2020-10-05 NOTE — Telephone Encounter (Signed)
Spoke with pt wife to bring ALL pt med and vitamins to next visit

## 2020-10-06 DIAGNOSIS — F411 Generalized anxiety disorder: Secondary | ICD-10-CM | POA: Diagnosis not present

## 2020-10-06 DIAGNOSIS — I10 Essential (primary) hypertension: Secondary | ICD-10-CM | POA: Diagnosis not present

## 2020-10-06 DIAGNOSIS — I482 Chronic atrial fibrillation, unspecified: Secondary | ICD-10-CM | POA: Diagnosis not present

## 2020-10-06 DIAGNOSIS — F319 Bipolar disorder, unspecified: Secondary | ICD-10-CM | POA: Diagnosis not present

## 2020-10-06 DIAGNOSIS — F0391 Unspecified dementia with behavioral disturbance: Secondary | ICD-10-CM | POA: Diagnosis not present

## 2020-10-06 DIAGNOSIS — E039 Hypothyroidism, unspecified: Secondary | ICD-10-CM | POA: Diagnosis not present

## 2020-10-06 DIAGNOSIS — F05 Delirium due to known physiological condition: Secondary | ICD-10-CM | POA: Diagnosis not present

## 2020-10-06 DIAGNOSIS — E538 Deficiency of other specified B group vitamins: Secondary | ICD-10-CM | POA: Diagnosis not present

## 2020-10-06 DIAGNOSIS — E876 Hypokalemia: Secondary | ICD-10-CM | POA: Diagnosis not present

## 2020-10-06 NOTE — Telephone Encounter (Signed)
Urgent referral placed.

## 2020-10-06 NOTE — Addendum Note (Signed)
Addended by: Kavin Leech on: 10/06/2020 10:49 AM   Modules accepted: Orders

## 2020-10-06 NOTE — Telephone Encounter (Signed)
Orders faxed

## 2020-10-06 NOTE — Telephone Encounter (Signed)
Original forms faxed. Duplicate copy

## 2020-10-10 DIAGNOSIS — I10 Essential (primary) hypertension: Secondary | ICD-10-CM | POA: Diagnosis not present

## 2020-10-10 DIAGNOSIS — F411 Generalized anxiety disorder: Secondary | ICD-10-CM | POA: Diagnosis not present

## 2020-10-10 DIAGNOSIS — F319 Bipolar disorder, unspecified: Secondary | ICD-10-CM | POA: Diagnosis not present

## 2020-10-10 DIAGNOSIS — E876 Hypokalemia: Secondary | ICD-10-CM | POA: Diagnosis not present

## 2020-10-10 DIAGNOSIS — F05 Delirium due to known physiological condition: Secondary | ICD-10-CM | POA: Diagnosis not present

## 2020-10-10 DIAGNOSIS — F0391 Unspecified dementia with behavioral disturbance: Secondary | ICD-10-CM | POA: Diagnosis not present

## 2020-10-10 DIAGNOSIS — I482 Chronic atrial fibrillation, unspecified: Secondary | ICD-10-CM | POA: Diagnosis not present

## 2020-10-10 DIAGNOSIS — E538 Deficiency of other specified B group vitamins: Secondary | ICD-10-CM | POA: Diagnosis not present

## 2020-10-10 DIAGNOSIS — E039 Hypothyroidism, unspecified: Secondary | ICD-10-CM | POA: Diagnosis not present

## 2020-10-11 DIAGNOSIS — E039 Hypothyroidism, unspecified: Secondary | ICD-10-CM | POA: Diagnosis not present

## 2020-10-11 DIAGNOSIS — F05 Delirium due to known physiological condition: Secondary | ICD-10-CM | POA: Diagnosis not present

## 2020-10-11 DIAGNOSIS — I10 Essential (primary) hypertension: Secondary | ICD-10-CM | POA: Diagnosis not present

## 2020-10-11 DIAGNOSIS — F411 Generalized anxiety disorder: Secondary | ICD-10-CM | POA: Diagnosis not present

## 2020-10-11 DIAGNOSIS — E538 Deficiency of other specified B group vitamins: Secondary | ICD-10-CM | POA: Diagnosis not present

## 2020-10-11 DIAGNOSIS — I482 Chronic atrial fibrillation, unspecified: Secondary | ICD-10-CM | POA: Diagnosis not present

## 2020-10-11 DIAGNOSIS — E876 Hypokalemia: Secondary | ICD-10-CM | POA: Diagnosis not present

## 2020-10-11 DIAGNOSIS — F319 Bipolar disorder, unspecified: Secondary | ICD-10-CM | POA: Diagnosis not present

## 2020-10-11 DIAGNOSIS — F0391 Unspecified dementia with behavioral disturbance: Secondary | ICD-10-CM | POA: Diagnosis not present

## 2020-10-12 ENCOUNTER — Ambulatory Visit (INDEPENDENT_AMBULATORY_CARE_PROVIDER_SITE_OTHER): Payer: Medicare HMO | Admitting: Family Medicine

## 2020-10-12 ENCOUNTER — Encounter: Payer: Self-pay | Admitting: Family Medicine

## 2020-10-12 ENCOUNTER — Other Ambulatory Visit: Payer: Self-pay

## 2020-10-12 VITALS — BP 119/63 | HR 101 | Temp 98.4°F | Wt 238.0 lb

## 2020-10-12 DIAGNOSIS — F0391 Unspecified dementia with behavioral disturbance: Secondary | ICD-10-CM

## 2020-10-12 DIAGNOSIS — I4891 Unspecified atrial fibrillation: Secondary | ICD-10-CM | POA: Diagnosis not present

## 2020-10-12 DIAGNOSIS — E039 Hypothyroidism, unspecified: Secondary | ICD-10-CM | POA: Diagnosis not present

## 2020-10-12 DIAGNOSIS — G4733 Obstructive sleep apnea (adult) (pediatric): Secondary | ICD-10-CM | POA: Diagnosis not present

## 2020-10-12 DIAGNOSIS — F339 Major depressive disorder, recurrent, unspecified: Secondary | ICD-10-CM | POA: Diagnosis not present

## 2020-10-12 DIAGNOSIS — E119 Type 2 diabetes mellitus without complications: Secondary | ICD-10-CM | POA: Diagnosis not present

## 2020-10-12 DIAGNOSIS — F411 Generalized anxiety disorder: Secondary | ICD-10-CM

## 2020-10-12 DIAGNOSIS — F319 Bipolar disorder, unspecified: Secondary | ICD-10-CM | POA: Diagnosis not present

## 2020-10-12 DIAGNOSIS — I482 Chronic atrial fibrillation, unspecified: Secondary | ICD-10-CM | POA: Diagnosis not present

## 2020-10-12 DIAGNOSIS — F05 Delirium due to known physiological condition: Secondary | ICD-10-CM | POA: Diagnosis not present

## 2020-10-12 DIAGNOSIS — I10 Essential (primary) hypertension: Secondary | ICD-10-CM | POA: Diagnosis not present

## 2020-10-12 DIAGNOSIS — E7800 Pure hypercholesterolemia, unspecified: Secondary | ICD-10-CM

## 2020-10-12 DIAGNOSIS — Z7901 Long term (current) use of anticoagulants: Secondary | ICD-10-CM

## 2020-10-12 DIAGNOSIS — N400 Enlarged prostate without lower urinary tract symptoms: Secondary | ICD-10-CM

## 2020-10-12 DIAGNOSIS — Z6841 Body Mass Index (BMI) 40.0 and over, adult: Secondary | ICD-10-CM

## 2020-10-12 DIAGNOSIS — E66813 Obesity, class 3: Secondary | ICD-10-CM

## 2020-10-12 DIAGNOSIS — E876 Hypokalemia: Secondary | ICD-10-CM

## 2020-10-12 DIAGNOSIS — I700 Atherosclerosis of aorta: Secondary | ICD-10-CM

## 2020-10-12 DIAGNOSIS — Z79899 Other long term (current) drug therapy: Secondary | ICD-10-CM

## 2020-10-12 DIAGNOSIS — I6529 Occlusion and stenosis of unspecified carotid artery: Secondary | ICD-10-CM

## 2020-10-12 DIAGNOSIS — R9431 Abnormal electrocardiogram [ECG] [EKG]: Secondary | ICD-10-CM

## 2020-10-12 DIAGNOSIS — E538 Deficiency of other specified B group vitamins: Secondary | ICD-10-CM | POA: Diagnosis not present

## 2020-10-12 NOTE — Progress Notes (Signed)
[name] Flesher Sr. , October 22, 1950, 70 y.o., male MRN: [id number] Patient Care Team    Relationship Specialty Notifications Start End  [name], DO PCP - General Family Medicine  11/08/17   Troy Sine, MD PCP - Cardiology Cardiology Admissions 02/17/19   [name], MD Consulting Physician Neurology  11/08/17   Clarene Essex, MD Consulting Physician Gastroenterology  11/29/17   Melissa Noon, [place] Referring Physician Optometry  06/30/20   Rod Can, MD Consulting Physician Orthopedic Surgery  06/30/20   Ashley Royalty, [place]  Physician Assistant  06/30/20     Chief Complaint  Patient presents with  . Follow-up     Subjective:  [name] ROSILES Sr.  is a 70 y.o. male presents for chronic condition follow-up. Major depression, recurrent, chronic (HCC)/anxiety/dementia without behavioral disturbances/insomnia Wife states patient is doing very well.  He is tolerating taking the medication regimen despite the amount of pill load.  He is now sleeping well through the night. He is still having many moments of confusion and keeps asking her when are they going home, when they are in their home that they have lived in for many years psych referral in place since 06/2020- denied by Dr.Akhtar and [facility].  They also have not heard from neurology since hospital discharge for his dementia.   BPH: Doing well and condition is stable  Chronic anticoagulation/A. Fib/hyperlipidemia/statin therapy Has not needed to restart medications.  We were called by his home health team who noted he had elevated heart rate.  Since that time his wife has decreased his caffeine consumption and his heart rate has improved greatly.  He is compliant with Eliquis  Hypothyroidism, unspecified type She is compliant with levothyroxine 25 mcg daily  Diabetes mellitus without complication (HCC)/obesity Patient reports compliance with Januvia 25 mg daily.  They are not following a diabetic diet.   No results  for input(s): HGB, HCT, WBC, PLT in the last 168 hours. CMP Latest Ref Rng & Units 09/06/2020 09/05/2020 08/31/2020  Glucose 70 - 99 mg/dL 145(H) 92 100(H)  BUN 8 - 23 mg/dL 7(L) 8 7(L)  Creatinine 0.61 - 1.24 mg/dL 1.05 1.02 0.98  Sodium 135 - 145 mmol/L 139 137 140  Potassium 3.5 - 5.1 mmol/L 4.0 3.4(L) 4.5  Chloride 98 - 111 mmol/L 100 101 102  CO2 22 - 32 mmol/L _0 Calcium 8.9 - 10.3 mg/dL 8.8(L) 8.4(L) 8.9  Total Protein 6.5 - 8.1 g/dL - 5.1(L) 5.4(L)  Total Bilirubin 0.3 - 1.2 mg/dL - 0.9 1.0  Alkaline Phos 38 - 126 U/L - 51 53  AST 15 - 41 U/L - 21 22  ALT 0 - 44 U/L - 10 12    No results found.   Depression screen [facility] 2/9 06/30/2020 12/09/2019 05/26/2019 11/17/2018 05/14/2018  Decreased Interest 3 0 0 0 1  Down, Depressed, Hopeless _1 PHQ - 2 Score _2 Altered sleeping 3 0 _3 Tired, decreased energy _4 0  Change in appetite _5 0 0  Feeling bad or failure about yourself  3 0 _6 Trouble concentrating _7 0 1  Moving slowly or fidgety/restless 2 0 1 0 0  Suicidal thoughts 1 0 0 0 0  PHQ-9 Score _8 Difficult doing work/chores - Not difficult at all Somewhat difficult Somewhat difficult Somewhat difficult  Allergies  Allergen Reactions  . Losartan Potassium-Hctz Diarrhea  . Nsaids     On eliquis.   Marland Kitchen Zoloft [Sertraline Hcl] Diarrhea  . Benzodiazepines Other (See Comments)    Agitation.   Social History   Tobacco Use  . Smoking status: Former Smoker    Packs/day: 1.00    Years: 19.00    Pack years: 19.00    Quit date: 02/25/1982    Years since quitting: 38.6  . Smokeless tobacco: Former Systems developer    Quit date: 1990  Substance Use Topics  . Alcohol use: Yes    Comment: occ   Past Medical History:  Diagnosis Date  . Acute delirium 07/14/2020  . Adenomatous colon polyp 2016  . Allergy   . AMS (altered mental status) 07/13/2020  . Anxiety   . Bell palsy    resolved  . Bell's palsy 09/14/2020  . Chicken pox   .  Concussion with no loss of consciousness 07/07/2018  . Depression   . Diet-controlled diabetes mellitus ([place])   . Eating disorder   . Essential hypertension 11/29/2017  . Gastro-esophageal reflux disease without esophagitis 09/14/2020  . GERD (gastroesophageal reflux disease)   . History of vitamin D deficiency   . Hyperlipidemia   . Hypertension   . Hypokalemia   . Memory loss   . MI (myocardial infarction) ([place])    per pt   . Neoplasm of uncertain behavior of skin 09/14/2020  . Ulcerative colitis ([place]) 2005-2006   severe    Past Surgical History:  Procedure Laterality Date  . COLONOSCOPY WITH PROPOFOL N/A 07/15/2014   Procedure: COLONOSCOPY WITH PROPOFOL;  Surgeon: [name], MD;  Location: [facility];  Service: Endoscopy;  Laterality: N/A;  . COLONOSCOPY WITH PROPOFOL N/A 08/22/2015   Procedure: COLONOSCOPY WITH PROPOFOL;  Surgeon: [name], MD;  Location: [facility];  Service: Endoscopy;  Laterality: N/A;  . ORIF ANKLE FRACTURE  02/27/2012   Procedure: OPEN REDUCTION INTERNAL FIXATION (ORIF) ANKLE FRACTURE;  Surgeon: Colin Rhein, MD;  Location: [facility];  Service: Orthopedics;  Laterality: Left;  ORIF left bimalleolar ankle fracture  . SHOULDER SURGERY     LEFT  . SIGMOIDOSCOPY  10/08/2018   Externa and Internal Hmorrhoids. Tubular and Tublovillous adenoma removed from transverse colon. Inflammatory pseudopolyps, negative for dysplasia  . TONSILLECTOMY    . UPPER GASTROINTESTINAL ENDOSCOPY     Family History  Problem Relation Age of Onset  . Dementia Mother   . Hypertension Mother   . Early death Father 35       drowning   . Post-traumatic stress disorder Brother   . Neuropathy Brother    Allergies as of 10/12/2020      Reactions   Losartan Potassium-hctz Diarrhea   Nsaids    On eliquis.    Zoloft [sertraline Hcl] Diarrhea   Benzodiazepines Other (See Comments)   Agitation.      Medication List       Accurate as of October 12, 2020  1:22 PM. If you have any questions, ask your nurse or doctor.        acetaminophen 325 MG tablet Commonly known as: TYLENOL Take 650 mg by mouth every 6 (six) hours as needed.   apixaban 5 MG Tabs tablet Commonly known as: ELIQUIS Take 1 tablet (5 mg total) by mouth 2 (two) times daily.   atorvastatin 20 MG tablet Commonly known as: LIPITOR Take 1 tablet (20 mg total) by mouth daily.  azaTHIOprine 50 MG tablet Commonly known as: IMURAN Take 200 mg by mouth every morning.   Cyanocobalamin 2500 MCG Tabs 1 tab daily po   diphenhydrAMINE 25 mg capsule Commonly known as: BENADRYL Take 25 mg by mouth at bedtime. For sleep   divalproex 125 MG capsule Commonly known as: DEPAKOTE SPRINKLE Take 6 capsules (750 mg total) by mouth every 12 (twelve) hours.   DULoxetine 60 MG capsule Commonly known as: CYMBALTA Take 1 capsule (60 mg total) by mouth daily.   levothyroxine 25 MCG tablet Commonly known as: Synthroid Take 1 tablet (25 mcg total) by mouth daily before breakfast.   Melatonin 10 MG Tabs Take by mouth.   OLANZapine 10 MG tablet Commonly known as: ZYPREXA 1/2 tab in the morning and 1.5 tabs QHS   potassium chloride 10 MEQ tablet Commonly known as: Klor-Con M10 Take 1 tablet (10 mEq total) by mouth daily.   sitaGLIPtin 25 MG tablet Commonly known as: Januvia Take 1 tablet (25 mg total) by mouth daily.   tamsulosin 0.4 MG Caps capsule Commonly known as: FLOMAX Take 1 capsule (0.4 mg total) by mouth daily.   vitamin D3 50 MCG (2000 UT) Caps Take by mouth.       All past medical history, surgical history, allergies, family history, immunizations and medications were updated in the EMR today and reviewed under the history and medication portions of their EMR.      ROS: Negative, with the exception of above mentioned in HPI   Objective:  BP 119/63   Pulse (!) 101   Temp 98.4 F (36.9 C) (Oral)   Wt 238 lb (108 kg)   SpO2 98%   BMI 37.28 kg/m   Body mass index is 37.28 kg/m. Gen: Afebrile. No acute distress.  Nontoxic in appearance.  Pleasant male with obvious cognitive decline HENT: AT. [place].  No cough or hoarseness Eyes:Pupils Equal Round Reactive to light, Extraocular movements intact,  Conjunctiva without redness, discharge or icterus. CV: RRR, no edema, +2/4 P posterior tibialis pulses Chest: CTAB, no wheeze or crackles Skin: No rashes, purpura or petechiae.  Neuro:  Normal gait. PERLA. EOMi. Alert. Oriented to person place. Psych: Normal affect, dress and demeanor. Normal speech. Normal thought content and judgment.   Assessment/Plan: [name] Flesher Sr. is a 70 y.o. male present for OV for Hospital discharge follow up Major depression, recurrent, chronic (HCC)/anxiety/dementia without behavioral disturbances/insomnia Patient is stable.  Wife states that he is doing well on current regimen.  Encouraged melatonin use and maintaining a regular sleep, eat, wake schedule etc. Continue Zyprexa 5 mg in the morning and 15 mg before bed> refilled for them today to last until able to get into his new psychiatry team which will take over management Continue Depakote 750 mg twice daily> refilled for them today to last until able to get into his new psychiatry team which take over management Depakote levels are in the therapeutic range Continue Cymbalta 60 mg daily -Patient was provided with  [facility] information to contact for establishment.    - psych referral in place since 06/2020- denied by [facility].  -Neurology referral placed for his dementia. QT prolongation-precautions on medication choices   BPH: Table on Flomax 0.4 mg daily.  Chronic anticoagulation/A. Fib/hyperlipidemia/statin therapy Stable.  Heart rate better since stopping caffeine. Blood pressure and rate controlled today.  We will not start back metoprolol at this time. Continue Eliquis 5 mg twice daily Continue Lipitor 20 mg daily  Hypothyroidism,  unspecified type Continue levothyroxine 25 mcg daily.  TSH in range  Vitamin B 12 deficiency Encouraged him to continue B12 supplementation  Diabetes mellitus without complication (HCC)/obesity A1c is 5.0 on Januvia 25 mg daily. Encouraged wife to decrease to Januvia 12.5 mg daily.    Reviewed expectations re: course of current medical issues.  Discussed self-management of symptoms.  Outlined signs and symptoms indicating need for more acute intervention.  Patient verbalized understanding and all questions were answered.  Patient received an After-Visit Summary.  Any changes in medications were reviewed and patient was provided with updated med list with their AVS.      Orders Placed This Encounter  Procedures  . TSH  . CBC  . Comp Met (CMET)  . T4, free  . Valproic Acid level  . Hemoglobin A1c   No orders of the defined types were placed in this encounter.   Note is dictated utilizing voice recognition software. Although note has been proof read prior to signing, occasional typographical errors still can be missed. If any questions arise, please do not hesitate to call for verification.   electronically signed by:  Howard Pouch, DO  [place]

## 2020-10-12 NOTE — Patient Instructions (Signed)
We will call you with lab results and guide you further on medications and follow up.  Ok for occasional sugar - but try to use sugar free snacks when able.

## 2020-10-14 ENCOUNTER — Encounter: Payer: Self-pay | Admitting: Neurology

## 2020-10-14 ENCOUNTER — Telehealth: Payer: Self-pay | Admitting: Family Medicine

## 2020-10-14 DIAGNOSIS — F0390 Unspecified dementia without behavioral disturbance: Secondary | ICD-10-CM

## 2020-10-14 LAB — CBC
HCT: 40.5 % (ref 38.5–50.0)
Hemoglobin: 13.6 g/dL (ref 13.2–17.1)
MCH: 34.7 pg — ABNORMAL HIGH (ref 27.0–33.0)
MCHC: 33.6 g/dL (ref 32.0–36.0)
MCV: 103.3 fL — ABNORMAL HIGH (ref 80.0–100.0)
MPV: 10.8 fL (ref 7.5–12.5)
Platelets: 224 10*3/uL (ref 140–400)
RBC: 3.92 10*6/uL — ABNORMAL LOW (ref 4.20–5.80)
RDW: 16.8 % — ABNORMAL HIGH (ref 11.0–15.0)
WBC: 5.8 10*3/uL (ref 3.8–10.8)

## 2020-10-14 LAB — VALPROIC ACID LEVEL: Valproic Acid Lvl: 70.7 mg/L (ref 50.0–100.0)

## 2020-10-14 LAB — HEMOGLOBIN A1C
Hgb A1c MFr Bld: 5 % of total Hgb (ref <5.7)
Mean Plasma Glucose: 97 mg/dL
eAG (mmol/L): 5.4 mmol/L

## 2020-10-14 LAB — T4, FREE: Free T4: 0.9 ng/dL (ref 0.8–1.8)

## 2020-10-14 LAB — COMPREHENSIVE METABOLIC PANEL
AG Ratio: 1.9 (calc) (ref 1.0–2.5)
ALT: 4 U/L — ABNORMAL LOW (ref 9–46)
AST: 13 U/L (ref 10–35)
Albumin: 3.6 g/dL (ref 3.6–5.1)
Alkaline phosphatase (APISO): 49 U/L (ref 35–144)
BUN/Creatinine Ratio: 8 (calc) (ref 6–22)
BUN: 6 mg/dL — ABNORMAL LOW (ref 7–25)
CO2: 27 mmol/L (ref 20–32)
Calcium: 8.8 mg/dL (ref 8.6–10.3)
Chloride: 98 mmol/L (ref 98–110)
Creat: 0.8 mg/dL (ref 0.70–1.18)
Globulin: 1.9 g/dL (calc) (ref 1.9–3.7)
Glucose, Bld: 101 mg/dL — ABNORMAL HIGH (ref 65–99)
Potassium: 4.1 mmol/L (ref 3.5–5.3)
Sodium: 138 mmol/L (ref 135–146)
Total Bilirubin: 0.6 mg/dL (ref 0.2–1.2)
Total Protein: 5.5 g/dL — ABNORMAL LOW (ref 6.1–8.1)

## 2020-10-14 LAB — TSH: TSH: 3.97 mIU/L (ref 0.40–4.50)

## 2020-10-14 MED ORDER — OLANZAPINE 10 MG PO TABS: ORAL_TABLET | ORAL | 0 refills | Status: DC

## 2020-10-14 MED ORDER — LEVOTHYROXINE SODIUM 25 MCG PO TABS: 25.0000 ug | ORAL_TABLET | Freq: Every day | ORAL | 3 refills | Status: DC

## 2020-10-14 MED ORDER — SITAGLIPTIN PHOSPHATE 25 MG PO TABS: 12.5000 mg | ORAL_TABLET | Freq: Every day | ORAL | 1 refills | Status: DC

## 2020-10-14 NOTE — Telephone Encounter (Signed)
Please inform patient's wife Liver, kidney and thyroid function are normal Blood cell counts and electrolytes are normal His A1c looks great at 5.0.  As far as the Depakote sprinkles versus other forms of Depakote, I would keep this same for now.  Appears to be advised on the formulary for cost.  diabetes: decrease the Januvia to half a tab daily since his A1c is looking much better.   I have placed a referral to a new neurologist.  That office should be calling him to get him scheduled for an appointment for management of his dementia.   Psychiatry: Please give him the contact information for [facility] and explained process to them.       Follow-up 3 months, sooner if needed.

## 2020-10-14 NOTE — Telephone Encounter (Signed)
Received faxed response from [facility] stating they are unable to see this patient because #1 They do not treat that level of care or diagnosis and #2 they do not have availability for Emergent Care.

## 2020-10-14 NOTE — Telephone Encounter (Signed)
Awaiting a further explanation from office regarding denial

## 2020-10-14 NOTE — Telephone Encounter (Signed)
Please advise of next step

## 2020-10-14 NOTE — Telephone Encounter (Signed)
LVM for pt to CB regarding results.  

## 2020-10-19 DIAGNOSIS — F411 Generalized anxiety disorder: Secondary | ICD-10-CM | POA: Diagnosis not present

## 2020-10-19 DIAGNOSIS — E538 Deficiency of other specified B group vitamins: Secondary | ICD-10-CM | POA: Diagnosis not present

## 2020-10-19 DIAGNOSIS — E876 Hypokalemia: Secondary | ICD-10-CM | POA: Diagnosis not present

## 2020-10-19 DIAGNOSIS — I482 Chronic atrial fibrillation, unspecified: Secondary | ICD-10-CM | POA: Diagnosis not present

## 2020-10-19 DIAGNOSIS — F05 Delirium due to known physiological condition: Secondary | ICD-10-CM | POA: Diagnosis not present

## 2020-10-19 DIAGNOSIS — E039 Hypothyroidism, unspecified: Secondary | ICD-10-CM | POA: Diagnosis not present

## 2020-10-19 DIAGNOSIS — F319 Bipolar disorder, unspecified: Secondary | ICD-10-CM | POA: Diagnosis not present

## 2020-10-19 DIAGNOSIS — F0391 Unspecified dementia with behavioral disturbance: Secondary | ICD-10-CM | POA: Diagnosis not present

## 2020-10-19 DIAGNOSIS — I10 Essential (primary) hypertension: Secondary | ICD-10-CM | POA: Diagnosis not present

## 2020-10-20 ENCOUNTER — Telehealth: Payer: Self-pay | Admitting: Family Medicine

## 2020-10-20 NOTE — Telephone Encounter (Signed)
Form received. Placing on providers desk

## 2020-10-20 NOTE — Telephone Encounter (Signed)
Awaiting forms

## 2020-10-20 NOTE — Telephone Encounter (Signed)
Received faxed orders from [facility]. Placed in Dr. Lucita Lora inbox in front office to be signed.

## 2020-10-21 ENCOUNTER — Telehealth: Payer: Self-pay

## 2020-10-21 NOTE — Telephone Encounter (Signed)
Form faxed

## 2020-10-21 NOTE — Telephone Encounter (Signed)
Completed and returned to [place] work [employer]

## 2020-10-21 NOTE — Telephone Encounter (Signed)
[name] calling from [facility] requesting an extension on occupational therapy visits.  Her plan of care is as requested below   X 1 day a week for x 3 weeks to start on 10/24/20   [name] can be reached at [phone].

## 2020-10-21 NOTE — Telephone Encounter (Signed)
V/o given

## 2020-10-21 NOTE — Telephone Encounter (Signed)
Please advise on v/o

## 2020-10-21 NOTE — Telephone Encounter (Signed)
Extension is fine

## 2020-10-22 DIAGNOSIS — R4182 Altered mental status, unspecified: Secondary | ICD-10-CM | POA: Diagnosis not present

## 2020-10-24 ENCOUNTER — Telehealth: Payer: Self-pay | Admitting: Family Medicine

## 2020-10-24 DIAGNOSIS — F339 Major depressive disorder, recurrent, unspecified: Secondary | ICD-10-CM | POA: Diagnosis not present

## 2020-10-24 NOTE — Telephone Encounter (Signed)
Please advise if any instructions can be given

## 2020-10-24 NOTE — Telephone Encounter (Signed)
If his symptoms are worsening she may have to take him to the ED again for [facility]- admission. I know this is disheartening for them both. I wish we had more options- but we are limited until he is able to establish with new neurologist and psychiatry.   One option for helping keeping him in the house is door locks at the top of the door that slide up. It is common households with dementia pts need to install locks like these to keep folks safe and in the home.   When is their appt at [facility]? Can we reach out to  neurology and see if he can be seen urgently - they scheduled over 3 months out?   Thanks.

## 2020-10-24 NOTE — Telephone Encounter (Signed)
Spoke with wifer, Barbera Setters, who stated that pt has been resting today. Pt is scheduled for [facility] 3/09 and Neuro 5/10.  Spoke with Santiago Glad to have referral to Neuro change to urgent.   FYI

## 2020-10-24 NOTE — Telephone Encounter (Signed)
Patient's wife states his dementia is worsening. He leaves the house in the middle of the night. He puts random things in his pocket. He urinates on the carpet. Recent urgent care visit showed no UTI. Mrs. Beller was hesitant to make appointment, she prefers phone call just to tell her how to handle current situation.

## 2020-10-26 DIAGNOSIS — F05 Delirium due to known physiological condition: Secondary | ICD-10-CM | POA: Diagnosis not present

## 2020-10-26 DIAGNOSIS — I10 Essential (primary) hypertension: Secondary | ICD-10-CM | POA: Diagnosis not present

## 2020-10-26 DIAGNOSIS — F411 Generalized anxiety disorder: Secondary | ICD-10-CM | POA: Diagnosis not present

## 2020-10-26 DIAGNOSIS — E538 Deficiency of other specified B group vitamins: Secondary | ICD-10-CM | POA: Diagnosis not present

## 2020-10-26 DIAGNOSIS — F319 Bipolar disorder, unspecified: Secondary | ICD-10-CM | POA: Diagnosis not present

## 2020-10-26 DIAGNOSIS — I482 Chronic atrial fibrillation, unspecified: Secondary | ICD-10-CM | POA: Diagnosis not present

## 2020-10-26 DIAGNOSIS — E876 Hypokalemia: Secondary | ICD-10-CM | POA: Diagnosis not present

## 2020-10-26 DIAGNOSIS — F0391 Unspecified dementia with behavioral disturbance: Secondary | ICD-10-CM | POA: Diagnosis not present

## 2020-10-26 DIAGNOSIS — E039 Hypothyroidism, unspecified: Secondary | ICD-10-CM | POA: Diagnosis not present

## 2020-10-27 DIAGNOSIS — F411 Generalized anxiety disorder: Secondary | ICD-10-CM | POA: Diagnosis not present

## 2020-10-27 DIAGNOSIS — F319 Bipolar disorder, unspecified: Secondary | ICD-10-CM | POA: Diagnosis not present

## 2020-10-27 DIAGNOSIS — F0391 Unspecified dementia with behavioral disturbance: Secondary | ICD-10-CM | POA: Diagnosis not present

## 2020-10-27 DIAGNOSIS — E538 Deficiency of other specified B group vitamins: Secondary | ICD-10-CM | POA: Diagnosis not present

## 2020-10-27 DIAGNOSIS — I10 Essential (primary) hypertension: Secondary | ICD-10-CM | POA: Diagnosis not present

## 2020-10-27 DIAGNOSIS — I482 Chronic atrial fibrillation, unspecified: Secondary | ICD-10-CM | POA: Diagnosis not present

## 2020-10-27 DIAGNOSIS — F05 Delirium due to known physiological condition: Secondary | ICD-10-CM | POA: Diagnosis not present

## 2020-10-27 DIAGNOSIS — E876 Hypokalemia: Secondary | ICD-10-CM | POA: Diagnosis not present

## 2020-10-27 DIAGNOSIS — E039 Hypothyroidism, unspecified: Secondary | ICD-10-CM | POA: Diagnosis not present

## 2020-11-01 ENCOUNTER — Telehealth: Payer: Self-pay | Admitting: Family Medicine

## 2020-11-01 DIAGNOSIS — F319 Bipolar disorder, unspecified: Secondary | ICD-10-CM | POA: Diagnosis not present

## 2020-11-01 DIAGNOSIS — I10 Essential (primary) hypertension: Secondary | ICD-10-CM | POA: Diagnosis not present

## 2020-11-01 DIAGNOSIS — E538 Deficiency of other specified B group vitamins: Secondary | ICD-10-CM | POA: Diagnosis not present

## 2020-11-01 DIAGNOSIS — F0391 Unspecified dementia with behavioral disturbance: Secondary | ICD-10-CM | POA: Diagnosis not present

## 2020-11-01 DIAGNOSIS — F411 Generalized anxiety disorder: Secondary | ICD-10-CM | POA: Diagnosis not present

## 2020-11-01 DIAGNOSIS — I482 Chronic atrial fibrillation, unspecified: Secondary | ICD-10-CM | POA: Diagnosis not present

## 2020-11-01 DIAGNOSIS — F05 Delirium due to known physiological condition: Secondary | ICD-10-CM | POA: Diagnosis not present

## 2020-11-01 DIAGNOSIS — E039 Hypothyroidism, unspecified: Secondary | ICD-10-CM | POA: Diagnosis not present

## 2020-11-01 DIAGNOSIS — E876 Hypokalemia: Secondary | ICD-10-CM | POA: Diagnosis not present

## 2020-11-01 NOTE — Telephone Encounter (Signed)
FYI

## 2020-11-01 NOTE — Telephone Encounter (Signed)
[name] Level with [facility] health called with the following vital sign alert for the patient: BP elevated at 148/92, 11/01/20 1:00 pm.

## 2020-11-01 NOTE — Telephone Encounter (Signed)
Noted.  No further intervention needed at this time.

## 2020-11-02 DIAGNOSIS — E876 Hypokalemia: Secondary | ICD-10-CM | POA: Diagnosis not present

## 2020-11-02 DIAGNOSIS — E538 Deficiency of other specified B group vitamins: Secondary | ICD-10-CM | POA: Diagnosis not present

## 2020-11-02 DIAGNOSIS — F05 Delirium due to known physiological condition: Secondary | ICD-10-CM | POA: Diagnosis not present

## 2020-11-02 DIAGNOSIS — I482 Chronic atrial fibrillation, unspecified: Secondary | ICD-10-CM | POA: Diagnosis not present

## 2020-11-02 DIAGNOSIS — I10 Essential (primary) hypertension: Secondary | ICD-10-CM | POA: Diagnosis not present

## 2020-11-02 DIAGNOSIS — F319 Bipolar disorder, unspecified: Secondary | ICD-10-CM | POA: Diagnosis not present

## 2020-11-02 DIAGNOSIS — F0391 Unspecified dementia with behavioral disturbance: Secondary | ICD-10-CM | POA: Diagnosis not present

## 2020-11-02 DIAGNOSIS — E039 Hypothyroidism, unspecified: Secondary | ICD-10-CM | POA: Diagnosis not present

## 2020-11-02 DIAGNOSIS — F411 Generalized anxiety disorder: Secondary | ICD-10-CM | POA: Diagnosis not present

## 2020-11-04 ENCOUNTER — Telehealth: Payer: Self-pay | Admitting: Family Medicine

## 2020-11-04 NOTE — Telephone Encounter (Signed)
Received faxed orders from [facility] to "DC Ambien." Placed in Dr. Lucita Lora inbox in front office to be signed.

## 2020-11-07 DIAGNOSIS — F0391 Unspecified dementia with behavioral disturbance: Secondary | ICD-10-CM | POA: Diagnosis not present

## 2020-11-07 DIAGNOSIS — E039 Hypothyroidism, unspecified: Secondary | ICD-10-CM | POA: Diagnosis not present

## 2020-11-07 DIAGNOSIS — E538 Deficiency of other specified B group vitamins: Secondary | ICD-10-CM | POA: Diagnosis not present

## 2020-11-07 DIAGNOSIS — F319 Bipolar disorder, unspecified: Secondary | ICD-10-CM | POA: Diagnosis not present

## 2020-11-07 DIAGNOSIS — F411 Generalized anxiety disorder: Secondary | ICD-10-CM | POA: Diagnosis not present

## 2020-11-07 DIAGNOSIS — E876 Hypokalemia: Secondary | ICD-10-CM | POA: Diagnosis not present

## 2020-11-07 DIAGNOSIS — F05 Delirium due to known physiological condition: Secondary | ICD-10-CM | POA: Diagnosis not present

## 2020-11-07 DIAGNOSIS — I10 Essential (primary) hypertension: Secondary | ICD-10-CM | POA: Diagnosis not present

## 2020-11-07 DIAGNOSIS — I482 Chronic atrial fibrillation, unspecified: Secondary | ICD-10-CM | POA: Diagnosis not present

## 2020-11-07 NOTE — Telephone Encounter (Signed)
Home health orders received 11/04/20 for [employer] Home health initiation orders: No.  Home health re-certification orders: No. Patient last seen by ordering physician for this condition: 10/12/20. Must be less than 90 days for re-certification and less than 30 days prior for initiation. Visit must have been for the condition the orders are being placed.  Patient meets criteria for Physician to sign orders: Yes.        Current med list has been attached: No        Orders placed on physicians desk for signature: 11/07/20 (date) If patient does not meet criteria for orders to be signed: pt was called to schedule appt. Appt is scheduled for n/a.   Orders for medication D/C  Octaviano Glow

## 2020-11-07 NOTE — Telephone Encounter (Signed)
Called [facility] in regards to medication order and provider following information. Pt is no longer under their management

## 2020-11-07 NOTE — Telephone Encounter (Signed)
Ambien was discontinued after his hospital discharge.  This medication has been discontinued for a few months

## 2020-11-09 NOTE — Telephone Encounter (Signed)
Will discard orders once recieved

## 2020-11-09 NOTE — Telephone Encounter (Signed)
Received faxed orders from [facility] to [place]. Placed in Dr. Lucita Lora inbox in front office to be signed.

## 2020-11-10 NOTE — Telephone Encounter (Signed)
Home health orders received for [name]. Home health initiation orders: No.  Home health re-certification orders: Yes. Patient last seen by ordering physician for this condition: acute delirium. Must be less than 90 days for re-certification and less than 30 days prior for initiation. Visit must have been for the condition the orders are being placed.  Patient meets criteria for Physician to sign orders: Yes.        Current med list has been attached: No        Orders placed on physicians desk for signature: 11/10/20 (date) If patient does not meet criteria for orders to be signed: pt was called to schedule appt. Appt is scheduled for n/a.    Home health orders for prev. V/o placed on providers desk. Pt was last seen in office for f/u and is sched in May for next [facility] f/u.   Kavin Leech

## 2020-11-10 NOTE — Telephone Encounter (Signed)
Forms faxed

## 2020-11-10 NOTE — Telephone Encounter (Signed)
Form faxed stated rx not prescribed by [place] and was not taking

## 2020-11-10 NOTE — Telephone Encounter (Signed)
Completed and returned to [place] work basket to fax.

## 2020-11-18 DIAGNOSIS — F339 Major depressive disorder, recurrent, unspecified: Secondary | ICD-10-CM | POA: Diagnosis not present

## 2020-11-29 ENCOUNTER — Telehealth: Payer: Self-pay | Admitting: Family Medicine

## 2020-11-29 NOTE — Telephone Encounter (Signed)
Already faxed form in past stating pt is not taking rx

## 2020-11-29 NOTE — Telephone Encounter (Signed)
Received faxed orders from [facility]. Placed in Dr. Lucita Lora inbox in front office to be signed.

## 2020-12-07 ENCOUNTER — Encounter: Payer: Self-pay | Admitting: Family Medicine

## 2020-12-07 ENCOUNTER — Other Ambulatory Visit: Payer: Self-pay

## 2020-12-07 ENCOUNTER — Ambulatory Visit (INDEPENDENT_AMBULATORY_CARE_PROVIDER_SITE_OTHER): Payer: Medicare HMO | Admitting: Family Medicine

## 2020-12-07 VITALS — BP 78/42 | HR 66 | Temp 98.3°F | Ht 67.0 in | Wt 248.0 lb

## 2020-12-07 DIAGNOSIS — Z6841 Body Mass Index (BMI) 40.0 and over, adult: Secondary | ICD-10-CM | POA: Diagnosis not present

## 2020-12-07 DIAGNOSIS — F411 Generalized anxiety disorder: Secondary | ICD-10-CM | POA: Diagnosis not present

## 2020-12-07 DIAGNOSIS — F0390 Unspecified dementia without behavioral disturbance: Secondary | ICD-10-CM | POA: Diagnosis not present

## 2020-12-07 DIAGNOSIS — E038 Other specified hypothyroidism: Secondary | ICD-10-CM

## 2020-12-07 DIAGNOSIS — R600 Localized edema: Secondary | ICD-10-CM | POA: Diagnosis not present

## 2020-12-07 DIAGNOSIS — S99922A Unspecified injury of left foot, initial encounter: Secondary | ICD-10-CM

## 2020-12-07 DIAGNOSIS — E063 Autoimmune thyroiditis: Secondary | ICD-10-CM

## 2020-12-07 DIAGNOSIS — Z79899 Other long term (current) drug therapy: Secondary | ICD-10-CM

## 2020-12-07 DIAGNOSIS — E876 Hypokalemia: Secondary | ICD-10-CM

## 2020-12-07 DIAGNOSIS — G4733 Obstructive sleep apnea (adult) (pediatric): Secondary | ICD-10-CM

## 2020-12-07 DIAGNOSIS — I700 Atherosclerosis of aorta: Secondary | ICD-10-CM

## 2020-12-07 DIAGNOSIS — G4700 Insomnia, unspecified: Secondary | ICD-10-CM | POA: Diagnosis not present

## 2020-12-07 DIAGNOSIS — N3944 Nocturnal enuresis: Secondary | ICD-10-CM

## 2020-12-07 DIAGNOSIS — R9431 Abnormal electrocardiogram [ECG] [EKG]: Secondary | ICD-10-CM

## 2020-12-07 DIAGNOSIS — I6529 Occlusion and stenosis of unspecified carotid artery: Secondary | ICD-10-CM

## 2020-12-07 DIAGNOSIS — F339 Major depressive disorder, recurrent, unspecified: Secondary | ICD-10-CM | POA: Diagnosis not present

## 2020-12-07 DIAGNOSIS — I4891 Unspecified atrial fibrillation: Secondary | ICD-10-CM

## 2020-12-07 DIAGNOSIS — Z7901 Long term (current) use of anticoagulants: Secondary | ICD-10-CM

## 2020-12-07 DIAGNOSIS — E119 Type 2 diabetes mellitus without complications: Secondary | ICD-10-CM

## 2020-12-07 DIAGNOSIS — E7800 Pure hypercholesterolemia, unspecified: Secondary | ICD-10-CM

## 2020-12-07 DIAGNOSIS — I959 Hypotension, unspecified: Secondary | ICD-10-CM

## 2020-12-07 LAB — COMPREHENSIVE METABOLIC PANEL
ALT: 5 U/L (ref 0–53)
AST: 14 U/L (ref 0–37)
Albumin: 4 g/dL (ref 3.5–5.2)
Alkaline Phosphatase: 36 U/L — ABNORMAL LOW (ref 39–117)
BUN: 9 mg/dL (ref 6–23)
CO2: 31 mEq/L (ref 19–32)
Calcium: 9.1 mg/dL (ref 8.4–10.5)
Chloride: 97 mEq/L (ref 96–112)
Creatinine, Ser: 0.91 mg/dL (ref 0.40–1.50)
GFR: 84.7 mL/min (ref >60.00)
Glucose, Bld: 86 mg/dL (ref 70–99)
Potassium: 4.1 mEq/L (ref 3.5–5.1)
Sodium: 137 mEq/L (ref 135–145)
Total Bilirubin: 0.7 mg/dL (ref 0.2–1.2)
Total Protein: 6.1 g/dL (ref 6.0–8.3)

## 2020-12-07 LAB — CBC
HCT: 41.2 % (ref 39.0–52.0)
Hemoglobin: 14.2 g/dL (ref 13.0–17.0)
MCHC: 34.6 g/dL (ref 30.0–36.0)
MCV: 102.6 fl — ABNORMAL HIGH (ref 78.0–100.0)
Platelets: 162 10*3/uL (ref 150.0–400.0)
RBC: 4.01 Mil/uL — ABNORMAL LOW (ref 4.22–5.81)
RDW: 17.4 % — ABNORMAL HIGH (ref 11.5–15.5)
WBC: 6.5 10*3/uL (ref 4.0–10.5)

## 2020-12-07 LAB — HEMOGLOBIN A1C: Hgb A1c MFr Bld: 5.3 % (ref 4.6–6.5)

## 2020-12-07 LAB — BRAIN NATRIURETIC PEPTIDE: Pro B Natriuretic peptide (BNP): 87 pg/mL (ref 0.0–100.0)

## 2020-12-07 LAB — TSH: TSH: 6.08 u[IU]/mL — ABNORMAL HIGH (ref 0.35–4.50)

## 2020-12-07 MED ORDER — CEFDINIR 300 MG PO CAPS: 300.0000 mg | ORAL_CAPSULE | Freq: Two times a day (BID) | ORAL | 0 refills | Status: DC

## 2020-12-07 NOTE — Patient Instructions (Signed)
Start omnicef 300 mg every 12 hours.  Cut back to 1/2 tab of namenda and discuss discontinuing med secondary to side effects.    I am placing referral to social worker to discuss options and support groups.

## 2020-12-07 NOTE — Progress Notes (Signed)
[name] Flesher Sr. , 07/07/1951, 70 y.o., male MRN: [id number] Patient Care Team    Relationship Specialty Notifications Start End  [name], DO PCP - General Family Medicine  11/08/17   Troy Sine, MD PCP - Cardiology Cardiology Admissions 02/17/19   [name], MD Consulting Physician Neurology  11/08/17   Clarene Essex, MD Consulting Physician Gastroenterology  11/29/17   Melissa Noon, [place] Referring Physician Optometry  06/30/20   Rod Can, MD Consulting Physician Orthopedic Surgery  06/30/20   Ashley Royalty, [place]  Physician Assistant  06/30/20   Florance, Tomasa Blase, [facility] Management   12/09/20     Chief Complaint  Patient presents with  . Follow-up    Pt is not fasting;  Pt has est with [facility], but no with neuro  . Foot Injury    Pt injured left foot pain with swelling first noticed yesterday     Subjective:  [name] Flesher Sr.  is a 70 y.o. male presents for chronic condition follow-up. Major depression, recurrent, chronic (HCC)/anxiety/dementia without behavioral disturbances/insomnia Since wife reports she is starting to feel overwhelmed.  She is having difficulty managing her husband's dementia.  He has started to wander.  She had and still walks on the home to prevent him from leaving unassisted.  She states he left once and he was found walking down the side of the road.  She feels his dementia is worsening over the last month.  She did have an appointment with psychiatry when they started him on Namenda tapering.  His wife states she does not think it is helping, and if anything it is making his symptoms worse and he is hallucinating.  He has had urinary incontinence since starting this medication and she feels his legs have become more swollen.  She states she cannot seem to get him to get up out of the chair and do much of anything anymore (for himself).  Prior note: Wife states patient is doing very well.  He is  tolerating taking the medication regimen despite the amount of pill load.  He is now sleeping well through the night. He is still having many moments of confusion and keeps asking her when are they going home, when they are in their home that they have lived in for many years psych referral in place since 06/2020- denied by Dr.Akhtar and [facility].  They also have not heard from neurology since hospital discharge for his dementia.   BPH/urinary incontinence: States that he has been doing well on the Flomax as far as his BPH goes.  Over the last week she has been noticing urinary incontinence.  She states he has been wetting the bed throughout the night.  She has also noticed his dementia seems to be progressively worsening over the last 2 weeks.  Chronic anticoagulation/A. Fib/hyperlipidemia/statin therapy/hypotension Has not needed to restart medications for his A. fib.  He does have hypotension today.  He has been compliant with his Eliquis.  Hypothyroidism, unspecified type He has been compliant  with levothyroxine 25 mcg daily  Diabetes mellitus without complication (HCC)/obesity Patient reports compliance with Januvia 12.5 mg daily.  They are not following a diabetic diet.  Left large toe injury: Patient his wife states she noticed his large toe was bleeding around the cuticle.  She states he did not complain of any injury.  Patient reports it is not tender.  Recent Labs  Lab 12/07/20 1223  HGB  14.2  HCT 41.2  WBC 6.5  PLT 162.0   CMP Latest Ref Rng & Units 12/07/2020 10/12/2020 09/06/2020  Glucose 70 - 99 mg/dL 86 101(H) 145(H)  BUN 6 - 23 mg/dL 9 6(L) 7(L)  Creatinine 0.40 - 1.50 mg/dL 0.91 0.80 1.05  Sodium 135 - 145 mEq/L 137 138 139  Potassium 3.5 - 5.1 mEq/L 4.1 4.1 4.0  Chloride 96 - 112 mEq/L 97 98 100  CO2 19 - 32 mEq/L 31 27 29   Calcium 8.4 - 10.5 mg/dL 9.1 8.8 8.8(L)  Total Protein 6.0 - 8.3 g/dL 6.1 5.5(L) -  Total Bilirubin 0.2 - 1.2 mg/dL 0.7 0.6 -  Alkaline  Phos 39 - 117 U/L 36(L) - -  AST 0 - 37 U/L 14 13 -  ALT 0 - 53 U/L 5 4(L) -    No results found.   Depression screen [facility] 2/9 06/30/2020 12/09/2019 05/26/2019 11/17/2018 05/14/2018  Decreased Interest 3 0 0 0 1  Down, Depressed, Hopeless 3 1 1 1 3   PHQ - 2 Score 6 1 1 1 4   Altered sleeping 3 0 1 1 2   Tired, decreased energy 2 2 1 1  0  Change in appetite 3 1 1  0 0  Feeling bad or failure about yourself  3 0 1 1 1   Trouble concentrating 1 1 1  0 1  Moving slowly or fidgety/restless 2 0 1 0 0  Suicidal thoughts 1 0 0 0 0  PHQ-9 Score 21 5 7 4 8   Difficult doing work/chores - Not difficult at all Somewhat difficult Somewhat difficult Somewhat difficult    Allergies  Allergen Reactions  . Losartan Potassium-Hctz Diarrhea  . Nsaids     On eliquis.   Marland Kitchen Zoloft [Sertraline Hcl] Diarrhea  . Benzodiazepines Other (See Comments)    Agitation.   Social History   Tobacco Use  . Smoking status: Former Smoker    Packs/day: 1.00    Years: 19.00    Pack years: 19.00    Quit date: 02/25/1982    Years since quitting: 38.8  . Smokeless tobacco: Former Systems developer    Quit date: 1990  Substance Use Topics  . Alcohol use: Yes    Comment: occ   Past Medical History:  Diagnosis Date  . Acute delirium 07/14/2020  . Adenomatous colon polyp 2016  . Allergy   . AMS (altered mental status) 07/13/2020  . Anxiety   . Bell palsy    resolved  . Bell's palsy 09/14/2020  . Chicken pox   . Concussion with no loss of consciousness 07/07/2018  . Depression   . Diet-controlled diabetes mellitus ([place])   . Eating disorder   . Essential hypertension 11/29/2017  . Gastro-esophageal reflux disease without esophagitis 09/14/2020  . GERD (gastroesophageal reflux disease)   . History of vitamin D deficiency   . Hyperlipidemia   . Hypertension   . Hypokalemia   . Memory loss   . MI (myocardial infarction) ([place])    per pt   . Neoplasm of uncertain behavior of skin 09/14/2020  . Personal history of colonic polyps  09/14/2020  . Ulcerative colitis ([place]) 2005-2006   severe    Past Surgical History:  Procedure Laterality Date  . COLONOSCOPY WITH PROPOFOL N/A 07/15/2014   Procedure: COLONOSCOPY WITH PROPOFOL;  Surgeon: [name], MD;  Location: [facility];  Service: Endoscopy;  Laterality: N/A;  . COLONOSCOPY WITH PROPOFOL N/A 08/22/2015   Procedure: COLONOSCOPY WITH PROPOFOL;  Surgeon: [name], MD;  Location: [facility];  Service: Endoscopy;  Laterality: N/A;  . ORIF ANKLE FRACTURE  02/27/2012   Procedure: OPEN REDUCTION INTERNAL FIXATION (ORIF) ANKLE FRACTURE;  Surgeon: Colin Rhein, MD;  Location: [employer] [facility];  Service: Orthopedics;  Laterality: Left;  ORIF left bimalleolar ankle fracture  . SHOULDER SURGERY     LEFT  . SIGMOIDOSCOPY  10/08/2018   Externa and Internal Hmorrhoids. Tubular and Tublovillous adenoma removed from transverse colon. Inflammatory pseudopolyps, negative for dysplasia  . TONSILLECTOMY    . UPPER GASTROINTESTINAL ENDOSCOPY     Family History  Problem Relation Age of Onset  . Dementia Mother   . Hypertension Mother   . Early death Father 22       drowning   . Post-traumatic stress disorder Brother   . Neuropathy Brother    Allergies as of 12/07/2020      Reactions   Losartan Potassium-hctz Diarrhea   Nsaids    On eliquis.    Zoloft [sertraline Hcl] Diarrhea   Benzodiazepines Other (See Comments)   Agitation.      Medication List       Accurate as of December 07, 2020 11:59 PM. If you have any questions, ask your nurse or doctor.        acetaminophen 325 MG tablet Commonly known as: TYLENOL Take 650 mg by mouth every 6 (six) hours as needed.   apixaban 5 MG Tabs tablet Commonly known as: ELIQUIS Take 1 tablet (5 mg total) by mouth 2 (two) times daily.   atorvastatin 20 MG tablet Commonly known as: LIPITOR Take 1 tablet (20 mg total) by mouth daily.   azaTHIOprine 50 MG tablet Commonly known as: IMURAN Take 200 mg by  mouth every morning.   cefdinir 300 MG capsule Commonly known as: OMNICEF Take 1 capsule (300 mg total) by mouth 2 (two) times daily. Started by: Howard Pouch, DO   diphenhydrAMINE 25 mg capsule Commonly known as: BENADRYL Take 100 mg by mouth at bedtime. For sleep   divalproex 125 MG capsule Commonly known as: DEPAKOTE SPRINKLE Take 6 capsules (750 mg total) by mouth every 12 (twelve) hours.   DULoxetine 60 MG capsule Commonly known as: CYMBALTA Take 1 capsule (60 mg total) by mouth daily.   levothyroxine 25 MCG tablet Commonly known as: Synthroid Take 1 tablet (25 mcg total) by mouth daily before breakfast.   Melatonin 10 MG Tabs Take by mouth.   Namenda 5 MG tablet Generic drug: memantine Take 5 mg by mouth daily.   OLANZapine 10 MG tablet Commonly known as: ZYPREXA 1/2 tab in the morning and 1.5 tabs QHS   potassium chloride 10 MEQ tablet Commonly known as: Klor-Con M10 Take 1 tablet (10 mEq total) by mouth daily.   sitaGLIPtin 25 MG tablet Commonly known as: Januvia Take 0.5 tablets (12.5 mg total) by mouth daily.   tamsulosin 0.4 MG Caps capsule Commonly known as: FLOMAX Take 1 capsule (0.4 mg total) by mouth daily.   vitamin D3 50 MCG (2000 UT) Caps Take by mouth.       All past medical history, surgical history, allergies, family history, immunizations and medications were updated in the EMR today and reviewed under the history and medication portions of their EMR.      ROS: Negative, with the exception of above mentioned in HPI   Objective:  BP (!) 78/42   Pulse 66   Temp 98.3 F (36.8 C) (Oral)   Ht 5' 7"  (1.702 m)  Wt 248 lb (112.5 kg)   SpO2 98%   BMI 38.84 kg/m  Body mass index is 38.84 kg/m. Gen: Afebrile. No acute distress.  Nontoxic in appearance.  Pleasant male with obvious cognitive decline HENT: AT. [place].  No cough or hoarseness Eyes:Pupils Equal Round Reactive to light, Extraocular movements intact,  Conjunctiva without  redness, discharge or icterus. CV: RRR, no edema, +2/4 P posterior tibialis pulses Chest: CTAB, no wheeze or crackles Skin: No rashes, purpura or petechiae.  Neuro:  Normal gait. PERLA. EOMi. Alert. Oriented to person place. Psych: Normal affect, dress and demeanor. Normal speech. Normal thought content and judgment.   Assessment/Plan: [name] Flesher Sr. is a 70 y.o. male present for OV for Hospital discharge follow up Major depression, recurrent, chronic (HCC)/anxiety//insomnia They have established with [facility] have had their first intake appointment and have another appointment coming up with his psychiatrist.  Encouraged melatonin use and maintaining a regular sleep, eat, wake schedule etc. Zyprexa 5 mg in the morning and 15 mg before bed, Continue Depakote 750 mg twice daily> continue Cymbalta 60 mg daily>  new psychiatry team which take over management and prescribing Depakote levels are in the therapeutic range - psych referral in place since 06/2020- denied by [facility].  QT prolongation-precautions on medication choices   dementia without behavioral disturbances - has yet to establish w/ neuro appointment is not until May.  -Neurology referral placed for his dementia.  This initially was marked urgent referral.  We will contact office and see if they are able to work him in sooner since his dementia is worsening. Care coordination referral placed today Social service information and referral placed today  BPH: Stable, continue on Flomax 0.4 mg daily.  Urinary incontinence/hypotension/lower extremity edema: He does have new urinary incontinence and hypotension.  His symptoms could be related and he could have urosepsis.  Outside his his dementia stay, he appears well today and nontoxic.  Wife also feels that he is having worsening dementia and hallucinations.  Again this could be secondary to urosepsis or side effect profile of Namenda includes urinary continence and  hallucinations. Uncertain cause of his lower extremity edema other than he is not being active and sitting in a chair throughout the day.  She states he does sit with his feet propped up.  He has a history of diastolic dysfunction, he very well may need Lasix but with his blood pressure so low it would not be safe to do so in the outpatient setting.  They were encouraged to report to the emergency room if symptoms are worsening in any way. Urinalysis with reflex culture collected today. Elected to start patient on Omnicef twice daily while we await urinalysis results given his hypotension. CBC, CMP, TSH collected today.  Chronic anticoagulation/A. Fib/hyperlipidemia/statin therapy/hypotension, lower extremity edema Encourage them to call his cardiology team.  I did place referral back to them today to discuss his lower extremity edema.  His blood pressure is extremely low today and he does have lower extremity edema, do not feel it is safe to start Lasix on him in the outpatient setting with a blood pressure as low as his is today.  They were encouraged if his symptoms are worsening before can get into cardiology, they should be seen in the emergency room right away for management. Heart rate better since stopping caffeine. Continue Eliquis 5 mg twice daily Continue Lipitor 20 mg daily CBC, CMP, TSH, BMP collected today. Referral to cardiology placed today  Hypothyroidism, unspecified type Continue levothyroxine 25 mcg daily.  TSH in range 10/12/2020 TSH collected again today to be thorough with onset of symptoms.  Diabetes mellitus without complication (HCC)/obesity A1c collected today.  Suspect we will be able to discontinue the Januvia since he has been able to taper down to 12.5 mg.  Left foot injury: Soak in [place]pson salt soak.  Do not believe x-rays needed at this time he is nontender and able to move his foot and weight-bear without difficulty.     Reviewed expectations re: course of  current medical issues.  Discussed self-management of symptoms.  Outlined signs and symptoms indicating need for more acute intervention.  Patient verbalized understanding and all questions were answered.  Patient received an After-Visit Summary.  Any changes in medications were reviewed and patient was provided with updated med list with their AVS.      Orders Placed This Encounter  Procedures  . Urine Culture  . Hemoglobin A1c  . Comp Met (CMET)  . CBC  . Urinalysis w microscopic + reflex cultur  . B Nat Peptide  . TSH  . REFLEXIVE URINE CULTURE  . Ambulatory referral to Social Work  . Ambulatory referral to Cardiology  . AMB Referral to [facility] ordered this encounter  Medications  . cefdinir (OMNICEF) 300 MG capsule    Sig: Take 1 capsule (300 mg total) by mouth 2 (two) times daily.    Dispense:  20 capsule    Refill:  0   > 50 Minutes was dedicated to this patient's encounter to include pre-visit review of chart, face-to-face time with patient and post-visit work- which include documentation and prescribing medications and/or ordering test when necessary.    Note is dictated utilizing voice recognition software. Although note has been proof read prior to signing, occasional typographical errors still can be missed. If any questions arise, please do not hesitate to call for verification.   electronically signed by:  Howard Pouch, DO  [place]

## 2020-12-08 ENCOUNTER — Telehealth: Payer: Self-pay | Admitting: Family Medicine

## 2020-12-08 ENCOUNTER — Encounter: Payer: Self-pay | Admitting: Family Medicine

## 2020-12-08 MED ORDER — LEVOTHYROXINE SODIUM 50 MCG PO TABS
50.0000 ug | ORAL_TABLET | Freq: Every day | ORAL | 3 refills | Status: DC
Start: 2020-12-08 — End: 2021-03-01

## 2020-12-08 NOTE — Telephone Encounter (Signed)
Spoke with pt wife regarding labs and instructions. Pt wife states that both legs are now swollen.

## 2020-12-08 NOTE — Telephone Encounter (Signed)
Please inform patient's wife the following information: He definitely has what appears to be a urinary tract infection.  Please make sure they started the cefdinir/Omnicef antibiotic twice daily.  The urine culture/sensitivities will not return until possibly tomorrow or Monday for [place] to know for certain that antibiotic is effective, but it is typically one of the most common one that is effective.  Is kidney and liver function, as well as electrolytes are normal. His blood cell counts are normal. His A1c is 5.3.  I believe they can discontinue the Januvia altogether.  I would have her hold on to the medication, in the event we need to restart at a later date if is A1c raises again. His thyroid is under replaced.  I have called in an increased dose of his levothyroxine for them to start.  They can finish the bottle of levothyroxine they currently have by taking two of the 25 mcg levothyroxine pills to equal levothyroxine 50 mcg daily.  The new bottle will be 50 mcg per tab, once they pick that up and start using go back to 1 tab daily.  I did place a referral to social services for them to help with resources for dementia patients.  If they have not heard from them within a week please have her call us back so that we can look into the referral.   I also placed a referral back to his cardiologist for reevaluation of his lower extremity edema.  I do not want to give him diuretics with his blood pressure being as low as it was yesterday.  Therefore I had like cardiology opinion to weigh in on the cause of his lower extremity edema and treatment.  In the meantime, please schedule him for follow-up in 2 weeks with this provider.  So we can recheck his status/blood pressure, urine and foot.

## 2020-12-09 ENCOUNTER — Encounter: Payer: Self-pay | Admitting: Family Medicine

## 2020-12-09 DIAGNOSIS — I959 Hypotension, unspecified: Secondary | ICD-10-CM | POA: Insufficient documentation

## 2020-12-09 DIAGNOSIS — R600 Localized edema: Secondary | ICD-10-CM | POA: Insufficient documentation

## 2020-12-09 DIAGNOSIS — F339 Major depressive disorder, recurrent, unspecified: Secondary | ICD-10-CM | POA: Diagnosis not present

## 2020-12-09 DIAGNOSIS — N3944 Nocturnal enuresis: Secondary | ICD-10-CM | POA: Insufficient documentation

## 2020-12-09 NOTE — Telephone Encounter (Signed)
Spoke with pt wife providers instructions

## 2020-12-09 NOTE — Telephone Encounter (Signed)
Please see staff message concerning patient and provide them with that information. In addition, please encourage her to take him to the ED if his symptoms worsen, he becomes short of breath, fever or legs become more swollen.  He may need diuretics, but we can not provide that safely without continuous monitor on his BP since it was so low.  Even If she is not seeing improvement, she should take him to ED. I am concerned for sepsis with his BP that low and the look of his urine.

## 2020-12-09 NOTE — Telephone Encounter (Signed)
For neurology: She can call and ask them to be moved up due to his worsening.  We can also have front office staff try to call to see if he can be moved up also.    Appoint with Dr. Ellouise Newer in May, can we see if her office staff can communicate with their office to move this up if at all possible due to patient's worsening dementia condition.  Thanks.

## 2020-12-09 NOTE — Telephone Encounter (Signed)
Pt wife questioned what to do in regards to neurology

## 2020-12-09 NOTE — Telephone Encounter (Signed)
Spoke with pt wife about social services. Pt wife was checking in to an appt I will CB later

## 2020-12-09 NOTE — Telephone Encounter (Signed)
LVM for pt wife, [name], to [facility] in regards neuro appt and to also call their office as prev discussed.   Note: Please assist with seeing if they have any appts avail sooner.  Thanks

## 2020-12-10 LAB — URINALYSIS W MICROSCOPIC + REFLEX CULTURE
Bilirubin Urine: NEGATIVE
Glucose, UA: NEGATIVE
Ketones, ur: NEGATIVE
Nitrites, Initial: POSITIVE — AB
Specific Gravity, Urine: 1.012 (ref 1.001–1.03)
Squamous Epithelial / HPF: NONE SEEN /HPF (ref <=5)
pH: 6 (ref 5.0–8.0)

## 2020-12-10 LAB — URINE CULTURE
MICRO NUMBER:: 11715695
SPECIMEN QUALITY:: ADEQUATE

## 2020-12-10 LAB — CULTURE INDICATED

## 2020-12-12 ENCOUNTER — Other Ambulatory Visit: Payer: Self-pay

## 2020-12-12 NOTE — Patient Outreach (Signed)
[facility]) Care Management  12/12/2020  [name] Sr. 1951/03/12 [id number]   Telephone Screen  Referral Date: 12/09/2020 Referral Source: MD Office Referral Reason: "disease mgmt services" Insurance: [employer]  Initial Assessment  Outreach attempt # 1 to patient. Spoke with spouse due to patient's documented mental status/dementia.    Social: Patient resides in his home along with spouse. Spouse also provides care to her 74 yr old who also suffers from dementia. She reports she gets some in home assistance for her mother. Patient requires assistance with ADLs and dependent with IADLs. Spouse reports she is able to assist patient and not interested in home personal care services after she found out it would be an out of pocket expense. She would like to discuss placement options and other alternatives. She does not think patient would do good with adult day cares/PACE programs.    Conditions: Per chart review, patient has PMH that includes but not limited to dementia,DM, OSA, GAD, obesity,HLD and hypothyroidism. Spouse reports that patient's dementia continues to worsen. She is having difficulty caring for two loved ones suffering from dementia.   Medications: Med review completed with spouse. Patient denies any issues managing and/or affording meds at this time.  Medications Reviewed Today    Reviewed by Hayden Pedro, RN (Registered Nurse) on 12/12/20 at 1034  Med List Status: <None>  Medication Order Taking? Sig Documenting Provider Last Dose Status Informant  acetaminophen (TYLENOL) 325 MG tablet [id number] No Take 650 mg by mouth every 6 (six) hours as needed. [provider] Taking Active   apixaban (ELIQUIS) 5 MG TABS tablet [id number] No Take 1 tablet (5 mg total) by mouth 2 (two) times daily. [name], [name], DO Taking Active Spouse/Significant Other  atorvastatin (LIPITOR) 20 MG tablet [id number] No Take 1 tablet (20 mg total)  by mouth daily. [name], [name], DO Taking Active Spouse/Significant Other  azaTHIOprine (IMURAN) 50 MG tablet [id number] No Take 200 mg by mouth every morning. [provider] Taking Active Spouse/Significant Other  cefdinir (OMNICEF) 300 MG capsule [id number]  Take 1 capsule (300 mg total) by mouth 2 (two) times daily. [name], [name], DO  Active   Cholecalciferol (VITAMIN D3) 50 MCG (2000 UT) CAPS [id number] No Take by mouth. [provider] Taking Active   diphenhydrAMINE (BENADRYL) 25 mg capsule [id number] No Take 100 mg by mouth at bedtime. For sleep [provider] Taking Active   divalproex (DEPAKOTE SPRINKLE) 125 MG capsule [id number] No Take 6 capsules (750 mg total) by mouth every 12 (twelve) hours. [name], [name], DO Taking Active   DULoxetine (CYMBALTA) 60 MG capsule [id number] No Take 1 capsule (60 mg total) by mouth daily. [name], [name], DO Taking Active   levothyroxine (SYNTHROID) 50 MCG tablet [id number]  Take 1 tablet (50 mcg total) by mouth daily before breakfast. [name], [name], DO  Active   Melatonin 10 MG TABS [id number] No Take by mouth. [provider] Taking Active   memantine (NAMENDA) 5 MG tablet [id number] No Take 5 mg by mouth daily. [provider] Taking Active   OLANZapine (ZYPREXA) 10 MG tablet [id number] No 1/2 tab in the morning and 1.5 tabs QHS [name], [name], DO Taking Active   potassium chloride (KLOR-CON M10) 10 MEQ tablet [id number] No Take 1 tablet (10 mEq total) by mouth daily. [name], [name], DO Taking Active Spouse/Significant Other  tamsulosin (FLOMAX) 0.4 MG CAPS capsule [id number] No Take 1 capsule (0.4 mg total) by  mouth daily. [name], DO Taking Active           Fall Risk  12/12/2020 09/14/2020 05/26/2019 04/24/2018 04/07/2018  Falls in the past year? 1 0 1 Yes Yes  Number falls in past yr: 0 0 0 2 or more 2 or more  Injury with Fall? 0 0 0 No No  Comment - - - - -  Risk Factor Category  - - - High Fall  Risk High Fall Risk  Risk for fall due to : History of fall(s);Impaired balance/gait;Impaired mobility;Medication side effect;Mental status change - History of fall(s);Impaired vision;Medication side effect - -  Follow up Education provided;Falls prevention discussed;Falls evaluation completed - Education provided;Falls evaluation completed;Falls prevention discussed Falls prevention discussed Falls prevention discussed   Depression screen [facility] 2/9 06/30/2020 12/09/2019 05/26/2019 11/17/2018 05/14/2018  Decreased Interest 3 0 0 0 1  Down, Depressed, Hopeless 3 1 1 1 3   PHQ - 2 Score 6 1 1 1 4   Altered sleeping 3 0 1 1 2   Tired, decreased energy 2 2 1 1  0  Change in appetite 3 1 1  0 0  Feeling bad or failure about yourself  3 0 1 1 1   Trouble concentrating 1 1 1  0 1  Moving slowly or fidgety/restless 2 0 1 0 0  Suicidal thoughts 1 0 0 0 0  PHQ-9 Score 21 5 7 4 8   Difficult doing work/chores - Not difficult at all Somewhat difficult Somewhat difficult Somewhat difficult   SDOH Screenings   Alcohol Screen: Not on file  Depression (PHQ2-9): Medium Risk  . PHQ-2 Score: 21  Financial Resource Strain: Not on file  Food Insecurity: Unknown  . Worried About Charity fundraiser in the Last Year: Never true  . Ran Out of Food in the Last Year: Not on file  Housing: Not on file  Physical Activity: Not on file  Social Connections: Not on file  Stress: Not on file  Tobacco Use: Medium Risk  . Smoking Tobacco Use: Former Smoker  . Smokeless Tobacco Use: Former Soil scientist Needs: No Transportation Needs  . Lack of Transportation (Medical): No  . Lack of Transportation (Non-Medical): No    Appointments: Patient saw PCP on 12/07/2020. He is also followed by psych MD and saw recently. Patient sees neuro MD(Aquino)-next appt 01/17/2021   Consent:THN services reviewed and discussed with spouse. Verbal consent for services given.   Goals Addressed              This Visit's Progress   .   (THN)Planning for Long-Term Care-Dementia (pt-stated)        Timeframe:  Long-Range Goal Priority:  High Start Date:   12/12/2020                          Expected End Date:   04/08/2021                    Follow Up Date May 2022   - check out other places when staying at home is no longer possible (assisted living center, nursing home) - check out services like in-home help or adult day care - hold a family meeting with family members and loved ones - list the symptoms that would make staying at home too hard - look at health insurance policy and other saving accounts so you know how much money you have - make a list of people who can help  and what they can do    Why is this important?    Learning that you or your loved one has dementia can be scary and stressful.   You can reduce stress by planning.   Preparing for the future is one of the most important things to do.   Thinking about how much care you/your loved one will need and how much it will cost is not easy.   Early on, you/your loved one can be part of making decisions for the future.   Making sure that your/your loved one's wishes for care are known is important.     Notes:  12/12/20  Spouse states she is not interested in in home aide services. She is able to assist patient with ADLs at this time. She would like to speak with SW to discuss options and possible placement. Spouse really wanting someone to come to the home. RN CM referred patient to [facility] for placement discussion/assistance as well as [facility].     .  (THN)Prevent Falls-Dementia (pt-stated)        Timeframe:  Long-Range Goal Priority:  High Start Date:   12/12/2020                          Expected End Date: 04/08/2021                      Follow Up Date May 2022   - make an emergency alert plan in case I fall - use a cane or walker - use nightlight in the bathroom, halls    Why is this important?    There may be trouble  with balance and getting around. Falls can happen.    Notes:  12/12/20-Spouse reports last fall was about a month ago. She has installed some safety measures to prevent falls. Patient is ambulatory-has DME in the home. Spouse reports patient was "wandering" at nights but that has since stopped since she upgraded lock door system in home.     .  Care for the Caregiver-Dementia        Plan: RN CM discussed with caregiver next outreach within the month of May. Caregiver gave verbal consent and in agreement with RN CM follow up and timeframe. Caregiver aware that they may contact RN CM sooner for any issues or concerns. RN CM reviewed goals and plan of care with caregiver. Caregiver agrees to care plan and follow up. RN CM will send barriers letter and route encounter to PCP. RN CM will send welcome letter to patient/spouse. RN CM completed [facility] SW referral for placement options.   Enzo Montgomery, RN,BSN,CCM [place] Management Telephonic Care Management Coordinator Direct Phone: [phone] Toll Free: [phone] Fax: [phone]

## 2020-12-12 NOTE — Telephone Encounter (Signed)
Spoke with pt wife and informed her that we will help assist. Pt wife has called and is waitlisted

## 2020-12-12 NOTE — Telephone Encounter (Signed)
Wife, [name] [facility]) returning call please call her back at [phone].

## 2020-12-13 ENCOUNTER — Telehealth: Payer: Self-pay

## 2020-12-13 ENCOUNTER — Encounter: Payer: Self-pay | Admitting: *Deleted

## 2020-12-13 ENCOUNTER — Other Ambulatory Visit: Payer: Self-pay

## 2020-12-13 ENCOUNTER — Other Ambulatory Visit: Payer: Self-pay | Admitting: *Deleted

## 2020-12-13 ENCOUNTER — Emergency Department (HOSPITAL_BASED_OUTPATIENT_CLINIC_OR_DEPARTMENT_OTHER)
Admission: EM | Admit: 2020-12-13 | Discharge: 2020-12-13 | Disposition: A | Discharge status: Home / Self Care | Payer: Medicare HMO | Attending: Emergency Medicine | Admitting: Emergency Medicine

## 2020-12-13 ENCOUNTER — Encounter (HOSPITAL_BASED_OUTPATIENT_CLINIC_OR_DEPARTMENT_OTHER): Payer: Self-pay

## 2020-12-13 ENCOUNTER — Emergency Department (HOSPITAL_BASED_OUTPATIENT_CLINIC_OR_DEPARTMENT_OTHER): Payer: Medicare HMO

## 2020-12-13 DIAGNOSIS — E039 Hypothyroidism, unspecified: Secondary | ICD-10-CM | POA: Diagnosis not present

## 2020-12-13 DIAGNOSIS — Z87891 Personal history of nicotine dependence: Secondary | ICD-10-CM | POA: Insufficient documentation

## 2020-12-13 DIAGNOSIS — I10 Essential (primary) hypertension: Secondary | ICD-10-CM | POA: Insufficient documentation

## 2020-12-13 DIAGNOSIS — Z79899 Other long term (current) drug therapy: Secondary | ICD-10-CM | POA: Insufficient documentation

## 2020-12-13 DIAGNOSIS — Y92002 Bathroom of unspecified non-institutional (private) residence single-family (private) house as the place of occurrence of the external cause: Secondary | ICD-10-CM | POA: Diagnosis not present

## 2020-12-13 DIAGNOSIS — E119 Type 2 diabetes mellitus without complications: Secondary | ICD-10-CM | POA: Diagnosis not present

## 2020-12-13 DIAGNOSIS — Z7901 Long term (current) use of anticoagulants: Secondary | ICD-10-CM | POA: Diagnosis not present

## 2020-12-13 DIAGNOSIS — S0990XA Unspecified injury of head, initial encounter: Secondary | ICD-10-CM

## 2020-12-13 DIAGNOSIS — S0081XA Abrasion of other part of head, initial encounter: Secondary | ICD-10-CM | POA: Insufficient documentation

## 2020-12-13 DIAGNOSIS — W01198A Fall on same level from slipping, tripping and stumbling with subsequent striking against other object, initial encounter: Secondary | ICD-10-CM | POA: Insufficient documentation

## 2020-12-13 DIAGNOSIS — F0390 Unspecified dementia without behavioral disturbance: Secondary | ICD-10-CM | POA: Diagnosis not present

## 2020-12-13 NOTE — ED Provider Notes (Signed)
[facility] EMERGENCY DEPT Provider Note   CSN: [id number] Arrival date & time: 12/13/20  1705     History Chief Complaint  Patient presents with  . Fall    [name] WELTY Sr. is a 70 y.o. male. Level 5 caveat due to dementia. HPI Patient presents after fall.  Last night fell in the bathroom and accidentally put his head through the wall.  Patient does not want to come in the hospital.  History of dementia.  Patient's wife states that he is at his baseline at this time however.  He is on Eliquis for atrial fibrillation and was instructed to come to the hospital.  No neck pain.  No syncope.  Reportedly has some severe memory issues and they are working on further placement.    Past Medical History:  Diagnosis Date  . Acute delirium 07/14/2020  . Adenomatous colon polyp 2016  . Allergy   . AMS (altered mental status) 07/13/2020  . Anxiety   . Bell palsy    resolved  . Bell's palsy 09/14/2020  . Chicken pox   . Concussion with no loss of consciousness 07/07/2018  . Depression   . Diet-controlled diabetes mellitus ([place])   . Eating disorder   . Essential hypertension 11/29/2017  . Gastro-esophageal reflux disease without esophagitis 09/14/2020  . GERD (gastroesophageal reflux disease)   . History of vitamin D deficiency   . Hyperlipidemia   . Hypertension   . Hypokalemia   . Memory loss   . MI (myocardial infarction) ([place])    per pt   . Neoplasm of uncertain behavior of skin 09/14/2020  . Personal history of colonic polyps 09/14/2020  . Ulcerative colitis ([place]) 2005-2006   severe     Patient Active Problem List   Diagnosis Date Noted  . Lower extremity edema 12/09/2020  . Nocturnal enuresis 12/09/2020  . Hypotension 12/09/2020  . Type 2 diabetes mellitus without complication ([place]), diet controlled 09/15/2020  . Benign prostatic hyperplasia 09/15/2020  . 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 Hypercholesterolemia 09/14/2020  . Hypothyroidism   .  Vitamin B 12 deficiency   . On statin therapy 05/26/2019  . Chronic anticoagulation 05/26/2019  . QT prolongation 02/19/2019  . Aortic atherosclerosis ([place]) 02/18/2019  . Carotid artery calcification 02/18/2019  . Cardiomegaly 02/18/2019  . Dementia ([place]) 02/17/2019  . Atrial fibrillation with rapid ventricular response ([place]) 02/17/2019  . Hypokalemia 02/17/2019  . GAD (generalized anxiety disorder) 05/14/2018  . Insomnia 11/29/2017  . Moderate obstructive sleep apnea 11/08/2017  . Class 3 severe obesity due to excess calories with body mass index (BMI) of 40.0 to 44.9 in adult ([place]) 11/08/2017  . Major depression, recurrent, chronic ([place]) 09/18/2017    Past Surgical History:  Procedure Laterality Date  . COLONOSCOPY WITH PROPOFOL N/A 07/15/2014   Procedure: COLONOSCOPY WITH PROPOFOL;  Surgeon: [name], MD;  Location: [facility] ENDOSCOPY;  Service: Endoscopy;  Laterality: N/A;  . COLONOSCOPY WITH PROPOFOL N/A 08/22/2015   Procedure: COLONOSCOPY WITH PROPOFOL;  Surgeon: [name], MD;  Location: [facility] ENDOSCOPY;  Service: Endoscopy;  Laterality: N/A;  . ORIF ANKLE FRACTURE  02/27/2012   Procedure: OPEN REDUCTION INTERNAL FIXATION (ORIF) ANKLE FRACTURE;  Surgeon: Colin Rhein, MD;  Location: [facility];  Service: Orthopedics;  Laterality: Left;  ORIF left bimalleolar ankle fracture  . SHOULDER SURGERY     LEFT  . SIGMOIDOSCOPY  10/08/2018   Externa and Internal Hmorrhoids. Tubular and Tublovillous adenoma removed from transverse  colon. Inflammatory pseudopolyps, negative for dysplasia  . TONSILLECTOMY    . UPPER GASTROINTESTINAL ENDOSCOPY         Family History  Problem Relation Age of Onset  . Dementia Mother   . Hypertension Mother   . Early death Father 44       drowning   . Post-traumatic stress disorder Brother   . Neuropathy Brother     Social History   Tobacco Use  . Smoking status: Former Smoker    Packs/day: 1.00    Years: 19.00    Pack  years: 19.00    Quit date: 02/25/1982    Years since quitting: 38.8  . Smokeless tobacco: Former Systems developer    Quit date: Biochemist, clinical  . Vaping Use: Never used  Substance Use Topics  . Alcohol use: Yes    Comment: occ  . Drug use: No    Home Medications Prior to Admission medications   Medication Sig Start Date End Date Taking? Authorizing Provider  levothyroxine (SYNTHROID) 50 MCG tablet Take 1 tablet (50 mcg total) by mouth daily before breakfast. 12/08/20 03/08/21 Yes [name], [name], DO  memantine (NAMENDA) 5 MG tablet Take 5 mg by mouth daily.   Yes [provider]  acetaminophen (TYLENOL) 325 MG tablet Take 650 mg by mouth every 6 (six) hours as needed.    [provider]  apixaban (ELIQUIS) 5 MG TABS tablet Take 1 tablet (5 mg total) by mouth 2 (two) times daily. 06/30/20 06/25/21  [name], [name], DO  atorvastatin (LIPITOR) 20 MG tablet Take 1 tablet (20 mg total) by mouth daily. 06/30/20   [name], [name], DO  azaTHIOprine (IMURAN) 50 MG tablet Take 200 mg by mouth every morning.    [provider]  cefdinir (OMNICEF) 300 MG capsule Take 1 capsule (300 mg total) by mouth 2 (two) times daily. 12/07/20   [name], [name], DO  Cholecalciferol (VITAMIN D3) 50 MCG (2000 UT) CAPS Take by mouth.    [provider]  diphenhydrAMINE (BENADRYL) 25 mg capsule Take 100 mg by mouth at bedtime. For sleep    [provider]  divalproex (DEPAKOTE SPRINKLE) 125 MG capsule Take 6 capsules (750 mg total) by mouth every 12 (twelve) hours. 09/14/20   [name], [name], DO  DULoxetine (CYMBALTA) 60 MG capsule Take 1 capsule (60 mg total) by mouth daily. 09/14/20   [name], [name], DO  Melatonin 10 MG TABS Take by mouth.    [provider]  OLANZapine (ZYPREXA) 10 MG tablet 1/2 tab in the morning and 1.5 tabs QHS 10/14/20   [name], [name], DO  potassium chloride (KLOR-CON M10) 10 MEQ tablet Take 1 tablet (10 mEq total) by mouth daily. 07/01/20   [name], Renee  A, DO  tamsulosin (FLOMAX) 0.4 MG CAPS capsule Take 1 capsule (0.4 mg total) by mouth daily. 09/14/20   [name], [name], DO    Allergies    Losartan potassium-hctz, Nsaids, Zoloft [sertraline hcl], and Benzodiazepines  Review of Systems   Review of Systems  Unable to perform ROS: Dementia    Physical Exam Updated Vital Signs BP 111/70 (BP Location: Right Arm)   Pulse 90   Temp 97.9 F (36.6 C) (Oral)   Resp 20   Ht 5' 8"  (1.727 m)   Wt 112.5 kg   SpO2 97%   BMI 37.71 kg/m   Physical Exam Vitals and nursing note reviewed.  HENT:     Head:  Comments: Abrasion to forehead.  No deformity.  Mild chronic droop of left eyebrow.  Eyes:     Pupils: Pupils are equal, round, and reactive to light.  Cardiovascular:     Rate and Rhythm: Rhythm irregular.  Chest:     Chest wall: No tenderness.  Abdominal:     Tenderness: There is no abdominal tenderness.  Musculoskeletal:        General: No tenderness.     Cervical back: Neck supple.  Skin:    General: Skin is warm.     Capillary Refill: Capillary refill takes less than 2 seconds.  Neurological:     Mental Status: He is alert. Mental status is at baseline.     Comments: Awake and pleasant but confused, able to recognize his wife.     ED Results / Procedures / Treatments   Labs (all labs ordered are listed, but only abnormal results are displayed) Labs Reviewed - No data to display  EKG EKG Interpretation  Date/Time:  Tuesday December 13 2020 17:16:14 EDT Ventricular Rate:  92 PR Interval:    QRS Duration: 129 QT Interval:  398 QTC Calculation: 493 R Axis:   71 Text Interpretation: Atrial fibrillation Nonspecific intraventricular conduction delay Baseline wander in lead(s) V1 V2 Confirmed by Davonna Belling [phone]) on 12/13/2020 5:18:21 PM   Radiology CT Head Wo Contrast  Result Date: 12/13/2020 CLINICAL DATA:  [place] [address] in the bathroom and hit head. EXAM: CT HEAD WITHOUT CONTRAST TECHNIQUE: Contiguous axial images  were obtained from the base of the skull through the vertex without intravenous contrast. COMPARISON:  07/13/2020 FINDINGS: Brain: Stable mild age advanced cerebral atrophy, ventriculomegaly and periventricular white matter disease. No extra-axial fluid collections are identified. No CT findings for acute hemispheric infarction or intracranial hemorrhage. No mass lesions. The brainstem and cerebellum are normal. Vascular: Stable vascular calcifications. No aneurysm or hyperdense vessels. Skull: No skull fracture or bone lesions. Sinuses/Orbits: The paranasal sinuses and mastoid air cells are clear. The globes are intact. Other: No scalp lesions or scalp hematoma or obvious laceration. IMPRESSION: 1. Stable mild age advanced cerebral atrophy, ventriculomegaly and periventricular white matter disease. 2. No acute intracranial findings or skull fracture. Electronically Signed   By: Marijo Sanes M.D.   On: 12/13/2020 18:05    Procedures Procedures   Medications Ordered in ED Medications - No data to display  ED Course  I have reviewed the triage vital signs and the nursing notes.  Pertinent labs & imaging results that were available during my care of the patient were reviewed by me and considered in my medical decision making (see chart for details).    MDM Rules/Calculators/A&P                          Patient with dementia and mechanical fall.  [place] [address] and hit his head.  History comes from patient's wife.  On Eliquis for A. fib.  No loss of consciousness.  Head CT done reassuring.  No other work-up required at this time.  Limited just to evidence of trauma.  Discharge home with outpatient follow-up as needed Final Clinical Impression(s) / ED Diagnoses Final diagnoses:  Minor head injury, initial encounter    Rx / DC Orders ED Discharge Orders    None       Davonna Belling, MD 12/13/20 1812

## 2020-12-13 NOTE — Telephone Encounter (Signed)
Spoke with wife and informed of providers instructions. Pt son is going to try and convince pt, if not was advise to go call EMS

## 2020-12-13 NOTE — Telephone Encounter (Signed)
FYI, spoke with pt's wife, [name] and pt is refusing to go to ER for evaluation.   [name] [facility] - Client TELEPHONE ADVICE RECORD [facility] Patient Name: [name] Gender: Male DOB: [birth year] Age: 70 Y 26 M 5 D Return Phone Number: [phone] (Primary), [phone] (Secondary) JJHERDE:[address] [place] Zip: [place] [facility] - Client Client Site [place] - Day Physician [name], [place] Contact Type Call Who Is Calling Patient / Member / Family / Caregiver Call Type Triage / Clinical Caller Name Jeramie Scogin Relationship To Patient Spouse Return Phone Number [phone] (Primary) Chief Complaint HEAD INJURY - and not acting right. Change in behaviour after hitting head. Reason for Call Symptomatic / Request for [name] states husband fell last night, face first into wall, dementia, and wants to know if he needs to be seen. He has scrapes. He didn't remember doing it. [facility] on [address] No Nurse Assessment Nurse: Velta Addison, RN, [place] Date/Time (Eastern Time): 12/13/2020 10:32:04 AM Confirm and document reason for call. If symptomatic, describe symptoms. ---Caller states husband fell last night, face first into wall, dementia, and wants to know if he needs to be seen. He has scrapes. He didn't remember doing it. Caller states his head went through the wall. Went to get up from toilet, lost his balance and hit his head face first. Not a witnessed fall. No bleeding, just scratched his forehead. Complaining of headache. Laid in floor for 5-10 mins before trying to get up. No knot on his head. Does the patient have any new or worsening symptoms? ---Yes Will a triage be completed? ---Yes Related visit to physician within the last 2 weeks? ---Yes Does the PT have any chronic conditions? (i.e. diabetes, asthma,  this includes High risk factors for pregnancy, etc.) ---Yes List chronic conditions. ---dementia Is this a behavioral health or substance abuse call? ---No PLEASE NOTE: All timestamps contained within this report are represented as [place] Standard Time. CONFIDENTIALTY NOTICE: This fax transmission is intended only for the addressee. It contains information that is legally privileged, confidential or otherwise protected from use or disclosure. If you are not the intended recipient, you are strictly prohibited from reviewing, disclosing, copying using or disseminating any of this information or taking any action in reliance on or regarding this information. If you have received this fax in error, please notify us immediately by telephone so that we can arrange for its return to [place]. Phone: [phone], Toll-Free: [phone], Fax: [phone] Page: 2 of 2 Call Id: [id number] Guidelines Guideline Title Affirmed Question Affirmed Notes Nurse Date/Time Eilene Ghazi Time) Head Injury Taking Coumadin (warfarin) or other strong blood thinner, or known bleeding disorder (e.g., thrombocytopenia) Parrott, RN, [place] 12/13/2020 10:35:01 AM Disp. Time Eilene Ghazi Time) Disposition Final User 12/13/2020 10:29:47 AM Send to Urgent Ezra Sites 12/13/2020 10:38:20 AM Go to ED Now (or PCP triage) Yes Velta Addison, RN, [place] Disagree/Comply Comply Caller Understands Yes PreDisposition Call Doctor Care Advice Given Per Guideline GO TO ED NOW (OR PCP TRIAGE): * IF NO PCP (PRIMARY CARE PROVIDER) SECOND-LEVEL TRIAGE: You need to be seen within the next hour. Go to the [facility] at _____________ [place] as soon as you can. CARE ADVICE given per Head Injury (Adult) guideline. Comments User: Hamilton Capri, RN Date/Time Eilene Ghazi Time): 12/13/2020 10:39:57 AM Had recent concussion, but not within the past 2 weeks. Is taking Eliquis. Unsure  if he has no memory of fall due to dementia or fall. Taking  him to ER now to be assessed. Referrals GO TO FACILITY OTHER - SPECIFY

## 2020-12-13 NOTE — Patient Outreach (Signed)
[facility]) Care Management  [facility] Social Work  12/13/2020  [name] Sr. 1950/10/09 [id number]  Encounter Medications:  Outpatient Encounter Medications as of 12/13/2020  Medication Sig  . acetaminophen (TYLENOL) 325 MG tablet Take 650 mg by mouth every 6 (six) hours as needed.  Marland Kitchen apixaban (ELIQUIS) 5 MG TABS tablet Take 1 tablet (5 mg total) by mouth 2 (two) times daily.  Marland Kitchen atorvastatin (LIPITOR) 20 MG tablet Take 1 tablet (20 mg total) by mouth daily.  Marland Kitchen azaTHIOprine (IMURAN) 50 MG tablet Take 200 mg by mouth every morning.  . cefdinir (OMNICEF) 300 MG capsule Take 1 capsule (300 mg total) by mouth 2 (two) times daily.  . Cholecalciferol (VITAMIN D3) 50 MCG (2000 UT) CAPS Take by mouth.  . diphenhydrAMINE (BENADRYL) 25 mg capsule Take 100 mg by mouth at bedtime. For sleep  . divalproex (DEPAKOTE SPRINKLE) 125 MG capsule Take 6 capsules (750 mg total) by mouth every 12 (twelve) hours.  . DULoxetine (CYMBALTA) 60 MG capsule Take 1 capsule (60 mg total) by mouth daily.  Marland Kitchen levothyroxine (SYNTHROID) 50 MCG tablet Take 1 tablet (50 mcg total) by mouth daily before breakfast.  . Melatonin 10 MG TABS Take by mouth.  . memantine (NAMENDA) 5 MG tablet Take 5 mg by mouth daily.  Marland Kitchen OLANZapine (ZYPREXA) 10 MG tablet 1/2 tab in the morning and 1.5 tabs QHS  . potassium chloride (KLOR-CON M10) 10 MEQ tablet Take 1 tablet (10 mEq total) by mouth daily.  . tamsulosin (FLOMAX) 0.4 MG CAPS capsule Take 1 capsule (0.4 mg total) by mouth daily.   No facility-administered encounter medications on file as of 12/13/2020.    Functional Status:  In your present state of health, do you have any difficulty performing the following activities: 12/12/2020 07/13/2020  Hearing? N N  Vision? N N  Difficulty concentrating or making decisions? Y Y  Comment dementia -  Walking or climbing stairs? 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 Y N   Comment spouse assists -  Doing errands, shopping? Y N  Comment spouse assists due to dementia -  Conservation officer, nature and eating ? Y -  Comment spouse assists -  Using the Toilet? Y -  Comment incontinence-uses diapers at night -  In the past six months, have you accidently leaked urine? Y -  Comment wears diapers QHS -  Do you have problems with loss of bowel control? N -  Managing your Medications? Y -  Comment spouse manages -  Managing your Finances? Y -  Comment dementia-spouse manages -  Housekeeping or managing your Housekeeping? Y -  Comment spouse manages due to dementia -  Some recent data might be hidden    Fall/Depression Screening:  PHQ 2/9 Scores 12/13/2020 12/12/2020 06/30/2020 12/09/2019 05/26/2019 11/17/2018 05/14/2018  PHQ - 2 Score 1 - 6 1 1 1 4   PHQ- 9 Score - - 21 5 7 4 8   Exception Documentation Medical reason Other- indicate reason in comment box - - - - -  Not completed Verified by wife - [place].  Patient has Dementia and unable to answer questions appropriately and accurately. call completed with spouse - - - - -    Assessment:  Goals Addressed            This Visit's Progress   . Maintain Quality of Life through [facility] versus Long-Term Memory Care Assisted Living Placement.  On track    Timeframe:  Short-Term Goal Priority:  High Start Date:   12/13/2020                        Expected End Date:  02/13/2021                    Follow Up Date:   12/16/2020 at 2:30pm. Patient Goals: . Patient and wife will decide whether or not they would like for LCSW to assist them with arranging in-home care services, or receive assistance with arranging long-term memory care assisted living facility placement for patient. . Patient and wife will select at least 4 long-term memory care assisted living facilities, from the list provided to them, and share these facilities of interest will LCSW during the next scheduled telephone outreach call, if they decide  to pursue placement. . Patient's wife will consider applying for [place] Medicaid, through the [place]. Marland Kitchen LCSW will review contents of resource packet with patient's wife, mailed to their home today, to ensure understanding and assist with application completion/submission, if necessary, during the next scheduled telephone outreach call.        Follow-up:  12/16/2020 at 2:30pm.

## 2020-12-13 NOTE — Telephone Encounter (Signed)
Please call patient's wife: If he has sustained a head injury, hit his head in any fashion, he should go to the emergency room.  He is taking a blood thinner which makes him a higher risk to have bleeding within the brain when hitting his head.   If she is having difficulty getting him to the ED because he is refusing, she can call EMS.

## 2020-12-13 NOTE — ED Triage Notes (Signed)
Pt arrives to ED from home with complaints of falling in the bathroom since 0300 and hitting his head so hard he put a hole in the sheet rock. Patient reports that he is on eliquis for afib, his PCP told him to come for a head CT. Pt placed in position of comfort with bed locked and lowered, call bell in reach. Alert upon arrival, oriented to person, hx of dementia.

## 2020-12-15 ENCOUNTER — Other Ambulatory Visit: Payer: Self-pay | Admitting: Family Medicine

## 2020-12-16 ENCOUNTER — Other Ambulatory Visit: Payer: Self-pay | Admitting: *Deleted

## 2020-12-16 ENCOUNTER — Ambulatory Visit: Payer: Medicare HMO | Admitting: Neurology

## 2020-12-16 ENCOUNTER — Ambulatory Visit: Payer: Medicare HMO | Admitting: *Deleted

## 2020-12-16 ENCOUNTER — Other Ambulatory Visit: Payer: Self-pay

## 2020-12-16 ENCOUNTER — Encounter: Payer: Self-pay | Admitting: Neurology

## 2020-12-16 VITALS — BP 137/81 | HR 96 | Resp 18 | Ht 68.0 in | Wt 243.0 lb

## 2020-12-16 DIAGNOSIS — G301 Alzheimer's disease with late onset: Secondary | ICD-10-CM | POA: Diagnosis not present

## 2020-12-16 DIAGNOSIS — F0281 Dementia in other diseases classified elsewhere with behavioral disturbance: Secondary | ICD-10-CM | POA: Diagnosis not present

## 2020-12-16 MED ORDER — TRAZODONE HCL 50 MG PO TABS: 50.0000 mg | ORAL_TABLET | Freq: Every day | ORAL | 11 refills | Status: DC

## 2020-12-16 NOTE — Patient Instructions (Signed)
1. Stop the Benadryl  2. Stop Memantine  3. Start Trazodone 25m: Take 1 tablet every night to hopefully help with sleep  4. Continue all your other medications, follow-up with Psychiatry as scheduled  5. Follow-up in 6 months, call for any changes   FALL PRECAUTIONS: Be cautious when walking. Scan the area for obstacles that may increase the risk of trips and falls. When getting up in the mornings, sit up at the edge of the bed for a few minutes before getting out of bed. Consider elevating the bed at the head end to avoid drop of blood pressure when getting up. Walk always in a well-lit room (use night lights in the walls). Avoid area rugs or power cords from appliances in the middle of the walkways. Use a walker or a cane if necessary and consider physical therapy for balance exercise. Get your eyesight checked regularly.  HOME SAFETY: Consider the safety of the kitchen when operating appliances like stoves, microwave oven, and blender. Consider having supervision and share cooking responsibilities until no longer able to participate in those. Accidents with firearms and other hazards in the house should be identified and addressed as well.  ABILITY TO BE LEFT ALONE: If patient is unable to contact 911 operator, consider using LifeLine, or when the need is there, arrange for someone to stay with patients. Smoking is a fire hazard, consider supervision or cessation. Risk of wandering should be assessed by caregiver and if detected at any point, supervision and safe proof recommendations should be instituted.  RECOMMENDATIONS FOR ALL PATIENTS WITH MEMORY PROBLEMS: 1. Continue to exercise (Recommend 30 minutes of walking everyday, or 3 hours every week) 2. Increase social interactions - continue going to [place] enjoy social gatherings with friends and family 3. Eat healthy, avoid fried foods and eat more fruits and vegetables 4. Maintain adequate blood pressure, blood sugar, and blood  cholesterol level. Reducing the risk of stroke and cardiovascular disease also helps promoting better memory. 5. Avoid stressful situations. Live a simple life and avoid aggravations. Organize your time and prepare for the next day in anticipation. 6. Sleep well, avoid any interruptions of sleep and avoid any distractions in the bedroom that may interfere with adequate sleep quality 7. Avoid sugar, avoid sweets as there is a strong link between excessive sugar intake, diabetes, and cognitive impairment The Mediterranean diet has been shown to help patients reduce the risk of progressive memory disorders and reduces cardiovascular risk. This includes eating fish, eat fruits and green leafy vegetables, nuts like almonds and hazelnuts, walnuts, and also use olive oil. Avoid fast foods and fried foods as much as possible. Avoid sweets and sugar as sugar use has been linked to worsening of memory function.  There is always a concern of gradual progression of memory problems. If this is the case, then we may need to adjust level of care according to patient needs. Support, both to the patient and caregiver, should then be put into place.
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
## 2020-12-16 NOTE — Telephone Encounter (Signed)
Patient appt today with Dr. Delice Lesch

## 2020-12-16 NOTE — Patient Outreach (Signed)
[facility]) Care Management  [facility] Social Work  12/16/2020  [name] BUCKMAN Sr. 1950/10/31 [id number]  Wife would like to obtain results of patient's neurology consult today, before making a decision about long-term care placement arrangements versus in-home care services.  Encounter Medications:  Outpatient Encounter Medications as of 12/16/2020  Medication Sig  . acetaminophen (TYLENOL) 325 MG tablet Take 650 mg by mouth every 6 (six) hours as needed.  Marland Kitchen apixaban (ELIQUIS) 5 MG TABS tablet Take 1 tablet (5 mg total) by mouth 2 (two) times daily.  Marland Kitchen atorvastatin (LIPITOR) 20 MG tablet Take 1 tablet (20 mg total) by mouth daily.  Marland Kitchen azaTHIOprine (IMURAN) 50 MG tablet Take 200 mg by mouth every morning.  . cefdinir (OMNICEF) 300 MG capsule Take 1 capsule (300 mg total) by mouth 2 (two) times daily.  . Cholecalciferol (VITAMIN D3) 50 MCG (2000 UT) CAPS Take by mouth.  . diphenhydrAMINE (BENADRYL) 25 mg capsule Take 100 mg by mouth at bedtime. For sleep  . divalproex (DEPAKOTE SPRINKLE) 125 MG capsule Take 6 capsules (750 mg total) by mouth every 12 (twelve) hours.  . DULoxetine (CYMBALTA) 60 MG capsule Take 1 capsule (60 mg total) by mouth daily.  Marland Kitchen levothyroxine (SYNTHROID) 50 MCG tablet Take 1 tablet (50 mcg total) by mouth daily before breakfast.  . Melatonin 10 MG TABS Take by mouth.  . memantine (NAMENDA) 5 MG tablet Take 2.5 mg by mouth daily.  Marland Kitchen OLANZapine (ZYPREXA) 10 MG tablet 1/2 tab in the morning and 1.5 tabs QHS  . potassium chloride (KLOR-CON M10) 10 MEQ tablet Take 1 tablet (10 mEq total) by mouth daily.  . sitaGLIPtin (JANUVIA) 25 MG tablet Take 25 mg by mouth daily. 1/2 tab po qd  . tamsulosin (FLOMAX) 0.4 MG CAPS capsule Take 1 capsule (0.4 mg total) by mouth daily.  . traZODone (DESYREL) 50 MG tablet Take 1 tablet (50 mg total) by mouth at bedtime.   No facility-administered encounter medications on file as of 12/16/2020.    Functional Status:  In your  present state of health, do you have any difficulty performing the following activities: 12/12/2020 07/13/2020  Hearing? N N  Vision? N N  Difficulty concentrating or making decisions? Y Y  Comment dementia -  Walking or climbing stairs? 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 Y N  Comment spouse assists -  Doing errands, shopping? Y N  Comment spouse assists due to dementia -  Conservation officer, nature and eating ? Y -  Comment spouse assists -  Using the Toilet? Y -  Comment incontinence-uses diapers at night -  In the past six months, have you accidently leaked urine? Y -  Comment wears diapers QHS -  Do you have problems with loss of bowel control? N -  Managing your Medications? Y -  Comment spouse manages -  Managing your Finances? Y -  Comment dementia-spouse manages -  Housekeeping or managing your Housekeeping? Y -  Comment spouse manages due to dementia -  Some recent data might be hidden    Fall/Depression Screening:  PHQ 2/9 Scores 12/13/2020 12/12/2020 06/30/2020 12/09/2019 05/26/2019 11/17/2018 05/14/2018  PHQ - 2 Score 1 - 6 1 1 1 4   PHQ- 9 Score - - 21 5 7 4 8   Exception Documentation Medical reason Other- indicate reason in comment box - - - - -  Not completed Verified by wife - [place].  Patient has  Dementia and unable to answer questions appropriately and accurately. call completed with spouse - - - - -    Assessment:  Goals Addressed            This Visit's Progress   . Maintain Quality of Life through [facility] versus Long-Term Memory Care Assisted Living Placement.   On track    Timeframe:  Short-Term Goal Priority:  High Start Date:   12/13/2020                        Expected End Date:  02/13/2021                    Follow Up Date:   12/30/2020 at 12:30pm.  Patient Goals: . Patient and wife will decide whether or not they would like for LCSW to assist them with arranging in-home care services, or  receive assistance with arranging long-term memory care assisted living facility placement for patient. . Patient's wife will consider applying for [place] Medicaid, through the [place]. Marland Kitchen LCSW will review contents of resource packet with patient's wife to ensure understanding and assist with application completion/submission, during the next scheduled telephone outreach call.       Follow-up:  Patient agrees to Care Plan and Follow-up.  LCSW will follow-up with patient's wife on 12/30/2020 at 12:30pm.

## 2020-12-19 ENCOUNTER — Other Ambulatory Visit: Payer: Self-pay

## 2020-12-19 MED ORDER — OLANZAPINE 10 MG PO TABS: ORAL_TABLET | ORAL | 0 refills | Status: DC

## 2020-12-21 ENCOUNTER — Encounter: Payer: Self-pay | Admitting: Family Medicine

## 2020-12-21 ENCOUNTER — Other Ambulatory Visit: Payer: Self-pay

## 2020-12-21 ENCOUNTER — Ambulatory Visit (INDEPENDENT_AMBULATORY_CARE_PROVIDER_SITE_OTHER): Payer: Medicare HMO | Admitting: Family Medicine

## 2020-12-21 ENCOUNTER — Telehealth: Payer: Self-pay | Admitting: Cardiovascular Disease

## 2020-12-21 VITALS — BP 94/56 | HR 92 | Temp 98.0°F | Wt 242.0 lb

## 2020-12-21 DIAGNOSIS — F339 Major depressive disorder, recurrent, unspecified: Secondary | ICD-10-CM

## 2020-12-21 DIAGNOSIS — T391X1A Poisoning by 4-Aminophenol derivatives, accidental (unintentional), initial encounter: Secondary | ICD-10-CM | POA: Diagnosis not present

## 2020-12-21 DIAGNOSIS — Z6841 Body Mass Index (BMI) 40.0 and over, adult: Secondary | ICD-10-CM

## 2020-12-21 DIAGNOSIS — N400 Enlarged prostate without lower urinary tract symptoms: Secondary | ICD-10-CM

## 2020-12-21 DIAGNOSIS — I4891 Unspecified atrial fibrillation: Secondary | ICD-10-CM | POA: Diagnosis not present

## 2020-12-21 DIAGNOSIS — R600 Localized edema: Secondary | ICD-10-CM

## 2020-12-21 DIAGNOSIS — G4700 Insomnia, unspecified: Secondary | ICD-10-CM | POA: Diagnosis not present

## 2020-12-21 DIAGNOSIS — E119 Type 2 diabetes mellitus without complications: Secondary | ICD-10-CM

## 2020-12-21 DIAGNOSIS — E038 Other specified hypothyroidism: Secondary | ICD-10-CM

## 2020-12-21 DIAGNOSIS — Z79899 Other long term (current) drug therapy: Secondary | ICD-10-CM

## 2020-12-21 DIAGNOSIS — I700 Atherosclerosis of aorta: Secondary | ICD-10-CM

## 2020-12-21 DIAGNOSIS — Z7901 Long term (current) use of anticoagulants: Secondary | ICD-10-CM

## 2020-12-21 DIAGNOSIS — R8290 Unspecified abnormal findings in urine: Secondary | ICD-10-CM | POA: Diagnosis not present

## 2020-12-21 DIAGNOSIS — E7800 Pure hypercholesterolemia, unspecified: Secondary | ICD-10-CM

## 2020-12-21 DIAGNOSIS — E063 Autoimmune thyroiditis: Secondary | ICD-10-CM

## 2020-12-21 DIAGNOSIS — F0390 Unspecified dementia without behavioral disturbance: Secondary | ICD-10-CM

## 2020-12-21 DIAGNOSIS — F411 Generalized anxiety disorder: Secondary | ICD-10-CM | POA: Diagnosis not present

## 2020-12-21 DIAGNOSIS — I959 Hypotension, unspecified: Secondary | ICD-10-CM

## 2020-12-21 LAB — COMPREHENSIVE METABOLIC PANEL
ALT: 6 U/L (ref 0–53)
AST: 14 U/L (ref 0–37)
Albumin: 3.9 g/dL (ref 3.5–5.2)
Alkaline Phosphatase: 42 U/L (ref 39–117)
BUN: 7 mg/dL (ref 6–23)
CO2: 31 mEq/L (ref 19–32)
Calcium: 9.2 mg/dL (ref 8.4–10.5)
Chloride: 93 mEq/L — ABNORMAL LOW (ref 96–112)
Creatinine, Ser: 0.9 mg/dL (ref 0.40–1.50)
GFR: 85.81 mL/min (ref >60.00)
Glucose, Bld: 77 mg/dL (ref 70–99)
Potassium: 4.2 mEq/L (ref 3.5–5.1)
Sodium: 131 mEq/L — ABNORMAL LOW (ref 135–145)
Total Bilirubin: 1.3 mg/dL — ABNORMAL HIGH (ref 0.2–1.2)
Total Protein: 6.2 g/dL (ref 6.0–8.3)

## 2020-12-21 NOTE — Patient Instructions (Addendum)
Call Dr. Claiborne Billings and get in to be seen for his low BP, edema in legs and a.fib.     [email or web address]  Dementia Caregiver Guide Dementia is a term used to describe a number of symptoms that affect memory and thinking. The most common symptoms include:  Memory loss.  Trouble with language and communication.  Trouble concentrating.  Poor judgment and problems with reasoning.  Wandering from home or public places.  Extreme anxiety or depression.  Being suspicious or having angry outbursts and accusations.  Child-like behavior and language. Dementia can be frightening and confusing. And taking care of someone with dementia can be challenging. This guide provides tips to help you when providing care for a person with dementia. How to help manage lifestyle changes Dementia usually gets worse slowly over time. In the early stages, people with dementia can stay independent and safe with some help. In later stages, they need help with daily tasks such as dressing, grooming, and using the bathroom. There are actions you can take to help a person manage his or her life while living with this condition. Communicating  When the person is talking or seems frustrated, make eye contact and hold the person's hand.  Ask specific questions that need yes or no answers.  Use simple words, short sentences, and a calm voice. Only give one direction at a time.  When offering choices, limit the person to just one or two.  Avoid correcting the person in a negative way.  If the person is struggling to find the right words, gently try to help him or her. Preventing injury  Keep floors clear of clutter. Remove rugs, magazine racks, and floor lamps.  Keep hallways well lit, especially at night.  Put a handrail and nonslip mat in the bathtub or shower.  Put childproof locks on cabinets that contain dangerous items, such as medicines, alcohol, guns, toxic  cleaning items, sharp tools or utensils, matches, and lighters.  For doors to the outside of the house, put the locks in places where the person cannot see or reach them easily. This will help ensure that the person does not wander out of the house and get lost.  Be prepared for emergencies. Keep a list of emergency phone numbers and addresses in a convenient area.  Remove car keys and lock garage doors so that the person does not try to get in the car and drive.  Have the person wear a bracelet that tracks locations and identifies the person as having memory problems. This should be worn at all times for safety.   Helping with daily life  Keep the person on track with his or her routine.  Try to identify areas where the person may need help.  Be supportive, patient, calm, and encouraging.  Gently remind the person that adjusting to changes takes time.  Help with the tasks that the person has asked for help with.  Keep the person involved in daily tasks and decisions as much as possible.  Encourage conversation, but try not to get frustrated if the person struggles to find words or does not seem to appreciate your help.   How to recognize stress Look for signs of stress in yourself and in the person you are caring for. If you notice signs of stress, take steps to manage it. Symptoms of stress include:  Feeling anxious, irritable, frustrated, or angry.  Denying that the person has dementia or that his or her symptoms will not improve.  Feeling depressed,  hopeless, or unappreciated.  Difficulty sleeping.  Difficulty concentrating.  Developing stress-related health problems.  Feeling like you have too little time for your own life. Follow these instructions at home: Take care of your health Make sure that you and the person you are caring for:  Get regular sleep.  Exercise regularly.  Eat regular, nutritious meals.  Take over-the-counter and prescription medicines only  as told by your health care providers.  Drink enough fluid to keep your urine pale yellow.  Attend all scheduled health care appointments.   General instructions  Join a support group with others who are caregivers.  Ask about respite care resources. Respite care can provide short-term care for the person so that you can have a regular break from the stress of caregiving.  Consider any safety risks and take steps to avoid them.  Organize medicines in a pill box for each day of the week.  Create a plan to handle any legal or financial matters. Get legal or financial advice if needed.  Keep a calendar in a central location to remind the person of appointments or other activities. Where to find support: Many individuals and organizations offer support. These include:  Support groups for people with dementia.  Support groups for caregivers.  Counselors or therapists.  Home health care services.  Adult day care centers. Where to find more information  Centers for Disease Control and Prevention: http://www.wolf.info/  Alzheimer's Association: [place].co.nz  Family Caregiver Alliance: www.caregiver.[place]: www.alzfdn.org Contact a health care provider if:  The person's health is rapidly getting worse.  You are no longer able to care for the person.  Caring for the person is affecting your physical and emotional health.  You are feeling depressed or anxious about caring for the person. Get help right away if:  The person threatens himself or herself, you, or anyone else.  You feel depressed or sad, or feel that you want to harm yourself. If you ever feel like your loved one may hurt himself or herself or others, or if he or she shares thoughts about taking his or her own life, get help right away. You can go to your nearest emergency department or:  Call your local emergency services (911 in the [place]  Call a suicide crisis helpline, such as the  [place] at 918-028-1521. This is open 24 hours a day in the [place]  Text the Crisis Text Line at (970)183-4237 (in the [place].). Summary  Dementia is a term used to describe a number of symptoms that affect memory and thinking.  Dementia usually gets worse slowly over time.  Take steps to reduce the person's risk of injury and to plan for future care.  Caregivers need support, relief from caregiving, and time for their own lives. This information is not intended to replace advice given to you by your health care provider. Make sure you discuss any questions you have with your health care provider. Document Revised: 01/11/2020 Document Reviewed: 01/11/2020 Elsevier Patient Education  [place].

## 2020-12-21 NOTE — Progress Notes (Signed)
[name] Flesher Sr. , April 06, 1951, 70 y.o., male MRN: [id number] Patient Care Team    Relationship Specialty Notifications Start End  [name], DO PCP - General Family Medicine  11/08/17   Troy Sine, MD PCP - Cardiology Cardiology Admissions 02/17/19   [name], MD Consulting Physician Neurology  11/08/17   Clarene Essex, MD Consulting Physician Gastroenterology  11/29/17   Melissa Noon, [place] Referring Physician Optometry  06/30/20   Rod Can, MD Consulting Physician Orthopedic Surgery  06/30/20   Ashley Royalty, [place]  Physician Assistant  06/30/20   Florance, Tomasa Blase, [facility]   12/09/20   Saporito, Joanna D, [facility] Care Management   12/12/20   Cameron Sprang, MD Consulting Physician Neurology  12/16/20     Chief Complaint  Patient presents with  . Recurrent UTI     Subjective:  [name] BRINGHURST Sr.  is a 70 y.o. male presents for chronic condition follow-up. Major depression, recurrent, chronic (HCC)/anxiety/dementia without behavioral disturbances/insomnia Since wife reports she is starting to feel overwhelmed.  She is having difficulty managing her husband's dementia.  He has started to wander.  She had locks installed in the home to prevent him from leaving unassisted.  She states he left once and he was found walking down the side of the road. She states she cannot seem to get him to get up out of the chair and do much of anything anymore (for himself).  He is having urinary incontinence.  He is losing weight and does not want to eat frequently.  He has now established with neurology and psychiatry. Prior note: Wife states patient is doing very well.  He is tolerating taking the medication regimen despite the amount of pill load.  He is now sleeping well through the night. He is still having many moments of confusion and keeps asking her when are they going home, when they are in their home that they have  lived in for many years psych referral in place since 06/2020- denied by Dr.Akhtar and [facility].  They also have not heard from neurology since hospital discharge for his dementia.   BPH/urinary incontinence: States that he has been doing well on the Flomax as far as his BPH goes.  He has had a recent urinary tract infection completed Omnicef.  He is still having urinary incontinence.  Chronic anticoagulation/A. Fib/hyperlipidemia/statin therapy/hypotension Has not needed to restart medications for his A. fib.  He has been compliant with his Eliquis.  His blood pressures are still soft today, however they have been normal at other visits in the system.  Hypothyroidism, unspecified type He has been compliant  with levothyroxine 50 mcg daily-new dose as of 2 weeks ago.  Diabetes mellitus without complication (HCC)/obesity Now diet controlled  Left large toe injury: Resolved with treatment last week.  Tylenol overdose?:  Patient's wife states that she found a cup this morning that when she went to pour out the contents had a thick paste solution at the bottom which she figured to be Tylenol tabs that were dissolved.  She states that the Tylenol bottle was left down by the sink, so she assumes he got into the medicine cabinet and put Tylenol in his cup, but she is uncertain how it came about.  No results for input(s): HGB, HCT, WBC, PLT in the last 168 hours. CMP Latest Ref Rng & Units 12/07/2020 10/12/2020 09/06/2020  Glucose 70 -  99 mg/dL 86 101(H) 145(H)  BUN 6 - 23 mg/dL 9 6(L) 7(L)  Creatinine 0.40 - 1.50 mg/dL 0.91 0.80 1.05  Sodium 135 - 145 mEq/L 137 138 139  Potassium 3.5 - 5.1 mEq/L 4.1 4.1 4.0  Chloride 96 - 112 mEq/L 97 98 100  CO2 19 - 32 mEq/L _0 Calcium 8.4 - 10.5 mg/dL 9.1 8.8 8.8(L)  Total Protein 6.0 - 8.3 g/dL 6.1 5.5(L) -  Total Bilirubin 0.2 - 1.2 mg/dL 0.7 0.6 -  Alkaline Phos 39 - 117 U/L 36(L) - -  AST 0 - 37 U/L 14 13 -  ALT 0 - 53 U/L 5 4(L) -    CT  Head Wo Contrast  Result Date: 12/13/2020 CLINICAL DATA:  [place] in the bathroom and hit head. EXAM: CT HEAD WITHOUT CONTRAST TECHNIQUE: Contiguous axial images were obtained from the base of the skull through the vertex without intravenous contrast. COMPARISON:  07/13/2020 FINDINGS: Brain: Stable mild age advanced cerebral atrophy, ventriculomegaly and periventricular white matter disease. No extra-axial fluid collections are identified. No CT findings for acute hemispheric infarction or intracranial hemorrhage. No mass lesions. The brainstem and cerebellum are normal. Vascular: Stable vascular calcifications. No aneurysm or hyperdense vessels. Skull: No skull fracture or bone lesions. Sinuses/Orbits: The paranasal sinuses and mastoid air cells are clear. The globes are intact. Other: No scalp lesions or scalp hematoma or obvious laceration. IMPRESSION: 1. Stable mild age advanced cerebral atrophy, ventriculomegaly and periventricular white matter disease. 2. No acute intracranial findings or skull fracture. Electronically Signed   By: Marijo Sanes M.D.   On: 12/13/2020 18:05     Depression screen [facility] 2/9 12/13/2020 06/30/2020 12/09/2019 05/26/2019 11/17/2018  Decreased Interest 1 3 0 0 0  Down, Depressed, Hopeless 0 _1 PHQ - 2 Score _2 Altered sleeping - 3 0 1 1  Tired, decreased energy - _3 Change in appetite - _4 0  Feeling bad or failure about yourself  - 3 0 1 1  Trouble concentrating - _5 0  Moving slowly or fidgety/restless - 2 0 1 0  Suicidal thoughts - 1 0 0 0  PHQ-9 Score - _6 Difficult doing work/chores - - Not difficult at all Somewhat difficult Somewhat difficult    Allergies  Allergen Reactions  . Losartan Potassium-Hctz Diarrhea  . Nsaids     On eliquis.   Marland Kitchen Zoloft [Sertraline Hcl] Diarrhea  . Benzodiazepines Other (See Comments)    Agitation.   Social History   Tobacco Use  . Smoking status: Former Smoker    Packs/day: 1.00    Years: 19.00     Pack years: 19.00    Quit date: 02/25/1982    Years since quitting: 38.8  . Smokeless tobacco: Former Systems developer    Quit date: 1990  Substance Use Topics  . Alcohol use: Yes    Comment: occ   Past Medical History:  Diagnosis Date  . Acute delirium 07/14/2020  . Adenomatous colon polyp 2016  . Allergy   . AMS (altered mental status) 07/13/2020  . Anxiety   . Bell palsy    resolved  . Bell's palsy 09/14/2020  . Chicken pox   . Concussion with no loss of consciousness 07/07/2018  . Depression   . Diet-controlled diabetes mellitus ([place])   . Eating disorder   . Essential hypertension 11/29/2017  . Gastro-esophageal reflux  disease without esophagitis 09/14/2020  . GERD (gastroesophageal reflux disease)   . History of vitamin D deficiency   . Hyperlipidemia   . Hypertension   . Hypokalemia   . Memory loss   . MI (myocardial infarction) ([place])    per pt   . Neoplasm of uncertain behavior of skin 09/14/2020  . Personal history of colonic polyps 09/14/2020  . Ulcerative colitis ([place]) 2005-2006   severe    Past Surgical History:  Procedure Laterality Date  . COLONOSCOPY WITH PROPOFOL N/A 07/15/2014   Procedure: COLONOSCOPY WITH PROPOFOL;  Surgeon: [name], MD;  Location: [facility];  Service: Endoscopy;  Laterality: N/A;  . COLONOSCOPY WITH PROPOFOL N/A 08/22/2015   Procedure: COLONOSCOPY WITH PROPOFOL;  Surgeon: [name], MD;  Location: [facility];  Service: Endoscopy;  Laterality: N/A;  . ORIF ANKLE FRACTURE  02/27/2012   Procedure: OPEN REDUCTION INTERNAL FIXATION (ORIF) ANKLE FRACTURE;  Surgeon: Colin Rhein, MD;  Location: [facility];  Service: Orthopedics;  Laterality: Left;  ORIF left bimalleolar ankle fracture  . SHOULDER SURGERY     LEFT  . SIGMOIDOSCOPY  10/08/2018   Externa and Internal Hmorrhoids. Tubular and Tublovillous adenoma removed from transverse colon. Inflammatory pseudopolyps, negative for dysplasia  . TONSILLECTOMY    . UPPER  GASTROINTESTINAL ENDOSCOPY     Family History  Problem Relation Age of Onset  . Dementia Mother   . Hypertension Mother   . Early death Father 47       drowning   . Post-traumatic stress disorder Brother   . Neuropathy Brother    Allergies as of 12/21/2020      Reactions   Losartan Potassium-hctz Diarrhea   Nsaids    On eliquis.    Zoloft [sertraline Hcl] Diarrhea   Benzodiazepines Other (See Comments)   Agitation.      Medication List       Accurate as of December 21, 2020 11:16 AM. If you have any questions, ask your nurse or doctor.        STOP taking these medications   acetaminophen 325 MG tablet Commonly known as: TYLENOL Stopped by: Howard Pouch, DO   cefdinir 300 MG capsule Commonly known as: OMNICEF Stopped by: Howard Pouch, DO   diphenhydrAMINE 25 mg capsule Commonly known as: BENADRYL Stopped by: Howard Pouch, DO   memantine 5 MG tablet Commonly known as: NAMENDA Stopped by: Howard Pouch, DO   sitaGLIPtin 25 MG tablet Commonly known as: JANUVIA Stopped by: Howard Pouch, DO     TAKE these medications   apixaban 5 MG Tabs tablet Commonly known as: ELIQUIS Take 1 tablet (5 mg total) by mouth 2 (two) times daily.   atorvastatin 20 MG tablet Commonly known as: LIPITOR Take 1 tablet (20 mg total) by mouth daily.   azaTHIOprine 50 MG tablet Commonly known as: IMURAN Take 200 mg by mouth every morning.   divalproex 125 MG capsule Commonly known as: DEPAKOTE SPRINKLE Take 6 capsules (750 mg total) by mouth every 12 (twelve) hours.   DULoxetine 60 MG capsule Commonly known as: CYMBALTA Take 1 capsule (60 mg total) by mouth daily.   levothyroxine 50 MCG tablet Commonly known as: Synthroid Take 1 tablet (50 mcg total) by mouth daily before breakfast.   Melatonin 10 MG Tabs Take by mouth.   OLANZapine 10 MG tablet Commonly known as: ZYPREXA 1/2 tab in the morning and 1.5 tabs QHS   potassium chloride 10 MEQ tablet Commonly known as:  Klor-Con M10 Take 1 tablet (10 mEq total) by mouth daily.   tamsulosin 0.4 MG Caps capsule Commonly known as: FLOMAX Take 1 capsule (0.4 mg total) by mouth daily.   traZODone 50 MG tablet Commonly known as: DESYREL Take 1 tablet (50 mg total) by mouth at bedtime.   vitamin D3 50 MCG (2000 UT) Caps Take by mouth.       All past medical history, surgical history, allergies, family history, immunizations and medications were updated in the EMR today and reviewed under the history and medication portions of their EMR.      ROS: Negative, with the exception of above mentioned in HPI   Objective:  BP (!) 94/56   Pulse 92   Temp 98 F (36.7 C) (Oral)   Wt 242 lb (109.8 kg)   SpO2 98%   BMI 36.80 kg/m  Body mass index is 36.8 kg/m. Gen: Afebrile. No acute distress.  Nontoxic, falling asleep during visit. HENT: AT. [place]. Eyes:Pupils Equal Round Reactive to light, Extraocular movements intact,  Conjunctiva without redness, discharge or icterus. CV: irreg irregular.  +1 edema Chest: CTAB, no wheeze or crackles  Neuro: Shuffle gait.  Sleepy, oriented to person  Assessment/Plan: [name] Flesher Sr. is a 70 y.o. male present for OV for Hospital discharge follow up Major depression, recurrent, chronic (HCC)/anxiety//insomnia They have established with [facility] have had their first intake appointment and have another appointment coming up with his psychiatrist.  Encouraged melatonin use and maintaining a regular sleep, eat, wake schedule etc. Continue Zyprexa, Depakote, Cymbalta 60 mg daily>  new psychiatry team which take over management and prescribing QT prolongation-precautions on medication choices   dementia without behavioral disturbances -thankfully he has now seen neuro and been contacted by social services as well. They have doiscussed dementia late stages with wife and are starting to provide her with resources. She is also speaking to a lawyer to help with the process of  medicaid and researching placement options for him.  Care coordination referral placed last visit.  Social service information and referral placed last visit.   BPH: Stable, continue on Flomax 0.4 mg daily.  Urinary incontinence He was treated appropriately for UTI last visit by culture. He is still having incontinence which is most likely part of his late stage dementia process. Will check urine today to ensure infection is cleared.   Chronic anticoagulation/A. Fib/hyperlipidemia/statin therapy/hypotension, lower extremity edema A.fib present, +1 pitting edema still present. BP soft again today.  They will call cardio to get scheduled - referral placed last visit.  Continue Eliquis 5 mg twice daily Continue Lipitor 20 mg daily> could likely dc with advanced dementia.  Referral to cardiology placed  Hypothyroidism, unspecified type Continue levothyroxine 50 mcg daily.  TSH above goal 12/07/2020 and dose was increased. Rpt labs in 4 weeks by lab appt only > order placed.   Diabetes mellitus without complication (HCC)/obesity dc'd Januvia last appt- a1c 5.3  Left foot injury: Resolved.   ?  Tylenol consumption/overdose-unintentional: We will test CMP and Tylenol level today.  He is at his baseline today.  Patient's wife does not think he drank the contents of the cup she found.  We will test the above labs to be certain.     Reviewed expectations re: course of current medical issues.  Discussed self-management of symptoms.  Outlined signs and symptoms indicating need for more acute intervention.  Patient verbalized understanding and all questions were answered.  Patient received an After-Visit Summary.  Any changes in medications were reviewed and patient was provided with updated med list with their AVS.      Orders Placed This Encounter  Procedures  . Urinalysis w microscopic + reflex cultur  . TSH  . Comp Met (CMET)  . Acetaminophen level   No orders of the  defined types were placed in this encounter.  Note is dictated utilizing voice recognition software. Although note has been proof read prior to signing, occasional typographical errors still can be missed. If any questions arise, please do not hesitate to call for verification.   electronically signed by:  Howard Pouch, DO  [place]

## 2020-12-21 NOTE — Telephone Encounter (Signed)
Patient c/o Palpitations:  High priority if patient c/o lightheadedness, shortness of breath, or chest pain  1) How long have you had palpitations/irregular HR/ Afib? Are you having the symptoms now? yes  2) Are you currently experiencing lightheadedness, SOB or CP? Lightheadedness, sob, dizznesss  3) Do you have a history of afib (atrial fibrillation) or irregular heart rhythm? yes  4) Have you checked your BP or HR? (document readings if available): Yes 94/56  5) Are you experiencing any other symptoms? Retaining fluid. On Saturday he stood up an fell and had to go to ER

## 2020-12-21 NOTE — Telephone Encounter (Signed)
Spoke with pt who report for the past 2 weeks, pt has been experiencing bilateral leg swelling up to calf and SOB with exertion. She also report pt BP has been low and today BP 94/56. Pt was evaluated by primary care today and advised to schedule an appointment with cardiologist.    Appointment scheduled for first available on 5/10 but advised to report ER if symptoms worsen. Wife verbalized understanding.  Will route to MD for any further evaluations.

## 2020-12-22 ENCOUNTER — Telehealth: Payer: Self-pay | Admitting: Family Medicine

## 2020-12-22 NOTE — Telephone Encounter (Signed)
Please inform patient's wife Her husband's sodium and chloride levels are mildly low.  I would encourage her to try to get him to drink sports drink such as Gatorade to help replace electrolytes and sodium.  There are multiple causes of potential low sodium including some of the meds he takes for his mental health.  If sodium gets too low it can actually make his confusion worse.  Therefore is important to attempt to keep electrolytes within normal range.  His bilirubin is mildly elevated.  Currently not concerning at this level, but if it would continue to increase, it would be more concerning.  His other liver enzymes are normal which is reassuring.  His urinalysis is normal, there is no signs of infection any longer.  Lastly, his acetaminophen levels are still pending.  If they are abnormal they will receive a call.   -In the meantime she should monitor him for any complaints  of  nausea, vomiting or abdominal pain.  If any of these occur or he appears acutely more confused than his normal or complains of a headache,  they should be seen emergently, as those are signs of either lowering sodium or Tylenol poisoning.

## 2020-12-22 NOTE — Telephone Encounter (Signed)
LVM for pt to CB regarding results.  

## 2020-12-23 NOTE — Telephone Encounter (Signed)
Left message to call back  

## 2020-12-23 NOTE — Telephone Encounter (Signed)
Add support stockings until seen in office

## 2020-12-24 LAB — URINALYSIS W MICROSCOPIC + REFLEX CULTURE
Bacteria, UA: NONE SEEN /HPF
Bilirubin Urine: NEGATIVE
Glucose, UA: NEGATIVE
Hgb urine dipstick: NEGATIVE
Hyaline Cast: NONE SEEN /LPF
Leukocyte Esterase: NEGATIVE
Nitrites, Initial: NEGATIVE
Protein, ur: NEGATIVE
RBC / HPF: NONE SEEN /HPF (ref 0–2)
Specific Gravity, Urine: 1.01 (ref 1.001–1.03)
Squamous Epithelial / HPF: NONE SEEN /HPF (ref <=5)
WBC, UA: NONE SEEN /HPF (ref 0–5)
pH: 6.5 (ref 5.0–8.0)

## 2020-12-24 LAB — NO CULTURE INDICATED

## 2020-12-24 LAB — ACETAMINOPHEN LEVEL: Acetaminophen (Tylenol), Serum: <10 mg/L — ABNORMAL LOW (ref 10–20)

## 2020-12-26 NOTE — Telephone Encounter (Signed)
Routing to team pool

## 2020-12-26 NOTE — Telephone Encounter (Signed)
Left message to call back  

## 2020-12-27 NOTE — Telephone Encounter (Signed)
Spoke with patient's wife regarding results/recommendations.

## 2020-12-28 ENCOUNTER — Encounter: Payer: Self-pay | Admitting: *Deleted

## 2020-12-30 ENCOUNTER — Other Ambulatory Visit: Payer: Self-pay | Admitting: *Deleted

## 2020-12-30 ENCOUNTER — Encounter: Payer: Self-pay | Admitting: *Deleted

## 2020-12-30 NOTE — Patient Outreach (Signed)
[facility]) Care Management  [facility] Social Work  12/30/2020  [name] KRAUTER Sr. 1951/05/23 [id number]    Encounter Medications:  Outpatient Encounter Medications as of 12/30/2020  Medication Sig  . apixaban (ELIQUIS) 5 MG TABS tablet Take 1 tablet (5 mg total) by mouth 2 (two) times daily.  Marland Kitchen atorvastatin (LIPITOR) 20 MG tablet Take 1 tablet (20 mg total) by mouth daily.  Marland Kitchen azaTHIOprine (IMURAN) 50 MG tablet Take 200 mg by mouth every morning.  . Cholecalciferol (VITAMIN D3) 50 MCG (2000 UT) CAPS Take by mouth.  . divalproex (DEPAKOTE SPRINKLE) 125 MG capsule Take 6 capsules (750 mg total) by mouth every 12 (twelve) hours.  . DULoxetine (CYMBALTA) 60 MG capsule Take 1 capsule (60 mg total) by mouth daily.  Marland Kitchen levothyroxine (SYNTHROID) 50 MCG tablet Take 1 tablet (50 mcg total) by mouth daily before breakfast.  . Melatonin 10 MG TABS Take by mouth.  . OLANZapine (ZYPREXA) 10 MG tablet 1/2 tab in the morning and 1.5 tabs QHS  . potassium chloride (KLOR-CON M10) 10 MEQ tablet Take 1 tablet (10 mEq total) by mouth daily.  . tamsulosin (FLOMAX) 0.4 MG CAPS capsule Take 1 capsule (0.4 mg total) by mouth daily.  . traZODone (DESYREL) 50 MG tablet Take 1 tablet (50 mg total) by mouth at bedtime.   No facility-administered encounter medications on file as of 12/30/2020.    Functional Status:  In your present state of health, do you have any difficulty performing the following activities: 12/12/2020 07/13/2020  Hearing? N N  Vision? N N  Difficulty concentrating or making decisions? Y Y  Comment dementia -  Walking or climbing stairs? 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 Y N  Comment spouse assists -  Doing errands, shopping? Y N  Comment spouse assists due to dementia -  Conservation officer, nature and eating ? Y -  Comment spouse assists -  Using the Toilet? Y -  Comment incontinence-uses diapers at night -  In the past six  months, have you accidently leaked urine? Y -  Comment wears diapers QHS -  Do you have problems with loss of bowel control? N -  Managing your Medications? Y -  Comment spouse manages -  Managing your Finances? Y -  Comment dementia-spouse manages -  Housekeeping or managing your Housekeeping? Y -  Comment spouse manages due to dementia -  Some recent data might be hidden    Fall/Depression Screening:  PHQ 2/9 Scores 12/13/2020 12/12/2020 06/30/2020 12/09/2019 05/26/2019 11/17/2018 05/14/2018  PHQ - 2 Score 1 - 6 1 1 1 4   PHQ- 9 Score - - 21 5 7 4 8   Exception Documentation Medical reason Other- indicate reason in comment box - - - - -  Not completed Verified by wife - [place].  Patient has Dementia and unable to answer questions appropriately and accurately. call completed with spouse - - - - -    Assessment:  Goals Addressed            This Visit's Progress   . Maintain Quality of Life through [facility] versus Long-Term Memory Care Assisted Living Placement.   On track    Timeframe:  Short-Term Goal Priority:  High Start Date:   12/13/2020                        Expected End Date:  02/13/2021  Follow Up Date:   05/062022 at 12:00pm.  Patient Goals: . Your wife has decided that she would like for you to continue to reside in the home, agreeing to provide 24 hour care, supervision and maximum assistance with all activities of daily living.  These activities include: medication administration, meal preparation, bathing, dressing, laundry, grocery shopping, housekeeping duties, bill payments, transportation to and from all physician appointments, etc. . Your wife has arranged for you to receive a private in-home care aide, Monday through Friday, from 10:00am until 5:00pm, to provide you with care, supervision, and maximum assistance with medication administration, meal preparation, bathing and dressing, while she runs errands and tries to obtain respite  care for herself.   . Your wife will continue to consider applying for [place] Medicaid, through the [place], to obtain financial assistance to help pay for your long-term care in a memory care assisted living facility, in the event that she is no longer able to adequately manage your care in the home. Marland Kitchen LCSW will address your wife's concerns about whether or not you should be wearing [facility] or Stockings for Edema and Swelling with Dr. Shelva Majestic, Cardiologist. . LCSW will recommend that you receive home health physical therapy, occupational therapy and nursing services, through a home health agency of choice, by requesting an order from your Primary Care Physician, Dr. Howard Pouch. Marland Kitchen LCSW will perform a telephone outreach call with your wife again in two weeks, on Jan 13, 2021 at 12:00pm, to assess need for continued LCSW involvement.     Plan:  Patient and wife agree to Care Plan and Follow-up.  Follow Up Date:    01/13/2021 at 12:00pm.

## 2021-01-03 NOTE — Telephone Encounter (Signed)
Attempted to contact pt x 3. Left message to call back.   Pt has an appointment scheduled for 5/10

## 2021-01-06 ENCOUNTER — Other Ambulatory Visit: Payer: Self-pay

## 2021-01-06 ENCOUNTER — Ambulatory Visit (INDEPENDENT_AMBULATORY_CARE_PROVIDER_SITE_OTHER): Payer: Medicare HMO | Admitting: Family

## 2021-01-06 ENCOUNTER — Encounter: Payer: Self-pay | Admitting: Family

## 2021-01-06 ENCOUNTER — Telehealth: Payer: Self-pay

## 2021-01-06 VITALS — BP 122/88 | HR 106 | Temp 97.4°F | Ht 67.0 in | Wt 239.8 lb

## 2021-01-06 DIAGNOSIS — R319 Hematuria, unspecified: Secondary | ICD-10-CM | POA: Diagnosis not present

## 2021-01-06 LAB — POCT URINALYSIS DIP (CLINITEK)
Bilirubin, UA: NEGATIVE
Blood, UA: NEGATIVE
Glucose, UA: NEGATIVE mg/dL
Leukocytes, UA: NEGATIVE
Nitrite, UA: NEGATIVE
POC PROTEIN,UA: NEGATIVE
Spec Grav, UA: 1.015 (ref 1.010–1.025)
Urobilinogen, UA: 4 E.U./dL — AB
pH, UA: 6 (ref 5.0–8.0)

## 2021-01-06 MED ORDER — SULFAMETHOXAZOLE-TRIMETHOPRIM 800-160 MG PO TABS: 1.0000 | ORAL_TABLET | Freq: Two times a day (BID) | ORAL | 0 refills | Status: DC

## 2021-01-06 NOTE — Telephone Encounter (Signed)
[facility] - Client TELEPHONE ADVICE RECORD [facility] Patient Name: [name] [place] Gender: Male DOB: 25-Mar-1951 Age: 70 Y 46 M 29 D Return Phone Number: [phone] (Primary), [phone] (Secondary) Address: [place]/ [place]/ [place]  [address] Client [facility] - Client Client Site [facility] - Day Physician [name], [place] Contact Type Call Who Is Calling Patient / Member / Family / Caregiver Call Type Triage / Clinical Caller Name [name] Relationship To Patient Spouse Return Phone Number [phone] (Secondary) Chief Complaint Urine, Blood In Reason for Call Symptomatic / Request for Health Information Initial Comment Caller's husband has Alzheimer's. He has blood in urine and has frequent urination Translation No Nurse Assessment Nurse: Nyoka Cowden, RN, Ludger Nutting Date/Time (Eastern Time): 01/06/2021 11:05:13 AM Confirm and document reason for call. If symptomatic, describe symptoms. ---Caller's states husband has Alzheimer's. He has blood in urine and has frequent urination, pain. Symptoms started 2 days ago Does the patient have any new or worsening symptoms? ---Yes Will a triage be completed? ---Yes Related visit to physician within the last 2 weeks? ---No Does the PT have any chronic conditions? (i.e. diabetes, asthma, this includes High risk factors for pregnancy, etc.) ---Yes List chronic conditions. ---Alzheimer's, Is this a behavioral health or substance abuse call? ---No Guidelines Guideline Title Affirmed Question Affirmed Notes Nurse Date/Time (Eastern Time) Urinary Symptoms Urinating more frequently than usual (i.e., frequency) Nyoka Cowden, RN, Ludger Nutting 01/06/2021 11:07:34 AM Disp. Time Eilene Ghazi Time) Disposition Final User 01/06/2021 11:11:54 AM See PCP within 24 Hours Yes Nyoka Cowden, RN, Ludger Nutting PLEASE NOTE: All timestamps contained within this report are represented as [place] Standard  Time. CONFIDENTIALTY NOTICE: This fax transmission is intended only for the addressee. It contains information that is legally privileged, confidential or otherwise protected from use or disclosure. If you are not the intended recipient, you are strictly prohibited from reviewing, disclosing, copying using or disseminating any of this information or taking any action in reliance on or regarding this information. If you have received this fax in error, please notify us immediately by telephone so that we can arrange for its return to [place]. Phone: [phone], Toll-Free: [phone], Fax: [phone] Page: 2 of 2 Call Id: [id number] [name] Disagree/Comply Comply Caller Understands Yes PreDisposition Did not know what to do Care Advice Given Per Guideline SEE PCP WITHIN 24 HOURS: CARE ADVICE given per Urinary Symptoms (Adult) guideline. * You become worse CALL BACK IF: Comments User: Gorden Harms, RN Date/Time Eilene Ghazi Time): 01/06/2021 11:07:52 AM hx- afib, hypotension Referrals REFERRED TO PCP OFFICE

## 2021-01-06 NOTE — Progress Notes (Signed)
[name] Flesher Sr. is a 70 y.o. male with the following history as recorded in EpicCare:  Patient Active Problem List   Diagnosis Date Noted  . Lower extremity edema 12/09/2020  . Hypotension 12/09/2020  . Type 2 diabetes mellitus without complication ([place]), diet controlled 09/15/2020  . Benign prostatic hyperplasia 09/15/2020  . Chronic ulcerative pancolitis ([place]) 09/14/2020  . Hypercholesterolemia 09/14/2020  . Hypothyroidism   . Vitamin B 12 deficiency   . On statin therapy 05/26/2019  . Chronic anticoagulation 05/26/2019  . QT prolongation 02/19/2019  . Aortic atherosclerosis ([place]) 02/18/2019  . Carotid artery calcification 02/18/2019  . Cardiomegaly 02/18/2019  . Dementia ([place]) 02/17/2019  . Atrial fibrillation with rapid ventricular response ([place]) 02/17/2019  . Hypokalemia 02/17/2019  . GAD (generalized anxiety disorder) 05/14/2018  . Insomnia 11/29/2017  . Moderate obstructive sleep apnea 11/08/2017  . Class 3 severe obesity due to excess calories with body mass index (BMI) of 40.0 to 44.9 in adult ([place]) 11/08/2017  . Major depression, recurrent, chronic ([place]) 09/18/2017    Current Outpatient Medications  Medication Sig Dispense Refill  . apixaban (ELIQUIS) 5 MG TABS tablet Take 1 tablet (5 mg total) by mouth 2 (two) times daily. 60 tablet 11  . atorvastatin (LIPITOR) 20 MG tablet Take 1 tablet (20 mg total) by mouth daily. 30 tablet 11  . azaTHIOprine (IMURAN) 50 MG tablet Take 200 mg by mouth every morning.    . Cholecalciferol (VITAMIN D3) 50 MCG (2000 UT) CAPS Take by mouth.    . divalproex (DEPAKOTE SPRINKLE) 125 MG capsule Take 6 capsules (750 mg total) by mouth every 12 (twelve) hours. 360 capsule 3  . DULoxetine (CYMBALTA) 60 MG capsule Take 1 capsule (60 mg total) by mouth daily. 90 capsule 1  . levothyroxine (SYNTHROID) 50 MCG tablet Take 1 tablet (50 mcg total) by mouth daily before breakfast. 90 tablet 3  . Melatonin 10 MG TABS Take by mouth.    . OLANZapine  (ZYPREXA) 10 MG tablet 1/2 tab in the morning and 1.5 tabs QHS 180 tablet 0  . potassium chloride (KLOR-CON M10) 10 MEQ tablet Take 1 tablet (10 mEq total) by mouth daily. 90 tablet 3  . sulfamethoxazole-trimethoprim (BACTRIM DS) 800-160 MG tablet Take 1 tablet by mouth 2 (two) times daily. 10 tablet 0  . tamsulosin (FLOMAX) 0.4 MG CAPS capsule Take 1 capsule (0.4 mg total) by mouth daily. 90 capsule 1  . traZODone (DESYREL) 50 MG tablet Take 1 tablet (50 mg total) by mouth at bedtime. 30 tablet 11   No current facility-administered medications for this visit.    Allergies: Losartan potassium-hctz, Nsaids, Zoloft [sertraline hcl], and Benzodiazepines  Past Medical History:  Diagnosis Date  . Acute delirium 07/14/2020  . Adenomatous colon polyp 2016  . Allergy   . AMS (altered mental status) 07/13/2020  . Anxiety   . Bell palsy    resolved  . Bell's palsy 09/14/2020  . Chicken pox   . Concussion with no loss of consciousness 07/07/2018  . Depression   . Diet-controlled diabetes mellitus ([place])   . Eating disorder   . Essential hypertension 11/29/2017  . Gastro-esophageal reflux disease without esophagitis 09/14/2020  . GERD (gastroesophageal reflux disease)   . History of vitamin D deficiency   . Hyperlipidemia   . Hypertension   . Hypokalemia   . Memory loss   . MI (myocardial infarction) ([place])    per pt   . Neoplasm of uncertain behavior of skin 09/14/2020  .  Personal history of colonic polyps 09/14/2020  . Ulcerative colitis ([place]) 2005-2006   severe     Past Surgical History:  Procedure Laterality Date  . COLONOSCOPY WITH PROPOFOL N/A 07/15/2014   Procedure: COLONOSCOPY WITH PROPOFOL;  Surgeon: [name], MD;  Location: [facility];  Service: Endoscopy;  Laterality: N/A;  . COLONOSCOPY WITH PROPOFOL N/A 08/22/2015   Procedure: COLONOSCOPY WITH PROPOFOL;  Surgeon: [name], MD;  Location: [facility];  Service: Endoscopy;  Laterality: N/A;  . ORIF ANKLE FRACTURE   02/27/2012   Procedure: OPEN REDUCTION INTERNAL FIXATION (ORIF) ANKLE FRACTURE;  Surgeon: Colin Rhein, MD;  Location: [facility];  Service: Orthopedics;  Laterality: Left;  ORIF left bimalleolar ankle fracture  . SHOULDER SURGERY     LEFT  . SIGMOIDOSCOPY  10/08/2018   Externa and Internal Hmorrhoids. Tubular and Tublovillous adenoma removed from transverse colon. Inflammatory pseudopolyps, negative for dysplasia  . TONSILLECTOMY    . UPPER GASTROINTESTINAL ENDOSCOPY      Family History  Problem Relation Age of Onset  . Dementia Mother   . Hypertension Mother   . Early death Father 29       drowning   . Post-traumatic stress disorder Brother   . Neuropathy Brother     Social History   Tobacco Use  . Smoking status: Former Smoker    Packs/day: 1.00    Years: 19.00    Pack years: 19.00    Quit date: 02/25/1982    Years since quitting: 38.8  . Smokeless tobacco: Former Systems developer    Quit date: 1990  Substance Use Topics  . Alcohol use: Yes    Comment: occ    Subjective:  Accompanied by his wife today who provides history; patient does not provide any of the history; 1 day history of discolored urine/ blood in urine; per wife, patient's behavior is more agitated; does wear Depends- has been having increased frequency; Had UTI in March- E. Coli noted on urine culture;  repeat culture done to ensure resolution at [facility] in April; Per wife, temperature has been normal;    Objective:  Vitals:   01/06/21 1339  BP: 122/88  Pulse: (!) 106  Temp: (!) 97.4 F (36.3 C)  TempSrc: Oral  SpO2: 98%  Weight: 239 lb 12.8 oz (108.8 kg)  Height: 5' 7"  (1.702 m)    General: Well developed, well nourished, in no acute distress  Skin : Warm and dry.  Head: Normocephalic and atraumatic  Lungs: Respirations unlabored;  CVS exam: normal rate and regular rhythm.  Neurologic: Alert and oriented; speech intact; face symmetrical; moves all extremities well; CNII-XII intact without  focal deficit   Assessment:  1. Hematuria, unspecified type     Plan:  Suspect UTI; check U/A and urine culture; Rx for Bactrim DS bid x 5 days; keep planned follow up with PCP for 5/9 and follow-up urine can be done at that time;   This visit occurred during the SARS-CoV-2 public health emergency.  Safety protocols were in place, including screening questions prior to the visit, additional usage of staff PPE, and extensive cleaning of exam room while observing appropriate contact time as indicated for disinfecting solutions.     No follow-ups on file.  Orders Placed This Encounter  Procedures  . Urine Culture  . POCT URINALYSIS DIP (CLINITEK)    Requested Prescriptions   Signed Prescriptions Disp Refills  . sulfamethoxazole-trimethoprim (BACTRIM DS) 800-160 MG tablet 10 tablet 0    Sig:  Take 1 tablet by mouth 2 (two) times daily.

## 2021-01-06 NOTE — Telephone Encounter (Signed)
Pt was seen by Dr. Valere Dross. Was given sulfamethorazole-tmp ds for UTI. Dr mentioned to wife that if the UTI does not clear up with this antibiotic that it could be a possibility for him to be on a daily antibiotic. Pt does have a f/u appt with Dr. Valere Dross.

## 2021-01-06 NOTE — Telephone Encounter (Signed)
Noted.  We will discuss on his follow up here.  He saw Jodi Mourning- NP- in family medicine.  His follow up should be here and it looks like it is, please confirm it is scheduled correctly.   Thanks.

## 2021-01-07 ENCOUNTER — Other Ambulatory Visit: Payer: Self-pay | Admitting: Neurology

## 2021-01-07 LAB — URINE CULTURE
MICRO NUMBER:: 11831602
Result:: NO GROWTH
SPECIMEN QUALITY:: ADEQUATE

## 2021-01-09 ENCOUNTER — Telehealth: Payer: Self-pay

## 2021-01-09 NOTE — Telephone Encounter (Signed)
Appt confirmed

## 2021-01-09 NOTE — Telephone Encounter (Signed)
Spoke with pt's wife and she stated that she has not seen any blood in his urine since being on medication. She also stated that pt tumbled over (fell) over the weekend and she had to have her son come to get him off the floor. Over the weekend, Pt filled the tub with water and clothes and was standing in the bathroom with out clothing on.   Pt's wife states that she was told that he needed/would benefit from compression stockings and that some one would call her about getting that. Last note that mentions ted hose is with cardiology. Pt has cardiology appt on 01/17/21 for lower extremity edema. But pt still has LEE after increasing lasix. Wife states that when he has on his regular socks, they cause indentation from how swollen his legs are.   Notified wife that compression stockings can be bought OTC until discussed at cardiology eval.

## 2021-01-10 ENCOUNTER — Emergency Department (HOSPITAL_COMMUNITY): Payer: Medicare HMO

## 2021-01-10 ENCOUNTER — Encounter (HOSPITAL_COMMUNITY): Payer: Self-pay

## 2021-01-10 ENCOUNTER — Other Ambulatory Visit: Payer: Self-pay

## 2021-01-10 ENCOUNTER — Inpatient Hospital Stay (HOSPITAL_COMMUNITY)
Admission: EM | Admit: 2021-01-10 | Discharge: 2021-01-18 | DRG: 884 | Disposition: A | Discharge status: Skilled Nursing Facility | Payer: Medicare HMO | Attending: Internal Medicine | Admitting: Internal Medicine

## 2021-01-10 DIAGNOSIS — T43215A Adverse effect of selective serotonin and norepinephrine reuptake inhibitors, initial encounter: Secondary | ICD-10-CM | POA: Diagnosis present

## 2021-01-10 DIAGNOSIS — R410 Disorientation, unspecified: Secondary | ICD-10-CM | POA: Diagnosis not present

## 2021-01-10 DIAGNOSIS — I252 Old myocardial infarction: Secondary | ICD-10-CM | POA: Diagnosis not present

## 2021-01-10 DIAGNOSIS — E063 Autoimmune thyroiditis: Secondary | ICD-10-CM | POA: Diagnosis not present

## 2021-01-10 DIAGNOSIS — Z79890 Hormone replacement therapy (postmenopausal): Secondary | ICD-10-CM | POA: Diagnosis not present

## 2021-01-10 DIAGNOSIS — E119 Type 2 diabetes mellitus without complications: Secondary | ICD-10-CM | POA: Diagnosis present

## 2021-01-10 DIAGNOSIS — J189 Pneumonia, unspecified organism: Secondary | ICD-10-CM

## 2021-01-10 DIAGNOSIS — K5190 Ulcerative colitis, unspecified, without complications: Secondary | ICD-10-CM | POA: Diagnosis present

## 2021-01-10 DIAGNOSIS — I5033 Acute on chronic diastolic (congestive) heart failure: Secondary | ICD-10-CM | POA: Diagnosis not present

## 2021-01-10 DIAGNOSIS — F0281 Dementia in other diseases classified elsewhere with behavioral disturbance: Secondary | ICD-10-CM | POA: Diagnosis not present

## 2021-01-10 DIAGNOSIS — F0390 Unspecified dementia without behavioral disturbance: Principal | ICD-10-CM | POA: Diagnosis present

## 2021-01-10 DIAGNOSIS — J811 Chronic pulmonary edema: Secondary | ICD-10-CM | POA: Diagnosis not present

## 2021-01-10 DIAGNOSIS — E785 Hyperlipidemia, unspecified: Secondary | ICD-10-CM | POA: Diagnosis present

## 2021-01-10 DIAGNOSIS — I110 Hypertensive heart disease with heart failure: Secondary | ICD-10-CM | POA: Diagnosis present

## 2021-01-10 DIAGNOSIS — Y92009 Unspecified place in unspecified non-institutional (private) residence as the place of occurrence of the external cause: Secondary | ICD-10-CM | POA: Diagnosis not present

## 2021-01-10 DIAGNOSIS — T368X5A Adverse effect of other systemic antibiotics, initial encounter: Secondary | ICD-10-CM | POA: Diagnosis not present

## 2021-01-10 DIAGNOSIS — Z7901 Long term (current) use of anticoagulants: Secondary | ICD-10-CM

## 2021-01-10 DIAGNOSIS — G9341 Metabolic encephalopathy: Secondary | ICD-10-CM | POA: Diagnosis not present

## 2021-01-10 DIAGNOSIS — Z20822 Contact with and (suspected) exposure to covid-19: Secondary | ICD-10-CM | POA: Diagnosis not present

## 2021-01-10 DIAGNOSIS — R531 Weakness: Secondary | ICD-10-CM | POA: Diagnosis not present

## 2021-01-10 DIAGNOSIS — N400 Enlarged prostate without lower urinary tract symptoms: Secondary | ICD-10-CM | POA: Diagnosis not present

## 2021-01-10 DIAGNOSIS — D6869 Other thrombophilia: Secondary | ICD-10-CM

## 2021-01-10 DIAGNOSIS — R059 Cough, unspecified: Secondary | ICD-10-CM | POA: Diagnosis not present

## 2021-01-10 DIAGNOSIS — Z66 Do not resuscitate: Secondary | ICD-10-CM | POA: Diagnosis present

## 2021-01-10 DIAGNOSIS — Z87891 Personal history of nicotine dependence: Secondary | ICD-10-CM

## 2021-01-10 DIAGNOSIS — F339 Major depressive disorder, recurrent, unspecified: Secondary | ICD-10-CM | POA: Diagnosis not present

## 2021-01-10 DIAGNOSIS — F32A Depression, unspecified: Secondary | ICD-10-CM | POA: Diagnosis present

## 2021-01-10 DIAGNOSIS — Z86010 Personal history of colonic polyps: Secondary | ICD-10-CM

## 2021-01-10 DIAGNOSIS — F419 Anxiety disorder, unspecified: Secondary | ICD-10-CM | POA: Diagnosis present

## 2021-01-10 DIAGNOSIS — F05 Delirium due to known physiological condition: Secondary | ICD-10-CM | POA: Diagnosis not present

## 2021-01-10 DIAGNOSIS — E669 Obesity, unspecified: Secondary | ICD-10-CM | POA: Diagnosis present

## 2021-01-10 DIAGNOSIS — Z8249 Family history of ischemic heart disease and other diseases of the circulatory system: Secondary | ICD-10-CM

## 2021-01-10 DIAGNOSIS — E538 Deficiency of other specified B group vitamins: Secondary | ICD-10-CM | POA: Diagnosis present

## 2021-01-10 DIAGNOSIS — G4733 Obstructive sleep apnea (adult) (pediatric): Secondary | ICD-10-CM | POA: Diagnosis present

## 2021-01-10 DIAGNOSIS — Z888 Allergy status to other drugs, medicaments and biological substances status: Secondary | ICD-10-CM | POA: Diagnosis not present

## 2021-01-10 DIAGNOSIS — R2681 Unsteadiness on feet: Secondary | ICD-10-CM | POA: Diagnosis not present

## 2021-01-10 DIAGNOSIS — I480 Paroxysmal atrial fibrillation: Secondary | ICD-10-CM | POA: Diagnosis not present

## 2021-01-10 DIAGNOSIS — Z7401 Bed confinement status: Secondary | ICD-10-CM | POA: Diagnosis not present

## 2021-01-10 DIAGNOSIS — K5900 Constipation, unspecified: Secondary | ICD-10-CM | POA: Diagnosis present

## 2021-01-10 DIAGNOSIS — I509 Heart failure, unspecified: Secondary | ICD-10-CM | POA: Diagnosis not present

## 2021-01-10 DIAGNOSIS — E038 Other specified hypothyroidism: Secondary | ICD-10-CM

## 2021-01-10 DIAGNOSIS — R140 Abdominal distension (gaseous): Secondary | ICD-10-CM | POA: Diagnosis not present

## 2021-01-10 DIAGNOSIS — I5032 Chronic diastolic (congestive) heart failure: Secondary | ICD-10-CM | POA: Insufficient documentation

## 2021-01-10 DIAGNOSIS — R0902 Hypoxemia: Secondary | ICD-10-CM | POA: Diagnosis not present

## 2021-01-10 DIAGNOSIS — Z6834 Body mass index (BMI) 34.0-34.9, adult: Secondary | ICD-10-CM | POA: Diagnosis not present

## 2021-01-10 DIAGNOSIS — E039 Hypothyroidism, unspecified: Secondary | ICD-10-CM | POA: Diagnosis present

## 2021-01-10 DIAGNOSIS — R319 Hematuria, unspecified: Secondary | ICD-10-CM | POA: Diagnosis not present

## 2021-01-10 DIAGNOSIS — M6281 Muscle weakness (generalized): Secondary | ICD-10-CM | POA: Diagnosis not present

## 2021-01-10 DIAGNOSIS — D519 Vitamin B12 deficiency anemia, unspecified: Secondary | ICD-10-CM | POA: Diagnosis not present

## 2021-01-10 DIAGNOSIS — I517 Cardiomegaly: Secondary | ICD-10-CM | POA: Diagnosis not present

## 2021-01-10 DIAGNOSIS — K5100 Ulcerative (chronic) pancolitis without complications: Secondary | ICD-10-CM | POA: Diagnosis not present

## 2021-01-10 DIAGNOSIS — R404 Transient alteration of awareness: Secondary | ICD-10-CM | POA: Diagnosis not present

## 2021-01-10 DIAGNOSIS — R4182 Altered mental status, unspecified: Secondary | ICD-10-CM | POA: Diagnosis not present

## 2021-01-10 DIAGNOSIS — M2550 Pain in unspecified joint: Secondary | ICD-10-CM | POA: Diagnosis not present

## 2021-01-10 DIAGNOSIS — Z79899 Other long term (current) drug therapy: Secondary | ICD-10-CM

## 2021-01-10 HISTORY — DX: Heart failure, unspecified: I50.9

## 2021-01-10 LAB — CBC WITH DIFFERENTIAL/PLATELET
Abs Immature Granulocytes: 0.05 10*3/uL (ref 0.00–0.07)
Basophils Absolute: 0 10*3/uL (ref 0.0–0.1)
Basophils Relative: 0 %
Eosinophils Absolute: 0 10*3/uL (ref 0.0–0.5)
Eosinophils Relative: 1 %
HCT: 39 % (ref 39.0–52.0)
Hemoglobin: 13.4 g/dL (ref 13.0–17.0)
Immature Granulocytes: 1 %
Lymphocytes Relative: 7 %
Lymphs Abs: 0.4 10*3/uL — ABNORMAL LOW (ref 0.7–4.0)
MCH: 35.3 pg — ABNORMAL HIGH (ref 26.0–34.0)
MCHC: 34.4 g/dL (ref 30.0–36.0)
MCV: 102.6 fL — ABNORMAL HIGH (ref 80.0–100.0)
Monocytes Absolute: 0.4 10*3/uL (ref 0.1–1.0)
Monocytes Relative: 8 %
Neutro Abs: 4.1 10*3/uL (ref 1.7–7.7)
Neutrophils Relative %: 83 %
Platelets: 118 10*3/uL — ABNORMAL LOW (ref 150–400)
RBC: 3.8 MIL/uL — ABNORMAL LOW (ref 4.22–5.81)
RDW: 17.5 % — ABNORMAL HIGH (ref 11.5–15.5)
WBC: 5 10*3/uL (ref 4.0–10.5)
nRBC: 0 % (ref 0.0–0.2)

## 2021-01-10 LAB — URINALYSIS, ROUTINE W REFLEX MICROSCOPIC
Bilirubin Urine: NEGATIVE
Glucose, UA: NEGATIVE mg/dL
Hgb urine dipstick: NEGATIVE
Ketones, ur: 5 mg/dL — AB
Leukocytes,Ua: NEGATIVE
Nitrite: NEGATIVE
Protein, ur: NEGATIVE mg/dL
Specific Gravity, Urine: 1.004 — ABNORMAL LOW (ref 1.005–1.030)
pH: 9 — ABNORMAL HIGH (ref 5.0–8.0)

## 2021-01-10 LAB — COMPREHENSIVE METABOLIC PANEL
ALT: 10 U/L (ref 0–44)
AST: 23 U/L (ref 15–41)
Albumin: 3.5 g/dL (ref 3.5–5.0)
Alkaline Phosphatase: 32 U/L — ABNORMAL LOW (ref 38–126)
Anion gap: 11 (ref 5–15)
BUN: 8 mg/dL (ref 8–23)
CO2: 27 mmol/L (ref 22–32)
Calcium: 9.1 mg/dL (ref 8.9–10.3)
Chloride: 100 mmol/L (ref 98–111)
Creatinine, Ser: 1 mg/dL (ref 0.61–1.24)
GFR, Estimated: >60 mL/min (ref >60)
Glucose, Bld: 92 mg/dL (ref 70–99)
Potassium: 4.2 mmol/L (ref 3.5–5.1)
Sodium: 138 mmol/L (ref 135–145)
Total Bilirubin: 1.4 mg/dL — ABNORMAL HIGH (ref 0.3–1.2)
Total Protein: 5.9 g/dL — ABNORMAL LOW (ref 6.5–8.1)

## 2021-01-10 LAB — LACTIC ACID, PLASMA
Lactic Acid, Venous: 1.3 mmol/L (ref 0.5–1.9)
Lactic Acid, Venous: 1.4 mmol/L (ref 0.5–1.9)

## 2021-01-10 LAB — TROPONIN I (HIGH SENSITIVITY)
Troponin I (High Sensitivity): 4 ng/L (ref <18)
Troponin I (High Sensitivity): 5 ng/L (ref <18)

## 2021-01-10 LAB — RESP PANEL BY RT-PCR (FLU A&B, COVID) ARPGX2
Influenza A by PCR: NEGATIVE
Influenza B by PCR: NEGATIVE
SARS Coronavirus 2 by RT PCR: NEGATIVE

## 2021-01-10 LAB — MAGNESIUM: Magnesium: 1.9 mg/dL (ref 1.7–2.4)

## 2021-01-10 LAB — BRAIN NATRIURETIC PEPTIDE: B Natriuretic Peptide: 74.1 pg/mL (ref 0.0–100.0)

## 2021-01-10 MED ORDER — HALOPERIDOL LACTATE 5 MG/ML IJ SOLN
1.0000 mg | Freq: Four times a day (QID) | INTRAMUSCULAR | Status: DC | PRN
  Administered 2021-01-10: 1 mg via INTRAMUSCULAR
  Filled 2021-01-10: qty 1

## 2021-01-10 MED ORDER — SODIUM CHLORIDE 0.9 % IV SOLN
1.0000 g | Freq: Once | INTRAVENOUS | Status: AC
  Administered 2021-01-10: 1 g via INTRAVENOUS
  Filled 2021-01-10: qty 10

## 2021-01-10 MED ORDER — ACETAMINOPHEN 325 MG PO TABS: 650.0000 mg | ORAL_TABLET | Freq: Four times a day (QID) | ORAL | Status: DC | PRN

## 2021-01-10 MED ORDER — SODIUM CHLORIDE 0.9 % IV SOLN: 1.0000 g | INTRAVENOUS | Status: DC

## 2021-01-10 MED ORDER — METOPROLOL TARTRATE 5 MG/5ML IV SOLN: 5.0000 mg | INTRAVENOUS | Status: DC | PRN

## 2021-01-10 MED ORDER — SODIUM CHLORIDE 0.9 % IV SOLN
500.0000 mg | Freq: Once | INTRAVENOUS | Status: AC
  Administered 2021-01-10: 500 mg via INTRAVENOUS
  Filled 2021-01-10: qty 500

## 2021-01-10 MED ORDER — ACETAMINOPHEN 650 MG RE SUPP: 650.0000 mg | Freq: Four times a day (QID) | RECTAL | Status: DC | PRN

## 2021-01-10 MED ORDER — SODIUM CHLORIDE 0.9 % IV SOLN: 500.0000 mg | INTRAVENOUS | Status: DC

## 2021-01-10 MED ORDER — IOHEXOL 350 MG/ML SOLN
100.0000 mL | Freq: Once | INTRAVENOUS | Status: AC | PRN
  Administered 2021-01-10: 100 mL via INTRAVENOUS

## 2021-01-10 NOTE — ED Triage Notes (Signed)
Patient coming from home c/o three days of generalized weakness, not able to eat, bilateral edema. Per family patient is progressively getting worse. History of dementia.

## 2021-01-10 NOTE — Progress Notes (Signed)
Brief note regarding plan, with full H&P to follow:  70 year old male with underlying dementia associated with baseline visual hallucinations, who is admitted to [facility] for further evaluation and management of suspected acute metabolic encephalopathy in the setting of community-acquired pneumonia, after presenting from home to [facility] ED for further evaluation significant increase in frequency/intensity of his visual hallucinations over the last several days.  He was reportedly diagnosed with a urinary tract infection last week, and consequently started on Bactrim by his PCP following which the patient's wife conveys that the patient exhibited a significant increase in the frequency and intensity of his visual hallucinations relative to baseline.   In the ED today, chest x-ray suggestive of atypical pneumonia, which is suspected to represent the initial source of the patient's acute worsening in hallucinations, with additional evaluation pending for potential additional sources.  Started on Rocephin and azithromycin.   Currently n.p.o., pending nursing bedside swallow screen.      Babs Bertin, DO Hospitalist

## 2021-01-10 NOTE — H&P (Signed)
History and Physical    PLEASE NOTE THAT [employer]AGON DICTATION SOFTWARE WAS USED IN THE CONSTRUCTION OF THIS NOTE.   [name] DOB: September 03, 1951 DOA: 01/10/2021  PCP: [name], DO Patient coming from: home   I have personally briefly reviewed patient's old medical records in [place]  Chief Complaint: Worsening visual hallucinations  HPI: [name] SEHGAL Sr. is a 70 y.o. male with medical history significant for dementia with baseline visual hallucinations, hypertension, hyperlipidemia, acquired hypothyroidism, paroxysmal atrial fibrillation chronically anticoagulated on Eliquis, who is admitted to [facility] on 01/10/2021 with suspected acute metabolic encephalopathy after presenting from home to [facility] ED for evaluation of worsening visual hallucinations.   In the setting of suspected acute encephalopathy superimposed on underlying dementia, the following history is provided by the patient's wife, in addition to my discussions with the emergency department physician and via chart review.  The patient's wife conveys that the patient was diagnosed with a urinary tract infection 1 week ago, prompting him to be started on Bactrim by his PCP.  He compliantly completed his course of Bactrim, however, within a few days of starting this regimen, the patient's wife noted that the patient appeared to be more confused relative to baseline.  Additionally, over the last 2 to 3 days, he has developed worsening of his baseline visual hallucinations, with his wife stating that these appear to be occurring more frequently and more vividly, imposing more anxiety to the patient relative to his baseline response.  Specifically, the patient is aware that he has observed people walking around their bedroom at night, as well as seeing insects in the room, including large inhales.   Over the last 2 3 days, the patient's wife is noted the patient to develop new onset productive cough.   She has not noted him to be febrile.  No recent trauma.  He has not been complaining of any chest pain, shortness of breath, abdominal pain, or diarrhea.  She has not witnessed any recent rash on the patient.  No recent known COVID-19 exposures.  No evidence of nausea or vomiting.  She has not noticed the patient exhibiting evidence of wheezing.  Of note, the patient's baseline creatinine appears to be 0.8, with such a finding on 10/12/2020.  In the setting of dementia with history of visual hallucinations, his outpatient mental health regimen includes Depakote, Cymbalta, and scheduled Zyprexa.  Aside from recent course of Bactrim, patient's wife does not believe that the patient's home medication regimen is undergone any significant modifications, including no additions, subtractions, or dose modifications.  Medical history also notable for acquired hypothyroidism for which the patient is on Synthroid at home.  He also has a documented history of diet-controlled type 2 diabetes mellitus, with most recent hemoglobin A1c 5.3 on 12/07/2020.  Wife confirms that the patient is on no oral hypoglycemic agents nor any exogenous insulin at home.  Plan: He also has a history of paroxysmal atrial fibrillation, chronically anticoagulated on Eliquis.  Most recent echocardiogram was performed in June 2020 was notable for the following: Normal left ventricular cavity size, moderate concentric LVH, LVEF 60 to 18%, diastolic parameters consistent with pseudonormalization, in the left atrium was noted to be moderately dilated in the absence of any significant valvular pathology.     ED Course:  Vital signs in the ED were notable for the following:  - Temperature max 98.6, heart rate 86-1 06, blood pressure 120/77 148/89; respiratory rate 16-20; oxygen saturation  96 to 100% on room air.  Labs were notable for the following: CMP was notable for the following: Sodium 138, bicarbonate 27, creatinine 1.0 relative to most recent  prior creatinine data point of 0.8 on 10/12/2020, glucose 92.  CBC notable for white cell count of 5000.  BNP 74 relative most recent prior value 170 in June 2020.  Urinalysis was notable for no white blood cells, nitrate negative, leukocyte esterase negative.  Nasopharyngeal COVID-19/influenza PCR performed in the ED today and found to be negative.  EKG showed atrial fibrillation with ventricular rate of 116, nonspecific intraventricular conduction delay, nonspecific T wave inversion in lead III, and no evidence of ST changes, including no evidence of ST elevation.  Chest x-ray showed evidence of interstitial prominence potentially representing atypical pneumonia.  CTA chest showed no evidence of acute pulmonary embolism but showed patchy groundglass attenuation potentially representing atypical pneumonia, edema, or bronchiolitis obliterans.  CT abdomen/pelvis showed no evidence of acute intra-abdominal process.  While in the ED, the following were administered: Azithromycin, Rocephin.  Socially, the patient was admitted for further evaluation and management of presenting suspected acute metabolic encephalopathy in the setting of suspected community acquired pneumonia.    Review of Systems: As per HPI otherwise 10 point review of systems negative.   Past Medical History:  Diagnosis Date  . Acute delirium 07/14/2020  . Adenomatous colon polyp 2016  . Allergy   . AMS (altered mental status) 07/13/2020  . Anxiety   . Bell palsy    resolved  . Bell's palsy 09/14/2020  . CHF (congestive heart failure) ([place])   . Chicken pox   . Concussion with no loss of consciousness 07/07/2018  . Depression   . Diet-controlled diabetes mellitus ([place])   . Eating disorder   . Essential hypertension 11/29/2017  . Gastro-esophageal reflux disease without esophagitis 09/14/2020  . GERD (gastroesophageal reflux disease)   . History of vitamin D deficiency   . Hyperlipidemia   . Hypertension   . Hypokalemia   . Memory  loss   . MI (myocardial infarction) ([place])    per pt   . Neoplasm of uncertain behavior of skin 09/14/2020  . Personal history of colonic polyps 09/14/2020  . Ulcerative colitis ([place]) 2005-2006   severe     Past Surgical History:  Procedure Laterality Date  . COLONOSCOPY WITH PROPOFOL N/A 07/15/2014   Procedure: COLONOSCOPY WITH PROPOFOL;  Surgeon: [name], MD;  Location: [facility] END[facility]COPY;  Service: Endoscopy;  Laterality: N/A;  . COLONOSCOPY WITH PROPOFOL N/A 08/22/2015   Procedure: COLONOSCOPY WITH PROPOFOL;  Surgeon: [name], MD;  Location: [facility] ENDOSCOPY;  Service: Endoscopy;  Laterality: N/A;  . ORIF ANKLE FRACTURE  02/27/2012   Procedure: OPEN REDUCTION INTERNAL FIXATION (ORIF) ANKLE FRACTURE;  Surgeon: Colin Rhein, MD;  Location: [facility];  Service: Orthopedics;  Laterality: Left;  ORIF left bimalleolar ankle fracture  . SHOULDER SURGERY     LEFT  . SIGMOIDOSCOPY  10/08/2018   Externa and Internal Hmorrhoids. Tubular and Tublovillous adenoma removed from transverse colon. Inflammatory pseudopolyps, negative for dysplasia  . TONSILLECTOMY    . UPPER GASTROINTESTINAL ENDOSCOPY      Social History:  reports that he quit smoking about 38 years ago. He has a 19.00 pack-year smoking history. He quit smokeless tobacco use about 32 years ago. He reports current alcohol use. He reports that he does not use drugs.   Allergies  Allergen Reactions  . Losartan Potassium-Hctz Diarrhea  .  Nsaids     On eliquis.   Marland Kitchen Zoloft [Sertraline Hcl] Diarrhea  . Benzodiazepines Other (See Comments)    Agitation.    Family History  Problem Relation Age of Onset  . Dementia Mother   . Hypertension Mother   . Early death Father 36       drowning   . Post-traumatic stress disorder Brother   . Neuropathy Brother      Prior to Admission medications   Medication Sig Start Date End Date Taking? Authorizing Provider  acetaminophen (TYLENOL) 500 MG tablet Take 500 mg by  mouth every 6 (six) hours as needed for moderate pain.   Yes [provider]  apixaban (ELIQUIS) 5 MG TABS tablet Take 1 tablet (5 mg total) by mouth 2 (two) times daily. 06/30/20 06/25/21 Yes [name], [name], DO  atorvastatin (LIPITOR) 20 MG tablet Take 1 tablet (20 mg total) by mouth daily. 06/30/20  Yes [name], [name], DO  azaTHIOprine (IMURAN) 50 MG tablet Take 200 mg by mouth every morning.   Yes [provider]  Cholecalciferol (VITAMIN D3) 50 MCG (2000 UT) CAPS Take 2,000 Units by mouth daily.   Yes [provider]  divalproex (DEPAKOTE SPRINKLE) 125 MG capsule Take 6 capsules (750 mg total) by mouth every 12 (twelve) hours. 09/14/20  Yes [name], [name], DO  DULoxetine (CYMBALTA) 60 MG capsule Take 1 capsule (60 mg total) by mouth daily. 09/14/20  Yes [name], [name], DO  levocetirizine (XYZAL) 5 MG tablet Take 5 mg by mouth every evening.   Yes [provider]  levothyroxine (SYNTHROID) 50 MCG tablet Take 1 tablet (50 mcg total) by mouth daily before breakfast. 12/08/20 03/08/21 Yes [name], [name], DO  Melatonin 10 MG TABS Take 10 mg by mouth daily.   Yes [provider]  OLANZapine (ZYPREXA) 10 MG tablet 1/2 tab in the morning and 1.5 tabs QHS Patient taking differently: Take 5-15 mg by mouth See admin instructions. 1/2 tab in the morning and 1.5 tabs QHS 12/19/20  Yes [name], [name], DO  potassium chloride (KLOR-CON M10) 10 MEQ tablet Take 1 tablet (10 mEq total) by mouth daily. 07/01/20  Yes [name], [name], DO  sulfamethoxazole-trimethoprim (BACTRIM DS) 800-160 MG tablet Take 1 tablet by mouth 2 (two) times daily. Patient taking differently: Take 1 tablet by mouth 2 (two) times daily. Start date : 01/06/21 01/06/21  Yes Marrian Salvage, FNP  tamsulosin (FLOMAX) 0.4 MG CAPS capsule Take 1 capsule (0.4 mg total) by mouth daily. 09/14/20  Yes [name], [name], DO  traZODone (DESYREL) 50 MG tablet TAKE 1 TABLET BY MOUTH EVERYDAY AT BEDTIME Patient  taking differently: Take 50 mg by mouth at bedtime. 01/09/21  Yes Cameron Sprang, MD  vitamin E 1000 UNIT capsule Take 1,000 Units by mouth daily.   Yes [provider]     Objective    Physical Exam: Vitals:   01/10/21 1400 01/10/21 1533 01/10/21 1545 01/10/21 1659  BP: 128/60 (!) 149/80 120/77 (!) 148/89  Pulse: (!) 103 99 98 86  Resp: (!) _0 Temp:      SpO2: 96% 98% 99% 100%    General: appears to be stated age; alert; not oriented to person, place, time Skin: warm, dry, no rash Head:  AT/[address] Mouth:  Oral mucosa membranes appear moist, normal dentition Neck: supple; trachea midline Heart:  RRR; did not appreciate any M/R/G Lungs: CTAB, did not appreciate any wheezes, rales, or  rhonchi Abdomen: + BS; soft, ND, NT Vascular: 2+ pedal pulses b/l; 2+ radial pulses b/l Extremities: no peripheral edema, no muscle wasting Neuro: In the setting of the patient's current mental status and associated inability to follow instructions, unable to perform full neurologic exam at this time.  As such, assessment of strength, sensation, and cranial nerves is limited at this time. Patient noted to spontaneously move all 4 extremities. No tremors.      Labs on Admission: I have personally reviewed following labs and imaging studies  CBC: Recent Labs  Lab 01/10/21 1254  WBC 5.0  NEUTROABS 4.1  HGB 13.4  HCT 39.0  MCV 102.6*  PLT 416*   Basic Metabolic Panel: Recent Labs  Lab 01/10/21 1254  NA 138  K 4.2  CL 100  CO2 27  GLUCOSE 92  BUN 8  CREATININE 1.00  CALCIUM 9.1   GFR: Estimated Creatinine Clearance: 79.7 mL/min (by C-G formula based on SCr of 1 mg/dL). Liver Function Tests: Recent Labs  Lab 01/10/21 1254  AST 23  ALT 10  ALKPHOS 32*  BILITOT 1.4*  PROT 5.9*  ALBUMIN 3.5   No results for input(s): LIPASE, AMYLASE in the last 168 hours. No results for input(s): AMMONIA in the last 168 hours. Coagulation Profile: No results for input(s):  INR, PROTIME in the last 168 hours. Cardiac Enzymes: No results for input(s): CKTOTAL, CKMB, CKMBINDEX, TROPONINI in the last 168 hours. BNP (last 3 results) Recent Labs    12/07/20 1223  PROBNP 87.0   HbA1C: No results for input(s): HGBA1C in the last 72 hours. CBG: No results for input(s): GLUCAP in the last 168 hours. Lipid Profile: No results for input(s): CHOL, HDL, LDLCALC, TRIG, CHOLHDL, LDLDIRECT in the last 72 hours. Thyroid Function Tests: No results for input(s): TSH, T4TOTAL, FREET4, T3FREE, THYROIDAB in the last 72 hours. Anemia Panel: No results for input(s): VITAMINB12, FOLATE, FERRITIN, TIBC, IRON, RETICCTPCT in the last 72 hours. Urine analysis:    Component Value Date/Time   COLORURINE YELLOW 01/10/2021 1319   APPEARANCEUR CLEAR 01/10/2021 1319   LABSPEC 1.004 (L) 01/10/2021 1319   PHURINE 9.0 (H) 01/10/2021 1319   GLUCOSEU NEGATIVE 01/10/2021 1319   HGBUR NEGATIVE 01/10/2021 1319   BILIRUBINUR NEGATIVE 01/10/2021 1319   BILIRUBINUR negative 01/06/2021 1407   BILIRUBINUR negative 01/03/2018 0959   KETONESUR 5 (A) 01/10/2021 1319   PROTEINUR NEGATIVE 01/10/2021 1319   UROBILINOGEN 4.0 (A) 01/06/2021 1407   NITRITE NEGATIVE 01/10/2021 1319   LEUKOCYTESUR NEGATIVE 01/10/2021 1319    Radiological Exams on Admission: CT Head Wo Contrast  Result Date: 01/10/2021 CLINICAL DATA:  Altered mental status. Weakness. History of dementia. EXAM: CT HEAD WITHOUT CONTRAST TECHNIQUE: Contiguous axial images were obtained from the base of the skull through the vertex without intravenous contrast. COMPARISON:  CT head 12/13/2020 FINDINGS: Brain: The brainstem, cerebellum, cerebral peduncles, thalami, basal ganglia, basilar cisterns, and ventricular system appear within normal limits. Stable cerebral atrophy advanced for age. Periventricular white matter and corona radiata hypodensities favor chronic ischemic microvascular white matter disease. No intracranial hemorrhage, mass  lesion, or acute CVA. Vascular: There is atherosclerotic calcification of the cavernous carotid arteries bilaterally. Skull: Unremarkable Sinuses/Orbits: Unremarkable Other: No supplemental non-categorized findings. IMPRESSION: 1. No acute findings. 2. Stable cerebral atrophy and evidence of chronic ischemic microvascular white matter disease. 3. Atherosclerosis. Electronically Signed   By: Van Clines M.D.   On: 01/10/2021 14:48   CT Angio Chest PE W and/or Wo Contrast  Result Date:  01/10/2021 CLINICAL DATA:  Abdominal distension.  Lethargy.  Hematuria.  Cough. EXAM: CT ANGIOGRAPHY CHEST CT ABDOMEN AND PELVIS WITH CONTRAST TECHNIQUE: Multidetector CT imaging of the chest was performed using the standard protocol during bolus administration of intravenous contrast. Multiplanar CT image reconstructions and MIPs were obtained to evaluate the vascular anatomy. Multidetector CT imaging of the abdomen and pelvis was performed using the standard protocol during bolus administration of intravenous contrast. CONTRAST:  177m OMNIPAQUE IOHEXOL 350 MG/ML SOLN COMPARISON:  Multiple exams, including chest radiograph 01/10/2021 and CT abdomen 02/17/2019 FINDINGS: Despite efforts by the technologist and patient, motion artifact is present on today's exam and could not be eliminated. Arm positioning by the patient's size also lower signal to noise ratio. These factors reduces exam sensitivity and specificity. CTA CHEST FINDINGS Cardiovascular: No filling defect is identified in the pulmonary arterial tree to suggest pulmonary embolus. Coronary, aortic arch, and branch vessel atherosclerotic vascular disease. Moderate cardiomegaly. Calcifications of the aortic and mitral valve noted. Mediastinum/Nodes: Mild prominence of mediastinal adipose tissue. No pathologic adenopathy identified. Lungs/Pleura: Patchy ground-glass density/mosaic attenuation in the lungs potentially from edema, extrinsic allergic alveolitis, atypical  pneumonia, or bronchiolitis obliterans. No overt consolidation. Musculoskeletal: Right distal clavicular resection in the past. 1.6 by 1.0 cm sclerotic lesion of the left ninth rib, not significantly changed from 02/17/2019. Thoracic spondylosis. Review of the MIP images confirms the above findings. CT ABDOMEN and PELVIS FINDINGS Hepatobiliary: Unremarkable Pancreas: Unremarkable Spleen: Unremarkable Adrenals/Urinary Tract: Nonspecific 0.9 cm hypodense lesion of the right mid kidney posteriorly on image 32 series 9. Not appreciably changed from 2020. Nonspecific 5 mm lesion of the left kidney lower pole on image 39 of series 9, slightly more conspicuous than on 02/17/2019, probably benign but technically nonspecific. Adrenal glands in urinary bladder unremarkable. Stomach/Bowel: Several oblong densities in the colon may represent high-density pill fragments. Prominent stool throughout the colon favors constipation. No dilated small bowel. Normal appendix. Vascular/Lymphatic: Aortoiliac atherosclerotic vascular disease. Reproductive: Unremarkable Other: No supplemental non-categorized findings. Musculoskeletal: Grade 1 degenerative anterolisthesis of L4 on L5. Lumbar spondylosis and degenerative disc disease causing multilevel impingement. Review of the MIP images confirms the above findings. IMPRESSION: 1. No filling defect is identified in the pulmonary arterial tree to suggest pulmonary embolus. 2. Prominent stool throughout the colon favors constipation. 3. Cardiomegaly with hazy patchy ground-glass density/mosaic attenuation in the lung, possibilities including mild pulmonary edema, extrinsic allergic alveolitis, atypical pneumonia, or bronchiolitis obliterans. 4. Other imaging findings of potential clinical significance: Aortic Atherosclerosis (ICD10-I70.0). Coronary atherosclerosis. Mitral and aortic valve calcifications. Chronically stable left ninth rib sclerotic lesion, likely benign. Multilevel lumbar  impingement. Electronically Signed   By: WVan ClinesM.D.   On: 01/10/2021 18:27   CT ABDOMEN PELVIS W CONTRAST  Result Date: 01/10/2021 CLINICAL DATA:  Abdominal distension.  Lethargy.  Hematuria.  Cough. EXAM: CT ANGIOGRAPHY CHEST CT ABDOMEN AND PELVIS WITH CONTRAST TECHNIQUE: Multidetector CT imaging of the chest was performed using the standard protocol during bolus administration of intravenous contrast. Multiplanar CT image reconstructions and MIPs were obtained to evaluate the vascular anatomy. Multidetector CT imaging of the abdomen and pelvis was performed using the standard protocol during bolus administration of intravenous contrast. CONTRAST:  1087mOMNIPAQUE IOHEXOL 350 MG/ML SOLN COMPARISON:  Multiple exams, including chest radiograph 01/10/2021 and CT abdomen 02/17/2019 FINDINGS: Despite efforts by the technologist and patient, motion artifact is present on today's exam and could not be eliminated. Arm positioning by the patient's size also lower signal to noise ratio. These  factors reduces exam sensitivity and specificity. CTA CHEST FINDINGS Cardiovascular: No filling defect is identified in the pulmonary arterial tree to suggest pulmonary embolus. Coronary, aortic arch, and branch vessel atherosclerotic vascular disease. Moderate cardiomegaly. Calcifications of the aortic and mitral valve noted. Mediastinum/Nodes: Mild prominence of mediastinal adipose tissue. No pathologic adenopathy identified. Lungs/Pleura: Patchy ground-glass density/mosaic attenuation in the lungs potentially from edema, extrinsic allergic alveolitis, atypical pneumonia, or bronchiolitis obliterans. No overt consolidation. Musculoskeletal: Right distal clavicular resection in the past. 1.6 by 1.0 cm sclerotic lesion of the left ninth rib, not significantly changed from 02/17/2019. Thoracic spondylosis. Review of the MIP images confirms the above findings. CT ABDOMEN and PELVIS FINDINGS Hepatobiliary: Unremarkable  Pancreas: Unremarkable Spleen: Unremarkable Adrenals/Urinary Tract: Nonspecific 0.9 cm hypodense lesion of the right mid kidney posteriorly on image 32 series 9. Not appreciably changed from 2020. Nonspecific 5 mm lesion of the left kidney lower pole on image 39 of series 9, slightly more conspicuous than on 02/17/2019, probably benign but technically nonspecific. Adrenal glands in urinary bladder unremarkable. Stomach/Bowel: Several oblong densities in the colon may represent high-density pill fragments. Prominent stool throughout the colon favors constipation. No dilated small bowel. Normal appendix. Vascular/Lymphatic: Aortoiliac atherosclerotic vascular disease. Reproductive: Unremarkable Other: No supplemental non-categorized findings. Musculoskeletal: Grade 1 degenerative anterolisthesis of L4 on L5. Lumbar spondylosis and degenerative disc disease causing multilevel impingement. Review of the MIP images confirms the above findings. IMPRESSION: 1. No filling defect is identified in the pulmonary arterial tree to suggest pulmonary embolus. 2. Prominent stool throughout the colon favors constipation. 3. Cardiomegaly with hazy patchy ground-glass density/mosaic attenuation in the lung, possibilities including mild pulmonary edema, extrinsic allergic alveolitis, atypical pneumonia, or bronchiolitis obliterans. 4. Other imaging findings of potential clinical significance: Aortic Atherosclerosis (ICD10-I70.0). Coronary atherosclerosis. Mitral and aortic valve calcifications. Chronically stable left ninth rib sclerotic lesion, likely benign. Multilevel lumbar impingement. Electronically Signed   By: Van Clines M.D.   On: 01/10/2021 18:27   DG Chest Port 1 View  Result Date: 01/10/2021 CLINICAL DATA:  Generalized weakness, possible pneumonia EXAM: PORTABLE CHEST 1 VIEW COMPARISON:  07/30/2020 FINDINGS: Low lung volumes. Increased interstitial prominence. No significant pleural effusion. No pneumothorax.  Similar cardiomediastinal contours with cardiomegaly. IMPRESSION: Low lung volumes with increased interstitial prominence. Could reflect edema (cardiomegaly is present) or atypical/viral pneumonia in the appropriate setting. Electronically Signed   By: Macy Mis M.D.   On: 01/10/2021 13:33     EKG: Independently reviewed, with result as described above.    Assessment/Plan   In the setting of the patient's current mental status and associated inability to follow instructions, unable to perform full neurologic exam at this time.  As such, assessment of strength, sensation, and cranial nerves is limited at this time. Patient noted to spontaneously move all 4 extremities. No tremors.     Principal Problem:   Acute metabolic encephalopathy Active Problems:   Chronic anticoagulation   Hypothyroidism   Benign prostatic hyperplasia   CAP (community acquired pneumonia)   AF (paroxysmal atrial fibrillation) (HCC)    #) Acute metabolic encephalopathy: Relative to reported baseline dementia with mild visual hallucinations, the patient reportedly has been experiencing more frequent and intense hallucinations over the last 3 to 4 days, with associated increased confusion relative to baseline mental status according to the patient's wife.  Suspect a metabolic contribution in the form of suspected underlying pneumonia, as further detailed below.  Additionally, there may be a pharmacologic contribution to the patient's acute encephalopathy, as he is  on several central acting medications at home, which, while unaltered in terms of outpatient dosing, may have been subject to diminished renal clearance in the setting of a 20% decline in the patient's renal function since February 2022.  Specifically creatinine on 10/12/2020 was noted to be 0.8, relative to presenting creatinine 1.0.  A 20% reduction in renal clearance of renally metabolized medications in the patient with baseline dementia and narrow  threshold before mental status decompensation, may be rendering a significant contribution to the patient's recent altered mental status, somnolence, and worsening visual hallucinations.    No other source of underlying infection identified at this time, including no evidence of urinary tract infection, while nasopharyngeal COVID-19/influenza PCR performed in the ED today were found to be negative.  No additional overt metabolic/electrolyte contributions at this time.  Patient noted to be moving all 4 extremities, rendering possibility of acute ischemic CVA to be less likely at this time.  Seizures are also felt to be less likely.  In the context of the patient's acute encephalopathy superimposed on baseline dementia, we will keep patient n.p.o. until his mental status improves sufficiently that he is able to participate in and pass a nursing bedside swallow screen.     Plan: NPO. Nursing bedside swallow evaluation screen has been ordered, with associated nursing instructions to contact MD once the patient has passed this so that diet can be adjusted and home oral medications resumed.  Close monitoring of ensuing renal function, with repeat BMP in the morning.  Repeat CMP and CBC in the morning.  Check VBG to evaluate for any contribution from hypercapnic encephalopathy.  Check TSH, particular in setting of a known history of acquired hypothyroidism.  Check MMA.  Check urinary drug screen, and total CPK level.  Check ionized calcium level.  Work-up and management of suspected presenting community-acquired pneumonia, as further described below     #) Community-acquired pneumonia: Diagnosis on the basis of 2 3 days of new onset productive cough associated with development of confusion and worsening of visual hallucinations, with chest x-ray showing evidence of interstitial prominence potentially representing atypical pneumonia, while CTA chest showed evidence of patchy groundglass attenuation potentially  representing atypical pneumonia versus edema versus bronchiolitis obliterans.  Clinically, acutely decompensated heart failure appears to be less likely at this time.  SIRS criteria not met for the patient to be considered septic at this time.  No evidence of hypotension.  We will started on Rocephin and azithromycin, which appears appropriate, particularly with continuation of azithromycin for atypical coverage.  Plan: Check blood cultures x2.  Continue Rocephin and azithromycin.  Repeat CBC with differential in the morning.  Check urine strep antigen.  Add on serum procalcitonin level.      #) Hyperlipidemia: On atorvastatin as an outpatient.  Plan: In the setting of current n.p.o. status, will hold home statin for now.      #) Acquired hypothyroidism: On Synthroid as an outpatient.  In the setting of suspected acute metabolic encephalopathy superimposed on dementia, will check TSH.  Plan: Continue home dose of Synthroid for now.  Check TSH.       #) Benign prostatic hyperplasia: On Flomax as an outpatient.  Plan: In the setting of current n.p.o. status, will hold home Flomax for now.  Monitor strict I's and O's and daily weights.  Repeat BMP in the morning.       #) Paroxysmal atrial fibrillation: Documented history of such. In the setting of a CHA2DS2-VASc score of 4,  there is an indication for the patient to be on chronic anticoagulation for thromboembolic prophylaxis. Consistent with this, the patient is chronically anticoagulated on Eliquis. Home AV nodal blocking regimen: None.  Most recent echocardiogram was performed in June 2020, with findings further described above. Presenting EKG rate controlled atrial fibrillation, without evidence of acute ischemic changes.   Plan: monitor strict I's & O's and daily weights. Repeat BMP and CBC in the morning. Check serum magnesium level.  Will resume home Eliquis once patient has passed nursing bedside swallow screen.        DVT prophylaxis: SCDs Code Status: DNR/DNI Disposition Plan: Per Rounding Team Consults called: none  Admission status: Inpatient; med telemetry     Of note, this patient was added by me to the following Admit List/Treatment Team: [email or web address].      PLEASE NOTE THAT DRAGON DICTATION SOFTWARE WAS USED IN THE CONSTRUCTION OF THIS NOTE.   [facility] Pager [phone] From [place]  Otherwise, please contact night-coverage  [email or web address] Password [facility]   01/10/2021, 7:23 PM

## 2021-01-10 NOTE — Progress Notes (Signed)
Patient continouosly getting out of the bed, pulling cords and lines cannot re direct notified on call provider for further evaluation.

## 2021-01-10 NOTE — Telephone Encounter (Signed)
FYI. Please see below. Pt currently admitted at [facility] Patient Name:[name] Gender: Male DOB: 1950-09-22 Age: 70 Y 9 M 3 D Return Phone Number: (Primary)  [phone] (Secondary) [phone] City/State/Zip Code: [place]  [address] Client [facility] - Client Client Site [place] - Day Physician [place], [place] Type Call Who Is Calling Patient / Member / Family / Caregiver Call Type Triage / Clinical Caller Name Jeffie Spivack Relationship To Patient Spouse Return Phone Number [phone] (Primary) Chief Complaint CONFUSION - new onset Reason for Call Symptomatic / Request for [employer] states husband is worsening, blood in urine, given antibiotic, uti negative, and is acting strangely/confusion. Its lasted for 2 days. Translation No Nurse Assessment Nurse: Thad Ranger, RN, Denise Date/Time (Eastern Time): 01/10/2021 10:14:10 AM Confirm and document reason for call. If symptomatic, describe symptoms. ---Caller states husband is worsening, blood in urine, given antibiotic, uti negative, and is acting strangely/ confusion. Its lasted for 2 days. Does the patient have any new or worsening symptoms? ---Yes Will a triage be completed? ---Yes Related visit to physician within the last 2 weeks? ---Yes Does the PT have any chronic conditions? (i.e. diabetes, asthma, this includes High risk factors for pregnancy, etc.) ---Yes List chronic conditions. ---Alzheimers Is this a behavioral health or substance abuse call? ---No Guidelines Guideline Title Affirmed Question Affirmed Notes Nurse Date/Time (Eastern Time) Confusion - Delirium [1] Difficult to awaken or acting confused (e.g., disoriented, slurred speech) AND [2] present now AND [3] new-onset Carmon, RN, Denise 01/10/2021 10:15:16 AM PLEASE NOTE: All  timestamps contained within this report are represented as [place] Standard Time. CONFIDENTIALTY NOTICE: This fax transmission is intended only for the addressee. It contains information that is legally privileged, confidential or otherwise protected from use or disclosure. If you are not the intended recipient, you are strictly prohibited from reviewing, disclosing, copying using or disseminating any of this information or taking any action in reliance on or regarding this information. If you have received this fax in error, please notify us immediately by telephone so that we can arrange for its return to [place]. Phone: [phone], Toll-Free: [phone], Fax: [phone] Page: 2 of 2 Call Id: [id number] [place]. Time Eilene Ghazi Time) Disposition Final User 01/10/2021 10:11:38 AM Send to Urgent Ezra Sites 01/10/2021 10:16:30 AM Send To RN Personal Thad Ranger, RN, Langley Gauss 01/10/2021 10:27:20 AM 911 Outcome Documentation Carmon, RN, Langley Gauss Reason: 911 cb complete w/vmm left on vmb w/o phi 01/10/2021 10:16:10 AM Call EMS 911 Now Yes Carmon, RN, Yevette Edwards Disagree/Comply Comply Caller Understands Yes PreDisposition Call Doctor Care Advice Given Per Guideline CALL EMS 911 NOW: CARE ADVICE given per Confusion-Delirium (Adult) guideline.

## 2021-01-10 NOTE — ED Provider Notes (Signed)
[facility] DEPT Provider Note   CSN: [id number] Arrival date & time: 01/10/21  1124     History No chief complaint on file.   [name]. is a 70 y.o. male.  The history is provided by the spouse. The history is limited by the condition of the patient.  Altered Mental Status Presenting symptoms: behavior changes   Presenting symptoms comment:  Hallucinations: thinking there is an ant [place] in the bedroom.  Was naked in the closet today examining each shoe in detail. Severity:  Severe Most recent episode:  Today Episode history:  Multiple Duration:  3 days Timing:  Constant Progression:  Worsening Chronicity:  New Context: dementia and recent infection (had a UTI and was treated)   Context: not head injury and not recent change in medication   Associated symptoms: bladder incontinence and decreased appetite   Associated symptoms: no abdominal pain, no fever, no palpitations, no rash, no seizures and no vomiting        Past Medical History:  Diagnosis Date  . Acute delirium 07/14/2020  . Adenomatous colon polyp 2016  . Allergy   . AMS (altered mental status) 07/13/2020  . Anxiety   . Bell palsy    resolved  . Bell's palsy 09/14/2020  . Chicken pox   . Concussion with no loss of consciousness 07/07/2018  . Depression   . Diet-controlled diabetes mellitus ([place])   . Eating disorder   . Essential hypertension 11/29/2017  . Gastro-esophageal reflux disease without esophagitis 09/14/2020  . GERD (gastroesophageal reflux disease)   . History of vitamin D deficiency   . Hyperlipidemia   . Hypertension   . Hypokalemia   . Memory loss   . MI (myocardial infarction) ([place])    per pt   . Neoplasm of uncertain behavior of skin 09/14/2020  . Personal history of colonic polyps 09/14/2020  . Ulcerative colitis ([place]) 2005-2006   severe     Patient Active Problem List   Diagnosis Date Noted  . Lower extremity edema 12/09/2020  . Hypotension  12/09/2020  . Type 2 diabetes mellitus without complication ([place]), diet controlled 09/15/2020  . Benign prostatic hyperplasia 09/15/2020  . 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 Hypercholesterolemia 09/14/2020  . Hypothyroidism   . Vitamin B 12 deficiency   . On statin therapy 05/26/2019  . Chronic anticoagulation 05/26/2019  . QT prolongation 02/19/2019  . Aortic atherosclerosis ([place]) 02/18/2019  . Carotid artery calcification 02/18/2019  . Cardiomegaly 02/18/2019  . Dementia ([place]) 02/17/2019  . Atrial fibrillation with rapid ventricular response ([place]) 02/17/2019  . Hypokalemia 02/17/2019  . GAD (generalized anxiety disorder) 05/14/2018  . Insomnia 11/29/2017  . Moderate obstructive sleep apnea 11/08/2017  . Class 3 severe obesity due to excess calories with body mass index (BMI) of 40.0 to 44.9 in adult ([place]) 11/08/2017  . Major depression, recurrent, chronic ([place]) 09/18/2017    Past Surgical History:  Procedure Laterality Date  . COLONOSCOPY WITH PROPOFOL N/A 07/15/2014   Procedure: COLONOSCOPY WITH PROPOFOL;  Surgeon: [name], MD;  Location: [facility];  Service: Endoscopy;  Laterality: N/A;  . COLONOSCOPY WITH PROPOFOL N/A 08/22/2015   Procedure: COLONOSCOPY WITH PROPOFOL;  Surgeon: [name], MD;  Location: [facility];  Service: Endoscopy;  Laterality: N/A;  . ORIF ANKLE FRACTURE  02/27/2012   Procedure: OPEN REDUCTION INTERNAL FIXATION (ORIF) ANKLE FRACTURE;  Surgeon: Colin Rhein, MD;  Location: [facility];  Service: Orthopedics;  Laterality: Left;  ORIF left bimalleolar ankle fracture  . SHOULDER SURGERY     LEFT  . SIGMOIDOSCOPY  10/08/2018   Externa and Internal Hmorrhoids. Tubular and Tublovillous adenoma removed from transverse colon. Inflammatory pseudopolyps, negative for dysplasia  . TONSILLECTOMY    . UPPER GASTROINTESTINAL ENDOSCOPY         Family History  Problem Relation Age of Onset  . Dementia Mother   .  Hypertension Mother   . Early death Father 67       drowning   . Post-traumatic stress disorder Brother   . Neuropathy Brother     Social History   Tobacco Use  . Smoking status: Former Smoker    Packs/day: 1.00    Years: 19.00    Pack years: 19.00    Quit date: 02/25/1982    Years since quitting: 38.9  . Smokeless tobacco: Former Systems developer    Quit date: Biochemist, clinical  . Vaping Use: Never used  Substance Use Topics  . Alcohol use: Yes    Comment: occ  . Drug use: No    Home Medications Prior to Admission medications   Medication Sig Start Date End Date Taking? Authorizing Provider  apixaban (ELIQUIS) 5 MG TABS tablet Take 1 tablet (5 mg total) by mouth 2 (two) times daily. 06/30/20 06/25/21  [name], [name], DO  atorvastatin (LIPITOR) 20 MG tablet Take 1 tablet (20 mg total) by mouth daily. 06/30/20   [name], [name], DO  azaTHIOprine (IMURAN) 50 MG tablet Take 200 mg by mouth every morning.    [provider]  Cholecalciferol (VITAMIN D3) 50 MCG (2000 UT) CAPS Take by mouth.    [provider]  divalproex (DEPAKOTE SPRINKLE) 125 MG capsule Take 6 capsules (750 mg total) by mouth every 12 (twelve) hours. 09/14/20   [name], [name], DO  DULoxetine (CYMBALTA) 60 MG capsule Take 1 capsule (60 mg total) by mouth daily. 09/14/20   [name], [name], DO  levothyroxine (SYNTHROID) 50 MCG tablet Take 1 tablet (50 mcg total) by mouth daily before breakfast. 12/08/20 03/08/21  [name], [name], DO  Melatonin 10 MG TABS Take by mouth.    [provider]  OLANZapine (ZYPREXA) 10 MG tablet 1/2 tab in the morning and 1.5 tabs QHS 12/19/20   [name], [name], DO  potassium chloride (KLOR-CON M10) 10 MEQ tablet Take 1 tablet (10 mEq total) by mouth daily. 07/01/20   [name], [name], DO  sulfamethoxazole-trimethoprim (BACTRIM DS) 800-160 MG tablet Take 1 tablet by mouth 2 (two) times daily. 01/06/21   Marrian Salvage, FNP  tamsulosin (FLOMAX) 0.4 MG CAPS capsule Take 1  capsule (0.4 mg total) by mouth daily. 09/14/20   [name], [name], DO  traZODone (DESYREL) 50 MG tablet TAKE 1 TABLET BY MOUTH EVERYDAY AT BEDTIME 01/09/21   Cameron Sprang, MD    Allergies    Losartan potassium-hctz, Nsaids, Zoloft [sertraline hcl], and Benzodiazepines  Review of Systems   Review of Systems  Constitutional: Positive for decreased appetite. Negative for chills and fever.  HENT: Negative for ear pain and sore throat.   Eyes: Negative for pain and visual disturbance.  Respiratory: Positive for cough. Negative for shortness of breath.   Cardiovascular: Negative for chest pain and palpitations.  Gastrointestinal: Negative for abdominal pain and vomiting.  Genitourinary: Positive for bladder incontinence and hematuria (3 days ago). Negative for dysuria.  Musculoskeletal: Negative for arthralgias and back pain.  Skin: Negative for color change and rash.  Neurological: Negative for seizures and syncope.       Trouble walking x 3 days  All other systems reviewed and are negative.   Physical Exam Updated Vital Signs BP (!) 144/83 (BP Location: Right Arm)   Pulse 97   Temp 98.6 F (37 C)   Resp 15   SpO2 93%   Physical Exam Vitals and nursing note reviewed.  Constitutional:      Appearance: He is well-developed.  HENT:     Head: Normocephalic and atraumatic.  Eyes:     Conjunctiva/sclera: Conjunctivae normal.  Cardiovascular:     Rate and Rhythm: Regular rhythm. Tachycardia present.     Heart sounds: No murmur heard.   Pulmonary:     Effort: Pulmonary effort is normal. No respiratory distress.     Breath sounds: Normal breath sounds.  Abdominal:     Palpations: Abdomen is soft.     Tenderness: There is no abdominal tenderness.  Musculoskeletal:     Cervical back: Neck supple.  Skin:    General: Skin is warm and dry.  Neurological:     Mental Status: He is alert. He is disoriented.     Comments: Moves all extremities. Able to pull himself to a seated  position     ED Results / Procedures / Treatments   Labs (all labs ordered are listed, but only abnormal results are displayed) Labs Reviewed  COMPREHENSIVE METABOLIC PANEL - Abnormal; Notable for the following components:      Result Value   Total Protein 5.9 (*)    Alkaline Phosphatase 32 (*)    Total Bilirubin 1.4 (*)    All other components within normal limits  CBC WITH DIFFERENTIAL/PLATELET - Abnormal; Notable for the following components:   RBC 3.80 (*)    MCV 102.6 (*)    MCH 35.3 (*)    RDW 17.5 (*)    Platelets 118 (*)    All other components within normal limits  URINALYSIS, ROUTINE W REFLEX MICROSCOPIC - Abnormal; Notable for the following components:   Specific Gravity, Urine 1.004 (*)    pH 9.0 (*)    Ketones, ur 5 (*)    All other components within normal limits  RESP PANEL BY RT-PCR (FLU A&B, COVID) ARPGX2  LACTIC ACID, PLASMA  BRAIN NATRIURETIC PEPTIDE  LACTIC ACID, PLASMA  TROPONIN I (HIGH SENSITIVITY)  TROPONIN I (HIGH SENSITIVITY)    EKG EKG Interpretation  Date/Time:  Tuesday Jan 10 2021 12:41:40 EDT Ventricular Rate:  116 PR Interval:    QRS Duration: 124 QT Interval:  368 QTC Calculation: 512 R Axis:   113 Text Interpretation: Atrial fibrillation Nonspecific intraventricular conduction delay Borderline T abnormalities, inferior leads no acute ischemia Confirmed by Lorre Munroe ([id number]) on 01/10/2021 3:08:19 PM   Radiology CT Head Wo Contrast  Result Date: 01/10/2021 CLINICAL DATA:  Altered mental status. Weakness. History of dementia. EXAM: CT HEAD WITHOUT CONTRAST TECHNIQUE: Contiguous axial images were obtained from the base of the skull through the vertex without intravenous contrast. COMPARISON:  CT head 12/13/2020 FINDINGS: Brain: The brainstem, cerebellum, cerebral peduncles, thalami, basal ganglia, basilar cisterns, and ventricular system appear within normal limits. Stable cerebral atrophy advanced for age. Periventricular white matter and  corona radiata hypodensities favor chronic ischemic microvascular white matter disease. No intracranial hemorrhage, mass lesion, or acute CVA. Vascular: There is atherosclerotic calcification of the cavernous carotid arteries bilaterally. Skull: Unremarkable Sinuses/Orbits: Unremarkable Other: No supplemental non-categorized findings. IMPRESSION: 1. No acute findings. 2. Stable cerebral atrophy  and evidence of chronic ischemic microvascular white matter disease. 3. Atherosclerosis. Electronically Signed   By: Van Clines M.D.   On: 01/10/2021 14:48   DG Chest Port 1 View  Result Date: 01/10/2021 CLINICAL DATA:  Generalized weakness, possible pneumonia EXAM: PORTABLE CHEST 1 VIEW COMPARISON:  07/30/2020 FINDINGS: Low lung volumes. Increased interstitial prominence. No significant pleural effusion. No pneumothorax. Similar cardiomediastinal contours with cardiomegaly. IMPRESSION: Low lung volumes with increased interstitial prominence. Could reflect edema (cardiomegaly is present) or atypical/viral pneumonia in the appropriate setting. Electronically Signed   By: Macy Mis M.D.   On: 01/10/2021 13:33    Procedures Procedures   Medications Ordered in ED Medications - No data to display  ED Course  I have reviewed the triage vital signs and the nursing notes.  Pertinent labs & imaging results that were available during my care of the patient were reviewed by me and considered in my medical decision making (see chart for details).    MDM Rules/Calculators/A&P                          Marylou Flesher [name]. has a history of dementia but has worsened over the past 3 days and has become increasingly confused with some visual hallucinations.  His behavior has changed, and he has been eating and drinking less.  He was brought in for evaluation.  He was noted to be mildly tachycardic without any other obvious abnormalities on physical exam.  He was evaluated for evidence of emergent neurologic,  pulmonary, cardiac, and/or intra-abdominal pathology.  Currently CT scans are pending, and care was transferred to Dr. Darl Householder. Final Clinical Impression(s) / ED Diagnoses Final diagnoses:  Delirium  Chronic diastolic CHF (congestive heart failure) ([place])    Rx / DC Orders ED Discharge Orders    None       Arnaldo Natal, MD 01/10/21 [phone]

## 2021-01-10 NOTE — ED Provider Notes (Signed)
  Physical Exam  BP (!) 148/89 (BP Location: Left Arm)   Pulse 86   Temp 98.6 F (37 C)   Resp 16   SpO2 100%   Physical Exam  ED Course/Procedures     Procedures  MDM  Care assumed at 3 PM.  Patient is here with worsening hallucinations and altered mental status.  Patient recently was put on Bactrim and mental status has gotten worse.  He now is seeing ants crawling on a [place] and has been in the closet today picking up things.  He is not eating or drinking.  He also has productive cough as well.  Chest x-ray showed possible atypical pneumonia.  Signout pending CTA chest and likely admission  6:38 PM CT showed constipation and some cardiomegaly and atypical pneumonia.  Patient's BNP is normal.  Patient does have 1+ pitting edema bilaterally.  I ordered IV Rocephin and azithromycin to cover pneumonia.  COVID test is negative.  I wonder if the worsening mental status is from Bactrim or worsening dementia or atypical pneumonia.  At this point will admit for atypical pneumonia.  Wife is interested in memory care unit as well     Drenda Freeze, MD 01/10/21 [phone]

## 2021-01-10 NOTE — Progress Notes (Signed)
Central Telemetry called for pt's HR 144 on A fib with RVR, EKG done and paged on call provider. Awaiting orders.

## 2021-01-10 NOTE — ED Notes (Signed)
All triage question answer by the patient wife

## 2021-01-10 NOTE — Telephone Encounter (Signed)
Patient was not seen by this provider for his UTI condition.   Of note, he is in the emergency room currently.

## 2021-01-10 NOTE — ED Notes (Signed)
Primo Fit and new brief placed on patient.

## 2021-01-10 NOTE — ED Notes (Signed)
Patients family member called out due to the patient sliding off the bed. With assistance the patient was pulled up as well as mittens placed on patients hands due to patient pulling off cords and lines.

## 2021-01-11 ENCOUNTER — Encounter (HOSPITAL_COMMUNITY): Payer: Self-pay | Admitting: Internal Medicine

## 2021-01-11 DIAGNOSIS — G9341 Metabolic encephalopathy: Secondary | ICD-10-CM | POA: Diagnosis not present

## 2021-01-11 DIAGNOSIS — I480 Paroxysmal atrial fibrillation: Secondary | ICD-10-CM | POA: Diagnosis present

## 2021-01-11 DIAGNOSIS — J189 Pneumonia, unspecified organism: Secondary | ICD-10-CM | POA: Diagnosis present

## 2021-01-11 LAB — BLOOD GAS, VENOUS
Acid-Base Excess: 4.6 mmol/L — ABNORMAL HIGH (ref 0.0–2.0)
Bicarbonate: 29.3 mmol/L — ABNORMAL HIGH (ref 20.0–28.0)
O2 Saturation: 57.7 %
Patient temperature: 98.6
pCO2, Ven: 45.6 mmHg (ref 44.0–60.0)
pH, Ven: 7.423 (ref 7.250–7.430)
pO2, Ven: 32.6 mmHg (ref 32.0–45.0)

## 2021-01-11 LAB — CBC WITH DIFFERENTIAL/PLATELET
Abs Immature Granulocytes: 0.05 10*3/uL (ref 0.00–0.07)
Basophils Absolute: 0 10*3/uL (ref 0.0–0.1)
Basophils Relative: 1 %
Eosinophils Absolute: 0.1 10*3/uL (ref 0.0–0.5)
Eosinophils Relative: 1 %
HCT: 40.3 % (ref 39.0–52.0)
Hemoglobin: 14 g/dL (ref 13.0–17.0)
Immature Granulocytes: 1 %
Lymphocytes Relative: 11 %
Lymphs Abs: 0.6 10*3/uL — ABNORMAL LOW (ref 0.7–4.0)
MCH: 36 pg — ABNORMAL HIGH (ref 26.0–34.0)
MCHC: 34.7 g/dL (ref 30.0–36.0)
MCV: 103.6 fL — ABNORMAL HIGH (ref 80.0–100.0)
Monocytes Absolute: 0.4 10*3/uL (ref 0.1–1.0)
Monocytes Relative: 9 %
Neutro Abs: 3.9 10*3/uL (ref 1.7–7.7)
Neutrophils Relative %: 77 %
Platelets: 126 10*3/uL — ABNORMAL LOW (ref 150–400)
RBC: 3.89 MIL/uL — ABNORMAL LOW (ref 4.22–5.81)
RDW: 17.7 % — ABNORMAL HIGH (ref 11.5–15.5)
WBC: 5.1 10*3/uL (ref 4.0–10.5)
nRBC: 0 % (ref 0.0–0.2)

## 2021-01-11 LAB — COMPREHENSIVE METABOLIC PANEL
ALT: 11 U/L (ref 0–44)
AST: 28 U/L (ref 15–41)
Albumin: 3.5 g/dL (ref 3.5–5.0)
Alkaline Phosphatase: 32 U/L — ABNORMAL LOW (ref 38–126)
Anion gap: 11 (ref 5–15)
BUN: 9 mg/dL (ref 8–23)
CO2: 23 mmol/L (ref 22–32)
Calcium: 9.1 mg/dL (ref 8.9–10.3)
Chloride: 102 mmol/L (ref 98–111)
Creatinine, Ser: 1.06 mg/dL (ref 0.61–1.24)
GFR, Estimated: >60 mL/min (ref >60)
Glucose, Bld: 88 mg/dL (ref 70–99)
Potassium: 3.9 mmol/L (ref 3.5–5.1)
Sodium: 136 mmol/L (ref 135–145)
Total Bilirubin: 1.3 mg/dL — ABNORMAL HIGH (ref 0.3–1.2)
Total Protein: 6.1 g/dL — ABNORMAL LOW (ref 6.5–8.1)

## 2021-01-11 LAB — PHOSPHORUS: Phosphorus: 3.6 mg/dL (ref 2.5–4.6)

## 2021-01-11 LAB — CK: Total CK: 310 U/L (ref 49–397)

## 2021-01-11 LAB — MAGNESIUM: Magnesium: 1.8 mg/dL (ref 1.7–2.4)

## 2021-01-11 LAB — TSH: TSH: 2.482 u[IU]/mL (ref 0.350–4.500)

## 2021-01-11 LAB — PROCALCITONIN: Procalcitonin: <0.1 ng/mL

## 2021-01-11 MED ORDER — CETIRIZINE HCL 10 MG PO TABS
10.0000 mg | ORAL_TABLET | Freq: Every evening | ORAL | Status: DC
  Administered 2021-01-11: 10 mg via ORAL
  Filled 2021-01-11 (×2): qty 1

## 2021-01-11 MED ORDER — AZATHIOPRINE 50 MG PO TABS: 200.0000 mg | ORAL_TABLET | ORAL | Status: DC

## 2021-01-11 MED ORDER — APIXABAN 5 MG PO TABS
5.0000 mg | ORAL_TABLET | Freq: Two times a day (BID) | ORAL | Status: DC
  Administered 2021-01-11 – 2021-01-18 (×15): 5 mg via ORAL
  Filled 2021-01-11 (×15): qty 1

## 2021-01-11 MED ORDER — DIVALPROEX SODIUM 125 MG PO CSDR
750.0000 mg | DELAYED_RELEASE_CAPSULE | Freq: Two times a day (BID) | ORAL | Status: DC
  Administered 2021-01-11 – 2021-01-18 (×15): 750 mg via ORAL
  Filled 2021-01-11 (×16): qty 6

## 2021-01-11 MED ORDER — POLYETHYLENE GLYCOL 3350 17 G PO PACK
17.0000 g | PACK | Freq: Every day | ORAL | Status: DC
  Administered 2021-01-11 – 2021-01-13 (×3): 17 g via ORAL
  Filled 2021-01-11 (×3): qty 1

## 2021-01-11 MED ORDER — SENNOSIDES-DOCUSATE SODIUM 8.6-50 MG PO TABS
1.0000 | ORAL_TABLET | Freq: Two times a day (BID) | ORAL | Status: DC
  Administered 2021-01-11 – 2021-01-18 (×14): 1 via ORAL
  Filled 2021-01-11 (×14): qty 1

## 2021-01-11 MED ORDER — OLANZAPINE 5 MG PO TABS: 5.0000 mg | ORAL_TABLET | ORAL | Status: DC

## 2021-01-11 MED ORDER — MELATONIN 5 MG PO TABS
10.0000 mg | ORAL_TABLET | Freq: Every day | ORAL | Status: DC
  Administered 2021-01-11 – 2021-01-14 (×4): 10 mg via ORAL
  Filled 2021-01-11 (×6): qty 2

## 2021-01-11 MED ORDER — LEVOTHYROXINE SODIUM 50 MCG PO TABS
50.0000 ug | ORAL_TABLET | Freq: Every day | ORAL | Status: DC
  Administered 2021-01-12 – 2021-01-17 (×6): 50 ug via ORAL
  Filled 2021-01-11 (×6): qty 1

## 2021-01-11 MED ORDER — OLANZAPINE 5 MG PO TABS
5.0000 mg | ORAL_TABLET | Freq: Every day | ORAL | Status: DC
  Administered 2021-01-11 – 2021-01-18 (×8): 5 mg via ORAL
  Filled 2021-01-11 (×8): qty 1

## 2021-01-11 MED ORDER — DULOXETINE HCL 60 MG PO CPEP
60.0000 mg | ORAL_CAPSULE | Freq: Every day | ORAL | Status: DC
  Administered 2021-01-11 – 2021-01-18 (×8): 60 mg via ORAL
  Filled 2021-01-11 (×9): qty 1

## 2021-01-11 MED ORDER — FUROSEMIDE 10 MG/ML IJ SOLN
20.0000 mg | Freq: Once | INTRAMUSCULAR | Status: AC
  Administered 2021-01-11: 20 mg via INTRAVENOUS
  Filled 2021-01-11: qty 2

## 2021-01-11 MED ORDER — OLANZAPINE 5 MG PO TABS
15.0000 mg | ORAL_TABLET | Freq: Every day | ORAL | Status: DC
  Administered 2021-01-11 – 2021-01-17 (×7): 15 mg via ORAL
  Filled 2021-01-11 (×8): qty 3

## 2021-01-11 MED ORDER — TRAZODONE HCL 50 MG PO TABS
50.0000 mg | ORAL_TABLET | Freq: Every day | ORAL | Status: DC
  Administered 2021-01-11: 50 mg via ORAL
  Filled 2021-01-11: qty 1

## 2021-01-11 MED ORDER — METOPROLOL TARTRATE 5 MG/5ML IV SOLN
5.0000 mg | Freq: Once | INTRAVENOUS | Status: AC
  Administered 2021-01-11: 5 mg via INTRAVENOUS
  Filled 2021-01-11: qty 5

## 2021-01-11 MED ORDER — AZATHIOPRINE 50 MG PO TABS
200.0000 mg | ORAL_TABLET | ORAL | Status: DC
  Administered 2021-01-12 – 2021-01-18 (×7): 200 mg via ORAL
  Filled 2021-01-11 (×7): qty 4

## 2021-01-11 MED ORDER — ATORVASTATIN CALCIUM 20 MG PO TABS
20.0000 mg | ORAL_TABLET | Freq: Every day | ORAL | Status: DC
  Administered 2021-01-11 – 2021-01-18 (×8): 20 mg via ORAL
  Filled 2021-01-11 (×10): qty 1

## 2021-01-11 MED ORDER — TAMSULOSIN HCL 0.4 MG PO CAPS
0.4000 mg | ORAL_CAPSULE | Freq: Every day | ORAL | Status: DC
  Administered 2021-01-12 – 2021-01-18 (×7): 0.4 mg via ORAL
  Filled 2021-01-11 (×8): qty 1

## 2021-01-11 NOTE — Progress Notes (Signed)
[facility] Progress Note  Patient: [name]    WUJ:[id number]  DOA: 01/10/2021     Date of Service: the patient was seen and examined on 01/11/2021  Brief hospital course: Past medical history of paroxysmal A. fib, dementia, depression, anxiety, hypothyroidism. Brought to ER due to worsening bilateral edema, poor p.o. intake and worsening hallucination. Thought to be having community-acquired pneumonia and was started on IV antibiotics. Currently plan is continue to control agitation and hallucination.  Assessment and Plan: 1.  Worsening visual hallucination. Dementia with acute delirium, behavioral disturbances Depression I do not think the patient has acute metabolic encephalopathy. Patient appears to have worsening of his chronic dementia. Recent UTI and medication change with Bactrim and trazodone which may have contributed some. Currently we will resume his home medication which includes Depakote 750 mg every 12 hours, Cymbalta 60 mg daily, Zyprexa 5 mg in the morning and 15 mg nightly and trazodone 50 mg nightly. Check Depakote level.  LFTs yesterday were fine.  Landscape architect.  2.  Concern for community-acquired pneumonia-ruled out. Recent UTI-treated with Bactrim Does not appear to be hypoxic. CT chest shows possibility of atypical pneumonia although procalcitonin level is negative. Currently we will discontinue the biotic and monitor.  3.  Bilateral pedal edema Etiology not clear.  Likely acute on chronic diastolic CHF in the setting of A. fib. Chest x-ray shows possible vascular congestion. Echocardiogram 2020 EF 65%. Will provide 1 dose of IV Lasix and monitor response.  4.  BPH Continue Flomax.  5.  Paroxysmal A. fib HTN Currently rate controlled. Continue anticoagulation. Not on any medication for rate control.  Monitor.  6.  Ulcerative colitis On chronic Imuran.  Continue.  7.  B12 deficiency. Continue supplementation.  8.   Constipation. Likely contributing to confusion. Continue bowel regimen.  9. Hypothyroidism Continue Synthroid.  Diet: Cardiac diet DVT Prophylaxis:   SCDs Start: 01/10/21 1921 apixaban (ELIQUIS) tablet 5 mg    Advance goals of care discussion: DNR  Family Communication: family was present at bedside, at the time of interview.  The pt provided permission to discuss medical plan with the family. Opportunity was given to ask question and all questions were answered satisfactorily.   Disposition:  Status is: Inpatient  Remains inpatient appropriate because:Altered mental status   Dispo: The patient is from: Home              Anticipated d/c is to: Memory care unit versus home              Patient currently is not medically stable to d/c.   Difficult to place patient Yes  Subjective: No nausea no vomiting.  No fever no chills.  Confused.  Unable to answer questions appropriately.  Per wife he is seeing cows, cockroaches in the room.  Physical Exam:  General: Appear in moderate distress, no Rash; Oral Mucosa Clear, dry. no Abnormal Neck Mass Or lumps, Conjunctiva normal  Cardiovascular: S1 and S2 Present, no Murmur, Respiratory: good respiratory effort, Bilateral Air entry present and CTA, no Crackles, no wheezes Abdomen: Bowel Sound present, Soft and no tenderness Extremities: bilateral  Pedal edema Neurology: alert and not oriented to time, place, and person affect emotionally labile. no new focal deficit Gait not checked due to patient safety concerns  Vitals:   01/11/21 0009 01/11/21 0345 01/11/21 0956 01/11/21 1357  BP: 118/63 (!) 137/95 (!) 150/104 (!) 127/93  Pulse: (!) 104 (!) 101 77 65  Resp: 18 18 19  20  Temp: 98.2 F (36.8 C) 98.6 F (37 C) 98 F (36.7 C) 98 F (36.7 C)  TempSrc: Oral Oral Oral Axillary  SpO2: 96% 97% 95% 95%    Intake/Output Summary (Last 24 hours) at 01/11/2021 1905 Last data filed at 01/11/2021 1900 Gross per 24 hour  Intake 240 ml   Output --  Net 240 ml   There were no vitals filed for this visit.  Data Reviewed: I have personally reviewed and interpreted daily labs, tele strips, imaging. I reviewed all nursing notes, pharmacy notes, vitals, pertinent old records I have discussed plan of care as described above with RN and patient/family.  CBC: Recent Labs  Lab 01/10/21 1254 01/11/21 0113  WBC 5.0 5.1  NEUTROABS 4.1 3.9  HGB 13.4 14.0  HCT 39.0 40.3  MCV 102.6* 103.6*  PLT 118* 932*   Basic Metabolic Panel: Recent Labs  Lab 01/10/21 1254 01/10/21 2220 01/11/21 0113  NA 138  --  136  K 4.2  --  3.9  CL 100  --  102  CO2 27  --  23  GLUCOSE 92  --  88  BUN 8  --  9  CREATININE 1.00  --  1.06  CALCIUM 9.1  --  9.1  MG  --  1.9 1.8  PHOS  --   --  3.6    Studies: No results found.  Scheduled Meds: . apixaban  5 mg Oral BID  . atorvastatin  20 mg Oral Daily  . [START ON 01/12/2021] azaTHIOprine  200 mg Oral BH-q7a  . cetirizine  10 mg Oral QPM  . divalproex  750 mg Oral Q12H  . DULoxetine  60 mg Oral Daily  . furosemide  20 mg Intravenous Once  . levothyroxine  50 mcg Oral QAC breakfast  . melatonin  10 mg Oral Daily  . OLANZapine  5 mg Oral Daily   And  . OLANZapine  15 mg Oral QHS  . polyethylene glycol  17 g Oral Daily  . senna-docusate  1 tablet Oral BID  . tamsulosin  0.4 mg Oral Daily  . traZODone  50 mg Oral QHS   Continuous Infusions: PRN Meds: acetaminophen **OR** acetaminophen, haloperidol lactate, metoprolol tartrate  Time spent: 35 minutes  Author: Berle Mull, MD [facility] 01/11/2021 7:05 PM  To reach On-call, see care teams to locate the attending and reach out via [email or web address]. Between 7PM-7AM, please contact night-coverage If you still have difficulty reaching the attending provider, please page the [facility] (Director on Call) for [facility] on amion for assistance.

## 2021-01-11 NOTE — TOC Progression Note (Signed)
Transition of Care (TOC) - Progression Note    Patient Details  Name: [name]. MRN: [id number] Date of Birth: [birth year]  Transition of Care [facility]) CM/SW Contact  Purcell Mouton, RN Phone Number: 01/11/2021, 10:43 AM  Clinical Narrative:    Pt from home with his wife. TOC will continue to follow for discharge needs.    Expected Discharge Plan: Home/Self Care Barriers to Discharge: No Barriers Identified  Expected Discharge Plan and Services Expected Discharge Plan: Home/Self Care       Living arrangements for the past 2 months: Single Family Home                                       Social Determinants of Health (SDOH) Interventions    Readmission Risk Interventions Readmission Risk Prevention Plan 02/18/2019  Transportation Screening Complete  PCP or Specialist Appt within 3-5 Days Complete  HRI or [place] Complete  Social Work Consult for [place] Planning/Counseling Complete  Palliative Care Screening Not Applicable  Medication Review Press photographer) Complete  Some recent data might be hidden

## 2021-01-12 DIAGNOSIS — G9341 Metabolic encephalopathy: Secondary | ICD-10-CM | POA: Diagnosis not present

## 2021-01-12 LAB — VALPROIC ACID LEVEL: Valproic Acid Lvl: 84 ug/mL (ref 50.0–100.0)

## 2021-01-12 LAB — BASIC METABOLIC PANEL
Anion gap: 9 (ref 5–15)
BUN: 14 mg/dL (ref 8–23)
CO2: 28 mmol/L (ref 22–32)
Calcium: 9 mg/dL (ref 8.9–10.3)
Chloride: 98 mmol/L (ref 98–111)
Creatinine, Ser: 1.12 mg/dL (ref 0.61–1.24)
GFR, Estimated: >60 mL/min (ref >60)
Glucose, Bld: 102 mg/dL — ABNORMAL HIGH (ref 70–99)
Potassium: 4.2 mmol/L (ref 3.5–5.1)
Sodium: 135 mmol/L (ref 135–145)

## 2021-01-12 LAB — MAGNESIUM: Magnesium: 2 mg/dL (ref 1.7–2.4)

## 2021-01-12 LAB — MYCOPLASMA PNEUMONIAE ANTIBODY, IGM: Mycoplasma pneumo IgM: <770 U/mL (ref 0–769)

## 2021-01-12 LAB — T4, FREE: Free T4: 0.74 ng/dL (ref 0.61–1.12)

## 2021-01-12 LAB — AMMONIA: Ammonia: 15 umol/L (ref 9–35)

## 2021-01-12 LAB — CALCIUM, IONIZED: Calcium, Ionized, Serum: 5 mg/dL (ref 4.5–5.6)

## 2021-01-12 MED ORDER — TRAZODONE HCL 50 MG PO TABS
50.0000 mg | ORAL_TABLET | Freq: Every day | ORAL | Status: DC
  Administered 2021-01-12 – 2021-01-17 (×6): 50 mg via ORAL
  Filled 2021-01-12 (×6): qty 1

## 2021-01-12 NOTE — Progress Notes (Signed)
Took mittens off to eat. Patient has been calm this shift. Safety maintained, will continue to monitor.

## 2021-01-12 NOTE — Consult Note (Signed)
   [facility] CM Inpatient Consult   01/12/2021  [name] HAMM Sr. 13-Jun-1951 [id number]   Patient is currently active with [facility] for chronic disease management services.  Patient has been engaged by the care management team RN and LCSW in outpatient setting.  Plan:  Continue to follow for progression and disposition plans.   Of note, [facility] Care Management services does not replace or interfere with any services that are arranged by inpatient case management or social work.  Netta Cedars, MSN, [facility] Liaison Nurse Mobile Phone [phone]  Toll free office [phone]

## 2021-01-12 NOTE — Progress Notes (Signed)
[facility] Progress Note  Patient: [name]    TGY:[id number]  DOA: 01/10/2021     Date of Service: the patient was seen and examined on 01/12/2021  Brief hospital course: Past medical history of paroxysmal A. fib, dementia, depression, anxiety, hypothyroidism. Brought to ER due to worsening bilateral edema, poor p.o. intake and worsening hallucination. Thought to be having community-acquired pneumonia and was started on IV antibiotics. Currently plan is continue to control agitation and hallucination.  Assessment and Plan: 1.  Worsening visual hallucination. Dementia with acute delirium, behavioral disturbances Depression I do not think the patient has acute metabolic encephalopathy. Patient appears to have worsening of his chronic dementia. Recent UTI and medication change with Bactrim and trazodone which may have contributed some. Currently we will resume his home medication which includes Depakote 750 mg every 12 hours, Cymbalta 60 mg daily, Zyprexa 5 mg in the morning and 15 mg nightly and trazodone 50 mg nightly. Normal Depakote level.  LFTs were fine.  Ammonia level normal. Continue sitter.  2.  Concern for community-acquired pneumonia-ruled out. Recent UTI-treated with Bactrim Does not appear to be hypoxic. CT chest shows possibility of atypical pneumonia although procalcitonin level is negative. Currently we will discontinue the biotic and monitor.  3.  Bilateral pedal edema Etiology not clear.  Likely acute on chronic diastolic CHF in the setting of A. fib. Chest x-ray shows possible vascular congestion. Echocardiogram 2020 EF 65%. Good response to 1 dose of IV Lasix, use as needed.  4.  BPH Continue Flomax.  5.  Paroxysmal A. fib HTN Currently rate controlled. Continue anticoagulation. Not on any medication for rate control.  Monitor.  6.  Ulcerative colitis On chronic Imuran.  Continue.  7.  B12 deficiency. Continue supplementation.  8.   Constipation. Likely contributing to confusion. Continue bowel regimen.  9. Hypothyroidism Continue Synthroid.  Diet: Cardiac diet DVT Prophylaxis:   SCDs Start: 01/10/21 1921 apixaban (ELIQUIS) tablet 5 mg    Advance goals of care discussion: DNR  Family Communication: family was present at bedside, at the time of interview.  The pt provided permission to discuss medical plan with the family. Opportunity was given to ask question and all questions were answered satisfactorily.   Disposition:  Status is: Inpatient  Remains inpatient appropriate because:Altered mental status   Dispo: The patient is from: Home              Anticipated d/c is to: Memory care unit versus home              Patient currently is not medically stable to d/c.   Difficult to place patient Yes  Subjective: Appears more calm and comfortable.  Continues to have hallucination.  No nausea no vomiting.  Physical Exam:  General: Appear in mild distress, no Rash; Oral Mucosa Clear, moist. no Abnormal Neck Mass Or lumps, Conjunctiva normal  Cardiovascular: S1 and S2 Present, no Murmur, Respiratory: good respiratory effort, Bilateral Air entry present and faint bilateral crackles, no wheezes Abdomen: Bowel Sound present, Soft and no tenderness Extremities: Trace improving pedal edema Neurology: alert and oriented to time, place, and person affect appropriate. no new focal deficit Gait not checked due to patient safety concerns  Vitals:   01/11/21 2226 01/12/21 0505 01/12/21 0831 01/12/21 1507  BP: 137/83 (!) 151/107 (!) 134/91 129/86  Pulse: 83 84 95 69  Resp:  20 16 20   Temp: 98 F (36.7 C) 97.9 F (36.6 C) 98.5 F (36.9 C)  TempSrc: Oral Oral Oral   SpO2: 92% 96% 100% 94%    Intake/Output Summary (Last 24 hours) at 01/12/2021 1908 Last data filed at 01/12/2021 1130 Gross per 24 hour  Intake 120 ml  Output 900 ml  Net -780 ml   There were no vitals filed for this visit.  Data Reviewed: I  have personally reviewed and interpreted daily labs, tele strips, imaging. I reviewed all nursing notes, pharmacy notes, vitals, pertinent old records I have discussed plan of care as described above with RN and patient/family.  CBC: Recent Labs  Lab 01/10/21 1254 01/11/21 0113  WBC 5.0 5.1  NEUTROABS 4.1 3.9  HGB 13.4 14.0  HCT 39.0 40.3  MCV 102.6* 103.6*  PLT 118* 208*   Basic Metabolic Panel: Recent Labs  Lab 01/10/21 1254 01/10/21 2220 01/11/21 0113 01/12/21 1431  NA 138  --  136 135  K 4.2  --  3.9 4.2  CL 100  --  102 98  CO2 27  --  23 28  GLUCOSE 92  --  88 102*  BUN 8  --  9 14  CREATININE 1.00  --  1.06 1.12  CALCIUM 9.1  --  9.1 9.0  MG  --  1.9 1.8 2.0  PHOS  --   --  3.6  --     Studies: No results found.  Scheduled Meds: . apixaban  5 mg Oral BID  . atorvastatin  20 mg Oral Daily  . azaTHIOprine  200 mg Oral BH-q7a  . divalproex  750 mg Oral Q12H  . DULoxetine  60 mg Oral Daily  . levothyroxine  50 mcg Oral QAC breakfast  . melatonin  10 mg Oral Daily  . OLANZapine  5 mg Oral Daily   And  . OLANZapine  15 mg Oral QHS  . polyethylene glycol  17 g Oral Daily  . senna-docusate  1 tablet Oral BID  . tamsulosin  0.4 mg Oral Daily  . traZODone  50 mg Oral QHS   Continuous Infusions: PRN Meds: acetaminophen **OR** acetaminophen, haloperidol lactate, metoprolol tartrate  Time spent: 35 minutes  Author: Berle Mull, MD [facility] 01/12/2021 7:08 PM  To reach On-call, see care teams to locate the attending and reach out via [email or web address]. Between 7PM-7AM, please contact night-coverage If you still have difficulty reaching the attending provider, please page the [facility] (Director on Call) for [facility] on amion for assistance.

## 2021-01-13 ENCOUNTER — Other Ambulatory Visit: Payer: Self-pay | Admitting: *Deleted

## 2021-01-13 DIAGNOSIS — G9341 Metabolic encephalopathy: Secondary | ICD-10-CM | POA: Diagnosis not present

## 2021-01-13 LAB — GLUCOSE, CAPILLARY: Glucose-Capillary: 93 mg/dL (ref 70–99)

## 2021-01-13 NOTE — Care Management Important Message (Signed)
Important Message  Patient Details IM Letter given to the Patient. Name: [name] [facility] Sr. MRN: [id number] Date of Birth: 07-04-1951   Medicare Important Message Given:  Yes     Kerin Salen 01/13/2021, 11:37 AM

## 2021-01-13 NOTE — Progress Notes (Signed)
[facility] Progress Note  Patient: [name]    YJE:[id number]  DOA: 01/10/2021     Date of Service: the patient was seen and examined on 01/13/2021  Brief hospital course: Past medical history of paroxysmal A. fib, dementia, depression, anxiety, hypothyroidism. Brought to ER due to worsening bilateral edema, poor p.o. intake and worsening hallucination. Thought to be having community-acquired pneumonia and was started on IV antibiotics. Currently plan is arranging safe discharge plan at memory care unit.  Medically stable.  Assessment and Plan: 1.  Worsening visual hallucination. Dementia with acute delirium, behavioral disturbances Depression I do not think the patient has acute metabolic encephalopathy. Patient appears to have worsening of his chronic dementia. Recent UTI and medication change with Bactrim and trazodone which may have contributed some. Currently we will resume his home medication which includes Depakote 750 mg every 12 hours, Cymbalta 60 mg daily, Zyprexa 5 mg in the morning and 15 mg nightly and trazodone 50 mg nightly. Normal Depakote level.  LFTs were fine.  Ammonia level normal. Continue sitter.  2.  Concern for community-acquired pneumonia-ruled out. Recent UTI-treated with Bactrim Does not appear to be hypoxic. CT chest shows possibility of atypical pneumonia although procalcitonin level is negative. Currently we will discontinue the biotic and monitor.  3.  Bilateral pedal edema Etiology not clear.  Likely acute on chronic diastolic CHF in the setting of A. fib. Chest x-ray shows possible vascular congestion. Echocardiogram 2020 EF 65%. Good response to 1 dose of IV Lasix, use as needed.  4.  BPH Continue Flomax.  5.  Paroxysmal A. fib HTN Currently rate controlled. Continue anticoagulation. Not on any medication for rate control.  Monitor.  6.  Ulcerative colitis On chronic Imuran.  Continue.  7.  B12 deficiency. Continue  supplementation.  8.  Constipation.  Resolved. Likely contributing to confusion. Continue bowel regimen.  9. Hypothyroidism Continue Synthroid.  Diet: Cardiac diet DVT Prophylaxis:   SCDs Start: 01/10/21 1921 apixaban (ELIQUIS) tablet 5 mg    Advance goals of care discussion: DNR  Family Communication: family was present at bedside, at the time of interview. Opportunity was given to ask question and all questions were answered satisfactorily.   Disposition:  Status is: Inpatient  Remains inpatient appropriate because: Unsafe discharge planning patient with dementia with psychotic issues.   Dispo: The patient is from: Home              Anticipated d/c is to: Memory care unit versus home              Patient currently is not medically stable to d/c.   Difficult to place patient Yes  Subjective: Sleeping.  No nausea no vomiting but no fever no chills.  Physical Exam:  General: Appear in mild distress, no Rash; Oral Mucosa Clear, moist. no Abnormal Neck Mass Or lumps, Conjunctiva normal  Cardiovascular: S1 and S2 Present, no Murmur, Respiratory: good respiratory effort, Bilateral Air entry present and CTA, no Crackles, no wheezes Abdomen: Bowel Sound present, Soft and no tenderness Extremities: no Pedal edema Neurology: Drowsy, not oriented. affect appropriate. no new focal deficit Gait not checked due to patient safety concerns   Vitals:   01/12/21 2017 01/13/21 0529 01/13/21 0638 01/13/21 1255  BP: 127/79 (!) 145/71  125/75  Pulse: (!) 58 60  (!) 54  Resp: 18 20  16   Temp: 98.9 F (37.2 C) 98.6 F (37 C)  (!) 97.4 F (36.3 C)  TempSrc: Oral Oral  Oral  SpO2: 94% 94%  97%  Weight:   100.5 kg     Intake/Output Summary (Last 24 hours) at 01/13/2021 1852 Last data filed at 01/13/2021 1800 Gross per 24 hour  Intake 720 ml  Output 1350 ml  Net -630 ml   Filed Weights   01/13/21 7001  Weight: 100.5 kg    Data Reviewed: I have personally reviewed and  interpreted daily labs, tele strips, imaging. I reviewed all nursing notes, pharmacy notes, vitals, pertinent old records I have discussed plan of care as described above with RN and patient/family.  CBC: Recent Labs  Lab 01/10/21 1254 01/11/21 0113  WBC 5.0 5.1  NEUTROABS 4.1 3.9  HGB 13.4 14.0  HCT 39.0 40.3  MCV 102.6* 103.6*  PLT 118* 749*   Basic Metabolic Panel: Recent Labs  Lab 01/10/21 1254 01/10/21 2220 01/11/21 0113 01/12/21 1431  NA 138  --  136 135  K 4.2  --  3.9 4.2  CL 100  --  102 98  CO2 27  --  23 28  GLUCOSE 92  --  88 102*  BUN 8  --  9 14  CREATININE 1.00  --  1.06 1.12  CALCIUM 9.1  --  9.1 9.0  MG  --  1.9 1.8 2.0  PHOS  --   --  3.6  --     Studies: No results found.  Scheduled Meds: . apixaban  5 mg Oral BID  . atorvastatin  20 mg Oral Daily  . azaTHIOprine  200 mg Oral BH-q7a  . divalproex  750 mg Oral Q12H  . DULoxetine  60 mg Oral Daily  . levothyroxine  50 mcg Oral QAC breakfast  . melatonin  10 mg Oral Daily  . OLANZapine  5 mg Oral Daily   And  . OLANZapine  15 mg Oral QHS  . senna-docusate  1 tablet Oral BID  . tamsulosin  0.4 mg Oral Daily  . traZODone  50 mg Oral QHS   Continuous Infusions: PRN Meds: acetaminophen **OR** acetaminophen, haloperidol lactate, metoprolol tartrate  Time spent: 35 minutes  Author: Berle Mull, MD [facility] 01/13/2021 6:52 PM  To reach On-call, see care teams to locate the attending and reach out via [email or web address]. Between 7PM-7AM, please contact night-coverage If you still have difficulty reaching the attending provider, please page the [facility] (Director on Call) for [facility] on amion for assistance.

## 2021-01-13 NOTE — Plan of Care (Signed)

## 2021-01-14 DIAGNOSIS — G9341 Metabolic encephalopathy: Secondary | ICD-10-CM | POA: Diagnosis not present

## 2021-01-14 NOTE — NC FL2 (Signed)
[place] LEVEL OF CARE SCREENING TOOL     IDENTIFICATION  Patient Name: [name] MILO Sr. Birthdate: [birth year] Sex: male Admission Date (Current Location): 01/10/2021  County and [place] Number:  Herbalist and Address:  [facility],  [place], [place]      Provider Number: [id number]  Attending Physician Name and Address:  Lavina Hamman, MD  Relative Name and Phone Number:  [facility] Spouse home [phone], cell [phone]    Current Level of Care: Hospital Recommended Level of Care: [place] Prior Approval Number:    Date Approved/Denied:   PASRR Number: [id number]  Discharge Plan: SNF    Current Diagnoses: Patient Active Problem List   Diagnosis Date Noted  . CAP (community acquired pneumonia) 01/11/2021  . AF (paroxysmal atrial fibrillation) ([place]) 01/11/2021  . Chronic diastolic CHF (congestive heart failure) ([place]) 01/10/2021  . Acute metabolic encephalopathy 92/33/0076  . Lower extremity edema 12/09/2020  . Hypotension 12/09/2020  . Type 2 diabetes mellitus without complication ([place]), diet controlled 09/15/2020  . Benign prostatic hyperplasia 09/15/2020  . Chronic ulcerative pancolitis ([place]) 09/14/2020  . Hypercholesterolemia 09/14/2020  . Hypothyroidism   . Vitamin B 12 deficiency   . On statin therapy 05/26/2019  . Chronic anticoagulation 05/26/2019  . QT prolongation 02/19/2019  . Aortic atherosclerosis ([place]) 02/18/2019  . Carotid artery calcification 02/18/2019  . Cardiomegaly 02/18/2019  . Dementia ([place]) 02/17/2019  . Atrial fibrillation with rapid ventricular response ([place]) 02/17/2019  . Hypokalemia 02/17/2019  . GAD (generalized anxiety disorder) 05/14/2018  . Insomnia 11/29/2017  . Moderate obstructive sleep apnea 11/08/2017  . Class 3 severe obesity due to excess calories with body mass index (BMI) of 40.0 to 44.9 in adult ([place]) 11/08/2017  . Major depression,  recurrent, chronic ([place]) 09/18/2017    Orientation RESPIRATION BLADDER Height & Weight     Self,Time,Situation,Place  Normal Incontinent,External catheter Weight: 100.4 kg Height:     BEHAVIORAL SYMPTOMS/MOOD NEUROLOGICAL BOWEL NUTRITION STATUS      Continent Diet (Dysphagia diet)  AMBULATORY STATUS COMMUNICATION OF NEEDS Skin   Extensive Assist Verbally Normal                       Personal Care Assistance Level of Assistance  Bathing,Feeding,Dressing Bathing Assistance: Maximum assistance Feeding assistance: Limited assistance Dressing Assistance: Maximum assistance     Functional Limitations Info  Sight,Hearing,Speech Sight Info: Adequate Hearing Info: Impaired Speech Info: Adequate    SPECIAL CARE FACTORS FREQUENCY  PT (By licensed PT),OT (By licensed OT)     PT Frequency: x5 week OT Frequency: x5 week            Contractures Contractures Info: Not present    Additional Factors Info  Code Status,Allergies Code Status Info: DNR Allergies Info: Losartan Potassium-hctz, Nsaids, Zoloft (Sertraline Hcl), Benzodiazepines           Current Medications (01/14/2021):  This is the current hospital active medication list Current Facility-Administered Medications  Medication Dose Route Frequency Provider Last Rate Last Admin  . acetaminophen (TYLENOL) tablet 650 mg  650 mg Oral Q6H PRN Howerter, Justin B, DO       Or  . acetaminophen (TYLENOL) suppository 650 mg  650 mg Rectal Q6H PRN Howerter, Justin B, DO      . apixaban (ELIQUIS) tablet 5 mg  5 mg Oral BID Lavina Hamman, MD   5 mg at 01/14/21 2263  . atorvastatin (LIPITOR) tablet  20 mg  20 mg Oral Daily Lavina Hamman, MD   20 mg at 01/14/21 1102  . azaTHIOprine (IMURAN) tablet 200 mg  200 mg Oral Arlee Muslim, MD   200 mg at 01/14/21 1117  . divalproex (DEPAKOTE SPRINKLE) capsule 750 mg  750 mg Oral Q12H Lavina Hamman, MD   750 mg at 01/14/21 3567  . DULoxetine (CYMBALTA) DR capsule 60 mg  60  mg Oral Daily Lavina Hamman, MD   60 mg at 01/14/21 0141  . haloperidol lactate (HALDOL) injection 1 mg  1 mg Intramuscular Q6H PRN Mansy, Jan A, MD   1 mg at 01/10/21 2306  . levothyroxine (SYNTHROID) tablet 50 mcg  50 mcg Oral QAC breakfast Lavina Hamman, MD   50 mcg at 01/14/21 0654  . melatonin tablet 10 mg  10 mg Oral Daily Lavina Hamman, MD   10 mg at 01/14/21 0301  . metoprolol tartrate (LOPRESSOR) injection 5 mg  5 mg Intravenous Q20 Min PRN Howerter, Justin B, DO      . OLANZapine (ZYPREXA) tablet 5 mg  5 mg Oral Daily Lavina Hamman, MD   5 mg at 01/14/21 3143   And  . OLANZapine (ZYPREXA) tablet 15 mg  15 mg Oral QHS Lavina Hamman, MD   15 mg at 01/13/21 2338  . senna-docusate (Senokot-S) tablet 1 tablet  1 tablet Oral BID Lavina Hamman, MD   1 tablet at 01/14/21 8887  . tamsulosin (FLOMAX) capsule 0.4 mg  0.4 mg Oral Daily Lavina Hamman, MD   0.4 mg at 01/14/21 5797  . traZODone (DESYREL) tablet 50 mg  50 mg Oral QHS Lavina Hamman, MD   50 mg at 01/13/21 2338     Discharge Medications: Please see discharge summary for a list of discharge medications.  Relevant Imaging Results:  Relevant Lab Results:   Additional Information KQ#[id number]  Purcell Mouton, RN

## 2021-01-14 NOTE — Plan of Care (Signed)
  Problem: Health Behavior/Discharge Planning: Goal: Ability to manage health-related needs will improve Outcome: Progressing   Problem: Clinical Measurements: Goal: Ability to maintain clinical measurements within normal limits will improve Outcome: Progressing Goal: Will remain free from infection Outcome: Progressing Goal: Diagnostic test results will improve Outcome: Progressing Goal: Respiratory complications will improve Outcome: Progressing Goal: Cardiovascular complication will be avoided Outcome: Progressing   Problem: Coping: Goal: Level of anxiety will decrease Outcome: Progressing   Problem: Safety: Goal: Ability to remain free from injury will improve Outcome: Progressing

## 2021-01-14 NOTE — Progress Notes (Signed)
[facility] Progress Note  Patient: [name]    HER:[id number]  DOA: 01/10/2021     Date of Service: the patient was seen and examined on 01/14/2021  Brief hospital course: Past medical history of paroxysmal A. fib, dementia, depression, anxiety, hypothyroidism. Brought to ER due to worsening bilateral edema, poor p.o. intake and worsening hallucination. Thought to be having community-acquired pneumonia and was started on IV antibiotics. Currently plan is arranging safe discharge plan at memory care unit.  Medically stable.  Assessment and Plan: 1.  Worsening visual hallucination. Dementia with acute delirium, behavioral disturbances Depression I do not think the patient has acute metabolic encephalopathy. Patient appears to have worsening of his chronic dementia. Recent UTI and medication change with Bactrim and trazodone which may have contributed some. Currently we will resume his home medication which includes Depakote 750 mg every 12 hours, Cymbalta 60 mg daily, Zyprexa 5 mg in the morning and 15 mg nightly and trazodone 50 mg nightly. Normal Depakote level. LFTs were fine.  Ammonia level normal. Continue sitter.  2.  Concern for community-acquired pneumonia-ruled out. Recent UTI-treated with Bactrim Does not appear to be hypoxic. CT chest shows possibility of atypical pneumonia although procalcitonin level is negative. Currently we will discontinue the biotic and monitor.  3.  Bilateral pedal edema Etiology not clear.  Likely acute on chronic diastolic CHF in the setting of A. fib. Chest x-ray shows possible vascular congestion. Echocardiogram 2020 EF 65%. Good response to 1 dose of IV Lasix, use as needed.  4.  BPH Continue Flomax.  5.  Paroxysmal A. fib HTN Currently rate controlled. Continue anticoagulation. Not on any medication for rate control.  Monitor.  6.  Ulcerative colitis On chronic Imuran.  Continue.  7.  B12 deficiency. Continue  supplementation.  8.  Constipation.  Resolved. Likely contributing to confusion. Continue bowel regimen.  9. Hypothyroidism Continue Synthroid.  Diet: Cardiac diet DVT Prophylaxis:   SCDs Start: 01/10/21 1921 apixaban (ELIQUIS) tablet 5 mg    Advance goals of care discussion: DNR  Family Communication: no family was present at bedside, at the time of interview.  Disposition:  Status is: Inpatient  Remains inpatient appropriate because: Unsafe discharge planning patient with dementia with psychotic issues.   Dispo: The patient is from: Home              Anticipated d/c is to: Memory care unit versus home              Patient currently is not medically stable to d/c.   Difficult to place patient Yes  Subjective: No nausea no vomiting.  No fever no chills.  Still remains with confusion.  Did not hear any hallucination for now.  Oral intake improving.  Physical Exam:  General: Appear in mild distress, no Rash; Oral Mucosa Clear, moist. no Abnormal Neck Mass Or lumps, Conjunctiva normal  Cardiovascular: S1 and S2 Present, no Murmur, Respiratory: good respiratory effort, Bilateral Air entry present and CTA, no Crackles, no wheezes Abdomen: Bowel Sound present, Soft and no tenderness Extremities: no Pedal edema Neurology: alert and not oriented to time, place, and person affect appropriate. no new focal deficit Gait not checked due to patient safety concerns    Vitals:   01/14/21 0459 01/14/21 0534 01/14/21 1310 01/14/21 1313  BP: (!) 143/102 (!) 156/124 119/80 131/78  Pulse: 65 73 (!) 39 83  Resp: 20  20 20   Temp: 97.8 F (36.6 C)  98.1 F (36.7 C) 98.1  F (36.7 C)  TempSrc: Oral  Oral   SpO2: 96%  94% 90%  Weight:        Intake/Output Summary (Last 24 hours) at 01/14/2021 1920 Last data filed at 01/14/2021 1318 Gross per 24 hour  Intake --  Output 1100 ml  Net -1100 ml   Filed Weights   01/13/21 0638 01/14/21 0455  Weight: 100.5 kg 100.4 kg    Data  Reviewed: I have personally reviewed and interpreted daily labs, tele strips, imaging. I reviewed all nursing notes, pharmacy notes, vitals, pertinent old records I have discussed plan of care as described above with RN and patient/family.  CBC: Recent Labs  Lab 01/10/21 1254 01/11/21 0113  WBC 5.0 5.1  NEUTROABS 4.1 3.9  HGB 13.4 14.0  HCT 39.0 40.3  MCV 102.6* 103.6*  PLT 118* 240*   Basic Metabolic Panel: Recent Labs  Lab 01/10/21 1254 01/10/21 2220 01/11/21 0113 01/12/21 1431  NA 138  --  136 135  K 4.2  --  3.9 4.2  CL 100  --  102 98  CO2 27  --  23 28  GLUCOSE 92  --  88 102*  BUN 8  --  9 14  CREATININE 1.00  --  1.06 1.12  CALCIUM 9.1  --  9.1 9.0  MG  --  1.9 1.8 2.0  PHOS  --   --  3.6  --     Studies: No results found.  Scheduled Meds: . apixaban  5 mg Oral BID  . atorvastatin  20 mg Oral Daily  . azaTHIOprine  200 mg Oral BH-q7a  . divalproex  750 mg Oral Q12H  . DULoxetine  60 mg Oral Daily  . levothyroxine  50 mcg Oral QAC breakfast  . melatonin  10 mg Oral Daily  . OLANZapine  5 mg Oral Daily   And  . OLANZapine  15 mg Oral QHS  . senna-docusate  1 tablet Oral BID  . tamsulosin  0.4 mg Oral Daily  . traZODone  50 mg Oral QHS   Continuous Infusions: PRN Meds: acetaminophen **OR** acetaminophen, haloperidol lactate, metoprolol tartrate  Time spent: 35 minutes  Author: Berle Mull, MD [facility] 01/14/2021 7:20 PM  To reach On-call, see care teams to locate the attending and reach out via [email or web address]. Between 7PM-7AM, please contact night-coverage If you still have difficulty reaching the attending provider, please page the [facility] (Director on Call) for [facility] on amion for assistance.

## 2021-01-14 NOTE — TOC Progression Note (Addendum)
Transition of Care (TOC) - Progression Note    Patient Details  Name: OMAIR DETTMER Sr. MRN: [id number] Date of Birth: 1950-12-16  Transition of Care [facility]) CM/SW Contact  Purcell Mouton, RN Phone Number: 01/14/2021, 11:37 AM  Clinical Narrative:    Will need PT/OT for insurance authorization. FL2 was fax to SNF's. Waiting for a bed offer.    Expected Discharge Plan: Home/Self Care Barriers to Discharge: No Barriers Identified  Expected Discharge Plan and Services Expected Discharge Plan: Home/Self Care       Living arrangements for the past 2 months: Single Family Home                                       Social Determinants of Health (SDOH) Interventions    Readmission Risk Interventions Readmission Risk Prevention Plan 02/18/2019  Transportation Screening Complete  PCP or Specialist Appt within 3-5 Days Complete  HRI or [place] Complete  Social Work Consult for [place] Planning/Counseling Complete  Palliative Care Screening Not Applicable  Medication Review [employer]) Complete  Some recent data might be hidden

## 2021-01-14 NOTE — Plan of Care (Signed)

## 2021-01-15 DIAGNOSIS — G9341 Metabolic encephalopathy: Secondary | ICD-10-CM | POA: Diagnosis not present

## 2021-01-15 MED ORDER — MELATONIN 5 MG PO TABS
10.0000 mg | ORAL_TABLET | Freq: Every day | ORAL | Status: DC
  Administered 2021-01-15 – 2021-01-17 (×3): 10 mg via ORAL
  Filled 2021-01-15 (×3): qty 2

## 2021-01-15 MED ORDER — LORATADINE 10 MG PO TABS
10.0000 mg | ORAL_TABLET | Freq: Every day | ORAL | Status: DC
  Administered 2021-01-15 – 2021-01-18 (×4): 10 mg via ORAL
  Filled 2021-01-15 (×4): qty 1

## 2021-01-15 NOTE — Evaluation (Signed)
Physical Therapy Evaluation Patient Details Name: [name]. MRN: [id number] DOB: [birth year] Today's Date: 01/15/2021   History of Present Illness  70 year old male brought to ER due to worsening bilateral edema, poor p.o. intake and worsening hallucination.  Thought to be having community-acquired pneumonia. Past medical history of paroxysmal A. fib, dementia, depression, anxiety, hypothyroidism. Currently plan is arranging safe discharge plan  Clinical Impression  On eval, pt required Min assist for safe mobility. He walked ~60 feet with a Rw. Pt presents with general weakness, decreased activity tolerance, and impaired gait and balance. Pt is a fall risk when mobilizing. He requires multimodal cueing. He is easily distracted but also easily redirected. No family present during session. Recommendation is for SNF for rehab.     Follow Up Recommendations SNF    Equipment Recommendations  None recommended by PT    Recommendations for Other Services       Precautions / Restrictions Precautions Precautions: Fall Restrictions Weight Bearing Restrictions: No      Mobility  Bed Mobility Overal bed mobility: Needs Assistance Bed Mobility: Sit to Supine      Sit to supine: Min assist   General bed mobility comments: A for LEs onto bed and for repositioning. Cues provided.    Transfers Overall transfer level: Needs assistance Equipment used: None;Rolling walker (2 wheeled) Transfers: Sit to/from Stand Sit to Stand: Min assist        General transfer comment: Unsteady. Assist to power up, steady, control descent. Cues for safety, technique. Appeared very unsteady without UE support so stood with RW 2nd time.  Ambulation/Gait Ambulation/Gait assistance: Min assist Gait Distance (Feet): 60 Feet Assistive device: Rolling walker (2 wheeled) Gait Pattern/deviations: Shuffle;Trunk flexed     General Gait Details: Multimodal repeated cueing for safety, posture, RW  proximity, step length. Pt would briefly correct step lengths but then revert back to shuffle gait. Unsteady and a fall risk. Tolerated distance fairly well. Dyspnea 2/4.  Stairs            Wheelchair Mobility    Modified Rankin (Stroke Patients Only)       Balance Overall balance assessment: Needs assistance Sitting-balance support: No upper extremity supported;Feet supported Sitting balance-Leahy Scale: Fair Sitting balance - Comments: close min guard as he initially leaned back at edge of bed   Standing balance support: Bilateral upper extremity supported Standing balance-Leahy Scale: Poor                               Pertinent Vitals/Pain Pain Assessment: Faces Faces Pain Scale: No hurt    Home Living Family/patient expects to be discharged to:: Private residence Living Arrangements: Spouse/significant other Available Help at Discharge: Family Type of Home: House           Additional Comments: patient unable to provide details of home situation. Living at home with spouse.    Prior Function           Comments: Unsure-no family present to provide history/PLOF     Hand Dominance   Dominant Hand: Right    Extremity/Trunk Assessment   Upper Extremity Assessment Upper Extremity Assessment: Defer to OT evaluation    Lower Extremity Assessment Lower Extremity Assessment: Generalized weakness    Cervical / Trunk Assessment Cervical / Trunk Assessment: Normal  Communication   Communication: No difficulties  Cognition Arousal/Alertness: Awake/alert Behavior During Therapy: WFL for tasks assessed/performed Overall Cognitive Status: History of  cognitive impairments - at baseline                                 General Comments: Follows commands inconsisently. Conversation predominantly inappropriate to context - perseverates on talking about prior work/work type things.      General Comments      Exercises      Assessment/Plan    PT Assessment Patient needs continued PT services  PT Problem List Decreased strength;Decreased mobility;Decreased activity tolerance;Decreased cognition;Decreased safety awareness;Decreased balance;Decreased knowledge of use of DME       PT Treatment Interventions DME instruction;Gait training;Therapeutic exercise;Balance training;Functional mobility training;Therapeutic activities;Patient/family education    PT Goals (Current goals can be found in the Care Plan section)  Acute Rehab PT Goals Patient Stated Goal: pt unable to state PT Goal Formulation: Patient unable to participate in goal setting Time For Goal Achievement: 01/29/21 Potential to Achieve Goals: Fair    Frequency Min 2X/week   Barriers to discharge        Co-evaluation               AM-PAC PT "6 Clicks" Mobility  Outcome Measure Help needed turning from your back to your side while in a flat bed without using bedrails?: A Little Help needed moving from lying on your back to sitting on the side of a flat bed without using bedrails?: A Little Help needed moving to and from a bed to a chair (including a wheelchair)?: A Little Help needed standing up from a chair using your arms (e.g., wheelchair or bedside chair)?: A Little Help needed to walk in hospital room?: A Little Help needed climbing 3-5 steps with a railing? : Total 6 Click Score: 16    End of Session Equipment Utilized During Treatment: Gait belt Activity Tolerance: Patient tolerated treatment well Patient left: in bed;with call bell/phone within reach;with bed alarm set (with mittens on)   PT Visit Diagnosis: History of falling (Z91.81);Unsteadiness on feet (R26.81);Muscle weakness (generalized) (M62.81);Difficulty in walking, not elsewhere classified (R26.2)    Time: 1856-3149 PT Time Calculation (min) (ACUTE ONLY): 12 min   Charges:   PT Evaluation $PT Eval Moderate Complexity: 1 Mod            Doreatha Massed,  PT Acute Rehabilitation  Office: [phone] Pager: [phone]

## 2021-01-15 NOTE — Evaluation (Signed)
Occupational Therapy Evaluation Patient Details Name: [name] SUNDE Sr. MRN: [id number] DOB: September 08, 1951 Today's Date: 01/15/2021    History of Present Illness MR. [name] is a 70 year old man brought to ER due to worsening bilateral edema, poor p.o. intake and worsening hallucination.  Thought to be having community-acquired pneumonia. Past medical history of paroxysmal A. fib, dementia, depression, anxiety, hypothyroidism. Currently plan is arranging safe discharge plan at memory care unit. Medically stable.   Clinical Impression   Mr. [name] is a 70 year old man with above medical history who today presents with dementia, impaired balance and decreased activity tolerance resulting in a decreased in functional abilities. Patient is known to therapist from prior admission. During admission patient improved to independence with functional mobility and ADLs. Patient has been at home with spouse - unsure of specific abilities. Today patient needing assistance with bed mobility, standing and taking steps as he is unsteady. He is able to follow commands inconsistently - mostly in context and with tactile cues to follow. Due to impaired balance patient needing assistance with ADLs as well. Patient's ability to comprehend and participate in conversation is limited - as patient's answers mostly in regards to his prior work or work type things despite the question .Patient will benefit from skilled OT services while in hospital to improve deficits and improve functional abilities to reduce caregiver burden. At this time patient may need short term rehab prior to placement in memory care unit in order to improve functional abilities. Memory Care unit would be a suitable discharge plan as well if they can provide him with min assist initially.    Follow Up Recommendations  SNF;Other (comment)    Equipment Recommendations  None recommended by OT    Recommendations for Other Services        Precautions / Restrictions Precautions Precautions: Fall Restrictions Weight Bearing Restrictions: No      Mobility Bed Mobility Overal bed mobility: Needs Assistance Bed Mobility: Supine to Sit     Supine to sit: Mod assist     General bed mobility comments: MOd assist to get out of bed - hand hold to pull up on and assistance to scoot forward using bed pad    Transfers Overall transfer level: Needs assistance Equipment used: None Transfers: Sit to/from [facility] Sit to Stand: Min assist Stand pivot transfers: Min assist       General transfer comment: MIn assist for sit to stand and min assist for steadying to take steps to recliner.    Balance Overall balance assessment: Needs assistance Sitting-balance support: No upper extremity supported;Feet supported Sitting balance-Leahy Scale: Fair Sitting balance - Comments: close min guard as he initially leaned back at edge of bed   Standing balance support: Single extremity supported Standing balance-Leahy Scale: Poor                             ADL either performed or assessed with clinical judgement   ADL Overall ADL's : Needs assistance/impaired Eating/Feeding: Supervision/ safety;Set up Eating/Feeding Details (indicate cue type and reason): needs set up and supervision to continue to eat Grooming: Set up;Sitting   Upper Body Bathing: Set up;Supervision/ safety   Lower Body Bathing: Moderate assistance;Set up;Supervison/ safety   Upper Body Dressing : Set up;Sitting   Lower Body Dressing: Moderate assistance;Sit to/from stand Lower Body Dressing Details (indicate cue type and reason): able to bend down to manage socks, unsteady  and needs assistance to pull clothing up Toilet Transfer: Minimal assistance;Stand-pivot;BSC   Toileting- Clothing Manipulation and Hygiene: Total assistance Toileting - Clothing Manipulation Details (indicate cue type and reason): external catheter              Vision   Vision Assessment?: No apparent visual deficits     Perception     Praxis      Pertinent Vitals/Pain Pain Assessment: Faces Faces Pain Scale: No hurt     Hand Dominance Right   Extremity/Trunk Assessment Upper Extremity Assessment Upper Extremity Assessment: Overall WFL for tasks assessed (WFL ROM, strong upper body strength)   Lower Extremity Assessment Lower Extremity Assessment: Defer to PT evaluation   Cervical / Trunk Assessment Cervical / Trunk Assessment: Normal   Communication Communication Communication: No difficulties   Cognition Arousal/Alertness: Awake/alert Behavior During Therapy: WFL for tasks assessed/performed Overall Cognitive Status: History of cognitive impairments - at baseline                                 General Comments: Follows commands inconsisently. Conversation predominantly inappropriate to context - perseverates on talking about prior work/work type things.   General Comments       Exercises     Shoulder Instructions      Home Living Family/patient expects to be discharged to:: Unsure Living Arrangements: Spouse/significant other                               Additional Comments: patient unable to provide details of home situation. Living at home with spouse.      Prior Functioning/Environment          Comments: Unsure since no family in room. Was able to advance to independent during prior admission.        OT Problem List: Impaired balance (sitting and/or standing);Decreased activity tolerance;Decreased cognition;Decreased safety awareness;Increased edema      OT Treatment/Interventions: Self-care/ADL training;DME and/or AE instruction;Therapeutic activities;Balance training;Patient/family education    OT Goals(Current goals can be found in the care plan section) Acute Rehab OT Goals OT Goal Formulation: Patient unable to participate in goal setting Time For Goal  Achievement: 01/29/21 Potential to Achieve Goals: Good  OT Frequency: Min 2X/week   Barriers to D/C:            Co-evaluation              AM-PAC OT "6 Clicks" Daily Activity     Outcome Measure Help from another person eating meals?: A Little Help from another person taking care of personal grooming?: A Little Help from another person toileting, which includes using toliet, bedpan, or urinal?: A Little Help from another person bathing (including washing, rinsing, drying)?: A Lot Help from another person to put on and taking off regular upper body clothing?: A Little Help from another person to put on and taking off regular lower body clothing?: A Lot 6 Click Score: 16   End of Session Nurse Communication: Mobility status  Activity Tolerance: Patient tolerated treatment well Patient left: in chair;with call bell/phone within reach;with chair alarm set  OT Visit Diagnosis: Unsteadiness on feet (R26.81);Other symptoms and signs involving cognitive function                Time: 0626-9485 OT Time Calculation (min): 28 min Charges:  OT General Charges $OT Visit: 1 Visit OT Evaluation $OT  Eval Low Complexity: 1 Low OT Treatments $Self Care/Home Management : 8-22 mins  Colbie Danner, OTR/L [place]  Office [phone] Pager: [phone]   Lenward Chancellor 01/15/2021, 10:45 AM

## 2021-01-15 NOTE — Progress Notes (Signed)
[facility] Progress Note  Patient: [name]    ZTI:[id number]  DOA: 01/10/2021     Date of Service: the patient was seen and examined on 01/15/2021  Brief hospital course: Past medical history of paroxysmal A. fib, dementia, depression, anxiety, hypothyroidism. Brought to ER due to worsening bilateral edema, poor p.o. intake and worsening hallucination. Thought to be having community-acquired pneumonia and was started on IV antibiotics. Currently plan is arranging safe discharge plan at memory care unit.  Medically stable.  Assessment and Plan: 1.  Worsening visual hallucination. Dementia with acute delirium, behavioral disturbances Depression I do not think the patient has acute metabolic encephalopathy. Patient appears to have worsening of his chronic dementia. Recent UTI and medication change with Bactrim and trazodone which may have contributed some. Currently we will resume his home medication which includes Depakote 750 mg every 12 hours, Cymbalta 60 mg daily, Zyprexa 5 mg in the morning and 15 mg nightly and trazodone 50 mg nightly. Normal Depakote level. LFTs were fine. Ammonia level normal. Continue sitter.  2.  Concern for community-acquired pneumonia-ruled out. Recent UTI-treated with Bactrim Does not appear to be hypoxic. CT chest shows possibility of atypical pneumonia although procalcitonin level is negative. Currently we will discontinue the biotic and monitor.  3.  Bilateral pedal edema Etiology not clear.  Likely acute on chronic diastolic CHF in the setting of A. fib. Chest x-ray shows possible vascular congestion. Echocardiogram 2020 EF 65%. Good response to 1 dose of IV Lasix, use as needed.  4.  BPH Continue Flomax.  5.  Paroxysmal A. fib HTN Currently rate controlled. Continue anticoagulation. Not on any medication for rate control.  Monitor.  6.  Ulcerative colitis On chronic Imuran.  Continue.  7.  B12 deficiency. Continue  supplementation.  8.  Constipation.  Resolved. Likely contributing to confusion. Continue bowel regimen.  9. Hypothyroidism Continue Synthroid.  Diet: Cardiac diet DVT Prophylaxis:   SCDs Start: 01/10/21 1921 apixaban (ELIQUIS) tablet 5 mg    Advance goals of care discussion: DNR  Family Communication: no family was present at bedside, at the time of interview.  Disposition:  Status is: Inpatient  Remains inpatient appropriate because: Unsafe discharge planning patient with dementia with psychotic issues.  Dispo: The patient is from: Home              Anticipated d/c is to: Memory care unit versus home              Patient currently is not medically stable to d/c.   Difficult to place patient Yes  Subjective: No nausea no vomiting.  No fever no chills.  No acute complaint.  Remains confused with delusion.  Physical Exam:  General: Appear in mild distress, no Rash; Oral Mucosa Clear, moist. no Abnormal Neck Mass Or lumps, Conjunctiva normal  Cardiovascular: S1 and S2 Present, no Murmur, Respiratory: good respiratory effort, Bilateral Air entry present and CTA, no Crackles, no wheezes Abdomen: Bowel Sound present, Soft and no tenderness Extremities: trace Pedal edema Neurology: alert and oriented to time, place, and person affect appropriate. no new focal deficit Gait not checked due to patient safety concerns  Vitals:   01/14/21 2238 01/15/21 0500 01/15/21 0617 01/15/21 1244  BP: 120/81  126/87 (!) 117/94  Pulse: (!) 54  90 (!) 109  Resp: 20  18 17   Temp: 98.6 F (37 C)  97.6 F (36.4 C) 98.4 F (36.9 C)  TempSrc: Oral  Oral Oral  SpO2: 96%  97%  Weight:  100.2 kg      Intake/Output Summary (Last 24 hours) at 01/15/2021 1415 Last data filed at 01/15/2021 1246 Gross per 24 hour  Intake 240 ml  Output 700 ml  Net -460 ml   Filed Weights   01/13/21 0638 01/14/21 0455 01/15/21 0500  Weight: 100.5 kg 100.4 kg 100.2 kg    Data Reviewed: I have personally  reviewed and interpreted daily labs, tele strips, imaging. I reviewed all nursing notes, pharmacy notes, vitals, pertinent old records I have discussed plan of care as described above with RN and patient/family.  CBC: Recent Labs  Lab 01/10/21 1254 01/11/21 0113  WBC 5.0 5.1  NEUTROABS 4.1 3.9  HGB 13.4 14.0  HCT 39.0 40.3  MCV 102.6* 103.6*  PLT 118* 539*   Basic Metabolic Panel: Recent Labs  Lab 01/10/21 1254 01/10/21 2220 01/11/21 0113 01/12/21 1431  NA 138  --  136 135  K 4.2  --  3.9 4.2  CL 100  --  102 98  CO2 27  --  23 28  GLUCOSE 92  --  88 102*  BUN 8  --  9 14  CREATININE 1.00  --  1.06 1.12  CALCIUM 9.1  --  9.1 9.0  MG  --  1.9 1.8 2.0  PHOS  --   --  3.6  --     Studies: No results found.  Scheduled Meds: . apixaban  5 mg Oral BID  . atorvastatin  20 mg Oral Daily  . azaTHIOprine  200 mg Oral BH-q7a  . divalproex  750 mg Oral Q12H  . DULoxetine  60 mg Oral Daily  . levothyroxine  50 mcg Oral QAC breakfast  . loratadine  10 mg Oral Daily  . melatonin  10 mg Oral QHS  . OLANZapine  5 mg Oral Daily   And  . OLANZapine  15 mg Oral QHS  . senna-docusate  1 tablet Oral BID  . tamsulosin  0.4 mg Oral Daily  . traZODone  50 mg Oral QHS   Continuous Infusions: PRN Meds: acetaminophen **OR** acetaminophen, haloperidol lactate, metoprolol tartrate  Time spent: 35 minutes  Author: Berle Mull, MD [facility] 01/15/2021 2:15 PM  To reach On-call, see care teams to locate the attending and reach out via [email or web address]. Between 7PM-7AM, please contact night-coverage If you still have difficulty reaching the attending provider, please page the [facility] (Director on Call) for [facility] on amion for assistance.

## 2021-01-16 ENCOUNTER — Encounter: Payer: Self-pay | Admitting: *Deleted

## 2021-01-16 ENCOUNTER — Ambulatory Visit: Payer: Medicare HMO | Admitting: Family Medicine

## 2021-01-16 DIAGNOSIS — G9341 Metabolic encephalopathy: Secondary | ICD-10-CM | POA: Diagnosis not present

## 2021-01-16 LAB — CULTURE, BLOOD (ROUTINE X 2)
Culture: NO GROWTH
Culture: NO GROWTH
Special Requests: ADEQUATE
Special Requests: ADEQUATE

## 2021-01-16 MED ORDER — FUROSEMIDE 40 MG PO TABS
40.0000 mg | ORAL_TABLET | ORAL | Status: DC
  Administered 2021-01-16: 40 mg via ORAL
  Filled 2021-01-16: qty 1

## 2021-01-16 NOTE — Plan of Care (Signed)

## 2021-01-16 NOTE — Progress Notes (Signed)
[facility] PROGRESS NOTE  Patient: [name] Sr. CLT:[id number]   PCP: Howard Pouch A, DO DOB: 08/09/1951   DOA: 01/10/2021   DOS: 01/16/2021    Subjective: No nausea no vomiting.  No hallucination.  No chest pain.  No shortness of breath.  Objective:  Vitals:   01/16/21 0456 01/16/21 1302  BP: 110/87 (!) 120/107  Pulse: (!) 57 61  Resp: 18 16  Temp: 98.1 F (36.7 C) 98.1 F (36.7 C)  SpO2:  (!) 87%    Bilateral basal crackles. S1-S2 present. Alert awake, not oriented. Able to follow command. Bilateral lower extremity edema.  Assessment and plan: Lower extremity edema. Biweekly Lasix. Continue current regimen for chronic dementia  Author: Berle Mull, MD [facility] 01/16/2021 8:27 PM   If 7PM-7AM, please contact night-coverage at [email or web address]

## 2021-01-16 NOTE — Patient Outreach (Signed)
Care Management Clinical Social Work Note  01/16/2021 Name: [name] CLAUSON Sr. MRN: [id number] DOB: 20-Jan-1951  [name] Flesher Sr. is a 70 y.o. year old male who is a primary care patient of [name], [name], DO.  The Care Management team was consulted for assistance with chronic disease management and coordination needs.  Engaged with patient's wife, [name] by telephone for follow up visit in response to provider referral for social work chronic care management and care coordination services  Consent to Services:  [name] was given information about Care Management services today including:  1. Care Management services includes personalized support from designated clinical staff supervised by his physician, including individualized plan of care and coordination with other care providers 2. 24/7 contact phone numbers for assistance for urgent and routine care needs. 3. The patient may stop case management services at any time by phone call to the office staff.  Patient agreed to services and consent obtained.   Assessment: Review of patient past medical history, allergies, medications, and health status, including review of relevant consultants reports was performed today as part of a comprehensive evaluation and provision of chronic care management and care coordination services.  SDOH (Social Determinants of Health) assessments and interventions performed:    Advanced Directives Status: Advanced Dirctives (Living Will and [place]), as well as Do Not Resusitate Orders, are in place.  Care Plan  Allergies  Allergen Reactions  . Losartan Potassium-Hctz Diarrhea  . Nsaids     On eliquis.   Marland Kitchen Zoloft [Sertraline Hcl] Diarrhea  . Benzodiazepines Other (See Comments)    Agitation.    Facility-Administered Encounter Medications as of 01/13/2021  Medication  . acetaminophen (TYLENOL) tablet 650 mg   Or  . acetaminophen (TYLENOL) suppository 650 mg  .  apixaban (ELIQUIS) tablet 5 mg  . atorvastatin (LIPITOR) tablet 20 mg  . azaTHIOprine (IMURAN) tablet 200 mg  . divalproex (DEPAKOTE SPRINKLE) capsule 750 mg  . DULoxetine (CYMBALTA) DR capsule 60 mg  . haloperidol lactate (HALDOL) injection 1 mg  . levothyroxine (SYNTHROID) tablet 50 mcg  . metoprolol tartrate (LOPRESSOR) injection 5 mg  . OLANZapine (ZYPREXA) tablet 5 mg   And  . OLANZapine (ZYPREXA) tablet 15 mg  . senna-docusate (Senokot-S) tablet 1 tablet  . tamsulosin (FLOMAX) capsule 0.4 mg  . traZODone (DESYREL) tablet 50 mg  . [DISCONTINUED] melatonin tablet 10 mg   Outpatient Encounter Medications as of 01/13/2021  Medication Sig  . acetaminophen (TYLENOL) 500 MG tablet Take 500 mg by mouth every 6 (six) hours as needed for moderate pain.  Marland Kitchen apixaban (ELIQUIS) 5 MG TABS tablet Take 1 tablet (5 mg total) by mouth 2 (two) times daily.  Marland Kitchen atorvastatin (LIPITOR) 20 MG tablet Take 1 tablet (20 mg total) by mouth daily.  Marland Kitchen azaTHIOprine (IMURAN) 50 MG tablet Take 200 mg by mouth every morning.  . Cholecalciferol (VITAMIN D3) 50 MCG (2000 UT) CAPS Take 2,000 Units by mouth daily.  . divalproex (DEPAKOTE SPRINKLE) 125 MG capsule Take 6 capsules (750 mg total) by mouth every 12 (twelve) hours.  . DULoxetine (CYMBALTA) 60 MG capsule Take 1 capsule (60 mg total) by mouth daily.  Marland Kitchen levocetirizine (XYZAL) 5 MG tablet Take 5 mg by mouth every evening.  Marland Kitchen levothyroxine (SYNTHROID) 50 MCG tablet Take 1 tablet (50 mcg total) by mouth daily before breakfast.  . Melatonin 10 MG TABS Take 10 mg by mouth daily.  Marland Kitchen OLANZapine (ZYPREXA) 10 MG tablet 1/2  tab in the morning and 1.5 tabs QHS (Patient taking differently: Take 5-15 mg by mouth See admin instructions. 1/2 tab in the morning and 1.5 tabs QHS)  . potassium chloride (KLOR-CON M10) 10 MEQ tablet Take 1 tablet (10 mEq total) by mouth daily.  Marland Kitchen sulfamethoxazole-trimethoprim (BACTRIM DS) 800-160 MG tablet Take 1 tablet by mouth 2 (two) times daily.  (Patient taking differently: Take 1 tablet by mouth 2 (two) times daily. Start date : 01/06/21)  . tamsulosin (FLOMAX) 0.4 MG CAPS capsule Take 1 capsule (0.4 mg total) by mouth daily.  . traZODone (DESYREL) 50 MG tablet TAKE 1 TABLET BY MOUTH EVERYDAY AT BEDTIME (Patient taking differently: Take 50 mg by mouth at bedtime.)  . vitamin E 1000 UNIT capsule Take 1,000 Units by mouth daily.    Patient Active Problem List   Diagnosis Date Noted  . CAP (community acquired pneumonia) 01/11/2021  . AF (paroxysmal atrial fibrillation) ([place]) 01/11/2021  . Chronic diastolic CHF (congestive heart failure) ([place]) 01/10/2021  . Acute metabolic encephalopathy 37/85/8850  . Lower extremity edema 12/09/2020  . Hypotension 12/09/2020  . Type 2 diabetes mellitus without complication ([place]), diet controlled 09/15/2020  . Benign prostatic hyperplasia 09/15/2020  . Chronic ulcerative pancolitis ([place]) 09/14/2020  . Hypercholesterolemia 09/14/2020  . Hypothyroidism   . Vitamin B 12 deficiency   . On statin therapy 05/26/2019  . Chronic anticoagulation 05/26/2019  . QT prolongation 02/19/2019  . Aortic atherosclerosis ([place]) 02/18/2019  . Carotid artery calcification 02/18/2019  . Cardiomegaly 02/18/2019  . Dementia ([place]) 02/17/2019  . Atrial fibrillation with rapid ventricular response ([place]) 02/17/2019  . Hypokalemia 02/17/2019  . GAD (generalized anxiety disorder) 05/14/2018  . Insomnia 11/29/2017  . Moderate obstructive sleep apnea 11/08/2017  . Class 3 severe obesity due to excess calories with body mass index (BMI) of 40.0 to 44.9 in adult ([place]) 11/08/2017  . Major depression, recurrent, chronic ([place]) 09/18/2017    Conditions to be addressed/monitored: Dementia w/ Behavioral Disturbances and Worsening Hallucinations. Limited Social Support, Level of Care Concerns, Mental Health Concerns, Limited Access to Caregiver, Cognitive Deficits, Memory Deficits and Inability to Perform ADL's/IADL's  Independently.  Care Plan : LCSW Plan of Care  Updates made by Francis Gaines, LCSW since 01/16/2021 12:00 AM    Problem: Maintain Quality of Life through [facility] versus Long-Term Memory Care Assisted Living Placement. Resolved 01/16/2021  Priority: High    Goal: Maintain Quality of Life through [facility] versus Long-Term Memory Care Assisted Living Placement. Completed 01/16/2021  Start Date: 12/13/2020  Expected End Date: 01/16/2021  This Visit's Progress: On track  Recent Progress: On track  Priority: High  Note:    Current barriers:    Patient with diagnosis of Dementia and overall declining health, is unable to consistently perform instrumental activities of daily living (IADL's) or routine activities of daily living (ADL's) independently.  Patient and wife are unable to navigate available community agencies and resources, nor are they aware of their options.    Patient is unable to independently self manage needs related to chronic health conditions, and unable to self-administer prescription medications appropriately. Clinical Goals:  . Over the next 30 to 45 days, patient will have in-home care services in place, or receive placement into a long-term memory care assisted living facility. . Patient and wife will work with LCSW to coordinate in-home care services versus long-term memory care assisted living placement.     . Patient and wife will work with LCSW to address  concerns related to recommended level of care (I.e. in-home care services versus long-term memory care assisted living placement). . Patient will experience decrease in ED visits as evidenced by wife's report and review of electronic medical record. . Patient will attend all scheduled medical appointments as evidenced by wife's report, and care team review of appointment completion in electronic medical record. Interventions : . Reviewed community support/in-home care/adult day care options:   PCS (Personal Care Services), Aid and Attendance [facility]), ACE ([place] [facility]), PACE ([facility] [place] [facility]) and private pay. . Other interventions provided: Solution-Focused Strategies, Problem Solving, Teaching/Coaching Strategies, Education, and Brief Caregiver Counseling and Support.  . Collaboration with [name], DO regarding development and update of comprehensive plan of care as evidenced by provider attestation and co-signature. Bertram Savin care team collaboration (see longitudinal plan of care). . Collaboration with Shelva Majestic, Cardiologist regarding whether or not patient should be wearing [facility] or Stockings for Edema and Swelling. . Collaboration with [name], DO, regarding recommendation for patient to receive home health physical therapy, occupational therapy and nursing services, through a home health agency of choice, by requesting an order for services. Patient Goals: . Patient's wife, Warrick Llera has now decided that she would like to place patient into a memory care assisted living facility, directly after discharge from the hospital, due to patient's Worsening Dementia with Visual Hallucinations, Acute Delirium and Behavioral Disturbances. . Patient now requires 24 hour care and supervision and currently has a 24 hour sitter in place for safety. . Inpatient hospital transition of care team members are currently working on pursing long-term care assisted living placement for patient into a memory care facility that offers the wander-guard system. Marland Kitchen FL-2 Form was faxed to memory care assisted living facilities on 01/14/2021. . Inpatient hospital transition of care team members will receive offers and provide to wife, continuing to assist with discharge planning arrangements. Follow-Up:  No Follow-Up Required.    Follow Up Plan:  No Follow-Up Required.  Nat Christen, BSW, MSW, LCSW  Licensed  Education officer, [facility]  [address] [place], [place] [address] Physical Address-[address] [place], [place] [address] Toll Free Main # [phone] Fax # [phone] Cell # [phone]  [email or web address]

## 2021-01-16 NOTE — Care Management Important Message (Signed)
Medicare IM printed remotely for Social Work team to give to the patient.

## 2021-01-17 ENCOUNTER — Ambulatory Visit: Payer: Medicare HMO | Admitting: Physician Assistant

## 2021-01-17 ENCOUNTER — Ambulatory Visit: Payer: Medicare HMO | Admitting: Neurology

## 2021-01-17 DIAGNOSIS — G9341 Metabolic encephalopathy: Secondary | ICD-10-CM | POA: Diagnosis not present

## 2021-01-17 LAB — RESP PANEL BY RT-PCR (FLU A&B, COVID) ARPGX2
Influenza A by PCR: NEGATIVE
Influenza B by PCR: NEGATIVE
SARS Coronavirus 2 by RT PCR: NEGATIVE

## 2021-01-17 LAB — METHYLMALONIC ACID, SERUM: Methylmalonic Acid, Quantitative: 98 nmol/L (ref 0–378)

## 2021-01-17 MED ORDER — POLYETHYLENE GLYCOL 3350 17 G PO PACK
17.0000 g | PACK | Freq: Every day | ORAL | Status: DC
  Administered 2021-01-18: 17 g via ORAL
  Filled 2021-01-17: qty 1

## 2021-01-17 MED ORDER — POLYETHYLENE GLYCOL 3350 17 G PO PACK: 17.0000 g | PACK | Freq: Every day | ORAL | 0 refills | Status: DC

## 2021-01-17 MED ORDER — DOCUSATE SODIUM 100 MG PO CAPS: 100.0000 mg | ORAL_CAPSULE | Freq: Two times a day (BID) | ORAL | 2 refills | Status: DC

## 2021-01-17 MED ORDER — FUROSEMIDE 40 MG PO TABS: 40.0000 mg | ORAL_TABLET | ORAL | 0 refills | Status: DC

## 2021-01-17 MED ORDER — LACTULOSE 10 GM/15ML PO SOLN
20.0000 g | Freq: Two times a day (BID) | ORAL | Status: DC
  Administered 2021-01-17 – 2021-01-18 (×2): 20 g via ORAL
  Filled 2021-01-17 (×2): qty 30

## 2021-01-17 NOTE — Progress Notes (Signed)
Physical Therapy Treatment Patient Details Name: [name] LORTIE Sr. MRN: [id number] DOB: 16-May-1951 Today's Date: 01/17/2021    History of Present Illness 70 year old male brought to ER due to worsening bilateral edema, poor p.o. intake and worsening hallucination.  Thought to be having community-acquired pneumonia. Past medical history of paroxysmal A. fib, dementia, depression, anxiety, hypothyroidism. Currently plan is arranging safe discharge plan    PT Comments    Pt in bed striping off his gown.  General Comments: AxO X ! Hx Dementia. Follows commands inconsisently. Assisted OOB required + 2 assist this session.  General bed mobility comments: assist with upper body present with posterior lean (BMI girth) and assist with B LE back to bed with repeat simple functional commands.  General transfer comment: Unsteady. Assist to power up, steady, control descent. Cues for safety, technique. Assisted with toilet transfer with difficulty completing turns and freezing.General Gait Details: VERY unsteady gait with initial shuffled short steps and tendency to push walker too far to front.  75% VC's to take "big steps" as pt freezes, festinates and upper body preceeds lower.  HIGH FALL RISK. Pt would benefit from a Neurology consult to address his ataxia, festinating and freezing.  Granted he does have a Hx dementia but he is also presenting motor control deficits.   Follow Up Recommendations  SNF     Equipment Recommendations  None recommended by PT    Recommendations for Other Services       Precautions / Restrictions Precautions Precautions: Fall Precaution Comments: Hx dementia    Mobility  Bed Mobility Overal bed mobility: Needs Assistance Bed Mobility: Supine to Sit           General bed mobility comments: assist with upper body present with posterior lean (BMI girth) and assist with B LE back to bed with repeat simple functional commands    Transfers Overall transfer level:  Needs assistance Equipment used: None;Rolling walker (2 wheeled) Transfers: Sit to/from [facility] Sit to Stand: Min assist Stand pivot transfers: Min assist;Mod assist       General transfer comment: Unsteady. Assist to power up, steady, control descent. Cues for safety, technique. Assisted with toilet transfer with difficulty completing turns and freezing.  Ambulation/Gait Ambulation/Gait assistance: Min assist;+2 physical assistance;+2 safety/equipment Gait Distance (Feet): 40 Feet Assistive device: Rolling walker (2 wheeled) Gait Pattern/deviations: Shuffle;Trunk flexed;Ataxic;Festinating Gait velocity: decreased   General Gait Details: VERY unsteady gait with initial shuffled short steps and tendency to push walker too far to front.  75% VC's to take "big steps" as pt freezes, festinates and upper body preceeds lower.  HIGH FALL RISK.   Stairs             Wheelchair Mobility    Modified Rankin (Stroke Patients Only)       Balance                                            Cognition Arousal/Alertness: Awake/alert Behavior During Therapy: WFL for tasks assessed/performed Overall Cognitive Status: No family/caregiver present to determine baseline cognitive functioning                                 General Comments: AxO X ! Hx Dementia. Follows commands inconsisently.      Exercises  General Comments        Pertinent Vitals/Pain Pain Assessment: 0-10    Home Living                      Prior Function            PT Goals (current goals can now be found in the care plan section) Progress towards PT goals: Progressing toward goals    Frequency    Min 2X/week      PT Plan Current plan remains appropriate    Co-evaluation              AM-PAC PT "6 Clicks" Mobility   Outcome Measure  Help needed turning from your back to your side while in a flat bed without using  bedrails?: A Lot Help needed moving from lying on your back to sitting on the side of a flat bed without using bedrails?: A Lot Help needed moving to and from a bed to a chair (including a wheelchair)?: A Lot Help needed standing up from a chair using your arms (e.g., wheelchair or bedside chair)?: A Lot Help needed to walk in hospital room?: A Lot Help needed climbing 3-5 steps with a railing? : Total 6 Click Score: 11    End of Session Equipment Utilized During Treatment: Gait belt Activity Tolerance: Patient tolerated treatment well Patient left: in bed;with call bell/phone within reach;with bed alarm set Nurse Communication: Mobility status PT Visit Diagnosis: History of falling (Z91.81);Unsteadiness on feet (R26.81);Muscle weakness (generalized) (M62.81);Difficulty in walking, not elsewhere classified (R26.2)     Time: 6803-2122 PT Time Calculation (min) (ACUTE ONLY): 36 min  Charges:  $Gait Training: 8-22 mins $Therapeutic Activity: 8-22 mins                     Rica Koyanagi  PTA Acute  Rehabilitation Services Pager      [phone] Office      [phone]

## 2021-01-17 NOTE — Discharge Summary (Signed)
[facility] Discharge Summary   Patient: [name] PPI:[id number]  PCP: [name], DO  Date of admission: 01/10/2021   Date of discharge:  01/17/2021     Discharge Diagnoses:  Principal Problem:   Acute metabolic encephalopathy Active Problems:   Chronic anticoagulation   Hypothyroidism   Benign prostatic hyperplasia   CAP (community acquired pneumonia)   AF (paroxysmal atrial fibrillation) ([place])  Admitted From: home Disposition:   SNF  Recommendations for Outpatient Follow-up:  1. PCP: Follow-up with PCP in 1 to 2 weeks 2. Continue to engage with family regarding goals of care with palliative care referral 3. Follow up LABS/TEST: None 4. New Meds: Colace and MiraLAX 5. Changed meds: None 6. Stopped meds: None   Follow-up Information    [name], [name], DO. Schedule an appointment as soon as possible for a visit in 1 week(s).   Specialty: Family Medicine Contact information: [address] [place] [address] [phone]        Palliative care Follow up.   Why: Refer to palliative care at the facility.             Discharge Instructions    Increase activity slowly   Complete by: As directed      Diet recommendation: Mechanical soft diet.  Activity: The patient is advised to gradually reintroduce usual activities, as tolerated  Discharge Condition: stable  Code Status: DNR   History of present illness: As per the H and P dictated on admission, "[name] PARCELL Sr. is a 70 y.o. male with medical history significant for dementia with baseline visual hallucinations, hypertension, hyperlipidemia, acquired hypothyroidism, paroxysmal atrial fibrillation chronically anticoagulated on Eliquis, who is admitted to [facility] on 01/10/2021 with suspected acute metabolic encephalopathy after presenting from home to [facility] ED for evaluation of worsening visual hallucinations."  Hospital Course:  Summary of his active problems in the hospital is as  following.   1.  Worsening visual hallucination. Dementia with acute delirium, behavioral disturbances Depression I do not think the patient has acute metabolic encephalopathy. Patient appears to have worsening of his chronic dementia. Recent UTI and medication change with Bactrim and trazodone may have contributed some. Currently we will resume his home medication which includes Depakote 750 mg every 12 hours, Cymbalta 60 mg daily, Zyprexa 5 mg in the morning and 15 mg nightly and trazodone 50 mg nightly. Normal Depakote level. LFTs were fine. Ammonia level normal.  2.  Concern for community-acquired pneumonia-ruled out. Recent UTI-treated with Bactrim Does not appear to be hypoxic. CT chest shows possibility of atypical pneumonia although procalcitonin level is negative. Antibiotic has been discontinued since admission and currently patient remains stable without any infection.  3.  Bilateral pedal edema Etiology not clear.  Likely acute on chronic diastolic CHF in the setting of A. fib. Chest x-ray shows possible vascular congestion. Echocardiogram 2020 EF 65%. Good response to 1 dose of IV Lasix, 40 mg twice weekly Lasix on discharge.  4.  BPH Continue Flomax.  5.  Paroxysmal A. fib HTN Currently rate controlled. Continue anticoagulation. Not on any medication for rate control.  Monitor.  6.  Ulcerative colitis On chronic Imuran.  Continue. Reportedly patient had diarrhea in the past with his colitis.  7.  B12 deficiency. Continue supplementation.  8.  Constipation.  Resolved. Likely contributing to confusion. Continue bowel regimen.  9. Hypothyroidism Continue Synthroid.  10.  Obesity. Placing the patient at high risk for poor outcome Also could  have undiagnosed OSA.  Therefore will need to be careful regarding psychotropic medication. Body mass index is 34.53 kg/m.   On the day of the discharge the patient's vitals were stable, and no other acute  medical condition were reported by patient. The patient was felt safe to be discharge at SNF with Therapy.  Consultants: None Procedures: None  DISCHARGE MEDICATION: Allergies as of 01/17/2021      Reactions   Losartan Potassium-hctz Diarrhea   Nsaids    On eliquis.    Zoloft [sertraline Hcl] Diarrhea   Benzodiazepines Other (See Comments)   Agitation.      Medication List    STOP taking these medications   sulfamethoxazole-trimethoprim 800-160 MG tablet Commonly known as: BACTRIM DS     TAKE these medications   acetaminophen 500 MG tablet Commonly known as: TYLENOL Take 500 mg by mouth every 6 (six) hours as needed for moderate pain.   apixaban 5 MG Tabs tablet Commonly known as: ELIQUIS Take 1 tablet (5 mg total) by mouth 2 (two) times daily.   atorvastatin 20 MG tablet Commonly known as: LIPITOR Take 1 tablet (20 mg total) by mouth daily.   azaTHIOprine 50 MG tablet Commonly known as: IMURAN Take 200 mg by mouth every morning.   divalproex 125 MG capsule Commonly known as: DEPAKOTE SPRINKLE Take 6 capsules (750 mg total) by mouth every 12 (twelve) hours.   docusate sodium 100 MG capsule Commonly known as: Colace Take 1 capsule (100 mg total) by mouth 2 (two) times daily.   DULoxetine 60 MG capsule Commonly known as: CYMBALTA Take 1 capsule (60 mg total) by mouth daily.   furosemide 40 MG tablet Commonly known as: LASIX Take 1 tablet (40 mg total) by mouth 2 (two) times a week. Start taking on: Jan 19, 2021   levocetirizine 5 MG tablet Commonly known as: XYZAL Take 5 mg by mouth every evening.   levothyroxine 50 MCG tablet Commonly known as: Synthroid Take 1 tablet (50 mcg total) by mouth daily before breakfast.   Melatonin 10 MG Tabs Take 10 mg by mouth daily.   OLANZapine 10 MG tablet Commonly known as: ZYPREXA 1/2 tab in the morning and 1.5 tabs QHS What changed:   how much to take  how to take this  when to take this   polyethylene  glycol 17 g packet Commonly known as: MiraLax Take 17 g by mouth daily.   potassium chloride 10 MEQ tablet Commonly known as: Klor-Con M10 Take 1 tablet (10 mEq total) by mouth daily.   tamsulosin 0.4 MG Caps capsule Commonly known as: FLOMAX Take 1 capsule (0.4 mg total) by mouth daily.   traZODone 50 MG tablet Commonly known as: DESYREL TAKE 1 TABLET BY MOUTH EVERYDAY AT BEDTIME What changed: See the new instructions.   vitamin D3 50 MCG (2000 UT) Caps Take 2,000 Units by mouth daily.   vitamin E 1000 UNIT capsule Take 1,000 Units by mouth daily.       Discharge Exam: Filed Weights   01/15/21 0500 01/16/21 0500 01/17/21 0451  Weight: 100.2 kg 101 kg 100 kg   Vitals:   01/16/21 2038 01/17/21 0452  BP: (!) 136/110 (!) 117/95  Pulse: 69 82  Resp: 18 19  Temp: 98.4 F (36.9 C) 97.8 F (36.6 C)  SpO2: 96% 96%   General: Appear in mild distress, no Rash; Oral Mucosa Clear, moist. no Abnormal Neck Mass Or lumps, Conjunctiva normal  Cardiovascular: S1 and S2 Present, no  Murmur, Respiratory: good respiratory effort, Bilateral Air entry present and CTA, no Crackles, no wheezes Abdomen: Bowel Sound present, Soft and no tenderness Extremities: Bilateral pedal edema Neurology: alert and not oriented to time, place, and person affect appropriate. no new focal deficit Gait not checked due to patient safety concerns  The results of significant diagnostics from this hospitalization (including imaging, microbiology, ancillary and laboratory) are listed below for reference.    Significant Diagnostic Studies: CT Head Wo Contrast  Result Date: 01/10/2021 CLINICAL DATA:  Altered mental status. Weakness. History of dementia. EXAM: CT HEAD WITHOUT CONTRAST TECHNIQUE: Contiguous axial images were obtained from the base of the skull through the vertex without intravenous contrast. COMPARISON:  CT head 12/13/2020 FINDINGS: Brain: The brainstem, cerebellum, cerebral peduncles, thalami,  basal ganglia, basilar cisterns, and ventricular system appear within normal limits. Stable cerebral atrophy advanced for age. Periventricular white matter and corona radiata hypodensities favor chronic ischemic microvascular white matter disease. No intracranial hemorrhage, mass lesion, or acute CVA. Vascular: There is atherosclerotic calcification of the cavernous carotid arteries bilaterally. Skull: Unremarkable Sinuses/Orbits: Unremarkable Other: No supplemental non-categorized findings. IMPRESSION: 1. No acute findings. 2. Stable cerebral atrophy and evidence of chronic ischemic microvascular white matter disease. 3. Atherosclerosis. Electronically Signed   By: Van Clines M.D.   On: 01/10/2021 14:48   CT Angio Chest PE W and/or Wo Contrast  Result Date: 01/10/2021 CLINICAL DATA:  Abdominal distension.  Lethargy.  Hematuria.  Cough. EXAM: CT ANGIOGRAPHY CHEST CT ABDOMEN AND PELVIS WITH CONTRAST TECHNIQUE: Multidetector CT imaging of the chest was performed using the standard protocol during bolus administration of intravenous contrast. Multiplanar CT image reconstructions and MIPs were obtained to evaluate the vascular anatomy. Multidetector CT imaging of the abdomen and pelvis was performed using the standard protocol during bolus administration of intravenous contrast. CONTRAST:  161m OMNIPAQUE IOHEXOL 350 MG/ML SOLN COMPARISON:  Multiple exams, including chest radiograph 01/10/2021 and CT abdomen 02/17/2019 FINDINGS: Despite efforts by the technologist and patient, motion artifact is present on today's exam and could not be eliminated. Arm positioning by the patient's size also lower signal to noise ratio. These factors reduces exam sensitivity and specificity. CTA CHEST FINDINGS Cardiovascular: No filling defect is identified in the pulmonary arterial tree to suggest pulmonary embolus. Coronary, aortic arch, and branch vessel atherosclerotic vascular disease. Moderate cardiomegaly. Calcifications  of the aortic and mitral valve noted. Mediastinum/Nodes: Mild prominence of mediastinal adipose tissue. No pathologic adenopathy identified. Lungs/Pleura: Patchy ground-glass density/mosaic attenuation in the lungs potentially from edema, extrinsic allergic alveolitis, atypical pneumonia, or bronchiolitis obliterans. No overt consolidation. Musculoskeletal: Right distal clavicular resection in the past. 1.6 by 1.0 cm sclerotic lesion of the left ninth rib, not significantly changed from 02/17/2019. Thoracic spondylosis. Review of the MIP images confirms the above findings. CT ABDOMEN and PELVIS FINDINGS Hepatobiliary: Unremarkable Pancreas: Unremarkable Spleen: Unremarkable Adrenals/Urinary Tract: Nonspecific 0.9 cm hypodense lesion of the right mid kidney posteriorly on image 32 series 9. Not appreciably changed from 2020. Nonspecific 5 mm lesion of the left kidney lower pole on image 39 of series 9, slightly more conspicuous than on 02/17/2019, probably benign but technically nonspecific. Adrenal glands in urinary bladder unremarkable. Stomach/Bowel: Several oblong densities in the colon may represent high-density pill fragments. Prominent stool throughout the colon favors constipation. No dilated small bowel. Normal appendix. Vascular/Lymphatic: Aortoiliac atherosclerotic vascular disease. Reproductive: Unremarkable Other: No supplemental non-categorized findings. Musculoskeletal: Grade 1 degenerative anterolisthesis of L4 on L5. Lumbar spondylosis and degenerative disc disease causing multilevel impingement. Review  of the MIP images confirms the above findings. IMPRESSION: 1. No filling defect is identified in the pulmonary arterial tree to suggest pulmonary embolus. 2. Prominent stool throughout the colon favors constipation. 3. Cardiomegaly with hazy patchy ground-glass density/mosaic attenuation in the lung, possibilities including mild pulmonary edema, extrinsic allergic alveolitis, atypical pneumonia, or  bronchiolitis obliterans. 4. Other imaging findings of potential clinical significance: Aortic Atherosclerosis (ICD10-I70.0). Coronary atherosclerosis. Mitral and aortic valve calcifications. Chronically stable left ninth rib sclerotic lesion, likely benign. Multilevel lumbar impingement. Electronically Signed   By: Van Clines M.D.   On: 01/10/2021 18:27   CT ABDOMEN PELVIS W CONTRAST  Result Date: 01/10/2021 CLINICAL DATA:  Abdominal distension.  Lethargy.  Hematuria.  Cough. EXAM: CT ANGIOGRAPHY CHEST CT ABDOMEN AND PELVIS WITH CONTRAST TECHNIQUE: Multidetector CT imaging of the chest was performed using the standard protocol during bolus administration of intravenous contrast. Multiplanar CT image reconstructions and MIPs were obtained to evaluate the vascular anatomy. Multidetector CT imaging of the abdomen and pelvis was performed using the standard protocol during bolus administration of intravenous contrast. CONTRAST:  133m OMNIPAQUE IOHEXOL 350 MG/ML SOLN COMPARISON:  Multiple exams, including chest radiograph 01/10/2021 and CT abdomen 02/17/2019 FINDINGS: Despite efforts by the technologist and patient, motion artifact is present on today's exam and could not be eliminated. Arm positioning by the patient's size also lower signal to noise ratio. These factors reduces exam sensitivity and specificity. CTA CHEST FINDINGS Cardiovascular: No filling defect is identified in the pulmonary arterial tree to suggest pulmonary embolus. Coronary, aortic arch, and branch vessel atherosclerotic vascular disease. Moderate cardiomegaly. Calcifications of the aortic and mitral valve noted. Mediastinum/Nodes: Mild prominence of mediastinal adipose tissue. No pathologic adenopathy identified. Lungs/Pleura: Patchy ground-glass density/mosaic attenuation in the lungs potentially from edema, extrinsic allergic alveolitis, atypical pneumonia, or bronchiolitis obliterans. No overt consolidation. Musculoskeletal: Right  distal clavicular resection in the past. 1.6 by 1.0 cm sclerotic lesion of the left ninth rib, not significantly changed from 02/17/2019. Thoracic spondylosis. Review of the MIP images confirms the above findings. CT ABDOMEN and PELVIS FINDINGS Hepatobiliary: Unremarkable Pancreas: Unremarkable Spleen: Unremarkable Adrenals/Urinary Tract: Nonspecific 0.9 cm hypodense lesion of the right mid kidney posteriorly on image 32 series 9. Not appreciably changed from 2020. Nonspecific 5 mm lesion of the left kidney lower pole on image 39 of series 9, slightly more conspicuous than on 02/17/2019, probably benign but technically nonspecific. Adrenal glands in urinary bladder unremarkable. Stomach/Bowel: Several oblong densities in the colon may represent high-density pill fragments. Prominent stool throughout the colon favors constipation. No dilated small bowel. Normal appendix. Vascular/Lymphatic: Aortoiliac atherosclerotic vascular disease. Reproductive: Unremarkable Other: No supplemental non-categorized findings. Musculoskeletal: Grade 1 degenerative anterolisthesis of L4 on L5. Lumbar spondylosis and degenerative disc disease causing multilevel impingement. Review of the MIP images confirms the above findings. IMPRESSION: 1. No filling defect is identified in the pulmonary arterial tree to suggest pulmonary embolus. 2. Prominent stool throughout the colon favors constipation. 3. Cardiomegaly with hazy patchy ground-glass density/mosaic attenuation in the lung, possibilities including mild pulmonary edema, extrinsic allergic alveolitis, atypical pneumonia, or bronchiolitis obliterans. 4. Other imaging findings of potential clinical significance: Aortic Atherosclerosis (ICD10-I70.0). Coronary atherosclerosis. Mitral and aortic valve calcifications. Chronically stable left ninth rib sclerotic lesion, likely benign. Multilevel lumbar impingement. Electronically Signed   By: WVan ClinesM.D.   On: 01/10/2021 18:27    DG Chest Port 1 View  Result Date: 01/10/2021 CLINICAL DATA:  Generalized weakness, possible pneumonia EXAM: PORTABLE CHEST 1 VIEW COMPARISON:  07/30/2020  FINDINGS: Low lung volumes. Increased interstitial prominence. No significant pleural effusion. No pneumothorax. Similar cardiomediastinal contours with cardiomegaly. IMPRESSION: Low lung volumes with increased interstitial prominence. Could reflect edema (cardiomegaly is present) or atypical/viral pneumonia in the appropriate setting. Electronically Signed   By: Macy Mis M.D.   On: 01/10/2021 13:33    Microbiology: Recent Results (from the past 240 hour(s))  Resp Panel by RT-PCR (Flu A&B, Covid) Nasopharyngeal Swab     Status: None   Collection Time: 01/10/21 12:54 PM   Specimen: Nasopharyngeal Swab; Nasopharyngeal(NP) swabs in vial transport medium  Result Value Ref Range Status   SARS Coronavirus 2 by RT PCR NEGATIVE NEGATIVE Final    Comment: (NOTE) SARS-CoV-2 target nucleic acids are NOT DETECTED.  The SARS-CoV-2 RNA is generally detectable in upper respiratory specimens during the acute phase of infection. The lowest concentration of SARS-CoV-2 viral copies this assay can detect is 138 copies/mL. A negative result does not preclude SARS-Cov-2 infection and should not be used as the sole basis for treatment or other patient management decisions. A negative result may occur with  improper specimen collection/handling, submission of specimen other than nasopharyngeal swab, presence of viral mutation(s) within the areas targeted by this assay, and inadequate number of viral copies(<138 copies/mL). A negative result must be combined with clinical observations, patient history, and epidemiological information. The expected result is Negative.  Fact Sheet for Patients:  EntrepreneurPulse.com.au  Fact Sheet for Healthcare Providers:  IncredibleEmployment.be  This test is no t yet approved  or cleared by the [place] FDA and  has been authorized for detection and/or diagnosis of SARS-CoV-2 by FDA under an Emergency Use Authorization (EUA). This EUA will remain  in effect (meaning this test can be used) for the duration of the COVID-19 declaration under Section 564(b)(1) of the Act, 21 U.S.C.section 360bbb-3(b)(1), unless the authorization is terminated  or revoked sooner.       Influenza A by PCR NEGATIVE NEGATIVE Final   Influenza B by PCR NEGATIVE NEGATIVE Final    Comment: (NOTE) The Xpert Xpress SARS-CoV-2/FLU/RSV plus assay is intended as an aid in the diagnosis of influenza from Nasopharyngeal swab specimens and should not be used as a sole basis for treatment. Nasal washings and aspirates are unacceptable for Xpert Xpress SARS-CoV-2/FLU/RSV testing.  Fact Sheet for Patients: EntrepreneurPulse.com.au  Fact Sheet for Healthcare Providers: IncredibleEmployment.be  This test is not yet approved or cleared by the [place] FDA and has been authorized for detection and/or diagnosis of SARS-CoV-2 by FDA under an Emergency Use Authorization (EUA). This EUA will remain in effect (meaning this test can be used) for the duration of the COVID-19 declaration under Section 564(b)(1) of the Act, 21 U.S.C. section 360bbb-3(b)(1), unless the authorization is terminated or revoked.  Performed at [facility], [place] [address] [place], [place] [address]   Culture, blood (Routine X 2) w Reflex to ID Panel     Status: None   Collection Time: 01/11/21  1:13 AM   Specimen: BLOOD  Result Value Ref Range Status   Specimen Description   Final    BLOOD SPECIMEN SOURCE NOT MARKED ON REQUISITION Performed at [place] [address], [place], [place] [address]    Special Requests   Final    BOTTLES DRAWN AEROBIC AND ANAEROBIC Blood Culture adequate volume Performed at [place] [address] [place], [place] [address]    Culture   Final    NO GROWTH  5 DAYS Performed at [facility], [place] [address], [place], [place] [address]    Report Status 01/16/2021 FINAL  Final  Culture, blood (Routine X 2) w Reflex to ID Panel     Status: None   Collection Time: 01/11/21  1:13 AM   Specimen: BLOOD  Result Value Ref Range Status   Specimen Description   Final    BLOOD SPECIMEN SOURCE NOT MARKED ON REQUISITION Performed at [place] [address], [place], [place] [address]    Special Requests   Final    BOTTLES DRAWN AEROBIC AND ANAEROBIC Blood Culture adequate volume Performed at [place] [address], [place], [place] [address]    Culture   Final    NO GROWTH 5 DAYS Performed at [facility], [place] [address] [place], [place] [address]    Report Status 01/16/2021 FINAL  Final  Resp Panel by RT-PCR (Flu A&B, Covid) Nasopharyngeal Swab     Status: None   Collection Time: 01/17/21 11:11 AM   Specimen: Nasopharyngeal Swab; Nasopharyngeal(NP) swabs in vial transport medium  Result Value Ref Range Status   SARS Coronavirus 2 by RT PCR NEGATIVE NEGATIVE Final    Comment: (NOTE) SARS-CoV-2 target nucleic acids are NOT DETECTED.  The SARS-CoV-2 RNA is generally detectable in upper respiratory specimens during the acute phase of infection. The lowest concentration of SARS-CoV-2 viral copies this assay can detect is 138 copies/mL. A negative result does not preclude SARS-Cov-2 infection and should not be used as the sole basis for treatment or other patient management decisions. A negative result may occur with  improper specimen collection/handling, submission of specimen other than nasopharyngeal swab, presence of viral mutation(s) within the areas targeted by this assay, and inadequate number of viral copies(<138 copies/mL). A negative result must be combined with clinical observations, patient  history, and epidemiological information. The expected result is Negative.  Fact Sheet for Patients:  EntrepreneurPulse.com.au  Fact Sheet for Healthcare Providers:  IncredibleEmployment.be  This test is no t yet approved or cleared by the [place] FDA and  has been authorized for detection and/or diagnosis of SARS-CoV-2 by FDA under an Emergency Use Authorization (EUA). This EUA will remain  in effect (meaning this test can be used) for the duration of the COVID-19 declaration under Section 564(b)(1) of the Act, 21 U.S.C.section 360bbb-3(b)(1), unless the authorization is terminated  or revoked sooner.       Influenza A by PCR NEGATIVE NEGATIVE Final   Influenza B by PCR NEGATIVE NEGATIVE Final    Comment: (NOTE) The Xpert Xpress SARS-CoV-2/FLU/RSV plus assay is intended as an aid in the diagnosis of influenza from Nasopharyngeal swab specimens and should not be used as a sole basis for treatment. Nasal washings and aspirates are unacceptable for Xpert Xpress SARS-CoV-2/FLU/RSV testing.  Fact Sheet for Patients: EntrepreneurPulse.com.au  Fact Sheet for Healthcare Providers: IncredibleEmployment.be  This test is not yet approved or cleared by the [place] FDA and has been authorized for detection and/or diagnosis of SARS-CoV-2 by FDA under an Emergency Use Authorization (EUA). This EUA will remain in effect (meaning this test can be used) for the duration of the COVID-19 declaration under Section 564(b)(1) of the Act, 21 U.S.C. section 360bbb-3(b)(1), unless the authorization is terminated or revoked.  Performed at [facility], [place] [address] [place], [place] [address]      Labs: CBC: Recent Labs  Lab 01/11/21 0113  WBC 5.1  NEUTROABS  3.9  HGB 14.0  HCT 40.3  MCV 103.6*  PLT 412*   Basic Metabolic Panel: Recent Labs  Lab 01/10/21 2220 01/11/21 0113  01/12/21 1431  NA  --  136 135  K  --  3.9 4.2  CL  --  102 98  CO2  --  23 28  GLUCOSE  --  88 102*  BUN  --  9 14  CREATININE  --  1.06 1.12  CALCIUM  --  9.1 9.0  MG 1.9 1.8 2.0  PHOS  --  3.6  --    Liver Function Tests: Recent Labs  Lab 01/11/21 0113  AST 28  ALT 11  ALKPHOS 32*  BILITOT 1.3*  PROT 6.1*  ALBUMIN 3.5   CBG: Recent Labs  Lab 01/13/21 8208  HNGITJ 95    Time spent: 35 minutes  Signed:  Berle Mull  [facility]  01/17/2021 3:03 PM

## 2021-01-18 DIAGNOSIS — Z7901 Long term (current) use of anticoagulants: Secondary | ICD-10-CM | POA: Diagnosis not present

## 2021-01-18 DIAGNOSIS — I10 Essential (primary) hypertension: Secondary | ICD-10-CM | POA: Diagnosis not present

## 2021-01-18 DIAGNOSIS — G9341 Metabolic encephalopathy: Secondary | ICD-10-CM | POA: Diagnosis not present

## 2021-01-18 DIAGNOSIS — E039 Hypothyroidism, unspecified: Secondary | ICD-10-CM | POA: Diagnosis not present

## 2021-01-18 DIAGNOSIS — I5032 Chronic diastolic (congestive) heart failure: Secondary | ICD-10-CM | POA: Diagnosis not present

## 2021-01-18 DIAGNOSIS — F0281 Dementia in other diseases classified elsewhere with behavioral disturbance: Secondary | ICD-10-CM | POA: Diagnosis not present

## 2021-01-18 DIAGNOSIS — F339 Major depressive disorder, recurrent, unspecified: Secondary | ICD-10-CM | POA: Diagnosis not present

## 2021-01-18 DIAGNOSIS — Z7401 Bed confinement status: Secondary | ICD-10-CM | POA: Diagnosis not present

## 2021-01-18 DIAGNOSIS — N400 Enlarged prostate without lower urinary tract symptoms: Secondary | ICD-10-CM | POA: Diagnosis not present

## 2021-01-18 DIAGNOSIS — R404 Transient alteration of awareness: Secondary | ICD-10-CM | POA: Diagnosis not present

## 2021-01-18 DIAGNOSIS — D519 Vitamin B12 deficiency anemia, unspecified: Secondary | ICD-10-CM | POA: Diagnosis not present

## 2021-01-18 DIAGNOSIS — I480 Paroxysmal atrial fibrillation: Secondary | ICD-10-CM | POA: Diagnosis not present

## 2021-01-18 DIAGNOSIS — W19XXXA Unspecified fall, initial encounter: Secondary | ICD-10-CM | POA: Diagnosis not present

## 2021-01-18 DIAGNOSIS — M6281 Muscle weakness (generalized): Secondary | ICD-10-CM | POA: Diagnosis not present

## 2021-01-18 DIAGNOSIS — R4182 Altered mental status, unspecified: Secondary | ICD-10-CM | POA: Diagnosis not present

## 2021-01-18 DIAGNOSIS — F0390 Unspecified dementia without behavioral disturbance: Secondary | ICD-10-CM | POA: Diagnosis not present

## 2021-01-18 DIAGNOSIS — R600 Localized edema: Secondary | ICD-10-CM | POA: Diagnosis not present

## 2021-01-18 DIAGNOSIS — G309 Alzheimer's disease, unspecified: Secondary | ICD-10-CM | POA: Diagnosis not present

## 2021-01-18 DIAGNOSIS — R2681 Unsteadiness on feet: Secondary | ICD-10-CM | POA: Diagnosis not present

## 2021-01-18 DIAGNOSIS — E1159 Type 2 diabetes mellitus with other circulatory complications: Secondary | ICD-10-CM | POA: Diagnosis not present

## 2021-01-18 DIAGNOSIS — M2550 Pain in unspecified joint: Secondary | ICD-10-CM | POA: Diagnosis not present

## 2021-01-18 DIAGNOSIS — K5100 Ulcerative (chronic) pancolitis without complications: Secondary | ICD-10-CM | POA: Diagnosis not present

## 2021-01-18 NOTE — Plan of Care (Signed)
  Problem: Clinical Measurements: Goal: Ability to maintain clinical measurements within normal limits will improve 01/18/2021 1823 by Zadie Rhine, RN Outcome: Adequate for Discharge 01/18/2021 1718 by Zadie Rhine, RN Outcome: Adequate for Discharge 01/18/2021 1718 by Zadie Rhine, RN Outcome: Adequate for Discharge Goal: Will remain free from infection 01/18/2021 1823 by Zadie Rhine, RN Outcome: Adequate for Discharge 01/18/2021 1718 by Zadie Rhine, RN Outcome: Adequate for Discharge 01/18/2021 1718 by Zadie Rhine, RN Outcome: Adequate for Discharge Goal: Diagnostic test results will improve 01/18/2021 1823 by Zadie Rhine, RN Outcome: Adequate for Discharge 01/18/2021 1718 by Zadie Rhine, RN Outcome: Adequate for Discharge 01/18/2021 1718 by Zadie Rhine, RN Outcome: Adequate for Discharge Goal: Respiratory complications will improve 01/18/2021 1823 by Zadie Rhine, RN Outcome: Adequate for Discharge 01/18/2021 1718 by Zadie Rhine, RN Outcome: Adequate for Discharge 01/18/2021 1718 by Zadie Rhine, RN Outcome: Adequate for Discharge Goal: Cardiovascular complication will be avoided 01/18/2021 1823 by Zadie Rhine, RN Outcome: Adequate for Discharge 01/18/2021 1718 by Zadie Rhine, RN Outcome: Adequate for Discharge 01/18/2021 1718 by Zadie Rhine, RN Outcome: Adequate for Discharge   Problem: Activity: Goal: Risk for activity intolerance will decrease 01/18/2021 1823 by Zadie Rhine, RN Outcome: Adequate for Discharge 01/18/2021 1718 by Zadie Rhine, RN Outcome: Adequate for Discharge 01/18/2021 1718 by Zadie Rhine, RN Outcome: Adequate for Discharge   Problem: Nutrition: Goal: Adequate nutrition will be maintained 01/18/2021 1823 by Zadie Rhine, RN Outcome: Adequate for Discharge 01/18/2021 1718 by Zadie Rhine, RN Outcome: Adequate for Discharge 01/18/2021 1718 by Zadie Rhine, RN Outcome: Adequate for Discharge   Problem: Coping: Goal: Level of anxiety will decrease 01/18/2021 1823 by Zadie Rhine, RN Outcome: Adequate for Discharge 01/18/2021 1718 by Zadie Rhine, RN Outcome: Adequate for Discharge 01/18/2021 1718 by Zadie Rhine, RN Outcome: Adequate for Discharge   Problem: Elimination: Goal: Will not experience complications related to bowel motility 01/18/2021 1823 by Zadie Rhine, RN Outcome: Adequate for Discharge 01/18/2021 1718 by Zadie Rhine, RN Outcome: Adequate for Discharge 01/18/2021 1718 by Zadie Rhine, RN Outcome: Adequate for Discharge Goal: Will not experience complications related to urinary retention 01/18/2021 1823 by Zadie Rhine, RN Outcome: Adequate for Discharge 01/18/2021 1718 by Zadie Rhine, RN Outcome: Adequate for Discharge 01/18/2021 1718 by Zadie Rhine, RN Outcome: Adequate for Discharge   Problem: Pain Managment: Goal: General experience of comfort will improve 01/18/2021 1823 by Zadie Rhine, RN Outcome: Adequate for Discharge 01/18/2021 1718 by Zadie Rhine, RN Outcome: Adequate for Discharge 01/18/2021 1718 by Zadie Rhine, RN Outcome: Adequate for Discharge   Problem: Safety: Goal: Ability to remain free from injury will improve 01/18/2021 1823 by Zadie Rhine, RN Outcome: Adequate for Discharge 01/18/2021 1718 by Zadie Rhine, RN Outcome: Adequate for Discharge 01/18/2021 1718 by Zadie Rhine, RN Outcome: Adequate for Discharge   Problem: Skin Integrity: Goal: Risk for impaired skin integrity will decrease 01/18/2021 1823 by Zadie Rhine, RN Outcome: Adequate for Discharge 01/18/2021 1718 by Zadie Rhine, RN Outcome: Adequate for Discharge 01/18/2021 1718 by Zadie Rhine, RN Outcome: Adequate for Discharge

## 2021-01-18 NOTE — Progress Notes (Signed)
Occupational Therapy Treatment Patient Details Name: [name] AUBRY Sr. MRN: [id number] DOB: April 27, 1951 Today's Date: 01/18/2021    History of present illness 70 year old male brought to ER due to worsening bilateral edema, poor p.o. intake and worsening hallucination.  Thought to be having community-acquired pneumonia. Past medical history of paroxysmal A. fib, dementia, depression, anxiety, hypothyroidism. Currently plan is arranging safe discharge plan   OT comments  Patient very lethargic today. RN reports patient has not been like this with her on previous shifts and has not received medications to make patient this lethargic. Patient would not initiate washing face semi-supine, after total A x2 to sit upright at edge of bed patient minimally more alert, can wash face with min A. Patient with posterior lean needing up to mod A and constant cues to try and increase arousal and maintain balance however patient unable. Patient returned to semi-supine and RN arrive to try and give patient medications and assist with breakfast. Will continue with POC.   Follow Up Recommendations  SNF    Equipment Recommendations  None recommended by OT       Precautions / Restrictions Precautions Precautions: Fall Precaution Comments: Hx dementia       Mobility Bed Mobility Overal bed mobility: Needs Assistance Bed Mobility: Supine to Sit;Sit to Supine     Supine to sit: +2 for physical assistance;Total assist Sit to supine: Total assist;+2 for physical assistance   General bed mobility comments: max multimodal cues to initiate patient mobility however patient lethargic. once seated at edge of bed patient with posterior lean needing up to mod A to maintain static sitting    Transfers                 General transfer comment: unable/unsafe groggy    Balance Overall balance assessment: Needs assistance Sitting-balance support: Feet supported Sitting balance-Leahy Scale: Poor Sitting  balance - Comments: up to mod A                                   ADL either performed or assessed with clinical judgement   ADL Overall ADL's : Needs assistance/impaired     Grooming: Wash/dry face;Minimal assistance;Sitting Grooming Details (indicate cue type and reason): patient need wash cloth placed in hand would not reach for it seated edge of bed                   Toilet Transfer Details (indicate cue type and reason): unable, strong posterior lean sitting edge of bed and limited arousal                           Cognition Arousal/Alertness: Lethargic Behavior During Therapy: Flat affect Overall Cognitive Status: No family/caregiver present to determine baseline cognitive functioning                                 General Comments: patient lethargic, multiple attempts made with multimodal cues and environmental stimuli however patient unable to keep eyes open for more than few seconds at a time                   Pertinent Vitals/ Pain       Pain Assessment: Faces Faces Pain Scale: No hurt         Frequency  Min  2X/week        Progress Toward Goals  OT Goals(current goals can now be found in the care plan section)  Progress towards OT goals: Not progressing toward goals - comment (lethargic this session)  Acute Rehab OT Goals Patient Stated Goal: pt unable to state OT Goal Formulation: Patient unable to participate in goal setting Time For Goal Achievement: 01/29/21 Potential to Achieve Goals: Good ADL Goals Pt Will Perform Lower Body Dressing: with supervision;sit to/from stand Pt Will Transfer to Toilet: with supervision;regular height toilet;grab bars Pt Will Perform Toileting - Clothing Manipulation and hygiene: with supervision;sit to/from stand Additional ADL Goal #1: Patient will stand at sink to perform grooming task as evidence of improving activity tolerance  Plan Discharge plan remains  appropriate       AM-PAC OT "6 Clicks" Daily Activity     Outcome Measure   Help from another person eating meals?: A Lot Help from another person taking care of personal grooming?: A Lot Help from another person toileting, which includes using toliet, bedpan, or urinal?: Total Help from another person bathing (including washing, rinsing, drying)?: Total Help from another person to put on and taking off regular upper body clothing?: A Lot Help from another person to put on and taking off regular lower body clothing?: Total 6 Click Score: 9    End of Session  OT Visit Diagnosis: Unsteadiness on feet (R26.81);Other symptoms and signs involving cognitive function   Activity Tolerance Patient limited by fatigue;Patient limited by lethargy   Patient Left in bed;with call bell/phone within reach;with bed alarm set;with nursing/sitter in room   Nurse Communication Mobility status;Other (comment) (lethargy)        Time: 8250-0370 OT Time Calculation (min): 13 min  Charges: OT General Charges $OT Visit: 1 Visit OT Treatments $Self Care/Home Management : 8-22 mins  Delbert Phenix OT OT pager: [place] 01/18/2021, 1:03 PM

## 2021-01-18 NOTE — Progress Notes (Signed)
On acute even overnight, wife was at the bedside earlier, she is made aware that patient will benefit from palliative care service at [facility],  Details please see Discharge summary was done By Dr Posey Pronto on 5/10. She is to discharge to snf , recommend palliative care service at snf.

## 2021-01-18 NOTE — Plan of Care (Signed)
  Problem: Clinical Measurements: Goal: Ability to maintain clinical measurements within normal limits will improve Outcome: Adequate for Discharge Goal: Will remain free from infection Outcome: Adequate for Discharge Goal: Diagnostic test results will improve Outcome: Adequate for Discharge Goal: Respiratory complications will improve Outcome: Adequate for Discharge Goal: Cardiovascular complication will be avoided Outcome: Adequate for Discharge   Problem: Activity: Goal: Risk for activity intolerance will decrease Outcome: Adequate for Discharge   Problem: Nutrition: Goal: Adequate nutrition will be maintained Outcome: Adequate for Discharge   Problem: Coping: Goal: Level of anxiety will decrease Outcome: Adequate for Discharge   Problem: Elimination: Goal: Will not experience complications related to bowel motility Outcome: Adequate for Discharge Goal: Will not experience complications related to urinary retention Outcome: Adequate for Discharge   Problem: Pain Managment: Goal: General experience of comfort will improve Outcome: Adequate for Discharge   Problem: Safety: Goal: Ability to remain free from injury will improve Outcome: Adequate for Discharge   Problem: Skin Integrity: Goal: Risk for impaired skin integrity will decrease Outcome: Adequate for Discharge

## 2021-01-18 NOTE — Plan of Care (Signed)
  Problem: Clinical Measurements: Goal: Ability to maintain clinical measurements within normal limits will improve 01/18/2021 1718 by Zadie Rhine, RN Outcome: Adequate for Discharge 01/18/2021 1718 by Zadie Rhine, RN Outcome: Adequate for Discharge Goal: Will remain free from infection 01/18/2021 1718 by Zadie Rhine, RN Outcome: Adequate for Discharge 01/18/2021 1718 by Zadie Rhine, RN Outcome: Adequate for Discharge Goal: Diagnostic test results will improve 01/18/2021 1718 by Zadie Rhine, RN Outcome: Adequate for Discharge 01/18/2021 1718 by Zadie Rhine, RN Outcome: Adequate for Discharge Goal: Respiratory complications will improve 01/18/2021 1718 by Zadie Rhine, RN Outcome: Adequate for Discharge 01/18/2021 1718 by Zadie Rhine, RN Outcome: Adequate for Discharge Goal: Cardiovascular complication will be avoided 01/18/2021 1718 by Zadie Rhine, RN Outcome: Adequate for Discharge 01/18/2021 1718 by Zadie Rhine, RN Outcome: Adequate for Discharge   Problem: Activity: Goal: Risk for activity intolerance will decrease 01/18/2021 1718 by Zadie Rhine, RN Outcome: Adequate for Discharge 01/18/2021 1718 by Zadie Rhine, RN Outcome: Adequate for Discharge   Problem: Nutrition: Goal: Adequate nutrition will be maintained 01/18/2021 1718 by Zadie Rhine, RN Outcome: Adequate for Discharge 01/18/2021 1718 by Zadie Rhine, RN Outcome: Adequate for Discharge   Problem: Coping: Goal: Level of anxiety will decrease 01/18/2021 1718 by Zadie Rhine, RN Outcome: Adequate for Discharge 01/18/2021 1718 by Zadie Rhine, RN Outcome: Adequate for Discharge   Problem: Elimination: Goal: Will not experience complications related to bowel motility 01/18/2021 1718 by Zadie Rhine, RN Outcome: Adequate for Discharge 01/18/2021 1718 by Zadie Rhine, RN Outcome: Adequate for Discharge Goal: Will not  experience complications related to urinary retention 01/18/2021 1718 by Zadie Rhine, RN Outcome: Adequate for Discharge 01/18/2021 1718 by Zadie Rhine, RN Outcome: Adequate for Discharge   Problem: Pain Managment: Goal: General experience of comfort will improve 01/18/2021 1718 by Zadie Rhine, RN Outcome: Adequate for Discharge 01/18/2021 1718 by Zadie Rhine, RN Outcome: Adequate for Discharge   Problem: Safety: Goal: Ability to remain free from injury will improve 01/18/2021 1718 by Zadie Rhine, RN Outcome: Adequate for Discharge 01/18/2021 1718 by Zadie Rhine, RN Outcome: Adequate for Discharge   Problem: Skin Integrity: Goal: Risk for impaired skin integrity will decrease 01/18/2021 1718 by Zadie Rhine, RN Outcome: Adequate for Discharge 01/18/2021 1718 by Zadie Rhine, RN Outcome: Adequate for Discharge

## 2021-01-18 NOTE — Progress Notes (Signed)
Report called to [facility], pt discharged to [facility]. EMS dispatched to transport pt to facility. Spoke with Office manager at [employer]. SRP, RN

## 2021-01-19 ENCOUNTER — Other Ambulatory Visit: Payer: Self-pay

## 2021-01-19 ENCOUNTER — Encounter: Payer: Self-pay | Admitting: *Deleted

## 2021-01-19 DIAGNOSIS — I480 Paroxysmal atrial fibrillation: Secondary | ICD-10-CM | POA: Diagnosis not present

## 2021-01-19 DIAGNOSIS — E039 Hypothyroidism, unspecified: Secondary | ICD-10-CM | POA: Diagnosis not present

## 2021-01-19 DIAGNOSIS — I10 Essential (primary) hypertension: Secondary | ICD-10-CM | POA: Diagnosis not present

## 2021-01-19 DIAGNOSIS — G309 Alzheimer's disease, unspecified: Secondary | ICD-10-CM | POA: Diagnosis not present

## 2021-01-19 NOTE — Patient Outreach (Signed)
[facility]) Care Management  01/19/2021  SAMANTHA OLIVERA Sr. 1950-10-20 [id number]   Case Closure   Patient has been inpatient 10 or more days. RN CM received notification from [facility] hospital liaison that patient being discharged to SNF for LTC.   Plan: RN CM will close case at this time.   Enzo Montgomery, RN,BSN,CCM [facility] Management Telephonic Care Management Coordinator Direct Phone: [phone] Toll Free: [phone] Fax: [phone]

## 2021-01-20 ENCOUNTER — Ambulatory Visit: Payer: Self-pay

## 2021-01-24 DIAGNOSIS — W19XXXA Unspecified fall, initial encounter: Secondary | ICD-10-CM | POA: Diagnosis not present

## 2021-01-24 DIAGNOSIS — M6281 Muscle weakness (generalized): Secondary | ICD-10-CM | POA: Diagnosis not present

## 2021-01-24 DIAGNOSIS — F0390 Unspecified dementia without behavioral disturbance: Secondary | ICD-10-CM | POA: Diagnosis not present

## 2021-01-24 DIAGNOSIS — R600 Localized edema: Secondary | ICD-10-CM | POA: Diagnosis not present

## 2021-02-03 DIAGNOSIS — F0390 Unspecified dementia without behavioral disturbance: Secondary | ICD-10-CM | POA: Diagnosis not present

## 2021-02-03 DIAGNOSIS — M6281 Muscle weakness (generalized): Secondary | ICD-10-CM | POA: Diagnosis not present

## 2021-02-07 DIAGNOSIS — W19XXXA Unspecified fall, initial encounter: Secondary | ICD-10-CM | POA: Diagnosis not present

## 2021-02-07 DIAGNOSIS — F0390 Unspecified dementia without behavioral disturbance: Secondary | ICD-10-CM | POA: Diagnosis not present

## 2021-02-07 DIAGNOSIS — M6281 Muscle weakness (generalized): Secondary | ICD-10-CM | POA: Diagnosis not present

## 2021-02-14 DIAGNOSIS — I10 Essential (primary) hypertension: Secondary | ICD-10-CM | POA: Diagnosis not present

## 2021-02-14 DIAGNOSIS — N400 Enlarged prostate without lower urinary tract symptoms: Secondary | ICD-10-CM | POA: Diagnosis not present

## 2021-02-14 DIAGNOSIS — E1159 Type 2 diabetes mellitus with other circulatory complications: Secondary | ICD-10-CM | POA: Diagnosis not present

## 2021-02-14 DIAGNOSIS — R2681 Unsteadiness on feet: Secondary | ICD-10-CM | POA: Diagnosis not present

## 2021-02-14 DIAGNOSIS — I480 Paroxysmal atrial fibrillation: Secondary | ICD-10-CM | POA: Diagnosis not present

## 2021-02-14 DIAGNOSIS — M6281 Muscle weakness (generalized): Secondary | ICD-10-CM | POA: Diagnosis not present

## 2021-02-14 DIAGNOSIS — F0281 Dementia in other diseases classified elsewhere with behavioral disturbance: Secondary | ICD-10-CM | POA: Diagnosis not present

## 2021-02-14 DIAGNOSIS — I5032 Chronic diastolic (congestive) heart failure: Secondary | ICD-10-CM | POA: Diagnosis not present

## 2021-02-15 DIAGNOSIS — M6281 Muscle weakness (generalized): Secondary | ICD-10-CM | POA: Diagnosis not present

## 2021-02-15 DIAGNOSIS — R2681 Unsteadiness on feet: Secondary | ICD-10-CM | POA: Diagnosis not present

## 2021-02-15 DIAGNOSIS — I5032 Chronic diastolic (congestive) heart failure: Secondary | ICD-10-CM | POA: Diagnosis not present

## 2021-02-24 DIAGNOSIS — E538 Deficiency of other specified B group vitamins: Secondary | ICD-10-CM | POA: Diagnosis not present

## 2021-02-24 DIAGNOSIS — I5032 Chronic diastolic (congestive) heart failure: Secondary | ICD-10-CM | POA: Diagnosis not present

## 2021-02-24 DIAGNOSIS — N400 Enlarged prostate without lower urinary tract symptoms: Secondary | ICD-10-CM | POA: Diagnosis not present

## 2021-02-24 DIAGNOSIS — K5190 Ulcerative colitis, unspecified, without complications: Secondary | ICD-10-CM | POA: Diagnosis not present

## 2021-02-24 DIAGNOSIS — G9341 Metabolic encephalopathy: Secondary | ICD-10-CM | POA: Diagnosis not present

## 2021-02-24 DIAGNOSIS — I2510 Atherosclerotic heart disease of native coronary artery without angina pectoris: Secondary | ICD-10-CM | POA: Diagnosis not present

## 2021-02-24 DIAGNOSIS — I110 Hypertensive heart disease with heart failure: Secondary | ICD-10-CM | POA: Diagnosis not present

## 2021-02-24 DIAGNOSIS — I480 Paroxysmal atrial fibrillation: Secondary | ICD-10-CM | POA: Diagnosis not present

## 2021-02-24 DIAGNOSIS — F0281 Dementia in other diseases classified elsewhere with behavioral disturbance: Secondary | ICD-10-CM | POA: Diagnosis not present

## 2021-02-27 ENCOUNTER — Telehealth: Payer: Self-pay

## 2021-02-27 DIAGNOSIS — N400 Enlarged prostate without lower urinary tract symptoms: Secondary | ICD-10-CM | POA: Diagnosis not present

## 2021-02-27 DIAGNOSIS — I5032 Chronic diastolic (congestive) heart failure: Secondary | ICD-10-CM | POA: Diagnosis not present

## 2021-02-27 DIAGNOSIS — F0281 Dementia in other diseases classified elsewhere with behavioral disturbance: Secondary | ICD-10-CM | POA: Diagnosis not present

## 2021-02-27 DIAGNOSIS — I110 Hypertensive heart disease with heart failure: Secondary | ICD-10-CM | POA: Diagnosis not present

## 2021-02-27 DIAGNOSIS — I480 Paroxysmal atrial fibrillation: Secondary | ICD-10-CM | POA: Diagnosis not present

## 2021-02-27 DIAGNOSIS — G9341 Metabolic encephalopathy: Secondary | ICD-10-CM | POA: Diagnosis not present

## 2021-02-27 DIAGNOSIS — I2510 Atherosclerotic heart disease of native coronary artery without angina pectoris: Secondary | ICD-10-CM | POA: Diagnosis not present

## 2021-02-27 DIAGNOSIS — K5190 Ulcerative colitis, unspecified, without complications: Secondary | ICD-10-CM | POA: Diagnosis not present

## 2021-02-27 DIAGNOSIS — E538 Deficiency of other specified B group vitamins: Secondary | ICD-10-CM | POA: Diagnosis not present

## 2021-02-27 NOTE — Telephone Encounter (Signed)
Sharyn Lull from [facility] calling stated she has completed evaluation for Occupational Therapy.  ~ discontinue home health aide  ~ start occ therapy 2x weekly for 2 weeks 1x week for 1 week   Sharyn Lull can be reached at [phone], secured voicemail, if you have to leave message.

## 2021-02-28 NOTE — Telephone Encounter (Signed)
Will not be able to give worse order until patient is seen.  This is a new order coming from his most recent hospitalization and rehab stay.     It appears he has an appointment tomorrow.

## 2021-03-01 ENCOUNTER — Other Ambulatory Visit: Payer: Self-pay

## 2021-03-01 ENCOUNTER — Ambulatory Visit (INDEPENDENT_AMBULATORY_CARE_PROVIDER_SITE_OTHER): Payer: Medicare HMO | Admitting: Family Medicine

## 2021-03-01 ENCOUNTER — Telehealth: Payer: Self-pay

## 2021-03-01 VITALS — BP 122/87 | HR 104 | Ht 67.0 in | Wt 227.0 lb

## 2021-03-01 DIAGNOSIS — E038 Other specified hypothyroidism: Secondary | ICD-10-CM | POA: Diagnosis not present

## 2021-03-01 DIAGNOSIS — K51019 Ulcerative (chronic) pancolitis with unspecified complications: Secondary | ICD-10-CM

## 2021-03-01 DIAGNOSIS — F339 Major depressive disorder, recurrent, unspecified: Secondary | ICD-10-CM

## 2021-03-01 DIAGNOSIS — I5032 Chronic diastolic (congestive) heart failure: Secondary | ICD-10-CM | POA: Diagnosis not present

## 2021-03-01 DIAGNOSIS — F0390 Unspecified dementia without behavioral disturbance: Secondary | ICD-10-CM

## 2021-03-01 DIAGNOSIS — I480 Paroxysmal atrial fibrillation: Secondary | ICD-10-CM

## 2021-03-01 DIAGNOSIS — I6529 Occlusion and stenosis of unspecified carotid artery: Secondary | ICD-10-CM

## 2021-03-01 DIAGNOSIS — G4700 Insomnia, unspecified: Secondary | ICD-10-CM

## 2021-03-01 DIAGNOSIS — Z79899 Other long term (current) drug therapy: Secondary | ICD-10-CM | POA: Diagnosis not present

## 2021-03-01 DIAGNOSIS — R9431 Abnormal electrocardiogram [ECG] [EKG]: Secondary | ICD-10-CM | POA: Diagnosis not present

## 2021-03-01 DIAGNOSIS — E538 Deficiency of other specified B group vitamins: Secondary | ICD-10-CM | POA: Diagnosis not present

## 2021-03-01 DIAGNOSIS — D6869 Other thrombophilia: Secondary | ICD-10-CM

## 2021-03-01 DIAGNOSIS — I2510 Atherosclerotic heart disease of native coronary artery without angina pectoris: Secondary | ICD-10-CM | POA: Diagnosis not present

## 2021-03-01 DIAGNOSIS — F411 Generalized anxiety disorder: Secondary | ICD-10-CM

## 2021-03-01 DIAGNOSIS — N400 Enlarged prostate without lower urinary tract symptoms: Secondary | ICD-10-CM

## 2021-03-01 DIAGNOSIS — I700 Atherosclerosis of aorta: Secondary | ICD-10-CM

## 2021-03-01 DIAGNOSIS — I110 Hypertensive heart disease with heart failure: Secondary | ICD-10-CM | POA: Diagnosis not present

## 2021-03-01 DIAGNOSIS — F0281 Dementia in other diseases classified elsewhere with behavioral disturbance: Secondary | ICD-10-CM | POA: Diagnosis not present

## 2021-03-01 DIAGNOSIS — E063 Autoimmune thyroiditis: Secondary | ICD-10-CM

## 2021-03-01 DIAGNOSIS — G9341 Metabolic encephalopathy: Secondary | ICD-10-CM | POA: Diagnosis not present

## 2021-03-01 DIAGNOSIS — K5190 Ulcerative colitis, unspecified, without complications: Secondary | ICD-10-CM | POA: Diagnosis not present

## 2021-03-01 DIAGNOSIS — R600 Localized edema: Secondary | ICD-10-CM

## 2021-03-01 MED ORDER — LEVOTHYROXINE SODIUM 50 MCG PO TABS: 50.0000 ug | ORAL_TABLET | Freq: Every day | ORAL | 3 refills | Status: DC

## 2021-03-01 MED ORDER — DOCUSATE SODIUM 100 MG PO CAPS: 100.0000 mg | ORAL_CAPSULE | Freq: Two times a day (BID) | ORAL | 11 refills | Status: AC

## 2021-03-01 MED ORDER — ATORVASTATIN CALCIUM 20 MG PO TABS: 20.0000 mg | ORAL_TABLET | Freq: Every day | ORAL | 11 refills | Status: DC

## 2021-03-01 MED ORDER — FUROSEMIDE 40 MG PO TABS: 40.0000 mg | ORAL_TABLET | ORAL | 11 refills | Status: DC

## 2021-03-01 MED ORDER — APIXABAN 5 MG PO TABS: 5.0000 mg | ORAL_TABLET | Freq: Two times a day (BID) | ORAL | 11 refills | Status: DC

## 2021-03-01 MED ORDER — TAMSULOSIN HCL 0.4 MG PO CAPS: 0.4000 mg | ORAL_CAPSULE | Freq: Every day | ORAL | 1 refills | Status: DC

## 2021-03-01 MED ORDER — DULOXETINE HCL 60 MG PO CPEP: 60.0000 mg | ORAL_CAPSULE | Freq: Every day | ORAL | 0 refills | Status: DC

## 2021-03-01 MED ORDER — POTASSIUM CHLORIDE CRYS ER 10 MEQ PO TBCR: 10.0000 meq | EXTENDED_RELEASE_TABLET | Freq: Every day | ORAL | 3 refills | Status: DC

## 2021-03-01 NOTE — Telephone Encounter (Signed)
Approve verbal order.  Thank you.

## 2021-03-01 NOTE — Telephone Encounter (Signed)
Left detailed message for Selinda Eon of approved orders

## 2021-03-01 NOTE — Telephone Encounter (Signed)
Sharyn Lull from [facility] requesting PT for patient  2x week for 4x weekly 1x week for 4x weekly   Sharyn Lull can be reached at [phone], this is a secure mailbox if you have to leave a message.

## 2021-03-01 NOTE — Progress Notes (Signed)
Told me    This visit occurred during the SARS-CoV-2 public health emergency.  Safety protocols were in place, including screening questions prior to the visit, additional usage of staff PPE, and extensive cleaning of exam room while observing appropriate contact time as indicated for disinfecting solutions.    [name] GODSEY Sr. , 10-03-1950, 70 y.o., male MRN: [id number] Patient Care Team    Relationship Specialty Notifications Start End  [name], DO PCP - General Family Medicine  11/08/17   Troy Sine, MD PCP - Cardiology Cardiology Admissions 02/17/19   [name], MD Consulting Physician Neurology  11/08/17   Clarene Essex, MD Consulting Physician Gastroenterology  11/29/17   Melissa Noon, [place] Referring Physician Optometry  06/30/20   Rod Can, MD Consulting Physician Orthopedic Surgery  06/30/20   Ashley Royalty, [place]  Physician Assistant  06/30/20   Cameron Sprang, MD Consulting Physician Neurology  12/16/20     Chief Complaint  Patient presents with   Follow-up    ED/Rehab     Subjective: Pt presents for an OV for follow-up after admission and rehabilitation admission for altered mental status.  Patient's wife reports that they tried to get him a placement in a memory care unit but it was going to cause them over $5000 a month for his placement, and they cannot afford this.  Therefore he was temporarily placed in a SNF to help with rehabilitation and unfortunately was not able to participate in rehabilitation secondary to his rather severe dementia.  Patient's dementia has rapidly progressed over the last year.  He does not suffer from urinary incontinence.  She attempts to keep him hydrated, but he does not tend to eat or drink anything unless she offers it to him.  He has been started on stool softeners and attempts to discourage constipation.  This was felt to possibly be contributory to his recent encephalopathy along with Bactrim prescription. Patient's wife  states that he seems to be doing better since getting back from rehab.  He is working with therapist in the home and this seems to be going better than working at the rehabilitation because she can help.  He still is not sleeping well.  He was started back on trazodone sometime during his hospitalization.  Patient has QTC elongation and thus twice medications are limited.  He is established with psychiatry, who should be managing all of his medications for mental health.  Wife asks if there is anything that can be given to help him with sleep and does he have to be on the form and have Depakote provided or can he be prescribed a pill that is higher dose to help decrease his pill load.    Depression screen [facility] 2/9 12/13/2020 06/30/2020 12/09/2019 05/26/2019 11/17/2018  Decreased Interest 1 3 0 0 0  Down, Depressed, Hopeless 0 3 1 1 1   PHQ - 2 Score 1 6 1 1 1   Altered sleeping - 3 0 1 1  Tired, decreased energy - 2 2 1 1   Change in appetite - 3 1 1  0  Feeling bad or failure about yourself  - 3 0 1 1  Trouble concentrating - 1 1 1  0  Moving slowly or fidgety/restless - 2 0 1 0  Suicidal thoughts - 1 0 0 0  PHQ-9 Score - 21 5 7 4   Difficult doing work/chores - - Not difficult at all Somewhat difficult Somewhat difficult    Allergies  Allergen Reactions  Losartan Potassium-Hctz Diarrhea   Nsaids     On eliquis.    Zoloft [Sertraline Hcl] Diarrhea   Benzodiazepines Other (See Comments)    Agitation.   Social History   Social History Narrative   Lives at home with wife [name].   College-educated. Works in Public house manager.    Right handed   Past Medical History:  Diagnosis Date   Acute delirium 07/14/2020   Adenomatous colon polyp 2016   Allergy    AMS (altered mental status) 07/13/2020   Anxiety    Bell palsy    resolved   Bell's palsy 09/14/2020   CHF (congestive heart failure) (HCC)    Chicken pox    Concussion with no loss of consciousness 07/07/2018   Depression     Diet-controlled diabetes mellitus ([place])    Eating disorder    Essential hypertension 11/29/2017   Gastro-esophageal reflux disease without esophagitis 09/14/2020   GERD (gastroesophageal reflux disease)    History of vitamin D deficiency    Hyperlipidemia    Hypertension    Hypokalemia    Memory loss    MI (myocardial infarction) ([place])    per pt    Neoplasm of uncertain behavior of skin 09/14/2020   Personal history of colonic polyps 09/14/2020   Ulcerative colitis ([place]) 2005-2006   severe    Past Surgical History:  Procedure Laterality Date   COLONOSCOPY WITH PROPOFOL N/A 07/15/2014   Procedure: COLONOSCOPY WITH PROPOFOL;  Surgeon: [name], MD;  Location: [facility];  Service: Endoscopy;  Laterality: N/A;   COLONOSCOPY WITH PROPOFOL N/A 08/22/2015   Procedure: COLONOSCOPY WITH PROPOFOL;  Surgeon: [name], MD;  Location: [facility];  Service: Endoscopy;  Laterality: N/A;   ORIF ANKLE FRACTURE  02/27/2012   Procedure: OPEN REDUCTION INTERNAL FIXATION (ORIF) ANKLE FRACTURE;  Surgeon: Colin Rhein, MD;  Location: [place];  Service: Orthopedics;  Laterality: Left;  ORIF left bimalleolar ankle fracture   SHOULDER SURGERY     LEFT   SIGMOIDOSCOPY  10/08/2018   Externa and Internal Hmorrhoids. Tubular and Tublovillous adenoma removed from transverse colon. Inflammatory pseudopolyps, negative for dysplasia   TONSILLECTOMY     UPPER GASTROINTESTINAL ENDOSCOPY     Family History  Problem Relation Age of Onset   Dementia Mother    Hypertension Mother    Early death Father 33       drowning    Post-traumatic stress disorder Brother    Neuropathy Brother    Allergies as of 03/01/2021       Reactions   Losartan Potassium-hctz Diarrhea   Nsaids    On eliquis.    Zoloft [sertraline Hcl] Diarrhea   Benzodiazepines Other (See Comments)   Agitation.        Medication List        Accurate as of March 01, 2021  1:44 PM. If you have any questions,  ask your nurse or doctor.          acetaminophen 500 MG tablet Commonly known as: TYLENOL Take 500 mg by mouth every 6 (six) hours as needed for moderate pain.   apixaban 5 MG Tabs tablet Commonly known as: ELIQUIS Take 1 tablet (5 mg total) by mouth 2 (two) times daily. What changed: when to take this   atorvastatin 20 MG tablet Commonly known as: LIPITOR Take 1 tablet (20 mg total) by mouth daily.   azaTHIOprine 50 MG tablet Commonly known as: IMURAN Take 200 mg by  mouth every morning.   divalproex 125 MG capsule Commonly known as: DEPAKOTE SPRINKLE Take 6 capsules (750 mg total) by mouth every 12 (twelve) hours.   docusate sodium 100 MG capsule Commonly known as: Colace Take 1 capsule (100 mg total) by mouth 2 (two) times daily.   DULoxetine 60 MG capsule Commonly known as: CYMBALTA Take 1 capsule (60 mg total) by mouth daily.   furosemide 40 MG tablet Commonly known as: LASIX Take 1 tablet (40 mg total) by mouth 2 (two) times a week.   levocetirizine 5 MG tablet Commonly known as: XYZAL Take 5 mg by mouth every evening.   levothyroxine 50 MCG tablet Commonly known as: Synthroid Take 1 tablet (50 mcg total) by mouth daily before breakfast.   Melatonin 10 MG Tabs Take 10 mg by mouth daily.   OLANZapine 10 MG tablet Commonly known as: ZYPREXA 1/2 tab in the morning and 1.5 tabs QHS What changed:  how much to take how to take this when to take this   polyethylene glycol 17 g packet Commonly known as: MiraLax Take 17 g by mouth daily.   potassium chloride 10 MEQ tablet Commonly known as: Klor-Con M10 Take 1 tablet (10 mEq total) by mouth daily.   tamsulosin 0.4 MG Caps capsule Commonly known as: FLOMAX Take 1 capsule (0.4 mg total) by mouth daily.   traZODone 50 MG tablet Commonly known as: DESYREL TAKE 1 TABLET BY MOUTH EVERYDAY AT BEDTIME What changed: See the new instructions.   vitamin D3 50 MCG (2000 UT) Caps Take 2,000 Units by mouth  daily.   vitamin E 1000 UNIT capsule Take 1,000 Units by mouth daily.        All past medical history, surgical history, allergies, family history, immunizations andmedications were updated in the EMR today and reviewed under the history and medication portions of their EMR.     ROS: Negative, with the exception of above mentioned in HPI   Objective:  BP 122/87   Pulse (!) 104   Ht 5' 7"  (1.702 m)   Wt 227 lb (103 kg)   SpO2 98%   BMI 35.55 kg/m  Body mass index is 35.55 kg/m. Gen: Afebrile. No acute distress. Nontoxic in appearance, well developed, well nourished.  Pleasant male. HENT: AT. [place].  Eyes:Pupils Equal Round Reactive to light, Extraocular movements intact,  Conjunctiva without redness, discharge or icterus. CV: Irregularly irregular, trace edema bilaterally. Chest: CTAB, no wheeze or crackles. Good air movement, normal resp effort. Neuro:  Normal gait. PERLA. EOMi. Alert. Oriented x3  Psych: Normal dress .  Pleasant male.  Does not interact in conversation unless spoken to.  Will interject or respond to questions in a nonsensical manner.  No results found. No results found. No results found for this or any previous visit (from the past 24 hour(s)).  Assessment/Plan: [name] is a 70 y.o. male present for OV for  Major depression, recurrent, chronic (HCC)/anxiety//insomnia They have established with [facility]/psychiatry Encouraged melatonin use and maintaining a regular sleep, eat, wake schedule etc. Trazodone had been started by the inpatient team. Continue Zyprexa, Depakote, Cymbalta 60 mg daily> psychiatry team will need to take over management of all his mental health medications.  I have discussed with his wife to make sure that when she has the upcoming visit with them that they address the sleeping/insomnia issues they are having, the Depakote dosage/format and review of all meds and prescribe all mental health meds for him to decrease  confusion.  She  reports understanding. QT prolongation-precautions on medication choices   dementia without behavioral disturbances -Social services has provided wife with resources. She is also speaking to a lawyer to help with the process of medicaid and researching placement options for him.  Unfortunately it seems this is going to be too expensive for them to consider. Care coordination referral placed last visit.  Social service information and referral placed last visit.  Currently wife is caring for him in the home and overall she feels they are doing okay and has hired services to aid her.   BPH: Stable. Continue Flomax 0.4 mg daily.   Acquired thrombophilia/A. Fib/hyperlipidemia/statin therapy/lower extremity edema/CAD/aortic atherosclerosis/morbid obesity/QT prolongation  Continue Eliquis 5 mg twice daily Continue Lipitor 20 mg daily> could likely dc with advanced dementia.  Continue Lasix 40 mg 2-3 times a week. Continue K-Dur daily Caution on choice of psychiatric meds advised with QT prolongation Continue follow-ups per cardiology.   Hypothyroidism, unspecified type Able.  Continue levothyroxine 50 mcg daily.   TSH normal 01/11/2021.  Chronic ulcerative pancolitis with complication ([place]) Managed by GI Continue Colace and MiraLAX.      Reviewed expectations re: course of current medical issues. Discussed self-management of symptoms. Outlined signs and symptoms indicating need for more acute intervention. Patient verbalized understanding and all questions were answered. Patient received an After-Visit Summary.    No orders of the defined types were placed in this encounter.  No orders of the defined types were placed in this encounter.  Referral Orders  No referral(s) requested today     Note is dictated utilizing voice recognition software. Although note has been proof read prior to signing, occasional typographical errors still can be missed. If any questions arise,  please do not hesitate to call for verification.   electronically signed by:  Howard Pouch, DO  [place]

## 2021-03-01 NOTE — Telephone Encounter (Signed)
Pt seen today. Please advise on orders.

## 2021-03-02 ENCOUNTER — Encounter: Payer: Self-pay | Admitting: Family Medicine

## 2021-03-02 DIAGNOSIS — F0281 Dementia in other diseases classified elsewhere with behavioral disturbance: Secondary | ICD-10-CM | POA: Diagnosis not present

## 2021-03-02 DIAGNOSIS — I2510 Atherosclerotic heart disease of native coronary artery without angina pectoris: Secondary | ICD-10-CM | POA: Diagnosis not present

## 2021-03-02 DIAGNOSIS — G9341 Metabolic encephalopathy: Secondary | ICD-10-CM | POA: Diagnosis not present

## 2021-03-02 DIAGNOSIS — E538 Deficiency of other specified B group vitamins: Secondary | ICD-10-CM | POA: Diagnosis not present

## 2021-03-02 DIAGNOSIS — I480 Paroxysmal atrial fibrillation: Secondary | ICD-10-CM | POA: Diagnosis not present

## 2021-03-02 DIAGNOSIS — I110 Hypertensive heart disease with heart failure: Secondary | ICD-10-CM | POA: Diagnosis not present

## 2021-03-02 DIAGNOSIS — N400 Enlarged prostate without lower urinary tract symptoms: Secondary | ICD-10-CM | POA: Diagnosis not present

## 2021-03-02 DIAGNOSIS — I5032 Chronic diastolic (congestive) heart failure: Secondary | ICD-10-CM | POA: Diagnosis not present

## 2021-03-02 DIAGNOSIS — K5190 Ulcerative colitis, unspecified, without complications: Secondary | ICD-10-CM | POA: Diagnosis not present

## 2021-03-02 MED ORDER — POLYETHYLENE GLYCOL 3350 17 G PO PACK: 17.0000 g | PACK | Freq: Every day | ORAL | 3 refills | Status: DC

## 2021-03-02 NOTE — Telephone Encounter (Signed)
PT request approved

## 2021-03-02 NOTE — Telephone Encounter (Signed)
V/o given to Amy

## 2021-03-03 DIAGNOSIS — K5190 Ulcerative colitis, unspecified, without complications: Secondary | ICD-10-CM | POA: Diagnosis not present

## 2021-03-03 DIAGNOSIS — I480 Paroxysmal atrial fibrillation: Secondary | ICD-10-CM | POA: Diagnosis not present

## 2021-03-03 DIAGNOSIS — N400 Enlarged prostate without lower urinary tract symptoms: Secondary | ICD-10-CM | POA: Diagnosis not present

## 2021-03-03 DIAGNOSIS — I2510 Atherosclerotic heart disease of native coronary artery without angina pectoris: Secondary | ICD-10-CM | POA: Diagnosis not present

## 2021-03-03 DIAGNOSIS — I110 Hypertensive heart disease with heart failure: Secondary | ICD-10-CM | POA: Diagnosis not present

## 2021-03-03 DIAGNOSIS — G9341 Metabolic encephalopathy: Secondary | ICD-10-CM | POA: Diagnosis not present

## 2021-03-03 DIAGNOSIS — I5032 Chronic diastolic (congestive) heart failure: Secondary | ICD-10-CM | POA: Diagnosis not present

## 2021-03-03 DIAGNOSIS — E538 Deficiency of other specified B group vitamins: Secondary | ICD-10-CM | POA: Diagnosis not present

## 2021-03-03 DIAGNOSIS — F0281 Dementia in other diseases classified elsewhere with behavioral disturbance: Secondary | ICD-10-CM | POA: Diagnosis not present

## 2021-03-06 DIAGNOSIS — G9341 Metabolic encephalopathy: Secondary | ICD-10-CM | POA: Diagnosis not present

## 2021-03-06 DIAGNOSIS — I2510 Atherosclerotic heart disease of native coronary artery without angina pectoris: Secondary | ICD-10-CM | POA: Diagnosis not present

## 2021-03-06 DIAGNOSIS — F0281 Dementia in other diseases classified elsewhere with behavioral disturbance: Secondary | ICD-10-CM | POA: Diagnosis not present

## 2021-03-06 DIAGNOSIS — K5190 Ulcerative colitis, unspecified, without complications: Secondary | ICD-10-CM | POA: Diagnosis not present

## 2021-03-06 DIAGNOSIS — I110 Hypertensive heart disease with heart failure: Secondary | ICD-10-CM | POA: Diagnosis not present

## 2021-03-06 DIAGNOSIS — E538 Deficiency of other specified B group vitamins: Secondary | ICD-10-CM | POA: Diagnosis not present

## 2021-03-06 DIAGNOSIS — I5032 Chronic diastolic (congestive) heart failure: Secondary | ICD-10-CM | POA: Diagnosis not present

## 2021-03-06 DIAGNOSIS — N400 Enlarged prostate without lower urinary tract symptoms: Secondary | ICD-10-CM | POA: Diagnosis not present

## 2021-03-06 DIAGNOSIS — I480 Paroxysmal atrial fibrillation: Secondary | ICD-10-CM | POA: Diagnosis not present

## 2021-03-07 DIAGNOSIS — G9341 Metabolic encephalopathy: Secondary | ICD-10-CM | POA: Diagnosis not present

## 2021-03-07 DIAGNOSIS — N400 Enlarged prostate without lower urinary tract symptoms: Secondary | ICD-10-CM | POA: Diagnosis not present

## 2021-03-07 DIAGNOSIS — I480 Paroxysmal atrial fibrillation: Secondary | ICD-10-CM | POA: Diagnosis not present

## 2021-03-07 DIAGNOSIS — I5032 Chronic diastolic (congestive) heart failure: Secondary | ICD-10-CM | POA: Diagnosis not present

## 2021-03-07 DIAGNOSIS — K5190 Ulcerative colitis, unspecified, without complications: Secondary | ICD-10-CM | POA: Diagnosis not present

## 2021-03-07 DIAGNOSIS — F0281 Dementia in other diseases classified elsewhere with behavioral disturbance: Secondary | ICD-10-CM | POA: Diagnosis not present

## 2021-03-07 DIAGNOSIS — I2510 Atherosclerotic heart disease of native coronary artery without angina pectoris: Secondary | ICD-10-CM | POA: Diagnosis not present

## 2021-03-07 DIAGNOSIS — E538 Deficiency of other specified B group vitamins: Secondary | ICD-10-CM | POA: Diagnosis not present

## 2021-03-07 DIAGNOSIS — I110 Hypertensive heart disease with heart failure: Secondary | ICD-10-CM | POA: Diagnosis not present

## 2021-03-08 ENCOUNTER — Telehealth: Payer: Self-pay

## 2021-03-08 DIAGNOSIS — I5032 Chronic diastolic (congestive) heart failure: Secondary | ICD-10-CM | POA: Diagnosis not present

## 2021-03-08 DIAGNOSIS — N400 Enlarged prostate without lower urinary tract symptoms: Secondary | ICD-10-CM | POA: Diagnosis not present

## 2021-03-08 DIAGNOSIS — I480 Paroxysmal atrial fibrillation: Secondary | ICD-10-CM | POA: Diagnosis not present

## 2021-03-08 DIAGNOSIS — I2510 Atherosclerotic heart disease of native coronary artery without angina pectoris: Secondary | ICD-10-CM | POA: Diagnosis not present

## 2021-03-08 DIAGNOSIS — G9341 Metabolic encephalopathy: Secondary | ICD-10-CM | POA: Diagnosis not present

## 2021-03-08 DIAGNOSIS — E538 Deficiency of other specified B group vitamins: Secondary | ICD-10-CM | POA: Diagnosis not present

## 2021-03-08 DIAGNOSIS — K5190 Ulcerative colitis, unspecified, without complications: Secondary | ICD-10-CM | POA: Diagnosis not present

## 2021-03-08 DIAGNOSIS — F339 Major depressive disorder, recurrent, unspecified: Secondary | ICD-10-CM | POA: Diagnosis not present

## 2021-03-08 DIAGNOSIS — I110 Hypertensive heart disease with heart failure: Secondary | ICD-10-CM | POA: Diagnosis not present

## 2021-03-08 DIAGNOSIS — F0281 Dementia in other diseases classified elsewhere with behavioral disturbance: Secondary | ICD-10-CM | POA: Diagnosis not present

## 2021-03-08 NOTE — Telephone Encounter (Signed)
Home health orders received 03/08/21 for [employer] Sr. Home health initiation orders: No.  Home health re-certification orders: Yes. Patient last seen by ordering physician for this condition: yes. Must be less than 90 days for re-certification and less than 30 days prior for initiation. Visit must have been for the condition the orders are being placed.  Patient meets criteria for Physician to sign orders: Yes.        Current med list has been attached: No        Orders placed on physicians desk for signature: 03/08/21 (date) If patient does not meet criteria for orders to be signed: pt was called to schedule appt. Appt is scheduled for n/a.   [name]

## 2021-03-09 NOTE — Telephone Encounter (Signed)
Completed and placed in [place] work basket

## 2021-03-09 NOTE — Telephone Encounter (Signed)
Form fasxed

## 2021-03-10 DIAGNOSIS — G9341 Metabolic encephalopathy: Secondary | ICD-10-CM | POA: Diagnosis not present

## 2021-03-10 DIAGNOSIS — I480 Paroxysmal atrial fibrillation: Secondary | ICD-10-CM | POA: Diagnosis not present

## 2021-03-10 DIAGNOSIS — I5032 Chronic diastolic (congestive) heart failure: Secondary | ICD-10-CM | POA: Diagnosis not present

## 2021-03-10 DIAGNOSIS — F0281 Dementia in other diseases classified elsewhere with behavioral disturbance: Secondary | ICD-10-CM | POA: Diagnosis not present

## 2021-03-10 DIAGNOSIS — I2510 Atherosclerotic heart disease of native coronary artery without angina pectoris: Secondary | ICD-10-CM | POA: Diagnosis not present

## 2021-03-10 DIAGNOSIS — N400 Enlarged prostate without lower urinary tract symptoms: Secondary | ICD-10-CM | POA: Diagnosis not present

## 2021-03-10 DIAGNOSIS — E538 Deficiency of other specified B group vitamins: Secondary | ICD-10-CM | POA: Diagnosis not present

## 2021-03-10 DIAGNOSIS — I110 Hypertensive heart disease with heart failure: Secondary | ICD-10-CM | POA: Diagnosis not present

## 2021-03-10 DIAGNOSIS — K5190 Ulcerative colitis, unspecified, without complications: Secondary | ICD-10-CM | POA: Diagnosis not present

## 2021-03-12 ENCOUNTER — Emergency Department (HOSPITAL_COMMUNITY): Payer: Medicare HMO

## 2021-03-12 ENCOUNTER — Encounter (HOSPITAL_COMMUNITY): Payer: Self-pay

## 2021-03-12 ENCOUNTER — Emergency Department (HOSPITAL_COMMUNITY)
Admission: EM | Admit: 2021-03-12 | Discharge: 2021-03-12 | Disposition: A | Discharge status: Home / Self Care | Payer: Medicare HMO | Source: Home / Self Care | Attending: Emergency Medicine | Admitting: Emergency Medicine

## 2021-03-12 ENCOUNTER — Other Ambulatory Visit: Payer: Self-pay

## 2021-03-12 DIAGNOSIS — Z8249 Family history of ischemic heart disease and other diseases of the circulatory system: Secondary | ICD-10-CM | POA: Diagnosis not present

## 2021-03-12 DIAGNOSIS — I5032 Chronic diastolic (congestive) heart failure: Secondary | ICD-10-CM | POA: Diagnosis present

## 2021-03-12 DIAGNOSIS — R0902 Hypoxemia: Secondary | ICD-10-CM | POA: Diagnosis not present

## 2021-03-12 DIAGNOSIS — G9340 Encephalopathy, unspecified: Secondary | ICD-10-CM | POA: Diagnosis not present

## 2021-03-12 DIAGNOSIS — F32A Depression, unspecified: Secondary | ICD-10-CM | POA: Diagnosis present

## 2021-03-12 DIAGNOSIS — Z7401 Bed confinement status: Secondary | ICD-10-CM | POA: Diagnosis not present

## 2021-03-12 DIAGNOSIS — Z79899 Other long term (current) drug therapy: Secondary | ICD-10-CM | POA: Diagnosis not present

## 2021-03-12 DIAGNOSIS — E039 Hypothyroidism, unspecified: Secondary | ICD-10-CM | POA: Diagnosis present

## 2021-03-12 DIAGNOSIS — R404 Transient alteration of awareness: Secondary | ICD-10-CM | POA: Diagnosis not present

## 2021-03-12 DIAGNOSIS — K51918 Ulcerative colitis, unspecified with other complication: Secondary | ICD-10-CM | POA: Diagnosis not present

## 2021-03-12 DIAGNOSIS — U071 COVID-19: Secondary | ICD-10-CM

## 2021-03-12 DIAGNOSIS — I110 Hypertensive heart disease with heart failure: Secondary | ICD-10-CM | POA: Diagnosis present

## 2021-03-12 DIAGNOSIS — F411 Generalized anxiety disorder: Secondary | ICD-10-CM | POA: Diagnosis present

## 2021-03-12 DIAGNOSIS — G9341 Metabolic encephalopathy: Secondary | ICD-10-CM | POA: Diagnosis present

## 2021-03-12 DIAGNOSIS — G319 Degenerative disease of nervous system, unspecified: Secondary | ICD-10-CM | POA: Diagnosis not present

## 2021-03-12 DIAGNOSIS — I252 Old myocardial infarction: Secondary | ICD-10-CM | POA: Diagnosis not present

## 2021-03-12 DIAGNOSIS — E119 Type 2 diabetes mellitus without complications: Secondary | ICD-10-CM | POA: Diagnosis present

## 2021-03-12 DIAGNOSIS — Z66 Do not resuscitate: Secondary | ICD-10-CM | POA: Diagnosis present

## 2021-03-12 DIAGNOSIS — I10 Essential (primary) hypertension: Secondary | ICD-10-CM | POA: Diagnosis not present

## 2021-03-12 DIAGNOSIS — A419 Sepsis, unspecified organism: Secondary | ICD-10-CM | POA: Diagnosis not present

## 2021-03-12 DIAGNOSIS — R609 Edema, unspecified: Secondary | ICD-10-CM | POA: Diagnosis not present

## 2021-03-12 DIAGNOSIS — R410 Disorientation, unspecified: Secondary | ICD-10-CM | POA: Diagnosis not present

## 2021-03-12 DIAGNOSIS — Z87891 Personal history of nicotine dependence: Secondary | ICD-10-CM | POA: Diagnosis not present

## 2021-03-12 DIAGNOSIS — I480 Paroxysmal atrial fibrillation: Secondary | ICD-10-CM | POA: Diagnosis present

## 2021-03-12 DIAGNOSIS — E7800 Pure hypercholesterolemia, unspecified: Secondary | ICD-10-CM | POA: Diagnosis present

## 2021-03-12 DIAGNOSIS — E538 Deficiency of other specified B group vitamins: Secondary | ICD-10-CM | POA: Diagnosis present

## 2021-03-12 DIAGNOSIS — R531 Weakness: Secondary | ICD-10-CM | POA: Diagnosis present

## 2021-03-12 DIAGNOSIS — Z7901 Long term (current) use of anticoagulants: Secondary | ICD-10-CM | POA: Diagnosis not present

## 2021-03-12 DIAGNOSIS — M6281 Muscle weakness (generalized): Secondary | ICD-10-CM | POA: Diagnosis not present

## 2021-03-12 DIAGNOSIS — I517 Cardiomegaly: Secondary | ICD-10-CM | POA: Diagnosis not present

## 2021-03-12 DIAGNOSIS — F0390 Unspecified dementia without behavioral disturbance: Secondary | ICD-10-CM | POA: Insufficient documentation

## 2021-03-12 DIAGNOSIS — Z79890 Hormone replacement therapy (postmenopausal): Secondary | ICD-10-CM | POA: Diagnosis not present

## 2021-03-12 DIAGNOSIS — F419 Anxiety disorder, unspecified: Secondary | ICD-10-CM | POA: Diagnosis not present

## 2021-03-12 DIAGNOSIS — I4891 Unspecified atrial fibrillation: Secondary | ICD-10-CM | POA: Diagnosis not present

## 2021-03-12 DIAGNOSIS — E785 Hyperlipidemia, unspecified: Secondary | ICD-10-CM | POA: Diagnosis present

## 2021-03-12 DIAGNOSIS — R2681 Unsteadiness on feet: Secondary | ICD-10-CM | POA: Diagnosis not present

## 2021-03-12 DIAGNOSIS — R000 Tachycardia, unspecified: Secondary | ICD-10-CM | POA: Insufficient documentation

## 2021-03-12 DIAGNOSIS — Z8616 Personal history of COVID-19: Secondary | ICD-10-CM | POA: Diagnosis not present

## 2021-03-12 DIAGNOSIS — K219 Gastro-esophageal reflux disease without esophagitis: Secondary | ICD-10-CM | POA: Diagnosis present

## 2021-03-12 DIAGNOSIS — R4182 Altered mental status, unspecified: Secondary | ICD-10-CM | POA: Diagnosis not present

## 2021-03-12 LAB — RESP PANEL BY RT-PCR (FLU A&B, COVID) ARPGX2
Influenza A by PCR: NEGATIVE
Influenza B by PCR: NEGATIVE
SARS Coronavirus 2 by RT PCR: POSITIVE — AB

## 2021-03-12 LAB — COMPREHENSIVE METABOLIC PANEL
ALT: 13 U/L (ref 0–44)
AST: 24 U/L (ref 15–41)
Albumin: 3.3 g/dL — ABNORMAL LOW (ref 3.5–5.0)
Alkaline Phosphatase: 35 U/L — ABNORMAL LOW (ref 38–126)
Anion gap: 9 (ref 5–15)
BUN: 5 mg/dL — ABNORMAL LOW (ref 8–23)
CO2: 27 mmol/L (ref 22–32)
Calcium: 8.5 mg/dL — ABNORMAL LOW (ref 8.9–10.3)
Chloride: 96 mmol/L — ABNORMAL LOW (ref 98–111)
Creatinine, Ser: 0.66 mg/dL (ref 0.61–1.24)
GFR, Estimated: >60 mL/min (ref >60)
Glucose, Bld: 100 mg/dL — ABNORMAL HIGH (ref 70–99)
Potassium: 4.3 mmol/L (ref 3.5–5.1)
Sodium: 132 mmol/L — ABNORMAL LOW (ref 135–145)
Total Bilirubin: 0.8 mg/dL (ref 0.3–1.2)
Total Protein: 5.5 g/dL — ABNORMAL LOW (ref 6.5–8.1)

## 2021-03-12 LAB — CBC WITH DIFFERENTIAL/PLATELET
Abs Immature Granulocytes: 0.1 10*3/uL — ABNORMAL HIGH (ref 0.00–0.07)
Basophils Absolute: 0 10*3/uL (ref 0.0–0.1)
Basophils Relative: 0 %
Eosinophils Absolute: 0 10*3/uL (ref 0.0–0.5)
Eosinophils Relative: 0 %
HCT: 37.5 % — ABNORMAL LOW (ref 39.0–52.0)
Hemoglobin: 12.5 g/dL — ABNORMAL LOW (ref 13.0–17.0)
Immature Granulocytes: 2 %
Lymphocytes Relative: 2 %
Lymphs Abs: 0.1 10*3/uL — ABNORMAL LOW (ref 0.7–4.0)
MCH: 35.9 pg — ABNORMAL HIGH (ref 26.0–34.0)
MCHC: 33.3 g/dL (ref 30.0–36.0)
MCV: 107.8 fL — ABNORMAL HIGH (ref 80.0–100.0)
Monocytes Absolute: 0.5 10*3/uL (ref 0.1–1.0)
Monocytes Relative: 11 %
Neutro Abs: 4 10*3/uL (ref 1.7–7.7)
Neutrophils Relative %: 85 %
Platelets: 157 10*3/uL (ref 150–400)
RBC: 3.48 MIL/uL — ABNORMAL LOW (ref 4.22–5.81)
RDW: 16.1 % — ABNORMAL HIGH (ref 11.5–15.5)
WBC: 4.7 10*3/uL (ref 4.0–10.5)
nRBC: 0 % (ref 0.0–0.2)

## 2021-03-12 LAB — URINALYSIS, ROUTINE W REFLEX MICROSCOPIC
Bilirubin Urine: NEGATIVE
Glucose, UA: NEGATIVE mg/dL
Hgb urine dipstick: NEGATIVE
Ketones, ur: 20 mg/dL — AB
Leukocytes,Ua: NEGATIVE
Nitrite: NEGATIVE
Protein, ur: NEGATIVE mg/dL
Specific Gravity, Urine: 1.009 (ref 1.005–1.030)
pH: 8 (ref 5.0–8.0)

## 2021-03-12 LAB — PROTIME-INR
INR: 1.2 (ref 0.8–1.2)
Prothrombin Time: 15.6 seconds — ABNORMAL HIGH (ref 11.4–15.2)

## 2021-03-12 LAB — LACTIC ACID, PLASMA
Lactic Acid, Venous: 2.1 mmol/L (ref 0.5–1.9)
Lactic Acid, Venous: 1.8 mmol/L (ref 0.5–1.9)

## 2021-03-12 LAB — APTT: aPTT: 32 seconds (ref 24–36)

## 2021-03-12 MED ORDER — LACTATED RINGERS IV BOLUS (SEPSIS)
1000.0000 mL | Freq: Once | INTRAVENOUS | Status: AC
  Administered 2021-03-12: 1000 mL via INTRAVENOUS

## 2021-03-12 MED ORDER — ACETAMINOPHEN 325 MG PO TABS
650.0000 mg | ORAL_TABLET | Freq: Once | ORAL | Status: AC
  Administered 2021-03-12: 650 mg via ORAL
  Filled 2021-03-12: qty 2

## 2021-03-12 MED ORDER — SODIUM CHLORIDE 0.9 % IV SOLN: 1.0000 g | INTRAVENOUS | Status: DC

## 2021-03-12 MED ORDER — MOLNUPIRAVIR EUA 200MG CAPSULE: 4.0000 | ORAL_CAPSULE | Freq: Two times a day (BID) | ORAL | 0 refills | Status: DC

## 2021-03-12 MED ORDER — SODIUM CHLORIDE 0.9 % IV SOLN
2.0000 g | Freq: Once | INTRAVENOUS | Status: AC
  Administered 2021-03-12: 2 g via INTRAVENOUS
  Filled 2021-03-12: qty 20

## 2021-03-12 MED ORDER — LACTATED RINGERS IV SOLN: INTRAVENOUS | Status: DC

## 2021-03-12 NOTE — ED Triage Notes (Signed)
EMS reports from home, called out for increased urination and odor and increased AMS last several days. Hx Advanced Alzheimer's.  BP 140/100 HR 110 RR 20 Sp02 97 RA Temp 101.2 CBG 101  20ga L Wrist

## 2021-03-12 NOTE — Progress Notes (Signed)
[facility] following for sepsis protocol. 

## 2021-03-12 NOTE — ED Notes (Signed)
Critical result of lactic acid of 2.1 reported to [place], [place]. Pt has fluids and antibiotics running, will recollect lactic acid when appropriate.

## 2021-03-12 NOTE — ED Provider Notes (Signed)
[facility] DEPT Provider Note   CSN: [id number] Arrival date & time: 03/12/21  1827     History Chief Complaint  Patient presents with   Altered Mental Status   Dysuria    CLEDITH ABDOU Sr. is a 70 y.o. male.  The history is provided by the patient and a relative (Son).      Level 5 caveat due to altered mental status and underlying dementia.  [name] is a 70 y.o. male, with a history of dementia, MI, hyperlipidemia, HTN, GERD, DM, presenting to the ED with change in mental status noted by at least 5 AM this morning.  History is provided by the patient and his son at the bedside.  He has confusion and difficulty recognizing familiar faces at baseline due to his dementia. This morning, he was not talking as much, was not as interactive, and was not answering questions, which is typically unusual for him. Son states he also noted patient's urine smelled much stronger today. Son states patient had complained of a headache at one point today. There are other family members in the household who have cough, upper respiratory symptoms, and fever.  Upon my interview with the patient, patient's son states he is more interactive now than he has been all day. Son denies any complaints of chest pain, abdominal pain, dizziness, neck pain/stiffness, rash, wounds, shortness of breath, cough, or any other complaints.  Past Medical History:  Diagnosis Date   Acute delirium 07/14/2020   Adenomatous colon polyp 2016   Allergy    AMS (altered mental status) 07/13/2020   Anxiety    Bell palsy    resolved   Bell's palsy 09/14/2020   CHF (congestive heart failure) (HCC)    Chicken pox    Concussion with no loss of consciousness 07/07/2018   Depression    Diet-controlled diabetes mellitus ([place])    Eating disorder    Essential hypertension 11/29/2017   Gastro-esophageal reflux disease without esophagitis 09/14/2020   GERD (gastroesophageal reflux disease)     History of vitamin D deficiency    Hyperlipidemia    Hypertension    Hypokalemia    Memory loss    MI (myocardial infarction) ([place])    per pt    Neoplasm of uncertain behavior of skin 09/14/2020   Personal history of colonic polyps 09/14/2020   Ulcerative colitis ([place]) 2005-2006   severe     Patient Active Problem List   Diagnosis Date Noted   Paroxysmal atrial fibrillation ([place]) 01/11/2021   Chronic diastolic CHF (congestive heart failure) ([place]) 78/29/5621   Acute metabolic encephalopathy 30/86/5784   Lower extremity edema 12/09/2020   Benign prostatic hyperplasia 09/15/2020   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   Hypercholesterolemia 09/14/2020   Hypothyroidism    Vitamin B 12 deficiency    On statin therapy 05/26/2019   Acquired thrombophilia ([place]) 05/26/2019   QT prolongation 02/19/2019   Aortic atherosclerosis ([place]) 02/18/2019   Carotid artery calcification 02/18/2019   Cardiomegaly 02/18/2019   Dementia ([place]) 02/17/2019   Hypokalemia 02/17/2019   GAD (generalized anxiety disorder) 05/14/2018   Insomnia 11/29/2017   Moderate obstructive sleep apnea 11/08/2017   Morbid obesity ([place]) 11/08/2017   Major depression, recurrent, chronic ([place]) 09/18/2017    Past Surgical History:  Procedure Laterality Date   COLONOSCOPY WITH PROPOFOL N/A 07/15/2014   Procedure: COLONOSCOPY WITH PROPOFOL;  Surgeon: [name], MD;  Location: [facility];  Service: Endoscopy;  Laterality: N/A;  COLONOSCOPY WITH PROPOFOL N/A 08/22/2015   Procedure: COLONOSCOPY WITH PROPOFOL;  Surgeon: [name], MD;  Location: [facility];  Service: Endoscopy;  Laterality: N/A;   ORIF ANKLE FRACTURE  02/27/2012   Procedure: OPEN REDUCTION INTERNAL FIXATION (ORIF) ANKLE FRACTURE;  Surgeon: Colin Rhein, MD;  Location: [facility];  Service: Orthopedics;  Laterality: Left;  ORIF left bimalleolar ankle fracture   SHOULDER SURGERY     LEFT   SIGMOIDOSCOPY  10/08/2018    Externa and Internal Hmorrhoids. Tubular and Tublovillous adenoma removed from transverse colon. Inflammatory pseudopolyps, negative for dysplasia   TONSILLECTOMY     UPPER GASTROINTESTINAL ENDOSCOPY         Family History  Problem Relation Age of Onset   Dementia Mother    Hypertension Mother    Early death Father 48       drowning    Post-traumatic stress disorder Brother    Neuropathy Brother     Social History   Tobacco Use   Smoking status: Former    Packs/day: 1.00    Years: 19.00    Pack years: 19.00    Types: Cigarettes    Quit date: 02/25/1982    Years since quitting: 39.0   Smokeless tobacco: Former    Quit date: 1990  Scientific laboratory technician Use: Never used  Substance Use Topics   Alcohol use: Yes    Comment: occ   Drug use: No    Home Medications Prior to Admission medications   Medication Sig Start Date End Date Taking? Authorizing Provider  acetaminophen (TYLENOL) 500 MG tablet Take 500 mg by mouth every 6 (six) hours as needed for mild pain, headache or fever.   Yes [provider]  apixaban (ELIQUIS) 5 MG TABS tablet Take 1 tablet (5 mg total) by mouth 2 (two) times daily. 03/01/21 02/24/22 Yes [name], [name], DO  atorvastatin (LIPITOR) 20 MG tablet Take 1 tablet (20 mg total) by mouth daily. Patient taking differently: Take 20 mg by mouth every evening. 03/01/21  Yes [name], [name], DO  azaTHIOprine (IMURAN) 50 MG tablet Take 200 mg by mouth every morning.   Yes [provider]  divalproex (DEPAKOTE SPRINKLE) 125 MG capsule Take 6 capsules (750 mg total) by mouth every 12 (twelve) hours. 09/14/20  Yes [name], [name], DO  DULoxetine (CYMBALTA) 60 MG capsule Take 1 capsule (60 mg total) by mouth daily. 03/01/21  Yes [name], [name], DO  furosemide (LASIX) 40 MG tablet Take 1 tablet (40 mg total) by mouth 2 (two) times a week. Patient taking differently: Take 40 mg by mouth 2 (two) times a week. On Thursdays and Sundays 03/02/21  Yes [name],  [name], DO  levocetirizine (XYZAL) 5 MG tablet Take 5 mg by mouth every evening.   Yes [provider]  levothyroxine (SYNTHROID) 50 MCG tablet Take 1 tablet (50 mcg total) by mouth daily before breakfast. 03/01/21 05/30/21 Yes [name], [name], DO  Melatonin 10 MG TABS Take 10 mg by mouth at bedtime.   Yes [provider]  molnupiravir EUA 200 mg CAPS Take 4 capsules (800 mg total) by mouth 2 (two) times daily for 5 days. 03/12/21 03/17/21 Yes Bryna Razavi C, PA-C  OLANZapine (ZYPREXA) 10 MG tablet 1/2 tab in the morning and 1.5 tabs QHS Patient taking differently: Take 5 mg by mouth daily. 12/19/20  Yes [name], [name], DO  potassium chloride (KLOR-CON M10) 10 MEQ tablet Take 1 tablet (  10 mEq total) by mouth daily. 03/01/21  Yes [name], [name], DO  tamsulosin (FLOMAX) 0.4 MG CAPS capsule Take 1 capsule (0.4 mg total) by mouth daily. 03/01/21  Yes [name], [name], DO  traZODone (DESYREL) 50 MG tablet TAKE 1 TABLET BY MOUTH EVERYDAY AT BEDTIME Patient taking differently: Take 50 mg by mouth at bedtime. 01/09/21  Yes Cameron Sprang, MD  docusate sodium (COLACE) 100 MG capsule Take 1 capsule (100 mg total) by mouth 2 (two) times daily. Patient not taking: Reported on 03/12/2021 03/01/21 03/01/22  Howard Pouch A, DO  polyethylene glycol (MIRALAX) 17 g packet Take 17 g by mouth daily. Patient not taking: Reported on 03/12/2021 03/02/21   Howard Pouch A, DO    Allergies    Losartan potassium-hctz, Nsaids, Zoloft [sertraline hcl], and Benzodiazepines  Review of Systems   Review of Systems  Unable to perform ROS: Dementia   Physical Exam Updated Vital Signs BP 119/82   Pulse (!) 110   Temp (!) 101.3 F (38.5 C) (Oral)   Resp 20   SpO2 95%   Physical Exam Vitals and nursing note reviewed.  Constitutional:      General: He is not in acute distress.    Appearance: He is well-developed. He is not diaphoretic.  HENT:     Head: Normocephalic and atraumatic.     Mouth/Throat:     Mouth:  Mucous membranes are moist.     Pharynx: Oropharynx is clear.  Eyes:     Conjunctiva/sclera: Conjunctivae normal.  Cardiovascular:     Rate and Rhythm: Regular rhythm. Tachycardia present.     Pulses: Normal pulses.          Radial pulses are 2+ on the right side and 2+ on the left side.       Posterior tibial pulses are 2+ on the right side and 2+ on the left side.     Heart sounds: Normal heart sounds.     Comments: Tactile temperature in the extremities appropriate and equal bilaterally. Pulmonary:     Effort: Pulmonary effort is normal. No respiratory distress.     Breath sounds: Normal breath sounds.  Abdominal:     Palpations: Abdomen is soft.     Tenderness: There is no abdominal tenderness. There is no guarding.  Musculoskeletal:     Cervical back: Neck supple.     Right lower leg: No edema.     Left lower leg: No edema.  Lymphadenopathy:     Cervical: No cervical adenopathy.  Skin:    General: Skin is warm and dry.  Neurological:     Mental Status: He is alert.     Comments: Patient can answer with his name, but not the other orientation questions. He will sometimes give seemingly random answers.  For example when I asked if he was having abdominal pain he replied, "I am not having it anymore, but I expect it to return around Tuesday." Sensation grossly intact to light touch in the extremities.   Grip strengths equal bilaterally.   Strength 3/5 in all extremities, equal bilaterally.  Cranial nerves III-XII grossly intact.  Handles oral secretions without noted difficulty.  No noted phonation or speech deficit. Patient's son at the bedside states patient has residual left-sided facial droop from 2 episodes of Bell's palsy in the past.  He states it does not look different today than it usually does.  Any facial droop is mild and questionable on my exam.  Psychiatric:  Mood and Affect: Mood and affect normal.        Speech: Speech normal.        Behavior: Behavior  normal.    ED Results / Procedures / Treatments   Labs (all labs ordered are listed, but only abnormal results are displayed) Labs Reviewed  RESP PANEL BY RT-PCR (FLU A&B, COVID) ARPGX2 - Abnormal; Notable for the following components:      Result Value   SARS Coronavirus 2 by RT PCR POSITIVE (*)    All other components within normal limits  LACTIC ACID, PLASMA - Abnormal; Notable for the following components:   Lactic Acid, Venous 2.1 (*)    All other components within normal limits  COMPREHENSIVE METABOLIC PANEL - Abnormal; Notable for the following components:   Sodium 132 (*)    Chloride 96 (*)    Glucose, Bld 100 (*)    BUN 5 (*)    Calcium 8.5 (*)    Total Protein 5.5 (*)    Albumin 3.3 (*)    Alkaline Phosphatase 35 (*)    All other components within normal limits  CBC WITH DIFFERENTIAL/PLATELET - Abnormal; Notable for the following components:   RBC 3.48 (*)    Hemoglobin 12.5 (*)    HCT 37.5 (*)    MCV 107.8 (*)    MCH 35.9 (*)    RDW 16.1 (*)    Lymphs Abs 0.1 (*)    Abs Immature Granulocytes 0.10 (*)    All other components within normal limits  PROTIME-INR - Abnormal; Notable for the following components:   Prothrombin Time 15.6 (*)    All other components within normal limits  URINALYSIS, ROUTINE W REFLEX MICROSCOPIC - Abnormal; Notable for the following components:   Ketones, ur 20 (*)    All other components within normal limits  CULTURE, BLOOD (ROUTINE X 2)  CULTURE, BLOOD (ROUTINE X 2)  URINE CULTURE  LACTIC ACID, PLASMA  APTT    EKG EKG Interpretation  Date/Time:  Sunday March 12 2021 19:03:14 EDT Ventricular Rate:  111 PR Interval:    QRS Duration: 114 QT Interval:  312 QTC Calculation: 424 R Axis:   39 Text Interpretation: Atrial fibrillation with rapid ventricular response Nonspecific T wave abnormality Abnormal ECG Confirmed by Thamas Jaegers ([id number]) on 03/12/2021 7:15:57 PM  Radiology CT Head Wo Contrast  Result Date: 03/12/2021 CLINICAL  DATA:  Mental status change for unknown cause. History of advanced Alzheimer's. EXAM: CT HEAD WITHOUT CONTRAST TECHNIQUE: Contiguous axial images were obtained from the base of the skull through the vertex without intravenous contrast. COMPARISON:  01/10/2021 . FINDINGS: Brain: No evidence of acute infarction, hemorrhage, hydrocephalus, extra-axial collection or mass lesion/mass effect. Diffuse cerebral atrophy. Ventricular dilatation likely due to central atrophy. Low-attenuation changes in the deep white matter likely due to small vessel ischemia. Vascular: Moderate intracranial arterial vascular calcifications. Skull: Calvarium appears intact. Sinuses/Orbits: Paranasal sinuses and mastoid air cells are clear. Other: None. IMPRESSION: No acute intracranial abnormalities. Chronic atrophy and small vessel ischemic changes. Electronically Signed   By: Lucienne Capers M.D.   On: 03/12/2021 21:04   DG Chest Port 1 View  Result Date: 03/12/2021 CLINICAL DATA:  Urosepsis.  Altered mental status. EXAM: PORTABLE CHEST 1 VIEW COMPARISON:  01/10/2021 FINDINGS: Lordotic positioning noted. Heart size is stable lung for differences in positioning. Both lungs are clear. IMPRESSION: No active disease. Electronically Signed   By: Marlaine Hind M.D.   On: 03/12/2021 20:02    Procedures Procedures  Medications Ordered in ED Medications  lactated ringers infusion ( Intravenous New Bag/Given 03/12/21 1927)  lactated ringers bolus 1,000 mL (0 mLs Intravenous Stopped 03/12/21 2110)  cefTRIAXone (ROCEPHIN) 2 g in sodium chloride 0.9 % 100 mL IVPB (0 g Intravenous Stopped 03/12/21 2013)  acetaminophen (TYLENOL) tablet 650 mg (650 mg Oral Given 03/12/21 2112)    ED Course  I have reviewed the triage vital signs and the nursing notes.  Pertinent labs & imaging results that were available during my care of the patient were reviewed by me and considered in my medical decision making (see chart for details).    MDM  Rules/Calculators/A&P                          Patient presents with increased confusion. Febrile and mildly tachycardic on arrival. Patient is nontoxic appearing, not tachypneic, not hypotensive, maintains excellent SPO2 on room air, and is in no apparent distress.   I have reviewed the patient's chart to obtain more information.   I reviewed and interpreted the patient's labs and radiological studies. COVID-positive.  We addressed the patient's fever and the son reports improvement in the patient's mental status to his baseline. He is COVID vaccinated. Improvement with mildly elevated lactic acid following IV fluids. I discussed possible treatment regimens with the patient and his son. Patient and his son were given instructions for home care as well as return precautions.  Both parties voice understanding of these instructions, accept the plan, and are comfortable with discharge.   Findings and plan of care discussed with attending physician, Welford Roche, MD.   Vitals:   03/12/21 2026 03/12/21 2130 03/12/21 2215 03/12/21 2230  BP:  133/77 123/79   Pulse:  (!) 106 100   Resp:  19 20   Temp:    99 F (37.2 C)  TempSrc:    Oral  SpO2:  93% 94%   Weight: 102.9 kg     Height: 5' 7"  (1.702 m)        Final Clinical Impression(s) / ED Diagnoses Final diagnoses:  COVID-19    Rx / DC Orders ED Discharge Orders          Ordered    molnupiravir EUA 200 mg CAPS  2 times daily        03/12/21 2236             Layla Maw 03/12/21 2302    Luna Fuse, MD 03/17/21 [phone]

## 2021-03-12 NOTE — Discharge Instructions (Addendum)
COVID-19 Home Management:  You have had a positive test for COVID-19. COVID-19 is caused by a virus. Viruses do not require or respond to antibiotics. Treatment is symptomatic care and it is important to note that these symptoms may last for 7-14 days.   Hand washing: Wash your hands throughout the day, but especially before and after touching the face, using the restroom, sneezing, coughing, or touching surfaces that have been coughed or sneezed upon. Hydration: Symptoms of most illnesses will be intensified and complicated by dehydration. Dehydration can also extend the duration of symptoms. Drink plenty of fluids and get plenty of rest. You should be drinking at least half a liter of water an hour to stay hydrated. Electrolyte drinks (ex. Gatorade, Powerade, Pedialyte) are also encouraged. You should be drinking enough fluids to make your urine light yellow, almost clear. If this is not the case, you are not drinking enough water. Please note that some of the treatments indicated below will not be effective if you are not adequately hydrated. Diet: Please concentrate on hydration, however, you may introduce food slowly.  Start with a clear liquid diet, progressed to a full liquid diet, and then bland solids as you are able. Pain or fever:  Acetaminophen: May take acetaminophen (generic for Tylenol), as needed, for pain. Your daily total maximum amount of acetaminophen from all sources should be limited to 4015m/day for persons without liver problems, or 20026mday for those with liver problems. Cough: Teas, warm liquids, broths, and honey can help with cough. Zyrtec or Claritin: May add these medication daily to control underlying symptoms of congestion, sneezing, and other signs of allergies.  These medications are available over-the-counter. Generics: Cetirizine (generic for Zyrtec) and loratadine (generic for Claritin). Fluticasone: Use fluticasone (generic for Flonase), as directed, for nasal and  sinus congestion.  This medication is available over-the-counter. Congestion: Plain guaifenesin (generic for plain Mucinex) may help relieve congestion. Saline sinus rinses and saline nasal sprays may also help relieve congestion.  Sore throat: Warm liquids or Chloraseptic spray may help soothe a sore throat. Gargle twice a day with a salt water solution made from a half teaspoon of salt in a cup of warm water.  Follow up: Follow up with a primary care provider within the next two weeks should symptoms fail to resolve. Return: Return to the ED for significantly worsening symptoms, shortness of breath, persistent/worsening chest pain, persistent vomiting, large amounts of blood in stool, worsening/localized abdominal pain, or any other major concerns.  For prescription assistance, may try using prescription discount sites or apps, such as goodrx.com  COVID-19 isolation recommendations  Patients who have symptoms consistent with COVID-19 should self isolate until: At least 3 days (72 hours) have passed since recovery, defined as resolution of fever without the use of fever reducing medications and improvement in respiratory symptoms (e.g., cough, shortness of breath), and At least 7 days have passed since symptoms first appeared. Retesting is not required and not recommended as patients can continue to test positive for several weeks despite lack of symptoms.

## 2021-03-12 NOTE — ED Notes (Signed)
Pt placed on condom cath due to incontinence.

## 2021-03-14 ENCOUNTER — Inpatient Hospital Stay (HOSPITAL_COMMUNITY)
Admission: EM | Admit: 2021-03-14 | Discharge: 2021-03-23 | DRG: 177 | Disposition: A | Discharge status: Skilled Nursing Facility | Payer: Medicare HMO | Attending: Family Medicine | Admitting: Family Medicine

## 2021-03-14 ENCOUNTER — Emergency Department (HOSPITAL_COMMUNITY): Payer: Medicare HMO

## 2021-03-14 ENCOUNTER — Encounter (HOSPITAL_COMMUNITY): Payer: Self-pay

## 2021-03-14 ENCOUNTER — Other Ambulatory Visit: Payer: Self-pay

## 2021-03-14 ENCOUNTER — Inpatient Hospital Stay (HOSPITAL_COMMUNITY): Payer: Medicare HMO

## 2021-03-14 DIAGNOSIS — R4182 Altered mental status, unspecified: Secondary | ICD-10-CM

## 2021-03-14 DIAGNOSIS — F32A Depression, unspecified: Secondary | ICD-10-CM | POA: Diagnosis present

## 2021-03-14 DIAGNOSIS — I252 Old myocardial infarction: Secondary | ICD-10-CM

## 2021-03-14 DIAGNOSIS — E039 Hypothyroidism, unspecified: Secondary | ICD-10-CM | POA: Diagnosis present

## 2021-03-14 DIAGNOSIS — Z66 Do not resuscitate: Secondary | ICD-10-CM | POA: Diagnosis present

## 2021-03-14 DIAGNOSIS — I480 Paroxysmal atrial fibrillation: Secondary | ICD-10-CM | POA: Diagnosis present

## 2021-03-14 DIAGNOSIS — E119 Type 2 diabetes mellitus without complications: Secondary | ICD-10-CM | POA: Diagnosis present

## 2021-03-14 DIAGNOSIS — Z79899 Other long term (current) drug therapy: Secondary | ICD-10-CM | POA: Diagnosis not present

## 2021-03-14 DIAGNOSIS — K219 Gastro-esophageal reflux disease without esophagitis: Secondary | ICD-10-CM | POA: Diagnosis present

## 2021-03-14 DIAGNOSIS — U071 COVID-19: Principal | ICD-10-CM | POA: Diagnosis present

## 2021-03-14 DIAGNOSIS — G9340 Encephalopathy, unspecified: Secondary | ICD-10-CM | POA: Diagnosis not present

## 2021-03-14 DIAGNOSIS — R531 Weakness: Secondary | ICD-10-CM

## 2021-03-14 DIAGNOSIS — Z8249 Family history of ischemic heart disease and other diseases of the circulatory system: Secondary | ICD-10-CM

## 2021-03-14 DIAGNOSIS — E785 Hyperlipidemia, unspecified: Secondary | ICD-10-CM | POA: Diagnosis present

## 2021-03-14 DIAGNOSIS — I110 Hypertensive heart disease with heart failure: Secondary | ICD-10-CM | POA: Diagnosis present

## 2021-03-14 DIAGNOSIS — Z87891 Personal history of nicotine dependence: Secondary | ICD-10-CM

## 2021-03-14 DIAGNOSIS — Z79890 Hormone replacement therapy (postmenopausal): Secondary | ICD-10-CM | POA: Diagnosis not present

## 2021-03-14 DIAGNOSIS — I5032 Chronic diastolic (congestive) heart failure: Secondary | ICD-10-CM | POA: Diagnosis present

## 2021-03-14 DIAGNOSIS — E7800 Pure hypercholesterolemia, unspecified: Secondary | ICD-10-CM | POA: Diagnosis present

## 2021-03-14 DIAGNOSIS — G9341 Metabolic encephalopathy: Secondary | ICD-10-CM | POA: Diagnosis present

## 2021-03-14 DIAGNOSIS — G319 Degenerative disease of nervous system, unspecified: Secondary | ICD-10-CM | POA: Diagnosis not present

## 2021-03-14 DIAGNOSIS — F411 Generalized anxiety disorder: Secondary | ICD-10-CM | POA: Diagnosis present

## 2021-03-14 DIAGNOSIS — E538 Deficiency of other specified B group vitamins: Secondary | ICD-10-CM | POA: Diagnosis present

## 2021-03-14 DIAGNOSIS — K5100 Ulcerative (chronic) pancolitis without complications: Secondary | ICD-10-CM | POA: Diagnosis present

## 2021-03-14 DIAGNOSIS — Z7901 Long term (current) use of anticoagulants: Secondary | ICD-10-CM

## 2021-03-14 DIAGNOSIS — R5381 Other malaise: Secondary | ICD-10-CM

## 2021-03-14 DIAGNOSIS — F0390 Unspecified dementia without behavioral disturbance: Secondary | ICD-10-CM | POA: Diagnosis present

## 2021-03-14 LAB — COMPREHENSIVE METABOLIC PANEL
ALT: 21 U/L (ref 0–44)
AST: 86 U/L — ABNORMAL HIGH (ref 15–41)
Albumin: 3.1 g/dL — ABNORMAL LOW (ref 3.5–5.0)
Alkaline Phosphatase: 33 U/L — ABNORMAL LOW (ref 38–126)
Anion gap: 8 (ref 5–15)
BUN: 8 mg/dL (ref 8–23)
CO2: 28 mmol/L (ref 22–32)
Calcium: 8.4 mg/dL — ABNORMAL LOW (ref 8.9–10.3)
Chloride: 95 mmol/L — ABNORMAL LOW (ref 98–111)
Creatinine, Ser: 0.68 mg/dL (ref 0.61–1.24)
GFR, Estimated: >60 mL/min (ref >60)
Glucose, Bld: 94 mg/dL (ref 70–99)
Sodium: 131 mmol/L — ABNORMAL LOW (ref 135–145)
Total Bilirubin: 1.9 mg/dL — ABNORMAL HIGH (ref 0.3–1.2)
Total Protein: 6.2 g/dL — ABNORMAL LOW (ref 6.5–8.1)

## 2021-03-14 LAB — FIBRINOGEN: Fibrinogen: 414 mg/dL (ref 210–475)

## 2021-03-14 LAB — CBC WITH DIFFERENTIAL/PLATELET
Abs Immature Granulocytes: 0.04 10*3/uL (ref 0.00–0.07)
Basophils Absolute: 0 10*3/uL (ref 0.0–0.1)
Basophils Relative: 0 %
Eosinophils Absolute: 0 10*3/uL (ref 0.0–0.5)
Eosinophils Relative: 0 %
HCT: 40.4 % (ref 39.0–52.0)
Hemoglobin: 14 g/dL (ref 13.0–17.0)
Immature Granulocytes: 1 %
Lymphocytes Relative: 7 %
Lymphs Abs: 0.3 10*3/uL — ABNORMAL LOW (ref 0.7–4.0)
MCH: 36.1 pg — ABNORMAL HIGH (ref 26.0–34.0)
MCHC: 34.7 g/dL (ref 30.0–36.0)
MCV: 104.1 fL — ABNORMAL HIGH (ref 80.0–100.0)
Monocytes Absolute: 0.6 10*3/uL (ref 0.1–1.0)
Monocytes Relative: 14 %
Neutro Abs: 3.3 10*3/uL (ref 1.7–7.7)
Neutrophils Relative %: 78 %
Platelets: 138 10*3/uL — ABNORMAL LOW (ref 150–400)
RBC: 3.88 MIL/uL — ABNORMAL LOW (ref 4.22–5.81)
RDW: 16 % — ABNORMAL HIGH (ref 11.5–15.5)
WBC: 4.2 10*3/uL (ref 4.0–10.5)
nRBC: 0 % (ref 0.0–0.2)

## 2021-03-14 LAB — URINALYSIS, ROUTINE W REFLEX MICROSCOPIC
Bilirubin Urine: NEGATIVE
Glucose, UA: NEGATIVE mg/dL
Hgb urine dipstick: NEGATIVE
Ketones, ur: 20 mg/dL — AB
Leukocytes,Ua: NEGATIVE
Nitrite: NEGATIVE
Protein, ur: NEGATIVE mg/dL
Specific Gravity, Urine: 1.011 (ref 1.005–1.030)
pH: 6 (ref 5.0–8.0)

## 2021-03-14 LAB — BLOOD GAS, ARTERIAL
Acid-Base Excess: 3.4 mmol/L — ABNORMAL HIGH (ref 0.0–2.0)
Bicarbonate: 26.5 mmol/L (ref 20.0–28.0)
O2 Saturation: 97.3 %
Patient temperature: 98.6
pCO2 arterial: 36.5 mmHg (ref 32.0–48.0)
pH, Arterial: 7.474 — ABNORMAL HIGH (ref 7.350–7.450)
pO2, Arterial: 84.8 mmHg (ref 83.0–108.0)

## 2021-03-14 LAB — URINE CULTURE: Culture: NO GROWTH

## 2021-03-14 LAB — D-DIMER, QUANTITATIVE: D-Dimer, Quant: 0.99 ug/mL-FEU — ABNORMAL HIGH (ref 0.00–0.50)

## 2021-03-14 LAB — AMMONIA: Ammonia: 32 umol/L (ref 9–35)

## 2021-03-14 LAB — PROCALCITONIN: Procalcitonin: <0.1 ng/mL

## 2021-03-14 LAB — LACTATE DEHYDROGENASE: LDH: 160 U/L (ref 98–192)

## 2021-03-14 LAB — VALPROIC ACID LEVEL: Valproic Acid Lvl: 72 ug/mL (ref 50.0–100.0)

## 2021-03-14 LAB — BASIC METABOLIC PANEL
Anion gap: 11 (ref 5–15)
BUN: 9 mg/dL (ref 8–23)
CO2: 30 mmol/L (ref 22–32)
Calcium: 8.4 mg/dL — ABNORMAL LOW (ref 8.9–10.3)
Chloride: 92 mmol/L — ABNORMAL LOW (ref 98–111)
Creatinine, Ser: 0.94 mg/dL (ref 0.61–1.24)
GFR, Estimated: >60 mL/min (ref >60)
Glucose, Bld: 92 mg/dL (ref 70–99)
Potassium: 4 mmol/L (ref 3.5–5.1)
Sodium: 133 mmol/L — ABNORMAL LOW (ref 135–145)

## 2021-03-14 LAB — C-REACTIVE PROTEIN: CRP: 4.5 mg/dL — ABNORMAL HIGH (ref <1.0)

## 2021-03-14 LAB — GLUCOSE, CAPILLARY: Glucose-Capillary: 99 mg/dL (ref 70–99)

## 2021-03-14 LAB — LACTIC ACID, PLASMA: Lactic Acid, Venous: 1.3 mmol/L (ref 0.5–1.9)

## 2021-03-14 LAB — FERRITIN: Ferritin: 360 ng/mL — ABNORMAL HIGH (ref 24–336)

## 2021-03-14 LAB — TRIGLYCERIDES: Triglycerides: 67 mg/dL (ref <150)

## 2021-03-14 LAB — CBG MONITORING, ED: Glucose-Capillary: 92 mg/dL (ref 70–99)

## 2021-03-14 MED ORDER — ACETAMINOPHEN 650 MG RE SUPP
650.0000 mg | Freq: Four times a day (QID) | RECTAL | Status: DC | PRN
  Administered 2021-03-14: 650 mg via RECTAL
  Filled 2021-03-14: qty 1

## 2021-03-14 MED ORDER — LEVOTHYROXINE SODIUM 100 MCG/5ML IV SOLN
25.0000 ug | Freq: Every day | INTRAVENOUS | Status: DC
  Administered 2021-03-15: 25 ug via INTRAVENOUS
  Filled 2021-03-14: qty 5

## 2021-03-14 MED ORDER — SODIUM CHLORIDE 0.9 % IV BOLUS
1000.0000 mL | Freq: Once | INTRAVENOUS | Status: AC
  Administered 2021-03-14: 1000 mL via INTRAVENOUS

## 2021-03-14 MED ORDER — METOPROLOL TARTRATE 5 MG/5ML IV SOLN
5.0000 mg | Freq: Once | INTRAVENOUS | Status: AC
  Administered 2021-03-14: 5 mg via INTRAVENOUS
  Filled 2021-03-14: qty 5

## 2021-03-14 MED ORDER — SODIUM CHLORIDE 0.9 % IV SOLN
200.0000 mg | Freq: Once | INTRAVENOUS | Status: AC
  Administered 2021-03-14: 200 mg via INTRAVENOUS
  Filled 2021-03-14: qty 40

## 2021-03-14 MED ORDER — ORAL CARE MOUTH RINSE
15.0000 mL | Freq: Two times a day (BID) | OROMUCOSAL | Status: DC
  Administered 2021-03-14 – 2021-03-23 (×18): 15 mL via OROMUCOSAL

## 2021-03-14 MED ORDER — ACETAMINOPHEN 325 MG PO TABS
650.0000 mg | ORAL_TABLET | Freq: Four times a day (QID) | ORAL | Status: DC | PRN
  Administered 2021-03-15 – 2021-03-20 (×4): 650 mg via ORAL
  Filled 2021-03-14 (×4): qty 2

## 2021-03-14 MED ORDER — ENOXAPARIN SODIUM 100 MG/ML IJ SOSY
100.0000 mg | PREFILLED_SYRINGE | Freq: Two times a day (BID) | INTRAMUSCULAR | Status: DC
  Administered 2021-03-14 – 2021-03-15 (×2): 100 mg via SUBCUTANEOUS
  Filled 2021-03-14 (×2): qty 1

## 2021-03-14 MED ORDER — SODIUM CHLORIDE 0.9 % IV SOLN
100.0000 mg | Freq: Every day | INTRAVENOUS | Status: AC
  Administered 2021-03-15 – 2021-03-18 (×4): 100 mg via INTRAVENOUS
  Filled 2021-03-14 (×4): qty 20

## 2021-03-14 NOTE — Progress Notes (Signed)
ABG on hold due to pt in CT.

## 2021-03-14 NOTE — Progress Notes (Signed)
   03/14/21 1432  Assess: MEWS Score  Temp 99 F (37.2 C)  BP 140/81  Pulse Rate (!) 121  Resp 20  Level of Consciousness Alert  SpO2 95 %  O2 Device Room Air  Assess: MEWS Score  MEWS Temp 0  MEWS Systolic 0  MEWS Pulse 2  MEWS RR 0  MEWS LOC 0  MEWS Score 2  MEWS Score Color Yellow  Assess: if the MEWS score is Yellow or Red  Were vital signs taken at a resting state? Yes  Focused Assessment No change from prior assessment  Does the patient meet 2 or more of the SIRS criteria? No  MEWS guidelines implemented *See Row Information* Yes  Take Vital Signs  Increase Vital Sign Frequency  Yellow: Q 2hr X 2 then Q 4hr X 2, if remains yellow, continue Q 4hrs  Escalate  MEWS: Escalate Yellow: discuss with charge nurse/RN and consider discussing with provider and RRT  Notify: Charge Nurse/RN  Name of Charge Nurse/RN Notified Marissa, RN  Date Charge Nurse/RN Notified 03/14/21  Time Charge Nurse/RN Notified 1445  Notify: Provider  Provider Name/Title Hal Hope  Date Provider Notified 03/14/21  Time Provider Notified 1446  Notification Type Page  Notification Reason Change in status  Provider response See new orders  Date of Provider Response 03/14/21  Time of Provider Response 1450

## 2021-03-14 NOTE — H&P (Signed)
History and Physical    [name] DEBSKI Sr. PPI:[id number] DOB: 11-01-1950 DOA: 03/14/2021  PCP: [name], DO  Patient coming from: Home.  History obtained from ER physician and patient's caregiver.  Previous records.  Patient is encephalopathic.  Chief Complaint: Weakness not communicating.  HPI: [name] BABINGTON Sr. is a 70 y.o. male with history of dementia, atrial fibrillation, ulcerative colitis, depression was brought to the ER patient was found to be increasingly confused not communicating and not himself for the last 3 days.  Patient had come to the ER 3 days ago with confusion and weakness at that time patient was diagnosed with COVID and was discharged on oral antivirals (was not taking since patient has not been communicating and not following commands.  Given the worsening weakness encephalopathic patient was brought back to the ER.  ED Course: In the ER patient is confused not following commands CT head was unremarkable chest x-ray does not show anything acute.  Labs are largely at baseline.  ABG does not show any hypercarbia.  Given the worsening symptoms patient admitted for further observation.  Review of Systems: As per HPI, rest all negative.   Past Medical History:  Diagnosis Date   Acute delirium 07/14/2020   Adenomatous colon polyp 2016   Allergy    AMS (altered mental status) 07/13/2020   Anxiety    Bell palsy    resolved   Bell's palsy 09/14/2020   CHF (congestive heart failure) (HCC)    Chicken pox    Concussion with no loss of consciousness 07/07/2018   Depression    Diet-controlled diabetes mellitus ([place])    Eating disorder    Essential hypertension 11/29/2017   Gastro-esophageal reflux disease without esophagitis 09/14/2020   GERD (gastroesophageal reflux disease)    History of vitamin D deficiency    Hyperlipidemia    Hypertension    Hypokalemia    Memory loss    MI (myocardial infarction) ([place])    per pt    Neoplasm of uncertain behavior of skin  09/14/2020   Personal history of colonic polyps 09/14/2020   Ulcerative colitis ([place]) 2005-2006   severe     Past Surgical History:  Procedure Laterality Date   COLONOSCOPY WITH PROPOFOL N/A 07/15/2014   Procedure: COLONOSCOPY WITH PROPOFOL;  Surgeon: [name], MD;  Location: [facility];  Service: Endoscopy;  Laterality: N/A;   COLONOSCOPY WITH PROPOFOL N/A 08/22/2015   Procedure: COLONOSCOPY WITH PROPOFOL;  Surgeon: [name], MD;  Location: [facility];  Service: Endoscopy;  Laterality: N/A;   ORIF ANKLE FRACTURE  02/27/2012   Procedure: OPEN REDUCTION INTERNAL FIXATION (ORIF) ANKLE FRACTURE;  Surgeon: Colin Rhein, MD;  Location: [facility];  Service: Orthopedics;  Laterality: Left;  ORIF left bimalleolar ankle fracture   SHOULDER SURGERY     LEFT   SIGMOIDOSCOPY  10/08/2018   Externa and Internal Hmorrhoids. Tubular and Tublovillous adenoma removed from transverse colon. Inflammatory pseudopolyps, negative for dysplasia   TONSILLECTOMY     UPPER GASTROINTESTINAL ENDOSCOPY       reports that he quit smoking about 39 years ago. He has a 19.00 pack-year smoking history. He quit smokeless tobacco use about 32 years ago. He reports current alcohol use. He reports that he does not use drugs.  Allergies  Allergen Reactions   Losartan Potassium-Hctz Diarrhea   Nsaids Other (See Comments)    On eliquis.    Zoloft [Sertraline Hcl] Diarrhea   Benzodiazepines Other (See Comments)  Agitation.    Family History  Problem Relation Age of Onset   Dementia Mother    Hypertension Mother    Early death Father 93       drowning    Post-traumatic stress disorder Brother    Neuropathy Brother     Prior to Admission medications   Medication Sig Start Date End Date Taking? Authorizing Provider  acetaminophen (TYLENOL) 500 MG tablet Take 500 mg by mouth every 6 (six) hours as needed for mild pain, headache or fever.   Yes [provider]  apixaban  (ELIQUIS) 5 MG TABS tablet Take 1 tablet (5 mg total) by mouth 2 (two) times daily. 03/01/21 02/24/22 Yes [name], [name], DO  atorvastatin (LIPITOR) 20 MG tablet Take 1 tablet (20 mg total) by mouth daily. Patient taking differently: Take 20 mg by mouth every evening. 03/01/21  Yes [name], [name], DO  azaTHIOprine (IMURAN) 50 MG tablet Take 200 mg by mouth every morning.   Yes [provider]  divalproex (DEPAKOTE SPRINKLE) 125 MG capsule Take 6 capsules (750 mg total) by mouth every 12 (twelve) hours. 09/14/20  Yes [name], [name], DO  docusate sodium (COLACE) 100 MG capsule Take 1 capsule (100 mg total) by mouth 2 (two) times daily. 03/01/21 03/01/22 Yes [name], [name], DO  DULoxetine (CYMBALTA) 60 MG capsule Take 1 capsule (60 mg total) by mouth daily. 03/01/21  Yes [name], [name], DO  furosemide (LASIX) 40 MG tablet Take 1 tablet (40 mg total) by mouth 2 (two) times a week. Patient taking differently: Take 40 mg by mouth 2 (two) times a week. On Thursdays and Sundays 03/02/21  Yes [name], [name], DO  levocetirizine (XYZAL) 5 MG tablet Take 5 mg by mouth every evening.   Yes [provider]  levothyroxine (SYNTHROID) 50 MCG tablet Take 1 tablet (50 mcg total) by mouth daily before breakfast. 03/01/21 05/30/21 Yes [name], [name], DO  Melatonin 10 MG TABS Take 10 mg by mouth at bedtime.   Yes [provider]  molnupiravir EUA 200 mg CAPS Take 4 capsules (800 mg total) by mouth 2 (two) times daily for 5 days. 03/12/21 03/17/21 Yes Joy, Shawn C, PA-C  OLANZapine (ZYPREXA) 10 MG tablet 1/2 tab in the morning and 1.5 tabs QHS Patient taking differently: Take 5 mg by mouth daily. 12/19/20  Yes [name], [name], DO  potassium chloride (KLOR-CON M10) 10 MEQ tablet Take 1 tablet (10 mEq total) by mouth daily. 03/01/21  Yes [name], [name], DO  tamsulosin (FLOMAX) 0.4 MG CAPS capsule Take 1 capsule (0.4 mg total) by mouth daily. 03/01/21  Yes [name], [name], DO  traZODone (DESYREL) 50 MG  tablet TAKE 1 TABLET BY MOUTH EVERYDAY AT BEDTIME Patient taking differently: Take 50 mg by mouth at bedtime. 01/09/21  Yes Cameron Sprang, MD  polyethylene glycol (MIRALAX) 17 g packet Take 17 g by mouth daily. Patient not taking: No sig reported 03/02/21   Howard Pouch A, DO    Physical Exam: Constitutional: Moderately built and nourished. Vitals:   03/14/21 1130 03/14/21 1200 03/14/21 1230 03/14/21 1432  BP: (!) 129/105 (!) 139/97 126/71 140/81  Pulse: (!) 113 (!) 118 (!) 109 (!) 121  Resp: 20 (!) 22 20 20   Temp:    99 F (37.2 C)  TempSrc:    Axillary  SpO2: 92% 93% 92% 95%   Eyes: Anicteric no pallor. ENMT: No discharge from the ears eyes nose and mouth. Neck: No  mass felt.  No neck rigidity. Respiratory: No rhonchi or crepitations. Cardiovascular: S1-S2 heard. Abdomen: Soft nontender bowel sounds present. Musculoskeletal: No edema. Skin: No rash. Neurologic: Patient is alert and awake but not following commands appears completely confused pupils are reacting to light. Psychiatric: Appears confused.   Labs on Admission: I have personally reviewed following labs and imaging studies  CBC: Recent Labs  Lab 03/12/21 1939 03/14/21 1058  WBC 4.7 4.2  NEUTROABS 4.0 3.3  HGB 12.5* 14.0  HCT 37.5* 40.4  MCV 107.8* 104.1*  PLT 157 798*   Basic Metabolic Panel: Recent Labs  Lab 03/12/21 1939 03/14/21 1058  NA 132* 131*  K 4.3 SPECIMEN HEMOLYZED. HEMOLYSIS MAY AFFECT INTEGRITY OF RESULTS.  CL 96* 95*  CO2 27 28  GLUCOSE 100* 94  BUN 5* 8  CREATININE 0.66 0.68  CALCIUM 8.5* 8.4*   GFR: Estimated Creatinine Clearance: 96.8 mL/min (by C-G formula based on SCr of 0.68 mg/dL). Liver Function Tests: Recent Labs  Lab 03/12/21 1939 03/14/21 1058  AST 24 86*  ALT 13 21  ALKPHOS 35* 33*  BILITOT 0.8 1.9*  PROT 5.5* 6.2*  ALBUMIN 3.3* 3.1*   No results for input(s): LIPASE, AMYLASE in the last 168 hours. Recent Labs  Lab 03/14/21 1104  AMMONIA 32    Coagulation Profile: Recent Labs  Lab 03/12/21 1939  INR 1.2   Cardiac Enzymes: No results for input(s): CKTOTAL, CKMB, CKMBINDEX, TROPONINI in the last 168 hours. BNP (last 3 results) Recent Labs    12/07/20 1223  PROBNP 87.0   HbA1C: No results for input(s): HGBA1C in the last 72 hours. CBG: Recent Labs  Lab 03/14/21 1114  GLUCAP 92   Lipid Profile: Recent Labs    03/14/21 1111  TRIG 67   Thyroid Function Tests: No results for input(s): TSH, T4TOTAL, FREET4, T3FREE, THYROIDAB in the last 72 hours. Anemia Panel: Recent Labs    03/14/21 1111  FERRITIN 360*   Urine analysis:    Component Value Date/Time   COLORURINE YELLOW 03/14/2021 1155   APPEARANCEUR CLEAR 03/14/2021 1155   LABSPEC 1.011 03/14/2021 1155   PHURINE 6.0 03/14/2021 1155   GLUCOSEU NEGATIVE 03/14/2021 1155   HGBUR NEGATIVE 03/14/2021 1155   [place] 03/14/2021 1155   BILIRUBINUR negative 01/06/2021 1407   BILIRUBINUR negative 01/03/2018 0959   KETONESUR 20 (A) 03/14/2021 1155   PROTEINUR NEGATIVE 03/14/2021 1155   UROBILINOGEN 4.0 (A) 01/06/2021 1407   NITRITE NEGATIVE 03/14/2021 1155   LEUKOCYTESUR NEGATIVE 03/14/2021 1155   Sepsis Labs: @LABRCNTIP (procalcitonin:4,lacticidven:4) ) Recent Results (from the past 240 hour(s))  Blood Culture (routine x 2)     Status: None (Preliminary result)   Collection Time: 03/12/21  7:35 PM   Specimen: BLOOD  Result Value Ref Range Status   Specimen Description   Final    BLOOD LEFT ANTECUBITAL Performed at [facility], [place] [address] [place], [place] [address]    Special Requests   Final    BOTTLES DRAWN AEROBIC AND ANAEROBIC Blood Culture results may not be optimal due to an excessive volume of blood received in culture bottles Performed at [facility] [address], [place], [place] [address]    Culture   Final    NO GROWTH 1 DAY Performed at [facility], [place] [address],  [place], [place] [address]    Report Status PENDING  Incomplete  Blood Culture (routine x 2)     Status: None (Preliminary result)  Collection Time: 03/12/21  7:38 PM   Specimen: BLOOD  Result Value Ref Range Status   Specimen Description   Final    BLOOD RIGHT ANTECUBITAL Performed at [facility] [address], [place], [place] [address]    Special Requests   Final    BOTTLES DRAWN AEROBIC AND ANAEROBIC Blood Culture adequate volume Performed at [facility] [address], [place], [place] [address]    Culture   Final    NO GROWTH 1 DAY Performed at [facility], [place] [address], [place], [place] [address]    Report Status PENDING  Incomplete  Resp Panel by RT-PCR (Flu A&B, Covid) Nasopharyngeal Swab     Status: Abnormal   Collection Time: 03/12/21  7:46 PM   Specimen: Nasopharyngeal Swab; Nasopharyngeal(NP) swabs in vial transport medium  Result Value Ref Range Status   SARS Coronavirus 2 by RT PCR POSITIVE (A) NEGATIVE Final    Comment: RESULT CALLED TO, READ BACK BY AND VERIFIED WITH: REBECCA GOODWIN AT 2053 ON 03/12/21 BY MAJ (NOTE) SARS-CoV-2 target nucleic acids are DETECTED.  The SARS-CoV-2 RNA is generally detectable in upper respiratory specimens during the acute phase of infection. Positive results are indicative of the presence of the identified virus, but do not rule out bacterial infection or co-infection with other pathogens not detected by the test. Clinical correlation with patient history and other diagnostic information is necessary to determine patient infection status. The expected result is Negative.  Fact Sheet for Patients: EntrepreneurPulse.com.au  Fact Sheet for Healthcare Providers: IncredibleEmployment.be  This test is not yet approved or cleared by the [place] FDA and  has been authorized for detection and/or diagnosis of SARS-CoV-2 by FDA under an Emergency  Use Authorization (EUA).  This EUA will remain in effect (meaning this te st can be used) for the duration of  the COVID-19 declaration under Section 564(b)(1) of the Act, 21 U.S.C. section 360bbb-3(b)(1), unless the authorization is terminated or revoked sooner.     Influenza A by PCR NEGATIVE NEGATIVE Final   Influenza B by PCR NEGATIVE NEGATIVE Final    Comment: (NOTE) The Xpert Xpress SARS-CoV-2/FLU/RSV plus assay is intended as an aid in the diagnosis of influenza from Nasopharyngeal swab specimens and should not be used as a sole basis for treatment. Nasal washings and aspirates are unacceptable for Xpert Xpress SARS-CoV-2/FLU/RSV testing.  Fact Sheet for Patients: EntrepreneurPulse.com.au  Fact Sheet for Healthcare Providers: IncredibleEmployment.be  This test is not yet approved or cleared by the [place] FDA and has been authorized for detection and/or diagnosis of SARS-CoV-2 by FDA under an Emergency Use Authorization (EUA). This EUA will remain in effect (meaning this test can be used) for the duration of the COVID-19 declaration under Section 564(b)(1) of the Act, 21 U.S.C. section 360bbb-3(b)(1), unless the authorization is terminated or revoked.  Performed at [facility], [place] [address], [place], [place] [address]   Urine culture     Status: None   Collection Time: 03/12/21  8:09 PM   Specimen: In/Out Cath Urine  Result Value Ref Range Status   Specimen Description   Final    IN/OUT CATH URINE Performed at [facility] [address], [place], [place] [address]    Special Requests   Final    NONE Performed at [facility], [place] [address], [place], [place] [address]    Culture   Final    NO GROWTH Performed  at [facility], [place] [address] [place], [place] [address]    Report Status 03/14/2021 FINAL  Final     Radiological Exams on Admission: CT  Head Wo Contrast  Result Date: 03/14/2021 CLINICAL DATA:  Mental status change. EXAM: CT HEAD WITHOUT CONTRAST TECHNIQUE: Contiguous axial images were obtained from the base of the skull through the vertex without intravenous contrast. COMPARISON:  March 10, 2021. FINDINGS: Brain: No evidence of acute large vascular territory infarction, hemorrhage, hydrocephalus, extra-axial collection or mass lesion/mass effect. Similar cerebral atrophy with ex vacuo ventricular dilation. Similar patchy white matter hypoattenuation, nonspecific but most likely related to chronic microvascular ischemic disease. Vascular: No hyperdense vessel identified. Calcific atherosclerosis. Skull: No acute fracture. Sinuses/Orbits: Visualized sinuses are clear. No acute orbital findings. Other: No mastoid effusions. IMPRESSION: 1. No evidence of acute intracranial abnormality. 2. Similar chronic microvascular ischemic disease and atrophy. Electronically Signed   By: Margaretha Sheffield MD   On: 03/14/2021 12:16   CT Head Wo Contrast  Result Date: 03/12/2021 CLINICAL DATA:  Mental status change for unknown cause. History of advanced Alzheimer's. EXAM: CT HEAD WITHOUT CONTRAST TECHNIQUE: Contiguous axial images were obtained from the base of the skull through the vertex without intravenous contrast. COMPARISON:  01/10/2021 . FINDINGS: Brain: No evidence of acute infarction, hemorrhage, hydrocephalus, extra-axial collection or mass lesion/mass effect. Diffuse cerebral atrophy. Ventricular dilatation likely due to central atrophy. Low-attenuation changes in the deep white matter likely due to small vessel ischemia. Vascular: Moderate intracranial arterial vascular calcifications. Skull: Calvarium appears intact. Sinuses/Orbits: Paranasal sinuses and mastoid air cells are clear. Other: None. IMPRESSION: No acute intracranial abnormalities. Chronic atrophy and small vessel ischemic changes. Electronically Signed   By: Lucienne Capers M.D.   On:  03/12/2021 21:04   DG Chest Portable 1 View  Result Date: 03/14/2021 CLINICAL DATA:  Altered mental status.  COVID-19 positive on Sunday. EXAM: PORTABLE CHEST 1 VIEW COMPARISON:  03/12/2021 FINDINGS: Midline trachea. Moderate cardiomegaly. Superior mediastinal soft tissue thickening is chronic and attributed to AP portable technique and low lung volumes. No pleural effusion or pneumothorax. No congestive failure. There may be mild left base atelectasis or scarring. No well-defined lobar consolidation. IMPRESSION: No acute cardiopulmonary disease. Cardiomegaly without congestive failure. Electronically Signed   By: Abigail Miyamoto M.D.   On: 03/14/2021 11:29   DG Chest Port 1 View  Result Date: 03/12/2021 CLINICAL DATA:  Urosepsis.  Altered mental status. EXAM: PORTABLE CHEST 1 VIEW COMPARISON:  01/10/2021 FINDINGS: Lordotic positioning noted. Heart size is stable lung for differences in positioning. Both lungs are clear. IMPRESSION: No active disease. Electronically Signed   By: Marlaine Hind M.D.   On: 03/12/2021 20:02    EKG: Independently reviewed.  A. fib rate around 107 bpm.   Assessment/Plan Principal Problem:   Acute encephalopathy Active Problems:   Dementia (HCC)   Hypothyroidism   Chronic ulcerative pancolitis (HCC)   Chronic diastolic CHF (congestive heart failure) (HCC)   Paroxysmal atrial fibrillation (HCC)   Weakness    Acute encephalopathy in the setting of COVID infection could be COVID-related.  However since patient's symptoms have not improved over the last 3 days we will get MRI brain and EEG.  I do not think patient is having active seizures at this time.  We will also check Depakote levels and ammonia levels. COVID infection not hypoxic at this time.  We will closely monitor for any worsening respiratory symptoms or hypoxia.  I have placed patient on remdesivir IV.  Hypothyroidism we will keep patient IV Synthroid and patient can swallow. History of A. fib we will keep  patient on heparin until patient can follow-up Eliquis. History of ulcerative colitis on Imuran which is on hold since patient cannot swallow at this time progressively. History of depression on Zyprexa and Depakote which are on hold since patient cannot swallow.  Since patient has COVID infection and encephalopathic will need close monitoring for any further worsening inpatient status.   I have kept patient n.p.o. until we get swallow evaluation.   DVT prophylaxis: Heparin. Code Status: DNR. Family Communication: Patient's caregiver. Disposition Plan: To be determined. Consults called: Speech therapy. Admission status: Inpatient.   Rise Patience MD [facility] Pager [phone].  If 7PM-7AM, please contact night-coverage [email or web address] Password [email or web address]  03/14/2021, 2:59 PM

## 2021-03-14 NOTE — ED Provider Notes (Signed)
[place] DEPT Provider Note   CSN: [id number] Arrival date & time: 03/14/21  1033  LEVEL 5 CAVEAT - DEMENTIA   History Chief Complaint  Patient presents with   Covid Positive   Altered Mental Status    DEMPSY [name] Sr. is a 70 y.o. male.  HPI 70 year old male presents with worsening altered mental status and weakness.  He was diagnosed with COVID-19 2 days ago in this emergency department.  I discussed with the wife and son and since being discharged 2 days ago he has been very weak, unable to walk on his own, and lying in bed.  At 1 point he fell to the ground and could not get up on his own.  He had to be helped up which is not typical for him.  He is not speaking as much and is not eating and drinking and is hard to get meds into them.  Due to this EMS was called to bring him back to the hospital.  He has not been able to take the [facility] prescribed.   Past Medical History:  Diagnosis Date   Acute delirium 07/14/2020   Adenomatous colon polyp 2016   Allergy    AMS (altered mental status) 07/13/2020   Anxiety    Bell palsy    resolved   Bell's palsy 09/14/2020   CHF (congestive heart failure) (HCC)    Chicken pox    Concussion with no loss of consciousness 07/07/2018   Depression    Diet-controlled diabetes mellitus ([place])    Eating disorder    Essential hypertension 11/29/2017   Gastro-esophageal reflux disease without esophagitis 09/14/2020   GERD (gastroesophageal reflux disease)    History of vitamin D deficiency    Hyperlipidemia    Hypertension    Hypokalemia    Memory loss    MI (myocardial infarction) ([place])    per pt    Neoplasm of uncertain behavior of skin 09/14/2020   Personal history of colonic polyps 09/14/2020   Ulcerative colitis ([place]) 2005-2006   severe     Patient Active Problem List   Diagnosis Date Noted   Weakness 03/14/2021   Paroxysmal atrial fibrillation ([place]) 01/11/2021   Chronic diastolic CHF (congestive  heart failure) ([place]) 61/44/3154   Acute metabolic encephalopathy 00/86/7619   Lower extremity edema 12/09/2020   Benign prostatic hyperplasia 09/15/2020   Chronic ulcerative pancolitis ([place]) 09/14/2020   Hypercholesterolemia 09/14/2020   Hypothyroidism    Vitamin B 12 deficiency    On statin therapy 05/26/2019   Acquired thrombophilia ([place]) 05/26/2019   QT prolongation 02/19/2019   Aortic atherosclerosis ([place]) 02/18/2019   Carotid artery calcification 02/18/2019   Cardiomegaly 02/18/2019   Dementia ([place]) 02/17/2019   Hypokalemia 02/17/2019   GAD (generalized anxiety disorder) 05/14/2018   Insomnia 11/29/2017   Moderate obstructive sleep apnea 11/08/2017   Morbid obesity ([place]) 11/08/2017   Major depression, recurrent, chronic ([place]) 09/18/2017    Past Surgical History:  Procedure Laterality Date   COLONOSCOPY WITH PROPOFOL N/A 07/15/2014   Procedure: COLONOSCOPY WITH PROPOFOL;  Surgeon: [name], MD;  Location: [facility];  Service: Endoscopy;  Laterality: N/A;   COLONOSCOPY WITH PROPOFOL N/A 08/22/2015   Procedure: COLONOSCOPY WITH PROPOFOL;  Surgeon: [name], MD;  Location: [facility];  Service: Endoscopy;  Laterality: N/A;   ORIF ANKLE FRACTURE  02/27/2012   Procedure: OPEN REDUCTION INTERNAL FIXATION (ORIF) ANKLE FRACTURE;  Surgeon: Colin Rhein, MD;  Location: [place];  Service: Orthopedics;  Laterality: Left;  ORIF left bimalleolar ankle fracture   SHOULDER SURGERY     LEFT   SIGMOIDOSCOPY  10/08/2018   Externa and Internal Hmorrhoids. Tubular and Tublovillous adenoma removed from transverse colon. Inflammatory pseudopolyps, negative for dysplasia   TONSILLECTOMY     UPPER GASTROINTESTINAL ENDOSCOPY         Family History  Problem Relation Age of Onset   Dementia Mother    Hypertension Mother    Early death Father 62       drowning    Post-traumatic stress disorder Brother    Neuropathy Brother     Social History    Tobacco Use   Smoking status: Former    Packs/day: 1.00    Years: 19.00    Pack years: 19.00    Types: Cigarettes    Quit date: 02/25/1982    Years since quitting: 39.0   Smokeless tobacco: Former    Quit date: 1990  Scientific laboratory technician Use: Never used  Substance Use Topics   Alcohol use: Yes    Comment: occ   Drug use: No    Home Medications Prior to Admission medications   Medication Sig Start Date End Date Taking? Authorizing Provider  acetaminophen (TYLENOL) 500 MG tablet Take 500 mg by mouth every 6 (six) hours as needed for mild pain, headache or fever.   Yes [provider]  apixaban (ELIQUIS) 5 MG TABS tablet Take 1 tablet (5 mg total) by mouth 2 (two) times daily. 03/01/21 02/24/22 Yes [name], [name], DO  atorvastatin (LIPITOR) 20 MG tablet Take 1 tablet (20 mg total) by mouth daily. Patient taking differently: Take 20 mg by mouth every evening. 03/01/21  Yes [name], [name], DO  azaTHIOprine (IMURAN) 50 MG tablet Take 200 mg by mouth every morning.   Yes [provider]  divalproex (DEPAKOTE SPRINKLE) 125 MG capsule Take 6 capsules (750 mg total) by mouth every 12 (twelve) hours. 09/14/20  Yes [name], [name], DO  docusate sodium (COLACE) 100 MG capsule Take 1 capsule (100 mg total) by mouth 2 (two) times daily. 03/01/21 03/01/22 Yes [name], [name], DO  DULoxetine (CYMBALTA) 60 MG capsule Take 1 capsule (60 mg total) by mouth daily. 03/01/21  Yes [name], [name], DO  furosemide (LASIX) 40 MG tablet Take 1 tablet (40 mg total) by mouth 2 (two) times a week. Patient taking differently: Take 40 mg by mouth 2 (two) times a week. On Thursdays and Sundays 03/02/21  Yes [name], [name], DO  levocetirizine (XYZAL) 5 MG tablet Take 5 mg by mouth every evening.   Yes [provider]  levothyroxine (SYNTHROID) 50 MCG tablet Take 1 tablet (50 mcg total) by mouth daily before breakfast. 03/01/21 05/30/21 Yes [name], [name], DO  Melatonin 10 MG TABS Take 10 mg by  mouth at bedtime.   Yes [provider]  molnupiravir EUA 200 mg CAPS Take 4 capsules (800 mg total) by mouth 2 (two) times daily for 5 days. 03/12/21 03/17/21 Yes Joy, Shawn C, PA-C  OLANZapine (ZYPREXA) 10 MG tablet 1/2 tab in the morning and 1.5 tabs QHS Patient taking differently: Take 5 mg by mouth daily. 12/19/20  Yes [name], [name], DO  potassium chloride (KLOR-CON M10) 10 MEQ tablet Take 1 tablet (10 mEq total) by mouth daily. 03/01/21  Yes [name], [name], DO  tamsulosin (FLOMAX) 0.4 MG CAPS capsule Take 1 capsule (0.4 mg total) by mouth daily. 03/01/21  Yes [name], [name], DO  traZODone (DESYREL) 50 MG tablet TAKE 1 TABLET BY MOUTH EVERYDAY AT BEDTIME Patient taking differently: Take 50 mg by mouth at bedtime. 01/09/21  Yes Cameron Sprang, MD  polyethylene glycol (MIRALAX) 17 g packet Take 17 g by mouth daily. Patient not taking: No sig reported 03/02/21   [name], [name], DO    Allergies    Losartan potassium-hctz, Nsaids, Zoloft [sertraline hcl], and Benzodiazepines  Review of Systems   Review of Systems  Unable to perform ROS: Dementia   Physical Exam Updated Vital Signs BP 126/71   Pulse (!) 109   Temp 98.9 F (37.2 C) (Oral)   Resp 20   SpO2 92%   Physical Exam Vitals and nursing note reviewed.  Constitutional:      Appearance: He is well-developed.  HENT:     Head: Normocephalic and atraumatic.     Right Ear: External ear normal.     Left Ear: External ear normal.     Nose: Nose normal.  Eyes:     General:        Right eye: No discharge.        Left eye: No discharge.     Pupils: Pupils are equal, round, and reactive to light.  Cardiovascular:     Rate and Rhythm: Normal rate and regular rhythm.     Heart sounds: Normal heart sounds.  Pulmonary:     Effort: Pulmonary effort is normal.     Breath sounds: Normal breath sounds. No wheezing.  Abdominal:     Palpations: Abdomen is soft.     Tenderness: There is no abdominal tenderness.  Musculoskeletal:      Cervical back: Neck supple. No rigidity.  Skin:    General: Skin is warm and dry.  Neurological:     Mental Status: He is lethargic.     Comments: Patient will open his eyes to touch and voice though he quickly falls asleep.  He is yawning.  He does briefly follow commands by squeezing my hands bilaterally and pulling to resistance.  However when I try to lift his legs off the stretcher they fall back down and he cannot keep them up in the air.  Psychiatric:        Mood and Affect: Mood is not anxious.    ED Results / Procedures / Treatments   Labs (all labs ordered are listed, but only abnormal results are displayed) Labs Reviewed  BLOOD GAS, ARTERIAL - Abnormal; Notable for the following components:      Result Value   pH, Arterial 7.474 (*)    Acid-Base Excess 3.4 (*)    All other components within normal limits  COMPREHENSIVE METABOLIC PANEL - Abnormal; Notable for the following components:   Sodium 131 (*)    Chloride 95 (*)    Calcium 8.4 (*)    Total Protein 6.2 (*)    Albumin 3.1 (*)    AST 86 (*)    Alkaline Phosphatase 33 (*)    Total Bilirubin 1.9 (*)    All other components within normal limits  URINALYSIS, ROUTINE W REFLEX MICROSCOPIC - Abnormal; Notable for the following components:   Ketones, ur 20 (*)    All other components within normal limits  CBC WITH DIFFERENTIAL/PLATELET - Abnormal; Notable for the following components:   RBC 3.88 (*)    MCV 104.1 (*)    MCH 36.1 (*)    RDW 16.0 (*)    Platelets 138 (*)  Lymphs Abs 0.3 (*)    All other components within normal limits  D-DIMER, QUANTITATIVE - Abnormal; Notable for the following components:   D-Dimer, Quant 0.99 (*)    All other components within normal limits  FERRITIN - Abnormal; Notable for the following components:   Ferritin 360 (*)    All other components within normal limits  C-REACTIVE PROTEIN - Abnormal; Notable for the following components:   CRP 4.5 (*)    All other components  within normal limits  CULTURE, BLOOD (ROUTINE X 2)  CULTURE, BLOOD (ROUTINE X 2)  VALPROIC ACID LEVEL  AMMONIA  LACTIC ACID, PLASMA  PROCALCITONIN  LACTATE DEHYDROGENASE  TRIGLYCERIDES  FIBRINOGEN  LACTIC ACID, PLASMA  POTASSIUM  CBG MONITORING, ED    EKG EKG Interpretation  Date/Time:  Tuesday March 14 2021 11:16:53 EDT Ventricular Rate:  107 PR Interval:    QRS Duration: 114 QT Interval:  360 QTC Calculation: 480 R Axis:   12 Text Interpretation: Atrial fibrillation with rapid ventricular response Abnormal ECG Confirmed by Sherwood Gambler [phone]) on 03/14/2021 2:13:46 PM  Radiology CT Head Wo Contrast  Result Date: 03/14/2021 CLINICAL DATA:  Mental status change. EXAM: CT HEAD WITHOUT CONTRAST TECHNIQUE: Contiguous axial images were obtained from the base of the skull through the vertex without intravenous contrast. COMPARISON:  March 10, 2021. FINDINGS: Brain: No evidence of acute large vascular territory infarction, hemorrhage, hydrocephalus, extra-axial collection or mass lesion/mass effect. Similar cerebral atrophy with ex vacuo ventricular dilation. Similar patchy white matter hypoattenuation, nonspecific but most likely related to chronic microvascular ischemic disease. Vascular: No hyperdense vessel identified. Calcific atherosclerosis. Skull: No acute fracture. Sinuses/Orbits: Visualized sinuses are clear. No acute orbital findings. Other: No mastoid effusions. IMPRESSION: 1. No evidence of acute intracranial abnormality. 2. Similar chronic microvascular ischemic disease and atrophy. Electronically Signed   By: Margaretha Sheffield MD   On: 03/14/2021 12:16   CT Head Wo Contrast  Result Date: 03/12/2021 CLINICAL DATA:  Mental status change for unknown cause. History of advanced Alzheimer's. EXAM: CT HEAD WITHOUT CONTRAST TECHNIQUE: Contiguous axial images were obtained from the base of the skull through the vertex without intravenous contrast. COMPARISON:  01/10/2021 . FINDINGS:  Brain: No evidence of acute infarction, hemorrhage, hydrocephalus, extra-axial collection or mass lesion/mass effect. Diffuse cerebral atrophy. Ventricular dilatation likely due to central atrophy. Low-attenuation changes in the deep white matter likely due to small vessel ischemia. Vascular: Moderate intracranial arterial vascular calcifications. Skull: Calvarium appears intact. Sinuses/Orbits: Paranasal sinuses and mastoid air cells are clear. Other: None. IMPRESSION: No acute intracranial abnormalities. Chronic atrophy and small vessel ischemic changes. Electronically Signed   By: Lucienne Capers M.D.   On: 03/12/2021 21:04   DG Chest Portable 1 View  Result Date: 03/14/2021 CLINICAL DATA:  Altered mental status.  COVID-19 positive on Sunday. EXAM: PORTABLE CHEST 1 VIEW COMPARISON:  03/12/2021 FINDINGS: Midline trachea. Moderate cardiomegaly. Superior mediastinal soft tissue thickening is chronic and attributed to AP portable technique and low lung volumes. No pleural effusion or pneumothorax. No congestive failure. There may be mild left base atelectasis or scarring. No well-defined lobar consolidation. IMPRESSION: No acute cardiopulmonary disease. Cardiomegaly without congestive failure. Electronically Signed   By: Abigail Miyamoto M.D.   On: 03/14/2021 11:29   DG Chest Port 1 View  Result Date: 03/12/2021 CLINICAL DATA:  Urosepsis.  Altered mental status. EXAM: PORTABLE CHEST 1 VIEW COMPARISON:  01/10/2021 FINDINGS: Lordotic positioning noted. Heart size is stable lung for differences in positioning. Both lungs are clear.  IMPRESSION: No active disease. Electronically Signed   By: Marlaine Hind M.D.   On: 03/12/2021 20:02    Procedures Procedures   Medications Ordered in ED Medications  sodium chloride 0.9 % bolus 1,000 mL (0 mLs Intravenous Stopped 03/14/21 1219)    ED Course  I have reviewed the triage vital signs and the nursing notes.  Pertinent labs & imaging results that were available during  my care of the patient were reviewed by me and considered in my medical decision making (see chart for details).    MDM Rules/Calculators/A&P                          Patient is lethargic but awakens and opens eyes and follows some basic commands when prompted.  There is mental status changes worse than his typical.  He is also much weaker and not eating and drinking appropriately.  Likely all due to his acute COVID-19 infection.  Labs overall unremarkable.  Given the recent fall, CT head was obtained which is negative.  He is not febrile.  No meningismus and I do not think he has an acute CNS infection.  Discussed with Dr. Hal Hope for admission as he is not tolerating p.o. Final Clinical Impression(s) / ED Diagnoses Final diagnoses:  Altered mental status, unspecified altered mental status type    Rx / DC Orders ED Discharge Orders     None        Sherwood Gambler, MD 03/14/21 1416

## 2021-03-14 NOTE — ED Triage Notes (Signed)
EMS reports from home, Pt Dx with Covid Sunday, family reports AMS for several days, family states poor oral intake. Hx of Alzheimer's, family states unable to care for Pt.  BP 129/84 HR 116 RR 16 Sp02 90 RA  CBG 84  20ga L hand

## 2021-03-14 NOTE — Progress Notes (Signed)
Patient with full upper dentures and partial lower dentures, removed these at sons request and placed in pink denture cup.  Mouth care performed, patient coughing up thick sputum. Suctioned mouth as well.

## 2021-03-14 NOTE — ED Notes (Signed)
Pt placed on condom cath due to incontinence and specimen collection. Pt soiled in previous brief. Pt cleaned and dried, new brief placed.

## 2021-03-14 NOTE — Progress Notes (Signed)
Flutter valve not given due to pt not able to do.

## 2021-03-15 ENCOUNTER — Inpatient Hospital Stay (HOSPITAL_COMMUNITY)
Admission: EM | Admit: 2021-03-15 | Discharge: 2021-03-15 | Disposition: A | Discharge status: Home / Self Care | Payer: Medicare HMO | Source: Home / Self Care | Attending: Internal Medicine | Admitting: Internal Medicine

## 2021-03-15 DIAGNOSIS — G9340 Encephalopathy, unspecified: Secondary | ICD-10-CM | POA: Diagnosis not present

## 2021-03-15 LAB — CBC WITH DIFFERENTIAL/PLATELET
Abs Immature Granulocytes: 0.03 10*3/uL (ref 0.00–0.07)
Basophils Absolute: 0 10*3/uL (ref 0.0–0.1)
Basophils Relative: 1 %
Eosinophils Absolute: 0 10*3/uL (ref 0.0–0.5)
Eosinophils Relative: 0 %
HCT: 42.1 % (ref 39.0–52.0)
Hemoglobin: 14.5 g/dL (ref 13.0–17.0)
Immature Granulocytes: 1 %
Lymphocytes Relative: 14 %
Lymphs Abs: 0.5 10*3/uL — ABNORMAL LOW (ref 0.7–4.0)
MCH: 35.7 pg — ABNORMAL HIGH (ref 26.0–34.0)
MCHC: 34.4 g/dL (ref 30.0–36.0)
MCV: 103.7 fL — ABNORMAL HIGH (ref 80.0–100.0)
Monocytes Absolute: 0.6 10*3/uL (ref 0.1–1.0)
Monocytes Relative: 16 %
Neutro Abs: 2.5 10*3/uL (ref 1.7–7.7)
Neutrophils Relative %: 68 %
Platelets: 127 10*3/uL — ABNORMAL LOW (ref 150–400)
RBC: 4.06 MIL/uL — ABNORMAL LOW (ref 4.22–5.81)
RDW: 16 % — ABNORMAL HIGH (ref 11.5–15.5)
WBC: 3.7 10*3/uL — ABNORMAL LOW (ref 4.0–10.5)
nRBC: 0 % (ref 0.0–0.2)

## 2021-03-15 LAB — COMPREHENSIVE METABOLIC PANEL
ALT: 19 U/L (ref 0–44)
AST: 35 U/L (ref 15–41)
Albumin: 3 g/dL — ABNORMAL LOW (ref 3.5–5.0)
Alkaline Phosphatase: 31 U/L — ABNORMAL LOW (ref 38–126)
Anion gap: 11 (ref 5–15)
BUN: 10 mg/dL (ref 8–23)
CO2: 28 mmol/L (ref 22–32)
Calcium: 8.4 mg/dL — ABNORMAL LOW (ref 8.9–10.3)
Chloride: 94 mmol/L — ABNORMAL LOW (ref 98–111)
Creatinine, Ser: 0.7 mg/dL (ref 0.61–1.24)
GFR, Estimated: >60 mL/min (ref >60)
Glucose, Bld: 85 mg/dL (ref 70–99)
Potassium: 3.7 mmol/L (ref 3.5–5.1)
Sodium: 133 mmol/L — ABNORMAL LOW (ref 135–145)
Total Bilirubin: 1 mg/dL (ref 0.3–1.2)
Total Protein: 5.9 g/dL — ABNORMAL LOW (ref 6.5–8.1)

## 2021-03-15 LAB — GLUCOSE, CAPILLARY
Glucose-Capillary: 109 mg/dL — ABNORMAL HIGH (ref 70–99)
Glucose-Capillary: 82 mg/dL (ref 70–99)
Glucose-Capillary: 82 mg/dL (ref 70–99)
Glucose-Capillary: 83 mg/dL (ref 70–99)

## 2021-03-15 LAB — D-DIMER, QUANTITATIVE: D-Dimer, Quant: 0.57 ug/mL-FEU — ABNORMAL HIGH (ref 0.00–0.50)

## 2021-03-15 LAB — C-REACTIVE PROTEIN: CRP: 9.3 mg/dL — ABNORMAL HIGH (ref <1.0)

## 2021-03-15 MED ORDER — METOPROLOL TARTRATE 25 MG PO TABS
12.5000 mg | ORAL_TABLET | Freq: Two times a day (BID) | ORAL | Status: DC
  Administered 2021-03-15 – 2021-03-23 (×16): 12.5 mg via ORAL
  Filled 2021-03-15 (×16): qty 1

## 2021-03-15 MED ORDER — APIXABAN 5 MG PO TABS
5.0000 mg | ORAL_TABLET | Freq: Two times a day (BID) | ORAL | Status: DC
  Administered 2021-03-15 – 2021-03-23 (×16): 5 mg via ORAL
  Filled 2021-03-15 (×15): qty 1
  Filled 2021-03-15 (×2): qty 2

## 2021-03-15 MED ORDER — LEVOTHYROXINE SODIUM 50 MCG PO TABS
50.0000 ug | ORAL_TABLET | Freq: Every day | ORAL | Status: DC
  Administered 2021-03-16 – 2021-03-23 (×8): 50 ug via ORAL
  Filled 2021-03-15 (×8): qty 1

## 2021-03-15 MED ORDER — METOPROLOL TARTRATE 5 MG/5ML IV SOLN
5.0000 mg | Freq: Once | INTRAVENOUS | Status: AC
  Administered 2021-03-15: 5 mg via INTRAVENOUS
  Filled 2021-03-15: qty 5

## 2021-03-15 NOTE — Plan of Care (Signed)
  Problem: Education: Goal: Knowledge of General Education information will improve Description: Including pain rating scale, medication(s)/side effects and non-pharmacologic comfort measures Outcome: Not Progressing   Problem: Health Behavior/Discharge Planning: Goal: Ability to manage health-related needs will improve Outcome: Not Progressing   Problem: Clinical Measurements: Goal: Respiratory complications will improve Outcome: Not Progressing

## 2021-03-15 NOTE — Progress Notes (Signed)
PROGRESS NOTE    [name] WICHMANN Sr.  NLG:[id number] DOB: 1951/08/26 DOA: 03/14/2021 PCP: [name], DO   Brief Narrative:  This 70 years old male with PMH significant for dementia, atrial fibrillation, ulcerative colitis, depression brought in the ED after he was found increasingly confused at home.  Patient was not communicating and not himself for the last 3 days. Patient has come to the ER 3 days ago with confusion and weakness and at that time he was diagnosed with COVID and he was discharged home on oral antiviral medication. He has not been communicating and not following commands.  Due to worsening weakness and encephalopathy patient was brought back in the ED.  CT head is unremarkable.  MRI head is unremarkable,  no acute intracranial abnormality.  ABG does not show hypercarbia.  Patient is admitted for possible encephalopathy due to [place].  Assessment & Plan:   Principal Problem:   Acute encephalopathy Active Problems:   Dementia (HCC)   Hypothyroidism   Chronic ulcerative pancolitis (HCC)   Chronic diastolic CHF (congestive heart failure) (HCC)   Paroxysmal atrial fibrillation (HCC)   Weakness  Acute encephalopathy in the setting of COVID infection: Patient was brought in the ED 3 days ago,  found to have COVID and was discharged on antiviral medication. Its most likely related to COVID, patient symptoms has not improved in last 3 days. MRI: No acute intracranial abnormality.  EEG is pending.  ABGs no hypercarbia. Is less likely patient is having any active seizures at this time but will obtain EEG. UA unremarkable, chest x-ray unremarkable.  Follow blood cultures. Patient passed bedside swallow eval. Patient seems improved in his mental status.  COVID infection: Patient is not hypoxic at this time,  Closely monitor for worsening respiratory symptoms.   We will continue IV remdesivir.  Hypothyroidism :  Continue levothyroxine.  History of A. Fib : Continue  Eliquis. Start metoprolol 12.5 mg twice daily.  History of ulcerative colitis : continue Imuran  History of depression: we will resume Zyprexa and Depakote.  DVT prophylaxis: Heparin Code Status: DNR Family Communication: No family at bed side. Disposition Plan:   Status is: Inpatient  Remains inpatient appropriate because:Inpatient level of care appropriate due to severity of illness  Dispo: The patient is from: Home              Anticipated d/c is to: Home              Patient currently is not medically stable to d/c.   Difficult to place patient No  Consultants:  None  Procedures: MRI /CT Head Antimicrobials:  Anti-infectives (From admission, onward)    Start     Dose/Rate Route Frequency Ordered Stop   03/15/21 1000  remdesivir 100 mg in sodium chloride 0.9 % 100 mL IVPB       See Hyperspace for full Linked Orders Report.   100 mg 200 mL/hr over 30 Minutes Intravenous Daily 03/14/21 1455 03/19/21 0959   03/14/21 1600  remdesivir 200 mg in sodium chloride 0.9% 250 mL IVPB       See Hyperspace for full Linked Orders Report.   200 mg 580 mL/hr over 30 Minutes Intravenous Once 03/14/21 1455 03/14/21 1630        Subjective: Patient was seen and examined at bedside.  Overnight events noted.   Patient appears improved he is alert and oriented x2.  It appears like he is at his baseline.  Objective: Vitals:   03/15/21  1400 03/15/21 1415 03/15/21 1500 03/15/21 1504  BP:    126/83  Pulse:    85  Resp: 13 12 20 20   Temp:    98.3 F (36.8 C)  TempSrc:    Oral  SpO2:    96%  Weight:      Height:        Intake/Output Summary (Last 24 hours) at 03/15/2021 1544 Last data filed at 03/15/2021 1345 Gross per 24 hour  Intake 630 ml  Output 1050 ml  Net -420 ml   Filed Weights   03/15/21 0117  Weight: 102.9 kg    Examination:  General exam: Appears calm and comfortable , not in any acute distress. Respiratory system: Clear to auscultation. Respiratory effort  normal. Cardiovascular system: S1 & S2 heard, RRR. No JVD, murmurs, rubs, gallops or clicks. No pedal edema. Gastrointestinal system: Abdomen is nondistended, soft and nontender. No organomegaly or masses felt. Normal bowel sounds heard. Central nervous system: Alert and oriented x 2 . No focal neurological deficits. Extremities: Symmetric 5 x 5 power. Skin: No rashes, lesions or ulcers Psychiatry: Judgement and insight appear normal. Mood & affect appropriate.     Data Reviewed: I have personally reviewed following labs and imaging studies  CBC: Recent Labs  Lab 03/12/21 1939 03/14/21 1058 03/15/21 0342  WBC 4.7 4.2 3.7*  NEUTROABS 4.0 3.3 2.5  HGB 12.5* 14.0 14.5  HCT 37.5* 40.4 42.1  MCV 107.8* 104.1* 103.7*  PLT 157 138* 387*   Basic Metabolic Panel: Recent Labs  Lab 03/12/21 1939 03/14/21 1058 03/14/21 1520 03/15/21 0342  NA 132* 131* 133* 133*  K 4.3 SPECIMEN HEMOLYZED. HEMOLYSIS MAY AFFECT INTEGRITY OF RESULTS. 4.0 3.7  CL 96* 95* 92* 94*  CO2 27 28 30 28   GLUCOSE 100* 94 92 85  BUN 5* 8 9 10   CREATININE 0.66 0.68 0.94 0.70  CALCIUM 8.5* 8.4* 8.4* 8.4*   GFR: Estimated Creatinine Clearance: 96.8 mL/min (by C-G formula based on SCr of 0.7 mg/dL). Liver Function Tests: Recent Labs  Lab 03/12/21 1939 03/14/21 1058 03/15/21 0342  AST 24 86* 35  ALT 13 21 19   ALKPHOS 35* 33* 31*  BILITOT 0.8 1.9* 1.0  PROT 5.5* 6.2* 5.9*  ALBUMIN 3.3* 3.1* 3.0*   No results for input(s): LIPASE, AMYLASE in the last 168 hours. Recent Labs  Lab 03/14/21 1104  AMMONIA 32   Coagulation Profile: Recent Labs  Lab 03/12/21 1939  INR 1.2   Cardiac Enzymes: No results for input(s): CKTOTAL, CKMB, CKMBINDEX, TROPONINI in the last 168 hours. BNP (last 3 results) Recent Labs    12/07/20 1223  PROBNP 87.0   HbA1C: No results for input(s): HGBA1C in the last 72 hours. CBG: Recent Labs  Lab 03/14/21 1114 03/14/21 1654 03/15/21 0021 03/15/21 0602 03/15/21 1152   GLUCAP 92 99 82 83 82   Lipid Profile: Recent Labs    03/14/21 1111  TRIG 67   Thyroid Function Tests: No results for input(s): TSH, T4TOTAL, FREET4, T3FREE, THYROIDAB in the last 72 hours. Anemia Panel: Recent Labs    03/14/21 1111  FERRITIN 360*   Sepsis Labs: Recent Labs  Lab 03/12/21 1939 03/12/21 2114 03/14/21 1111 03/14/21 1155  PROCALCITON  --   --  <0.10  --   LATICACIDVEN 2.1* 1.8  --  1.3    Recent Results (from the past 240 hour(s))  Blood Culture (routine x 2)     Status: None (Preliminary result)   Collection Time: 03/12/21  7:35 PM   Specimen: BLOOD  Result Value Ref Range Status   Specimen Description   Final    BLOOD LEFT ANTECUBITAL Performed at [facility] [address] [place], [place] [address]    Special Requests   Final    BOTTLES DRAWN AEROBIC AND ANAEROBIC Blood Culture results may not be optimal due to an excessive volume of blood received in culture bottles Performed at [facility] [address], [place], [place] [address]    Culture   Final    NO GROWTH 2 DAYS Performed at [facility] [address], [place], [place] [address]    Report Status PENDING  Incomplete  Blood Culture (routine x 2)     Status: None (Preliminary result)   Collection Time: 03/12/21  7:38 PM   Specimen: BLOOD  Result Value Ref Range Status   Specimen Description   Final    BLOOD RIGHT ANTECUBITAL Performed at [facility] [address], [place], [place] [address]    Special Requests   Final    BOTTLES DRAWN AEROBIC AND ANAEROBIC Blood Culture adequate volume Performed at [facility] [address] [place], [place] [address]    Culture   Final    NO GROWTH 2 DAYS Performed at [facility] [address] [place],  [address]    Report Status PENDING  Incomplete  Resp Panel by RT-PCR (Flu A&B, Covid) Nasopharyngeal Swab     Status: Abnormal    Collection Time: 03/12/21  7:46 PM   Specimen: Nasopharyngeal Swab; Nasopharyngeal(NP) swabs in vial transport medium  Result Value Ref Range Status   SARS Coronavirus 2 by RT PCR POSITIVE (A) NEGATIVE Final    Comment: RESULT CALLED TO, READ BACK BY AND VERIFIED WITH: REBECCA GOODWIN AT 2053 ON 03/12/21 BY MAJ (NOTE) SARS-CoV-2 target nucleic acids are DETECTED.  The SARS-CoV-2 RNA is generally detectable in upper respiratory specimens during the acute phase of infection. Positive results are indicative of the presence of the identified virus, but do not rule out bacterial infection or co-infection with other pathogens not detected by the test. Clinical correlation with patient history and other diagnostic information is necessary to determine patient infection status. The expected result is Negative.  Fact Sheet for Patients: EntrepreneurPulse.com.au  Fact Sheet for Healthcare Providers: IncredibleEmployment.be  This test is not yet approved or cleared by the [place] FDA and  has been authorized for detection and/or diagnosis of SARS-CoV-2 by FDA under an Emergency Use Authorization (EUA).  This EUA will remain in effect (meaning this te st can be used) for the duration of  the COVID-19 declaration under Section 564(b)(1) of the Act, 21 U.S.C. section 360bbb-3(b)(1), unless the authorization is terminated or revoked sooner.     Influenza A by PCR NEGATIVE NEGATIVE Final   Influenza B by PCR NEGATIVE NEGATIVE Final    Comment: (NOTE) The Xpert Xpress SARS-CoV-2/FLU/RSV plus assay is intended as an aid in the diagnosis of influenza from Nasopharyngeal swab specimens and should not be used as a sole basis for treatment. Nasal washings and aspirates are unacceptable for Xpert Xpress SARS-CoV-2/FLU/RSV testing.  Fact Sheet for Patients: EntrepreneurPulse.com.au  Fact Sheet for Healthcare  Providers: IncredibleEmployment.be  This test is not yet approved or cleared by the [place] FDA and has been authorized for detection and/or diagnosis of SARS-CoV-2 by FDA under an Emergency Use Authorization (EUA). This EUA will remain in effect (meaning  this test can be used) for the duration of the COVID-19 declaration under Section 564(b)(1) of the Act, 21 U.S.C. section 360bbb-3(b)(1), unless the authorization is terminated or revoked.  Performed at [facility], [place] [address], [place], [place] [address]   Urine culture     Status: None   Collection Time: 03/12/21  8:09 PM   Specimen: In/Out Cath Urine  Result Value Ref Range Status   Specimen Description   Final    IN/OUT CATH URINE Performed at [facility] [address], [place], [place] [address]    Special Requests   Final    NONE Performed at [facility], [place] [address] [place], [place] [address]    Culture   Final    NO GROWTH Performed at [facility], [place] [address] [place], [place] [address]    Report Status 03/14/2021 FINAL  Final  Blood Culture (routine x 2)     Status: None (Preliminary result)   Collection Time: 03/14/21 11:11 AM   Specimen: BLOOD  Result Value Ref Range Status   Specimen Description   Final    BLOOD LEFT ANTECUBITAL Performed at [facility] [address], [place], [place] [address]    Special Requests   Final    BOTTLES DRAWN AEROBIC AND ANAEROBIC Blood Culture results may not be optimal due to an inadequate volume of blood received in culture bottles Performed at [facility] [address] [place], [place] [address]    Culture   Final    NO GROWTH < 24 HOURS Performed at [facility] [address] [place], [place] [address]    Report Status PENDING  Incomplete         Radiology Studies: CT Head Wo Contrast  Result Date:  03/14/2021 CLINICAL DATA:  Mental status change. EXAM: CT HEAD WITHOUT CONTRAST TECHNIQUE: Contiguous axial images were obtained from the base of the skull through the vertex without intravenous contrast. COMPARISON:  March 10, 2021. FINDINGS: Brain: No evidence of acute large vascular territory infarction, hemorrhage, hydrocephalus, extra-axial collection or mass lesion/mass effect. Similar cerebral atrophy with ex vacuo ventricular dilation. Similar patchy white matter hypoattenuation, nonspecific but most likely related to chronic microvascular ischemic disease. Vascular: No hyperdense vessel identified. Calcific atherosclerosis. Skull: No acute fracture. Sinuses/Orbits: Visualized sinuses are clear. No acute orbital findings. Other: No mastoid effusions. IMPRESSION: 1. No evidence of acute intracranial abnormality. 2. Similar chronic microvascular ischemic disease and atrophy. Electronically Signed   By: Margaretha Sheffield MD   On: 03/14/2021 12:16   MR BRAIN WO CONTRAST  Result Date: 03/14/2021 CLINICAL DATA:  Neuro deficit, acute, stroke suspected. Additional provided: Patient has become unresponsive. EXAM: MRI HEAD WITHOUT CONTRAST TECHNIQUE: Multiplanar, multiecho pulse sequences of the brain and surrounding structures were obtained without intravenous contrast. COMPARISON:  Prior head CT examinations 03/14/2021 and earlier. Brain MRI 02/18/2019. FINDINGS: Brain: The examination is intermittently motion degraded. Most notably, there is mild-to-moderate motion degradation of the axial T2 FLAIR sequence, and moderate motion degradation of the coronal T2 sequence. Moderate generalized cerebral atrophy. Comparatively mild cerebellar atrophy. Mild multifocal T2/FLAIR hyperintensity within the cerebral white matter, nonspecific but compatible with chronic small vessel ischemic disease. There is no acute infarct. No evidence of an intracranial mass. No chronic intracranial blood products. No extra-axial fluid  collection. No midline shift. Vascular: Expected proximal arterial flow voids. Skull and upper cervical spine: No focal marrow lesion. Sinuses/Orbits: Visualized  orbits show no acute finding. Trace bilateral ethmoid and sphenoid sinus mucosal thickening. Other: Trace fluid within the bilateral mastoid air cells. IMPRESSION: Intermittently motion degraded examination, as described. No evidence of acute intracranial abnormality. Mild cerebral white matter chronic small vessel ischemic disease, slightly progressed from the brain MRI of 02/18/2019. Moderate generalized cerebral atrophy with comparatively mild cerebellar atrophy, stable. Trace fluid within the bilateral mastoid air cells. Electronically Signed   By: Kellie Simmering DO   On: 03/14/2021 20:16   DG Chest Portable 1 View  Result Date: 03/14/2021 CLINICAL DATA:  Altered mental status.  COVID-19 positive on Sunday. EXAM: PORTABLE CHEST 1 VIEW COMPARISON:  03/12/2021 FINDINGS: Midline trachea. Moderate cardiomegaly. Superior mediastinal soft tissue thickening is chronic and attributed to AP portable technique and low lung volumes. No pleural effusion or pneumothorax. No congestive failure. There may be mild left base atelectasis or scarring. No well-defined lobar consolidation. IMPRESSION: No acute cardiopulmonary disease. Cardiomegaly without congestive failure. Electronically Signed   By: Abigail Miyamoto M.D.   On: 03/14/2021 11:29    Scheduled Meds:  apixaban  5 mg Oral BID   [START ON 03/16/2021] levothyroxine  50 mcg Oral Q0600   mouth rinse  15 mL Mouth Rinse BID   metoprolol tartrate  12.5 mg Oral BID   Continuous Infusions:  remdesivir 100 mg in NS 100 mL 100 mg (03/15/21 0956)     LOS: 1 day    Time spent: 35 mins    Danella Philson, MD [facility]   If 7PM-7AM, please contact night-coverage

## 2021-03-15 NOTE — Progress Notes (Signed)
EEG complete - results pending 

## 2021-03-15 NOTE — Plan of Care (Signed)
  Problem: Education: Goal: Knowledge of General Education information will improve Description Including pain rating scale, medication(s)/side effects and non-pharmacologic comfort measures Outcome: Progressing   Problem: Health Behavior/Discharge Planning: Goal: Ability to manage health-related needs will improve Outcome: Progressing   

## 2021-03-15 NOTE — Procedures (Signed)
Patient Name: [name] MOCH Sr.  MRN: [id number]  Epilepsy Attending: Lora Havens  Referring Physician/Provider: Dr Gean Birchwood Date: 03/15/2021 Duration: 22.50 mins  Patient history: 70yo M with ams. EEG to evaluate for seizure  Level of alertness: Awake  AEDs during EEG study: None  Technical aspects: This EEG study was done with scalp electrodes positioned according to the 10-20 International system of electrode placement. Electrical activity was acquired at a sampling rate of 500Hz  and reviewed with a high frequency filter of 70Hz  and a low frequency filter of 1Hz . EEG data were recorded continuously and digitally stored.   Description: The posterior dominant rhythm consists of 7.5 Hz activity of moderate voltage (25-35 uV) seen predominantly in posterior head regions, symmetric and reactive to eye opening and eye closing. EEG showed continuous generalized 5 to 6 Hz theta as well as intermittent 2-3Hz  delta slowing. Hyperventilation and photic stimulation were not performed.     ABNORMALITY - Continuous slow, generalized  IMPRESSION: This study is suggestive of moderate diffuse encephalopathy, nonspecific etiology. No seizures or epileptiform discharges were seen throughout the recording.  [name]

## 2021-03-15 NOTE — Evaluation (Signed)
Clinical/Bedside Swallow Evaluation Patient Details  Name: [name] GANG Sr. MRN: [id number] Date of Birth: Sep 13, 1950  Today's Date: 03/15/2021 Time: SLP Start Time (ACUTE ONLY): 5038 SLP Stop Time (ACUTE ONLY): 1151 SLP Time Calculation (min) (ACUTE ONLY): 26 min  Past Medical History:  Past Medical History:  Diagnosis Date   Acute delirium 07/14/2020   Adenomatous colon polyp 2016   Allergy    AMS (altered mental status) 07/13/2020   Anxiety    Bell palsy    resolved   Bell's palsy 09/14/2020   CHF (congestive heart failure) (HCC)    Chicken pox    Concussion with no loss of consciousness 07/07/2018   Depression    Diet-controlled diabetes mellitus ([place])    Eating disorder    Essential hypertension 11/29/2017   Gastro-esophageal reflux disease without esophagitis 09/14/2020   GERD (gastroesophageal reflux disease)    History of vitamin D deficiency    Hyperlipidemia    Hypertension    Hypokalemia    Memory loss    MI (myocardial infarction) ([place])    per pt    Neoplasm of uncertain behavior of skin 09/14/2020   Personal history of colonic polyps 09/14/2020   Ulcerative colitis ([place]) 2005-2006   severe    Past Surgical History:  Past Surgical History:  Procedure Laterality Date   COLONOSCOPY WITH PROPOFOL N/A 07/15/2014   Procedure: COLONOSCOPY WITH PROPOFOL;  Surgeon: [name], MD;  Location: [facility];  Service: Endoscopy;  Laterality: N/A;   COLONOSCOPY WITH PROPOFOL N/A 08/22/2015   Procedure: COLONOSCOPY WITH PROPOFOL;  Surgeon: [name], MD;  Location: [facility];  Service: Endoscopy;  Laterality: N/A;   ORIF ANKLE FRACTURE  02/27/2012   Procedure: OPEN REDUCTION INTERNAL FIXATION (ORIF) ANKLE FRACTURE;  Surgeon: Colin Rhein, MD;  Location: [facility];  Service: Orthopedics;  Laterality: Left;  ORIF left bimalleolar ankle fracture   SHOULDER SURGERY     LEFT   SIGMOIDOSCOPY  10/08/2018   Externa and Internal Hmorrhoids. Tubular and  Tublovillous adenoma removed from transverse colon. Inflammatory pseudopolyps, negative for dysplasia   TONSILLECTOMY     UPPER GASTROINTESTINAL ENDOSCOPY     HPI:  70 yo male adm to [facility] with mental status changes. Pt diagnosed with COVID previously and is on precautions currently.  Pt imaging negative for pna, he has a h/o Alzheimer's dementia - brain imaging showed moderate cerebral and mild cerebellar atrophy.  Swallow evaluation ordered.  Per RN, pt was recently here and has now returned.   Assessment / Plan / Recommendation Clinical Impression  Patient presents clinical indications of oral dysphagia - likely due to his dementia.  He is alert and follows directions with overall 80% accuracy.  He did not desire to brush his teeth despite cues/SLP offering toothbrush.  He demonstrates delay in oral transiting with mild superior lingual retention of cracker without awareness. Pt cued consumption of applesauce facilitated clearance.  Pt did not pass the [facility] 3 ounce swallow screen due to requiring rest breaks.  No indication of aspiration during po intake - however pt did demonstrate mild cough at completion of intake and was starting to nod off at end of SLP session. Pt reports he does not use his dentures to eat.  Recommend dys2/thin with strict precautions including have pt self feed. Pt denies dysphagia PTA, but later did admit to occasional coughing with liquids PTA.  Will follow for tolerance, readiness for advancement. SLP Visit Diagnosis: Dysphagia, oral phase (R13.11);Dysphagia, unspecified (  R13.10)    Aspiration Risk  Mild aspiration risk    Diet Recommendation Dysphagia 2 (Fine chop);Thin liquid   Liquid Administration via: Cup;Straw Medication Administration: Whole meds with puree Supervision: Patient able to self feed Compensations: Slow rate;Small sips/bites;Minimize environmental distractions Postural Changes: Seated upright at 90 degrees;Remain upright for at least 30 minutes  after po intake    Other  Recommendations Oral Care Recommendations: Oral care BID   Follow up Recommendations Other (comment) (TBD)      Frequency and Duration min 1 x/week  1 week       Prognosis Prognosis for Safe Diet Advancement: Fair      Swallow Study   General Date of Onset: 03/15/21 HPI: 70 yo male adm to [facility] with mental status changes. Pt diagnosed with COVID previously and is on precautions currently.  Pt imaging negative for pna, he has a h/o Alzheimer's dementia - brain imaging showed moderate cerebral and mild cerebellar atrophy.  Swallow evaluation ordered.  Per RN, pt was recently here and has now returned. Type of Study: Bedside Swallow Evaluation Diet Prior to this Study: NPO Temperature Spikes Noted: Yes Respiratory Status: Room air History of Recent Intubation: No Behavior/Cognition: Alert Oral Cavity Assessment: Within Functional Limits Oral Care Completed by SLP: No (pt declined - after SLP offered and provided toothbrush with paste) Oral Cavity - Dentition: Other (Comment) (per notes, pt has dentures - slp did not locate in his room. no family present and pt advises that he does not use them to eat.) Vision: Functional for self-feeding Self-Feeding Abilities: Needs assist Patient Positioning: Upright in bed Baseline Vocal Quality: Normal Volitional Cough: Congested Volitional Swallow: Unable to elicit    Oral/Motor/Sensory Function Overall Oral Motor/Sensory Function: Within functional limits   Ice Chips Ice chips: Impaired Presentation: Spoon Oral Phase Functional Implications:  (suspect oral delay) Pharyngeal Phase Impairments: Suspected delayed Swallow   Thin Liquid   Thin Liquid:  WFL   Nectar Thick Nectar Thick Liquid: Not tested   Honey Thick Honey Thick Liquid: Not tested   Puree Puree: Impaired Presentation: Self Fed;Spoon Oral Phase Functional Implications: Prolonged oral transit (clinical appearance of delay in oral  transiting) Pharyngeal Phase Impairments: Suspected delayed Swallow   Solid     Solid: Impaired Oral Phase Impairments: Impaired mastication Oral Phase Functional Implications: Other (comment);Oral residue (minimal lingual residuals - used puree to facilitate clearance)      Macario Golds 03/15/2021,12:55 PM  Kathleen Lime, MS [place] Office [phone] Pager [phone]

## 2021-03-16 DIAGNOSIS — G9340 Encephalopathy, unspecified: Secondary | ICD-10-CM | POA: Diagnosis not present

## 2021-03-16 LAB — CBC WITH DIFFERENTIAL/PLATELET
Abs Immature Granulocytes: 0.03 10*3/uL (ref 0.00–0.07)
Basophils Absolute: 0 10*3/uL (ref 0.0–0.1)
Basophils Relative: 0 %
Eosinophils Absolute: 0 10*3/uL (ref 0.0–0.5)
Eosinophils Relative: 0 %
HCT: 42.6 % (ref 39.0–52.0)
Hemoglobin: 14.6 g/dL (ref 13.0–17.0)
Immature Granulocytes: 1 %
Lymphocytes Relative: 24 %
Lymphs Abs: 0.7 10*3/uL (ref 0.7–4.0)
MCH: 35 pg — ABNORMAL HIGH (ref 26.0–34.0)
MCHC: 34.3 g/dL (ref 30.0–36.0)
MCV: 102.2 fL — ABNORMAL HIGH (ref 80.0–100.0)
Monocytes Absolute: 0.6 10*3/uL (ref 0.1–1.0)
Monocytes Relative: 20 %
Neutro Abs: 1.6 10*3/uL — ABNORMAL LOW (ref 1.7–7.7)
Neutrophils Relative %: 55 %
Platelets: 121 10*3/uL — ABNORMAL LOW (ref 150–400)
RBC: 4.17 MIL/uL — ABNORMAL LOW (ref 4.22–5.81)
RDW: 15.6 % — ABNORMAL HIGH (ref 11.5–15.5)
WBC: 3 10*3/uL — ABNORMAL LOW (ref 4.0–10.5)
nRBC: 0 % (ref 0.0–0.2)

## 2021-03-16 LAB — HEMOGLOBIN A1C
Hgb A1c MFr Bld: 4.7 % — ABNORMAL LOW (ref 4.8–5.6)
Mean Plasma Glucose: 88.19 mg/dL

## 2021-03-16 LAB — COMPREHENSIVE METABOLIC PANEL
ALT: 17 U/L (ref 0–44)
AST: 29 U/L (ref 15–41)
Albumin: 3 g/dL — ABNORMAL LOW (ref 3.5–5.0)
Alkaline Phosphatase: 32 U/L — ABNORMAL LOW (ref 38–126)
Anion gap: 9 (ref 5–15)
BUN: 13 mg/dL (ref 8–23)
CO2: 29 mmol/L (ref 22–32)
Calcium: 8.5 mg/dL — ABNORMAL LOW (ref 8.9–10.3)
Chloride: 98 mmol/L (ref 98–111)
Creatinine, Ser: 0.56 mg/dL — ABNORMAL LOW (ref 0.61–1.24)
GFR, Estimated: >60 mL/min (ref >60)
Glucose, Bld: 87 mg/dL (ref 70–99)
Potassium: 3.5 mmol/L (ref 3.5–5.1)
Sodium: 136 mmol/L (ref 135–145)
Total Bilirubin: 0.9 mg/dL (ref 0.3–1.2)
Total Protein: 6 g/dL — ABNORMAL LOW (ref 6.5–8.1)

## 2021-03-16 LAB — GLUCOSE, CAPILLARY
Glucose-Capillary: 95 mg/dL (ref 70–99)
Glucose-Capillary: 132 mg/dL — ABNORMAL HIGH (ref 70–99)
Glucose-Capillary: 163 mg/dL — ABNORMAL HIGH (ref 70–99)
Glucose-Capillary: 95 mg/dL (ref 70–99)

## 2021-03-16 LAB — C-REACTIVE PROTEIN: CRP: 6.6 mg/dL — ABNORMAL HIGH (ref <1.0)

## 2021-03-16 LAB — D-DIMER, QUANTITATIVE: D-Dimer, Quant: 0.36 ug/mL-FEU (ref 0.00–0.50)

## 2021-03-16 MED ORDER — TAMSULOSIN HCL 0.4 MG PO CAPS
0.4000 mg | ORAL_CAPSULE | Freq: Every day | ORAL | Status: DC
  Administered 2021-03-16 – 2021-03-23 (×8): 0.4 mg via ORAL
  Filled 2021-03-16 (×8): qty 1

## 2021-03-16 MED ORDER — LEVOTHYROXINE SODIUM 50 MCG PO TABS: 50.0000 ug | ORAL_TABLET | Freq: Every day | ORAL | Status: DC

## 2021-03-16 MED ORDER — ATORVASTATIN CALCIUM 20 MG PO TABS
20.0000 mg | ORAL_TABLET | Freq: Every day | ORAL | Status: DC
  Administered 2021-03-16 – 2021-03-23 (×8): 20 mg via ORAL
  Filled 2021-03-16 (×8): qty 1

## 2021-03-16 MED ORDER — OLANZAPINE 5 MG PO TABS: 5.0000 mg | ORAL_TABLET | Freq: Every day | ORAL | Status: DC

## 2021-03-16 MED ORDER — INSULIN ASPART 100 UNIT/ML IJ SOLN: 0.0000 [IU] | Freq: Every day | INTRAMUSCULAR | Status: DC

## 2021-03-16 MED ORDER — INSULIN ASPART 100 UNIT/ML IJ SOLN
0.0000 [IU] | Freq: Three times a day (TID) | INTRAMUSCULAR | Status: DC
  Administered 2021-03-16: 3 [IU] via SUBCUTANEOUS
  Administered 2021-03-16 – 2021-03-21 (×4): 2 [IU] via SUBCUTANEOUS

## 2021-03-16 MED ORDER — FUROSEMIDE 40 MG PO TABS
40.0000 mg | ORAL_TABLET | ORAL | Status: DC
  Administered 2021-03-16 – 2021-03-23 (×3): 40 mg via ORAL
  Filled 2021-03-16 (×4): qty 1

## 2021-03-16 MED ORDER — AZATHIOPRINE 50 MG PO TABS
200.0000 mg | ORAL_TABLET | Freq: Every day | ORAL | Status: DC
  Administered 2021-03-16 – 2021-03-23 (×8): 200 mg via ORAL
  Filled 2021-03-16 (×8): qty 4

## 2021-03-16 MED ORDER — OLANZAPINE 5 MG PO TABS
15.0000 mg | ORAL_TABLET | Freq: Every day | ORAL | Status: DC
  Administered 2021-03-16 – 2021-03-18 (×3): 15 mg via ORAL
  Filled 2021-03-16 (×3): qty 3

## 2021-03-16 MED ORDER — DIVALPROEX SODIUM 125 MG PO CSDR
750.0000 mg | DELAYED_RELEASE_CAPSULE | Freq: Two times a day (BID) | ORAL | Status: DC
  Administered 2021-03-16 – 2021-03-23 (×15): 750 mg via ORAL
  Filled 2021-03-16 (×15): qty 6

## 2021-03-16 MED ORDER — INSULIN ASPART 100 UNIT/ML IJ SOLN: 0.0000 [IU] | Freq: Three times a day (TID) | INTRAMUSCULAR | Status: DC

## 2021-03-16 MED ORDER — MELATONIN 5 MG PO TABS
10.0000 mg | ORAL_TABLET | Freq: Every day | ORAL | Status: DC
  Administered 2021-03-16 – 2021-03-22 (×7): 10 mg via ORAL
  Filled 2021-03-16 (×7): qty 2

## 2021-03-16 MED ORDER — DULOXETINE HCL 60 MG PO CPEP
60.0000 mg | ORAL_CAPSULE | Freq: Every day | ORAL | Status: DC
  Administered 2021-03-16 – 2021-03-23 (×8): 60 mg via ORAL
  Filled 2021-03-16 (×8): qty 1

## 2021-03-16 MED ORDER — OLANZAPINE 5 MG PO TABS
5.0000 mg | ORAL_TABLET | Freq: Every day | ORAL | Status: DC
  Administered 2021-03-17: 5 mg via ORAL
  Filled 2021-03-16 (×2): qty 1

## 2021-03-16 MED ORDER — APIXABAN 5 MG PO TABS: 5.0000 mg | ORAL_TABLET | Freq: Two times a day (BID) | ORAL | Status: DC

## 2021-03-16 NOTE — Progress Notes (Signed)
SLP Cancellation Note  Patient Details Name: [name] AUGELLO Sr. MRN: [id number] DOB: 02/03/1951   Cancelled treatment:       Reason Eval/Treat Not Completed: Other (comment) (pt currently with RN, will continue efforts)  Kathleen Lime, MS [place] Office [phone] Pager [phone]  Macario Golds 03/16/2021, 5:25 PM

## 2021-03-16 NOTE — Progress Notes (Signed)
PROGRESS NOTE    [name] DEVINO Sr.  SEG:[id number] DOB: [birth year] DOA: 03/14/2021 PCP: [name], DO   Brief Narrative:  This 70 years old male with PMH significant for dementia, atrial fibrillation, ulcerative colitis, depression brought in the ED after he was found increasingly confused at home.  Patient was not communicating and not himself for the last 3 days. Patient has come to the ER 3 days ago with confusion and weakness and at that time he was diagnosed with COVID and he was discharged home on oral antiviral medication. He has not been communicating and not following commands.  Due to worsening weakness and encephalopathy patient was brought back in the ED.  CT head is unremarkable.  MRI head is unremarkable,  no acute intracranial abnormality.  ABG does not show hypercarbia.  Patient is admitted for possible encephalopathy due to [place].  Assessment & Plan:   Principal Problem:   Acute encephalopathy Active Problems:   Dementia (HCC)   Hypothyroidism   Chronic ulcerative pancolitis (HCC)   Chronic diastolic CHF (congestive heart failure) (HCC)   Paroxysmal atrial fibrillation (HCC)   Weakness  Acute encephalopathy in the setting of COVID infection: Patient was brought in the ED 3 days ago,  found to have COVID and was discharged on antiviral medication. Its most likely related to COVID, patient symptoms has not improved in last 3 days. MRI: No acute intracranial abnormality.  ABGs no hypercarbia. EEG no evidence of seizures. UA unremarkable, chest x-ray unremarkable.  Follow blood cultures. Patient passed bedside swallow eval. Patient seems improved in his mental status.  COVID infection: Patient is not hypoxic at this time,  Closely monitor for worsening respiratory symptoms.   We will continue IV remdesivir.  Hypothyroidism :  Continue levothyroxine.  History of A. Fib : Continue Eliquis. Continue metoprolol 12.5 mg twice daily.  History of ulcerative  colitis : continue Imuran  History of depression: resume Zyprexa and Depakote.  DVT prophylaxis: Heparin Code Status: DNR Family Communication: No family at bed side. Disposition Plan:   Status is: Inpatient  Remains inpatient appropriate because:Inpatient level of care appropriate due to severity of illness  Dispo: The patient is from: Home              Anticipated d/c is to: Home              Patient currently is not medically stable to d/c.   Difficult to place patient No  Consultants:  None  Procedures: MRI /CT Head Antimicrobials:  Anti-infectives (From admission, onward)    Start     Dose/Rate Route Frequency Ordered Stop   03/15/21 1000  remdesivir 100 mg in sodium chloride 0.9 % 100 mL IVPB       See Hyperspace for full Linked Orders Report.   100 mg 200 mL/hr over 30 Minutes Intravenous Daily 03/14/21 1455 03/19/21 0959   03/14/21 1600  remdesivir 200 mg in sodium chloride 0.9% 250 mL IVPB       See Hyperspace for full Linked Orders Report.   200 mg 580 mL/hr over 30 Minutes Intravenous Once 03/14/21 1455 03/14/21 1630        Subjective: Patient was seen and examined at bedside.  Overnight events noted.   Patient appears improved,  he is alert and oriented x 2.  He is slightly confused,  RN reports He slept throughout night without any concerns.  Objective: Vitals:   03/15/21 1504 03/15/21 2112 03/16/21 0500 03/16/21 0932  BP:  126/83 (!) 141/95 127/79 133/82  Pulse: 85 80 78 82  Resp: 20 20 18    Temp: 98.3 F (36.8 C) 98.6 F (37 C) 98 F (36.7 C)   TempSrc: Oral Oral Oral   SpO2: 96% 99% 98%   Weight:      Height:        Intake/Output Summary (Last 24 hours) at 03/16/2021 1415 Last data filed at 03/16/2021 1317 Gross per 24 hour  Intake 720 ml  Output 1850 ml  Net -1130 ml   Filed Weights   03/15/21 0117  Weight: 102.9 kg    Examination:  General exam: Appears calm and comfortable , not in any acute distress. Respiratory system: Clear  to auscultation. Respiratory effort normal. Cardiovascular system: S1 & S2 heard, RRR. No JVD, murmurs, rubs, gallops or clicks. No pedal edema. Gastrointestinal system: Abdomen is nondistended, soft and nontender. No organomegaly or masses felt. Normal bowel sounds heard. Central nervous system: Alert and oriented x 2 . No focal neurological deficits. Extremities: Symmetric 5 x 5 power.  No edema, no cyanosis, no clubbing. Skin: No rashes, lesions or ulcers Psychiatry: Judgement and insight appear normal. Mood & affect appropriate.     Data Reviewed: I have personally reviewed following labs and imaging studies  CBC: Recent Labs  Lab 03/12/21 1939 03/14/21 1058 03/15/21 0342 03/16/21 0357  WBC 4.7 4.2 3.7* 3.0*  NEUTROABS 4.0 3.3 2.5 1.6*  HGB 12.5* 14.0 14.5 14.6  HCT 37.5* 40.4 42.1 42.6  MCV 107.8* 104.1* 103.7* 102.2*  PLT 157 138* 127* 588*   Basic Metabolic Panel: Recent Labs  Lab 03/12/21 1939 03/14/21 1058 03/14/21 1520 03/15/21 0342 03/16/21 0357  NA 132* 131* 133* 133* 136  K 4.3 SPECIMEN HEMOLYZED. HEMOLYSIS MAY AFFECT INTEGRITY OF RESULTS. 4.0 3.7 3.5  CL 96* 95* 92* 94* 98  CO2 27 28 30 28 29   GLUCOSE 100* 94 92 85 87  BUN 5* 8 9 10 13   CREATININE 0.66 0.68 0.94 0.70 0.56*  CALCIUM 8.5* 8.4* 8.4* 8.4* 8.5*   GFR: Estimated Creatinine Clearance: 96.8 mL/min (A) (by C-G formula based on SCr of 0.56 mg/dL (L)). Liver Function Tests: Recent Labs  Lab 03/12/21 1939 03/14/21 1058 03/15/21 0342 03/16/21 0357  AST 24 86* 35 29  ALT 13 21 19 17   ALKPHOS 35* 33* 31* 32*  BILITOT 0.8 1.9* 1.0 0.9  PROT 5.5* 6.2* 5.9* 6.0*  ALBUMIN 3.3* 3.1* 3.0* 3.0*   No results for input(s): LIPASE, AMYLASE in the last 168 hours. Recent Labs  Lab 03/14/21 1104  AMMONIA 32   Coagulation Profile: Recent Labs  Lab 03/12/21 1939  INR 1.2   Cardiac Enzymes: No results for input(s): CKTOTAL, CKMB, CKMBINDEX, TROPONINI in the last 168 hours. BNP (last 3  results) Recent Labs    12/07/20 1223  PROBNP 87.0   HbA1C: No results for input(s): HGBA1C in the last 72 hours. CBG: Recent Labs  Lab 03/15/21 0602 03/15/21 1152 03/15/21 2357 03/16/21 0614 03/16/21 1144  GLUCAP 83 82 109* 95 132*   Lipid Profile: Recent Labs    03/14/21 1111  TRIG 67   Thyroid Function Tests: No results for input(s): TSH, T4TOTAL, FREET4, T3FREE, THYROIDAB in the last 72 hours. Anemia Panel: Recent Labs    03/14/21 1111  FERRITIN 360*   Sepsis Labs: Recent Labs  Lab 03/12/21 1939 03/12/21 2114 03/14/21 1111 03/14/21 1155  PROCALCITON  --   --  <0.10  --   LATICACIDVEN 2.1* 1.8  --  1.3    Recent Results (from the past 240 hour(s))  Blood Culture (routine x 2)     Status: None (Preliminary result)   Collection Time: 03/12/21  7:35 PM   Specimen: BLOOD  Result Value Ref Range Status   Specimen Description   Final    BLOOD LEFT ANTECUBITAL Performed at [facility] [address] [place], [place] [address]    Special Requests   Final    BOTTLES DRAWN AEROBIC AND ANAEROBIC Blood Culture results may not be optimal due to an excessive volume of blood received in culture bottles Performed at [facility] [address] [place], [place] [address]    Culture   Final    NO GROWTH 3 DAYS Performed at [facility], [place] [address] [place], [place] [address]    Report Status PENDING  Incomplete  Blood Culture (routine x 2)     Status: None (Preliminary result)   Collection Time: 03/12/21  7:38 PM   Specimen: BLOOD  Result Value Ref Range Status   Specimen Description   Final    BLOOD RIGHT ANTECUBITAL Performed at [facility] [address], [place], [place] [address]    Special Requests   Final    BOTTLES DRAWN AEROBIC AND ANAEROBIC Blood Culture adequate volume Performed at [facility] [address], [place], [place] [address]    Culture   Final     NO GROWTH 3 DAYS Performed at [facility], [place] [address] [place], [place] [address]    Report Status PENDING  Incomplete  Resp Panel by RT-PCR (Flu A&B, Covid) Nasopharyngeal Swab     Status: Abnormal   Collection Time: 03/12/21  7:46 PM   Specimen: Nasopharyngeal Swab; Nasopharyngeal(NP) swabs in vial transport medium  Result Value Ref Range Status   SARS Coronavirus 2 by RT PCR POSITIVE (A) NEGATIVE Final    Comment: RESULT CALLED TO, READ BACK BY AND VERIFIED WITH: REBECCA GOODWIN AT 2053 ON 03/12/21 BY MAJ (NOTE) SARS-CoV-2 target nucleic acids are DETECTED.  The SARS-CoV-2 RNA is generally detectable in upper respiratory specimens during the acute phase of infection. Positive results are indicative of the presence of the identified virus, but do not rule out bacterial infection or co-infection with other pathogens not detected by the test. Clinical correlation with patient history and other diagnostic information is necessary to determine patient infection status. The expected result is Negative.  Fact Sheet for Patients: EntrepreneurPulse.com.au  Fact Sheet for Healthcare Providers: IncredibleEmployment.be  This test is not yet approved or cleared by the [place] FDA and  has been authorized for detection and/or diagnosis of SARS-CoV-2 by FDA under an Emergency Use Authorization (EUA).  This EUA will remain in effect (meaning this te st can be used) for the duration of  the COVID-19 declaration under Section 564(b)(1) of the Act, 21 U.S.C. section 360bbb-3(b)(1), unless the authorization is terminated or revoked sooner.     Influenza A by PCR NEGATIVE NEGATIVE Final   Influenza B by PCR NEGATIVE NEGATIVE Final    Comment: (NOTE) The Xpert Xpress SARS-CoV-2/FLU/RSV plus assay is intended as an aid in the diagnosis of influenza from Nasopharyngeal swab specimens and should not be used as a sole basis for treatment.  Nasal washings and aspirates are unacceptable for Xpert Xpress SARS-CoV-2/FLU/RSV testing.  Fact Sheet for Patients: EntrepreneurPulse.com.au  Fact Sheet for Healthcare Providers: IncredibleEmployment.be  This test is not yet approved or  cleared by the [place] and has been authorized for detection and/or diagnosis of SARS-CoV-2 by FDA under an Emergency Use Authorization (EUA). This EUA will remain in effect (meaning this test can be used) for the duration of the COVID-19 declaration under Section 564(b)(1) of the Act, 21 U.S.C. section 360bbb-3(b)(1), unless the authorization is terminated or revoked.  Performed at [facility], [place] [address] [place], [place] [address]   Urine culture     Status: None   Collection Time: 03/12/21  8:09 PM   Specimen: In/Out Cath Urine  Result Value Ref Range Status   Specimen Description   Final    IN/OUT CATH URINE Performed at [facility] [address] [place], [place] [address]    Special Requests   Final    NONE Performed at [facility], [place] [address] [place], [place] [address]    Culture   Final    NO GROWTH Performed at [facility], [place] [address], [place], [place] [address]    Report Status 03/14/2021 FINAL  Final  Blood Culture (routine x 2)     Status: None (Preliminary result)   Collection Time: 03/14/21 11:11 AM   Specimen: BLOOD  Result Value Ref Range Status   Specimen Description   Final    BLOOD LEFT ANTECUBITAL Performed at [facility] [address] [place], [place] [address]    Special Requests   Final    BOTTLES DRAWN AEROBIC AND ANAEROBIC Blood Culture results may not be optimal due to an inadequate volume of blood received in culture bottles Performed at [facility] [address] [place], [place] [address]    Culture   Final    NO GROWTH 2  DAYS Performed at [facility] [address], [place], [place] [address]    Report Status PENDING  Incomplete         Radiology Studies: MR BRAIN WO CONTRAST  Result Date: 03/14/2021 CLINICAL DATA:  Neuro deficit, acute, stroke suspected. Additional provided: Patient has become unresponsive. EXAM: MRI HEAD WITHOUT CONTRAST TECHNIQUE: Multiplanar, multiecho pulse sequences of the brain and surrounding structures were obtained without intravenous contrast. COMPARISON:  Prior head CT examinations 03/14/2021 and earlier. Brain MRI 02/18/2019. FINDINGS: Brain: The examination is intermittently motion degraded. Most notably, there is mild-to-moderate motion degradation of the axial T2 FLAIR sequence, and moderate motion degradation of the coronal T2 sequence. Moderate generalized cerebral atrophy. Comparatively mild cerebellar atrophy. Mild multifocal T2/FLAIR hyperintensity within the cerebral white matter, nonspecific but compatible with chronic small vessel ischemic disease. There is no acute infarct. No evidence of an intracranial mass. No chronic intracranial blood products. No extra-axial fluid collection. No midline shift. Vascular: Expected proximal arterial flow voids. Skull and upper cervical spine: No focal marrow lesion. Sinuses/Orbits: Visualized orbits show no acute finding. Trace bilateral ethmoid and sphenoid sinus mucosal thickening. Other: Trace fluid within the bilateral mastoid air cells. IMPRESSION: Intermittently motion degraded examination, as described. No evidence of acute intracranial abnormality. Mild cerebral white matter chronic small vessel ischemic disease, slightly progressed from the brain MRI of 02/18/2019. Moderate generalized cerebral atrophy with comparatively mild cerebellar atrophy, stable. Trace fluid within the bilateral mastoid air cells. Electronically Signed   By: Kellie Simmering DO   On: 03/14/2021 20:16   EEG adult  Result Date: 03/15/2021 Lora Havens, MD     03/15/2021  6:37 PM Patient Name: [name] Flesher  Sr. MRN: [id number] Epilepsy Attending: Lora Havens Referring Physician/Provider: Dr Gean Birchwood Date: 03/15/2021 Duration: 22.50 mins Patient history: 70yo M with ams. EEG to evaluate for seizure Level of alertness: Awake AEDs during EEG study: None Technical aspects: This EEG study was done with scalp electrodes positioned according to the 10-20 International system of electrode placement. Electrical activity was acquired at a sampling rate of 500Hz  and reviewed with a high frequency filter of 70Hz  and a low frequency filter of 1Hz . EEG data were recorded continuously and digitally stored. Description: The posterior dominant rhythm consists of 7.5 Hz activity of moderate voltage (25-35 uV) seen predominantly in posterior head regions, symmetric and reactive to eye opening and eye closing. EEG showed continuous generalized 5 to 6 Hz theta as well as intermittent 2-3Hz  delta slowing. Hyperventilation and photic stimulation were not performed.   ABNORMALITY - Continuous slow, generalized IMPRESSION: This study is suggestive of moderate diffuse encephalopathy, nonspecific etiology. No seizures or epileptiform discharges were seen throughout the recording. Priyanka Barbra Sarks    Scheduled Meds:  apixaban  5 mg Oral BID   insulin aspart  0-15 Units Subcutaneous TID WC   insulin aspart  0-5 Units Subcutaneous QHS   levothyroxine  50 mcg Oral Q0600   mouth rinse  15 mL Mouth Rinse BID   metoprolol tartrate  12.5 mg Oral BID   Continuous Infusions:  remdesivir 100 mg in NS 100 mL 100 mg (03/16/21 0952)     LOS: 2 days    Time spent: 25 mins    Lamond Glantz, MD [facility]   If 7PM-7AM, please contact night-coverage

## 2021-03-17 DIAGNOSIS — G9340 Encephalopathy, unspecified: Secondary | ICD-10-CM | POA: Diagnosis not present

## 2021-03-17 LAB — CBC WITH DIFFERENTIAL/PLATELET
Abs Immature Granulocytes: 0.04 10*3/uL (ref 0.00–0.07)
Basophils Absolute: 0 10*3/uL (ref 0.0–0.1)
Basophils Relative: 1 %
Eosinophils Absolute: 0 10*3/uL (ref 0.0–0.5)
Eosinophils Relative: 1 %
HCT: 43.4 % (ref 39.0–52.0)
Hemoglobin: 15 g/dL (ref 13.0–17.0)
Immature Granulocytes: 1 %
Lymphocytes Relative: 17 %
Lymphs Abs: 0.6 10*3/uL — ABNORMAL LOW (ref 0.7–4.0)
MCH: 35.4 pg — ABNORMAL HIGH (ref 26.0–34.0)
MCHC: 34.6 g/dL (ref 30.0–36.0)
MCV: 102.4 fL — ABNORMAL HIGH (ref 80.0–100.0)
Monocytes Absolute: 0.6 10*3/uL (ref 0.1–1.0)
Monocytes Relative: 16 %
Neutro Abs: 2.5 10*3/uL (ref 1.7–7.7)
Neutrophils Relative %: 64 %
Platelets: 136 10*3/uL — ABNORMAL LOW (ref 150–400)
RBC: 4.24 MIL/uL (ref 4.22–5.81)
RDW: 15.6 % — ABNORMAL HIGH (ref 11.5–15.5)
WBC: 3.9 10*3/uL — ABNORMAL LOW (ref 4.0–10.5)
nRBC: 0 % (ref 0.0–0.2)

## 2021-03-17 LAB — GLUCOSE, CAPILLARY
Glucose-Capillary: 102 mg/dL — ABNORMAL HIGH (ref 70–99)
Glucose-Capillary: 98 mg/dL (ref 70–99)
Glucose-Capillary: 121 mg/dL — ABNORMAL HIGH (ref 70–99)
Glucose-Capillary: 99 mg/dL (ref 70–99)
Glucose-Capillary: 93 mg/dL (ref 70–99)

## 2021-03-17 LAB — COMPREHENSIVE METABOLIC PANEL
ALT: 16 U/L (ref 0–44)
AST: 30 U/L (ref 15–41)
Albumin: 3.2 g/dL — ABNORMAL LOW (ref 3.5–5.0)
Alkaline Phosphatase: 33 U/L — ABNORMAL LOW (ref 38–126)
Anion gap: 10 (ref 5–15)
BUN: 16 mg/dL (ref 8–23)
CO2: 29 mmol/L (ref 22–32)
Calcium: 8.6 mg/dL — ABNORMAL LOW (ref 8.9–10.3)
Chloride: 99 mmol/L (ref 98–111)
Creatinine, Ser: 0.84 mg/dL (ref 0.61–1.24)
GFR, Estimated: >60 mL/min (ref >60)
Glucose, Bld: 109 mg/dL — ABNORMAL HIGH (ref 70–99)
Potassium: 3.5 mmol/L (ref 3.5–5.1)
Sodium: 138 mmol/L (ref 135–145)
Total Bilirubin: 0.9 mg/dL (ref 0.3–1.2)
Total Protein: 6 g/dL — ABNORMAL LOW (ref 6.5–8.1)

## 2021-03-17 LAB — PHOSPHORUS: Phosphorus: 3.6 mg/dL (ref 2.5–4.6)

## 2021-03-17 LAB — C-REACTIVE PROTEIN: CRP: 2.5 mg/dL — ABNORMAL HIGH (ref <1.0)

## 2021-03-17 LAB — MAGNESIUM: Magnesium: 2 mg/dL (ref 1.7–2.4)

## 2021-03-17 LAB — D-DIMER, QUANTITATIVE: D-Dimer, Quant: 0.43 ug/mL-FEU (ref 0.00–0.50)

## 2021-03-17 NOTE — NC FL2 (Signed)
[place] LEVEL OF CARE SCREENING TOOL     IDENTIFICATION  Patient Name: [name] PAOLINI Sr. Birthdate: 12/11/1950 Sex: male Admission Date (Current Location): 03/14/2021  [facility] and [place] Number:  Herbalist and Address:  [facility],  [place], [place]      Provider Number: [id number]  Attending Physician Name and Address:  Shawna Clamp, MD  Relative Name and Phone Number:       Current Level of Care: Hospital Recommended Level of Care: [place] Prior Approval Number:    Date Approved/Denied:   PASRR Number: [id number]  Discharge Plan: SNF    Current Diagnoses: Patient Active Problem List   Diagnosis Date Noted   Weakness 03/14/2021   Acute encephalopathy 03/14/2021   Paroxysmal atrial fibrillation ([place]) 01/11/2021   Chronic diastolic CHF (congestive heart failure) ([place]) 73/71/0626   Acute metabolic encephalopathy 94/85/4627   Lower extremity edema 12/09/2020   Benign prostatic hyperplasia 09/15/2020   Chronic ulcerative pancolitis ([place]) 09/14/2020   Hypercholesterolemia 09/14/2020   Hypothyroidism    Vitamin B 12 deficiency    On statin therapy 05/26/2019   Acquired thrombophilia ([place]) 05/26/2019   QT prolongation 02/19/2019   Aortic atherosclerosis ([place]) 02/18/2019   Carotid artery calcification 02/18/2019   Cardiomegaly 02/18/2019   Dementia ([place]) 02/17/2019   Hypokalemia 02/17/2019   GAD (generalized anxiety disorder) 05/14/2018   Insomnia 11/29/2017   Moderate obstructive sleep apnea 11/08/2017   Morbid obesity ([place]) 11/08/2017   Major depression, recurrent, chronic ([place]) 09/18/2017    Orientation RESPIRATION BLADDER Height & Weight     Self, Place  Normal Incontinent Weight: 226 lb 13.7 oz (102.9 kg) Height:  5' 7"  (170.2 cm)  BEHAVIORAL SYMPTOMS/MOOD NEUROLOGICAL BOWEL NUTRITION STATUS      Continent Diet (see dc summary)  AMBULATORY STATUS COMMUNICATION OF NEEDS Skin    Extensive Assist Verbally Normal                       Personal Care Assistance Level of Assistance  Bathing, Feeding, Dressing Bathing Assistance: Limited assistance Feeding assistance: Limited assistance Dressing Assistance: Limited assistance     Functional Limitations Info  Sight, Hearing, Speech Sight Info: Adequate Hearing Info: Adequate Speech Info: Adequate    SPECIAL CARE FACTORS FREQUENCY  PT (By licensed PT), OT (By licensed OT)     PT Frequency: 5x week OT Frequency: 3x week            Contractures Contractures Info: Not present    Additional Factors Info  Code Status, Allergies Code Status Info: DNR Allergies Info: Losartan Potassium-hctz, Nsaids, Zoloft, Benzodiazepines           Current Medications (03/17/2021):  This is the current hospital active medication list Current Facility-Administered Medications  Medication Dose Route Frequency Provider Last Rate Last Admin   acetaminophen (TYLENOL) tablet 650 mg  650 mg Oral Q6H PRN Rise Patience, MD   650 mg at 03/17/21 0850   Or   acetaminophen (TYLENOL) suppository 650 mg  650 mg Rectal Q6H PRN Rise Patience, MD   650 mg at 03/14/21 1657   apixaban (ELIQUIS) tablet 5 mg  5 mg Oral BID Shawna Clamp, MD   5 mg at 03/17/21 0845   atorvastatin (LIPITOR) tablet 20 mg  20 mg Oral Daily Shawna Clamp, MD   20 mg at 03/17/21 0845   azaTHIOprine (IMURAN) tablet 200 mg  200 mg Oral Daily Dwyane Dee,  Pardeep, MD   200 mg at 03/17/21 0845   divalproex (DEPAKOTE SPRINKLE) capsule 750 mg  750 mg Oral Q12H Shawna Clamp, MD   750 mg at 03/17/21 0845   DULoxetine (CYMBALTA) DR capsule 60 mg  60 mg Oral Daily Shawna Clamp, MD   60 mg at 03/17/21 0844   furosemide (LASIX) tablet 40 mg  40 mg Oral Once per day on Sun Thu Kumar, Rubye [place], MD   40 mg at 03/16/21 1704   insulin aspart (novoLOG) injection 0-15 Units  0-15 Units Subcutaneous TID WC Shawna Clamp, MD   2 Units at 03/17/21 1315   insulin  aspart (novoLOG) injection 0-5 Units  0-5 Units Subcutaneous QHS Shawna Clamp, MD       levothyroxine (SYNTHROID) tablet 50 mcg  50 mcg Oral Q0600 Shawna Clamp, MD   50 mcg at 03/17/21 0612   MEDLINE mouth rinse  15 mL Mouth Rinse BID Rise Patience, MD   15 mL at 03/17/21 1000   melatonin tablet 10 mg  10 mg Oral QHS Shawna Clamp, MD   10 mg at 03/16/21 2202   metoprolol tartrate (LOPRESSOR) tablet 12.5 mg  12.5 mg Oral BID Shawna Clamp, MD   12.5 mg at 03/17/21 0850   OLANZapine (ZYPREXA) tablet 5 mg  5 mg Oral Daily Shawna Clamp, MD   5 mg at 03/17/21 0844   And   OLANZapine (ZYPREXA) tablet 15 mg  15 mg Oral QHS Shawna Clamp, MD   15 mg at 03/16/21 2201   remdesivir 100 mg in sodium chloride 0.9 % 100 mL IVPB  100 mg Intravenous Daily Rise Patience, MD 200 mL/hr at 03/17/21 0847 100 mg at 03/17/21 0847   tamsulosin (FLOMAX) capsule 0.4 mg  0.4 mg Oral Daily Shawna Clamp, MD   0.4 mg at 03/17/21 9323     Discharge Medications: Please see discharge summary for a list of discharge medications.  Relevant Imaging Results:  Relevant Lab Results:   Additional Information SSN: [id number]  Shade Flood, LCSW

## 2021-03-17 NOTE — TOC Initial Note (Signed)
Transition of Care (TOC) - Initial/Assessment Note    Patient Details  Name: [name] BELMARES Sr. MRN: [id number] Date of Birth: 1950-10-16  Transition of Care [facility]) CM/SW Contact:    Shade Flood, LCSW Phone Number: 03/17/2021, 2:49 PM  Clinical Narrative:                  Pt admitted from home. He is Covid+ and PT/OT recommending SNF rehab. Spoke with pt's wife to discuss dc planning. Pt's wife agreeable to SNF referrals. Pt will need to be day 11 post Covid diagnosis (pt diagnosed 03/12/21) before he can go to SNF. Pt will also need ins auth. Pt's wife states pt was recently at [facility]. Discussed CMS provider options and will refer to all local Snfs and pt's wife will choose depending on offers.  Will refer out and TOC will follow.   Expected Discharge Plan: Skilled Nursing Facility Barriers to Discharge: Continued Medical Work up   Patient Goals and CMS Choice Patient states their goals for this hospitalization and ongoing recovery are:: get better CMS Medicare.gov Compare Post Acute Care list provided to:: Patient Represenative (must comment) Choice offered to / list presented to : Spouse  Expected Discharge Plan and Services Expected Discharge Plan: [place] In-house Referral: Clinical Social Work   Post Acute Care Choice: [place] Living arrangements for the past 2 months: [place]                                      Prior Living Arrangements/Services Living arrangements for the past 2 months: Single Family Home Lives with:: Spouse Patient language and need for interpreter reviewed:: Yes Do you feel safe going back to the place where you live?: Yes      Need for Family Participation in Patient Care: Yes (Comment) Care giver support system in place?: Yes (comment) Current home services: DME Criminal Activity/Legal Involvement Pertinent to Current Situation/Hospitalization: No - Comment as  needed  Activities of Daily Living Home Assistive Devices/Equipment: Gilford Rile (specify type) ADL Screening (condition at time of admission) Patient's cognitive ability adequate to safely complete daily activities?: No Is the patient deaf or have difficulty hearing?: No Does the patient have difficulty seeing, even when wearing glasses/contacts?: No Does the patient have difficulty concentrating, remembering, or making decisions?: Yes Patient able to express need for assistance with ADLs?: No Does the patient have difficulty dressing or bathing?: Yes Independently performs ADLs?: No Communication: Independent Dressing (OT): Needs assistance Is this a change from baseline?: Change from baseline, expected to last >3 days Grooming: Needs assistance Is this a change from baseline?: Change from baseline, expected to last >3 days Feeding: Needs assistance Is this a change from baseline?: Change from baseline, expected to last >3 days Bathing: Needs assistance Is this a change from baseline?: Change from baseline, expected to last >3 days Toileting: Dependent Is this a change from baseline?: Change from baseline, expected to last >3days In/Out Bed: Dependent Is this a change from baseline?: Change from baseline, expected to last >3 days Walks in Home: Dependent Does the patient have difficulty walking or climbing stairs?: Yes (secondary to weakness) Weakness of Legs: Both Weakness of Arms/Hands: None  Permission Sought/Granted Permission sought to share information with : Facility Art therapist granted to share information with : Yes, Verbal Permission Granted     Permission granted to share info w  AGENCY: snfs        Emotional Assessment       Orientation: : Oriented to Self, Oriented to Place Alcohol / Substance Use: Not Applicable Psych Involvement: No (comment)  Admission diagnosis:  Weakness [R53.1] Altered mental status, unspecified altered mental status  type [R41.82] Acute encephalopathy [G93.40] Patient Active Problem List   Diagnosis Date Noted   Weakness 03/14/2021   Acute encephalopathy 03/14/2021   Paroxysmal atrial fibrillation ([address]) 01/11/2021   Chronic diastolic CHF (congestive heart failure) ([place]) 84/16/6063   Acute metabolic encephalopathy 01/60/1093   Lower extremity edema 12/09/2020   Benign prostatic hyperplasia 09/15/2020   Chronic ulcerative pancolitis ([place]) 09/14/2020   Hypercholesterolemia 09/14/2020   Hypothyroidism    Vitamin B 12 deficiency    On statin therapy 05/26/2019   Acquired thrombophilia ([place]) 05/26/2019   QT prolongation 02/19/2019   Aortic atherosclerosis ([place]) 02/18/2019   Carotid artery calcification 02/18/2019   Cardiomegaly 02/18/2019   Dementia ([place]) 02/17/2019   Hypokalemia 02/17/2019   GAD (generalized anxiety disorder) 05/14/2018   Insomnia 11/29/2017   Moderate obstructive sleep apnea 11/08/2017   Morbid obesity ([place]) 11/08/2017   Major depression, recurrent, chronic ([place]) 09/18/2017   PCP:  [name], DO Pharmacy:   [id number] [place], [place] - [address] [place] [id number]: [phone]: [phone]    Social Determinants of Health (SDOH) Interventions    Readmission Risk Interventions Readmission Risk Prevention Plan 02/18/2019  Transportation Screening Complete  PCP or Specialist Appt within 3-5 Days Complete  [place]  Social Work Consult for [place]anning/Counseling Complete  Palliative Care Screening Not Applicable  Medication Review (Press photographer Complete  Some recent data might be hidden

## 2021-03-17 NOTE — Evaluation (Signed)
Physical Therapy Evaluation Patient Details Name: [name] WOJCICKI Sr. MRN: [id number] DOB: Mar 02, 1951 Today's Date: 03/17/2021   History of Present Illness  70 years old male brought in the ED after he was found increasingly confused at home and admitted for acute encephalopathy in setting of Covid-19 infection.   PMH significant for dementia, atrial fibrillation, ulcerative colitis, depression  Clinical Impression  Pt admitted with above diagnosis. Pt currently with functional limitations due to the deficits listed below (see PT Problem List). Pt will benefit from skilled PT to increase their independence and safety with mobility to allow discharge to the venue listed below.   Pt not very alert/awake.  Pt does awaken to verbal stimuli however unable to remain awake. Pt not assisting with bed mobility and appeared reluctant to mobilize today.  Pt also with periods of apnea while therapist in room with pt however RN reports pt does have sleep apnea.  Pt requiring increased assist at this time and will likely need SNF upon d/c.     Follow Up Recommendations SNF    Equipment Recommendations  None recommended by PT    Recommendations for Other Services       Precautions / Restrictions Precautions Precautions: Fall Precaution Comments: currently in mittens      Mobility  Bed Mobility Overal bed mobility: Needs Assistance Bed Mobility: Supine to Sit     Supine to sit: +2 for physical assistance;Total assist     General bed mobility comments: attempted to assist pt however he returned LEs to middle of bed, pt not accepting of assist to mobilize, did not assist with UEs (removed mittens for pt to use hands and he did not lift either UE, mittens reapplied)    Transfers                    Ambulation/Gait                Stairs            Wheelchair Mobility    Modified Rankin (Stroke Patients Only)       Balance                                              Pertinent Vitals/Pain Pain Assessment: No/denies pain    Home Living Family/patient expects to be discharged to:: Skilled nursing facility Living Arrangements: Spouse/significant other               Additional Comments: patient unable to provide details of home situation. Living at home with spouse.  Per RN family all have Covid.    Prior Function Level of Independence: Needs assistance   Gait / Transfers Assistance Needed: likely at least transferring at home however pt poor historian           Hand Dominance        Extremity/Trunk Assessment   Upper Extremity Assessment Upper Extremity Assessment: Generalized weakness    Lower Extremity Assessment Lower Extremity Assessment: Generalized weakness       Communication   Communication: No difficulties  Cognition Arousal/Alertness: Lethargic Behavior During Therapy: Flat affect Overall Cognitive Status: Impaired/Different from baseline                                 General Comments: admitted for acute  encephalopathy, hx of dementia, not able to follow commands today or remain awake      General Comments      Exercises     Assessment/Plan    PT Assessment Patient needs continued PT services  PT Problem List Decreased range of motion;Decreased knowledge of use of DME;Decreased balance;Decreased activity tolerance;Decreased mobility;Decreased strength;Decreased cognition       PT Treatment Interventions Gait training;DME instruction;Therapeutic exercise;Functional mobility training;Therapeutic activities;Patient/family education;Balance training;Wheelchair mobility training    PT Goals (Current goals can be found in the Care Plan section)  Acute Rehab PT Goals PT Goal Formulation: Patient unable to participate in goal setting Time For Goal Achievement: 03/31/21 Potential to Achieve Goals: Fair    Frequency Min 2X/week   Barriers to discharge         Co-evaluation               AM-PAC PT "6 Clicks" Mobility  Outcome Measure Help needed turning from your back to your side while in a flat bed without using bedrails?: Total Help needed moving from lying on your back to sitting on the side of a flat bed without using bedrails?: Total Help needed moving to and from a bed to a chair (including a wheelchair)?: Total Help needed standing up from a chair using your arms (e.g., wheelchair or bedside chair)?: Total Help needed to walk in hospital room?: Total Help needed climbing 3-5 steps with a railing? : Total 6 Click Score: 6    End of Session   Activity Tolerance: Patient limited by lethargy;Patient limited by fatigue Patient left: in bed;with call bell/phone within reach;with bed alarm set Nurse Communication: Mobility status (apnea) PT Visit Diagnosis: Difficulty in walking, not elsewhere classified (R26.2);Muscle weakness (generalized) (M62.81)    Time: 5701-7793 PT Time Calculation (min) (ACUTE ONLY): 18 min   Charges:   PT Evaluation $PT Eval Low Complexity: 1 Low        Kati PT, DPT Acute Rehabilitation Services Pager: [phone] Office: [phone]   York Ram E 03/17/2021, 12:53 PM

## 2021-03-17 NOTE — Progress Notes (Signed)
PROGRESS NOTE    [name] DENARDO Sr.  BWI:[id number] DOB: 11-11-1950 DOA: 03/14/2021 PCP: [name], DO   Brief Narrative:  This 70 years old male with PMH significant for dementia, atrial fibrillation, ulcerative colitis, depression brought in the ED after he was found increasingly confused at home.  Patient was not communicating and not himself for the last 3 days. Patient has come to the ER 3 days ago with confusion and weakness and at that time he was diagnosed with COVID and he was discharged home on oral antiviral medication. He has not been communicating and not following commands.  Due to worsening weakness and encephalopathy patient was brought back in the ED.  CT head is unremarkable.  MRI head is unremarkable, No acute intracranial abnormality.  ABG does not show hypercarbia.  Patient is admitted for possible encephalopathy due to [place].  Assessment & Plan:   Principal Problem:   Acute encephalopathy Active Problems:   Dementia (HCC)   Hypothyroidism   Chronic ulcerative pancolitis (HCC)   Chronic diastolic CHF (congestive heart failure) (HCC)   Paroxysmal atrial fibrillation (HCC)   Weakness  Acute encephalopathy in the setting of COVID infection: Patient was brought in the ED 3 days ago,  found to have COVID and was discharged on antiviral medication. Its most likely related to COVID, patient symptoms has not improved in last 3 days. MRI: No acute intracranial abnormality.  ABGs no hypercarbia. EEG no evidence of seizures. UA unremarkable, chest x-ray unremarkable.  Blood cultures no growth so far. Bedside swallow evaluation recommended dysphagia 2 thin liquids. Patient seems improved in his mental status.  Appears at his baseline.  COVID infection: Patient is not hypoxic at this time,  Closely monitor for worsening respiratory symptoms.   Continue IV remdesivir ( day 3/4)  Hypothyroidism :  Continue levothyroxine.  History of A. Fib : Continue  Eliquis. Continue metoprolol 12.5 mg twice daily. HR controlled.  History of ulcerative colitis : continue Imuran  History of depression: resume Zyprexa and Depakote.  DVT prophylaxis: Heparin Code Status: DNR Family Communication: No family at bed side. Disposition Plan:   Status is: Inpatient  Remains inpatient appropriate because:Inpatient level of care appropriate due to severity of illness  Dispo: The patient is from: Home              Anticipated d/c is to: Home              Patient currently is not medically stable to d/c.   Difficult to place patient No  Consultants:  None  Procedures: MRI /CT Head Antimicrobials:  Anti-infectives (From admission, onward)    Start     Dose/Rate Route Frequency Ordered Stop   03/15/21 1000  remdesivir 100 mg in sodium chloride 0.9 % 100 mL IVPB       See Hyperspace for full Linked Orders Report.   100 mg 200 mL/hr over 30 Minutes Intravenous Daily 03/14/21 1455 03/19/21 0959   03/14/21 1600  remdesivir 200 mg in sodium chloride 0.9% 250 mL IVPB       See Hyperspace for full Linked Orders Report.   200 mg 580 mL/hr over 30 Minutes Intravenous Once 03/14/21 1455 03/14/21 1630        Subjective: Patient was seen and examined at bedside.  Overnight events noted.   Patient appears improved,  he is alert and oriented x 2.  He is slightly confused, but seems his baseline. He denies any chest pain, shortness of breath,  dizziness, palpitations.  Objective: Vitals:   03/16/21 2000 03/17/21 0622 03/17/21 0651 03/17/21 0850  BP: (!) 142/91 105/76  115/78  Pulse: 81 86  81  Resp: 16 17    Temp: 98.7 F (37.1 C)  98.3 F (36.8 C)   TempSrc: Oral  Oral   SpO2:  92%    Weight:      Height:        Intake/Output Summary (Last 24 hours) at 03/17/2021 1129 Last data filed at 03/17/2021 0600 Gross per 24 hour  Intake 360 ml  Output 1350 ml  Net -990 ml   Filed Weights   03/15/21 0117  Weight: 102.9 kg     Examination:  General exam: Appears calm and comfortable , not in any acute distress. Respiratory system: Clear to auscultation. Respiratory effort normal. Cardiovascular system: S1 & S2 heard, RRR. No JVD, murmurs, rubs, gallops or clicks. No pedal edema. Gastrointestinal system: Abdomen is nondistended, soft and nontender. No organomegaly or masses felt. Normal bowel sounds heard. Central nervous system: Alert and oriented x 2 . No focal neurological deficits. Extremities: Symmetric 5 x 5 power.  No edema, no cyanosis, no clubbing. Skin: No rashes, lesions or ulcers Psychiatry: Judgement and insight appear normal. Mood & affect appropriate.     Data Reviewed: I have personally reviewed following labs and imaging studies  CBC: Recent Labs  Lab 03/12/21 1939 03/14/21 1058 03/15/21 0342 03/16/21 0357 03/17/21 0412  WBC 4.7 4.2 3.7* 3.0* 3.9*  NEUTROABS 4.0 3.3 2.5 1.6* 2.5  HGB 12.5* 14.0 14.5 14.6 15.0  HCT 37.5* 40.4 42.1 42.6 43.4  MCV 107.8* 104.1* 103.7* 102.2* 102.4*  PLT 157 138* 127* 121* 149*   Basic Metabolic Panel: Recent Labs  Lab 03/14/21 1058 03/14/21 1520 03/15/21 0342 03/16/21 0357 03/17/21 0412  NA 131* 133* 133* 136 138  K SPECIMEN HEMOLYZED. HEMOLYSIS MAY AFFECT INTEGRITY OF RESULTS. 4.0 3.7 3.5 3.5  CL 95* 92* 94* 98 99  CO2 28 30 28 29 29   GLUCOSE 94 92 85 87 109*  BUN 8 9 10 13 16   CREATININE 0.68 0.94 0.70 0.56* 0.84  CALCIUM 8.4* 8.4* 8.4* 8.5* 8.6*  MG  --   --   --   --  2.0  PHOS  --   --   --   --  3.6   GFR: Estimated Creatinine Clearance: 92.2 mL/min (by C-G formula based on SCr of 0.84 mg/dL). Liver Function Tests: Recent Labs  Lab 03/12/21 1939 03/14/21 1058 03/15/21 0342 03/16/21 0357 03/17/21 0412  AST 24 86* 35 29 30  ALT 13 21 19 17 16   ALKPHOS 35* 33* 31* 32* 33*  BILITOT 0.8 1.9* 1.0 0.9 0.9  PROT 5.5* 6.2* 5.9* 6.0* 6.0*  ALBUMIN 3.3* 3.1* 3.0* 3.0* 3.2*   No results for input(s): LIPASE, AMYLASE in the last  168 hours. Recent Labs  Lab 03/14/21 1104  AMMONIA 32   Coagulation Profile: Recent Labs  Lab 03/12/21 1939  INR 1.2   Cardiac Enzymes: No results for input(s): CKTOTAL, CKMB, CKMBINDEX, TROPONINI in the last 168 hours. BNP (last 3 results) Recent Labs    12/07/20 1223  PROBNP 87.0   HbA1C: Recent Labs    03/16/21 0357  HGBA1C 4.7*   CBG: Recent Labs  Lab 03/16/21 1144 03/16/21 1701 03/16/21 2158 03/17/21 0618 03/17/21 0840  GLUCAP 132* 163* 95 102* 98   Lipid Profile: No results for input(s): CHOL, HDL, LDLCALC, TRIG, CHOLHDL, LDLDIRECT in the last 72  hours.  Thyroid Function Tests: No results for input(s): TSH, T4TOTAL, FREET4, T3FREE, THYROIDAB in the last 72 hours. Anemia Panel: No results for input(s): VITAMINB12, FOLATE, FERRITIN, TIBC, IRON, RETICCTPCT in the last 72 hours.  Sepsis Labs: Recent Labs  Lab 03/12/21 1939 03/12/21 2114 03/14/21 1111 03/14/21 1155  PROCALCITON  --   --  <0.10  --   LATICACIDVEN 2.1* 1.8  --  1.3    Recent Results (from the past 240 hour(s))  Blood Culture (routine x 2)     Status: None (Preliminary result)   Collection Time: 03/12/21  7:35 PM   Specimen: BLOOD  Result Value Ref Range Status   Specimen Description   Final    BLOOD LEFT ANTECUBITAL Performed at [facility], [place] [address] [place], [place] [address]    Special Requests   Final    BOTTLES DRAWN AEROBIC AND ANAEROBIC Blood Culture results may not be optimal due to an excessive volume of blood received in culture bottles Performed at [facility] [address] [place], [place] [address]    Culture   Final    NO GROWTH 4 DAYS Performed at [facility], [place] [address], [place], [place] [address]    Report Status PENDING  Incomplete  Blood Culture (routine x 2)     Status: None (Preliminary result)   Collection Time: 03/12/21  7:38 PM   Specimen: BLOOD  Result Value Ref Range Status   Specimen  Description   Final    BLOOD RIGHT ANTECUBITAL Performed at [place] [address] [place], [place] [address]    Special Requests   Final    BOTTLES DRAWN AEROBIC AND ANAEROBIC Blood Culture adequate volume Performed at [facility] [address], [place], [place] [address]    Culture   Final    NO GROWTH 4 DAYS Performed at [facility], [place] [address] [place], [place] [address]    Report Status PENDING  Incomplete  Resp Panel by RT-PCR (Flu A&B, Covid) Nasopharyngeal Swab     Status: Abnormal   Collection Time: 03/12/21  7:46 PM   Specimen: Nasopharyngeal Swab; Nasopharyngeal(NP) swabs in vial transport medium  Result Value Ref Range Status   SARS Coronavirus 2 by RT PCR POSITIVE (A) NEGATIVE Final    Comment: RESULT CALLED TO, READ BACK BY AND VERIFIED WITH: REBECCA GOODWIN AT 2053 ON 03/12/21 BY MAJ (NOTE) SARS-CoV-2 target nucleic acids are DETECTED.  The SARS-CoV-2 RNA is generally detectable in upper respiratory specimens during the acute phase of infection. Positive results are indicative of the presence of the identified virus, but do not rule out bacterial infection or co-infection with other pathogens not detected by the test. Clinical correlation with patient history and other diagnostic information is necessary to determine patient infection status. The expected result is Negative.  Fact Sheet for Patients: EntrepreneurPulse.com.au  Fact Sheet for Healthcare Providers: IncredibleEmployment.be  This test is not yet approved or cleared by the [place] FDA and  has been authorized for detection and/or diagnosis of SARS-CoV-2 by FDA under an Emergency Use Authorization (EUA).  This EUA will remain in effect (meaning this te st can be used) for the duration of  the COVID-19 declaration under Section 564(b)(1) of the Act, 21 U.S.C. section 360bbb-3(b)(1), unless the  authorization is terminated or revoked sooner.     Influenza A by PCR NEGATIVE NEGATIVE Final   Influenza B by PCR NEGATIVE  NEGATIVE Final    Comment: (NOTE) The Xpert Xpress SARS-CoV-2/FLU/RSV plus assay is intended as an aid in the diagnosis of influenza from Nasopharyngeal swab specimens and should not be used as a sole basis for treatment. Nasal washings and aspirates are unacceptable for Xpert Xpress SARS-CoV-2/FLU/RSV testing.  Fact Sheet for Patients: EntrepreneurPulse.com.au  Fact Sheet for Healthcare Providers: IncredibleEmployment.be  This test is not yet approved or cleared by the [place] FDA and has been authorized for detection and/or diagnosis of SARS-CoV-2 by FDA under an Emergency Use Authorization (EUA). This EUA will remain in effect (meaning this test can be used) for the duration of the COVID-19 declaration under Section 564(b)(1) of the Act, 21 U.S.C. section 360bbb-3(b)(1), unless the authorization is terminated or revoked.  Performed at [facility], [place] [address], [place], [place] [address]   Urine culture     Status: None   Collection Time: 03/12/21  8:09 PM   Specimen: In/Out Cath Urine  Result Value Ref Range Status   Specimen Description   Final    IN/OUT CATH URINE Performed at [facility] [address], [place], [place] [address]    Special Requests   Final    NONE Performed at [facility], [place] [address] [place], [place] [address]    Culture   Final    NO GROWTH Performed at [facility], [place] [address] [place], [place] [address]    Report Status 03/14/2021 FINAL  Final  Blood Culture (routine x 2)     Status: None (Preliminary result)   Collection Time: 03/14/21 11:11 AM   Specimen: BLOOD  Result Value Ref Range Status   Specimen Description   Final    BLOOD LEFT ANTECUBITAL Performed at [facility] [address], [place], [place] [address]    Special Requests   Final    BOTTLES DRAWN AEROBIC AND ANAEROBIC Blood Culture results may not be optimal due to an inadequate volume of blood received in culture bottles Performed at [facility] [address] [place], [place] [address]    Culture   Final    NO GROWTH 3 DAYS Performed at [facility], [place] [address], [place], [place] [address]    Report Status PENDING  Incomplete    Radiology Studies: EEG adult  Result Date: 03-23-2021 Lora Havens, MD     03-23-2021  6:37 PM Patient Name: [name] [facility] Sr. MRN: [id number] Epilepsy Attending: Lora Havens Referring Physician/Provider: Dr Gean Birchwood Date: 2021-03-23 Duration: 22.50 mins Patient history: 70yo M with ams. EEG to evaluate for seizure Level of alertness: Awake AEDs during EEG study: None Technical aspects: This EEG study was done with scalp electrodes positioned according to the 10-20 International system of electrode placement. Electrical activity was acquired at a sampling rate of 500Hz  and reviewed with a high frequency filter of 70Hz  and a low frequency filter of 1Hz . EEG data were recorded continuously and digitally stored. Description: The posterior dominant rhythm consists of 7.5 Hz activity of moderate voltage (25-35 uV) seen predominantly in posterior head regions, symmetric and reactive to eye opening and eye closing. EEG showed continuous generalized 5 to 6 Hz theta as well as intermittent 2-3Hz  delta slowing. Hyperventilation and photic stimulation were not performed.   ABNORMALITY - Continuous slow, generalized IMPRESSION: This study is suggestive of moderate diffuse encephalopathy, nonspecific etiology. No seizures or epileptiform discharges were seen throughout the recording.  Priyanka Barbra Sarks    Scheduled Meds:  apixaban  5 mg Oral BID   atorvastatin  20 mg Oral Daily   azaTHIOprine  200 mg Oral Daily   divalproex  750 mg Oral Q12H    DULoxetine  60 mg Oral Daily   furosemide  40 mg Oral Once per day on Sun Thu   insulin aspart  0-15 Units Subcutaneous TID WC   insulin aspart  0-5 Units Subcutaneous QHS   levothyroxine  50 mcg Oral Q0600   mouth rinse  15 mL Mouth Rinse BID   melatonin  10 mg Oral QHS   metoprolol tartrate  12.5 mg Oral BID   OLANZapine  5 mg Oral Daily   And   OLANZapine  15 mg Oral QHS   tamsulosin  0.4 mg Oral Daily   Continuous Infusions:  remdesivir 100 mg in NS 100 mL 100 mg (03/17/21 0847)     LOS: 3 days    Time spent: 25 mins    Mirabelle Cyphers, MD [facility]   If 7PM-7AM, please contact night-coverage

## 2021-03-17 NOTE — Progress Notes (Signed)
OT Cancellation Note  Patient Details Name: [name] Sr. MRN: [id number] DOB: March 31, 1951   Cancelled Treatment:    Reason Eval/Treat Not Completed: Patient's level of consciousness. Patient not alert to participate. Will f/u as able.  Naveah Brave L Aryella Besecker 03/17/2021, 2:35 PM

## 2021-03-18 DIAGNOSIS — G9340 Encephalopathy, unspecified: Secondary | ICD-10-CM | POA: Diagnosis not present

## 2021-03-18 LAB — COMPREHENSIVE METABOLIC PANEL
ALT: 17 U/L (ref 0–44)
AST: 26 U/L (ref 15–41)
Albumin: 3.2 g/dL — ABNORMAL LOW (ref 3.5–5.0)
Alkaline Phosphatase: 36 U/L — ABNORMAL LOW (ref 38–126)
Anion gap: 6 (ref 5–15)
BUN: 19 mg/dL (ref 8–23)
CO2: 33 mmol/L — ABNORMAL HIGH (ref 22–32)
Calcium: 8.5 mg/dL — ABNORMAL LOW (ref 8.9–10.3)
Chloride: 100 mmol/L (ref 98–111)
Creatinine, Ser: 1.06 mg/dL (ref 0.61–1.24)
GFR, Estimated: >60 mL/min (ref >60)
Glucose, Bld: 97 mg/dL (ref 70–99)
Potassium: 3.5 mmol/L (ref 3.5–5.1)
Sodium: 139 mmol/L (ref 135–145)
Total Bilirubin: 1 mg/dL (ref 0.3–1.2)
Total Protein: 5.8 g/dL — ABNORMAL LOW (ref 6.5–8.1)

## 2021-03-18 LAB — CBC WITH DIFFERENTIAL/PLATELET
Abs Immature Granulocytes: 0.07 10*3/uL (ref 0.00–0.07)
Basophils Absolute: 0 10*3/uL (ref 0.0–0.1)
Basophils Relative: 1 %
Eosinophils Absolute: 0.1 10*3/uL (ref 0.0–0.5)
Eosinophils Relative: 4 %
HCT: 43.3 % (ref 39.0–52.0)
Hemoglobin: 14.9 g/dL (ref 13.0–17.0)
Immature Granulocytes: 2 %
Lymphocytes Relative: 22 %
Lymphs Abs: 0.8 10*3/uL (ref 0.7–4.0)
MCH: 35.4 pg — ABNORMAL HIGH (ref 26.0–34.0)
MCHC: 34.4 g/dL (ref 30.0–36.0)
MCV: 102.9 fL — ABNORMAL HIGH (ref 80.0–100.0)
Monocytes Absolute: 0.6 10*3/uL (ref 0.1–1.0)
Monocytes Relative: 16 %
Neutro Abs: 2.1 10*3/uL (ref 1.7–7.7)
Neutrophils Relative %: 55 %
Platelets: 125 10*3/uL — ABNORMAL LOW (ref 150–400)
RBC: 4.21 MIL/uL — ABNORMAL LOW (ref 4.22–5.81)
RDW: 15.4 % (ref 11.5–15.5)
WBC: 3.7 10*3/uL — ABNORMAL LOW (ref 4.0–10.5)
nRBC: 0 % (ref 0.0–0.2)

## 2021-03-18 LAB — GLUCOSE, CAPILLARY
Glucose-Capillary: 92 mg/dL (ref 70–99)
Glucose-Capillary: 86 mg/dL (ref 70–99)
Glucose-Capillary: 109 mg/dL — ABNORMAL HIGH (ref 70–99)
Glucose-Capillary: 107 mg/dL — ABNORMAL HIGH (ref 70–99)
Glucose-Capillary: 124 mg/dL — ABNORMAL HIGH (ref 70–99)

## 2021-03-18 LAB — CULTURE, BLOOD (ROUTINE X 2)
Culture: NO GROWTH
Culture: NO GROWTH
Special Requests: ADEQUATE

## 2021-03-18 LAB — D-DIMER, QUANTITATIVE: D-Dimer, Quant: 0.39 ug/mL-FEU (ref 0.00–0.50)

## 2021-03-18 LAB — C-REACTIVE PROTEIN: CRP: 1.2 mg/dL — ABNORMAL HIGH (ref <1.0)

## 2021-03-18 NOTE — Evaluation (Signed)
Occupational Therapy Evaluation Patient Details Name: [name] WELLER Sr. MRN: [id number] DOB: 02-14-1951 Today's Date: 03/18/2021    History of Present Illness 70 years old male brought in the ED after he was found increasingly confused at home and admitted for acute encephalopathy in setting of Covid-19 infection.   PMH significant for dementia, atrial fibrillation, ulcerative colitis, depression   Clinical Impression   Patient is baseline dementia but with family reported decreased mentation since becoming sck with COVID.  No family present to give any home or PLOF informations and pt currently able to follow some 1-step commends but is oriented to first name only.  Pt is requiring assistance with ADLs including maximum to total assist with toileting, total assist with LE dressing, moderate to max assist with bathing, minimal assist with UE dressing, and supervision with seated grooming and eating.  During this evaluation, patient was limited by confusion, flat affect, poor activity tolerance, and generalized weakness, which has the potential to impact patient's and caregiver's safety and independence during functional mobility, as well as performance for ADLs. Rison "6-clicks" Daily Activity Inpatient Short Form score of 13/24 this session. Patient lives with his family, who are unable to provide supervision and assistance as they all are sick with COVID.  Patient demonstrates fair rehab potential, and should benefit from continued skilled occupational therapy services while in acute care to maximize safety, independence and quality of life at home.  Continued occupational therapy services in a SNF setting prior to return home is recommended.  ?    Follow Up Recommendations  SNF    Equipment Recommendations       Recommendations for Other Services       Precautions / Restrictions Precautions Precautions: Fall Precaution Comments: currently in mittens;  Airborn/Contact Restrictions Weight Bearing Restrictions: No      Mobility Bed Mobility Overal bed mobility: Needs Assistance Bed Mobility: Supine to Sit     Supine to sit: Mod assist     General bed mobility comments: Pt given step by step instructions with tactile cues as needed to suppliment. Mod As to advance LEs off EOB and pt able to push trunk to midline with Mod As.    Transfers Overall transfer level: Needs assistance Equipment used: Rolling walker (2 wheeled) Transfers: Sit to/from [facility] Sit to Stand: Min assist;From elevated surface Stand pivot transfers: Min assist            Balance Overall balance assessment: Needs assistance   Sitting balance-Leahy Scale: Fair       Standing balance-Leahy Scale: Poor                             ADL either performed or assessed with clinical judgement   ADL Overall ADL's : Needs assistance/impaired Eating/Feeding: Set up;Supervision/ safety;Sitting   Grooming: Wash/dry face;Sitting;Set up;Supervision/safety Grooming Details (indicate cue type and reason): Warm Washcloth placed in pt's hand and pt given command to wash face and pt followed well and thoroughly. Upper Body Bathing: Minimal assistance;Sitting Upper Body Bathing Details (indicate cue type and reason): At least Min As due to confusion. Lower Body Bathing: Maximal assistance;+2 for physical assistance;Sit to/from stand;Cueing for safety;Cueing for sequencing   Upper Body Dressing : Moderate assistance;Sitting;Cueing for sequencing;Cueing for safety   Lower Body Dressing: Total assistance;Sitting/lateral leans   Toilet Transfer: RW;Minimal assistance;Stand-pivot;Ambulation Toilet Transfer Details (indicate cue type and reason): Pt performed stand from EOB with Min As  and used RW to take a few steps to recliner with Min As. Toileting- Clothing Manipulation and Hygiene: Bed level;Total assistance;+2 for physical  assistance Toileting - Clothing Manipulation Details (indicate cue type and reason): Pt using external male catheter. Currently expect total assist due to confusion.     Functional mobility during ADLs: Minimal assistance;Rolling walker;Cueing for safety       Vision   Additional Comments: Unable to test due to impaired cogntion. Pt was intermittently distracted by TV.     Perception     Praxis      Pertinent Vitals/Pain Pain Assessment: Faces Faces Pain Scale: No hurt     Hand Dominance Right   Extremity/Trunk Assessment Upper Extremity Assessment Upper Extremity Assessment: RUE deficits/detail;Overall [facility] for tasks assessed   Lower Extremity Assessment Lower Extremity Assessment: Defer to PT evaluation       Communication Communication Communication: No difficulties;Expressive difficulties;Receptive difficulties (Pt making statements sounding like word salad. Pt also reports hearing commands from OT that were not said.)   Cognition Arousal/Alertness: Awake/alert Behavior During Therapy: Flat affect Overall Cognitive Status: No family/caregiver present to determine baseline cognitive functioning Area of Impairment: Orientation;Following commands;Safety/judgement;Awareness;Problem solving;Memory                 Orientation Level: Disoriented to;Person;Place;Time;Situation   Memory: Decreased short-term memory;Decreased recall of precautions Following Commands: Follows one step commands inconsistently Safety/Judgement: Decreased awareness of safety;Decreased awareness of deficits Awareness: Intellectual Problem Solving: Slow processing;Decreased initiation;Requires verbal cues;Requires tactile cues;Difficulty sequencing General Comments: hx of dementia, inconsistent ability to follow 1-step commands with need of increased time for processing.  Very impaired memory. Pt unable to state his full name, last name, date of birth or any other orientation questions.  When  asked to demonstrate taking off socks pt did state, "I don't think I should try that." Suggesting some safety awareness.   General Comments  History of falls unknown    Exercises     Shoulder Instructions      Home Living Family/patient expects to be discharged to:: Skilled nursing facility Living Arrangements: Spouse/significant other                               Additional Comments: patient unable to provide details of home situation. Living at home with spouse.  Per RN family all have Covid.      Prior Functioning/Environment Level of Independence: Needs assistance  Gait / Transfers Assistance Needed: Pt reached for RW and used while taking steps in room, suggesting that pt has used at baseline.   Communication / Swallowing Assistance Needed: Pt with dementia and unable to provide any prior level of function. No family present.          OT Problem List: Cardiopulmonary status limiting activity;Decreased activity tolerance;Decreased cognition;Decreased strength;Decreased coordination;Impaired balance (sitting and/or standing);Decreased knowledge of precautions;Decreased knowledge of use of DME or AE;Decreased safety awareness      OT Treatment/Interventions: Self-care/ADL training;Therapeutic exercise;Therapeutic activities;Cognitive remediation/compensation;Patient/family education;DME and/or AE instruction;Balance training    OT Goals(Current goals can be found in the care plan section) Acute Rehab OT Goals OT Goal Formulation: Patient unable to participate in goal setting Potential to Achieve Goals: Fair ADL Goals Pt Will Perform Grooming: standing;with supervision Pt Will Perform Upper Body Dressing: sitting;with supervision Pt Will Perform Lower Body Dressing: with min assist;sitting/lateral leans;sit to/from stand;with caregiver independent in assisting Pt Will Transfer to Toilet: with supervision;ambulating Pt Will Perform Toileting -  Clothing  Manipulation and hygiene: sitting/lateral leans;sit to/from stand;with min assist Additional ADL Goal #1: Pt will demonstrate improved mentation by following 100% of 1-step instructions in order to decrease caregiver burden at home.  OT Frequency: Min 2X/week   Barriers to D/C: Decreased caregiver support  Family all currently sick with COVID and do not think that they can care for pt right now.       Co-evaluation              AM-PAC OT "6 Clicks" Daily Activity     Outcome Measure Help from another person eating meals?: A Little Help from another person taking care of personal grooming?: A Little Help from another person toileting, which includes using toliet, bedpan, or urinal?: Total Help from another person bathing (including washing, rinsing, drying)?: A Lot Help from another person to put on and taking off regular upper body clothing?: A Little Help from another person to put on and taking off regular lower body clothing?: Total 6 Click Score: 13   End of Session Equipment Utilized During Treatment: Gait belt;Rolling walker Nurse Communication: Mobility status;Other (comment) (Nursing assisted finding chair alarm box for room.)  Activity Tolerance: Patient tolerated treatment well Patient left: in chair;with call bell/phone within reach;with chair alarm set  OT Visit Diagnosis: Cognitive communication deficit (R41.841);Unsteadiness on feet (R26.81) Symptoms and signs involving cognitive functions: Other cerebrovascular disease                Time: 1410-1435 OT Time Calculation (min): 25 min Charges:  OT General Charges $OT Visit: 1 Visit OT Evaluation $OT Eval Low Complexity: 1 Low OT Treatments $Self Care/Home Management : 8-22 mins  Anderson [place], [place] Office: [phone] 03/18/2021  Julien Girt 03/18/2021, 2:50 PM

## 2021-03-18 NOTE — Progress Notes (Signed)
PROGRESS NOTE    [name] MALAY Sr.  QHU:[id number] DOB: 1951/02/21 DOA: 03/14/2021 PCP: [name], DO   Brief Narrative:  This 70 years old male with PMH significant for dementia, atrial fibrillation, ulcerative colitis, depression brought in the ED after he was found increasingly confused at home.  Patient was not communicating and not himself for the last 3 days. Patient has come to the ER 3 days ago with confusion and weakness and at that time he was diagnosed with COVID and he was discharged home on oral antiviral medication. He has not been communicating and not following commands.  Due to worsening weakness and encephalopathy patient was brought back in the ED.  CT head is unremarkable.  MRI head is unremarkable, No acute intracranial abnormality.  ABG does not show hypercarbia.  Patient is admitted for possible encephalopathy due to [place].  Assessment & Plan:   Principal Problem:   Acute encephalopathy Active Problems:   Dementia (HCC)   Hypothyroidism   Chronic ulcerative pancolitis (HCC)   Chronic diastolic CHF (congestive heart failure) (HCC)   Paroxysmal atrial fibrillation (HCC)   Weakness  Acute encephalopathy in the setting of COVID infection: Patient was brought in the ED 3 days ago,  found to have COVID and was discharged on antiviral medication. Its most likely related to COVID, patient symptoms has not improved in last 3 days. MRI: No acute intracranial abnormality.  ABGs no hypercarbia. EEG no evidence of seizures. UA unremarkable, chest x-ray unremarkable.  Blood cultures no growth so far. Bedside swallow evaluation recommended dysphagia 2 thin liquids. Patient seems improved in his mental status.  Appears at his baseline.  COVID infection: Patient is not hypoxic at this time,  Closely monitor for worsening respiratory symptoms.   Completed  IV remdesivir ( day 4/4)  Hypothyroidism :  Continue levothyroxine.  History of A. Fib : Continue  Eliquis. Continue metoprolol 12.5 mg twice daily. HR controlled.  History of ulcerative colitis : continue Imuran  History of depression: resume Zyprexa and Depakote.  DVT prophylaxis: Heparin Code Status: DNR Family Communication: No family at bed side. Disposition Plan:   Status is: Inpatient  Remains inpatient appropriate because:Inpatient level of care appropriate due to severity of illness  Dispo: The patient is from: Home              Anticipated d/c is to:  SNF              Patient currently is not medically stable to d/c.   Difficult to place patient No  Consultants:  None  Procedures: MRI /CT Head Antimicrobials:  Anti-infectives (From admission, onward)    Start     Dose/Rate Route Frequency Ordered Stop   03/15/21 1000  remdesivir 100 mg in sodium chloride 0.9 % 100 mL IVPB       See Hyperspace for full Linked Orders Report.   100 mg 200 mL/hr over 30 Minutes Intravenous Daily 03/14/21 1455 03/18/21 1040   03/14/21 1600  remdesivir 200 mg in sodium chloride 0.9% 250 mL IVPB       See Hyperspace for full Linked Orders Report.   200 mg 580 mL/hr over 30 Minutes Intravenous Once 03/14/21 1455 03/14/21 1630       Subjective: Patient was seen and examined at bedside.  Overnight events noted.   Patient appears improved,  he is alert and oriented x 2.  He seems at his baseline. He denies any chest pain, shortness of breath, dizziness, palpitations.  Objective: Vitals:   03/17/21 1500 03/17/21 2220 03/18/21 0533 03/18/21 0824  BP: 123/68 128/75 103/76 108/86  Pulse: 92 86 63 78  Resp:  19 18   Temp: 97.7 F (36.5 C) 97.6 F (36.4 C) 97.6 F (36.4 C)   TempSrc: Axillary Oral Oral   SpO2:  99% 97%   Weight:      Height:        Intake/Output Summary (Last 24 hours) at 03/18/2021 1128 Last data filed at 03/18/2021 0938 Gross per 24 hour  Intake 240 ml  Output 950 ml  Net -710 ml   Filed Weights   03/15/21 0117  Weight: 102.9 kg     Examination:  General exam: Appears calm and comfortable , not in any acute distress. Respiratory system: Clear to auscultation. Respiratory effort normal. Cardiovascular system: S1 & S2 heard, RRR. No JVD, murmurs, rubs, gallops or clicks. No pedal edema. Gastrointestinal system: Abdomen is nondistended, soft and nontender. No organomegaly or masses felt. Normal bowel sounds heard. Central nervous system: Alert and oriented x 2 . No focal neurological deficits. Extremities: Symmetric 5 x 5 power.  No edema, no cyanosis, no clubbing. Skin: No rashes, lesions or ulcers Psychiatry: Judgement and insight appear normal. Mood & affect appropriate.     Data Reviewed: I have personally reviewed following labs and imaging studies  CBC: Recent Labs  Lab 03/14/21 1058 03/15/21 0342 03/16/21 0357 03/17/21 0412 03/18/21 0343  WBC 4.2 3.7* 3.0* 3.9* 3.7*  NEUTROABS 3.3 2.5 1.6* 2.5 2.1  HGB 14.0 14.5 14.6 15.0 14.9  HCT 40.4 42.1 42.6 43.4 43.3  MCV 104.1* 103.7* 102.2* 102.4* 102.9*  PLT 138* 127* 121* 136* 741*   Basic Metabolic Panel: Recent Labs  Lab 03/14/21 1520 03/15/21 0342 03/16/21 0357 03/17/21 0412 03/18/21 0343  NA 133* 133* 136 138 139  K 4.0 3.7 3.5 3.5 3.5  CL 92* 94* 98 99 100  CO2 30 28 29 29  33*  GLUCOSE 92 85 87 109* 97  BUN 9 10 13 16 19   CREATININE 0.94 0.70 0.56* 0.84 1.06  CALCIUM 8.4* 8.4* 8.5* 8.6* 8.5*  MG  --   --   --  2.0  --   PHOS  --   --   --  3.6  --    GFR: Estimated Creatinine Clearance: 73.1 mL/min (by C-G formula based on SCr of 1.06 mg/dL). Liver Function Tests: Recent Labs  Lab 03/14/21 1058 03/15/21 0342 03/16/21 0357 03/17/21 0412 03/18/21 0343  AST 86* 35 29 30 26   ALT 21 19 17 16 17   ALKPHOS 33* 31* 32* 33* 36*  BILITOT 1.9* 1.0 0.9 0.9 1.0  PROT 6.2* 5.9* 6.0* 6.0* 5.8*  ALBUMIN 3.1* 3.0* 3.0* 3.2* 3.2*   No results for input(s): LIPASE, AMYLASE in the last 168 hours. Recent Labs  Lab 03/14/21 1104  AMMONIA 32    Coagulation Profile: Recent Labs  Lab 03/12/21 1939  INR 1.2   Cardiac Enzymes: No results for input(s): CKTOTAL, CKMB, CKMBINDEX, TROPONINI in the last 168 hours. BNP (last 3 results) Recent Labs    12/07/20 1223  PROBNP 87.0   HbA1C: Recent Labs    03/16/21 0357  HGBA1C 4.7*   CBG: Recent Labs  Lab 03/17/21 1558 03/17/21 2217 03/18/21 0537 03/18/21 0741 03/18/21 1126  GLUCAP 99 93 92 86 109*   Lipid Profile: No results for input(s): CHOL, HDL, LDLCALC, TRIG, CHOLHDL, LDLDIRECT in the last 72 hours.  Thyroid Function Tests: No results for  input(s): TSH, T4TOTAL, FREET4, T3FREE, THYROIDAB in the last 72 hours. Anemia Panel: No results for input(s): VITAMINB12, FOLATE, FERRITIN, TIBC, IRON, RETICCTPCT in the last 72 hours.  Sepsis Labs: Recent Labs  Lab 03/12/21 1939 03/12/21 2114 03/14/21 1111 03/14/21 1155  PROCALCITON  --   --  <0.10  --   LATICACIDVEN 2.1* 1.8  --  1.3    Recent Results (from the past 240 hour(s))  Blood Culture (routine x 2)     Status: None   Collection Time: 03/12/21  7:35 PM   Specimen: BLOOD  Result Value Ref Range Status   Specimen Description   Final    BLOOD LEFT ANTECUBITAL Performed at [facility], [place] [address] [place], [place] [address]    Special Requests   Final    BOTTLES DRAWN AEROBIC AND ANAEROBIC Blood Culture results may not be optimal due to an excessive volume of blood received in culture bottles Performed at [place] [address] [place], [place] [address]    Culture   Final    NO GROWTH 5 DAYS Performed at [facility], [place] [address], [place], [place] [address]    Report Status 03/18/2021 FINAL  Final  Blood Culture (routine x 2)     Status: None   Collection Time: 03/12/21  7:38 PM   Specimen: BLOOD  Result Value Ref Range Status   Specimen Description   Final    BLOOD RIGHT ANTECUBITAL Performed at [place]  [address] [place], [place] [address]    Special Requests   Final    BOTTLES DRAWN AEROBIC AND ANAEROBIC Blood Culture adequate volume Performed at [place] [address] [place], [place] [address]    Culture   Final    NO GROWTH 5 DAYS Performed at [facility], [place] [address] [place],  [address]    Report Status 03/18/2021 FINAL  Final  Resp Panel by RT-PCR (Flu A&B, Covid) Nasopharyngeal Swab     Status: Abnormal   Collection Time: 03/12/21  7:46 PM   Specimen: Nasopharyngeal Swab; Nasopharyngeal(NP) swabs in vial transport medium  Result Value Ref Range Status   SARS Coronavirus 2 by RT PCR POSITIVE (A) NEGATIVE Final    Comment: RESULT CALLED TO, READ BACK BY AND VERIFIED WITH: REBECCA GOODWIN AT 2053 ON 03/12/21 BY MAJ (NOTE) SARS-CoV-2 target nucleic acids are DETECTED.  The SARS-CoV-2 RNA is generally detectable in upper respiratory specimens during the acute phase of infection. Positive results are indicative of the presence of the identified virus, but do not rule out bacterial infection or co-infection with other pathogens not detected by the test. Clinical correlation with patient history and other diagnostic information is necessary to determine patient infection status. The expected result is Negative.  Fact Sheet for Patients: EntrepreneurPulse.com.au  Fact Sheet for Healthcare Providers: IncredibleEmployment.be  This test is not yet approved or cleared by the [place] FDA and  has been authorized for detection and/or diagnosis of SARS-CoV-2 by FDA under an Emergency Use Authorization (EUA).  This EUA will remain in effect (meaning this te st can be used) for the duration of  the COVID-19 declaration under Section 564(b)(1) of the Act, 21 U.S.C. section 360bbb-3(b)(1), unless the authorization is terminated or revoked sooner.     Influenza A by PCR NEGATIVE NEGATIVE Final    Influenza B by PCR NEGATIVE NEGATIVE Final    Comment: (NOTE) The Xpert Xpress  SARS-CoV-2/FLU/RSV plus assay is intended as an aid in the diagnosis of influenza from Nasopharyngeal swab specimens and should not be used as a sole basis for treatment. Nasal washings and aspirates are unacceptable for Xpert Xpress SARS-CoV-2/FLU/RSV testing.  Fact Sheet for Patients: EntrepreneurPulse.com.au  Fact Sheet for Healthcare Providers: IncredibleEmployment.be  This test is not yet approved or cleared by the [place] FDA and has been authorized for detection and/or diagnosis of SARS-CoV-2 by FDA under an Emergency Use Authorization (EUA). This EUA will remain in effect (meaning this test can be used) for the duration of the COVID-19 declaration under Section 564(b)(1) of the Act, 21 U.S.C. section 360bbb-3(b)(1), unless the authorization is terminated or revoked.  Performed at [facility], [place] [address] [place], [place] [address]   Urine culture     Status: None   Collection Time: 03/12/21  8:09 PM   Specimen: In/Out Cath Urine  Result Value Ref Range Status   Specimen Description   Final    IN/OUT CATH URINE Performed at [place] [address], [place], [place] [address]    Special Requests   Final    NONE Performed at [facility], [place] [address], [place], [place] [address]    Culture   Final    NO GROWTH Performed at [facility], [place] [address], [place], [place] [address]    Report Status 03/14/2021 FINAL  Final  Blood Culture (routine x 2)     Status: None (Preliminary result)   Collection Time: 03/14/21 11:11 AM   Specimen: BLOOD  Result Value Ref Range Status   Specimen Description   Final    BLOOD LEFT ANTECUBITAL Performed at [place] [address] [place], [place] [address]    Special Requests   Final    BOTTLES DRAWN AEROBIC AND  ANAEROBIC Blood Culture results may not be optimal due to an inadequate volume of blood received in culture bottles Performed at [place] [address], [place], [place] [address]    Culture   Final    NO GROWTH 4 DAYS Performed at [facility], [place] [address] [place], [place] [address]    Report Status PENDING  Incomplete    Radiology Studies: No results found.  Scheduled Meds:  apixaban  5 mg Oral BID   atorvastatin  20 mg Oral Daily   azaTHIOprine  200 mg Oral Daily   divalproex  750 mg Oral Q12H   DULoxetine  60 mg Oral Daily   furosemide  40 mg Oral Once per day on Sun Thu   insulin aspart  0-15 Units Subcutaneous TID WC   insulin aspart  0-5 Units Subcutaneous QHS   levothyroxine  50 mcg Oral Q0600   mouth rinse  15 mL Mouth Rinse BID   melatonin  10 mg Oral QHS   metoprolol tartrate  12.5 mg Oral BID   OLANZapine  5 mg Oral Daily   And   OLANZapine  15 mg Oral QHS   tamsulosin  0.4 mg Oral Daily   Continuous Infusions:     LOS: 4 days    Time spent: 25 mins    Shawna Clamp, MD [facility]   If 7PM-7AM, please contact night-coverage

## 2021-03-19 DIAGNOSIS — G9340 Encephalopathy, unspecified: Secondary | ICD-10-CM | POA: Diagnosis not present

## 2021-03-19 LAB — CBC WITH DIFFERENTIAL/PLATELET
Abs Immature Granulocytes: 0.13 10*3/uL — ABNORMAL HIGH (ref 0.00–0.07)
Basophils Absolute: 0 10*3/uL (ref 0.0–0.1)
Basophils Relative: 1 %
Eosinophils Absolute: 0.1 10*3/uL (ref 0.0–0.5)
Eosinophils Relative: 2 %
HCT: 43.4 % (ref 39.0–52.0)
Hemoglobin: 14.8 g/dL (ref 13.0–17.0)
Immature Granulocytes: 3 %
Lymphocytes Relative: 22 %
Lymphs Abs: 0.9 10*3/uL (ref 0.7–4.0)
MCH: 35.5 pg — ABNORMAL HIGH (ref 26.0–34.0)
MCHC: 34.1 g/dL (ref 30.0–36.0)
MCV: 104.1 fL — ABNORMAL HIGH (ref 80.0–100.0)
Monocytes Absolute: 0.6 10*3/uL (ref 0.1–1.0)
Monocytes Relative: 14 %
Neutro Abs: 2.5 10*3/uL (ref 1.7–7.7)
Neutrophils Relative %: 58 %
Platelets: 135 10*3/uL — ABNORMAL LOW (ref 150–400)
RBC: 4.17 MIL/uL — ABNORMAL LOW (ref 4.22–5.81)
RDW: 15.3 % (ref 11.5–15.5)
WBC: 4.2 10*3/uL (ref 4.0–10.5)
nRBC: 0 % (ref 0.0–0.2)

## 2021-03-19 LAB — MAGNESIUM: Magnesium: 2.1 mg/dL (ref 1.7–2.4)

## 2021-03-19 LAB — COMPREHENSIVE METABOLIC PANEL
ALT: 14 U/L (ref 0–44)
AST: 19 U/L (ref 15–41)
Albumin: 3.1 g/dL — ABNORMAL LOW (ref 3.5–5.0)
Alkaline Phosphatase: 39 U/L (ref 38–126)
Anion gap: 8 (ref 5–15)
BUN: 18 mg/dL (ref 8–23)
CO2: 32 mmol/L (ref 22–32)
Calcium: 8.7 mg/dL — ABNORMAL LOW (ref 8.9–10.3)
Chloride: 101 mmol/L (ref 98–111)
Creatinine, Ser: 0.95 mg/dL (ref 0.61–1.24)
GFR, Estimated: >60 mL/min (ref >60)
Glucose, Bld: 101 mg/dL — ABNORMAL HIGH (ref 70–99)
Potassium: 3.6 mmol/L (ref 3.5–5.1)
Sodium: 141 mmol/L (ref 135–145)
Total Bilirubin: 1.3 mg/dL — ABNORMAL HIGH (ref 0.3–1.2)
Total Protein: 5.8 g/dL — ABNORMAL LOW (ref 6.5–8.1)

## 2021-03-19 LAB — CULTURE, BLOOD (ROUTINE X 2): Culture: NO GROWTH

## 2021-03-19 LAB — C-REACTIVE PROTEIN: CRP: 0.9 mg/dL (ref <1.0)

## 2021-03-19 LAB — PHOSPHORUS: Phosphorus: 3.9 mg/dL (ref 2.5–4.6)

## 2021-03-19 LAB — GLUCOSE, CAPILLARY
Glucose-Capillary: 107 mg/dL — ABNORMAL HIGH (ref 70–99)
Glucose-Capillary: 114 mg/dL — ABNORMAL HIGH (ref 70–99)
Glucose-Capillary: 110 mg/dL — ABNORMAL HIGH (ref 70–99)
Glucose-Capillary: 94 mg/dL (ref 70–99)

## 2021-03-19 LAB — D-DIMER, QUANTITATIVE: D-Dimer, Quant: 0.32 ug/mL-FEU (ref 0.00–0.50)

## 2021-03-19 NOTE — Progress Notes (Signed)
PROGRESS NOTE    [name] Sr.  SEG:[id number] DOB: [birth year] DOA: 03/14/2021 PCP: [name], DO   Brief Narrative:  This 70 years old male with PMH significant for dementia, atrial fibrillation, ulcerative colitis, depression brought in the ED after he was found increasingly confused at home.  Patient was not communicating and not himself for the last 3 days. Patient has come to the ER 3 days ago with confusion and weakness and at that time he was diagnosed with COVID and he was discharged home on oral antiviral medication. He has not been communicating and not following commands.  Due to worsening weakness and encephalopathy patient was brought back in the ED.  CT head is unremarkable.  MRI head is unremarkable, No acute intracranial abnormality.  ABG does not show hypercarbia.  Patient is admitted for possible encephalopathy due to [place].  Assessment & Plan:   Principal Problem:   Acute encephalopathy Active Problems:   Dementia (HCC)   Hypothyroidism   Chronic ulcerative pancolitis (HCC)   Chronic diastolic CHF (congestive heart failure) (HCC)   Paroxysmal atrial fibrillation (HCC)   Weakness  Acute encephalopathy in the setting of COVID infection: Patient was brought in the ED 3 days ago,  found to have COVID and was discharged on antiviral medication. Its most likely related to COVID, patient symptoms has not improved in last 3 days. MRI: No acute intracranial abnormality.  ABGs no hypercarbia. EEG no evidence of seizures. UA unremarkable, chest x-ray unremarkable.  Blood cultures no growth so far. Bedside swallow evaluation recommended dysphagia 2 thin liquids. Patient seems improved in his mental status.  Appears at his baseline.  COVID infection: Patient is not hypoxic at this time,  Closely monitor for worsening respiratory symptoms.   Completed  IV remdesivir ( day 4/4).  Hypothyroidism :  Continue levothyroxine.  History of A. Fib : Continue  Eliquis. Continue metoprolol 12.5 mg twice daily. HR controlled.  History of ulcerative colitis : continue Imuran  History of depression: Continue Depakote, Zyprexa was discontinued since patient was more lethargic in am. was more lethargic in the morning.  DVT prophylaxis: Heparin Code Status: DNR Family Communication: No family at bed side. Disposition Plan:   Status is: Inpatient  Remains inpatient appropriate because:Inpatient level of care appropriate due to severity of illness  Dispo: The patient is from: Home              Anticipated d/c is to:  SNF ( 10 days post COVID dx. for facilities to accept in SNF) 7/14              Patient currently is not medically stable to d/c.   Difficult to place patient No  Consultants:  None  Procedures: MRI /CT Head Antimicrobials:  Anti-infectives (From admission, onward)    Start     Dose/Rate Route Frequency Ordered Stop   03/15/21 1000  remdesivir 100 mg in sodium chloride 0.9 % 100 mL IVPB       See Hyperspace for full Linked Orders Report.   100 mg 200 mL/hr over 30 Minutes Intravenous Daily 03/14/21 1455 03/18/21 1040   03/14/21 1600  remdesivir 200 mg in sodium chloride 0.9% 250 mL IVPB       See Hyperspace for full Linked Orders Report.   200 mg 580 mL/hr over 30 Minutes Intravenous Once 03/14/21 1455 03/14/21 1630       Subjective: Patient was seen and examined at bedside.  Overnight events noted.  Patient  is lethargic in the morning. Patient appears improved,  he is alert and oriented x 2.  Zyprexa was discontinued. He denies any chest pain, shortness of breath, dizziness, palpitations.  Objective: Vitals:   03/18/21 0824 03/18/21 1128 03/18/21 2032 03/19/21 0415  BP: 108/86 107/72 (!) 136/92 102/70  Pulse: 78 75 75 73  Resp:  18 19 18   Temp:  (!) 97.5 F (36.4 C) 98.1 F (36.7 C) 97.6 F (36.4 C)  TempSrc:  Oral Oral Oral  SpO2:  96% 96% 100%  Weight:      Height:        Intake/Output Summary (Last 24  hours) at 03/19/2021 1159 Last data filed at 03/19/2021 0905 Gross per 24 hour  Intake 780 ml  Output 550 ml  Net 230 ml   Filed Weights   03/15/21 0117  Weight: 102.9 kg    Examination:  General exam: Appears calm and comfortable , not in any acute distress.  Deconditioned Respiratory system: Clear to auscultation. Respiratory effort normal. Cardiovascular system: S1 & S2 heard, RRR. No JVD, murmurs, rubs, gallops or clicks. No pedal edema. Gastrointestinal system: Abdomen is nondistended, soft and nontender. No organomegaly or masses felt. Normal bowel sounds heard. Central nervous system: Alert and oriented x 2 . No focal neurological deficits. Extremities: Symmetric 5 x 5 power.  No edema, no cyanosis, no clubbing. Skin: No rashes, lesions or ulcers Psychiatry: Judgement and insight appear normal. Mood & affect appropriate.     Data Reviewed: I have personally reviewed following labs and imaging studies  CBC: Recent Labs  Lab 03/15/21 0342 03/16/21 0357 03/17/21 0412 03/18/21 0343 03/19/21 0355  WBC 3.7* 3.0* 3.9* 3.7* 4.2  NEUTROABS 2.5 1.6* 2.5 2.1 2.5  HGB 14.5 14.6 15.0 14.9 14.8  HCT 42.1 42.6 43.4 43.3 43.4  MCV 103.7* 102.2* 102.4* 102.9* 104.1*  PLT 127* 121* 136* 125* 637*   Basic Metabolic Panel: Recent Labs  Lab 03/15/21 0342 03/16/21 0357 03/17/21 0412 03/18/21 0343 03/19/21 0355  NA 133* 136 138 139 141  K 3.7 3.5 3.5 3.5 3.6  CL 94* 98 99 100 101  CO2 28 29 29  33* 32  GLUCOSE 85 87 109* 97 101*  BUN 10 13 16 19 18   CREATININE 0.70 0.56* 0.84 1.06 0.95  CALCIUM 8.4* 8.5* 8.6* 8.5* 8.7*  MG  --   --  2.0  --  2.1  PHOS  --   --  3.6  --  3.9   GFR: Estimated Creatinine Clearance: 81.5 mL/min (by C-G formula based on SCr of 0.95 mg/dL). Liver Function Tests: Recent Labs  Lab 03/15/21 0342 03/16/21 0357 03/17/21 0412 03/18/21 0343 03/19/21 0355  AST 35 29 30 26 19   ALT 19 17 16 17 14   ALKPHOS 31* 32* 33* 36* 39  BILITOT 1.0 0.9 0.9  1.0 1.3*  PROT 5.9* 6.0* 6.0* 5.8* 5.8*  ALBUMIN 3.0* 3.0* 3.2* 3.2* 3.1*   No results for input(s): LIPASE, AMYLASE in the last 168 hours. Recent Labs  Lab 03/14/21 1104  AMMONIA 32   Coagulation Profile: Recent Labs  Lab 03/12/21 1939  INR 1.2   Cardiac Enzymes: No results for input(s): CKTOTAL, CKMB, CKMBINDEX, TROPONINI in the last 168 hours. BNP (last 3 results) Recent Labs    12/07/20 1223  PROBNP 87.0   HbA1C: No results for input(s): HGBA1C in the last 72 hours.  CBG: Recent Labs  Lab 03/18/21 0741 03/18/21 1126 03/18/21 1650 03/18/21 2029 03/19/21 0824  GLUCAP 86  109* 107* 124* 107*   Lipid Profile: No results for input(s): CHOL, HDL, LDLCALC, TRIG, CHOLHDL, LDLDIRECT in the last 72 hours.  Thyroid Function Tests: No results for input(s): TSH, T4TOTAL, FREET4, T3FREE, THYROIDAB in the last 72 hours. Anemia Panel: No results for input(s): VITAMINB12, FOLATE, FERRITIN, TIBC, IRON, RETICCTPCT in the last 72 hours.  Sepsis Labs: Recent Labs  Lab 03/12/21 1939 03/12/21 2114 03/14/21 1111 03/14/21 1155  PROCALCITON  --   --  <0.10  --   LATICACIDVEN 2.1* 1.8  --  1.3    Recent Results (from the past 240 hour(s))  Blood Culture (routine x 2)     Status: None   Collection Time: 03/12/21  7:35 PM   Specimen: BLOOD  Result Value Ref Range Status   Specimen Description   Final    BLOOD LEFT ANTECUBITAL Performed at [facility], [place] [address] [place], [place] [address]    Special Requests   Final    BOTTLES DRAWN AEROBIC AND ANAEROBIC Blood Culture results may not be optimal due to an excessive volume of blood received in culture bottles Performed at [place] [address] [place], [place] [address]    Culture   Final    NO GROWTH 5 DAYS Performed at [facility], [place] [address], [place], [place] [address]    Report Status 03/18/2021 FINAL  Final  Blood Culture (routine x 2)     Status: None    Collection Time: 03/12/21  7:38 PM   Specimen: BLOOD  Result Value Ref Range Status   Specimen Description   Final    BLOOD RIGHT ANTECUBITAL Performed at [facility] [address], [place], [place] [address]    Special Requests   Final    BOTTLES DRAWN AEROBIC AND ANAEROBIC Blood Culture adequate volume Performed at [facility] [address], [place], [place] [address]    Culture   Final    NO GROWTH 5 DAYS Performed at [facility], [place] [address], [place],  [address]    Report Status 03/18/2021 FINAL  Final  Resp Panel by RT-PCR (Flu A&B, Covid) Nasopharyngeal Swab     Status: Abnormal   Collection Time: 03/12/21  7:46 PM   Specimen: Nasopharyngeal Swab; Nasopharyngeal(NP) swabs in vial transport medium  Result Value Ref Range Status   SARS Coronavirus 2 by RT PCR POSITIVE (A) NEGATIVE Final    Comment: RESULT CALLED TO, READ BACK BY AND VERIFIED WITH: REBECCA GOODWIN AT 2053 ON 03/12/21 BY MAJ (NOTE) SARS-CoV-2 target nucleic acids are DETECTED.  The SARS-CoV-2 RNA is generally detectable in upper respiratory specimens during the acute phase of infection. Positive results are indicative of the presence of the identified virus, but do not rule out bacterial infection or co-infection with other pathogens not detected by the test. Clinical correlation with patient history and other diagnostic information is necessary to determine patient infection status. The expected result is Negative.  Fact Sheet for Patients: EntrepreneurPulse.com.au  Fact Sheet for Healthcare Providers: IncredibleEmployment.be  This test is not yet approved or cleared by the [place] FDA and  has been authorized for detection and/or diagnosis of SARS-CoV-2 by FDA under an Emergency Use Authorization (EUA).  This EUA will remain in effect (meaning this te st can be used) for the duration of  the  COVID-19 declaration under Section 564(b)(1) of the Act, 21 U.S.C. section 360bbb-3(b)(1), unless the authorization is terminated or  revoked sooner.     Influenza A by PCR NEGATIVE NEGATIVE Final   Influenza B by PCR NEGATIVE NEGATIVE Final    Comment: (NOTE) The Xpert Xpress SARS-CoV-2/FLU/RSV plus assay is intended as an aid in the diagnosis of influenza from Nasopharyngeal swab specimens and should not be used as a sole basis for treatment. Nasal washings and aspirates are unacceptable for Xpert Xpress SARS-CoV-2/FLU/RSV testing.  Fact Sheet for Patients: EntrepreneurPulse.com.au  Fact Sheet for Healthcare Providers: IncredibleEmployment.be  This test is not yet approved or cleared by the [place] FDA and has been authorized for detection and/or diagnosis of SARS-CoV-2 by FDA under an Emergency Use Authorization (EUA). This EUA will remain in effect (meaning this test can be used) for the duration of the COVID-19 declaration under Section 564(b)(1) of the Act, 21 U.S.C. section 360bbb-3(b)(1), unless the authorization is terminated or revoked.  Performed at [facility], [place] [address], [place], [place] [address]   Urine culture     Status: None   Collection Time: 03/12/21  8:09 PM   Specimen: In/Out Cath Urine  Result Value Ref Range Status   Specimen Description   Final    IN/OUT CATH URINE Performed at [facility] [address] [place], [place] [address]    Special Requests   Final    NONE Performed at [facility], [place] [address], [place], [place] [address]    Culture   Final    NO GROWTH Performed at [facility], [place] [address], [place], [place] [address]    Report Status 03/14/2021 FINAL  Final  Blood Culture (routine x 2)     Status: None   Collection Time: 03/14/21 11:11 AM   Specimen: BLOOD  Result Value Ref Range Status   Specimen Description    Final    BLOOD LEFT ANTECUBITAL Performed at [facility] [address] [place], [place] [address]    Special Requests   Final    BOTTLES DRAWN AEROBIC AND ANAEROBIC Blood Culture results may not be optimal due to an inadequate volume of blood received in culture bottles Performed at [facility] [address], [place], [place] [address]    Culture   Final    NO GROWTH 5 DAYS Performed at [facility], [place] [address], [place], [place] [address]    Report Status 03/19/2021 FINAL  Final    Radiology Studies: No results found.  Scheduled Meds:  apixaban  5 mg Oral BID   atorvastatin  20 mg Oral Daily   azaTHIOprine  200 mg Oral Daily   divalproex  750 mg Oral Q12H   DULoxetine  60 mg Oral Daily   furosemide  40 mg Oral Once per day on Sun Thu   insulin aspart  0-15 Units Subcutaneous TID WC   insulin aspart  0-5 Units Subcutaneous QHS   levothyroxine  50 mcg Oral Q0600   mouth rinse  15 mL Mouth Rinse BID   melatonin  10 mg Oral QHS   metoprolol tartrate  12.5 mg Oral BID   tamsulosin  0.4 mg Oral Daily   Continuous Infusions:     LOS: 5 days    Time spent: 25 mins  Shawna Clamp, MD [facility]   If 7PM-7AM, please contact night-coverage

## 2021-03-19 NOTE — Progress Notes (Signed)
Noted lethargy and responds to pain only. VS as follows: BP: 102/70, heart rate 73, SP02 100, RR 18 breaths per minute, might be secondary to Zyprexia. Provider updated.

## 2021-03-20 DIAGNOSIS — G9340 Encephalopathy, unspecified: Secondary | ICD-10-CM | POA: Diagnosis not present

## 2021-03-20 LAB — GLUCOSE, CAPILLARY
Glucose-Capillary: 102 mg/dL — ABNORMAL HIGH (ref 70–99)
Glucose-Capillary: 145 mg/dL — ABNORMAL HIGH (ref 70–99)
Glucose-Capillary: 90 mg/dL (ref 70–99)
Glucose-Capillary: 97 mg/dL (ref 70–99)

## 2021-03-20 NOTE — Progress Notes (Signed)
Physical Therapy Treatment Patient Details Name: [name] MOSQUEDA Sr. MRN: [id number] DOB: 11-19-1950 Today's Date: 03/20/2021    History of Present Illness 70 years old male brought in the ED after he was found increasingly confused at home and admitted for acute encephalopathy in setting of Covid-19 infection.   PMH significant for dementia, atrial fibrillation, ulcerative colitis, depression    PT Comments    Pt more alert today but still terribly confused (hx of dementia). Multimodal cueing required for participation. Pt would not agree to sitting EOB/OOB but he did agree to bed mobility and bed level ROM exercises. No family present during session. Continue to recommend SNF for rehab.     Follow Up Recommendations  SNF     Equipment Recommendations  None recommended by PT    Recommendations for Other Services       Precautions / Restrictions Precautions Precautions: Fall Restrictions Weight Bearing Restrictions: No    Mobility  Bed Mobility Overal bed mobility: Needs Assistance Bed Mobility: Rolling Rolling: Mod assist         General bed mobility comments: Mod A for trunk and LEs to roll to L and R sides. Multimodal (verbal and and hand over hand) cueing required. Increased time. Pt declined sitting EOB/OOB on today.    Transfers                    Ambulation/Gait                 Stairs             Wheelchair Mobility    Modified Rankin (Stroke Patients Only)       Balance                                            Cognition Arousal/Alertness: Awake/alert Behavior During Therapy: Flat affect Overall Cognitive Status: No family/caregiver present to determine baseline cognitive functioning Area of Impairment: Orientation;Following commands;Safety/judgement;Awareness;Problem solving;Memory                 Orientation Level: Disoriented to;Place;Time;Situation   Memory: Decreased short-term  memory;Decreased recall of precautions Following Commands: Follows one step commands inconsistently Safety/Judgement: Decreased awareness of safety;Decreased awareness of deficits   Problem Solving: Slow processing;Decreased initiation;Requires verbal cues;Requires tactile cues;Difficulty sequencing General Comments: hx of dementia, inconsistent ability to follow 1-step commands with need of increased time for processing.  Very impaired memory.      Exercises General Exercises - Upper Extremity Shoulder Flexion: AAROM;Both;10 reps;Supine General Exercises - Lower Extremity Heel Slides: AAROM;Both;10 reps;Supine Hip ABduction/ADduction: AAROM;Both;10 reps;Supine    General Comments        Pertinent Vitals/Pain Pain Assessment: Faces Faces Pain Scale: Hurts little more Pain Location: bil shoulders Pain Intervention(s): Limited activity within patient's tolerance;Monitored during session;Repositioned    Home Living                      Prior Function            PT Goals (current goals can now be found in the care plan section) Progress towards PT goals: Progressing toward goals    Frequency    Min 2X/week      PT Plan Current plan remains appropriate    Co-evaluation              AM-PAC PT "6 Clicks"  Mobility   Outcome Measure  Help needed turning from your back to your side while in a flat bed without using bedrails?: Total Help needed moving from lying on your back to sitting on the side of a flat bed without using bedrails?: Total Help needed moving to and from a bed to a chair (including a wheelchair)?: Total Help needed standing up from a chair using your arms (e.g., wheelchair or bedside chair)?: Total Help needed to walk in hospital room?: Total Help needed climbing 3-5 steps with a railing? : Total 6 Click Score: 6    End of Session   Activity Tolerance: Patient tolerated treatment well Patient left: in bed;with call bell/phone within  reach;with bed alarm set   PT Visit Diagnosis: Muscle weakness (generalized) (M62.81)     Time: 1478-2956 PT Time Calculation (min) (ACUTE ONLY): 10 min  Charges:  $Therapeutic Exercise: 8-22 mins                         Doreatha Massed, PT Acute Rehabilitation  Office: [phone] Pager: [phone]

## 2021-03-20 NOTE — TOC Progression Note (Signed)
Transition of Care (TOC) - Progression Note    Patient Details  Name: [name] BOUGHER Sr. MRN: [id number] Date of Birth: 1951/07/25  Transition of Care [facility]) CM/SW Contact  Ross Ludwig, [place] Phone Number: 03/20/2021, 5:41 PM  Clinical Narrative:     CSW spoke to patient's wife, she is interested in patient going to [facility] in [place] if patient gets approved. CSW attempted to call [facility] admissions worker, left a message on voice mail.  Patient used 28 days at [facility], which he was there from 5/11 till 6/8.  Patient' wife aware if she chooses SNF, they will be copay days.  Wife interested in cost.  If not SNF, he is open to [facility] and they can continue to follow patient with home health services.   Expected Discharge Plan: [place] Barriers to Discharge: Continued Medical Work up  Expected Discharge Plan and Services Expected Discharge Plan: [place] In-house Referral: Clinical Social Work   Post Acute Care Choice: [place] Living arrangements for the past 2 months: Single Family Home                                       Social Determinants of Health (SDOH) Interventions    Readmission Risk Interventions Readmission Risk Prevention Plan 02/18/2019  Transportation Screening Complete  PCP or Specialist Appt within 3-5 Days Complete  HRI or [place] Complete  Social Work Consult for [place] Planning/Counseling Complete  Palliative Care Screening Not Applicable  Medication Review [employer] photographer) Complete  Some recent data might be hidden

## 2021-03-20 NOTE — Progress Notes (Signed)
PROGRESS NOTE    [name] Sr.  PJS:[id number] DOB: 03/07/1951 DOA: 03/14/2021 PCP: [name], DO   Brief Narrative:  This 70 years old male with PMH significant for dementia, atrial fibrillation, ulcerative colitis, depression brought in the ED after he was found increasingly confused at home.  Patient was not communicating and not himself for the last 3 days. Patient has come to the ER 3 days ago with confusion and weakness and at that time he was diagnosed with COVID and he was discharged home on oral antiviral medication. He has not been communicating and not following commands.  Due to worsening weakness and encephalopathy patient was brought back in the ED.  CT head is unremarkable.  MRI head is unremarkable, No acute intracranial abnormality.  ABG does not show hypercarbia.  Patient is admitted for possible encephalopathy due to [place].  Encephalopathy has resolved.  Assessment & Plan:   Principal Problem:   Acute encephalopathy Active Problems:   Dementia (HCC)   Hypothyroidism   Chronic ulcerative pancolitis (HCC)   Chronic diastolic CHF (congestive heart failure) (HCC)   Paroxysmal atrial fibrillation (HCC)   Weakness  Acute encephalopathy in the setting of COVID infection: Patient was brought in the ED 3 days ago,  found to have COVID and was discharged on antiviral medication. Its most likely related to COVID, patient symptoms has not improved in last 3 days. MRI: No acute intracranial abnormality.  ABGs no hypercarbia. EEG no evidence of seizures. UA unremarkable, chest x-ray unremarkable.  Blood cultures no growth so far. Bedside swallow evaluation recommended dysphagia 2 thin liquids. Patient improved in his mental status.  Appears at his baseline.  COVID infection: Patient is not hypoxic at this time,  Closely monitor for worsening respiratory symptoms.   Completed  IV remdesivir ( day 4/4).  Hypothyroidism :  Continue levothyroxine.  History of A. Fib  : Continue Eliquis. Continue metoprolol 12.5 mg twice daily. HR controlled.  History of ulcerative colitis : continue Imuran  History of depression: Continue Depakote, Zyprexa was discontinued since patient was more lethargic in am.  He is more alert and cooperative.  DVT prophylaxis: Heparin Code Status: DNR Family Communication: No family at bed side. Disposition Plan:   Status is: Inpatient  Remains inpatient appropriate because:Inpatient level of care appropriate due to severity of illness  Dispo: The patient is from: Home              Anticipated d/c is to:  SNF ( 10 days post COVID dx. for facilities to accept in SNF) 7/14              Patient currently is not medically stable to d/c.   Difficult to place patient No  Consultants:  None  Procedures: MRI /CT Head Antimicrobials:  Anti-infectives (From admission, onward)    Start     Dose/Rate Route Frequency Ordered Stop   03/15/21 1000  remdesivir 100 mg in sodium chloride 0.9 % 100 mL IVPB       See Hyperspace for full Linked Orders Report.   100 mg 200 mL/hr over 30 Minutes Intravenous Daily 03/14/21 1455 03/18/21 1040   03/14/21 1600  remdesivir 200 mg in sodium chloride 0.9% 250 mL IVPB       See Hyperspace for full Linked Orders Report.   200 mg 580 mL/hr over 30 Minutes Intravenous Once 03/14/21 1455 03/14/21 1630       Subjective: Patient was seen and examined at bedside.  Overnight  events noted.  Patient is more alert , awake and oriented.  He is cooperative. He denies any chest pain, shortness of breath, dizziness, palpitations.  Objective: Vitals:   03/19/21 1249 03/19/21 1956 03/20/21 0526 03/20/21 1207  BP: 118/61 115/70 97/78 104/71  Pulse: 90 73 69 79  Resp: 20 20  16   Temp: (!) 97.5 F (36.4 C) 98 F (36.7 C) (!) 97.5 F (36.4 C) (!) 97.3 F (36.3 C)  TempSrc: Oral Oral Oral Oral  SpO2: 97% 99% 97% 96%  Weight:      Height:        Intake/Output Summary (Last 24 hours) at 03/20/2021  1358 Last data filed at 03/20/2021 1336 Gross per 24 hour  Intake 630 ml  Output 500 ml  Net 130 ml   Filed Weights   03/15/21 0117  Weight: 102.9 kg    Examination:  General exam: Appears calm and comfortable , not in any acute distress.  Deconditioned Respiratory system: Clear to auscultation. Respiratory effort normal. Cardiovascular system: S1 & S2 heard, RRR. No JVD, murmurs, rubs, gallops or clicks. No pedal edema. Gastrointestinal system: Abdomen is nondistended, soft and nontender. No organomegaly or masses felt. Normal bowel sounds heard. Central nervous system: Alert and oriented x 2 . No focal neurological deficits. Extremities: Symmetric 5 x 5 power.  No edema, no cyanosis, no clubbing. Skin: No rashes, lesions or ulcers Psychiatry: Judgement and insight appear normal. Mood & affect appropriate.     Data Reviewed: I have personally reviewed following labs and imaging studies  CBC: Recent Labs  Lab 03/15/21 0342 03/16/21 0357 03/17/21 0412 03/18/21 0343 03/19/21 0355  WBC 3.7* 3.0* 3.9* 3.7* 4.2  NEUTROABS 2.5 1.6* 2.5 2.1 2.5  HGB 14.5 14.6 15.0 14.9 14.8  HCT 42.1 42.6 43.4 43.3 43.4  MCV 103.7* 102.2* 102.4* 102.9* 104.1*  PLT 127* 121* 136* 125* 458*   Basic Metabolic Panel: Recent Labs  Lab 03/15/21 0342 03/16/21 0357 03/17/21 0412 03/18/21 0343 03/19/21 0355  NA 133* 136 138 139 141  K 3.7 3.5 3.5 3.5 3.6  CL 94* 98 99 100 101  CO2 28 29 29  33* 32  GLUCOSE 85 87 109* 97 101*  BUN 10 13 16 19 18   CREATININE 0.70 0.56* 0.84 1.06 0.95  CALCIUM 8.4* 8.5* 8.6* 8.5* 8.7*  MG  --   --  2.0  --  2.1  PHOS  --   --  3.6  --  3.9   GFR: Estimated Creatinine Clearance: 81.5 mL/min (by C-G formula based on SCr of 0.95 mg/dL). Liver Function Tests: Recent Labs  Lab 03/15/21 0342 03/16/21 0357 03/17/21 0412 03/18/21 0343 03/19/21 0355  AST 35 29 30 26 19   ALT 19 17 16 17 14   ALKPHOS 31* 32* 33* 36* 39  BILITOT 1.0 0.9 0.9 1.0 1.3*  PROT  5.9* 6.0* 6.0* 5.8* 5.8*  ALBUMIN 3.0* 3.0* 3.2* 3.2* 3.1*   No results for input(s): LIPASE, AMYLASE in the last 168 hours. Recent Labs  Lab 03/14/21 1104  AMMONIA 32   Coagulation Profile: No results for input(s): INR, PROTIME in the last 168 hours.  Cardiac Enzymes: No results for input(s): CKTOTAL, CKMB, CKMBINDEX, TROPONINI in the last 168 hours. BNP (last 3 results) Recent Labs    12/07/20 1223  PROBNP 87.0   HbA1C: No results for input(s): HGBA1C in the last 72 hours.  CBG: Recent Labs  Lab 03/19/21 1245 03/19/21 1622 03/19/21 2054 03/20/21 0727 03/20/21 1151  GLUCAP 114*  110* 94 102* 145*   Lipid Profile: No results for input(s): CHOL, HDL, LDLCALC, TRIG, CHOLHDL, LDLDIRECT in the last 72 hours.  Thyroid Function Tests: No results for input(s): TSH, T4TOTAL, FREET4, T3FREE, THYROIDAB in the last 72 hours. Anemia Panel: No results for input(s): VITAMINB12, FOLATE, FERRITIN, TIBC, IRON, RETICCTPCT in the last 72 hours.  Sepsis Labs: Recent Labs  Lab 03/14/21 1111 03/14/21 1155  PROCALCITON <0.10  --   LATICACIDVEN  --  1.3    Recent Results (from the past 240 hour(s))  Blood Culture (routine x 2)     Status: None   Collection Time: 03/12/21  7:35 PM   Specimen: BLOOD  Result Value Ref Range Status   Specimen Description   Final    BLOOD LEFT ANTECUBITAL Performed at [facility] [address] [place], [place] [address]    Special Requests   Final    BOTTLES DRAWN AEROBIC AND ANAEROBIC Blood Culture results may not be optimal due to an excessive volume of blood received in culture bottles Performed at [facility] [address], [place], [place] [address]    Culture   Final    NO GROWTH 5 DAYS Performed at [facility], [place] [address], [place], [place] [address]    Report Status 03/18/2021 FINAL  Final  Blood Culture (routine x 2)     Status: None   Collection Time: 03/12/21  7:38 PM    Specimen: BLOOD  Result Value Ref Range Status   Specimen Description   Final    BLOOD RIGHT ANTECUBITAL Performed at [facility] [address], [place], [place] [address]    Special Requests   Final    BOTTLES DRAWN AEROBIC AND ANAEROBIC Blood Culture adequate volume Performed at [facility] [address], [place], [place] [address]    Culture   Final    NO GROWTH 5 DAYS Performed at [facility], [place] [address], [place],  [address]    Report Status 03/18/2021 FINAL  Final  Resp Panel by RT-PCR (Flu A&B, Covid) Nasopharyngeal Swab     Status: Abnormal   Collection Time: 03/12/21  7:46 PM   Specimen: Nasopharyngeal Swab; Nasopharyngeal(NP) swabs in vial transport medium  Result Value Ref Range Status   SARS Coronavirus 2 by RT PCR POSITIVE (A) NEGATIVE Final    Comment: RESULT CALLED TO, READ BACK BY AND VERIFIED WITH: REBECCA GOODWIN AT 2053 ON 03/12/21 BY MAJ (NOTE) SARS-CoV-2 target nucleic acids are DETECTED.  The SARS-CoV-2 RNA is generally detectable in upper respiratory specimens during the acute phase of infection. Positive results are indicative of the presence of the identified virus, but do not rule out bacterial infection or co-infection with other pathogens not detected by the test. Clinical correlation with patient history and other diagnostic information is necessary to determine patient infection status. The expected result is Negative.  Fact Sheet for Patients: EntrepreneurPulse.com.au  Fact Sheet for Healthcare Providers: IncredibleEmployment.be  This test is not yet approved or cleared by the [place] FDA and  has been authorized for detection and/or diagnosis of SARS-CoV-2 by FDA under an Emergency Use Authorization (EUA).  This EUA will remain in effect (meaning this te st can be used) for the duration of  the COVID-19 declaration under Section 564(b)(1) of  the Act, 21 U.S.C. section 360bbb-3(b)(1), unless the authorization is terminated or revoked sooner.     Influenza A by PCR NEGATIVE NEGATIVE  Final   Influenza B by PCR NEGATIVE NEGATIVE Final    Comment: (NOTE) The Xpert Xpress SARS-CoV-2/FLU/RSV plus assay is intended as an aid in the diagnosis of influenza from Nasopharyngeal swab specimens and should not be used as a sole basis for treatment. Nasal washings and aspirates are unacceptable for Xpert Xpress SARS-CoV-2/FLU/RSV testing.  Fact Sheet for Patients: EntrepreneurPulse.com.au  Fact Sheet for Healthcare Providers: IncredibleEmployment.be  This test is not yet approved or cleared by the [place] FDA and has been authorized for detection and/or diagnosis of SARS-CoV-2 by FDA under an Emergency Use Authorization (EUA). This EUA will remain in effect (meaning this test can be used) for the duration of the COVID-19 declaration under Section 564(b)(1) of the Act, 21 U.S.C. section 360bbb-3(b)(1), unless the authorization is terminated or revoked.  Performed at [facility], [place] [address], [place], [place] [address]   Urine culture     Status: None   Collection Time: 03/12/21  8:09 PM   Specimen: In/Out Cath Urine  Result Value Ref Range Status   Specimen Description   Final    IN/OUT CATH URINE Performed at [facility] [address] [place], [place] [address]    Special Requests   Final    NONE Performed at [facility], [place] [address], [place], [place] [address]    Culture   Final    NO GROWTH Performed at [facility], [place] [address] [place], [place] [address]    Report Status 03/14/2021 FINAL  Final  Blood Culture (routine x 2)     Status: None   Collection Time: 03/14/21 11:11 AM   Specimen: BLOOD  Result Value Ref Range Status   Specimen Description   Final    BLOOD LEFT ANTECUBITAL Performed at  [facility] [address], [place], [place] [address]    Special Requests   Final    BOTTLES DRAWN AEROBIC AND ANAEROBIC Blood Culture results may not be optimal due to an inadequate volume of blood received in culture bottles Performed at [facility] [address] [place], [place] [address]    Culture   Final    NO GROWTH 5 DAYS Performed at [facility], [place] [address], [place],  [address]    Report Status 03/19/2021 FINAL  Final    Radiology Studies: No results found.  Scheduled Meds:  apixaban  5 mg Oral BID   atorvastatin  20 mg Oral Daily   azaTHIOprine  200 mg Oral Daily   divalproex  750 mg Oral Q12H   DULoxetine  60 mg Oral Daily   furosemide  40 mg Oral Once per day on Sun Thu   insulin aspart  0-15 Units Subcutaneous TID WC   insulin aspart  0-5 Units Subcutaneous QHS   levothyroxine  50 mcg Oral Q0600   mouth rinse  15 mL Mouth Rinse BID   melatonin  10 mg Oral QHS   metoprolol tartrate  12.5 mg Oral BID   tamsulosin  0.4 mg Oral Daily   Continuous Infusions:     LOS: 6 days    Time spent: 25 mins  Shawna Clamp, MD [facility]   If 7PM-7AM, please contact night-coverage

## 2021-03-20 NOTE — Care Management Important Message (Signed)
Important Message  Patient Details IM Letter given to the Patient. Name: Kalif Kattner [facility] Sr. MRN: [id number] Date of Birth: [birth year]   Medicare Important Message Given:  Yes     Kerin Salen 03/20/2021, 9:20 AM

## 2021-03-21 DIAGNOSIS — G9340 Encephalopathy, unspecified: Secondary | ICD-10-CM | POA: Diagnosis not present

## 2021-03-21 LAB — CBC
HCT: 44.2 % (ref 39.0–52.0)
Hemoglobin: 15.1 g/dL (ref 13.0–17.0)
MCH: 35 pg — ABNORMAL HIGH (ref 26.0–34.0)
MCHC: 34.2 g/dL (ref 30.0–36.0)
MCV: 102.6 fL — ABNORMAL HIGH (ref 80.0–100.0)
Platelets: 155 10*3/uL (ref 150–400)
RBC: 4.31 MIL/uL (ref 4.22–5.81)
RDW: 14.9 % (ref 11.5–15.5)
WBC: 5.6 10*3/uL (ref 4.0–10.5)
nRBC: 0 % (ref 0.0–0.2)

## 2021-03-21 LAB — COMPREHENSIVE METABOLIC PANEL
ALT: 12 U/L (ref 0–44)
AST: 20 U/L (ref 15–41)
Albumin: 3.1 g/dL — ABNORMAL LOW (ref 3.5–5.0)
Alkaline Phosphatase: 42 U/L (ref 38–126)
Anion gap: 9 (ref 5–15)
BUN: 17 mg/dL (ref 8–23)
CO2: 28 mmol/L (ref 22–32)
Calcium: 8.8 mg/dL — ABNORMAL LOW (ref 8.9–10.3)
Chloride: 101 mmol/L (ref 98–111)
Creatinine, Ser: 0.78 mg/dL (ref 0.61–1.24)
GFR, Estimated: >60 mL/min (ref >60)
Glucose, Bld: 132 mg/dL — ABNORMAL HIGH (ref 70–99)
Potassium: 3.2 mmol/L — ABNORMAL LOW (ref 3.5–5.1)
Sodium: 138 mmol/L (ref 135–145)
Total Bilirubin: 1.5 mg/dL — ABNORMAL HIGH (ref 0.3–1.2)
Total Protein: 5.8 g/dL — ABNORMAL LOW (ref 6.5–8.1)

## 2021-03-21 LAB — GLUCOSE, CAPILLARY
Glucose-Capillary: 100 mg/dL — ABNORMAL HIGH (ref 70–99)
Glucose-Capillary: 126 mg/dL — ABNORMAL HIGH (ref 70–99)
Glucose-Capillary: 104 mg/dL — ABNORMAL HIGH (ref 70–99)
Glucose-Capillary: 102 mg/dL — ABNORMAL HIGH (ref 70–99)

## 2021-03-21 LAB — PHOSPHORUS: Phosphorus: 3.6 mg/dL (ref 2.5–4.6)

## 2021-03-21 LAB — MAGNESIUM: Magnesium: 2 mg/dL (ref 1.7–2.4)

## 2021-03-21 MED ORDER — POTASSIUM CHLORIDE 20 MEQ PO PACK
40.0000 meq | PACK | Freq: Once | ORAL | Status: AC
  Administered 2021-03-21: 40 meq via ORAL
  Filled 2021-03-21: qty 2

## 2021-03-21 NOTE — Progress Notes (Signed)
PROGRESS NOTE    [name] Sr.  ERX:[id number] DOB: [birth year] DOA: 03/14/2021 PCP: [name], DO   Brief Narrative:  This 70 years old male with PMH significant for dementia, atrial fibrillation, ulcerative colitis, depression brought in the ED after he was found increasingly confused at home.  Patient was not communicating and not himself for the last 3 days. Patient has come to the ER 3 days ago with confusion and weakness and at that time he was diagnosed with COVID and he was discharged home on oral antiviral medication. He has not been communicating and not following commands.  Due to worsening weakness and encephalopathy patient was brought back in the ED.  CT head is unremarkable.  MRI head is unremarkable, No acute intracranial abnormality.  ABG does not show hypercarbia.  Patient is admitted for possible encephalopathy due to [place].  Encephalopathy has resolved.  Assessment & Plan:   Principal Problem:   Acute encephalopathy Active Problems:   Dementia (HCC)   Hypothyroidism   Chronic ulcerative pancolitis (HCC)   Chronic diastolic CHF (congestive heart failure) (HCC)   Paroxysmal atrial fibrillation (HCC)   Weakness  Acute encephalopathy in the setting of COVID infection: Patient was brought in the ED 3 days ago,  found to have COVID and was discharged on antiviral medication. Its most likely related to COVID, patient symptoms has not improved in last 3 days. MRI: No acute intracranial abnormality.  ABGs no hypercarbia. EEG no evidence of seizures. UA unremarkable, chest x-ray unremarkable.  Blood cultures no growth so far. Bedside swallow evaluation recommended dysphagia 2 thin liquids. Patient improved in his mental status.  Appears at his baseline.  COVID infection: Patient is not hypoxic at this time,  Closely monitor for worsening respiratory symptoms.   Completed  IV remdesivir ( day 4/4).  Hypothyroidism :  Continue levothyroxine.  History of A. Fib  : Continue Eliquis. Continue metoprolol 12.5 mg twice daily. HR controlled.  History of ulcerative colitis : continue Imuran  History of depression: Continue Depakote, Zyprexa was discontinued since patient was more lethargic in am.  He is more alert and cooperative.  DVT prophylaxis: Heparin Code Status: DNR Family Communication: No family at bed side. Disposition Plan:   Status is: Inpatient  Remains inpatient appropriate because:Inpatient level of care appropriate due to severity of illness  Dispo: The patient is from: Home              Anticipated d/c is to:  SNF (10 days post COVID dx. for facilities to accept in SNF) 7/14              Patient currently is not medically stable to d/c.   Difficult to place patient No  Consultants:  None  Procedures: MRI /CT Head Antimicrobials:  Anti-infectives (From admission, onward)    Start     Dose/Rate Route Frequency Ordered Stop   03/15/21 1000  remdesivir 100 mg in sodium chloride 0.9 % 100 mL IVPB       See Hyperspace for full Linked Orders Report.   100 mg 200 mL/hr over 30 Minutes Intravenous Daily 03/14/21 1455 03/18/21 1040   03/14/21 1600  remdesivir 200 mg in sodium chloride 0.9% 250 mL IVPB       See Hyperspace for full Linked Orders Report.   200 mg 580 mL/hr over 30 Minutes Intravenous Once 03/14/21 1455 03/14/21 1630       Subjective: Patient was seen and examined at bedside.  Overnight events  noted.  Patient is more alert , awake and oriented x 2.  He is cooperative. He denies any chest pain, shortness of breath, dizziness, palpitations.  Objective: Vitals:   03/20/21 1207 03/20/21 2015 03/21/21 0516 03/21/21 1214  BP: 104/71 129/65 107/71 105/67  Pulse: 79 64 89 70  Resp: 16 18 16 16   Temp: (!) 97.3 F (36.3 C) 97.9 F (36.6 C) (!) 97.5 F (36.4 C) 97.8 F (36.6 C)  TempSrc: Oral Axillary Oral Oral  SpO2: 96% 98% 96% 95%  Weight:      Height:        Intake/Output Summary (Last 24 hours) at  03/21/2021 1540 Last data filed at 03/21/2021 1300 Gross per 24 hour  Intake 780 ml  Output 250 ml  Net 530 ml   Filed Weights   03/15/21 0117  Weight: 102.9 kg    Examination:  General exam: Appears calm and comfortable , not in any acute distress.  Deconditioned Respiratory system: Clear to auscultation. Respiratory effort normal. Cardiovascular system: S1 & S2 heard, RRR. No JVD, murmurs, rubs, gallops or clicks. No pedal edema. Gastrointestinal system: Abdomen is nondistended, soft and nontender. No organomegaly or masses felt. Normal bowel sounds heard. Central nervous system: Alert and oriented x 2 . No focal neurological deficits. Extremities: Symmetric 5 x 5 power.  No edema, no cyanosis, no clubbing. Skin: No rashes, lesions or ulcers Psychiatry: Judgement and insight appear normal. Mood & affect appropriate.     Data Reviewed: I have personally reviewed following labs and imaging studies  CBC: Recent Labs  Lab 03/15/21 0342 03/16/21 0357 03/17/21 0412 03/18/21 0343 03/19/21 0355 03/21/21 0357  WBC 3.7* 3.0* 3.9* 3.7* 4.2 5.6  NEUTROABS 2.5 1.6* 2.5 2.1 2.5  --   HGB 14.5 14.6 15.0 14.9 14.8 15.1  HCT 42.1 42.6 43.4 43.3 43.4 44.2  MCV 103.7* 102.2* 102.4* 102.9* 104.1* 102.6*  PLT 127* 121* 136* 125* 135* 741   Basic Metabolic Panel: Recent Labs  Lab 03/16/21 0357 03/17/21 0412 03/18/21 0343 03/19/21 0355 03/21/21 0357  NA 136 138 139 141 138  K 3.5 3.5 3.5 3.6 3.2*  CL 98 99 100 101 101  CO2 29 29 33* 32 28  GLUCOSE 87 109* 97 101* 132*  BUN 13 16 19 18 17   CREATININE 0.56* 0.84 1.06 0.95 0.78  CALCIUM 8.5* 8.6* 8.5* 8.7* 8.8*  MG  --  2.0  --  2.1 2.0  PHOS  --  3.6  --  3.9 3.6   GFR: Estimated Creatinine Clearance: 96.8 mL/min (by C-G formula based on SCr of 0.78 mg/dL). Liver Function Tests: Recent Labs  Lab 03/16/21 0357 03/17/21 0412 03/18/21 0343 03/19/21 0355 03/21/21 0357  AST 29 30 26 19 20   ALT 17 16 17 14 12   ALKPHOS 32*  33* 36* 39 42  BILITOT 0.9 0.9 1.0 1.3* 1.5*  PROT 6.0* 6.0* 5.8* 5.8* 5.8*  ALBUMIN 3.0* 3.2* 3.2* 3.1* 3.1*   No results for input(s): LIPASE, AMYLASE in the last 168 hours. No results for input(s): AMMONIA in the last 168 hours.  Coagulation Profile: No results for input(s): INR, PROTIME in the last 168 hours.  Cardiac Enzymes: No results for input(s): CKTOTAL, CKMB, CKMBINDEX, TROPONINI in the last 168 hours. BNP (last 3 results) Recent Labs    12/07/20 1223  PROBNP 87.0   HbA1C: No results for input(s): HGBA1C in the last 72 hours.  CBG: Recent Labs  Lab 03/20/21 1151 03/20/21 1652 03/20/21 2219 03/21/21  0730 03/21/21 1154  GLUCAP 145* 90 97 100* 126*   Lipid Profile: No results for input(s): CHOL, HDL, LDLCALC, TRIG, CHOLHDL, LDLDIRECT in the last 72 hours.  Thyroid Function Tests: No results for input(s): TSH, T4TOTAL, FREET4, T3FREE, THYROIDAB in the last 72 hours. Anemia Panel: No results for input(s): VITAMINB12, FOLATE, FERRITIN, TIBC, IRON, RETICCTPCT in the last 72 hours.  Sepsis Labs: No results for input(s): PROCALCITON, LATICACIDVEN in the last 168 hours.   Recent Results (from the past 240 hour(s))  Blood Culture (routine x 2)     Status: None   Collection Time: 03/12/21  7:35 PM   Specimen: BLOOD  Result Value Ref Range Status   Specimen Description   Final    BLOOD LEFT ANTECUBITAL Performed at [facility] [address], [place], [place] [address]    Special Requests   Final    BOTTLES DRAWN AEROBIC AND ANAEROBIC Blood Culture results may not be optimal due to an excessive volume of blood received in culture bottles Performed at [facility] [address], [place], [place] [address]    Culture   Final    NO GROWTH 5 DAYS Performed at [facility], [place] [address] [place], [place] [address]    Report Status 03/18/2021 FINAL  Final  Blood Culture (routine x 2)     Status: None   Collection  Time: 03/12/21  7:38 PM   Specimen: BLOOD  Result Value Ref Range Status   Specimen Description   Final    BLOOD RIGHT ANTECUBITAL Performed at [facility] [address] [place], [place] [address]    Special Requests   Final    BOTTLES DRAWN AEROBIC AND ANAEROBIC Blood Culture adequate volume Performed at [facility] [address] [place], [place] [address]    Culture   Final    NO GROWTH 5 DAYS Performed at [facility], [place] [address], [place], [place] [address]    Report Status 03/18/2021 FINAL  Final  Resp Panel by RT-PCR (Flu A&B, Covid) Nasopharyngeal Swab     Status: Abnormal   Collection Time: 03/12/21  7:46 PM   Specimen: Nasopharyngeal Swab; Nasopharyngeal(NP) swabs in vial transport medium  Result Value Ref Range Status   SARS Coronavirus 2 by RT PCR POSITIVE (A) NEGATIVE Final    Comment: RESULT CALLED TO, READ BACK BY AND VERIFIED WITH: REBECCA GOODWIN AT 2053 ON 03/12/21 BY MAJ (NOTE) SARS-CoV-2 target nucleic acids are DETECTED.  The SARS-CoV-2 RNA is generally detectable in upper respiratory specimens during the acute phase of infection. Positive results are indicative of the presence of the identified virus, but do not rule out bacterial infection or co-infection with other pathogens not detected by the test. Clinical correlation with patient history and other diagnostic information is necessary to determine patient infection status. The expected result is Negative.  Fact Sheet for Patients: EntrepreneurPulse.com.au  Fact Sheet for Healthcare Providers: IncredibleEmployment.be  This test is not yet approved or cleared by the [place] FDA and  has been authorized for detection and/or diagnosis of SARS-CoV-2 by FDA under an Emergency Use Authorization (EUA).  This EUA will remain in effect (meaning this te st can be used) for the duration of  the COVID-19 declaration  under Section 564(b)(1) of the Act, 21 U.S.C. section 360bbb-3(b)(1), unless the authorization is terminated or revoked sooner.     Influenza A by PCR NEGATIVE NEGATIVE Final   Influenza  B by PCR NEGATIVE NEGATIVE Final    Comment: (NOTE) The Xpert Xpress SARS-CoV-2/FLU/RSV plus assay is intended as an aid in the diagnosis of influenza from Nasopharyngeal swab specimens and should not be used as a sole basis for treatment. Nasal washings and aspirates are unacceptable for Xpert Xpress SARS-CoV-2/FLU/RSV testing.  Fact Sheet for Patients: EntrepreneurPulse.com.au  Fact Sheet for Healthcare Providers: IncredibleEmployment.be  This test is not yet approved or cleared by the [place] FDA and has been authorized for detection and/or diagnosis of SARS-CoV-2 by FDA under an Emergency Use Authorization (EUA). This EUA will remain in effect (meaning this test can be used) for the duration of the COVID-19 declaration under Section 564(b)(1) of the Act, 21 U.S.C. section 360bbb-3(b)(1), unless the authorization is terminated or revoked.  Performed at [facility], [place] [address] [place], [place] [address]   Urine culture     Status: None   Collection Time: 03/12/21  8:09 PM   Specimen: In/Out Cath Urine  Result Value Ref Range Status   Specimen Description   Final    IN/OUT CATH URINE Performed at [facility] [address] [place], [place] [address]    Special Requests   Final    NONE Performed at [facility], [place] [address] [place], [place] [address]    Culture   Final    NO GROWTH Performed at [facility], [place] [address] [place], [place] [address]    Report Status 03/14/2021 FINAL  Final  Blood Culture (routine x 2)     Status: None   Collection Time: 03/14/21 11:11 AM   Specimen: BLOOD  Result Value Ref Range Status   Specimen Description   Final    BLOOD LEFT  ANTECUBITAL Performed at [facility] [address], [place], [place] [address]    Special Requests   Final    BOTTLES DRAWN AEROBIC AND ANAEROBIC Blood Culture results may not be optimal due to an inadequate volume of blood received in culture bottles Performed at [facility] [address], [place], [place] [address]    Culture   Final    NO GROWTH 5 DAYS Performed at [facility], [place] [address] [place], [place] [address]    Report Status 03/19/2021 FINAL  Final    Radiology Studies: No results found.  Scheduled Meds:  apixaban  5 mg Oral BID   atorvastatin  20 mg Oral Daily   azaTHIOprine  200 mg Oral Daily   divalproex  750 mg Oral Q12H   DULoxetine  60 mg Oral Daily   furosemide  40 mg Oral Once per day on Sun Thu   insulin aspart  0-15 Units Subcutaneous TID WC   insulin aspart  0-5 Units Subcutaneous QHS   levothyroxine  50 mcg Oral Q0600   mouth rinse  15 mL Mouth Rinse BID   melatonin  10 mg Oral QHS   metoprolol tartrate  12.5 mg Oral BID   tamsulosin  0.4 mg Oral Daily   Continuous Infusions:     LOS: 7 days    Time spent: 25 mins  Shawna Clamp, MD [facility]   If 7PM-7AM, please contact night-coverage

## 2021-03-21 NOTE — Plan of Care (Signed)
  Problem: Clinical Measurements: Goal: Respiratory complications will improve Outcome: Progressing   

## 2021-03-22 DIAGNOSIS — G9340 Encephalopathy, unspecified: Secondary | ICD-10-CM | POA: Diagnosis not present

## 2021-03-22 LAB — GLUCOSE, CAPILLARY
Glucose-Capillary: 104 mg/dL — ABNORMAL HIGH (ref 70–99)
Glucose-Capillary: 111 mg/dL — ABNORMAL HIGH (ref 70–99)
Glucose-Capillary: 93 mg/dL (ref 70–99)
Glucose-Capillary: 133 mg/dL — ABNORMAL HIGH (ref 70–99)
Glucose-Capillary: 109 mg/dL — ABNORMAL HIGH (ref 70–99)

## 2021-03-22 LAB — BASIC METABOLIC PANEL
Anion gap: 8 (ref 5–15)
BUN: 14 mg/dL (ref 8–23)
CO2: 29 mmol/L (ref 22–32)
Calcium: 8.9 mg/dL (ref 8.9–10.3)
Chloride: 104 mmol/L (ref 98–111)
Creatinine, Ser: 0.81 mg/dL (ref 0.61–1.24)
GFR, Estimated: >60 mL/min (ref >60)
Glucose, Bld: 100 mg/dL — ABNORMAL HIGH (ref 70–99)
Potassium: 3.7 mmol/L (ref 3.5–5.1)
Sodium: 141 mmol/L (ref 135–145)

## 2021-03-22 LAB — CBC
HCT: 43.7 % (ref 39.0–52.0)
Hemoglobin: 14.9 g/dL (ref 13.0–17.0)
MCH: 35.4 pg — ABNORMAL HIGH (ref 26.0–34.0)
MCHC: 34.1 g/dL (ref 30.0–36.0)
MCV: 103.8 fL — ABNORMAL HIGH (ref 80.0–100.0)
Platelets: 156 10*3/uL (ref 150–400)
RBC: 4.21 MIL/uL — ABNORMAL LOW (ref 4.22–5.81)
RDW: 15.1 % (ref 11.5–15.5)
WBC: 5 10*3/uL (ref 4.0–10.5)
nRBC: 0 % (ref 0.0–0.2)

## 2021-03-22 LAB — MAGNESIUM: Magnesium: 2 mg/dL (ref 1.7–2.4)

## 2021-03-22 LAB — PHOSPHORUS: Phosphorus: 3.7 mg/dL (ref 2.5–4.6)

## 2021-03-22 MED ORDER — DOCUSATE SODIUM 100 MG PO CAPS
100.0000 mg | ORAL_CAPSULE | Freq: Two times a day (BID) | ORAL | Status: DC
  Administered 2021-03-23: 100 mg via ORAL
  Filled 2021-03-22: qty 1

## 2021-03-22 MED ORDER — POLYETHYLENE GLYCOL 3350 17 G PO PACK
17.0000 g | PACK | Freq: Every day | ORAL | Status: DC
  Administered 2021-03-23: 17 g via ORAL
  Filled 2021-03-22: qty 1

## 2021-03-22 MED ORDER — LEVOTHYROXINE SODIUM 50 MCG PO TABS: 50.0000 ug | ORAL_TABLET | Freq: Every day | ORAL | Status: DC

## 2021-03-22 MED ORDER — OLANZAPINE 5 MG PO TABS
5.0000 mg | ORAL_TABLET | Freq: Every day | ORAL | Status: DC
  Administered 2021-03-22 – 2021-03-23 (×2): 5 mg via ORAL
  Filled 2021-03-22 (×2): qty 1

## 2021-03-22 MED ORDER — TRAZODONE HCL 50 MG PO TABS
50.0000 mg | ORAL_TABLET | Freq: Every day | ORAL | Status: DC
  Filled 2021-03-22: qty 1

## 2021-03-22 NOTE — Progress Notes (Addendum)
Physical Therapy Treatment Patient Details Name: [name] HERMANN Sr. MRN: [id number] DOB: [birth year] Today's Date: 03/22/2021    History of Present Illness 70 years old male brought in the ED after he was found increasingly confused at home and admitted for acute encephalopathy in setting of Covid-19 infection.   PMH significant for dementia, atrial fibrillation, ulcerative colitis, depression    PT Comments    Progressing with mobility. Pt participated well. Session was limited by bowel incontinence. Will continue to follow and progress activity as tolerated. Recommend SNF.    Follow Up Recommendations  SNF     Equipment Recommendations  None recommended by PT    Recommendations for Other Services       Precautions / Restrictions Precautions Precautions: Fall Precaution Comments: currently in mittens; Airborne/Contact Restrictions Weight Bearing Restrictions: No    Mobility  Bed Mobility Overal bed mobility: Needs Assistance Bed Mobility: Supine to Sit;Sit to Supine     Supine to sit: Mod assist Sit to supine: Mod assist   General bed mobility comments: Mod A for trunk and LEs. Increased time.    Transfers Overall transfer level: Needs assistance Equipment used: Rolling walker (2 wheeled) Transfers: Sit to/from Stand Sit to Stand: Min assist;From elevated surface         General transfer comment: Assist to power up, stabilize, control descent. Multimodal cueing required. Increased time.  Ambulation/Gait Ambulation/Gait assistance: Min assist   Assistive device: Rolling walker (2 wheeled)       General Gait Details: Side steps along side of bed with RW. Assist to stabilize pt and manage RW. Pt tolerated well. Unable to attempt further ambulation 2* bowel incontinence   Stairs             Wheelchair Mobility    Modified Rankin (Stroke Patients Only)       Balance Overall balance assessment: Needs assistance         Standing balance  support: Bilateral upper extremity supported Standing balance-Leahy Scale: Poor                              Cognition Arousal/Alertness: Awake/alert Behavior During Therapy: Flat affect Overall Cognitive Status: No family/caregiver present to determine baseline cognitive functioning Area of Impairment: Orientation;Following commands;Safety/judgement;Awareness;Problem solving;Memory                 Orientation Level: Disoriented to;Place;Time;Situation   Memory: Decreased short-term memory;Decreased recall of precautions Following Commands: Follows one step commands inconsistently Safety/Judgement: Decreased awareness of safety;Decreased awareness of deficits   Problem Solving: Slow processing;Decreased initiation;Requires verbal cues;Requires tactile cues;Difficulty sequencing General Comments: hx of dementia, inconsistent ability to follow 1-step commands with need of increased time for processing.  Very impaired memory.      Exercises      General Comments        Pertinent Vitals/Pain Pain Assessment: Faces Faces Pain Scale: No hurt Pain Intervention(s): Monitored during session;Repositioned    Home Living                      Prior Function            PT Goals (current goals can now be found in the care plan section) Progress towards PT goals: Progressing toward goals    Frequency    Min 2X/week      PT Plan Current plan remains appropriate    Co-evaluation  AM-PAC PT "6 Clicks" Mobility   Outcome Measure  Help needed turning from your back to your side while in a flat bed without using bedrails?: A Lot Help needed moving from lying on your back to sitting on the side of a flat bed without using bedrails?: A Lot Help needed moving to and from a bed to a chair (including a wheelchair)?: A Lot Help needed standing up from a chair using your arms (e.g., wheelchair or bedside chair)?: A Lot Help needed to walk  in hospital room?: A Lot Help needed climbing 3-5 steps with a railing? : Total 6 Click Score: 11    End of Session Equipment Utilized During Treatment: Gait belt Activity Tolerance: Patient tolerated treatment well Patient left: in bed;with call bell/phone within reach;with bed alarm set Made RN aware that pt needed assistance with getting cleaned up PT Visit Diagnosis: Muscle weakness (generalized) (M62.81);Difficulty in walking, not elsewhere classified (R26.2)     Time: 3953-2023 PT Time Calculation (min) (ACUTE ONLY): 10 min  Charges:  $Gait Training: 8-22 mins                         Doreatha Massed, PT Acute Rehabilitation  Office: [phone] Pager: [phone]

## 2021-03-22 NOTE — TOC Progression Note (Addendum)
Transition of Care (TOC) - Progression Note    Patient Details  Name: HOLLY PRING Sr. MRN: [id number] Date of Birth: 1951/01/24  Transition of Care [facility]) CM/SW Contact  Ross Ludwig, [place] Phone Number: 03/22/2021, 10:00 AM  Clinical Narrative:     CSW attempted to contact Estill Bamberg in admission, CSW left a message awaiting for a call back.  1:00pm CSW spoke to [place] in admissions she can accept patient pending insurance authorization and family agreeing to pay copays.  Estill Bamberg to start insurance authorization through [place].  4:45pm  CSW was informed that patient has been approved for SNF at [facility], and patient can come tomorrow.  Patient's wife will have to pay copay, Estill Bamberg left message with wife to confirm copays can be paid.  CSW to follow up with SNF and wife in the morning.  Expected Discharge Plan: [place] Barriers to Discharge: Continued Medical Work up  Expected Discharge Plan and Services Expected Discharge Plan: [place] In-house Referral: Clinical Social Work   Post Acute Care Choice: [place] Living arrangements for the past 2 months: Single Family Home                                       Social Determinants of Health (SDOH) Interventions    Readmission Risk Interventions Readmission Risk Prevention Plan 02/18/2019  Transportation Screening Complete  PCP or Specialist Appt within 3-5 Days Complete  HRI or [place] Complete  Social Work Consult for [place] Planning/Counseling Complete  Palliative Care Screening Not Applicable  Medication Review Press photographer) Complete  Some recent data might be hidden

## 2021-03-22 NOTE — Progress Notes (Signed)
PROGRESS NOTE    [name] BRAGER Sr.  HKF:[id number] DOB: 02-08-1951 DOA: 03/14/2021 PCP: [name], DO   Brief Narrative:  This 70 years old male with PMH significant for dementia, atrial fibrillation, ulcerative colitis, depression brought in the ED after he was found increasingly confused at home.  Patient was not communicating and not himself for the last 3 days. Patient has come to the ER 3 days ago with confusion and weakness and at that time he was diagnosed with COVID and he was discharged home on oral antiviral medication. He has not been communicating and not following commands.  Due to worsening weakness and encephalopathy patient was brought back in the ED.  CT head is unremarkable.  MRI head is unremarkable, No acute intracranial abnormality.  ABG does not show hypercarbia.  Patient is admitted for possible encephalopathy due to [place].  Encephalopathy has resolved.  Assessment & Plan:   Principal Problem:   Acute encephalopathy Active Problems:   Dementia (HCC)   Hypothyroidism   Chronic ulcerative pancolitis (HCC)   Chronic diastolic CHF (congestive heart failure) (HCC)   Paroxysmal atrial fibrillation (HCC)   Weakness  Acute encephalopathy in the setting of COVID infection: Patient was brought to the ED 3 days ago, found to have COVID and was discharged on antiviral medication. MRI: No acute intracranial abnormality.  ABGs no hypercarbia. EEG no evidence of seizures. UA unremarkable, chest x-ray unremarkable.  Blood cultures no growth so far. Bedside swallow evaluation recommended dysphagia 2 thin liquids. Patient's encephalopathy was likely secondary to COVID infection.  His mental status has improved.  He is typically oriented x1 or sometimes 2 due to his advanced dementia, verified by wife.  COVID infection: Patient is not hypoxic at this time, has no other symptoms. Closely monitor for worsening respiratory symptoms.   Completed IV remdesivir   Hypothyroidism :   Continue levothyroxine.  History of A. Fib : Continue Eliquis. Continue metoprolol 12.5 mg twice daily. HR controlled.  History of ulcerative colitis : continue Imuran  History of depression: Continue Depakote and resume Zyprexa and trazodone.   DVT prophylaxis: Heparin Code Status: DNR Family Communication: No family at bed side.  Discussed with his wife over the phone. Disposition Plan:   Status is: Inpatient  Remains inpatient appropriate because:Inpatient level of care appropriate due to severity of illness  Dispo: The patient is from: Home              Anticipated d/c is to:  SNF (10 days post COVID dx. for facilities to accept in SNF) 7/14              Patient currently is medically stable to d/c.   Difficult to place patient No  Consultants:  None  Procedures: MRI /CT Head Antimicrobials:  Anti-infectives (From admission, onward)    Start     Dose/Rate Route Frequency Ordered Stop   03/15/21 1000  remdesivir 100 mg in sodium chloride 0.9 % 100 mL IVPB       See Hyperspace for full Linked Orders Report.   100 mg 200 mL/hr over 30 Minutes Intravenous Daily 03/14/21 1455 03/18/21 1040   03/14/21 1600  remdesivir 200 mg in sodium chloride 0.9% 250 mL IVPB       See Hyperspace for full Linked Orders Report.   200 mg 580 mL/hr over 30 Minutes Intravenous Once 03/14/21 1455 03/14/21 1630       Subjective: Patient seen and examined.  He was alert and oriented  to self only.  Had no complaints.  Objective: Vitals:   03/21/21 0516 03/21/21 1214 03/21/21 2031 03/22/21 0341  BP: 107/71 105/67 106/67 105/70  Pulse: 89 70 84 95  Resp: 16 16 16 16   Temp: (!) 97.5 F (36.4 C) 97.8 F (36.6 C) 98.2 F (36.8 C) 98.3 F (36.8 C)  TempSrc: Oral Oral    SpO2: 96% 95% 96% 98%  Weight:      Height:        Intake/Output Summary (Last 24 hours) at 03/22/2021 1336 Last data filed at 03/22/2021 0915 Gross per 24 hour  Intake 410 ml  Output 450 ml  Net -40 ml     Filed Weights   03/15/21 0117  Weight: 102.9 kg    Examination:  General exam: Appears calm and comfortable  Respiratory system: Clear to auscultation. Respiratory effort normal. Cardiovascular system: S1 & S2 heard, RRR. No JVD, murmurs, rubs, gallops or clicks. No pedal edema. Gastrointestinal system: Abdomen is nondistended, soft and nontender. No organomegaly or masses felt. Normal bowel sounds heard. Central nervous system: Alert and oriented x1. No focal neurological deficits. Extremities: Symmetric 5 x 5 power. Skin: No rashes, lesions or ulcers.  Psychiatry: Judgement and insight appear poor.   Data Reviewed: I have personally reviewed following labs and imaging studies  CBC: Recent Labs  Lab 03/16/21 0357 03/17/21 0412 03/18/21 0343 03/19/21 0355 03/21/21 0357 03/22/21 0421  WBC 3.0* 3.9* 3.7* 4.2 5.6 5.0  NEUTROABS 1.6* 2.5 2.1 2.5  --   --   HGB 14.6 15.0 14.9 14.8 15.1 14.9  HCT 42.6 43.4 43.3 43.4 44.2 43.7  MCV 102.2* 102.4* 102.9* 104.1* 102.6* 103.8*  PLT 121* 136* 125* 135* 155 149    Basic Metabolic Panel: Recent Labs  Lab 03/17/21 0412 03/18/21 0343 03/19/21 0355 03/21/21 0357 03/22/21 0421  NA 138 139 141 138 141  K 3.5 3.5 3.6 3.2* 3.7  CL 99 100 101 101 104  CO2 29 33* 32 28 29  GLUCOSE 109* 97 101* 132* 100*  BUN 16 19 18 17 14   CREATININE 0.84 1.06 0.95 0.78 0.81  CALCIUM 8.6* 8.5* 8.7* 8.8* 8.9  MG 2.0  --  2.1 2.0 2.0  PHOS 3.6  --  3.9 3.6 3.7    GFR: Estimated Creatinine Clearance: 95.6 mL/min (by C-G formula based on SCr of 0.81 mg/dL). Liver Function Tests: Recent Labs  Lab 03/16/21 0357 03/17/21 0412 03/18/21 0343 03/19/21 0355 03/21/21 0357  AST 29 30 26 19 20   ALT 17 16 17 14 12   ALKPHOS 32* 33* 36* 39 42  BILITOT 0.9 0.9 1.0 1.3* 1.5*  PROT 6.0* 6.0* 5.8* 5.8* 5.8*  ALBUMIN 3.0* 3.2* 3.2* 3.1* 3.1*    No results for input(s): LIPASE, AMYLASE in the last 168 hours. No results for input(s): AMMONIA in the  last 168 hours.  Coagulation Profile: No results for input(s): INR, PROTIME in the last 168 hours.  Cardiac Enzymes: No results for input(s): CKTOTAL, CKMB, CKMBINDEX, TROPONINI in the last 168 hours. BNP (last 3 results) Recent Labs    12/07/20 1223  PROBNP 87.0    HbA1C: No results for input(s): HGBA1C in the last 72 hours.  CBG: Recent Labs  Lab 03/21/21 1154 03/21/21 1631 03/21/21 2137 03/22/21 0749 03/22/21 1138  GLUCAP 126* 104* 102* 104* 111*    Lipid Profile: No results for input(s): CHOL, HDL, LDLCALC, TRIG, CHOLHDL, LDLDIRECT in the last 72 hours.  Thyroid Function Tests: No results for input(s):  TSH, T4TOTAL, FREET4, T3FREE, THYROIDAB in the last 72 hours. Anemia Panel: No results for input(s): VITAMINB12, FOLATE, FERRITIN, TIBC, IRON, RETICCTPCT in the last 72 hours.  Sepsis Labs: No results for input(s): PROCALCITON, LATICACIDVEN in the last 168 hours.   Recent Results (from the past 240 hour(s))  Blood Culture (routine x 2)     Status: None   Collection Time: 03/12/21  7:35 PM   Specimen: BLOOD  Result Value Ref Range Status   Specimen Description   Final    BLOOD LEFT ANTECUBITAL Performed at [facility] [address] [place], [place] [address]    Special Requests   Final    BOTTLES DRAWN AEROBIC AND ANAEROBIC Blood Culture results may not be optimal due to an excessive volume of blood received in culture bottles Performed at [facility] [address], [place], [place] [address]    Culture   Final    NO GROWTH 5 DAYS Performed at [facility], [place] [address], [place], [place] [address]    Report Status 03/18/2021 FINAL  Final  Blood Culture (routine x 2)     Status: None   Collection Time: 03/12/21  7:38 PM   Specimen: BLOOD  Result Value Ref Range Status   Specimen Description   Final    BLOOD RIGHT ANTECUBITAL Performed at [facility] [address]  [place], [place] [address]    Special Requests   Final    BOTTLES DRAWN AEROBIC AND ANAEROBIC Blood Culture adequate volume Performed at [facility] [address], [place], [place] [address]    Culture   Final    NO GROWTH 5 DAYS Performed at [facility], [place] [address], [place], [place] [address]    Report Status 03/18/2021 FINAL  Final  Resp Panel by RT-PCR (Flu A&B, Covid) Nasopharyngeal Swab     Status: Abnormal   Collection Time: 03/12/21  7:46 PM   Specimen: Nasopharyngeal Swab; Nasopharyngeal(NP) swabs in vial transport medium  Result Value Ref Range Status   SARS Coronavirus 2 by RT PCR POSITIVE (A) NEGATIVE Final    Comment: RESULT CALLED TO, READ BACK BY AND VERIFIED WITH: REBECCA GOODWIN AT 2053 ON 03/12/21 BY MAJ (NOTE) SARS-CoV-2 target nucleic acids are DETECTED.  The SARS-CoV-2 RNA is generally detectable in upper respiratory specimens during the acute phase of infection. Positive results are indicative of the presence of the identified virus, but do not rule out bacterial infection or co-infection with other pathogens not detected by the test. Clinical correlation with patient history and other diagnostic information is necessary to determine patient infection status. The expected result is Negative.  Fact Sheet for Patients: EntrepreneurPulse.com.au  Fact Sheet for Healthcare Providers: IncredibleEmployment.be  This test is not yet approved or cleared by the [place] FDA and  has been authorized for detection and/or diagnosis of SARS-CoV-2 by FDA under an Emergency Use Authorization (EUA).  This EUA will remain in effect (meaning this te st can be used) for the duration of  the COVID-19 declaration under Section 564(b)(1) of the Act, 21 U.S.C. section 360bbb-3(b)(1), unless the authorization is terminated or revoked sooner.     Influenza A by PCR NEGATIVE NEGATIVE Final   Influenza B by PCR  NEGATIVE NEGATIVE Final    Comment: (NOTE) The Xpert Xpress SARS-CoV-2/FLU/RSV plus assay is intended as an aid in the diagnosis of influenza from Nasopharyngeal swab specimens and should not be used as  a sole basis for treatment. Nasal washings and aspirates are unacceptable for Xpert Xpress SARS-CoV-2/FLU/RSV testing.  Fact Sheet for Patients: EntrepreneurPulse.com.au  Fact Sheet for Healthcare Providers: IncredibleEmployment.be  This test is not yet approved or cleared by the [place] FDA and has been authorized for detection and/or diagnosis of SARS-CoV-2 by FDA under an Emergency Use Authorization (EUA). This EUA will remain in effect (meaning this test can be used) for the duration of the COVID-19 declaration under Section 564(b)(1) of the Act, 21 U.S.C. section 360bbb-3(b)(1), unless the authorization is terminated or revoked.  Performed at [facility], [place] [address] [place], [place] [address]   Urine culture     Status: None   Collection Time: 03/12/21  8:09 PM   Specimen: In/Out Cath Urine  Result Value Ref Range Status   Specimen Description   Final    IN/OUT CATH URINE Performed at [facility] [address] [place], [place] [address]    Special Requests   Final    NONE Performed at [facility], [place] [address], [place], [place] [address]    Culture   Final    NO GROWTH Performed at [facility], [place] [address] [place], [place] [address]    Report Status 03/14/2021 FINAL  Final  Blood Culture (routine x 2)     Status: None   Collection Time: 03/14/21 11:11 AM   Specimen: BLOOD  Result Value Ref Range Status   Specimen Description   Final    BLOOD LEFT ANTECUBITAL Performed at [facility] [address], [place], [place] [address]    Special Requests   Final    BOTTLES DRAWN AEROBIC AND ANAEROBIC Blood Culture results may not  be optimal due to an inadequate volume of blood received in culture bottles Performed at [facility] [address] [place], [place] [address]    Culture   Final    NO GROWTH 5 DAYS Performed at [facility], [place] [address] [place], [place] [address]    Report Status 03/19/2021 FINAL  Final     Radiology Studies: No results found.  Scheduled Meds:  apixaban  5 mg Oral BID   atorvastatin  20 mg Oral Daily   azaTHIOprine  200 mg Oral Daily   divalproex  750 mg Oral Q12H   DULoxetine  60 mg Oral Daily   furosemide  40 mg Oral Once per day on Sun Thu   insulin aspart  0-15 Units Subcutaneous TID WC   insulin aspart  0-5 Units Subcutaneous QHS   levothyroxine  50 mcg Oral Q0600   mouth rinse  15 mL Mouth Rinse BID   melatonin  10 mg Oral QHS   metoprolol tartrate  12.5 mg Oral BID   tamsulosin  0.4 mg Oral Daily   Continuous Infusions:     LOS: 8 days    Time spent: 29 mins  Darliss Cheney, MD [facility]   If 7PM-7AM, please contact night-coverage

## 2021-03-23 DIAGNOSIS — G9341 Metabolic encephalopathy: Secondary | ICD-10-CM | POA: Diagnosis not present

## 2021-03-23 DIAGNOSIS — E038 Other specified hypothyroidism: Secondary | ICD-10-CM | POA: Diagnosis not present

## 2021-03-23 DIAGNOSIS — U071 COVID-19: Secondary | ICD-10-CM | POA: Diagnosis not present

## 2021-03-23 DIAGNOSIS — R531 Weakness: Secondary | ICD-10-CM | POA: Diagnosis not present

## 2021-03-23 DIAGNOSIS — E7849 Other hyperlipidemia: Secondary | ICD-10-CM | POA: Diagnosis not present

## 2021-03-23 DIAGNOSIS — K51918 Ulcerative colitis, unspecified with other complication: Secondary | ICD-10-CM | POA: Diagnosis not present

## 2021-03-23 DIAGNOSIS — Z7401 Bed confinement status: Secondary | ICD-10-CM | POA: Diagnosis not present

## 2021-03-23 DIAGNOSIS — Z8616 Personal history of COVID-19: Secondary | ICD-10-CM | POA: Diagnosis not present

## 2021-03-23 DIAGNOSIS — F324 Major depressive disorder, single episode, in partial remission: Secondary | ICD-10-CM | POA: Diagnosis not present

## 2021-03-23 DIAGNOSIS — I4891 Unspecified atrial fibrillation: Secondary | ICD-10-CM | POA: Diagnosis not present

## 2021-03-23 DIAGNOSIS — K5190 Ulcerative colitis, unspecified, without complications: Secondary | ICD-10-CM | POA: Diagnosis not present

## 2021-03-23 DIAGNOSIS — F0390 Unspecified dementia without behavioral disturbance: Secondary | ICD-10-CM | POA: Diagnosis not present

## 2021-03-23 DIAGNOSIS — I5032 Chronic diastolic (congestive) heart failure: Secondary | ICD-10-CM | POA: Diagnosis not present

## 2021-03-23 DIAGNOSIS — F0280 Dementia in other diseases classified elsewhere without behavioral disturbance: Secondary | ICD-10-CM | POA: Diagnosis not present

## 2021-03-23 DIAGNOSIS — E039 Hypothyroidism, unspecified: Secondary | ICD-10-CM | POA: Diagnosis not present

## 2021-03-23 DIAGNOSIS — I480 Paroxysmal atrial fibrillation: Secondary | ICD-10-CM | POA: Diagnosis not present

## 2021-03-23 DIAGNOSIS — F419 Anxiety disorder, unspecified: Secondary | ICD-10-CM | POA: Diagnosis not present

## 2021-03-23 DIAGNOSIS — F32A Depression, unspecified: Secondary | ICD-10-CM | POA: Diagnosis not present

## 2021-03-23 DIAGNOSIS — G9340 Encephalopathy, unspecified: Secondary | ICD-10-CM | POA: Diagnosis not present

## 2021-03-23 DIAGNOSIS — I5031 Acute diastolic (congestive) heart failure: Secondary | ICD-10-CM | POA: Diagnosis not present

## 2021-03-23 LAB — GLUCOSE, CAPILLARY
Glucose-Capillary: 90 mg/dL (ref 70–99)
Glucose-Capillary: 99 mg/dL (ref 70–99)

## 2021-03-23 MED ORDER — METOPROLOL TARTRATE 25 MG PO TABS: 12.5000 mg | ORAL_TABLET | Freq: Two times a day (BID) | ORAL | 0 refills | Status: DC

## 2021-03-23 NOTE — Discharge Summary (Signed)
Physician Discharge Summary  LAVON [name] Sr. TFT:[id number] DOB: [birth year] DOA: 03/14/2021  PCP: Howard Pouch A, DO  Admit date: 03/14/2021 Discharge date: 03/23/2021 30 Day Unplanned Readmission Risk Score    Flowsheet Row ED to Hosp-Admission (Current) from 03/14/2021 in [place]  30 Day Unplanned Readmission Risk Score (%) 28.86 Filed at 03/23/2021 0801       This score is the patient's risk of an unplanned readmission within 30 days of being discharged (0 -100%). The score is based on dignosis, age, lab data, medications, orders, and past utilization.   Low:  0-14.9   Medium: 15-21.9   High: 22-29.9   Extreme: 30 and above          Admitted From: Home Disposition: SNF  Recommendations for Outpatient Follow-up:  Follow up with PCP in 1-2 weeks Please obtain BMP/CBC in one week Please follow up with your PCP on the following pending results: Unresulted Labs (From admission, onward)    None         Home Health: None Equipment/Devices: None  Discharge Condition: Stable CODE STATUS: DNR Diet recommendation: Cardiac  Subjective: Seen and examined, alert and oriented to self, at his baseline.  No complaints.  Brief/Interim Summary: This 70 years old male with PMH significant for dementia, atrial fibrillation, ulcerative colitis, depression brought in the ED after he was found increasingly confused at home.  Patient was not communicating and not himself for the last 3 days. Patient has come to the ER 3 days ago with confusion and weakness and at that time he was diagnosed with COVID and he was discharged home on oral antiviral medication. He has not been communicating and not following commands.  Due to worsening weakness and encephalopathy patient was brought back in the ED.  CT head is unremarkable.  MRI head is unremarkable, No acute intracranial abnormality.  ABG does not show hypercarbia.  Patient is admitted for possible  encephalopathy due to [place].  Encephalopathy has resolved.  Patient was treated with IV remdesivir for COVID-19 infection, he was never hypoxic.  Rest of the medical issues remained stable.  EEG was negative for any seizure activity.  UA, chest x-ray and blood culture remain negative.  He was evaluated by SLP and dysphagia 2 diet was started.  Patient is now alert and oriented to self and sometimes to place, this is his baseline according to the wife.  PT OT recommended discharging to SNF and he is being discharged in stable condition today.  Plan of care discussed with wife.  Discharge Diagnoses:  Principal Problem:   Acute encephalopathy Active Problems:   Dementia (HCC)   Hypothyroidism   Chronic ulcerative pancolitis (HCC)   Chronic diastolic CHF (congestive heart failure) (HCC)   Paroxysmal atrial fibrillation (HCC)   Weakness    Discharge Instructions   Allergies as of 03/23/2021       Reactions   Losartan Potassium-hctz Diarrhea   Nsaids Other (See Comments)   On eliquis.    Zoloft [sertraline Hcl] Diarrhea   Benzodiazepines Other (See Comments)   Agitation.        Medication List     STOP taking these medications    molnupiravir EUA 200 mg Caps   polyethylene glycol 17 g packet Commonly known as: MiraLax       TAKE these medications    acetaminophen 500 MG tablet Commonly known as: TYLENOL Take 500 mg by mouth every 6 (six) hours as needed  for mild pain, headache or fever.   apixaban 5 MG Tabs tablet Commonly known as: ELIQUIS Take 1 tablet (5 mg total) by mouth 2 (two) times daily.   atorvastatin 20 MG tablet Commonly known as: LIPITOR Take 1 tablet (20 mg total) by mouth daily. What changed: when to take this   azaTHIOprine 50 MG tablet Commonly known as: IMURAN Take 200 mg by mouth every morning.   divalproex 125 MG capsule Commonly known as: DEPAKOTE SPRINKLE Take 6 capsules (750 mg total) by mouth every 12 (twelve) hours.   docusate  sodium 100 MG capsule Commonly known as: Colace Take 1 capsule (100 mg total) by mouth 2 (two) times daily.   DULoxetine 60 MG capsule Commonly known as: CYMBALTA Take 1 capsule (60 mg total) by mouth daily.   furosemide 40 MG tablet Commonly known as: LASIX Take 1 tablet (40 mg total) by mouth 2 (two) times a week. What changed: additional instructions   levocetirizine 5 MG tablet Commonly known as: XYZAL Take 5 mg by mouth every evening.   levothyroxine 50 MCG tablet Commonly known as: Synthroid Take 1 tablet (50 mcg total) by mouth daily before breakfast.   Melatonin 10 MG Tabs Take 10 mg by mouth at bedtime.   metoprolol tartrate 25 MG tablet Commonly known as: LOPRESSOR Take 0.5 tablets (12.5 mg total) by mouth 2 (two) times daily.   OLANZapine 10 MG tablet Commonly known as: ZYPREXA 1/2 tab in the morning and 1.5 tabs QHS What changed:  how much to take how to take this when to take this additional instructions   potassium chloride 10 MEQ tablet Commonly known as: Klor-Con M10 Take 1 tablet (10 mEq total) by mouth daily.   tamsulosin 0.4 MG Caps capsule Commonly known as: FLOMAX Take 1 capsule (0.4 mg total) by mouth daily.   traZODone 50 MG tablet Commonly known as: DESYREL TAKE 1 TABLET BY MOUTH EVERYDAY AT BEDTIME What changed: See the new instructions.        Follow-up Information     [name], [name], DO Follow up in 1 week(s).   Specialty: Family Medicine Contact information: [address] [place] [address] [phone]         Troy Sine, MD .   Specialty: Cardiology Contact information: [place] [address] [place] [address] [phone]                Allergies  Allergen Reactions   Losartan Potassium-Hctz Diarrhea   Nsaids Other (See Comments)    On eliquis.    Zoloft [Sertraline Hcl] Diarrhea   Benzodiazepines Other (See Comments)    Agitation.    Consultations:  None   Procedures/Studies: CT Head Wo Contrast  Result Date: 03/14/2021 CLINICAL DATA:  Mental status change. EXAM: CT HEAD WITHOUT CONTRAST TECHNIQUE: Contiguous axial images were obtained from the base of the skull through the vertex without intravenous contrast. COMPARISON:  March 10, 2021. FINDINGS: Brain: No evidence of acute large vascular territory infarction, hemorrhage, hydrocephalus, extra-axial collection or mass lesion/mass effect. Similar cerebral atrophy with ex vacuo ventricular dilation. Similar patchy white matter hypoattenuation, nonspecific but most likely related to chronic microvascular ischemic disease. Vascular: No hyperdense vessel identified. Calcific atherosclerosis. Skull: No acute fracture. Sinuses/Orbits: Visualized sinuses are clear. No acute orbital findings. Other: No mastoid effusions. IMPRESSION: 1. No evidence of acute intracranial abnormality. 2. Similar chronic microvascular ischemic disease and atrophy. Electronically Signed   By: Margaretha Sheffield MD   On:  03/14/2021 12:16   CT Head Wo Contrast  Result Date: 03/12/2021 CLINICAL DATA:  Mental status change for unknown cause. History of advanced Alzheimer's. EXAM: CT HEAD WITHOUT CONTRAST TECHNIQUE: Contiguous axial images were obtained from the base of the skull through the vertex without intravenous contrast. COMPARISON:  01/10/2021 . FINDINGS: Brain: No evidence of acute infarction, hemorrhage, hydrocephalus, extra-axial collection or mass lesion/mass effect. Diffuse cerebral atrophy. Ventricular dilatation likely due to central atrophy. Low-attenuation changes in the deep white matter likely due to small vessel ischemia. Vascular: Moderate intracranial arterial vascular calcifications. Skull: Calvarium appears intact. Sinuses/Orbits: Paranasal sinuses and mastoid air cells are clear. Other: None. IMPRESSION: No acute intracranial abnormalities. Chronic atrophy and small vessel ischemic changes. Electronically Signed    By: Lucienne Capers M.D.   On: 03/12/2021 21:04   MR BRAIN WO CONTRAST  Result Date: 03/14/2021 CLINICAL DATA:  Neuro deficit, acute, stroke suspected. Additional provided: Patient has become unresponsive. EXAM: MRI HEAD WITHOUT CONTRAST TECHNIQUE: Multiplanar, multiecho pulse sequences of the brain and surrounding structures were obtained without intravenous contrast. COMPARISON:  Prior head CT examinations 03/14/2021 and earlier. Brain MRI 02/18/2019. FINDINGS: Brain: The examination is intermittently motion degraded. Most notably, there is mild-to-moderate motion degradation of the axial T2 FLAIR sequence, and moderate motion degradation of the coronal T2 sequence. Moderate generalized cerebral atrophy. Comparatively mild cerebellar atrophy. Mild multifocal T2/FLAIR hyperintensity within the cerebral white matter, nonspecific but compatible with chronic small vessel ischemic disease. There is no acute infarct. No evidence of an intracranial mass. No chronic intracranial blood products. No extra-axial fluid collection. No midline shift. Vascular: Expected proximal arterial flow voids. Skull and upper cervical spine: No focal marrow lesion. Sinuses/Orbits: Visualized orbits show no acute finding. Trace bilateral ethmoid and sphenoid sinus mucosal thickening. Other: Trace fluid within the bilateral mastoid air cells. IMPRESSION: Intermittently motion degraded examination, as described. No evidence of acute intracranial abnormality. Mild cerebral white matter chronic small vessel ischemic disease, slightly progressed from the brain MRI of 02/18/2019. Moderate generalized cerebral atrophy with comparatively mild cerebellar atrophy, stable. Trace fluid within the bilateral mastoid air cells. Electronically Signed   By: Kellie Simmering DO   On: 03/14/2021 20:16   DG Chest Portable 1 View  Result Date: 03/14/2021 CLINICAL DATA:  Altered mental status.  COVID-19 positive on Sunday. EXAM: PORTABLE CHEST 1 VIEW  COMPARISON:  03/12/2021 FINDINGS: Midline trachea. Moderate cardiomegaly. Superior mediastinal soft tissue thickening is chronic and attributed to AP portable technique and low lung volumes. No pleural effusion or pneumothorax. No congestive failure. There may be mild left base atelectasis or scarring. No well-defined lobar consolidation. IMPRESSION: No acute cardiopulmonary disease. Cardiomegaly without congestive failure. Electronically Signed   By: Abigail Miyamoto M.D.   On: 03/14/2021 11:29   DG Chest Port 1 View  Result Date: 03/12/2021 CLINICAL DATA:  Urosepsis.  Altered mental status. EXAM: PORTABLE CHEST 1 VIEW COMPARISON:  01/10/2021 FINDINGS: Lordotic positioning noted. Heart size is stable lung for differences in positioning. Both lungs are clear. IMPRESSION: No active disease. Electronically Signed   By: Marlaine Hind M.D.   On: 03/12/2021 20:02   EEG adult  Result Date: 03/15/2021 Lora Havens, MD     03/15/2021  6:37 PM Patient Name: [name] [facility] Sr. MRN: [id number] Epilepsy Attending: Lora Havens Referring Physician/Provider: Dr Gean Birchwood Date: 03/15/2021 Duration: 22.50 mins Patient history: 70yo M with ams. EEG to evaluate for seizure Level of alertness: Awake AEDs during EEG study: None Technical aspects: This  EEG study was done with scalp electrodes positioned according to the 10-20 International system of electrode placement. Electrical activity was acquired at a sampling rate of 500Hz  and reviewed with a high frequency filter of 70Hz  and a low frequency filter of 1Hz . EEG data were recorded continuously and digitally stored. Description: The posterior dominant rhythm consists of 7.5 Hz activity of moderate voltage (25-35 uV) seen predominantly in posterior head regions, symmetric and reactive to eye opening and eye closing. EEG showed continuous generalized 5 to 6 Hz theta as well as intermittent 2-3Hz  delta slowing. Hyperventilation and photic stimulation were not performed.    ABNORMALITY - Continuous slow, generalized IMPRESSION: This study is suggestive of moderate diffuse encephalopathy, nonspecific etiology. No seizures or epileptiform discharges were seen throughout the recording. Lora Havens     Discharge Exam: Vitals:   03/22/21 2239 03/23/21 0505  BP: 104/73 (!) 93/58  Pulse: 70 71  Resp:  18  Temp:  97.7 F (36.5 C)  SpO2: 97% 98%   Vitals:   03/22/21 1937 03/22/21 2109 03/22/21 2239 03/23/21 0505  BP: 118/60 106/70 104/73 (!) 93/58  Pulse: 91 81 70 71  Resp: 20 18  18   Temp: 98.4 F (36.9 C) 98.7 F (37.1 C)  97.7 F (36.5 C)  TempSrc:      SpO2: 96% 97% 97% 98%  Weight:      Height:        General: Pt is alert, awake, not in acute distress Cardiovascular: RRR, S1/S2 +, no rubs, no gallops Respiratory: CTA bilaterally, no wheezing, no rhonchi Abdominal: Soft, NT, ND, bowel sounds + Extremities: no edema, no cyanosis    The results of significant diagnostics from this hospitalization (including imaging, microbiology, ancillary and laboratory) are listed below for reference.     Microbiology: Recent Results (from the past 240 hour(s))  Blood Culture (routine x 2)     Status: None   Collection Time: 03/14/21 11:11 AM   Specimen: BLOOD  Result Value Ref Range Status   Specimen Description   Final    BLOOD LEFT ANTECUBITAL Performed at [facility] [address] [place], [place] [address]    Special Requests   Final    BOTTLES DRAWN AEROBIC AND ANAEROBIC Blood Culture results may not be optimal due to an inadequate volume of blood received in culture bottles Performed at [facility] [address] [place], [place] [address]    Culture   Final    NO GROWTH 5 DAYS Performed at [facility], [place] [address] [place], [place] [address]    Report Status 03/19/2021 FINAL  Final     Labs: BNP (last 3 results) Recent Labs    01/10/21 1254  BNP 48.8   Basic Metabolic  Panel: Recent Labs  Lab 03/17/21 0412 03/18/21 0343 03/19/21 0355 03/21/21 0357 03/22/21 0421  NA 138 139 141 138 141  K 3.5 3.5 3.6 3.2* 3.7  CL 99 100 101 101 104  CO2 29 33* 32 28 29  GLUCOSE 109* 97 101* 132* 100*  BUN 16 19 18 17 14   CREATININE 0.84 1.06 0.95 0.78 0.81  CALCIUM 8.6* 8.5* 8.7* 8.8* 8.9  MG 2.0  --  2.1 2.0 2.0  PHOS 3.6  --  3.9 3.6 3.7   Liver Function Tests: Recent Labs  Lab 03/17/21 0412 03/18/21 0343 03/19/21 0355 03/21/21 0357  AST 30 26 19 20   ALT 16 17 14 12   ALKPHOS 33*  36* 39 42  BILITOT 0.9 1.0 1.3* 1.5*  PROT 6.0* 5.8* 5.8* 5.8*  ALBUMIN 3.2* 3.2* 3.1* 3.1*   No results for input(s): LIPASE, AMYLASE in the last 168 hours. No results for input(s): AMMONIA in the last 168 hours. CBC: Recent Labs  Lab 03/17/21 0412 03/18/21 0343 03/19/21 0355 03/21/21 0357 03/22/21 0421  WBC 3.9* 3.7* 4.2 5.6 5.0  NEUTROABS 2.5 2.1 2.5  --   --   HGB 15.0 14.9 14.8 15.1 14.9  HCT 43.4 43.3 43.4 44.2 43.7  MCV 102.4* 102.9* 104.1* 102.6* 103.8*  PLT 136* 125* 135* 155 156   Cardiac Enzymes: No results for input(s): CKTOTAL, CKMB, CKMBINDEX, TROPONINI in the last 168 hours. BNP: Invalid input(s): POCBNP CBG: Recent Labs  Lab 03/22/21 1138 03/22/21 1806 03/22/21 2110 03/22/21 2237 03/23/21 0813  GLUCAP 111* 93 133* 109* 90   D-Dimer No results for input(s): DDIMER in the last 72 hours. Hgb A1c No results for input(s): HGBA1C in the last 72 hours. Lipid Profile No results for input(s): CHOL, HDL, LDLCALC, TRIG, CHOLHDL, LDLDIRECT in the last 72 hours. Thyroid function studies No results for input(s): TSH, T4TOTAL, T3FREE, THYROIDAB in the last 72 hours.  Invalid input(s): FREET3 Anemia work up No results for input(s): VITAMINB12, FOLATE, FERRITIN, TIBC, IRON, RETICCTPCT in the last 72 hours. Urinalysis    Component Value Date/Time   COLORURINE YELLOW 03/14/2021 1155   APPEARANCEUR CLEAR 03/14/2021 1155   LABSPEC 1.011 03/14/2021  1155   PHURINE 6.0 03/14/2021 1155   GLUCOSEU NEGATIVE 03/14/2021 1155   HGBUR NEGATIVE 03/14/2021 [place] 03/14/2021 1155   BILIRUBINUR negative 01/06/2021 1407   BILIRUBINUR negative 01/03/2018 0959   KETONESUR 20 (A) 03/14/2021 1155   PROTEINUR NEGATIVE 03/14/2021 1155   UROBILINOGEN 4.0 (A) 01/06/2021 1407   NITRITE NEGATIVE 03/14/2021 1155   LEUKOCYTESUR NEGATIVE 03/14/2021 1155   Sepsis Labs Invalid input(s): PROCALCITONIN,  WBC,  LACTICIDVEN Microbiology Recent Results (from the past 240 hour(s))  Blood Culture (routine x 2)     Status: None   Collection Time: 03/14/21 11:11 AM   Specimen: BLOOD  Result Value Ref Range Status   Specimen Description   Final    BLOOD LEFT ANTECUBITAL Performed at [facility] [address] [place], [place] [address]    Special Requests   Final    BOTTLES DRAWN AEROBIC AND ANAEROBIC Blood Culture results may not be optimal due to an inadequate volume of blood received in culture bottles Performed at [facility] [address] [place], [place] [address]    Culture   Final    NO GROWTH 5 DAYS Performed at [facility], [place] [address] [place], [place] [address]    Report Status 03/19/2021 FINAL  Final     Time coordinating discharge: Over 30 minutes  SIGNED:   Darliss Cheney, MD  [facility] 03/23/2021, 10:40 AM  If 7PM-7AM, please contact night-coverage [email or web address]

## 2021-03-23 NOTE — TOC Transition Note (Signed)
Transition of Care [facility]) - CM/SW Discharge Note   Patient Details  Name: [name] DOUBEK Sr. MRN: [id number] Date of Birth: 1951/05/14  Transition of Care [facility]) CM/SW Contact:  [place] Ludwig, LCSW Phone Number: 03/23/2021, 12:29 PM   Clinical Narrative:     Patient to be d/c'ed today to [facility] in [place] room 312A.  Patient and family agreeable to plans will transport via ems RN to call report to [phone].  Patient's wife is aware that patient is discharging today.     Final next level of care: Skilled Nursing Facility Barriers to Discharge: Barriers Resolved   Patient Goals and CMS Choice Patient states their goals for this hospitalization and ongoing recovery are:: To go to SNF for rehab, then return back home with home health. CMS Medicare.gov Compare Post Acute Care list provided to:: Patient Represenative (must comment) Choice offered to / list presented to : Spouse  Discharge Placement PASRR number recieved: 03/20/21            [facility]  Patient to be transferred to facility by: [facility] Name of family member notified: Wife [name] Patient and family notified of of transfer: 03/23/21  Discharge Plan and Services In-house Referral: Clinical Social Work   Post Acute Care Choice: [place]                               Social Determinants of Health (SDOH) Interventions     Readmission Risk Interventions Readmission Risk Prevention Plan 02/18/2019  Transportation Screening Complete  PCP or Specialist Appt within 3-5 Days Complete  HRI or [place] Complete  Social Work Consult for [place] Planning/Counseling Complete  Palliative Care Screening Not Applicable  Medication Review [employer]) Complete  Some recent data might be hidden

## 2021-03-23 NOTE — TOC Progression Note (Addendum)
Transition of Care (TOC) - Progression Note    Patient Details  Name: [name]. MRN: [id number] Date of Birth: May 11, 1951  Transition of Care [facility]) CM/SW Contact  [place] Ludwig, Bartholomew Phone Number: 03/23/2021, 9:41 AM  Clinical Narrative:     CSW attempted to contact patient's wife left messages on voice mail for home and cell phone.  CSW awaiting to hear back from patient's wife if she can afford the copay.  CSW to continue to facilitate discharge planning.  10:20am  CSW spoke to patient's wife, and she is agreeable to paying the copay for patient to go to [facility]  CSW spoke to SNF and they can accept patient today.  CSW updated attending physician.   Expected Discharge Plan: [place] Barriers to Discharge: Continued Medical Work up  Expected Discharge Plan and Services Expected Discharge Plan: [place] In-house Referral: Clinical Social Work   Post Acute Care Choice: [place] Living arrangements for the past 2 months: Single Family Home Expected Discharge Date: 03/23/21                                     Social Determinants of Health (SDOH) Interventions    Readmission Risk Interventions Readmission Risk Prevention Plan 02/18/2019  Transportation Screening Complete  PCP or Specialist Appt within 3-5 Days Complete  HRI or [place] Complete  Social Work Consult for [employer] Planning/Counseling Complete  Palliative Care Screening Not Applicable  Medication Review [employer] photographer) Complete  Some recent data might be hidden

## 2021-03-23 NOTE — Progress Notes (Signed)
Attempted to call report, transferred to nurses station, no one answered.

## 2021-03-23 NOTE — Progress Notes (Signed)
Occupational Therapy Treatment Patient Details Name: [name] CARRAS Sr. MRN: [id number] DOB: [birth year] Today's Date: 03/23/2021    History of present illness 70 years old male brought in the ED after he was found increasingly confused at home and admitted for acute encephalopathy in setting of Covid-19 infection.   PMH significant for dementia, atrial fibrillation, ulcerative colitis, depression   OT comments  Patient was noted to have continued confusion during session. Patient was mod A for supine to sit on edge of bed with education for proper hand and foot placement. Patient required increased time and multimodal cues for sit to stand and to participate in transfer. Patient required mod A to transfer from edge of bed to recliner in room with rolling walker. Patient was noted to have posterior loss of balance when attempting to transfer with increased assistance needed to remain upright. Patient was able to participate in gentle ROM of BUE to maintain ROM. Patient d/c location remains appropriate. Patient to transition to SNF at time of d/c.   Follow Up Recommendations  SNF    Equipment Recommendations     Defer to next venue   Recommendations for Other Services      Precautions / Restrictions Precautions Precautions: Fall Precaution Comments: currently in mittens; Airborne/Contact Restrictions Weight Bearing Restrictions: No       Mobility Bed Mobility Overal bed mobility: Needs Assistance Bed Mobility: Supine to Sit;Sit to Supine     Supine to sit: Mod assist     General bed mobility comments: Mod A for trunk and LEs. Increased time.    Transfers Overall transfer level: Needs assistance Equipment used: Rolling walker (2 wheeled) Transfers: Sit to/from Stand Sit to Stand: Min assist;From elevated surface Stand pivot transfers: Mod assist       General transfer comment: Assist to power up, stabilize, control descent. Multimodal cueing required. Increased time.  patient was noted to have one loss of balance posterioly with min A to maintain standing balace with rolling walker.    Balance Overall balance assessment: Needs assistance   Sitting balance-Leahy Scale: Fair     Standing balance support: Bilateral upper extremity supported Standing balance-Leahy Scale: Poor                 High Level Balance Comments: with rolling walker and one loss of balance posteriorly.           ADL either performed or assessed with clinical judgement   ADL Overall ADL's : Needs assistance/impaired                                             Vision       Perception     Praxis      Cognition Arousal/Alertness: Awake/alert Behavior During Therapy: Flat affect Overall Cognitive Status: No family/caregiver present to determine baseline cognitive functioning Area of Impairment: Orientation;Following commands;Safety/judgement;Awareness;Problem solving;Memory                 Orientation Level: Disoriented to;Place;Time;Situation   Memory: Decreased short-term memory;Decreased recall of precautions Following Commands: Follows one step commands inconsistently Safety/Judgement: Decreased awareness of safety;Decreased awareness of deficits Awareness: Intellectual Problem Solving: Slow processing;Decreased initiation;Requires verbal cues;Requires tactile cues;Difficulty sequencing General Comments: hx of dementia, inconsistent ability to follow 1-step commands with need of increased time for processing.  Very impaired memory.        Exercises  Shoulder Instructions       General Comments      Pertinent Vitals/ Pain       Pain Assessment: No/denies pain Faces Pain Scale: No hurt  Home Living                                          Prior Functioning/Environment              Frequency  Min 2X/week        Progress Toward Goals  OT Goals(current goals can now be found in the care  plan section)  Progress towards OT goals: Progressing toward goals  Acute Rehab OT Goals OT Goal Formulation: Patient unable to participate in goal setting Potential to Achieve Goals: [place] Discharge plan remains appropriate    Co-evaluation                 AM-PAC OT "6 Clicks" Daily Activity     Outcome Measure   Help from another person eating meals?: A Little Help from another person taking care of personal grooming?: A Little Help from another person toileting, which includes using toliet, bedpan, or urinal?: Total   Help from another person to put on and taking off regular upper body clothing?: A Little Help from another person to put on and taking off regular lower body clothing?: Total 6 Click Score: 11    End of Session Equipment Utilized During Treatment: Gait belt;Rolling walker  OT Visit Diagnosis: Cognitive communication deficit (R41.841);Unsteadiness on feet (R26.81)   Activity Tolerance Patient tolerated treatment well   Patient Left in chair;with call bell/phone within reach;with chair alarm set   Nurse Communication Other (comment) (nursing made aware patient was seated in chair at end of session.)        Time: 0900-0921 OT Time Calculation (min): 21 min  Charges: OT General Charges $OT Visit: 1 Visit OT Treatments $Self Care/Home Management : 8-22 mins  Jackelyn Poling OTR/L, [place] Acute Rehabilitation Department Office# [phone] Pager# [phone]    [place] 03/23/2021, 10:13 AM

## 2021-03-23 NOTE — Plan of Care (Signed)
  Problem: Education: Goal: Knowledge of General Education information will improve Description: Including pain rating scale, medication(s)/side effects and non-pharmacologic comfort measures Outcome: Progressing   Problem: Health Behavior/Discharge Planning: Goal: Ability to manage health-related needs will improve Outcome: Progressing   Problem: Clinical Measurements: Goal: Respiratory complications will improve Outcome: Progressing   Problem: Nutrition: Goal: Adequate nutrition will be maintained Outcome: Progressing   Problem: Safety: Goal: Ability to remain free from injury will improve Outcome: Progressing   Problem: Skin Integrity: Goal: Risk for impaired skin integrity will decrease Outcome: Progressing   Problem: Education: Goal: Knowledge of risk factors and measures for prevention of condition will improve Outcome: Progressing   Problem: Coping: Goal: Psychosocial and spiritual needs will be supported Outcome: Progressing   Problem: Respiratory: Goal: Will maintain a patent airway Outcome: Progressing Goal: Complications related to the disease process, condition or treatment will be avoided or minimized Outcome: Progressing

## 2021-03-28 ENCOUNTER — Telehealth: Payer: Self-pay | Admitting: Family Medicine

## 2021-03-28 DIAGNOSIS — U071 COVID-19: Secondary | ICD-10-CM | POA: Diagnosis not present

## 2021-03-28 DIAGNOSIS — E038 Other specified hypothyroidism: Secondary | ICD-10-CM | POA: Diagnosis not present

## 2021-03-28 DIAGNOSIS — I5031 Acute diastolic (congestive) heart failure: Secondary | ICD-10-CM | POA: Diagnosis not present

## 2021-03-28 DIAGNOSIS — I480 Paroxysmal atrial fibrillation: Secondary | ICD-10-CM | POA: Diagnosis not present

## 2021-03-28 DIAGNOSIS — K5190 Ulcerative colitis, unspecified, without complications: Secondary | ICD-10-CM | POA: Diagnosis not present

## 2021-03-28 DIAGNOSIS — E7849 Other hyperlipidemia: Secondary | ICD-10-CM | POA: Diagnosis not present

## 2021-03-28 DIAGNOSIS — G9341 Metabolic encephalopathy: Secondary | ICD-10-CM | POA: Diagnosis not present

## 2021-03-28 DIAGNOSIS — F324 Major depressive disorder, single episode, in partial remission: Secondary | ICD-10-CM | POA: Diagnosis not present

## 2021-03-28 DIAGNOSIS — F0280 Dementia in other diseases classified elsewhere without behavioral disturbance: Secondary | ICD-10-CM | POA: Diagnosis not present

## 2021-03-28 NOTE — Telephone Encounter (Signed)
Received faxed orders from [facility], marked urgent. Placed in Dr. Lucita Lora front office inbox to be signed and returned.

## 2021-03-29 NOTE — Telephone Encounter (Signed)
Received fax. This is second fax request and first has already been sent

## 2021-03-30 ENCOUNTER — Telehealth: Payer: Self-pay | Admitting: Family Medicine

## 2021-03-30 NOTE — Telephone Encounter (Signed)
LVM for pt wife to inform her that we have already sent in a fax.   Note: anything additional will need to wait until appt

## 2021-03-30 NOTE — Telephone Encounter (Signed)
Patient recently released from hospital and has [place] scheduled 04/04/21. Patient's wife would like to know if home care with PT order can be sent to [facility] before that time.

## 2021-03-30 NOTE — Telephone Encounter (Signed)
Noted. Thanks.

## 2021-03-30 NOTE — Telephone Encounter (Signed)
Patient's wife returned call and I relayed below message. She voiced understanding.

## 2021-04-01 DIAGNOSIS — I2510 Atherosclerotic heart disease of native coronary artery without angina pectoris: Secondary | ICD-10-CM | POA: Diagnosis not present

## 2021-04-01 DIAGNOSIS — G9341 Metabolic encephalopathy: Secondary | ICD-10-CM | POA: Diagnosis not present

## 2021-04-01 DIAGNOSIS — I480 Paroxysmal atrial fibrillation: Secondary | ICD-10-CM | POA: Diagnosis not present

## 2021-04-01 DIAGNOSIS — F0281 Dementia in other diseases classified elsewhere with behavioral disturbance: Secondary | ICD-10-CM | POA: Diagnosis not present

## 2021-04-01 DIAGNOSIS — N400 Enlarged prostate without lower urinary tract symptoms: Secondary | ICD-10-CM | POA: Diagnosis not present

## 2021-04-01 DIAGNOSIS — I5032 Chronic diastolic (congestive) heart failure: Secondary | ICD-10-CM | POA: Diagnosis not present

## 2021-04-01 DIAGNOSIS — K5190 Ulcerative colitis, unspecified, without complications: Secondary | ICD-10-CM | POA: Diagnosis not present

## 2021-04-01 DIAGNOSIS — E538 Deficiency of other specified B group vitamins: Secondary | ICD-10-CM | POA: Diagnosis not present

## 2021-04-01 DIAGNOSIS — I110 Hypertensive heart disease with heart failure: Secondary | ICD-10-CM | POA: Diagnosis not present

## 2021-04-03 DIAGNOSIS — E538 Deficiency of other specified B group vitamins: Secondary | ICD-10-CM | POA: Diagnosis not present

## 2021-04-03 DIAGNOSIS — N400 Enlarged prostate without lower urinary tract symptoms: Secondary | ICD-10-CM | POA: Diagnosis not present

## 2021-04-03 DIAGNOSIS — K5190 Ulcerative colitis, unspecified, without complications: Secondary | ICD-10-CM | POA: Diagnosis not present

## 2021-04-03 DIAGNOSIS — I5032 Chronic diastolic (congestive) heart failure: Secondary | ICD-10-CM | POA: Diagnosis not present

## 2021-04-03 DIAGNOSIS — I2510 Atherosclerotic heart disease of native coronary artery without angina pectoris: Secondary | ICD-10-CM | POA: Diagnosis not present

## 2021-04-03 DIAGNOSIS — G9341 Metabolic encephalopathy: Secondary | ICD-10-CM | POA: Diagnosis not present

## 2021-04-03 DIAGNOSIS — I110 Hypertensive heart disease with heart failure: Secondary | ICD-10-CM | POA: Diagnosis not present

## 2021-04-03 DIAGNOSIS — I480 Paroxysmal atrial fibrillation: Secondary | ICD-10-CM | POA: Diagnosis not present

## 2021-04-03 DIAGNOSIS — F0281 Dementia in other diseases classified elsewhere with behavioral disturbance: Secondary | ICD-10-CM | POA: Diagnosis not present

## 2021-04-04 ENCOUNTER — Encounter: Payer: Self-pay | Admitting: Family Medicine

## 2021-04-04 ENCOUNTER — Other Ambulatory Visit: Payer: Self-pay

## 2021-04-04 ENCOUNTER — Ambulatory Visit (INDEPENDENT_AMBULATORY_CARE_PROVIDER_SITE_OTHER): Payer: Medicare HMO | Admitting: Family Medicine

## 2021-04-04 VITALS — BP 108/64 | HR 105 | Ht 67.0 in | Wt 221.0 lb

## 2021-04-04 DIAGNOSIS — Z8616 Personal history of COVID-19: Secondary | ICD-10-CM | POA: Diagnosis not present

## 2021-04-04 DIAGNOSIS — Z9289 Personal history of other medical treatment: Secondary | ICD-10-CM

## 2021-04-04 DIAGNOSIS — Z66 Do not resuscitate: Secondary | ICD-10-CM

## 2021-04-04 DIAGNOSIS — Z7189 Other specified counseling: Secondary | ICD-10-CM | POA: Diagnosis not present

## 2021-04-04 DIAGNOSIS — U071 COVID-19: Secondary | ICD-10-CM | POA: Insufficient documentation

## 2021-04-04 DIAGNOSIS — F0390 Unspecified dementia without behavioral disturbance: Secondary | ICD-10-CM | POA: Diagnosis not present

## 2021-04-04 DIAGNOSIS — G9340 Encephalopathy, unspecified: Secondary | ICD-10-CM

## 2021-04-04 HISTORY — DX: COVID-19: U07.1

## 2021-04-04 MED ORDER — DULOXETINE HCL 60 MG PO CPEP: 60.0000 mg | ORAL_CAPSULE | Freq: Every day | ORAL | 1 refills | Status: DC

## 2021-04-04 MED ORDER — DIVALPROEX SODIUM 125 MG PO CSDR: 750.0000 mg | DELAYED_RELEASE_CAPSULE | Freq: Two times a day (BID) | ORAL | 3 refills | Status: AC

## 2021-04-04 MED ORDER — OLANZAPINE 10 MG PO TABS: ORAL_TABLET | ORAL | 0 refills | Status: DC

## 2021-04-04 MED ORDER — METOPROLOL TARTRATE 25 MG PO TABS: 12.5000 mg | ORAL_TABLET | Freq: Two times a day (BID) | ORAL | 5 refills | Status: DC

## 2021-04-04 NOTE — Progress Notes (Addendum)
[name] LUECKE Sr. , 1951-01-27, 70 y.o., male MRN: [id number] Patient Care Team    Relationship Specialty Notifications Start End  [name], DO PCP - General Family Medicine  11/08/17   Troy Sine, MD PCP - Cardiology Cardiology Admissions 02/17/19   [name], MD Consulting Physician Neurology  11/08/17   Clarene Essex, MD Consulting Physician Gastroenterology  11/29/17   Melissa Noon, [place] Referring Physician Optometry  06/30/20   Rod Can, MD Consulting Physician Orthopedic Surgery  06/30/20   Ashley Royalty, [place]  Physician Assistant  06/30/20   Cameron Sprang, MD Consulting Physician Neurology  12/16/20     Chief Complaint  Patient presents with   Hospitalization Follow-up    Pt wife mention 10 pound weight gain in 24 hours; would like DNR papers     Subjective:  [name] Flesher Sr.  is a 70 y.o. male presents for hospital follow up after recent admission on 03/14/2021 for primary diagnosis acute encephalopathy status post COVID.  Patient was discharged on 03/23/2021 to SNF. Patients discharge summary has been reviewed, as well as all labs/image studies obtained during hospitalization.   Patients hospital course: [name] was brought into the ED after he became increasingly confused at home.[place] Kitchen  He had been COVID-positive 3 days prior and started on oral antimedications.   Wife was becoming more concerned because he was not following her usual commands and was not communicating per his usual.  He does have dementia.  CT head was unremarkable for acute processes.  MRI head was also unremarkable.  Patient was admitted for possible COVID encephalopathy.  He was discharged to SNF on 03/23/2021. Since hospital discharge patient reports she feels he is actually doing better than he has in a while since being home.  He is eating and drinking well.  He is communicating well.  They would like to discuss DNR and MOST forms today.  No results for input(s): HGB, HCT, WBC, PLT in the  last 168 hours. CMP Latest Ref Rng & Units 03/22/2021 03/21/2021 03/19/2021  Glucose 70 - 99 mg/dL 100(H) 132(H) 101(H)  BUN 8 - 23 mg/dL 14 17 18   Creatinine 0.61 - 1.24 mg/dL 0.81 0.78 0.95  Sodium 135 - 145 mmol/L 141 138 141  Potassium 3.5 - 5.1 mmol/L 3.7 3.2(L) 3.6  Chloride 98 - 111 mmol/L 104 101 101  CO2 22 - 32 mmol/L 29 28 32  Calcium 8.9 - 10.3 mg/dL 8.9 8.8(L) 8.7(L)  Total Protein 6.5 - 8.1 g/dL - 5.8(L) 5.8(L)  Total Bilirubin 0.3 - 1.2 mg/dL - 1.5(H) 1.3(H)  Alkaline Phos 38 - 126 U/L - 42 39  AST 15 - 41 U/L - 20 19  ALT 0 - 44 U/L - 12 14      CT Head Wo Contrast  Result Date: 03/14/2021 CLINICAL DATA:  Mental status change. EXAM: CT HEAD WITHOUT CONTRAST TECHNIQUE: Contiguous axial images were obtained from the base of the skull through the vertex without intravenous contrast. COMPARISON:  March 10, 2021. FINDINGS: Brain: No evidence of acute large vascular territory infarction, hemorrhage, hydrocephalus, extra-axial collection or mass lesion/mass effect. Similar cerebral atrophy with ex vacuo ventricular dilation. Similar patchy white matter hypoattenuation, nonspecific but most likely related to chronic microvascular ischemic disease. Vascular: No hyperdense vessel identified. Calcific atherosclerosis. Skull: No acute fracture. Sinuses/Orbits: Visualized sinuses are clear. No acute orbital findings. Other: No mastoid effusions. IMPRESSION: 1. No evidence of acute intracranial abnormality. 2. Similar chronic  microvascular ischemic disease and atrophy. Electronically Signed   By: Margaretha Sheffield MD   On: 03/14/2021 12:16   CT Head Wo Contrast  Result Date: 03/12/2021 CLINICAL DATA:  Mental status change for unknown cause. History of advanced Alzheimer's. EXAM: CT HEAD WITHOUT CONTRAST TECHNIQUE: Contiguous axial images were obtained from the base of the skull through the vertex without intravenous contrast. COMPARISON:  01/10/2021 . FINDINGS: Brain: No evidence of acute  infarction, hemorrhage, hydrocephalus, extra-axial collection or mass lesion/mass effect. Diffuse cerebral atrophy. Ventricular dilatation likely due to central atrophy. Low-attenuation changes in the deep white matter likely due to small vessel ischemia. Vascular: Moderate intracranial arterial vascular calcifications. Skull: Calvarium appears intact. Sinuses/Orbits: Paranasal sinuses and mastoid air cells are clear. Other: None. IMPRESSION: No acute intracranial abnormalities. Chronic atrophy and small vessel ischemic changes. Electronically Signed   By: Lucienne Capers M.D.   On: 03/12/2021 21:04   MR BRAIN WO CONTRAST  Result Date: 03/14/2021 CLINICAL DATA:  Neuro deficit, acute, stroke suspected. Additional provided: Patient has become unresponsive. EXAM: MRI HEAD WITHOUT CONTRAST TECHNIQUE: Multiplanar, multiecho pulse sequences of the brain and surrounding structures were obtained without intravenous contrast. COMPARISON:  Prior head CT examinations 03/14/2021 and earlier. Brain MRI 02/18/2019. FINDINGS: Brain: The examination is intermittently motion degraded. Most notably, there is mild-to-moderate motion degradation of the axial T2 FLAIR sequence, and moderate motion degradation of the coronal T2 sequence. Moderate generalized cerebral atrophy. Comparatively mild cerebellar atrophy. Mild multifocal T2/FLAIR hyperintensity within the cerebral white matter, nonspecific but compatible with chronic small vessel ischemic disease. There is no acute infarct. No evidence of an intracranial mass. No chronic intracranial blood products. No extra-axial fluid collection. No midline shift. Vascular: Expected proximal arterial flow voids. Skull and upper cervical spine: No focal marrow lesion. Sinuses/Orbits: Visualized orbits show no acute finding. Trace bilateral ethmoid and sphenoid sinus mucosal thickening. Other: Trace fluid within the bilateral mastoid air cells. IMPRESSION: Intermittently motion degraded  examination, as described. No evidence of acute intracranial abnormality. Mild cerebral white matter chronic small vessel ischemic disease, slightly progressed from the brain MRI of 02/18/2019. Moderate generalized cerebral atrophy with comparatively mild cerebellar atrophy, stable. Trace fluid within the bilateral mastoid air cells. Electronically Signed   By: Kellie Simmering DO   On: 03/14/2021 20:16   DG Chest Portable 1 View  Result Date: 03/14/2021 CLINICAL DATA:  Altered mental status.  COVID-19 positive on Sunday. EXAM: PORTABLE CHEST 1 VIEW COMPARISON:  03/12/2021 FINDINGS: Midline trachea. Moderate cardiomegaly. Superior mediastinal soft tissue thickening is chronic and attributed to AP portable technique and low lung volumes. No pleural effusion or pneumothorax. No congestive failure. There may be mild left base atelectasis or scarring. No well-defined lobar consolidation. IMPRESSION: No acute cardiopulmonary disease. Cardiomegaly without congestive failure. Electronically Signed   By: Abigail Miyamoto M.D.   On: 03/14/2021 11:29   DG Chest Port 1 View  Result Date: 03/12/2021 CLINICAL DATA:  Urosepsis.  Altered mental status. EXAM: PORTABLE CHEST 1 VIEW COMPARISON:  01/10/2021 FINDINGS: Lordotic positioning noted. Heart size is stable lung for differences in positioning. Both lungs are clear. IMPRESSION: No active disease. Electronically Signed   By: Marlaine Hind M.D.   On: 03/12/2021 20:02   EEG adult  Result Date: 03/15/2021 Lora Havens, MD     03/15/2021  6:37 PM Patient Name: [name] [facility] Sr. MRN: [id number] Epilepsy Attending: Lora Havens Referring Physician/Provider: Dr Gean Birchwood Date: 03/15/2021 Duration: 22.50 mins Patient history: 70yo M with ams.  EEG to evaluate for seizure Level of alertness: Awake AEDs during EEG study: None Technical aspects: This EEG study was done with scalp electrodes positioned according to the 10-20 International system of electrode placement. Electrical  activity was acquired at a sampling rate of 500Hz  and reviewed with a high frequency filter of 70Hz  and a low frequency filter of 1Hz . EEG data were recorded continuously and digitally stored. Description: The posterior dominant rhythm consists of 7.5 Hz activity of moderate voltage (25-35 uV) seen predominantly in posterior head regions, symmetric and reactive to eye opening and eye closing. EEG showed continuous generalized 5 to 6 Hz theta as well as intermittent 2-3Hz  delta slowing. Hyperventilation and photic stimulation were not performed.   ABNORMALITY - Continuous slow, generalized IMPRESSION: This study is suggestive of moderate diffuse encephalopathy, nonspecific etiology. No seizures or epileptiform discharges were seen throughout the recording. Lora Havens     Depression screen [facility] 2/9 12/13/2020 06/30/2020 12/09/2019 05/26/2019 11/17/2018  Decreased Interest 1 3 0 0 0  Down, Depressed, Hopeless 0 3 1 1 1   PHQ - 2 Score 1 6 1 1 1   Altered sleeping - 3 0 1 1  Tired, decreased energy - 2 2 1 1   Change in appetite - 3 1 1  0  Feeling bad or failure about yourself  - 3 0 1 1  Trouble concentrating - 1 1 1  0  Moving slowly or fidgety/restless - 2 0 1 0  Suicidal thoughts - 1 0 0 0  PHQ-9 Score - 21 5 7 4   Difficult doing work/chores - - Not difficult at all Somewhat difficult Somewhat difficult    Allergies  Allergen Reactions   Losartan Potassium-Hctz Diarrhea   Nsaids Other (See Comments)    On eliquis.    Zoloft [Sertraline Hcl] Diarrhea   Benzodiazepines Other (See Comments)    Agitation.   Social History   Tobacco Use   Smoking status: Former    Packs/day: 1.00    Years: 19.00    Pack years: 19.00    Types: Cigarettes    Quit date: 02/25/1982    Years since quitting: 39.1   Smokeless tobacco: Former    Quit date: 1990  Substance Use Topics   Alcohol use: Yes    Comment: occ   Past Medical History:  Diagnosis Date   Acute delirium 07/14/2020   Adenomatous colon  polyp 2016   Allergy    AMS (altered mental status) 07/13/2020   Anxiety    Bell palsy    resolved   Bell's palsy 09/14/2020   CHF (congestive heart failure) (HCC)    Chicken pox    Concussion with no loss of consciousness 07/07/2018   Depression    Diet-controlled diabetes mellitus ([place])    Eating disorder    Essential hypertension 11/29/2017   Gastro-esophageal reflux disease without esophagitis 09/14/2020   GERD (gastroesophageal reflux disease)    History of vitamin D deficiency    Hyperlipidemia    Hypertension    Hypokalemia    Memory loss    MI (myocardial infarction) ([place])    per pt    Neoplasm of uncertain behavior of skin 09/14/2020   Personal history of colonic polyps 09/14/2020   Ulcerative colitis ([place]) 2005-2006   severe    Past Surgical History:  Procedure Laterality Date   COLONOSCOPY WITH PROPOFOL N/A 07/15/2014   Procedure: COLONOSCOPY WITH PROPOFOL;  Surgeon: [name], MD;  Location: [facility];  Service: Endoscopy;  Laterality: N/A;  COLONOSCOPY WITH PROPOFOL N/A 08/22/2015   Procedure: COLONOSCOPY WITH PROPOFOL;  Surgeon: [name], MD;  Location: [facility];  Service: Endoscopy;  Laterality: N/A;   ORIF ANKLE FRACTURE  02/27/2012   Procedure: OPEN REDUCTION INTERNAL FIXATION (ORIF) ANKLE FRACTURE;  Surgeon: Colin Rhein, MD;  Location: [facility];  Service: Orthopedics;  Laterality: Left;  ORIF left bimalleolar ankle fracture   SHOULDER SURGERY     LEFT   SIGMOIDOSCOPY  10/08/2018   Externa and Internal Hmorrhoids. Tubular and Tublovillous adenoma removed from transverse colon. Inflammatory pseudopolyps, negative for dysplasia   TONSILLECTOMY     UPPER GASTROINTESTINAL ENDOSCOPY     Family History  Problem Relation Age of Onset   Dementia Mother    Hypertension Mother    Early death Father 46       drowning    Post-traumatic stress disorder Brother    Neuropathy Brother    Allergies as of 04/04/2021       Reactions    Losartan Potassium-hctz Diarrhea   Nsaids Other (See Comments)   On eliquis.    Zoloft [sertraline Hcl] Diarrhea   Benzodiazepines Other (See Comments)   Agitation.        Medication List        Accurate as of April 04, 2021 11:51 AM. If you have any questions, ask your nurse or doctor.          acetaminophen 500 MG tablet Commonly known as: TYLENOL Take 500 mg by mouth every 6 (six) hours as needed for mild pain, headache or fever.   apixaban 5 MG Tabs tablet Commonly known as: ELIQUIS Take 1 tablet (5 mg total) by mouth 2 (two) times daily.   atorvastatin 20 MG tablet Commonly known as: LIPITOR Take 1 tablet (20 mg total) by mouth daily.   azaTHIOprine 50 MG tablet Commonly known as: IMURAN Take 200 mg by mouth every morning.   divalproex 125 MG capsule Commonly known as: DEPAKOTE SPRINKLE Take 6 capsules (750 mg total) by mouth every 12 (twelve) hours.   docusate sodium 100 MG capsule Commonly known as: Colace Take 1 capsule (100 mg total) by mouth 2 (two) times daily.   DULoxetine 60 MG capsule Commonly known as: CYMBALTA Take 1 capsule (60 mg total) by mouth daily.   furosemide 40 MG tablet Commonly known as: LASIX Take 1 tablet (40 mg total) by mouth 2 (two) times a week.   levocetirizine 5 MG tablet Commonly known as: XYZAL Take 5 mg by mouth every evening.   levothyroxine 50 MCG tablet Commonly known as: Synthroid Take 1 tablet (50 mcg total) by mouth daily before breakfast.   Melatonin 10 MG Tabs Take 10 mg by mouth at bedtime.   metoprolol tartrate 25 MG tablet Commonly known as: LOPRESSOR Take 0.5 tablets (12.5 mg total) by mouth 2 (two) times daily.   OLANZapine 10 MG tablet Commonly known as: ZYPREXA 1/2 tab in the morning and 1.5 tabs QHS   potassium chloride 10 MEQ tablet Commonly known as: Klor-Con M10 Take 1 tablet (10 mEq total) by mouth daily.   tamsulosin 0.4 MG Caps capsule Commonly known as: FLOMAX Take 1 capsule (0.4  mg total) by mouth daily.   traZODone 50 MG tablet Commonly known as: DESYREL TAKE 1 TABLET BY MOUTH EVERYDAY AT BEDTIME        All past medical history, surgical history, allergies, family history, immunizations and medications were updated in the EMR today and reviewed under  the history and medication portions of their EMR.      ROS: Negative, with the exception of above mentioned in HPI   Objective:  BP 108/64   Pulse (!) 105   Ht 5' 7"  (1.702 m)   Wt 221 lb (100.2 kg)   SpO2 98%   BMI 34.61 kg/m  Body mass index is 34.61 kg/m. Gen: Afebrile. No acute distress. Nontoxic in appearance, well developed, well nourished.  Pleasant male. HENT: AT. [place]. MMM Eyes:Pupils Equal Round Reactive to light, Extraocular movements intact,  Conjunctiva without redness, discharge or icterus. CV: RRR  Chest: CTAB, no wheeze or crackles. Good air movement, normal resp effort.  Skin: no rashes, purpura or petechiae.  Neuro: Normal gait. PERLA. EOMi. Alert. Oriented x1  Psych: Normal dress . Mildly slurred speech, sometimes nonsensical speech. He is happy today.  Assessment/Plan: [name] LESESNE Sr. is a 70 y.o. male present for OV for Hospital discharge follow up COVID-19/Encephalopathy/History of recent hospitalization Patient is recovering well.  He is back to his baseline.  He is doing very well today. He has appropriate follow-up scheduled. -Went over patient's medications in detail with wife.  Provided printout of medications and clarifications for her. DNR (do not resuscitate) discussion/DNR (do not resuscitate)/Dementia without behavioral disturbance, unspecified dementia type ([place]) Lengthy conversation today with patient and his wife. DNR forms signed today.  They were given a copy to take home.  Advanced care planning/counseling discussion MOST form completed today.  Copy will be uploaded into chart.  Reviewed expectations re: course of current medical issues. Discussed  self-management of symptoms. Outlined signs and symptoms indicating need for more acute intervention. Patient verbalized understanding and all questions were answered. Patient received an After-Visit Summary. Any changes in medications were reviewed and patient was provided with updated med list with their AVS.     No orders of the defined types were placed in this encounter.  > 45 Minutes was dedicated to this patient's encounter reviewing patient's hospitalization/SNF notes/events, labs, images, discussing the advance care planning/end-of-life planning, filling out DNR and MOST forms.   Note is dictated utilizing voice recognition software. Although note has been proof read prior to signing, occasional typographical errors still can be missed. If any questions arise, please do not hesitate to call for verification.   electronically signed by:  Howard Pouch, DO  [place]

## 2021-04-04 NOTE — Patient Instructions (Addendum)
Nice to see you today.   Make sure to keep the copies of the MOST and DNR forms handy.

## 2021-04-06 DIAGNOSIS — F0281 Dementia in other diseases classified elsewhere with behavioral disturbance: Secondary | ICD-10-CM | POA: Diagnosis not present

## 2021-04-06 DIAGNOSIS — K5190 Ulcerative colitis, unspecified, without complications: Secondary | ICD-10-CM | POA: Diagnosis not present

## 2021-04-06 DIAGNOSIS — I480 Paroxysmal atrial fibrillation: Secondary | ICD-10-CM | POA: Diagnosis not present

## 2021-04-06 DIAGNOSIS — E538 Deficiency of other specified B group vitamins: Secondary | ICD-10-CM | POA: Diagnosis not present

## 2021-04-06 DIAGNOSIS — I2510 Atherosclerotic heart disease of native coronary artery without angina pectoris: Secondary | ICD-10-CM | POA: Diagnosis not present

## 2021-04-06 DIAGNOSIS — I110 Hypertensive heart disease with heart failure: Secondary | ICD-10-CM | POA: Diagnosis not present

## 2021-04-06 DIAGNOSIS — G9341 Metabolic encephalopathy: Secondary | ICD-10-CM | POA: Diagnosis not present

## 2021-04-06 DIAGNOSIS — I5032 Chronic diastolic (congestive) heart failure: Secondary | ICD-10-CM | POA: Diagnosis not present

## 2021-04-06 DIAGNOSIS — N400 Enlarged prostate without lower urinary tract symptoms: Secondary | ICD-10-CM | POA: Diagnosis not present

## 2021-04-07 DIAGNOSIS — K5190 Ulcerative colitis, unspecified, without complications: Secondary | ICD-10-CM | POA: Diagnosis not present

## 2021-04-07 DIAGNOSIS — I480 Paroxysmal atrial fibrillation: Secondary | ICD-10-CM | POA: Diagnosis not present

## 2021-04-07 DIAGNOSIS — I110 Hypertensive heart disease with heart failure: Secondary | ICD-10-CM | POA: Diagnosis not present

## 2021-04-07 DIAGNOSIS — E538 Deficiency of other specified B group vitamins: Secondary | ICD-10-CM | POA: Diagnosis not present

## 2021-04-07 DIAGNOSIS — F0281 Dementia in other diseases classified elsewhere with behavioral disturbance: Secondary | ICD-10-CM | POA: Diagnosis not present

## 2021-04-07 DIAGNOSIS — I2510 Atherosclerotic heart disease of native coronary artery without angina pectoris: Secondary | ICD-10-CM | POA: Diagnosis not present

## 2021-04-07 DIAGNOSIS — I5032 Chronic diastolic (congestive) heart failure: Secondary | ICD-10-CM | POA: Diagnosis not present

## 2021-04-07 DIAGNOSIS — G9341 Metabolic encephalopathy: Secondary | ICD-10-CM | POA: Diagnosis not present

## 2021-04-07 DIAGNOSIS — N400 Enlarged prostate without lower urinary tract symptoms: Secondary | ICD-10-CM | POA: Diagnosis not present

## 2021-04-11 ENCOUNTER — Telehealth: Payer: Self-pay | Admitting: Family Medicine

## 2021-04-11 DIAGNOSIS — N400 Enlarged prostate without lower urinary tract symptoms: Secondary | ICD-10-CM | POA: Diagnosis not present

## 2021-04-11 DIAGNOSIS — E538 Deficiency of other specified B group vitamins: Secondary | ICD-10-CM | POA: Diagnosis not present

## 2021-04-11 DIAGNOSIS — G9341 Metabolic encephalopathy: Secondary | ICD-10-CM | POA: Diagnosis not present

## 2021-04-11 DIAGNOSIS — F0281 Dementia in other diseases classified elsewhere with behavioral disturbance: Secondary | ICD-10-CM | POA: Diagnosis not present

## 2021-04-11 DIAGNOSIS — I2510 Atherosclerotic heart disease of native coronary artery without angina pectoris: Secondary | ICD-10-CM | POA: Diagnosis not present

## 2021-04-11 DIAGNOSIS — I480 Paroxysmal atrial fibrillation: Secondary | ICD-10-CM | POA: Diagnosis not present

## 2021-04-11 DIAGNOSIS — K5190 Ulcerative colitis, unspecified, without complications: Secondary | ICD-10-CM | POA: Diagnosis not present

## 2021-04-11 DIAGNOSIS — I5032 Chronic diastolic (congestive) heart failure: Secondary | ICD-10-CM | POA: Diagnosis not present

## 2021-04-11 DIAGNOSIS — I110 Hypertensive heart disease with heart failure: Secondary | ICD-10-CM | POA: Diagnosis not present

## 2021-04-11 NOTE — Telephone Encounter (Signed)
Signed and returned to [place] work [employer]

## 2021-04-11 NOTE — Telephone Encounter (Signed)
[name] w/[facility] Ph# [phone]  [name] states wife has pt weigh every morning same time. Pt weighed 217 Sunday, 218 yesterday, 220 today.   Pt normally takes furesomide Thursday and Sunday. Should [name], wife, give him a dose today?  Please call [facility] Ph # [phone]

## 2021-04-11 NOTE — Telephone Encounter (Signed)
Received faxed orders from [facility]. Placed in Dr. Idelle Leech front office inbox to be signed and returned.

## 2021-04-11 NOTE — Telephone Encounter (Signed)
faxed

## 2021-04-11 NOTE — Telephone Encounter (Signed)
Home health orders received 04/11/21 for [employer]. Home health initiation orders: No. Home health re-certification orders: Yes. Patient last seen by ordering physician for this condition: yes. Must be less than 90 days for re-certification and less than 30 days prior for initiation. Visit must have been for the condition the orders are being placed. 04/04/21 Patient meets criteria for Physician to sign orders: Yes.       Current med list has been attached: No        Orders placed on physicians desk for signature: 04/11/21 (date) If patient does not meet criteria for orders to be signed: pt was called to schedule appt. Appt is scheduled for n/a.    Kavin Leech

## 2021-04-11 NOTE — Telephone Encounter (Signed)
Please advise 

## 2021-04-11 NOTE — Telephone Encounter (Signed)
She can give lasix today- then continue normal schedule thurs and Sunday

## 2021-04-12 ENCOUNTER — Telehealth: Payer: Self-pay | Admitting: Family Medicine

## 2021-04-12 DIAGNOSIS — I2510 Atherosclerotic heart disease of native coronary artery without angina pectoris: Secondary | ICD-10-CM | POA: Diagnosis not present

## 2021-04-12 DIAGNOSIS — F0281 Dementia in other diseases classified elsewhere with behavioral disturbance: Secondary | ICD-10-CM | POA: Diagnosis not present

## 2021-04-12 DIAGNOSIS — K5190 Ulcerative colitis, unspecified, without complications: Secondary | ICD-10-CM | POA: Diagnosis not present

## 2021-04-12 DIAGNOSIS — I5032 Chronic diastolic (congestive) heart failure: Secondary | ICD-10-CM | POA: Diagnosis not present

## 2021-04-12 DIAGNOSIS — I110 Hypertensive heart disease with heart failure: Secondary | ICD-10-CM | POA: Diagnosis not present

## 2021-04-12 DIAGNOSIS — G9341 Metabolic encephalopathy: Secondary | ICD-10-CM | POA: Diagnosis not present

## 2021-04-12 DIAGNOSIS — N400 Enlarged prostate without lower urinary tract symptoms: Secondary | ICD-10-CM | POA: Diagnosis not present

## 2021-04-12 DIAGNOSIS — E538 Deficiency of other specified B group vitamins: Secondary | ICD-10-CM | POA: Diagnosis not present

## 2021-04-12 DIAGNOSIS — I480 Paroxysmal atrial fibrillation: Secondary | ICD-10-CM | POA: Diagnosis not present

## 2021-04-12 NOTE — Telephone Encounter (Signed)
Received two faxed orders from [facility]. Placed both in Dr. Lucita Lora front office inbox to be signed and returned.

## 2021-04-12 NOTE — Telephone Encounter (Signed)
Pt wife made aware of providers instructions

## 2021-04-12 NOTE — Telephone Encounter (Signed)
Awaiting form

## 2021-04-13 DIAGNOSIS — E538 Deficiency of other specified B group vitamins: Secondary | ICD-10-CM | POA: Diagnosis not present

## 2021-04-13 DIAGNOSIS — F0281 Dementia in other diseases classified elsewhere with behavioral disturbance: Secondary | ICD-10-CM | POA: Diagnosis not present

## 2021-04-13 DIAGNOSIS — G9341 Metabolic encephalopathy: Secondary | ICD-10-CM | POA: Diagnosis not present

## 2021-04-13 DIAGNOSIS — N400 Enlarged prostate without lower urinary tract symptoms: Secondary | ICD-10-CM | POA: Diagnosis not present

## 2021-04-13 DIAGNOSIS — I2510 Atherosclerotic heart disease of native coronary artery without angina pectoris: Secondary | ICD-10-CM | POA: Diagnosis not present

## 2021-04-13 DIAGNOSIS — K5190 Ulcerative colitis, unspecified, without complications: Secondary | ICD-10-CM | POA: Diagnosis not present

## 2021-04-13 DIAGNOSIS — I5032 Chronic diastolic (congestive) heart failure: Secondary | ICD-10-CM | POA: Diagnosis not present

## 2021-04-13 DIAGNOSIS — I110 Hypertensive heart disease with heart failure: Secondary | ICD-10-CM | POA: Diagnosis not present

## 2021-04-13 DIAGNOSIS — I480 Paroxysmal atrial fibrillation: Secondary | ICD-10-CM | POA: Diagnosis not present

## 2021-04-13 NOTE — Telephone Encounter (Signed)
Home health orders received 04/11/21 for [employer]. Home health initiation orders: No. Home health re-certification orders: Yes. Patient last seen by ordering physician for this condition: yes. Must be less than 90 days for re-certification and less than 30 days prior for initiation. Visit must have been for the condition the orders are being placed. 04/04/21 Patient meets criteria for Physician to sign orders: Yes.       Current med list has been attached: No        Orders placed on physicians desk for signature: 04/11/21 (date) If patient does not meet criteria for orders to be signed: pt was called to schedule appt. Appt is scheduled for n/a.    Kavin Leech

## 2021-04-13 NOTE — Telephone Encounter (Signed)
Placed on PCP desk

## 2021-04-13 NOTE — Telephone Encounter (Signed)
Form faxed

## 2021-04-13 NOTE — Telephone Encounter (Signed)
Form received for review

## 2021-04-13 NOTE — Telephone Encounter (Signed)
Completed forms and placed in [place] work basket

## 2021-04-17 DIAGNOSIS — I5032 Chronic diastolic (congestive) heart failure: Secondary | ICD-10-CM | POA: Diagnosis not present

## 2021-04-17 DIAGNOSIS — M6281 Muscle weakness (generalized): Secondary | ICD-10-CM | POA: Diagnosis not present

## 2021-04-17 DIAGNOSIS — R2681 Unsteadiness on feet: Secondary | ICD-10-CM | POA: Diagnosis not present

## 2021-04-18 ENCOUNTER — Telehealth: Payer: Self-pay

## 2021-04-18 ENCOUNTER — Telehealth: Payer: Self-pay | Admitting: Physician Assistant

## 2021-04-18 DIAGNOSIS — E538 Deficiency of other specified B group vitamins: Secondary | ICD-10-CM | POA: Diagnosis not present

## 2021-04-18 DIAGNOSIS — I480 Paroxysmal atrial fibrillation: Secondary | ICD-10-CM | POA: Diagnosis not present

## 2021-04-18 DIAGNOSIS — G9341 Metabolic encephalopathy: Secondary | ICD-10-CM | POA: Diagnosis not present

## 2021-04-18 DIAGNOSIS — I5032 Chronic diastolic (congestive) heart failure: Secondary | ICD-10-CM | POA: Diagnosis not present

## 2021-04-18 DIAGNOSIS — I2510 Atherosclerotic heart disease of native coronary artery without angina pectoris: Secondary | ICD-10-CM | POA: Diagnosis not present

## 2021-04-18 DIAGNOSIS — N400 Enlarged prostate without lower urinary tract symptoms: Secondary | ICD-10-CM | POA: Diagnosis not present

## 2021-04-18 DIAGNOSIS — F0281 Dementia in other diseases classified elsewhere with behavioral disturbance: Secondary | ICD-10-CM | POA: Diagnosis not present

## 2021-04-18 DIAGNOSIS — K5190 Ulcerative colitis, unspecified, without complications: Secondary | ICD-10-CM | POA: Diagnosis not present

## 2021-04-18 DIAGNOSIS — I110 Hypertensive heart disease with heart failure: Secondary | ICD-10-CM | POA: Diagnosis not present

## 2021-04-18 NOTE — Telephone Encounter (Signed)
FYI

## 2021-04-18 NOTE — Telephone Encounter (Signed)
[name], [place] Nurse calling about patient.  Patient has fallen 2 times in the last few days. Patient seems to have lost his balance.  Once on 8/6 and again on 8/8.  Nurse reports patient does not have injuries, patient is not complaining of pain.   Hilliard Clark can be reached at [phone]

## 2021-04-18 NOTE — Telephone Encounter (Signed)
Noted  

## 2021-04-18 NOTE — Telephone Encounter (Signed)
Pt's wife called in stating the patient is showing signs of parkinsons. He has has tremors, his hands are cold all the time, fell in the yard a few days ago. She is wondering if he should be seen sooner than October or if he should start some medication?

## 2021-04-19 ENCOUNTER — Telehealth: Payer: Self-pay | Admitting: Family Medicine

## 2021-04-19 DIAGNOSIS — I5032 Chronic diastolic (congestive) heart failure: Secondary | ICD-10-CM | POA: Diagnosis not present

## 2021-04-19 DIAGNOSIS — K5190 Ulcerative colitis, unspecified, without complications: Secondary | ICD-10-CM | POA: Diagnosis not present

## 2021-04-19 DIAGNOSIS — E538 Deficiency of other specified B group vitamins: Secondary | ICD-10-CM | POA: Diagnosis not present

## 2021-04-19 DIAGNOSIS — I2510 Atherosclerotic heart disease of native coronary artery without angina pectoris: Secondary | ICD-10-CM | POA: Diagnosis not present

## 2021-04-19 DIAGNOSIS — N400 Enlarged prostate without lower urinary tract symptoms: Secondary | ICD-10-CM | POA: Diagnosis not present

## 2021-04-19 DIAGNOSIS — I480 Paroxysmal atrial fibrillation: Secondary | ICD-10-CM | POA: Diagnosis not present

## 2021-04-19 DIAGNOSIS — F0281 Dementia in other diseases classified elsewhere with behavioral disturbance: Secondary | ICD-10-CM | POA: Diagnosis not present

## 2021-04-19 DIAGNOSIS — I110 Hypertensive heart disease with heart failure: Secondary | ICD-10-CM | POA: Diagnosis not present

## 2021-04-19 DIAGNOSIS — G9341 Metabolic encephalopathy: Secondary | ICD-10-CM | POA: Diagnosis not present

## 2021-04-19 NOTE — Telephone Encounter (Signed)
[place] for earlier appointment with Clarise Cruz. Thanks

## 2021-04-19 NOTE — Telephone Encounter (Signed)
Michelle with [facility] called to let Dr. [name] know the patient has a 5lb weight gain in the last three days and he has been taking his water pills. Patients BP was slightly elevated from normal, 156/90 then 142/88 (his are usually 110s/60s). Also, he has been eating more lately.  Sharyn Lull would like to know if he is supposed to be on a fluid restriction?  Michelle's callback number is [phone].

## 2021-04-19 NOTE — Telephone Encounter (Signed)
LVM for Sharyn Lull to [facility] regarding fluid instructions.

## 2021-04-19 NOTE — Telephone Encounter (Signed)
Please advise 

## 2021-04-19 NOTE — Telephone Encounter (Signed)
Patient is not on fluid restriction.   Patient's wife was given instructions during that visit, on when to increase his Lasix if he has fluid retention.

## 2021-04-20 ENCOUNTER — Telehealth: Payer: Self-pay | Admitting: Cardiovascular Disease

## 2021-04-20 DIAGNOSIS — I110 Hypertensive heart disease with heart failure: Secondary | ICD-10-CM | POA: Diagnosis not present

## 2021-04-20 DIAGNOSIS — F0281 Dementia in other diseases classified elsewhere with behavioral disturbance: Secondary | ICD-10-CM | POA: Diagnosis not present

## 2021-04-20 DIAGNOSIS — I480 Paroxysmal atrial fibrillation: Secondary | ICD-10-CM | POA: Diagnosis not present

## 2021-04-20 DIAGNOSIS — G9341 Metabolic encephalopathy: Secondary | ICD-10-CM | POA: Diagnosis not present

## 2021-04-20 DIAGNOSIS — K5190 Ulcerative colitis, unspecified, without complications: Secondary | ICD-10-CM | POA: Diagnosis not present

## 2021-04-20 DIAGNOSIS — I2510 Atherosclerotic heart disease of native coronary artery without angina pectoris: Secondary | ICD-10-CM | POA: Diagnosis not present

## 2021-04-20 DIAGNOSIS — I5032 Chronic diastolic (congestive) heart failure: Secondary | ICD-10-CM | POA: Diagnosis not present

## 2021-04-20 DIAGNOSIS — E538 Deficiency of other specified B group vitamins: Secondary | ICD-10-CM | POA: Diagnosis not present

## 2021-04-20 DIAGNOSIS — N400 Enlarged prostate without lower urinary tract symptoms: Secondary | ICD-10-CM | POA: Diagnosis not present

## 2021-04-20 NOTE — Telephone Encounter (Signed)
Pt c/o swelling: STAT is pt has developed SOB within 24 hours  How much weight have you gained and in what time span? 5 lbs since sunday  If swelling, where is the swelling located? Bilateral feet/legs  Are you currently taking a fluid pill? Yes, pt has been taking 2 extra fluid pills per day  Are you currently SOB? No  Do you have a log of your daily weights (if so, list)?  04/16/21 -219.5 04/17/21- 221.4 04/18/21 -224.2 04/19/21 -225.0- no fluid pill on this day 04/20/21 -225.4  Have you gained 3 pounds in a day or 5 pounds in a week? 5lbs since Sunday  Have you traveled recently? No  Pt has alzheimer's/Dementia so please contact wife. Pt's feet and hands stay cold all the time and he's always freezing

## 2021-04-20 NOTE — Telephone Encounter (Signed)
Spoke with [name] she reports that the pt has gained 5 pounds since Sunday and has been taking the lasix everyday this week. I consulted with Dr. [name] and informed [name] to have pt wife to contact cardiology to schedule or give further instructions. No SOB noted.

## 2021-04-20 NOTE — Telephone Encounter (Signed)
Left a message for the patient to call back.  

## 2021-04-21 ENCOUNTER — Telehealth: Payer: Self-pay | Admitting: Family Medicine

## 2021-04-21 DIAGNOSIS — F339 Major depressive disorder, recurrent, unspecified: Secondary | ICD-10-CM | POA: Diagnosis not present

## 2021-04-21 NOTE — Telephone Encounter (Signed)
Clarification:  Orders are v/o please advise if okay to give verbally

## 2021-04-21 NOTE — Telephone Encounter (Signed)
Completed and placed on [facility] work basket

## 2021-04-21 NOTE — Telephone Encounter (Signed)
Sean with [facility] called to request verbal orders as follows: To continue care starting 04/25/21 Twice weekly x 2 weeks then Once weekly x 4 weeks. Seans's callback [phone], which is a cell and you can leave a voicemail.

## 2021-04-21 NOTE — Telephone Encounter (Signed)
pt returning call please advise.

## 2021-04-21 NOTE — Telephone Encounter (Signed)
Placed on PCP desk to review and sign, if appropriate.  

## 2021-04-21 NOTE — Telephone Encounter (Signed)
   Pt's wife retuning call

## 2021-04-21 NOTE — Telephone Encounter (Signed)
Attempted to contact patient wife back. Number was busy at this time. Will attempt to call back later.

## 2021-04-21 NOTE — Telephone Encounter (Signed)
approved

## 2021-04-21 NOTE — Telephone Encounter (Signed)
Faxed signed OT orders to [facility]. Fax confirmation received.

## 2021-04-22 ENCOUNTER — Telehealth: Payer: Self-pay | Admitting: Student

## 2021-04-22 NOTE — Telephone Encounter (Signed)
   Patient's wife called the after-hours line and reported his weight has increased from 219 lbs from last Sunday to 228 lbs today. He has been taking Lasix 78m intermittently this week with no improvement in his weight or edema. Spoke with his HRussian Missionwho was present as well and he has not been consuming excess salt but does consume over 1 gallon of fluids daily. I recommended they limit his fluid intake to < 2 L if possible but realize this will be challenging given his dementia. I did recommend he take Lasix 41mBID today and possibly again tomorrow if no improvement. We reviewed reasons to seek emergent evaluation in the interim, especially if he develops any worsening dyspnea on exertion or orthopnea. They voiced understanding of this and were appreciative of the return call.  I will send a message to the office to see if they can reach out to follow-up on Monday and hopefully arrange for an in-office visit this week as his last Cardiology visit was in 04/2020.   Signed, BrErma HeritagePA-C 04/22/2021, 9:41 AM

## 2021-04-24 ENCOUNTER — Other Ambulatory Visit: Payer: Self-pay

## 2021-04-24 ENCOUNTER — Ambulatory Visit (INDEPENDENT_AMBULATORY_CARE_PROVIDER_SITE_OTHER): Payer: Medicare HMO | Admitting: Physician Assistant

## 2021-04-24 ENCOUNTER — Encounter: Payer: Self-pay | Admitting: Physician Assistant

## 2021-04-24 DIAGNOSIS — I110 Hypertensive heart disease with heart failure: Secondary | ICD-10-CM | POA: Diagnosis not present

## 2021-04-24 DIAGNOSIS — G912 (Idiopathic) normal pressure hydrocephalus: Secondary | ICD-10-CM

## 2021-04-24 DIAGNOSIS — G301 Alzheimer's disease with late onset: Secondary | ICD-10-CM

## 2021-04-24 DIAGNOSIS — I5032 Chronic diastolic (congestive) heart failure: Secondary | ICD-10-CM | POA: Diagnosis not present

## 2021-04-24 DIAGNOSIS — F02818 Dementia in other diseases classified elsewhere, unspecified severity, with other behavioral disturbance: Secondary | ICD-10-CM

## 2021-04-24 DIAGNOSIS — F0281 Dementia in other diseases classified elsewhere with behavioral disturbance: Secondary | ICD-10-CM

## 2021-04-24 DIAGNOSIS — G20 Parkinson's disease: Secondary | ICD-10-CM

## 2021-04-24 DIAGNOSIS — G9341 Metabolic encephalopathy: Secondary | ICD-10-CM | POA: Diagnosis not present

## 2021-04-24 DIAGNOSIS — I480 Paroxysmal atrial fibrillation: Secondary | ICD-10-CM | POA: Diagnosis not present

## 2021-04-24 DIAGNOSIS — R251 Tremor, unspecified: Secondary | ICD-10-CM | POA: Diagnosis not present

## 2021-04-24 DIAGNOSIS — K5190 Ulcerative colitis, unspecified, without complications: Secondary | ICD-10-CM | POA: Diagnosis not present

## 2021-04-24 DIAGNOSIS — N400 Enlarged prostate without lower urinary tract symptoms: Secondary | ICD-10-CM | POA: Diagnosis not present

## 2021-04-24 DIAGNOSIS — I2510 Atherosclerotic heart disease of native coronary artery without angina pectoris: Secondary | ICD-10-CM | POA: Diagnosis not present

## 2021-04-24 DIAGNOSIS — E538 Deficiency of other specified B group vitamins: Secondary | ICD-10-CM | POA: Diagnosis not present

## 2021-04-24 MED ORDER — CARBIDOPA-LEVODOPA 25-100 MG PO TABS: 1.0000 | ORAL_TABLET | Freq: Three times a day (TID) | ORAL | 2 refills | Status: DC

## 2021-04-24 NOTE — Telephone Encounter (Signed)
Patient was called back today 04/24/2021, via another message, LVM. See previous note.
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
## 2021-04-24 NOTE — Telephone Encounter (Signed)
Left message to call back  

## 2021-04-24 NOTE — Patient Instructions (Addendum)
It was a pleasure to see you today at our office.   Recommendations:  Meds: Follow up in October with Dr. Delice Lesch DaT scan to determine if theres is Parkinson's disease MRI of the brain to check the brain inside  Start Sinemet 25/100 mg 3 times a day. Take it with an empty stomach . Monitor for side effects   RECOMMENDATIONS FOR ALL PATIENTS WITH MEMORY PROBLEMS: 1. Continue to exercise (Recommend 30 minutes of walking everyday, or 3 hours every week) 2. Increase social interactions - continue going to [place] and enjoy social gatherings with friends and family 3. Eat healthy, avoid fried foods and eat more fruits and vegetables 4. Maintain adequate blood pressure, blood sugar, and blood cholesterol level. Reducing the risk of stroke and cardiovascular disease also helps promoting better memory. 5. Avoid stressful situations. Live a simple life and avoid aggravations. Organize your time and prepare for the next day in anticipation. 6. Sleep well, avoid any interruptions of sleep and avoid any distractions in the bedroom that may interfere with adequate sleep quality 7. Avoid sugar, avoid sweets as there is a strong link between excessive sugar intake, diabetes, and cognitive impairment We discussed the Mediterranean diet, which has been shown to help patients reduce the risk of progressive memory disorders and reduces cardiovascular risk. This includes eating fish, eat fruits and green leafy vegetables, nuts like almonds and hazelnuts, walnuts, and also use olive oil. Avoid fast foods and fried foods as much as possible. Avoid sweets and sugar as sugar use has been linked to worsening of memory function.  There is always a concern of gradual progression of memory problems. If this is the case, then we may need to adjust level of care according to patient needs. Support, both to the patient and caregiver, should then be put into place.    The [employer] Association is here all day, every day for  people facing Alzheimer's disease through our free 24/7 Helpline: [phone]. The Helpline provides reliable information and support to all those who need assistance, such as individuals living with memory loss, Alzheimer's or other dementia, caregivers, health care professionals and the public.  Our highly trained and knowledgeable staff can help you with: Understanding memory loss, dementia and Alzheimer's  Medications and other treatment options  General information about aging and brain health  Skills to provide quality care and to find the best care from professionals  Legal, financial and living-arrangement decisions Our Helpline also features: Confidential care consultation provided by master's level clinicians who can help with decision-making support, crisis assistance and education on issues families face every day  Help in a caller's preferred language using our translation service that features more than 200 languages and dialects  Referrals to local community programs, services and ongoing support     FALL PRECAUTIONS: Be cautious when walking. Scan the area for obstacles that may increase the risk of trips and falls. When getting up in the mornings, sit up at the edge of the bed for a few minutes before getting out of bed. Consider elevating the bed at the head end to avoid drop of blood pressure when getting up. Walk always in a well-lit room (use night lights in the walls). Avoid area rugs or power cords from appliances in the middle of the walkways. Use a walker or a cane if necessary and consider physical therapy for balance exercise. Get your eyesight checked regularly.  FINANCIAL OVERSIGHT: Supervision, especially oversight when making financial decisions or transactions is also  recommended.  HOME SAFETY: Consider the safety of the kitchen when operating appliances like stoves, microwave oven, and blender. Consider having supervision and share cooking responsibilities until  no longer able to participate in those. Accidents with firearms and other hazards in the house should be identified and addressed as well.   ABILITY TO BE LEFT ALONE: If patient is unable to contact 911 operator, consider using LifeLine, or when the need is there, arrange for someone to stay with patients. Smoking is a fire hazard, consider supervision or cessation. Risk of wandering should be assessed by caregiver and if detected at any point, supervision and safe proof recommendations should be instituted.  MEDICATION SUPERVISION: Inability to self-administer medication needs to be constantly addressed. Implement a mechanism to ensure safe administration of the medications.   DRIVING: Regarding driving, in patients with progressive memory problems, driving will be impaired. We advise to have someone else do the driving if trouble finding directions or if minor accidents are reported. Independent driving assessment is available to determine safety of driving.   If you are interested in the driving assessment, you can contact the following:  [employer] in [place]  [place] [phone] or [phone]      [place] refers to food and lifestyle choices that are based on the traditions of countries located on the [employer]. This way of eating has been shown to help prevent certain conditions and improve outcomes for people who have chronic diseases, like kidney disease and heart disease. What are tips for following this plan? Lifestyle  Cook and eat meals together with your family, when possible. Drink enough fluid to keep your urine clear or pale yellow. Be physically active every day. This includes: Aerobic exercise like running or swimming. Leisure activities like gardening, walking, or housework. Get 7-8 hours of sleep each  night. If recommended by your health care provider, drink red wine in moderation. This means 1 glass a day for nonpregnant women and 2 glasses a day for men. A glass of wine equals 5 oz (150 mL). Reading food labels  Check the serving size of packaged foods. For foods such as rice and pasta, the serving size refers to the amount of cooked product, not dry. Check the total fat in packaged foods. Avoid foods that have saturated fat or trans fats. Check the ingredients list for added sugars, such as corn syrup. Shopping  At the grocery store, buy most of your food from the areas near the walls of the store. This includes: Fresh fruits and vegetables (produce). Grains, beans, nuts, and seeds. Some of these may be available in unpackaged forms or large amounts (in bulk). Fresh seafood. Poultry and eggs. Low-fat dairy products. Buy whole ingredients instead of prepackaged foods. Buy fresh fruits and vegetables in-season from local farmers markets. Buy frozen fruits and vegetables in resealable bags. If you do not have access to quality fresh seafood, buy precooked frozen shrimp or canned fish, such as tuna, salmon, or sardines. Buy small amounts of raw or cooked vegetables, salads, or olives from the deli or salad bar at your store. Stock your pantry so you always have certain foods on hand, such as olive oil, canned tuna, canned tomatoes, rice, pasta, and beans. Cooking  Cook foods with extra-virgin olive oil instead of using butter or other vegetable oils. Have meat as a side dish, and have vegetables or grains as your  main dish. This means having meat in small portions or adding small amounts of meat to foods like pasta or stew. Use beans or vegetables instead of meat in common dishes like chili or lasagna. Experiment with different cooking methods. Try roasting or broiling vegetables instead of steaming or sauteing them. Add frozen vegetables to soups, stews, pasta, or rice. Add nuts or seeds  for added healthy fat at each meal. You can add these to yogurt, salads, or vegetable dishes. Marinate fish or vegetables using olive oil, lemon juice, garlic, and fresh herbs. Meal planning  Plan to eat 1 vegetarian meal one day each week. Try to work up to 2 vegetarian meals, if possible. Eat seafood 2 or more times a week. Have healthy snacks readily available, such as: Vegetable sticks with hummus. Greek yogurt. Fruit and nut trail mix. Eat balanced meals throughout the week. This includes: Fruit: 2-3 servings a day Vegetables: 4-5 servings a day Low-fat dairy: 2 servings a day Fish, poultry, or lean meat: 1 serving a day Beans and legumes: 2 or more servings a week Nuts and seeds: 1-2 servings a day Whole grains: 6-8 servings a day Extra-virgin olive oil: 3-4 servings a day Limit red meat and sweets to only a few servings a month What are my food choices? Mediterranean diet Recommended Grains: Whole-grain pasta. Brown rice. Bulgar wheat. Polenta. Couscous. Whole-wheat bread. [place]. Vegetables: Artichokes. Beets. Broccoli. Cabbage. Carrots. Eggplant. Green beans. Chard. Kale. Spinach. Onions. Leeks. Peas. Squash. Tomatoes. Peppers. Radishes. Fruits: Apples. Apricots. Avocado. Berries. Bananas. Cherries. Dates. Figs. Grapes. Lemons. Melon. Oranges. Peaches. Plums. Pomegranate. Meats and other protein foods: Beans. Almonds. Sunflower seeds. Pine nuts. Peanuts. [place]. Salmon. Scallops. Shrimp. [place]. Tilapia. Clams. Oysters. Eggs. Dairy: Low-fat milk. Cheese. Greek yogurt. Beverages: Water. Red wine. Herbal tea. Fats and oils: Extra virgin olive oil. Avocado oil. Grape seed oil. Sweets and desserts: [place] yogurt with honey. Baked apples. Poached pears. Trail mix. Seasoning and other foods: Basil. Cilantro. Coriander. Cumin. Mint. Parsley. Sage. Rosemary. Tarragon. Garlic. Oregano. Thyme. Pepper. Balsalmic vinegar. Tahini. Hummus. Tomato sauce. Olives. Mushrooms. Limit  these Grains: Prepackaged pasta or rice dishes. Prepackaged cereal with added sugar. Vegetables: Deep fried potatoes (french fries). Fruits: Fruit canned in syrup. Meats and other protein foods: Beef. Pork. Lamb. Poultry with skin. Hot dogs. [place] Sal[place]. Dairy: Ice cream. Sour cream. Whole milk. Beverages: Juice. Sugar-sweetened soft drinks. Beer. Liquor and spirits. Fats and oils: Butter. Canola oil. Vegetable oil. Beef fat (tallow). Lard. Sweets and desserts: Cookies. Cakes. Pies. Candy. Seasoning and other foods: Mayonnaise. Premade sauces and marinades. The items listed may not be a complete list. Talk with your dietitian about what dietary choices are right for you. Summary The Mediterranean diet includes both food and lifestyle choices. Eat a variety of fresh fruits and vegetables, beans, nuts, seeds, and whole grains. Limit the amount of red meat and sweets that you eat. Talk with your health care provider about whether it is safe for you to drink red wine in moderation. This means 1 glass a day for nonpregnant women and 2 glasses a day for men. A glass of wine equals 5 oz (150 mL). This information is not intended to replace advice given to you by your health care provider. Make sure you discuss any questions you have with your health care provider. Document Released: 04/19/2016 Document Revised: 05/22/2016 Document Reviewed: 04/19/2016 Elsevier Interactive Patient Education  2017 [employer].

## 2021-04-24 NOTE — Telephone Encounter (Signed)
Noted  

## 2021-04-24 NOTE — Telephone Encounter (Signed)
Sean notified

## 2021-04-25 ENCOUNTER — Telehealth: Payer: Self-pay | Admitting: Family Medicine

## 2021-04-25 DIAGNOSIS — F0281 Dementia in other diseases classified elsewhere with behavioral disturbance: Secondary | ICD-10-CM | POA: Diagnosis not present

## 2021-04-25 DIAGNOSIS — G309 Alzheimer's disease, unspecified: Secondary | ICD-10-CM | POA: Diagnosis not present

## 2021-04-25 DIAGNOSIS — I480 Paroxysmal atrial fibrillation: Secondary | ICD-10-CM | POA: Diagnosis not present

## 2021-04-25 DIAGNOSIS — I110 Hypertensive heart disease with heart failure: Secondary | ICD-10-CM | POA: Diagnosis not present

## 2021-04-25 DIAGNOSIS — K5190 Ulcerative colitis, unspecified, without complications: Secondary | ICD-10-CM | POA: Diagnosis not present

## 2021-04-25 DIAGNOSIS — N400 Enlarged prostate without lower urinary tract symptoms: Secondary | ICD-10-CM | POA: Diagnosis not present

## 2021-04-25 DIAGNOSIS — I5032 Chronic diastolic (congestive) heart failure: Secondary | ICD-10-CM | POA: Diagnosis not present

## 2021-04-25 DIAGNOSIS — F05 Delirium due to known physiological condition: Secondary | ICD-10-CM | POA: Diagnosis not present

## 2021-04-25 DIAGNOSIS — I2510 Atherosclerotic heart disease of native coronary artery without angina pectoris: Secondary | ICD-10-CM | POA: Diagnosis not present

## 2021-04-25 NOTE — Telephone Encounter (Signed)
[name] with [name] called to make Dr. [name] aware of this patient with CHF and his weight today was up 4lb from yesterday. Caregiver gave patient extra dose of furosemide today.

## 2021-04-25 NOTE — Telephone Encounter (Signed)
FYI

## 2021-04-26 ENCOUNTER — Telehealth: Payer: Self-pay

## 2021-04-26 NOTE — Telephone Encounter (Signed)
Please advise on v/o

## 2021-04-26 NOTE — Telephone Encounter (Signed)
Spoke with patient's wife and advised cardiology PA recommended an appointment Scheduled OV with Dr. Claiborne Billings on 05/01/21 @ 3pm Advised to bring a log of daily weights

## 2021-04-26 NOTE — Telephone Encounter (Signed)
[name] with [facility] called to request verbal orders as follows: He has completed recertification period and is requesting to  For orders  Once weekly x 4 weeks.  [name] can be reached at [phone]. This is her personal cell phone, may leave a message/

## 2021-04-27 DIAGNOSIS — I480 Paroxysmal atrial fibrillation: Secondary | ICD-10-CM | POA: Diagnosis not present

## 2021-04-27 DIAGNOSIS — N400 Enlarged prostate without lower urinary tract symptoms: Secondary | ICD-10-CM | POA: Diagnosis not present

## 2021-04-27 DIAGNOSIS — F0281 Dementia in other diseases classified elsewhere with behavioral disturbance: Secondary | ICD-10-CM | POA: Diagnosis not present

## 2021-04-27 DIAGNOSIS — I5032 Chronic diastolic (congestive) heart failure: Secondary | ICD-10-CM | POA: Diagnosis not present

## 2021-04-27 DIAGNOSIS — F05 Delirium due to known physiological condition: Secondary | ICD-10-CM | POA: Diagnosis not present

## 2021-04-27 DIAGNOSIS — I110 Hypertensive heart disease with heart failure: Secondary | ICD-10-CM | POA: Diagnosis not present

## 2021-04-27 DIAGNOSIS — G309 Alzheimer's disease, unspecified: Secondary | ICD-10-CM | POA: Diagnosis not present

## 2021-04-27 DIAGNOSIS — I2510 Atherosclerotic heart disease of native coronary artery without angina pectoris: Secondary | ICD-10-CM | POA: Diagnosis not present

## 2021-04-27 DIAGNOSIS — K5190 Ulcerative colitis, unspecified, without complications: Secondary | ICD-10-CM | POA: Diagnosis not present

## 2021-04-30 ENCOUNTER — Other Ambulatory Visit: Payer: Self-pay | Admitting: Family Medicine

## 2021-05-01 ENCOUNTER — Ambulatory Visit (INDEPENDENT_AMBULATORY_CARE_PROVIDER_SITE_OTHER): Payer: Medicare HMO | Admitting: Cardiovascular Disease

## 2021-05-01 ENCOUNTER — Other Ambulatory Visit: Payer: Self-pay

## 2021-05-01 ENCOUNTER — Encounter: Payer: Self-pay | Admitting: Cardiovascular Disease

## 2021-05-01 VITALS — BP 96/58 | HR 102 | Ht 67.0 in | Wt 230.0 lb

## 2021-05-01 DIAGNOSIS — I6529 Occlusion and stenosis of unspecified carotid artery: Secondary | ICD-10-CM | POA: Diagnosis not present

## 2021-05-01 DIAGNOSIS — I700 Atherosclerosis of aorta: Secondary | ICD-10-CM | POA: Diagnosis not present

## 2021-05-01 DIAGNOSIS — I480 Paroxysmal atrial fibrillation: Secondary | ICD-10-CM

## 2021-05-01 DIAGNOSIS — G4733 Obstructive sleep apnea (adult) (pediatric): Secondary | ICD-10-CM | POA: Diagnosis not present

## 2021-05-01 DIAGNOSIS — M7989 Other specified soft tissue disorders: Secondary | ICD-10-CM

## 2021-05-01 DIAGNOSIS — E785 Hyperlipidemia, unspecified: Secondary | ICD-10-CM | POA: Diagnosis not present

## 2021-05-01 DIAGNOSIS — I951 Orthostatic hypotension: Secondary | ICD-10-CM

## 2021-05-01 DIAGNOSIS — Z7901 Long term (current) use of anticoagulants: Secondary | ICD-10-CM | POA: Diagnosis not present

## 2021-05-01 DIAGNOSIS — E039 Hypothyroidism, unspecified: Secondary | ICD-10-CM

## 2021-05-01 DIAGNOSIS — G20 Parkinson's disease: Secondary | ICD-10-CM

## 2021-05-01 MED ORDER — METOPROLOL TARTRATE 25 MG PO TABS: ORAL_TABLET | ORAL | 3 refills | Status: DC

## 2021-05-01 NOTE — Telephone Encounter (Signed)
V/o given

## 2021-05-01 NOTE — Progress Notes (Signed)
Cardiology Office Note    Date:  05/03/2021   ID:  [name] TURNBOUGH Sr., DOB Jul 06, 1951, MRN [id number]  PCP:  [name], DO  Cardiologist:  Shelva Majestic, MD   One year F/U  office evaluation   History of Present Illness:  [name] ALCINDOR Sr. is a 70 y.o. male who presents for a one year follow-up evaluation.    [name] has a history of anxiety and depression, GERD, obesity, untreated obstructive sleep apnea, diabetes mellitus, hypertension, hyperlipidemia, hypothyroidism, benzodiazepine dependence as well as memory loss.  He has  a history of atrial fibrillation with RVR and has been maintaining sinus rhythm on anticoagulation therapy.  He was admitted to the hospital in June 2020 which time he was found to be in atrial fibrillation with RVR.  An echo Doppler study showed an EF greater than 65%, moderate concentric LVH, grade 2 diastolic dysfunction, elevated left atrial/left ventricular end-diastolic pressure and mild mitral annular calcification.  At that time he was started on metoprolol tartrate for rate control and Eliquis 5 mg twice daily for stroke prophylaxis given his CHA2DS2-VASc score of 3.  He was also started on levothyroxine for an elevated TSH level.  A  Lexiscan nuclear perfusion study performed on May 08, 2019 was low risk and probably normal with mild thinning of the distal anteroseptal wall and apex without associated ischemia.  EF was 54% with normal wall motion.  He has a history of ulcerative colitis.  When I saw him in August 2021 he was to undergo colonoscopy procedure and was referred by Dr. Driscilla Grammes for preoperative clearance.  At that time, he denied any chest pain, PND orthopnea.  He was unaware of any recurrent breakthrough atrial fibrillation.  He has a history of obstructive sleep apnea and has been followed by  [facility] neurology with Dr. Rexene Alberts.  Apparently a home study confirmed severe obstructive sleep apnea with CPAP was recommended.  Due to nonuse, his CPAP  was ultimately returned.  He has not been on therapy.  His sleep is poor.  He admits to daytime sleepiness.  According to his wife he snores.  He tells me since I last saw him he had an episode of atrial fibrillation in October 2021 and was at [facility].  He developed COVID in July 2022.  He has had issues with progressive swelling with weight increased from 2 18-2 30.  He also has been diagnosed with Parkinson's disease and was recently started on carbidopa levodopa 25/100 mg tablets 3 times a day. He has dementia, tremors, and poor bladder control.  He currently is on Cymbalta for anxiety and depression.  He continues to be on anticoagulation with apixaban and is on metoprolol tartrate 12.5 mg twice a day and has been taking furosemide 40 mg twice a day over this past weekend.  He has ulcerative colitis and is on azathioprine.  He continues to be on atorvastatin 20 mg for hyperlipidemia.  He presents for evaluation.   Past Medical History:  Diagnosis Date   Acute delirium 07/14/2020   Adenomatous colon polyp 2016   Allergy    AMS (altered mental status) 07/13/2020   Anxiety    Bell palsy    resolved   Bell's palsy 09/14/2020   CHF (congestive heart failure) (HCC)    Chicken pox    Concussion with no loss of consciousness 07/07/2018   COVID-19 04/04/2021   Depression    Diet-controlled diabetes mellitus ([place])  Eating disorder    Essential hypertension 11/29/2017   Gastro-esophageal reflux disease without esophagitis 09/14/2020   GERD (gastroesophageal reflux disease)    History of vitamin D deficiency    Hyperlipidemia    Hypertension    Hypokalemia    Memory loss    MI (myocardial infarction) ([place])    per pt    Neoplasm of uncertain behavior of skin 09/14/2020   Personal history of colonic polyps 09/14/2020   Ulcerative colitis ([place]) 2005-2006   severe     Past Surgical History:  Procedure Laterality Date   COLONOSCOPY WITH PROPOFOL N/A 07/15/2014   Procedure: COLONOSCOPY  WITH PROPOFOL;  Surgeon: [name], MD;  Location: [facility];  Service: Endoscopy;  Laterality: N/A;   COLONOSCOPY WITH PROPOFOL N/A 08/22/2015   Procedure: COLONOSCOPY WITH PROPOFOL;  Surgeon: [name], MD;  Location: [facility];  Service: Endoscopy;  Laterality: N/A;   ORIF ANKLE FRACTURE  02/27/2012   Procedure: OPEN REDUCTION INTERNAL FIXATION (ORIF) ANKLE FRACTURE;  Surgeon: Colin Rhein, MD;  Location: [facility];  Service: Orthopedics;  Laterality: Left;  ORIF left bimalleolar ankle fracture   SHOULDER SURGERY     LEFT   SIGMOIDOSCOPY  10/08/2018   Externa and Internal Hmorrhoids. Tubular and Tublovillous adenoma removed from transverse colon. Inflammatory pseudopolyps, negative for dysplasia   TONSILLECTOMY     UPPER GASTROINTESTINAL ENDOSCOPY      Current Medications: Outpatient Medications Prior to Visit  Medication Sig Dispense Refill   acetaminophen (TYLENOL) 500 MG tablet Take 500 mg by mouth every 6 (six) hours as needed for mild pain, headache or fever.     apixaban (ELIQUIS) 5 MG TABS tablet Take 1 tablet (5 mg total) by mouth 2 (two) times daily. 60 tablet 11   atorvastatin (LIPITOR) 20 MG tablet Take 1 tablet (20 mg total) by mouth daily. 30 tablet 11   azaTHIOprine (IMURAN) 50 MG tablet Take 200 mg by mouth every morning.     carbidopa-levodopa (SINEMET IR) 25-100 MG tablet Take 1 tablet by mouth 3 (three) times daily. Take it with an empty stomach 90 tablet 2   divalproex (DEPAKOTE SPRINKLE) 125 MG capsule Take 6 capsules (750 mg total) by mouth every 12 (twelve) hours. 360 capsule 3   docusate sodium (COLACE) 100 MG capsule Take 1 capsule (100 mg total) by mouth 2 (two) times daily. 60 capsule 11   DULoxetine (CYMBALTA) 60 MG capsule Take 1 capsule (60 mg total) by mouth daily. 90 capsule 1   furosemide (LASIX) 40 MG tablet Take 1 tablet (40 mg total) by mouth 2 (two) times a week. 30 tablet 11   levocetirizine (XYZAL) 5 MG tablet Take  5 mg by mouth every evening.     levothyroxine (SYNTHROID) 50 MCG tablet Take 1 tablet (50 mcg total) by mouth daily before breakfast. 90 tablet 3   Melatonin 10 MG TABS Take 10 mg by mouth at bedtime.     OLANZapine (ZYPREXA) 10 MG tablet 1/2 tab in the morning and 1.5 tabs QHS 180 tablet 0   potassium chloride (KLOR-CON M10) 10 MEQ tablet Take 1 tablet (10 mEq total) by mouth daily. 90 tablet 3   tamsulosin (FLOMAX) 0.4 MG CAPS capsule Take 1 capsule (0.4 mg total) by mouth daily. 90 capsule 1   traZODone (DESYREL) 50 MG tablet TAKE 1 TABLET BY MOUTH EVERYDAY AT BEDTIME 90 tablet 2   metoprolol tartrate (LOPRESSOR) 25 MG tablet Take 0.5 tablets (12.5 mg total)  by mouth 2 (two) times daily. 30 tablet 5   No facility-administered medications prior to visit.     Allergies:   Losartan potassium-hctz, Nsaids, Zoloft [sertraline hcl], and Benzodiazepines   Social History   Socioeconomic History   Marital status: Married    Spouse name: [name]   Number of children: 2   Years of education: 12   Highest education level: Some college, no degree  Occupational History   Occupation: Retired  Tobacco Use   Smoking status: Former    Packs/day: 1.00    Years: 19.00    Pack years: 19.00    Types: Cigarettes    Quit date: 02/25/1982    Years since quitting: 39.2   Smokeless tobacco: Former    Quit date: 1990  Scientific laboratory technician Use: Never used  Substance and Sexual Activity   Alcohol use: Yes    Comment: occ   Drug use: No   Sexual activity: Yes    Partners: Female  Other Topics Concern   Not on file  Social History Narrative   Lives at home with wife [name].   College-educated. Works in Public house manager.    Right handed   Social Determinants of Health   Financial Resource Strain: Low Risk    Difficulty of Paying Living Expenses: Not hard at all  Food Insecurity: No Food Insecurity   Worried About Charity fundraiser in the Last Year: Never true   Ran Out of Food  in the Last Year: Never true  Transportation Needs: No Transportation Needs   Lack of Transportation (Medical): No   Lack of Transportation (Non-Medical): No  Physical Activity: Sufficiently Active   Days of Exercise per Week: 7 days   Minutes of Exercise per Session: 60 min  Stress: No Stress Concern Present   Feeling of Stress : Not at all  Social Connections: Moderately Integrated   Frequency of Communication with Friends and Family: More than three times a week   Frequency of Social Gatherings with Friends and Family: More than three times a week   Attends Religious Services: More than 4 times per year   Active Member of [employer] or Organizations: No   Attends Music therapist: Never   Marital Status: Married     Family History:  The patient's family history includes Dementia in his mother; Early death (age of onset: 69) in his father; Hypertension in his mother; Neuropathy in his brother; Post-traumatic stress disorder in his brother.   ROS General: Negative; No fevers, chills, or night sweats;  HEENT: Negative; No changes in vision or hearing, sinus congestion, difficulty swallowing Pulmonary: Negative; No cough, wheezing, shortness of breath, hemoptysis Cardiovascular: History of PAF, leg swelling GI: Negative; No nausea, vomiting, diarrhea, or abdominal pain GU: Poor bladder control Musculoskeletal: Negative; no myalgias, joint pain, or weakness Hematologic/Oncology: Negative; no easy bruising, bleeding Endocrine: Negative; no heat/cold intolerance; no diabetes Neuro: Parkinson's disease; dementia, tremor Skin: Negative; No rashes or skin lesions Psychiatric: Negative; No behavioral problems, depression Sleep: Positive for severe's obstructive sleep apnea, CPAP was turned in and he is not on therapy; positive for snoring, daytime sleepiness, hypersomnolence; no bruxism, restless legs, hypnogognic hallucinations, no cataplexy Other comprehensive 14 point system  review is negative.   PHYSICAL EXAM:   VS:  BP (!) 96/58 (BP Location: Left Arm, Patient Position: Sitting, Cuff Size: Large)   Pulse (!) 102   Ht _0  (1.702 m)   Wt 230 lb (104.3  kg)   BMI 36.02 kg/m     Repeat blood pressure by me was 114/64 supine and 94/60 standing; resting pulse was tachycardic at 102  Wt Readings from Last 3 Encounters:  05/01/21 230 lb (104.3 kg)  04/04/21 221 lb (100.2 kg)  03/15/21 226 lb 13.7 oz (102.9 kg)    General: Debilitated with obvious tremor Skin: normal turgor, no rashes, warm and dry HEENT: Normocephalic, atraumatic. Pupils equal round and reactive to light; sclera anicteric; extraocular muscles intact;  Nose without nasal septal hypertrophy Mouth/Parynx benign; Mallinpatti scale 3 Neck: Thick neck no JVD, no carotid bruits; normal carotid upstroke Lungs: clear to ausculatation and percussion; no wheezing or rales Chest wall: without tenderness to palpitation Heart: PMI not displaced, irregularly irregular and tachycardic at 102 bpm, s1 s2 normal, 1/6 systolic murmur, no diastolic murmur, no rubs, gallops, thrills, or heaves Abdomen: Abdominal obesity ;soft, nontender; no hepatosplenomehaly, BS+; abdominal aorta nontender and not dilated by palpation. Back: no CVA tenderness Pulses 2+ Musculoskeletal: full range of motion, normal strength, no joint deformities Extremities: Mild leg edema Neurologic: Significant tremor, mild cogwheel rigidity Psychologic: Normal mood and affect   Studies/Labs Reviewed:   EKG:  EKG is ordered today.  ECG (independently read by me):  Atrial fibrillation at 102,  April 19, 2020 ECG (independently read by me): Normal sinus rhythm at 92 bpm.  Nonspecific interventricular block.  QTc interval 472 ms.  No ectopy.  Recent Labs: BMP Latest Ref Rng & Units 03/22/2021 03/21/2021 03/19/2021  Glucose 70 - 99 mg/dL 100(H) 132(H) 101(H)  BUN 8 - 23 mg/dL _0 Creatinine 0.61 - 1.24 mg/dL 0.81 0.78 0.95   BUN/Creat Ratio 6 - 22 (calc) - - -  Sodium 135 - 145 mmol/L 141 138 141  Potassium 3.5 - 5.1 mmol/L 3.7 3.2(L) 3.6  Chloride 98 - 111 mmol/L 104 101 101  CO2 22 - 32 mmol/L 29 28 32  Calcium 8.9 - 10.3 mg/dL 8.9 8.8(L) 8.7(L)     Hepatic Function Latest Ref Rng & Units 03/21/2021 03/19/2021 03/18/2021  Total Protein 6.5 - 8.1 g/dL 5.8(L) 5.8(L) 5.8(L)  Albumin 3.5 - 5.0 g/dL 3.1(L) 3.1(L) 3.2(L)  AST 15 - 41 U/L _1 ALT 0 - 44 U/L _2 Alk Phosphatase 38 - 126 U/L 42 39 36(L)  Total Bilirubin 0.3 - 1.2 mg/dL 1.5(H) 1.3(H) 1.0  Bilirubin, Direct 0.1 - 0.5 mg/dL - - -    CBC Latest Ref Rng & Units 03/22/2021 03/21/2021 03/19/2021  WBC 4.0 - 10.5 K/uL 5.0 5.6 4.2  Hemoglobin 13.0 - 17.0 g/dL 14.9 15.1 14.8  Hematocrit 39.0 - 52.0 % 43.7 44.2 43.4  Platelets 150 - 400 K/uL 156 155 135(L)   Lab Results  Component Value Date   MCV 103.8 (H) 03/22/2021   MCV 102.6 (H) 03/21/2021   MCV 104.1 (H) 03/19/2021   Lab Results  Component Value Date   TSH 2.482 01/11/2021   Lab Results  Component Value Date   HGBA1C 4.7 (L) 03/16/2021     BNP    Component Value Date/Time   BNP 74.1 01/10/2021 1254    ProBNP    Component Value Date/Time   PROBNP 87.0 12/07/2020 1223     Lipid Panel     Component Value Date/Time   CHOL 188 02/18/2019 0553   TRIG 67 03/14/2021 1111   HDL 44 02/18/2019 0553   CHOLHDL 4.3 02/18/2019 0553   VLDL 33 02/18/2019 0553  LDLCALC 111 (H) 02/18/2019 0553     RADIOLOGY: No results found.   Additional studies/ records that were reviewed today include:    Nuclear Study Highlights: 05/08/2019    The left ventricular ejection fraction is mildly decreased (45-54%). Nuclear stress EF: 54%. There was no ST segment deviation noted during stress. Defect 1: There is a small defect of mild severity present in the apical anterior, apical septal and apex location. This is a low risk study.   Low risk, probably normal stress nuclear study  with mild thinning of the distal anteroseptal wall and apex; no ischemia; EF low normal at 54 with normal wall motion.    ECHO: 02/18/2019 IMPRESSIONS   1. The left ventricle has hyperdynamic systolic function, with an  ejection fraction of >65%. The cavity size was normal. There is moderate  concentric left ventricular hypertrophy. Left ventricular diastolic  Doppler parameters are consistent with  pseudonormalization. Elevated left atrial and left ventricular  end-diastolic pressures.   2. The right ventricle has normal systolic function. The cavity was  normal. There is no increase in right ventricular wall thickness.   3. Left atrial size was moderately dilated.   4. The mitral valve is degenerative. Mild thickening of the mitral valve  leaflet. Mild calcification of the mitral valve leaflet. There is mild  mitral annular calcification present.    CT Angio Chest: 01/10/2021 IMPRESSION: 1. No filling defect is identified in the pulmonary arterial tree to suggest pulmonary embolus. 2. Prominent stool throughout the colon favors constipation. 3. Cardiomegaly with hazy patchy ground-glass density/mosaic attenuation in the lung, possibilities including mild pulmonary edema, extrinsic allergic alveolitis, atypical pneumonia, or bronchiolitis obliterans. 4. Other imaging findings of potential clinical significance: Aortic Atherosclerosis (ICD10-I70.0). Coronary atherosclerosis. Mitral and aortic valve calcifications. Chronically stable left ninth rib sclerotic lesion, likely benign. Multilevel lumbar impingement.    ASSESSMENT:    1. PAF (paroxysmal atrial fibrillation) (HCC)   2. Leg swelling   3. Chronic anticoagulation   4. Orthostatic hypotension   5. OSA (obstructive sleep apnea)   6. Dyslipidemia   7. Aortic atherosclerosis ([place])   8. Carotid artery calcification, unspecified laterality   9. Morbid obesity ([place])   10. Hypothyroidism, unspecified type   11. Parkinson's  disease [facility])       PLAN:  [name] is a 70 year-old gentleman who has a history of hypertension, hyperlipidemia, anxiety/depression, GERD, untreated obstructive sleep apnea, remote benzodiazepam dependence who had developed atrial fibrillation with RVR leading to his hospitalization in June 2020.  He has been found to have normal LV function with EF greater than 12%, grade 2 diastolic dysfunction, and elevated left atrial and left ventricular end-diastolic pressures.  A nuclear perfusion study was low risk and did not reveal any significant ischemia.  When I saw him for preoperative clearance prior to undergoing colonoscopy in August 2021 he was maintaining sinus rhythm.  At that time I recommended he hold Eliquis for 48 hours prior to his planned procedure.  Since I saw him, he apparently developed recurrent atrial fibrillation in October 2021.  He also has developed Parkinson's disease and has been recently started on carbidopa levodopa.  He has had issues with progressive dementia as well as hand tremor.  Recently he has noticed lower extremity edema with weight gain from 218 up to 230 pounds.  On exam today he is mildly orthostatic with blood pressure supine by me at 114/64 dropping to 94/60.  His resting pulse was 102.  He has been on furosemide and took 40 mg twice a day over the weekend but typically takes 40 mg 2 times a week.  I am recommending support stockings with 20 to 30 mm of pressure support.  With his increased heart rate, I am suggesting slight titration of metoprolol to 25 mg in the morning and 12.5 mg at night.  I have scheduled him for an echo Doppler study to reassess LV systolic and diastolic function particularly following his COVID infection in July.  He is no longer on amlodipine.  If he continues to have orthostatic blood pressure issues in addition of low-dose midodrine may be necessary.  He has sleep apnea, currently untreated and still snores.  He continues to be on  Cymbalta for anxiety and depression he is anticoagulated on apixaban without bleeding.  He has had recent laboratory by Dr. [name] which I reviewed from July 2022.  Potassium was 3.7.  BUN 14 creatinine 0.81.  Does not appear that he has had recent lipid studies in June 2020 LDL cholesterol was increased at 111.  He is on atorvastatin 20 mg daily.  He is chest CTA from May 2022 did reveal aortic atherosclerosis and coronary calcification.  Target LDL is less than 70.  I will see him in 2 months for follow-up evaluation or sooner if needed.   Medication Adjustments/Labs and Tests Ordered: Current medicines are reviewed at length with the patient today.  Concerns regarding medicines are outlined above.  Medication changes, Labs and Tests ordered today are listed in the Patient Instructions below. Patient Instructions  Medication Instructions:  You may take Furosemide 1 tablet in the evening as needed for swelling Change how you take the Metoprolol- take 1 tablet (25 mg) in the morning and 0.5 tablet (12.5 mg) in the evening.   *If you need a refill on your cardiac medications before your next appointment, please call your pharmacy*   Testing/Procedures: Echocardiogram - Your physician has requested that you have an echocardiogram. Echocardiography is a painless test that uses sound waves to create images of your heart. It provides your doctor with information about the size and shape of your heart and how well your heart's chambers and valves are working. This procedure takes approximately one hour. There are no restrictions for this procedure. This will be performed at our [facility] location - [address].    Follow-Up: At [facility], you and your health needs are our priority.  As part of our continuing mission to provide you with exceptional heart care, we have created designated Provider Care Teams.  These Care Teams include your primary Cardiologist (physician) and Advanced  Practice Providers (APPs -  Physician Assistants and Nurse Practitioners) who all work together to provide you with the care you need, when you need it.  We recommend signing up for the patient portal called "MyChart".  Sign up information is provided on this After Visit Summary.  MyChart is used to connect with patients for Virtual Visits (Telemedicine).  Patients are able to view lab/test results, encounter notes, upcoming appointments, etc.  Non-urgent messages can be sent to your provider as well.   To learn more about what you can do with MyChart, go to [email or web address].    Your next appointment:   3 month(s)  The format for your next appointment:   In Person  Provider:   You may see Shelva Majestic, MD or one of the following Advanced Practice Providers on your designated Care  Team:   Almyra Deforest, PA-C Fabian Sharp, PA-C or  Roby Lofts, PA-C   Signed, Shelva Majestic, MD  05/03/2021 10:07 PM    [place] [address] [place], [place], [place]  [address] Phone: [phone]

## 2021-05-01 NOTE — Telephone Encounter (Signed)
approved

## 2021-05-01 NOTE — Patient Instructions (Addendum)
Medication Instructions:  You may take Furosemide 1 tablet in the evening as needed for swelling Change how you take the Metoprolol- take 1 tablet (25 mg) in the morning and 0.5 tablet (12.5 mg) in the evening.   *If you need a refill on your cardiac medications before your next appointment, please call your pharmacy*   Testing/Procedures: Echocardiogram - Your physician has requested that you have an echocardiogram. Echocardiography is a painless test that uses sound waves to create images of your heart. It provides your doctor with information about the size and shape of your heart and how well your heart's chambers and valves are working. This procedure takes approximately one hour. There are no restrictions for this procedure. This will be performed at our [facility] location - [address], [address].    Follow-Up: At [facility], you and your health needs are our priority.  As part of our continuing mission to provide you with exceptional heart care, we have created designated Provider Care Teams.  These Care Teams include your primary Cardiologist (physician) and Advanced Practice Providers (APPs -  Physician Assistants and Nurse Practitioners) who all work together to provide you with the care you need, when you need it.  We recommend signing up for the patient portal called "MyChart".  Sign up information is provided on this After Visit Summary.  MyChart is used to connect with patients for Virtual Visits (Telemedicine).  Patients are able to view lab/test results, encounter notes, upcoming appointments, etc.  Non-urgent messages can be sent to your provider as well.   To learn more about what you can do with MyChart, go to [email or web address].    Your next appointment:   3 month(s)  The format for your next appointment:   In Person  Provider:   You may see Shelva Majestic, MD or one of the following Advanced Practice Providers on your designated Care Team:   Almyra Deforest,  PA-C Fabian Sharp, PA-C or  Roby Lofts, [place]

## 2021-05-02 DIAGNOSIS — I5032 Chronic diastolic (congestive) heart failure: Secondary | ICD-10-CM | POA: Diagnosis not present

## 2021-05-02 DIAGNOSIS — I110 Hypertensive heart disease with heart failure: Secondary | ICD-10-CM | POA: Diagnosis not present

## 2021-05-02 DIAGNOSIS — F05 Delirium due to known physiological condition: Secondary | ICD-10-CM | POA: Diagnosis not present

## 2021-05-02 DIAGNOSIS — F0281 Dementia in other diseases classified elsewhere with behavioral disturbance: Secondary | ICD-10-CM | POA: Diagnosis not present

## 2021-05-02 DIAGNOSIS — I2510 Atherosclerotic heart disease of native coronary artery without angina pectoris: Secondary | ICD-10-CM | POA: Diagnosis not present

## 2021-05-02 DIAGNOSIS — K5190 Ulcerative colitis, unspecified, without complications: Secondary | ICD-10-CM | POA: Diagnosis not present

## 2021-05-02 DIAGNOSIS — N400 Enlarged prostate without lower urinary tract symptoms: Secondary | ICD-10-CM | POA: Diagnosis not present

## 2021-05-02 DIAGNOSIS — G309 Alzheimer's disease, unspecified: Secondary | ICD-10-CM | POA: Diagnosis not present

## 2021-05-02 DIAGNOSIS — I480 Paroxysmal atrial fibrillation: Secondary | ICD-10-CM | POA: Diagnosis not present

## 2021-05-03 ENCOUNTER — Telehealth: Payer: Self-pay

## 2021-05-03 ENCOUNTER — Encounter: Payer: Self-pay | Admitting: Cardiovascular Disease

## 2021-05-03 DIAGNOSIS — G309 Alzheimer's disease, unspecified: Secondary | ICD-10-CM | POA: Diagnosis not present

## 2021-05-03 DIAGNOSIS — N400 Enlarged prostate without lower urinary tract symptoms: Secondary | ICD-10-CM | POA: Diagnosis not present

## 2021-05-03 DIAGNOSIS — F0281 Dementia in other diseases classified elsewhere with behavioral disturbance: Secondary | ICD-10-CM | POA: Diagnosis not present

## 2021-05-03 DIAGNOSIS — I480 Paroxysmal atrial fibrillation: Secondary | ICD-10-CM | POA: Diagnosis not present

## 2021-05-03 DIAGNOSIS — I5032 Chronic diastolic (congestive) heart failure: Secondary | ICD-10-CM | POA: Diagnosis not present

## 2021-05-03 DIAGNOSIS — F05 Delirium due to known physiological condition: Secondary | ICD-10-CM | POA: Diagnosis not present

## 2021-05-03 DIAGNOSIS — I2510 Atherosclerotic heart disease of native coronary artery without angina pectoris: Secondary | ICD-10-CM | POA: Diagnosis not present

## 2021-05-03 DIAGNOSIS — K5190 Ulcerative colitis, unspecified, without complications: Secondary | ICD-10-CM | POA: Diagnosis not present

## 2021-05-03 DIAGNOSIS — I110 Hypertensive heart disease with heart failure: Secondary | ICD-10-CM | POA: Diagnosis not present

## 2021-05-03 NOTE — Telephone Encounter (Signed)
Home health orders received 05/02/21 for [employer]. Home health initiation orders: No.  Home health re-certification orders: Yes. Patient last seen by ordering physician for this condition: 04/04/21. Must be less than 90 days for re-certification and less than 30 days prior for initiation. Visit must have been for the condition the orders are being placed.  Patient meets criteria for Physician to sign orders: Yes.        Current med list has been attached: No        Orders placed on physicians desk for signature: 05/03/21 (date) If patient does not meet criteria for orders to be signed: pt was called to schedule appt. Appt is scheduled for n/a.   Kavin Leech

## 2021-05-03 NOTE — Telephone Encounter (Signed)
Order signed and returned to [place] work [employer]

## 2021-05-03 NOTE — Telephone Encounter (Signed)
Orders faxed

## 2021-05-04 DIAGNOSIS — K5190 Ulcerative colitis, unspecified, without complications: Secondary | ICD-10-CM | POA: Diagnosis not present

## 2021-05-04 DIAGNOSIS — I110 Hypertensive heart disease with heart failure: Secondary | ICD-10-CM | POA: Diagnosis not present

## 2021-05-04 DIAGNOSIS — G309 Alzheimer's disease, unspecified: Secondary | ICD-10-CM | POA: Diagnosis not present

## 2021-05-04 DIAGNOSIS — I5032 Chronic diastolic (congestive) heart failure: Secondary | ICD-10-CM | POA: Diagnosis not present

## 2021-05-04 DIAGNOSIS — I480 Paroxysmal atrial fibrillation: Secondary | ICD-10-CM | POA: Diagnosis not present

## 2021-05-04 DIAGNOSIS — F05 Delirium due to known physiological condition: Secondary | ICD-10-CM | POA: Diagnosis not present

## 2021-05-04 DIAGNOSIS — N400 Enlarged prostate without lower urinary tract symptoms: Secondary | ICD-10-CM | POA: Diagnosis not present

## 2021-05-04 DIAGNOSIS — I2510 Atherosclerotic heart disease of native coronary artery without angina pectoris: Secondary | ICD-10-CM | POA: Diagnosis not present

## 2021-05-04 DIAGNOSIS — F0281 Dementia in other diseases classified elsewhere with behavioral disturbance: Secondary | ICD-10-CM | POA: Diagnosis not present

## 2021-05-05 ENCOUNTER — Telehealth: Payer: Self-pay | Admitting: Neurology

## 2021-05-05 ENCOUNTER — Other Ambulatory Visit: Payer: Medicare HMO

## 2021-05-05 NOTE — Telephone Encounter (Signed)
Per Pt Wife [name], the Carbidopa levodopa not helping. Advised pt only had the medication for a week now. We ask pt to try medication for at least four weeks.   Will call Shanon Brow to try an schedule [employer], pt PA scanned to [employer] 05/02/21.

## 2021-05-09 DIAGNOSIS — I2510 Atherosclerotic heart disease of native coronary artery without angina pectoris: Secondary | ICD-10-CM | POA: Diagnosis not present

## 2021-05-09 DIAGNOSIS — G309 Alzheimer's disease, unspecified: Secondary | ICD-10-CM | POA: Diagnosis not present

## 2021-05-09 DIAGNOSIS — K5190 Ulcerative colitis, unspecified, without complications: Secondary | ICD-10-CM | POA: Diagnosis not present

## 2021-05-09 DIAGNOSIS — F05 Delirium due to known physiological condition: Secondary | ICD-10-CM | POA: Diagnosis not present

## 2021-05-09 DIAGNOSIS — I5032 Chronic diastolic (congestive) heart failure: Secondary | ICD-10-CM | POA: Diagnosis not present

## 2021-05-09 DIAGNOSIS — I110 Hypertensive heart disease with heart failure: Secondary | ICD-10-CM | POA: Diagnosis not present

## 2021-05-09 DIAGNOSIS — N400 Enlarged prostate without lower urinary tract symptoms: Secondary | ICD-10-CM | POA: Diagnosis not present

## 2021-05-09 DIAGNOSIS — I480 Paroxysmal atrial fibrillation: Secondary | ICD-10-CM | POA: Diagnosis not present

## 2021-05-09 DIAGNOSIS — F0281 Dementia in other diseases classified elsewhere with behavioral disturbance: Secondary | ICD-10-CM | POA: Diagnosis not present

## 2021-05-16 DIAGNOSIS — I480 Paroxysmal atrial fibrillation: Secondary | ICD-10-CM | POA: Diagnosis not present

## 2021-05-16 DIAGNOSIS — I2510 Atherosclerotic heart disease of native coronary artery without angina pectoris: Secondary | ICD-10-CM | POA: Diagnosis not present

## 2021-05-16 DIAGNOSIS — F05 Delirium due to known physiological condition: Secondary | ICD-10-CM | POA: Diagnosis not present

## 2021-05-16 DIAGNOSIS — I5032 Chronic diastolic (congestive) heart failure: Secondary | ICD-10-CM | POA: Diagnosis not present

## 2021-05-16 DIAGNOSIS — F0281 Dementia in other diseases classified elsewhere with behavioral disturbance: Secondary | ICD-10-CM | POA: Diagnosis not present

## 2021-05-16 DIAGNOSIS — N400 Enlarged prostate without lower urinary tract symptoms: Secondary | ICD-10-CM | POA: Diagnosis not present

## 2021-05-16 DIAGNOSIS — K5190 Ulcerative colitis, unspecified, without complications: Secondary | ICD-10-CM | POA: Diagnosis not present

## 2021-05-16 DIAGNOSIS — G309 Alzheimer's disease, unspecified: Secondary | ICD-10-CM | POA: Diagnosis not present

## 2021-05-16 DIAGNOSIS — I110 Hypertensive heart disease with heart failure: Secondary | ICD-10-CM | POA: Diagnosis not present

## 2021-05-17 DIAGNOSIS — F0281 Dementia in other diseases classified elsewhere with behavioral disturbance: Secondary | ICD-10-CM | POA: Diagnosis not present

## 2021-05-17 DIAGNOSIS — N400 Enlarged prostate without lower urinary tract symptoms: Secondary | ICD-10-CM | POA: Diagnosis not present

## 2021-05-17 DIAGNOSIS — G309 Alzheimer's disease, unspecified: Secondary | ICD-10-CM | POA: Diagnosis not present

## 2021-05-17 DIAGNOSIS — I110 Hypertensive heart disease with heart failure: Secondary | ICD-10-CM | POA: Diagnosis not present

## 2021-05-17 DIAGNOSIS — I480 Paroxysmal atrial fibrillation: Secondary | ICD-10-CM | POA: Diagnosis not present

## 2021-05-17 DIAGNOSIS — I5032 Chronic diastolic (congestive) heart failure: Secondary | ICD-10-CM | POA: Diagnosis not present

## 2021-05-17 DIAGNOSIS — F05 Delirium due to known physiological condition: Secondary | ICD-10-CM | POA: Diagnosis not present

## 2021-05-17 DIAGNOSIS — K5190 Ulcerative colitis, unspecified, without complications: Secondary | ICD-10-CM | POA: Diagnosis not present

## 2021-05-17 DIAGNOSIS — I2510 Atherosclerotic heart disease of native coronary artery without angina pectoris: Secondary | ICD-10-CM | POA: Diagnosis not present

## 2021-05-18 DIAGNOSIS — M6281 Muscle weakness (generalized): Secondary | ICD-10-CM | POA: Diagnosis not present

## 2021-05-18 DIAGNOSIS — R2681 Unsteadiness on feet: Secondary | ICD-10-CM | POA: Diagnosis not present

## 2021-05-18 DIAGNOSIS — I5032 Chronic diastolic (congestive) heart failure: Secondary | ICD-10-CM | POA: Diagnosis not present

## 2021-05-22 ENCOUNTER — Other Ambulatory Visit: Payer: Self-pay

## 2021-05-22 ENCOUNTER — Ambulatory Visit (HOSPITAL_COMMUNITY): Payer: Medicare HMO | Attending: Cardiology

## 2021-05-22 DIAGNOSIS — K5190 Ulcerative colitis, unspecified, without complications: Secondary | ICD-10-CM | POA: Diagnosis not present

## 2021-05-22 DIAGNOSIS — F0281 Dementia in other diseases classified elsewhere with behavioral disturbance: Secondary | ICD-10-CM | POA: Diagnosis not present

## 2021-05-22 DIAGNOSIS — G309 Alzheimer's disease, unspecified: Secondary | ICD-10-CM | POA: Diagnosis not present

## 2021-05-22 DIAGNOSIS — I480 Paroxysmal atrial fibrillation: Secondary | ICD-10-CM | POA: Diagnosis not present

## 2021-05-22 DIAGNOSIS — I2510 Atherosclerotic heart disease of native coronary artery without angina pectoris: Secondary | ICD-10-CM | POA: Diagnosis not present

## 2021-05-22 DIAGNOSIS — N400 Enlarged prostate without lower urinary tract symptoms: Secondary | ICD-10-CM | POA: Diagnosis not present

## 2021-05-22 DIAGNOSIS — I110 Hypertensive heart disease with heart failure: Secondary | ICD-10-CM | POA: Diagnosis not present

## 2021-05-22 DIAGNOSIS — I5032 Chronic diastolic (congestive) heart failure: Secondary | ICD-10-CM | POA: Diagnosis not present

## 2021-05-22 DIAGNOSIS — F05 Delirium due to known physiological condition: Secondary | ICD-10-CM | POA: Diagnosis not present

## 2021-05-22 LAB — ECHOCARDIOGRAM COMPLETE: S' Lateral: 3.4 cm

## 2021-05-24 DIAGNOSIS — G309 Alzheimer's disease, unspecified: Secondary | ICD-10-CM | POA: Diagnosis not present

## 2021-05-24 DIAGNOSIS — I480 Paroxysmal atrial fibrillation: Secondary | ICD-10-CM | POA: Diagnosis not present

## 2021-05-24 DIAGNOSIS — I2510 Atherosclerotic heart disease of native coronary artery without angina pectoris: Secondary | ICD-10-CM | POA: Diagnosis not present

## 2021-05-24 DIAGNOSIS — I110 Hypertensive heart disease with heart failure: Secondary | ICD-10-CM | POA: Diagnosis not present

## 2021-05-24 DIAGNOSIS — K5190 Ulcerative colitis, unspecified, without complications: Secondary | ICD-10-CM | POA: Diagnosis not present

## 2021-05-24 DIAGNOSIS — N400 Enlarged prostate without lower urinary tract symptoms: Secondary | ICD-10-CM | POA: Diagnosis not present

## 2021-05-24 DIAGNOSIS — I5032 Chronic diastolic (congestive) heart failure: Secondary | ICD-10-CM | POA: Diagnosis not present

## 2021-05-24 DIAGNOSIS — F05 Delirium due to known physiological condition: Secondary | ICD-10-CM | POA: Diagnosis not present

## 2021-05-24 DIAGNOSIS — F0281 Dementia in other diseases classified elsewhere with behavioral disturbance: Secondary | ICD-10-CM | POA: Diagnosis not present

## 2021-05-25 ENCOUNTER — Encounter (HOSPITAL_COMMUNITY): Admission: RE | Admit: 2021-05-25 | Payer: Medicare HMO | Source: Ambulatory Visit

## 2021-05-25 ENCOUNTER — Encounter (HOSPITAL_COMMUNITY): Payer: Medicare HMO

## 2021-05-26 ENCOUNTER — Other Ambulatory Visit: Payer: Self-pay

## 2021-05-29 DIAGNOSIS — I480 Paroxysmal atrial fibrillation: Secondary | ICD-10-CM | POA: Diagnosis not present

## 2021-05-29 DIAGNOSIS — N400 Enlarged prostate without lower urinary tract symptoms: Secondary | ICD-10-CM | POA: Diagnosis not present

## 2021-05-29 DIAGNOSIS — I2510 Atherosclerotic heart disease of native coronary artery without angina pectoris: Secondary | ICD-10-CM | POA: Diagnosis not present

## 2021-05-29 DIAGNOSIS — K5190 Ulcerative colitis, unspecified, without complications: Secondary | ICD-10-CM | POA: Diagnosis not present

## 2021-05-29 DIAGNOSIS — G309 Alzheimer's disease, unspecified: Secondary | ICD-10-CM | POA: Diagnosis not present

## 2021-05-29 DIAGNOSIS — I5032 Chronic diastolic (congestive) heart failure: Secondary | ICD-10-CM | POA: Diagnosis not present

## 2021-05-29 DIAGNOSIS — I110 Hypertensive heart disease with heart failure: Secondary | ICD-10-CM | POA: Diagnosis not present

## 2021-05-29 DIAGNOSIS — F0281 Dementia in other diseases classified elsewhere with behavioral disturbance: Secondary | ICD-10-CM | POA: Diagnosis not present

## 2021-05-29 DIAGNOSIS — F05 Delirium due to known physiological condition: Secondary | ICD-10-CM | POA: Diagnosis not present

## 2021-05-30 ENCOUNTER — Telehealth: Payer: Self-pay | Admitting: Cardiovascular Disease

## 2021-05-30 MED ORDER — FUROSEMIDE 40 MG PO TABS: 40.0000 mg | ORAL_TABLET | Freq: Every day | ORAL | 3 refills | Status: DC | PRN

## 2021-05-30 NOTE — Telephone Encounter (Signed)
Pt c/o medication issue:  1. Name of Medication: furosemide (LASIX) 40 MG tablet  2. How are you currently taking this medication (dosage and times per day)? Take 1 tablet (40 mg total) by mouth 2 (two) times a week.  3. Are you having a reaction (difficulty breathing--STAT)? no  4. What is your medication issue? Pt is needing a new rx sent to pharmacy with correct dosage of 40m once daily rather than twice a week. Pt says Dr. KClaiborne Billingsupdated this but has not sent over the correct rx, please advise

## 2021-05-30 NOTE — Telephone Encounter (Signed)
Last OV with Dr. Claiborne Billings 08/22-lasix changed to 40 mg daily PRN.  Rx updated and sent to pharmacy

## 2021-06-01 ENCOUNTER — Other Ambulatory Visit: Payer: Self-pay

## 2021-06-01 ENCOUNTER — Encounter (HOSPITAL_COMMUNITY)
Admission: RE | Admit: 2021-06-01 | Discharge: 2021-06-01 | Disposition: A | Discharge status: Home / Self Care | Payer: Medicare HMO | Source: Ambulatory Visit | Attending: Physician Assistant | Admitting: Physician Assistant

## 2021-06-01 DIAGNOSIS — G20 Parkinson's disease: Secondary | ICD-10-CM | POA: Insufficient documentation

## 2021-06-01 DIAGNOSIS — R251 Tremor, unspecified: Secondary | ICD-10-CM | POA: Diagnosis not present

## 2021-06-01 DIAGNOSIS — G3184 Mild cognitive impairment, so stated: Secondary | ICD-10-CM | POA: Diagnosis not present

## 2021-06-01 DIAGNOSIS — G250 Essential tremor: Secondary | ICD-10-CM | POA: Diagnosis not present

## 2021-06-01 MED ORDER — IOFLUPANE I 123 185 MBQ/2.5ML IV SOLN
4.9000 | Freq: Once | INTRAVENOUS | Status: AC | PRN
  Administered 2021-06-01: 4.9 via INTRAVENOUS
  Filled 2021-06-01: qty 5

## 2021-06-01 MED ORDER — POTASSIUM IODIDE (ANTIDOTE) 130 MG PO TABS
ORAL_TABLET | ORAL | Status: AC
  Filled 2021-06-01: qty 1

## 2021-06-01 MED ORDER — POTASSIUM IODIDE (ANTIDOTE) 130 MG PO TABS: 130.0000 mg | ORAL_TABLET | Freq: Once | ORAL | Status: DC

## 2021-06-05 ENCOUNTER — Telehealth: Payer: Self-pay | Admitting: Neurology

## 2021-06-05 NOTE — Telephone Encounter (Signed)
Pt wife is calling back for results. Said she missed a call and doesn't know who called

## 2021-06-05 NOTE — Progress Notes (Signed)
Voicemail, will call back

## 2021-06-17 DIAGNOSIS — M6281 Muscle weakness (generalized): Secondary | ICD-10-CM | POA: Diagnosis not present

## 2021-06-17 DIAGNOSIS — I5032 Chronic diastolic (congestive) heart failure: Secondary | ICD-10-CM | POA: Diagnosis not present

## 2021-06-17 DIAGNOSIS — R2681 Unsteadiness on feet: Secondary | ICD-10-CM | POA: Diagnosis not present

## 2021-06-19 ENCOUNTER — Ambulatory Visit: Payer: Medicare HMO | Admitting: Physician Assistant

## 2021-06-26 DIAGNOSIS — R3989 Other symptoms and signs involving the genitourinary system: Secondary | ICD-10-CM | POA: Diagnosis not present

## 2021-06-27 ENCOUNTER — Encounter: Payer: Self-pay | Admitting: Neurology

## 2021-06-27 ENCOUNTER — Ambulatory Visit (INDEPENDENT_AMBULATORY_CARE_PROVIDER_SITE_OTHER): Payer: Medicare HMO | Admitting: Neurology

## 2021-06-27 ENCOUNTER — Other Ambulatory Visit: Payer: Self-pay

## 2021-06-27 VITALS — BP 108/76 | HR 93 | Ht 68.0 in | Wt 230.0 lb

## 2021-06-27 DIAGNOSIS — F0280 Dementia in other diseases classified elsewhere without behavioral disturbance: Secondary | ICD-10-CM

## 2021-06-27 DIAGNOSIS — F02818 Dementia in other diseases classified elsewhere, unspecified severity, with other behavioral disturbance: Secondary | ICD-10-CM | POA: Diagnosis not present

## 2021-06-27 DIAGNOSIS — G301 Alzheimer's disease with late onset: Secondary | ICD-10-CM | POA: Diagnosis not present

## 2021-06-27 NOTE — Progress Notes (Signed)
NEUROLOGY FOLLOW UP OFFICE NOTE  [name] Sr. [id number] Jan 18, 1951  HISTORY OF PRESENT ILLNESS: I had the pleasure of seeing [name] Sr. in follow-up in the neurology clinic on 06/27/2021.  The patient was last seen in our office by Memory Disorders PA Sharene Butters 2 months ago. 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 Sinemet was briefly started then stopped. Speech today is again tangential, he is able to speak full sentences but a lot of times she does not understand what he is saying. He denies any symptoms, he asks his wife "why are you saying yes?," and she reminds him that he says he has phlegm and coughs. He says he does not remember saying that. She feels he is worse than before and asks for a "stronger medication." He eats constantly. She has aides coming from [place] to 9pm. He wakes up at 4-5am, sometimes picking up things and packing to leave. There is a big problem with urinary incontinence. He has gone to the neighbors house and she found him sitting on their porch. His last fall has been a while back, he sometimes shuffles with his walker. He has some drooling. He was previously on a higher dose of Zyprexa prescribed by his psychiatrist. He is on Depakote 754m BID and olanzapine 517min AM, 1572mn PM. He is also on Trazodone and Cymbalta. His wife also takes care of her mother with dementia and would like to keep both of them home as long as possible.    History on Initial Assessment 12/16/2020: This is a pleasant 70 81ar old right-handed man with a history of hypertension, hyperlipidemia, DM, untreated sleep apnea (could not tolerate different CPAP masks), depression, anxiety, presenting to establish care for dementia. He is accompanied by his wife who helps supplement the history today.  Records were reviewed, he was evaluated by neurologist Dr. AheJaynee Eagles 2019. Memory changes started in 2018, he was forgetting appointments and having difficulties managing medications. MMSE 24/30, previously 28/30, felt due to untreated significant depression. He was also found to have sleep apnea. Brain MRI in 2019 showed mild to moderate diffuse atrophy, more pronounced in the mesial temporal lobes. He had a PET scan in 2019 with mild (very subtle) decreased relative cortical metabolism within the biparietal and temporal lobes compared to the frontal lobes. Neuropsychological evaluation had been recommended however he was unable to proceed. He presents today with continued cognitive decline. Speech today is tangential. His wife noticed speech changes become more apparent in 2020. He started having issues with atrial fibrillation and was in the hospital, and she was told he had dementia. He was brought to the hospital in November 2021 for agitation, confusion and was under IVC in December 2021. He was hallucinating, combative, not recognizing his wife. He was in the hospital until December 31st, she retired the day prior and brought him home. He lives with his wife and 92 79ar old mother-in-law who also has dementia. They have a caregiver during the daytime Monday to Thursday. She reports he has been worse ever since. He stopped driving in October [id number] he went on the highway in his underwear. Twice he roamed out at night in the winter in his diaper, shirt, socks, and walked 3 houses down saying he had an appointment to service their fireplace. House now has  locks.He would put 2 shirts on, he has a diaper, pajama pants, and his pants on today. He will take a bath after much encouragement. Most of the time he is sitting on his chair. He does not recognize their home of 18 years, he would urinate where he is because he cannot get to the bathroom. He has lost his wallet, hearing aids, he has taken her dentures. He  packs stuff and pockets so many things in small places that do not make sense. She notes he is constantly moving his hands. He has been seeing psychiatry and is on Depakote 7279m BID, Cymbalta 653mdaily, olanzapine 79m31mn AM, 179m31m PM. He was started on Memantine but wife notes a reverse reaction on him, symptoms got real bad and dose reduced to 10mg32mly.   Speech fluent but nonsensical and tangential at times. Other times he makes paraphasic errors with word-finding difficulties. He does not answer questions consistently. He states he married his first wife in 1955 80she died in 1945.32states they have 3 children (they have 2 children). He has been having pain in his back and leg the past 3 days. He was in the ER last Monday after he lost his balance from standing up from the toilet, hitting his head on the wall. No loss of consciousness. His wife notes sleep is okay, she gives 3 over the counter sleep medications, including Benadryl and melatonin 10mg 39m He has visual hallucinations, asking about 2 men or the boys in their home. Half the time there are 3 of his wife, he does not want her to touch him because his wife who lives upstairs does not like when he touches her.     PAST MEDICAL HISTORY: Past Medical History:  Diagnosis Date   Acute delirium 07/14/2020   Adenomatous colon polyp 2016   Allergy    AMS (altered mental status) 07/13/2020   Anxiety    Bell palsy    resolved   Bell's palsy 09/14/2020   CHF (congestive heart failure) (HCC)    Chicken pox    Concussion with no loss of consciousness 07/07/2018   COVID-19 04/04/2021   Depression    Diet-controlled diabetes mellitus (HCC)  [place] disorder    Essential hypertension 11/29/2017   Gastro-esophageal reflux disease without esophagitis 09/14/2020   GERD (gastroesophageal reflux disease)    History of vitamin D deficiency    Hyperlipidemia    Hypertension    Hypokalemia    Memory loss    MI (my[place] infarction) ([place]CC)  [place] [facility]    Neoplasm of uncertain behavior of skin 09/14/2020   Personal history of colonic polyps 09/14/2020   Ulcerative colitis (HCC) [place]2006   severe     MEDICATIONS: Current Outpatient Medications on File Prior to Visit  Medication Sig Dispense Refill   acetaminophen (TYLENOL) 500 MG tablet Take 500 mg by mouth every 6 (six) hours as needed for mild pain, headache or fever.     apixaban (ELIQUIS) 5 MG TABS tablet Take 1 tablet (5 mg total) by mouth 2 (two) times daily. 60 tablet 11   atorvastatin (LIPITOR) 20 MG tablet Take 1 tablet (20 mg total) by mouth daily. 30 tablet 11   azaTHIOprine (IMURAN) 50 MG tablet Take 200 mg by mouth every morning.     carbidopa-levodopa (SINEMET IR) 25-100 MG tablet Take 1 tablet by mouth 3 (three) times daily. Take it with an empty stomach 90 tablet 2  divalproex (DEPAKOTE SPRINKLE) 125 MG capsule Take 6 capsules (750 mg total) by mouth every 12 (twelve) hours. 360 capsule 3   docusate sodium (COLACE) 100 MG capsule Take 1 capsule (100 mg total) by mouth 2 (two) times daily. 60 capsule 11   DULoxetine (CYMBALTA) 60 MG capsule Take 1 capsule (60 mg total) by mouth daily. 90 capsule 1   furosemide (LASIX) 40 MG tablet Take 1 tablet (40 mg total) by mouth daily as needed. 90 tablet 3   levocetirizine (XYZAL) 5 MG tablet Take 5 mg by mouth every evening.     levothyroxine (SYNTHROID) 50 MCG tablet Take 1 tablet (50 mcg total) by mouth daily before breakfast. 90 tablet 3   Melatonin 10 MG TABS Take 10 mg by mouth at bedtime.     metoprolol tartrate (LOPRESSOR) 25 MG tablet Take 1 tablet (25 mg) by mouth in the morning, and 0.5 tablet (12.5 mg) in the evening. 45 tablet 3   OLANZapine (ZYPREXA) 10 MG tablet 1/2 tab in the morning and 1.5 tabs QHS 180 tablet 0   potassium chloride (KLOR-CON M10) 10 MEQ tablet Take 1 tablet (10 mEq total) by mouth daily. 90 tablet 3   tamsulosin (FLOMAX) 0.4 MG CAPS capsule Take 1 capsule (0.4 mg total) by mouth daily. 90  capsule 1   traZODone (DESYREL) 50 MG tablet TAKE 1 TABLET BY MOUTH EVERYDAY AT BEDTIME 90 tablet 2   No current facility-administered medications on file prior to visit.    ALLERGIES: Allergies  Allergen Reactions   Losartan Potassium-Hctz Diarrhea   Nsaids Other (See Comments)    On eliquis.    Zoloft [Sertraline Hcl] Diarrhea   Benzodiazepines Other (See Comments)    Agitation.    FAMILY HISTORY: Family History  Problem Relation Age of Onset   Dementia Mother    Hypertension Mother    Early death Father 32       drowning    Post-traumatic stress disorder Brother    Neuropathy Brother     SOCIAL HISTORY: Social History   Socioeconomic History   Marital status: Married    Spouse name: [name]   Number of children: 2   Years of education: 12   Highest education level: Some college, no degree  Occupational History   Occupation: Retired  Tobacco Use   Smoking status: Former    Packs/day: 1.00    Years: 19.00    Pack years: 19.00    Types: Cigarettes    Quit date: 02/25/1982    Years since quitting: 39.3   Smokeless tobacco: Former    Quit date: 1990  Scientific laboratory technician Use: Never used  Substance and Sexual Activity   Alcohol use: Yes    Comment: occ   Drug use: No   Sexual activity: Yes    Partners: Female  Other Topics Concern   Not on file  Social History Narrative   Lives at home with wife [name].   College-educated. Works in Public house manager.    Right handed   Social Determinants of Health   Financial Resource Strain: Low Risk    Difficulty of Paying Living Expenses: Not hard at all  Food Insecurity: No Food Insecurity   Worried About Charity fundraiser in the Last Year: Never true   Ran Out of Food in the Last Year: Never true  Transportation Needs: No Transportation Needs   Lack of Transportation (Medical): No   Lack of Transportation (Non-Medical): No  Physical Activity: Sufficiently Active   Days of Exercise per Week: 7  days   Minutes of Exercise per Session: 60 min  Stress: No Stress Concern Present   Feeling of Stress : Not at all  Social Connections: Moderately Integrated   Frequency of Communication with Friends and Family: More than three times a week   Frequency of Social Gatherings with Friends and Family: More than three times a week   Attends Religious Services: More than 4 times per year   Active Member of [employer] or Organizations: No   Attends Music therapist: Never   Marital Status: Married  Human resources officer Violence: Not At Risk   Fear of Current or Ex-Partner: No   Emotionally Abused: No   Physically Abused: No   Sexually Abused: No     PHYSICAL EXAM: Vitals:   06/27/21 1514  BP: 108/76  Pulse: 93  SpO2: 96%   General: No acute distress Head:  Normocephalic/atraumatic Skin/Extremities: No rash, no edema Neurological Exam: alert and awake. He is able to answer questions inconsistently, with tangential speech. Able to follow commands. Fund of knowledge is reduced.  Recent and remote memory are impaired. Attention and concentration are reduced. Cranial nerves: Pupils equal, round. Extraocular movements intact with no nystagmus. Visual fields full, he is able to count fingers.  No facial asymmetry.  Motor: Bulk and tone normal, no cogwheeling, muscle strength 5/5 throughout with no pronator drift.   Finger to nose testing intact.  Gait slightly-wide based, no ataxia, fair arm swing. Good finger taps.No resting tremor, he has bilateral postural and endpoint tremor  IMPRESSION: This is a pleasant 70 yo RH man with a history of hypertension, hyperlipidemia, DM, untreated sleep apnea (could not tolerate different CPAP masks), depression, anxiety, with Alzheimer's disease with behavioral disturbance. Speech is again tangential, unable to do formal memory testing. He had side effects on dementia medications. He had increasing tremors, DaTscan did not show any evidence of parkinsonian  pathology, tremors likely drug-induced. Wife was advised to wean down Depakote to 5 caps BID for 1 week, then 4 caps BID for 1 week, monitor behaviors on lower dose. He has a psychiatrist, continue management of behavioral issues with Psychiatry. Caregiver support provided.  Follow-up with Memory Disorders PA Sharene Butters in 6 months, they know to call for any changes.    Thank you for allowing me to participate in his care.  Please do not hesitate to call for any questions or concerns.    Ellouise Newer, M.D.   CC: Dr. [name]

## 2021-06-27 NOTE — Patient Instructions (Signed)
Start weaning down the Depakote to 5 caps twice a day for 1 week, then 4 caps twice a day for 1 week, and see how he does as you continue to lower dose  2. Contact your psychiatrist for adjustment of medications for behaviors associated with dementia  3. Follow-up in 6 months, call for any changes   FALL PRECAUTIONS: Be cautious when walking. Scan the area for obstacles that may increase the risk of trips and falls. When getting up in the mornings, sit up at the edge of the bed for a few minutes before getting out of bed. Consider elevating the bed at the head end to avoid drop of blood pressure when getting up. Walk always in a well-lit room (use night lights in the walls). Avoid area rugs or power cords from appliances in the middle of the walkways. Use a walker or a cane if necessary and consider physical therapy for balance exercise. Get your eyesight checked regularly.  HOME SAFETY: Consider the safety of the kitchen when operating appliances like stoves, microwave oven, and blender. Consider having supervision and share cooking responsibilities until no longer able to participate in those. Accidents with firearms and other hazards in the house should be identified and addressed as well.  ABILITY TO BE LEFT ALONE: If patient is unable to contact 911 operator, consider using LifeLine, or when the need is there, arrange for someone to stay with patients. Smoking is a fire hazard, consider supervision or cessation. Risk of wandering should be assessed by caregiver and if detected at any point, supervision and safe proof recommendations should be instituted.  RECOMMENDATIONS FOR ALL PATIENTS WITH MEMORY PROBLEMS: 1. Continue to exercise (Recommend 30 minutes of walking everyday, or 3 hours every week) 2. Increase social interactions - continue going to [place] and enjoy social gatherings with friends and family 3. Eat healthy, avoid fried foods and eat more fruits and vegetables 4. Maintain adequate  blood pressure, blood sugar, and blood cholesterol level. Reducing the risk of stroke and cardiovascular disease also helps promoting better memory. 5. Avoid stressful situations. Live a simple life and avoid aggravations. Organize your time and prepare for the next day in anticipation. 6. Sleep well, avoid any interruptions of sleep and avoid any distractions in the bedroom that may interfere with adequate sleep quality 7. Avoid sugar, avoid sweets as there is a strong link between excessive sugar intake, diabetes, and cognitive impairment The Mediterranean diet has been shown to help patients reduce the risk of progressive memory disorders and reduces cardiovascular risk. This includes eating fish, eat fruits and green leafy vegetables, nuts like almonds and hazelnuts, walnuts, and also use olive oil. Avoid fast foods and fried foods as much as possible. Avoid sweets and sugar as sugar use has been linked to worsening of memory function.  There is always a concern of gradual progression of memory problems. If this is the case, then we may need to adjust level of care according to patient needs. Support, both to the patient and caregiver, should then be put into place.

## 2021-07-13 DIAGNOSIS — M25561 Pain in right knee: Secondary | ICD-10-CM | POA: Diagnosis not present

## 2021-07-17 DIAGNOSIS — F339 Major depressive disorder, recurrent, unspecified: Secondary | ICD-10-CM | POA: Diagnosis not present

## 2021-07-20 ENCOUNTER — Other Ambulatory Visit: Payer: Self-pay | Admitting: Physician Assistant

## 2021-07-22 ENCOUNTER — Other Ambulatory Visit: Payer: Self-pay | Admitting: Family Medicine

## 2021-07-31 ENCOUNTER — Other Ambulatory Visit: Payer: Self-pay

## 2021-07-31 MED ORDER — FUROSEMIDE 40 MG PO TABS: 40.0000 mg | ORAL_TABLET | Freq: Every day | ORAL | 3 refills | Status: AC | PRN

## 2021-08-07 ENCOUNTER — Telehealth: Payer: Self-pay | Admitting: Family Medicine

## 2021-08-07 NOTE — Telephone Encounter (Signed)
Pt sched with provider 12/13 at 3 pm. Please advise if pt needs sooner appt or any add'l instructions.

## 2021-08-07 NOTE — Telephone Encounter (Signed)
Patient is on medication for depression and anxiety.  If his depression is worsening he should be seen by psychiatry team.  If she feels the changes are secondary to his neurology team weaning off of Depakote, then they should call the neurology team to see if they feel differently about weaning off medication with his current change in behavior.  FYI: Depakote was not started by this provider, it was started by the inpatient team.  December appointment is fine.

## 2021-08-07 NOTE — Telephone Encounter (Signed)
Caller Name: [name] Call back phone #: [phone]  Reason for Call: Worried about pt since weaning off medication (Depakote Sprinkle). Dr. [name] originally ordered, per [name]. [name] states that Dr. Delice Lesch advised to wean pt off of it. He is down to 4/day (started at taking 12/day). [name] is not home with the pt right now. She said he has started suckling (like he has a pacifier in his mouth) over the past 2 wks. She said that he has become depressed. She is concerned and asking for advice. Did not triage since she is not home with the pt.

## 2021-08-08 ENCOUNTER — Ambulatory Visit (INDEPENDENT_AMBULATORY_CARE_PROVIDER_SITE_OTHER): Payer: Medicare HMO | Admitting: Cardiovascular Disease

## 2021-08-08 ENCOUNTER — Encounter: Payer: Self-pay | Admitting: Cardiovascular Disease

## 2021-08-08 ENCOUNTER — Other Ambulatory Visit: Payer: Self-pay

## 2021-08-08 DIAGNOSIS — E039 Hypothyroidism, unspecified: Secondary | ICD-10-CM | POA: Diagnosis not present

## 2021-08-08 DIAGNOSIS — G20 Parkinson's disease: Secondary | ICD-10-CM

## 2021-08-08 DIAGNOSIS — Z7901 Long term (current) use of anticoagulants: Secondary | ICD-10-CM

## 2021-08-08 DIAGNOSIS — E785 Hyperlipidemia, unspecified: Secondary | ICD-10-CM

## 2021-08-08 DIAGNOSIS — G20A1 Parkinson's disease without dyskinesia, without mention of fluctuations: Secondary | ICD-10-CM

## 2021-08-08 DIAGNOSIS — I700 Atherosclerosis of aorta: Secondary | ICD-10-CM | POA: Diagnosis not present

## 2021-08-08 DIAGNOSIS — I5032 Chronic diastolic (congestive) heart failure: Secondary | ICD-10-CM

## 2021-08-08 DIAGNOSIS — I4811 Longstanding persistent atrial fibrillation: Secondary | ICD-10-CM

## 2021-08-08 MED ORDER — METOPROLOL SUCCINATE ER 25 MG PO TB24: 25.0000 mg | ORAL_TABLET | Freq: Every day | ORAL | 3 refills | Status: DC

## 2021-08-08 NOTE — Progress Notes (Signed)
Cardiology Office Note    Date:  08/15/2021   ID:  [name] Flesher Sr., DOB 1951/02/16, MRN [id number]  PCP:  [name], DO  Cardiologist:  Shelva Majestic, MD   One year F/U  office evaluation   History of Present Illness:  [name] HUSKINS Sr. is a 70 y.o. male who presents for a 3 month follow-up evaluation.    Mr. [name] has a history of anxiety and depression, GERD, obesity, untreated obstructive sleep apnea, diabetes mellitus, hypertension, hyperlipidemia, hypothyroidism, benzodiazepine dependence as well as memory loss.  He has  a history of atrial fibrillation with RVR and has been maintaining sinus rhythm on anticoagulation therapy.  He was admitted to the hospital in June 2020 which time he was found to be in atrial fibrillation with RVR.  An echo Doppler study showed an EF greater than 65%, moderate concentric LVH, grade 2 diastolic dysfunction, elevated left atrial/left ventricular end-diastolic pressure and mild mitral annular calcification.  At that time he was started on metoprolol tartrate for rate control and Eliquis 5 mg twice daily for stroke prophylaxis given his CHA2DS2-VASc score of 3.  He was also started on levothyroxine for an elevated TSH level.  A  Lexiscan nuclear perfusion study performed on May 08, 2019 was low risk and probably normal with mild thinning of the distal anteroseptal wall and apex without associated ischemia.  EF was 54% with normal wall motion.  He has a history of ulcerative colitis.  When I saw him in August 2021 he was to undergo colonoscopy procedure and was referred by Dr. Driscilla Grammes for preoperative clearance.  At that time, he denied any chest pain, PND orthopnea.  He was unaware of any recurrent breakthrough atrial fibrillation.  He has a history of obstructive sleep apnea and has been followed by  [facility] neurology with Dr. Rexene Alberts.  Apparently a home study confirmed severe obstructive sleep apnea with CPAP was recommended.  Due to nonuse, his CPAP was  ultimately returned.  He has not been on therapy.  His sleep is poor.  He admits to daytime sleepiness.  According to his wife he snores.  He had an episode of atrial fibrillation in October 2021 and was at [facility].  He developed COVID in July 2022.   I last saw him on May 01, 2021.he was having issues with progressive leg swelling and his weight increased from 218 pounds to 230 pounds.  He also has been diagnosed with Parkinson's disease and was recently started on carbidopa levodopa 25/100 mg tablets 3 times a day. He has dementia, tremors, and poor bladder control.  He was on Cymbalta for anxiety and depression.  He continues to be on anticoagulation with apixaban and is on metoprolol tartrate 12.5 mg twice a day and has been taking furosemide 40 mg twice a day over this past weekend.  He has ulcerative colitis and is on azathioprine.  He continues to be on atorvastatin 20 mg for hyperlipidemia.  During that evaluation, he was mildly orthostatic on exam with blood pressure 114/64 dropping to 94/60 and resting pulse was 102.  Had been on furosemide and apparently had taken 40 mg twice a day over the weekend instead of his typical 40 mg 2 times a week.  I recommended support stockings with 2020 to 30 mm pressure support.  I recommended he undergo follow-up echo Doppler assessment.  If he continues to have orthostatic blood pressure issues low-dose midodrine may be necessary.  Since I  last saw him, his dementia has gotten significantly worse.  He is followed by our neurology and sees Dr. [name] for primary care.  His dose of Depakote has been weaned and previously he was taking 6 capsules every 12 hours and now he was taking 2 capsules twice a day.  He was no longer on carbidopa levodopa.  He was now taking furosemide 40 mg daily and was taking metoprolol tartrate 25 mg in the morning and 12.5 mg in the evening.  He continues to be on Eliquis for anticoagulation.  He is on atorvastatin 20 mg  for hypothyroidism and levothyroxine 50 mcg for hypothyroidism.  He continues to be on Cymbalta.  He is here with his wife   Past Medical History:  Diagnosis Date   Acute delirium 07/14/2020   Adenomatous colon polyp 2016   Allergy    AMS (altered mental status) 07/13/2020   Anxiety    Bell palsy    resolved   Bell's palsy 09/14/2020   CHF (congestive heart failure) (HCC)    Chicken pox    Concussion with no loss of consciousness 07/07/2018   COVID-19 04/04/2021   Depression    Diet-controlled diabetes mellitus ([place])    Eating disorder    Essential hypertension 11/29/2017   Gastro-esophageal reflux disease without esophagitis 09/14/2020   GERD (gastroesophageal reflux disease)    History of vitamin D deficiency    Hyperlipidemia    Hypertension    Hypokalemia    Memory loss    MI (myocardial infarction) ([place])    per pt    Neoplasm of uncertain behavior of skin 09/14/2020   Personal history of colonic polyps 09/14/2020   Ulcerative colitis ([place]) 2005-2006   severe     Past Surgical History:  Procedure Laterality Date   COLONOSCOPY WITH PROPOFOL N/A 07/15/2014   Procedure: COLONOSCOPY WITH PROPOFOL;  Surgeon: [name], MD;  Location: [facility];  Service: Endoscopy;  Laterality: N/A;   COLONOSCOPY WITH PROPOFOL N/A 08/22/2015   Procedure: COLONOSCOPY WITH PROPOFOL;  Surgeon: [name], MD;  Location: [facility];  Service: Endoscopy;  Laterality: N/A;   ORIF ANKLE FRACTURE  02/27/2012   Procedure: OPEN REDUCTION INTERNAL FIXATION (ORIF) ANKLE FRACTURE;  Surgeon: Colin Rhein, MD;  Location: [facility];  Service: Orthopedics;  Laterality: Left;  ORIF left bimalleolar ankle fracture   SHOULDER SURGERY     LEFT   SIGMOIDOSCOPY  10/08/2018   Externa and Internal Hmorrhoids. Tubular and Tublovillous adenoma removed from transverse colon. Inflammatory pseudopolyps, negative for dysplasia   TONSILLECTOMY     UPPER GASTROINTESTINAL ENDOSCOPY      Current  Medications: Outpatient Medications Prior to Visit  Medication Sig Dispense Refill   acetaminophen (TYLENOL) 500 MG tablet Take 500 mg by mouth every 6 (six) hours as needed for mild pain, headache or fever.     apixaban (ELIQUIS) 5 MG TABS tablet Take 1 tablet (5 mg total) by mouth 2 (two) times daily. 60 tablet 11   atorvastatin (LIPITOR) 20 MG tablet Take 1 tablet (20 mg total) by mouth daily. 30 tablet 11   azaTHIOprine (IMURAN) 50 MG tablet Take 200 mg by mouth every morning.     carbidopa-levodopa (SINEMET IR) 25-100 MG tablet TAKE 1 TABLET BY MOUTH 3 (THREE) TIMES DAILY. TAKE IT WITH AN EMPTY STOMACH 270 tablet 0   divalproex (DEPAKOTE SPRINKLE) 125 MG capsule Take 6 capsules (750 mg total) by mouth every 12 (twelve) hours. 360 capsule 3  docusate sodium (COLACE) 100 MG capsule Take 1 capsule (100 mg total) by mouth 2 (two) times daily. 60 capsule 11   DULoxetine (CYMBALTA) 60 MG capsule Take 1 capsule (60 mg total) by mouth daily. 90 capsule 1   furosemide (LASIX) 40 MG tablet Take 1 tablet (40 mg total) by mouth daily as needed. 90 tablet 3   levocetirizine (XYZAL) 5 MG tablet Take 5 mg by mouth every evening.     levothyroxine (SYNTHROID) 50 MCG tablet Take 1 tablet (50 mcg total) by mouth daily before breakfast. 90 tablet 3   Melatonin 10 MG TABS Take 10 mg by mouth at bedtime.     OLANZapine (ZYPREXA) 10 MG tablet 1/2 tab in the morning and 1.5 tabs QHS 180 tablet 0   potassium chloride (KLOR-CON M10) 10 MEQ tablet Take 1 tablet (10 mEq total) by mouth daily. 90 tablet 3   tamsulosin (FLOMAX) 0.4 MG CAPS capsule TAKE 1 CAPSULE BY MOUTH EVERY DAY 30 capsule 0   traZODone (DESYREL) 50 MG tablet TAKE 1 TABLET BY MOUTH EVERYDAY AT BEDTIME 90 tablet 2   metoprolol tartrate (LOPRESSOR) 25 MG tablet Take 1 tablet (25 mg) by mouth in the morning, and 0.5 tablet (12.5 mg) in the evening. 45 tablet 3   No facility-administered medications prior to visit.     Allergies:   Losartan  potassium-hctz, Nsaids, Zoloft [sertraline hcl], and Benzodiazepines   Social History   Socioeconomic History   Marital status: Married    Spouse name: [name]   Number of children: 2   Years of education: 12   Highest education level: Some college, no degree  Occupational History   Occupation: Retired  Tobacco Use   Smoking status: Former    Packs/day: 1.00    Years: 19.00    Pack years: 19.00    Types: Cigarettes    Quit date: 02/25/1982    Years since quitting: 39.4   Smokeless tobacco: Former    Quit date: 1990  Scientific laboratory technician Use: Never used  Substance and Sexual Activity   Alcohol use: Yes    Comment: occ   Drug use: No   Sexual activity: Yes    Partners: Female  Other Topics Concern   Not on file  Social History Narrative   Lives at home with wife [name].   College-educated. Works in Public house manager.    Right handed   Social Determinants of Health   Financial Resource Strain: Low Risk    Difficulty of Paying Living Expenses: Not hard at all  Food Insecurity: No Food Insecurity   Worried About Charity fundraiser in the Last Year: Never true   Ran Out of Food in the Last Year: Never true  Transportation Needs: No Transportation Needs   Lack of Transportation (Medical): No   Lack of Transportation (Non-Medical): No  Physical Activity: Sufficiently Active   Days of Exercise per Week: 7 days   Minutes of Exercise per Session: 60 min  Stress: No Stress Concern Present   Feeling of Stress : Not at all  Social Connections: Moderately Integrated   Frequency of Communication with Friends and Family: More than three times a week   Frequency of Social Gatherings with Friends and Family: More than three times a week   Attends Religious Services: More than 4 times per year   Active Member of [employer] or Organizations: No   Attends Archivist Meetings: Never   Marital Status: Married  Family History:  The patient's family history  includes Dementia in his mother; Early death (age of onset: 20) in his father; Hypertension in his mother; Neuropathy in his brother; Post-traumatic stress disorder in his brother.   ROS General: Negative; No fevers, chills, or night sweats;  HEENT: Negative; No changes in vision or hearing, sinus congestion, difficulty swallowing Pulmonary: Negative; No cough, wheezing, shortness of breath, hemoptysis Cardiovascular: History of PAF, leg swelling GI: Negative; No nausea, vomiting, diarrhea, or abdominal pain GU: Poor bladder control Musculoskeletal: Negative; no myalgias, joint pain, or weakness Hematologic/Oncology: Negative; no easy bruising, bleeding Endocrine: Negative; no heat/cold intolerance; no diabetes Neuro: Parkinson's disease; progressive dementia, tremor Skin: Negative; No rashes or skin lesions Psychiatric:  depression Sleep: Positive for severe's obstructive sleep apnea, CPAP was turned in and he is not on therapy; positive for snoring, daytime sleepiness, hypersomnolence; no bruxism, restless legs, hypnogognic hallucinations, no cataplexy Other comprehensive 14 point system review is negative.   PHYSICAL EXAM:   VS:  BP (!) 102/56   Pulse 96   Ht 5' 7"  (1.702 m)   Wt 240 lb 6.4 oz (109 kg)   SpO2 97%   BMI 37.65 kg/m     Repeat blood pressure by me was 108/60  Wt Readings from Last 3 Encounters:  08/08/21 240 lb 6.4 oz (109 kg)  06/27/21 230 lb (104.3 kg)  05/01/21 230 lb (104.3 kg)    General: Debilitated with obvious tremor Skin: normal turgor, no rashes, warm and dry HEENT: Normocephalic, atraumatic. Pupils equal round and reactive to light; sclera anicteric; extraocular muscles intact;  Nose without nasal septal hypertrophy Mouth/Parynx benign; Mallinpatti scale 3 Neck: Thick neck no JVD, no carotid bruits; normal carotid upstroke Lungs: clear to ausculatation and percussion; no wheezing or rales Chest wall: without tenderness to palpitation Heart: PMI  not displaced, irregularly irregular in the 90s, s1 s2 normal, 1/6 systolic murmur, no diastolic murmur, no rubs, gallops, thrills, or heaves Abdomen: Abdominal obesity ;soft, nontender; no hepatosplenomehaly, BS+; abdominal aorta nontender and not dilated by palpation. Back: no CVA tenderness Pulses 2+ Musculoskeletal: full range of motion, normal strength, no joint deformities Extremities: Mild leg edema Neurologic: Significant tremor, mild cogwheel rigidity Psychologic: Significant dementia   Studies/Labs Reviewed:   EKG:  EKG is ordered today.ECG (independently read by me): Atrial fibrillation at 96.  May 01, 2021  ECG (independently read by me):  Atrial fibrillation at 102,  April 19, 2020 ECG (independently read by me): Normal sinus rhythm at 92 bpm.  Nonspecific interventricular block.  QTc interval 472 ms.  No ectopy.  Recent Labs: BMP Latest Ref Rng & Units 03/22/2021 03/21/2021 03/19/2021  Glucose 70 - 99 mg/dL 100(H) 132(H) 101(H)  BUN 8 - 23 mg/dL 14 17 18   Creatinine 0.61 - 1.24 mg/dL 0.81 0.78 0.95  BUN/Creat Ratio 6 - 22 (calc) - - -  Sodium 135 - 145 mmol/L 141 138 141  Potassium 3.5 - 5.1 mmol/L 3.7 3.2(L) 3.6  Chloride 98 - 111 mmol/L 104 101 101  CO2 22 - 32 mmol/L 29 28 32  Calcium 8.9 - 10.3 mg/dL 8.9 8.8(L) 8.7(L)     Hepatic Function Latest Ref Rng & Units 03/21/2021 03/19/2021 03/18/2021  Total Protein 6.5 - 8.1 g/dL 5.8(L) 5.8(L) 5.8(L)  Albumin 3.5 - 5.0 g/dL 3.1(L) 3.1(L) 3.2(L)  AST 15 - 41 U/L 20 19 26   ALT 0 - 44 U/L 12 14 17   Alk Phosphatase 38 - 126 U/L 42 39 36(L)  Total  Bilirubin 0.3 - 1.2 mg/dL 1.5(H) 1.3(H) 1.0  Bilirubin, Direct 0.1 - 0.5 mg/dL - - -    CBC Latest Ref Rng & Units 03/22/2021 03/21/2021 03/19/2021  WBC 4.0 - 10.5 K/uL 5.0 5.6 4.2  Hemoglobin 13.0 - 17.0 g/dL 14.9 15.1 14.8  Hematocrit 39.0 - 52.0 % 43.7 44.2 43.4  Platelets 150 - 400 K/uL 156 155 135(L)   Lab Results  Component Value Date   MCV 103.8 (H) 03/22/2021   MCV  102.6 (H) 03/21/2021   MCV 104.1 (H) 03/19/2021   Lab Results  Component Value Date   TSH 2.482 01/11/2021   Lab Results  Component Value Date   HGBA1C 4.7 (L) 03/16/2021     BNP    Component Value Date/Time   BNP 74.1 01/10/2021 1254    ProBNP    Component Value Date/Time   PROBNP 87.0 12/07/2020 1223     Lipid Panel     Component Value Date/Time   CHOL 188 02/18/2019 0553   TRIG 67 03/14/2021 1111   HDL 44 02/18/2019 0553   CHOLHDL 4.3 02/18/2019 0553   VLDL 33 02/18/2019 0553   LDLCALC 111 (H) 02/18/2019 0553     RADIOLOGY: No results found.   Additional studies/ records that were reviewed today include:    Nuclear Study Highlights: 05/08/2019    The left ventricular ejection fraction is mildly decreased (45-54%). Nuclear stress EF: 54%. There was no ST segment deviation noted during stress. Defect 1: There is a small defect of mild severity present in the apical anterior, apical septal and apex location. This is a low risk study.   Low risk, probably normal stress nuclear study with mild thinning of the distal anteroseptal wall and apex; no ischemia; EF low normal at 54 with normal wall motion.    ECHO: 02/18/2019 IMPRESSIONS   1. The left ventricle has hyperdynamic systolic function, with an  ejection fraction of >65%. The cavity size was normal. There is moderate  concentric left ventricular hypertrophy. Left ventricular diastolic  Doppler parameters are consistent with  pseudonormalization. Elevated left atrial and left ventricular  end-diastolic pressures.   2. The right ventricle has normal systolic function. The cavity was  normal. There is no increase in right ventricular wall thickness.   3. Left atrial size was moderately dilated.   4. The mitral valve is degenerative. Mild thickening of the mitral valve  leaflet. Mild calcification of the mitral valve leaflet. There is mild  mitral annular calcification present.    CT Angio Chest:  01/10/2021 IMPRESSION: 1. No filling defect is identified in the pulmonary arterial tree to suggest pulmonary embolus. 2. Prominent stool throughout the colon favors constipation. 3. Cardiomegaly with hazy patchy ground-glass density/mosaic attenuation in the lung, possibilities including mild pulmonary edema, extrinsic allergic alveolitis, atypical pneumonia, or bronchiolitis obliterans. 4. Other imaging findings of potential clinical significance: Aortic Atherosclerosis (ICD10-I70.0). Coronary atherosclerosis. Mitral and aortic valve calcifications. Chronically stable left ninth rib sclerotic lesion, likely benign. Multilevel lumbar impingement.    ASSESSMENT:    1. Chronic diastolic CHF (congestive heart failure) ([place])   2. Longstanding persistent atrial fibrillation ([place])   3. Chronic anticoagulation   4. Dyslipidemia   5. Aortic atherosclerosis ([place])   6. Hypothyroidism, unspecified type   7. Parkinson's disease [facility])     PLAN:  Mr. Tarris Delbene is a 70 year-old gentleman who has a history of hypertension, hyperlipidemia, anxiety/depression, GERD, untreated obstructive sleep apnea, remote benzodiazepam dependence who had developed atrial fibrillation  with RVR leading to his hospitalization in June 2020.  He has been found to have normal LV function with EF greater than 73%, grade 2 diastolic dysfunction, and elevated left atrial and left ventricular end-diastolic pressures.  A nuclear perfusion study was low risk and did not reveal any significant ischemia.  When I saw him for preoperative clearance prior to undergoing colonoscopy in August 2021 he was maintaining sinus rhythm.  At that time I recommended he hold Eliquis for 48 hours prior to his planned procedure.  He subsequently developed recurrent atrial fibrillation in October 2021.  He also has developed progressive Parkinson's disease and has significantly developed worsening dementia as well as hand tremor.  He is followed by  neurology.  He had been treated with carbidopa/levodopa which has been discontinued since it was felt not to be effective.  He also had been treated with Depakote and his neurologist has now been weaning him down to his current dose of 2 tablets twice a day.  He continues to be permanent atrial fibrillation and is anticoagulated with Eliquis 5 mg twice a day.  I have recommended changing his metoprolol tartrate to metoprolol succinate at 25 mg for once a day dosing and for potential more stable heart rate control.  If his heart rate increases and is greater than 100 there should be further titrated to 37.5 mg or 50 mg daily as BP allows.  He is followed closely by Dr. Howard Pouch has been checking laboratory.  Follow-up echo Doppler study in September 2022 was reviewed with he and his wife which showed EF at 50% with mild hypokinesis of the mid to apical septum.  There was mild LVH.  Left atrial size was mildly dilated.  There was mild to moderate aortic sclerosis without stenosis.  His chest CTA from May 2022 demonstrated aortic atherosclerosis and coronary calcification.  He is on statin therapy with target LDL less than 70.   Medication Adjustments/Labs and Tests Ordered: Current medicines are reviewed at length with the patient today.  Concerns regarding medicines are outlined above.  Medication changes, Labs and Tests ordered today are listed in the Patient Instructions below. Patient Instructions  Medication Instructions:  STOP: Metoprolol tartrate START: Metoprolol Succinate 25 mg daily   *If you need a refill on your cardiac medications before your next appointment, please call your pharmacy*   Lab Work: None ordered today   Testing/Procedures: None ordered today   Follow-Up: At [facility], you and your health needs are our priority.  As part of our continuing mission to provide you with exceptional heart care, we have created designated Provider Care Teams.  These Care Teams  include your primary Cardiologist (physician) and Advanced Practice Providers (APPs -  Physician Assistants and Nurse Practitioners) who all work together to provide you with the care you need, when you need it.  We recommend signing up for the patient portal called "MyChart".  Sign up information is provided on this After Visit Summary.  MyChart is used to connect with patients for Virtual Visits (Telemedicine).  Patients are able to view lab/test results, encounter notes, upcoming appointments, etc.  Non-urgent messages can be sent to your provider as well.   To learn more about what you can do with MyChart, go to [email or web address].    Your next appointment:   6 month(s)  The format for your next appointment:   In Person  Provider:   Shelva Majestic, MD        Signed, Marcello Moores  Claiborne Billings, MD  08/15/2021 5:15 PM    [facility] [address], [address], [place], [place]  [address] Phone: [phone]

## 2021-08-08 NOTE — Patient Instructions (Signed)
Medication Instructions:  STOP: Metoprolol tartrate START: Metoprolol Succinate 25 mg daily   *If you need a refill on your cardiac medications before your next appointment, please call your pharmacy*   Lab Work: None ordered today   Testing/Procedures: None ordered today   Follow-Up: At [facility], you and your health needs are our priority.  As part of our continuing mission to provide you with exceptional heart care, we have created designated Provider Care Teams.  These Care Teams include your primary Cardiologist (physician) and Advanced Practice Providers (APPs -  Physician Assistants and Nurse Practitioners) who all work together to provide you with the care you need, when you need it.  We recommend signing up for the patient portal called "MyChart".  Sign up information is provided on this After Visit Summary.  MyChart is used to connect with patients for Virtual Visits (Telemedicine).  Patients are able to view lab/test results, encounter notes, upcoming appointments, etc.  Non-urgent messages can be sent to your provider as well.   To learn more about what you can do with MyChart, go to [email or web address].    Your next appointment:   6 month(s)  The format for your next appointment:   In Person  Provider:   Shelva Majestic, MD

## 2021-08-08 NOTE — Telephone Encounter (Signed)
Spoke with pt wife regarding meds and instructions.

## 2021-08-09 DIAGNOSIS — F339 Major depressive disorder, recurrent, unspecified: Secondary | ICD-10-CM | POA: Diagnosis not present

## 2021-08-15 ENCOUNTER — Encounter: Payer: Self-pay | Admitting: Cardiovascular Disease

## 2021-08-22 ENCOUNTER — Other Ambulatory Visit: Payer: Self-pay

## 2021-08-22 ENCOUNTER — Ambulatory Visit (INDEPENDENT_AMBULATORY_CARE_PROVIDER_SITE_OTHER): Payer: Medicare HMO | Admitting: Family Medicine

## 2021-08-22 VITALS — BP 110/76 | HR 92 | Temp 98.3°F | Ht 67.0 in | Wt 239.6 lb

## 2021-08-22 DIAGNOSIS — R600 Localized edema: Secondary | ICD-10-CM

## 2021-08-22 DIAGNOSIS — E038 Other specified hypothyroidism: Secondary | ICD-10-CM

## 2021-08-22 DIAGNOSIS — F411 Generalized anxiety disorder: Secondary | ICD-10-CM

## 2021-08-22 DIAGNOSIS — I517 Cardiomegaly: Secondary | ICD-10-CM

## 2021-08-22 DIAGNOSIS — N400 Enlarged prostate without lower urinary tract symptoms: Secondary | ICD-10-CM

## 2021-08-22 DIAGNOSIS — F339 Major depressive disorder, recurrent, unspecified: Secondary | ICD-10-CM

## 2021-08-22 DIAGNOSIS — I5032 Chronic diastolic (congestive) heart failure: Secondary | ICD-10-CM | POA: Diagnosis not present

## 2021-08-22 DIAGNOSIS — I480 Paroxysmal atrial fibrillation: Secondary | ICD-10-CM

## 2021-08-22 DIAGNOSIS — Z23 Encounter for immunization: Secondary | ICD-10-CM | POA: Diagnosis not present

## 2021-08-22 DIAGNOSIS — G4700 Insomnia, unspecified: Secondary | ICD-10-CM

## 2021-08-22 DIAGNOSIS — R9431 Abnormal electrocardiogram [ECG] [EKG]: Secondary | ICD-10-CM | POA: Diagnosis not present

## 2021-08-22 DIAGNOSIS — F03C0 Unspecified dementia, severe, without behavioral disturbance, psychotic disturbance, mood disturbance, and anxiety: Secondary | ICD-10-CM

## 2021-08-22 DIAGNOSIS — K51019 Ulcerative (chronic) pancolitis with unspecified complications: Secondary | ICD-10-CM

## 2021-08-22 DIAGNOSIS — D6869 Other thrombophilia: Secondary | ICD-10-CM | POA: Diagnosis not present

## 2021-08-22 DIAGNOSIS — E063 Autoimmune thyroiditis: Secondary | ICD-10-CM

## 2021-08-22 MED ORDER — LEVOTHYROXINE SODIUM 50 MCG PO TABS: 50.0000 ug | ORAL_TABLET | Freq: Every day | ORAL | 3 refills | Status: AC

## 2021-08-22 MED ORDER — TAMSULOSIN HCL 0.4 MG PO CAPS: 0.4000 mg | ORAL_CAPSULE | Freq: Every day | ORAL | 11 refills | Status: AC

## 2021-08-22 MED ORDER — POTASSIUM CHLORIDE CRYS ER 10 MEQ PO TBCR: 10.0000 meq | EXTENDED_RELEASE_TABLET | Freq: Every day | ORAL | 3 refills | Status: AC

## 2021-08-22 MED ORDER — DULOXETINE HCL 60 MG PO CPEP: 60.0000 mg | ORAL_CAPSULE | Freq: Every day | ORAL | 1 refills | Status: AC

## 2021-08-22 MED ORDER — APIXABAN 5 MG PO TABS: 5.0000 mg | ORAL_TABLET | Freq: Two times a day (BID) | ORAL | 11 refills | Status: AC

## 2021-08-22 NOTE — Progress Notes (Signed)
Told me    This visit occurred during the SARS-CoV-2 public health emergency.  Safety protocols were in place, including screening questions prior to the visit, additional usage of staff PPE, and extensive cleaning of exam room while observing appropriate contact time as indicated for disinfecting solutions.    [name] Flesher Sr. , June 08, 1951, 70 y.o., male MRN: [id number] Patient Care Team    Relationship Specialty Notifications Start End  [name], DO PCP - General Family Medicine  11/08/17   Troy Sine, MD PCP - Cardiology Cardiology Admissions 02/17/19   [name], MD Consulting Physician Neurology  11/08/17   Clarene Essex, MD Consulting Physician Gastroenterology  11/29/17   Melissa Noon, [place] Referring Physician Optometry  06/30/20   Rod Can, MD Consulting Physician Orthopedic Surgery  06/30/20   Ashley Royalty, [place]  Physician Assistant  06/30/20   Cameron Sprang, MD Consulting Physician Neurology  12/16/20     Chief Complaint  Patient presents with   Medication Management     Subjective: Pt presents for an OV for follow-up [place].    Hypothyroidism due to Hashimoto's thyroiditis Condition has been stable on current dose of levothyroxine.  Labs are up-to-date  Chronic ulcerative pancolitis with complication [facility]) Managed by gastroenterology  Benign prostatic hyperplasia, unspecified whether lower urinary tract symptoms present Stable on Flomax.   Major depression, recurrent, chronic (HCC)/insomnia/generalized anxiety disorder/dementia without behavioral disturbances Patient has rather severe dementia.  He is started wondering behaviors and has been found outside the home.  Wife has locks on the doors, unfortunately patient has discovered these and has started to unlock the doors in room around the neighborhood.  He is not sleeping well.  Neurology team has increased his trazodone dose. Patient is established with psychiatry team.  He is prescribed Zyprexa,  Depakote and Cymbalta which was started by the inpatient psychiatry team and to be followed by the outpatient psychiatry team and managed.  Depression screen [facility] 2/9 12/13/2020 06/30/2020 12/09/2019 05/26/2019 11/17/2018  Decreased Interest 1 3 0 0 0  Down, Depressed, Hopeless 0 3 1 1 1   PHQ - 2 Score 1 6 1 1 1   Altered sleeping - 3 0 1 1  Tired, decreased energy - 2 2 1 1   Change in appetite - 3 1 1  0  Feeling bad or failure about yourself  - 3 0 1 1  Trouble concentrating - 1 1 1  0  Moving slowly or fidgety/restless - 2 0 1 0  Suicidal thoughts - 1 0 0 0  PHQ-9 Score - 21 5 7 4   Difficult doing work/chores - - Not difficult at all Somewhat difficult Somewhat difficult    Allergies  Allergen Reactions   Losartan Potassium-Hctz Diarrhea   Nsaids Other (See Comments)    On eliquis.    Zoloft [Sertraline Hcl] Diarrhea   Benzodiazepines Other (See Comments)    Agitation.   Social History   Social History Narrative   Lives at home with wife [name].   College-educated. Works in Public house manager.    Right handed   Past Medical History:  Diagnosis Date   Acute delirium 07/14/2020   Adenomatous colon polyp 2016   Allergy    AMS (altered mental status) 07/13/2020   Anxiety    Bell palsy    resolved   Bell's palsy 09/14/2020   CHF (congestive heart failure) (HCC)    Chicken pox    Concussion with no loss of consciousness 07/07/2018  COVID-19 04/04/2021   Depression    Diet-controlled diabetes mellitus ([place])    Eating disorder    Essential hypertension 11/29/2017   Gastro-esophageal reflux disease without esophagitis 09/14/2020   GERD (gastroesophageal reflux disease)    History of vitamin D deficiency    Hyperlipidemia    Hypertension    Hypokalemia    Memory loss    MI (myocardial infarction) ([place])    per pt    Neoplasm of uncertain behavior of skin 09/14/2020   Personal history of colonic polyps 09/14/2020   Ulcerative colitis ([place]) 2005-2006   severe    Past  Surgical History:  Procedure Laterality Date   COLONOSCOPY WITH PROPOFOL N/A 07/15/2014   Procedure: COLONOSCOPY WITH PROPOFOL;  Surgeon: [name], MD;  Location: [facility];  Service: Endoscopy;  Laterality: N/A;   COLONOSCOPY WITH PROPOFOL N/A 08/22/2015   Procedure: COLONOSCOPY WITH PROPOFOL;  Surgeon: [name], MD;  [facility];  Service: Endoscopy;  Laterality: N/A;   ORIF ANKLE FRACTURE  02/27/2012   Procedure: OPEN REDUCTION INTERNAL FIXATION (ORIF) ANKLE FRACTURE;  Surgeon: Colin Rhein, MD;  Location: [facility];  Service: Orthopedics;  Laterality: Left;  ORIF left bimalleolar ankle fracture   SHOULDER SURGERY     LEFT   SIGMOIDOSCOPY  10/08/2018   Externa and Internal Hmorrhoids. Tubular and Tublovillous adenoma removed from transverse colon. Inflammatory pseudopolyps, negative for dysplasia   TONSILLECTOMY     UPPER GASTROINTESTINAL ENDOSCOPY     Family History  Problem Relation Age of Onset   Dementia Mother    Hypertension Mother    Early death Father 89       drowning    Post-traumatic stress disorder Brother    Neuropathy Brother    Allergies as of 08/22/2021       Reactions   Losartan Potassium-hctz Diarrhea   Nsaids Other (See Comments)   On eliquis.    Zoloft [sertraline Hcl] Diarrhea   Benzodiazepines Other (See Comments)   Agitation.        Medication List        Accurate as of August 22, 2021 11:59 PM. If you have any questions, ask your nurse or doctor.          STOP taking these medications    acetaminophen 500 MG tablet Commonly known as: TYLENOL Stopped by: Howard Pouch, DO   atorvastatin 20 MG tablet Commonly known as: LIPITOR Stopped by: Howard Pouch, DO   carbidopa-levodopa 25-100 MG tablet Commonly known as: SINEMET IR Stopped by: Howard Pouch, DO       TAKE these medications    apixaban 5 MG Tabs tablet Commonly known as: ELIQUIS Take 1 tablet (5 mg total) by mouth 2 (two)  times daily.   azaTHIOprine 50 MG tablet Commonly known as: IMURAN Take 200 mg by mouth every morning.   divalproex 125 MG capsule Commonly known as: DEPAKOTE SPRINKLE Take 6 capsules (750 mg total) by mouth every 12 (twelve) hours. What changed: how much to take   docusate sodium 100 MG capsule Commonly known as: Colace Take 1 capsule (100 mg total) by mouth 2 (two) times daily.   DULoxetine 60 MG capsule Commonly known as: CYMBALTA Take 1 capsule (60 mg total) by mouth daily.   furosemide 40 MG tablet Commonly known as: LASIX Take 1 tablet (40 mg total) by mouth daily as needed.   levocetirizine 5 MG tablet Commonly known as: XYZAL Take 5 mg by mouth every evening.  levothyroxine 50 MCG tablet Commonly known as: Synthroid Take 1 tablet (50 mcg total) by mouth daily before breakfast.   Melatonin 10 MG Tabs Take 10 mg by mouth at bedtime.   metoprolol succinate 25 MG 24 hr tablet Commonly known as: TOPROL-XL Take 1 tablet (25 mg total) by mouth daily. Take with or immediately following a meal.   OLANZapine 10 MG tablet Commonly known as: ZYPREXA 1/2 tab in the morning and 1.5 tabs QHS   potassium chloride 10 MEQ tablet Commonly known as: [place]-Con M10 Take 1 tablet (10 mEq total) by mouth daily.   tamsulosin 0.4 MG Caps capsule Commonly known as: FLOMAX Take 1 capsule (0.4 mg total) by mouth daily.   traZODone 50 MG tablet Commonly known as: DESYREL TAKE 1 TABLET BY MOUTH EVERYDAY AT BEDTIME        All past medical history, surgical history, allergies, family history, immunizations andmedications were updated in the EMR today and reviewed under the history and medication portions of their EMR.     ROS: Negative, with the exception of above mentioned in HPI   Objective:  BP 110/76    Pulse 92    Temp 98.3 F (36.8 C) (Oral)    Ht 5' 7"  (1.702 m)    Wt 239 lb 9.6 oz (108.7 kg)    SpO2 97%    BMI 37.53 kg/m  Body mass index is 37.53 kg/m. Physical  Exam Vitals and nursing note reviewed.  Constitutional:      General: He is not in acute distress.    Appearance: Normal appearance. He is obese. He is not ill-appearing, toxic-appearing or diaphoretic.  HENT:     Head: Normocephalic and atraumatic.  Eyes:     General: No scleral icterus.       Right eye: No discharge.        Left eye: No discharge.     Extraocular Movements: Extraocular movements intact.     Pupils: Pupils are equal, round, and reactive to light.  Cardiovascular:     Rate and Rhythm: Normal rate and regular rhythm.  Pulmonary:     Effort: Pulmonary effort is normal. No respiratory distress.     Breath sounds: Normal breath sounds. No wheezing, rhonchi or rales.  Skin:    General: Skin is warm and dry.     Coloration: Skin is not jaundiced or pale.     Findings: No rash.  Neurological:     Mental Status: He is alert. Mental status is at baseline.  Psychiatric:        Attention and Perception: Attention normal.        Mood and Affect: Mood and affect normal.        Speech: Speech is tangential.        Behavior: Behavior is cooperative.        Thought Content: Thought content normal.        Cognition and Memory: Cognition is impaired. Memory is impaired.        Judgment: Judgment normal.     No results found. No results found. No results found for this or any previous visit (from the past 24 hour(s)).  Assessment/Plan: [name] Flesher Sr. is a 70 y.o. male present for OV for  Major depression, recurrent, chronic (HCC)/anxiety//insomnia They have established with [facility]/psychiatry- Dr. Orlean Bradford.  Encouraged melatonin use and maintaining a regular sleep, eat, wake schedule  Trazodone as been increased by neurology team.   Continue Zyprexa, Depakote> per his  psychiatry team> psychiatry team will need to take over management of all his mental health medications. These meds were started by psychiatry during his hospital admission and will need to be managed by  his psychiatry team outpt. This provider bridged him on meds until he was able to establish only QT prolongation-precautions on medication choices Continue cymbalta> refilled      BPH: Stable.  Continue  Flomax 0.4 mg daily.   Acquired thrombophilia/A. Fib/hyperlipidemia/statin therapy/lower extremity edema/CAD/aortic atherosclerosis/morbid obesity/QT prolongation stable Continue   Eliquis 5 mg twice daily DC Lipitor 20 mg daily> advanced dementia and DNR Continue Lasix 40 mg and metoprolol per cardio > managed meds.  Continue  K-Dur daily Caution on choice of psychiatric meds advised with QT prolongation Continue follow-ups per cardiology.   Hypothyroidism, unspecified type stable Continue  levothyroxine 50 mcg daily.   TSH normal 01/11/2021. Labs due next appt in May 2023  Chronic ulcerative pancolitis with complication ([place]) Managed by GI  Influenza vaccine provided today  Reviewed expectations re: course of current medical issues. Discussed self-management of symptoms. Outlined signs and symptoms indicating need for more acute intervention. Patient verbalized understanding and all questions were answered. Patient received an After-Visit Summary.    Orders Placed This Encounter  Procedures   Flu Vaccine QUAD High Dose(Fluad)   Meds ordered this encounter  Medications   tamsulosin (FLOMAX) 0.4 MG CAPS capsule    Sig: Take 1 capsule (0.4 mg total) by mouth daily.    Dispense:  30 capsule    Refill:  11   apixaban (ELIQUIS) 5 MG TABS tablet    Sig: Take 1 tablet (5 mg total) by mouth 2 (two) times daily.    Dispense:  60 tablet    Refill:  11   levothyroxine (SYNTHROID) 50 MCG tablet    Sig: Take 1 tablet (50 mcg total) by mouth daily before breakfast.    Dispense:  90 tablet    Refill:  3   potassium chloride (KLOR-CON M10) 10 MEQ tablet    Sig: Take 1 tablet (10 mEq total) by mouth daily.    Dispense:  90 tablet    Refill:  3   DULoxetine (CYMBALTA) 60 MG  capsule    Sig: Take 1 capsule (60 mg total) by mouth daily.    Dispense:  90 capsule    Refill:  1    Referral Orders  No referral(s) requested today     Note is dictated utilizing voice recognition software. Although note has been proof read prior to signing, occasional typographical errors still can be missed. If any questions arise, please do not hesitate to call for verification.   electronically signed by:  Howard Pouch, DO  [place]

## 2021-08-28 ENCOUNTER — Encounter: Payer: Self-pay | Admitting: Family Medicine

## 2021-09-13 ENCOUNTER — Telehealth: Payer: Self-pay | Admitting: Family Medicine

## 2021-09-13 NOTE — Telephone Encounter (Signed)
Noted  

## 2021-09-13 NOTE — Telephone Encounter (Signed)
Pt wife called to update pharmacy. Pt will no longer be using [facility]. He will change to [facility] for future RX. Updated preferred pharmacy.  [id number] [place] [place]:  [phone] Fax:  [phone]

## 2021-09-21 ENCOUNTER — Telehealth (INDEPENDENT_AMBULATORY_CARE_PROVIDER_SITE_OTHER): Payer: Medicare HMO | Admitting: Family Medicine

## 2021-09-21 DIAGNOSIS — R059 Cough, unspecified: Secondary | ICD-10-CM

## 2021-09-21 MED ORDER — DOXYCYCLINE HYCLATE 100 MG PO TABS: 100.0000 mg | ORAL_TABLET | Freq: Two times a day (BID) | ORAL | 0 refills | Status: DC

## 2021-09-21 MED ORDER — BENZONATATE 100 MG PO CAPS: ORAL_CAPSULE | ORAL | 0 refills | Status: DC

## 2021-09-21 NOTE — Patient Instructions (Signed)
°  HOME CARE TIPS:  -COVID19 testing information: [email or web address].[email or web address]  Most pharmacies also offer testing and home test kits. If the Covid19 test is positive and you desire antiviral treatment, please contact a [place] or schedule a follow up virtual visit through your primary care office or through the [employer] [facility].  Other test to treat options: ConnectRV.is?[email or web address]  -I sent the medication(s) we discussed to your pharmacy: Meds ordered this encounter  Medications   benzonatate (TESSALON PERLES) 100 MG capsule    Sig: 1-2 capsules up to twice daily as needed for cough    Dispense:  30 capsule    Refill:  0   doxycycline (VIBRA-TABS) 100 MG tablet    Sig: Take 1 tablet (100 mg total) by mouth 2 (two) times daily.    Dispense:  14 tablet    Refill:  0    -can use tylenol  if needed for fevers, aches and pains per instructions  -can use nasal saline a few times per day if you have nasal congestion  -stay hydrated, drink plenty of fluids and eat small healthy meals - avoid dairy  -If the Covid test is positive, check out the [facility] website for more information on home care, transmission and treatment for COVID19  -follow up with your doctor in 2-3 days unless improving and feeling better  -stay home while sick, except to seek medical care. If you have COVID19, you will likely be contagious for 7-10 days. Flu or Influenza is likely contagious for about 7 days. Other respiratory viral infections remain contagious for 5-10+ days depending on the virus and many other factors. Wear a good mask that fits snugly (such as N95 or KN95) if around others to reduce the risk of transmission.  It was nice to meet you today, and I really hope you are feeling better soon. I help [place] out with telemedicine visits on Tuesdays and Thursdays and am happy to help if you need a follow up virtual visit on those days. Otherwise, if you  have any concerns or questions following this visit please schedule a follow up visit with your Primary Care doctor or seek care at a local urgent care clinic to avoid delays in care.    Seek in person care or schedule a follow up video visit promptly if your symptoms worsen, new concerns arise or you are not improving with treatment. Call 911 and/or seek emergency care if your symptoms are severe or life threatening.

## 2021-09-21 NOTE — Progress Notes (Signed)
Virtual Visit via Video Note  I connected with  on 09/21/21 at 12:00 PM EST by a video enabled telemedicine application and verified that I am speaking with the correct person using two identifiers.  Location patient: [place] Location provider:work or home office Persons participating in the virtual visit: patient, provider, patient's wife - pt has dementia so wife and caregiver do most of the talking  I discussed the limitations and requested verbal permission for telemedicine visit. The patient expressed understanding and agreed to proceed.   HPI:  Acute telemedicine visit for cough: -Onset: 3-4 weeks ago -Symptoms include: worsening cough this week and poor appetite, sore throat, nasal congestion - now thick mucus -wife is worried about a sinus infection -Denies: CP, SOB, vomiting -Has tried: musinex -Pertinent past medical history: see below -Pertinent medication allergies: Allergies  Allergen Reactions   Losartan Potassium-Hctz Diarrhea   Nsaids Other (See Comments)    On eliquis.    Zoloft [Sertraline Hcl] Diarrhea   Benzodiazepines Other (See Comments)    Agitation.  -COVID-19 vaccine status:  Immunization History  Administered Date(s) Administered   Fluad Quad(high Dose 65+) 08/22/2021   Influenza Split 08/12/2009, 06/16/2015   Influenza, High Dose Seasonal PF 06/01/2016, 05/28/2017, 05/14/2018, 05/23/2020   Influenza,inj,Quad PF,6+ Mos 08/11/2010, 06/13/2012   Influenza-Unspecified 09/10/2017, 05/23/2020   Moderna Sars-Covid-2 Vaccination 12/25/2019, 01/22/2020   PFIZER(Purple Top)SARS-COV-2 Vaccination 05/23/2020   Pneumococcal Conjugate-13 06/02/2014, 05/14/2018   Pneumococcal Polysaccharide-23 06/29/2015   Tdap 03/17/2015, 06/16/2015     ROS: See pertinent positives and negatives per HPI.  Past Medical History:  Diagnosis Date   Acute delirium 07/14/2020   Adenomatous colon polyp 2016   Allergy    AMS (altered mental status) 07/13/2020   Anxiety    Bell  palsy    resolved   Bell's palsy 09/14/2020   CHF (congestive heart failure) (HCC)    Chicken pox    Concussion with no loss of consciousness 07/07/2018   COVID-19 04/04/2021   Depression    Diet-controlled diabetes mellitus ([place])    Eating disorder    Essential hypertension 11/29/2017   Gastro-esophageal reflux disease without esophagitis 09/14/2020   GERD (gastroesophageal reflux disease)    History of vitamin D deficiency    Hyperlipidemia    Hypertension    Hypokalemia    Memory loss    MI (myocardial infarction) ([place])    per pt    Neoplasm of uncertain behavior of skin 09/14/2020   Personal history of colonic polyps 09/14/2020   Ulcerative colitis ([place]) 2005-2006   severe     Past Surgical History:  Procedure Laterality Date   COLONOSCOPY WITH PROPOFOL N/A 07/15/2014   Procedure: COLONOSCOPY WITH PROPOFOL;  Surgeon: [name], MD;  [facility];  Service: Endoscopy;  Laterality: N/A;   COLONOSCOPY WITH PROPOFOL N/A 08/22/2015   Procedure: COLONOSCOPY WITH PROPOFOL;  Surgeon: [name], MD;  [facility];  Service: Endoscopy;  Laterality: N/A;   ORIF ANKLE FRACTURE  02/27/2012   Procedure: OPEN REDUCTION INTERNAL FIXATION (ORIF) ANKLE FRACTURE;  Surgeon: Colin Rhein, MD;  Location: [facility];  Service: Orthopedics;  Laterality: Left;  ORIF left bimalleolar ankle fracture   SHOULDER SURGERY     LEFT   SIGMOIDOSCOPY  10/08/2018   Externa and Internal Hmorrhoids. Tubular and Tublovillous adenoma removed from transverse colon. Inflammatory pseudopolyps, negative for dysplasia   TONSILLECTOMY     UPPER GASTROINTESTINAL ENDOSCOPY       Current Outpatient Medications:  benzonatate (TESSALON PERLES) 100 MG capsule, 1-2 capsules up to twice daily as needed for cough, Disp: 30 capsule, Rfl: 0   doxycycline (VIBRA-TABS) 100 MG tablet, Take 1 tablet (100 mg total) by mouth 2 (two) times daily., Disp: 14 tablet, Rfl: 0   apixaban  (ELIQUIS) 5 MG TABS tablet, Take 1 tablet (5 mg total) by mouth 2 (two) times daily., Disp: 60 tablet, Rfl: 11   azaTHIOprine (IMURAN) 50 MG tablet, Take 200 mg by mouth every morning., Disp: , Rfl:    divalproex (DEPAKOTE SPRINKLE) 125 MG capsule, Take 6 capsules (750 mg total) by mouth every 12 (twelve) hours. (Patient taking differently: Take 500 mg by mouth every 12 (twelve) hours.), Disp: 360 capsule, Rfl: 3   docusate sodium (COLACE) 100 MG capsule, Take 1 capsule (100 mg total) by mouth 2 (two) times daily., Disp: 60 capsule, Rfl: 11   DULoxetine (CYMBALTA) 60 MG capsule, Take 1 capsule (60 mg total) by mouth daily., Disp: 90 capsule, Rfl: 1   furosemide (LASIX) 40 MG tablet, Take 1 tablet (40 mg total) by mouth daily as needed., Disp: 90 tablet, Rfl: 3   levocetirizine (XYZAL) 5 MG tablet, Take 5 mg by mouth every evening., Disp: , Rfl:    levothyroxine (SYNTHROID) 50 MCG tablet, Take 1 tablet (50 mcg total) by mouth daily before breakfast., Disp: 90 tablet, Rfl: 3   Melatonin 10 MG TABS, Take 10 mg by mouth at bedtime., Disp: , Rfl:    metoprolol succinate (TOPROL-XL) 25 MG 24 hr tablet, Take 1 tablet (25 mg total) by mouth daily. Take with or immediately following a meal., Disp: 90 tablet, Rfl: 3   OLANZapine (ZYPREXA) 10 MG tablet, 1/2 tab in the morning and 1.5 tabs QHS, Disp: 180 tablet, Rfl: 0   potassium chloride (KLOR-CON M10) 10 MEQ tablet, Take 1 tablet (10 mEq total) by mouth daily., Disp: 90 tablet, Rfl: 3   tamsulosin (FLOMAX) 0.4 MG CAPS capsule, Take 1 capsule (0.4 mg total) by mouth daily., Disp: 30 capsule, Rfl: 11   traZODone (DESYREL) 50 MG tablet, TAKE 1 TABLET BY MOUTH EVERYDAY AT BEDTIME, Disp: 90 tablet, Rfl: 2  EXAM:  VITALS per patient if applicable: BP 546/56, P 83  GENERAL: alert, oriented, appears well and in no acute distress  HEENT: atraumatic, conjunttiva clear, no obvious abnormalities on inspection of external nose and ears  NECK: normal movements of  the head and neck  LUNGS: on inspection no signs of respiratory distress, breathing rate appears normal, no obvious gross SOB, gasping or wheezing  CV: no obvious cyanosis  MS: moves all visible extremities without noticeable abnormality  PSYCH/NEURO: pleasant  ASSESSMENT AND PLAN:  Discussed the following assessment and plan:  Cough, unspecified type  -we discussed possible serious and likely etiologies, options for evaluation and workup, limitations of telemedicine visit vs in person visit, treatment, treatment risks and precautions. Pt is agreeable to treatment via telemedicine at this moment. Query new acute viral illness, vs  2ndary bacterial illness, covid19 vs other. They have home covid tests and agree to test and contact [facility] pharmacy if positive for antiviral. O/w nasal saline, cough rx and initiation of doxy 168m bid x 7 days.  Advised to seek prompt n person care if worsening, new symptoms arise, or if is not improving with treatment as expected per our conversation of expected course. Discussed options for follow up care. Did let this patient know that I do telemedicine on Tuesdays and Thursdays for [facility]  those are the days I am logged into the system. Advised to schedule follow up visit with PCP, [place] virtual visits or [facility] if any further questions or concerns to avoid delays in care.   I discussed the assessment and treatment plan with the patient. The patient was provided an opportunity to ask questions and all were answered. The patient agreed with the plan and demonstrated an understanding of the instructions.     Lucretia Kern, DO

## 2021-09-28 DIAGNOSIS — M25561 Pain in right knee: Secondary | ICD-10-CM | POA: Diagnosis not present

## 2021-10-17 ENCOUNTER — Ambulatory Visit (INDEPENDENT_AMBULATORY_CARE_PROVIDER_SITE_OTHER): Payer: Medicare HMO | Admitting: Family Medicine

## 2021-10-17 ENCOUNTER — Encounter: Payer: Self-pay | Admitting: Family Medicine

## 2021-10-17 VITALS — BP 124/63 | HR 98 | Temp 98.0°F | Ht 67.0 in | Wt 250.0 lb

## 2021-10-17 DIAGNOSIS — R059 Cough, unspecified: Secondary | ICD-10-CM

## 2021-10-17 DIAGNOSIS — R509 Fever, unspecified: Secondary | ICD-10-CM | POA: Diagnosis not present

## 2021-10-17 DIAGNOSIS — R600 Localized edema: Secondary | ICD-10-CM

## 2021-10-17 LAB — POCT INFLUENZA A/B
Influenza A, POC: NEGATIVE
Influenza B, POC: NEGATIVE

## 2021-10-17 LAB — POC COVID19 BINAXNOW: SARS Coronavirus 2 Ag: NEGATIVE

## 2021-10-17 NOTE — Patient Instructions (Addendum)
Recommend daily weights, limiting sodium, wearing compression socks, elevating feet as much as possible.  Checking lab work today to find a cause for your symptoms.  We will let you know results.   Follow-up with PCP if not improving.

## 2021-10-17 NOTE — Progress Notes (Signed)
Acute Office Visit  Subjective:    Patient ID: [name] PULSIFER Sr., male    DOB: 1950-12-24, 71 y.o.   MRN: [id number]  CC: fever, cough   HPI Patient is in today for fever, cough.   Patient has dementia at baseline so wife is present as historian.  Wife states that for the past 2 to 3 days patient has been slightly more confused than normal, complaining of body aches, coughing with occasional sputum production, vomited once this morning, chest congestion, fevers up to 101.8 F, urine that is dark and has a strong odor, fatigue, poor appetite and stating nothing tastes normal.  She denies any recent sick contacts.  Patient has not had any worsening dyspnea, chills, wheezing headaches, diarrhea.  Patient is alert but disoriented, sitting upright in chair talking about things not related to conversation.  When asked how he feels he replies " I feel wonderful today."    Past Medical History:  Diagnosis Date   Acute delirium 07/14/2020   Adenomatous colon polyp 2016   Allergy    AMS (altered mental status) 07/13/2020   Anxiety    Bell palsy    resolved   Bell's palsy 09/14/2020   CHF (congestive heart failure) ([place])    Chicken pox    Concussion with no loss of consciousness 07/07/2018   COVID-19 04/04/2021   Depression    Diet-controlled diabetes mellitus ([place])    Eating disorder    Essential hypertension 11/29/2017   Gastro-esophageal reflux disease without esophagitis 09/14/2020   GERD (gastroesophageal reflux disease)    History of vitamin D deficiency    Hyperlipidemia    Hypertension    Hypokalemia    Memory loss    MI (myocardial infarction) ([place])    per pt    Neoplasm of uncertain behavior of skin 09/14/2020   Personal history of colonic polyps 09/14/2020   Ulcerative colitis ([place]) 2005-2006   severe     Past Surgical History:  Procedure Laterality Date   COLONOSCOPY WITH PROPOFOL N/A 07/15/2014   Procedure: COLONOSCOPY WITH PROPOFOL;  Surgeon: [name], MD;   Location: [facility];  Service: Endoscopy;  Laterality: N/A;   COLONOSCOPY WITH PROPOFOL N/A 08/22/2015   Procedure: COLONOSCOPY WITH PROPOFOL;  Surgeon: [name], MD;  Location: [facility];  Service: Endoscopy;  Laterality: N/A;   ORIF ANKLE FRACTURE  02/27/2012   Procedure: OPEN REDUCTION INTERNAL FIXATION (ORIF) ANKLE FRACTURE;  Surgeon: Colin Rhein, MD;  Location: [facility];  Service: Orthopedics;  Laterality: Left;  ORIF left bimalleolar ankle fracture   SHOULDER SURGERY     LEFT   SIGMOIDOSCOPY  10/08/2018   Externa and Internal Hmorrhoids. Tubular and Tublovillous adenoma removed from transverse colon. Inflammatory pseudopolyps, negative for dysplasia   TONSILLECTOMY     UPPER GASTROINTESTINAL ENDOSCOPY      Family History  Problem Relation Age of Onset   Dementia Mother    Hypertension Mother    Early death Father 63       drowning    Post-traumatic stress disorder Brother    Neuropathy Brother     Social History   Socioeconomic History   Marital status: Married    Spouse name: [name]   Number of children: 2   Years of education: 12   Highest education level: Some college, no degree  Occupational History   Occupation: Retired  Tobacco Use   Smoking status: Former    Packs/day: 1.00    Years:  19.00    Pack years: 19.00    Types: Cigarettes    Quit date: 02/25/1982    Years since quitting: 39.6   Smokeless tobacco: Former    Quit date: 1990  Scientific laboratory technician Use: Never used  Substance and Sexual Activity   Alcohol use: Yes    Comment: occ   Drug use: No   Sexual activity: Yes    Partners: Female  Other Topics Concern   Not on file  Social History Narrative   Lives at home with wife [name].   College-educated. Works in Public house manager.    Right handed   Social Determinants of Health   Financial Resource Strain: Low Risk    Difficulty of Paying Living Expenses: Not hard at all  Food Insecurity: No Food  Insecurity   Worried About Charity fundraiser in the Last Year: Never true   Ran Out of Food in the Last Year: Never true  Transportation Needs: No Transportation Needs   Lack of Transportation (Medical): No   Lack of Transportation (Non-Medical): No  Physical Activity: Sufficiently Active   Days of Exercise per Week: 7 days   Minutes of Exercise per Session: 60 min  Stress: No Stress Concern Present   Feeling of Stress : Not at all  Social Connections: Moderately Integrated   Frequency of Communication with Friends and Family: More than three times a week   Frequency of Social Gatherings with Friends and Family: More than three times a week   Attends Religious Services: More than 4 times per year   Active Member of [employer] or Organizations: No   Attends Archivist Meetings: Never   Marital Status: Married  Human resources officer Violence: Not At Risk   Fear of Current or Ex-Partner: No   Emotionally Abused: No   Physically Abused: No   Sexually Abused: No    Outpatient Medications Prior to Visit  Medication Sig Dispense Refill   apixaban (ELIQUIS) 5 MG TABS tablet Take 1 tablet (5 mg total) by mouth 2 (two) times daily. 60 tablet 11   azaTHIOprine (IMURAN) 50 MG tablet Take 200 mg by mouth every morning.     benzonatate (TESSALON PERLES) 100 MG capsule 1-2 capsules up to twice daily as needed for cough 30 capsule 0   divalproex (DEPAKOTE SPRINKLE) 125 MG capsule Take 6 capsules (750 mg total) by mouth every 12 (twelve) hours. (Patient taking differently: Take 500 mg by mouth every 12 (twelve) hours.) 360 capsule 3   docusate sodium (COLACE) 100 MG capsule Take 1 capsule (100 mg total) by mouth 2 (two) times daily. 60 capsule 11   doxycycline (VIBRA-TABS) 100 MG tablet Take 1 tablet (100 mg total) by mouth 2 (two) times daily. 14 tablet 0   DULoxetine (CYMBALTA) 60 MG capsule Take 1 capsule (60 mg total) by mouth daily. 90 capsule 1   furosemide (LASIX) 40 MG tablet Take 1 tablet  (40 mg total) by mouth daily as needed. 90 tablet 3   levocetirizine (XYZAL) 5 MG tablet Take 5 mg by mouth every evening.     levothyroxine (SYNTHROID) 50 MCG tablet Take 1 tablet (50 mcg total) by mouth daily before breakfast. 90 tablet 3   Melatonin 10 MG TABS Take 10 mg by mouth at bedtime.     metoprolol succinate (TOPROL-XL) 25 MG 24 hr tablet Take 1 tablet (25 mg total) by mouth daily. Take with or immediately following a meal. 90 tablet 3  OLANZapine (ZYPREXA) 10 MG tablet 1/2 tab in the morning and 1.5 tabs QHS 180 tablet 0   potassium chloride (KLOR-CON M10) 10 MEQ tablet Take 1 tablet (10 mEq total) by mouth daily. 90 tablet 3   tamsulosin (FLOMAX) 0.4 MG CAPS capsule Take 1 capsule (0.4 mg total) by mouth daily. 30 capsule 11   traZODone (DESYREL) 50 MG tablet TAKE 1 TABLET BY MOUTH EVERYDAY AT BEDTIME 90 tablet 2   No facility-administered medications prior to visit.    Allergies  Allergen Reactions   Losartan Potassium-Hctz Diarrhea   Nsaids Other (See Comments)    On eliquis.    Zoloft [Sertraline Hcl] Diarrhea   Benzodiazepines Other (See Comments)    Agitation.    Review of Systems All review of systems negative except what is listed in the HPI     Objective:    Physical Exam Vitals reviewed.  Constitutional:      Appearance: Normal appearance. He is obese.  HENT:     Head: Normocephalic and atraumatic.     Right Ear: Tympanic membrane normal.     Left Ear: Tympanic membrane normal.  Cardiovascular:     Rate and Rhythm: Normal rate and regular rhythm.  Pulmonary:     Effort: Pulmonary effort is normal.     Breath sounds: Normal breath sounds.  Abdominal:     Palpations: Abdomen is soft.  Musculoskeletal:     Cervical back: Normal range of motion and neck supple.     Right lower leg: Edema present.     Left lower leg: Edema present.  Skin:    General: Skin is warm and dry.     Findings: No erythema.  Neurological:     Mental Status: He is alert. He  is disoriented.    BP 124/63    Pulse 98    Temp 98 F (36.7 C)    Ht 5' 7"  (1.702 m)    Wt 250 lb (113.4 kg)    SpO2 96%    BMI 39.16 kg/m  Wt Readings from Last 3 Encounters:  10/17/21 250 lb (113.4 kg)  08/22/21 239 lb 9.6 oz (108.7 kg)  08/08/21 240 lb 6.4 oz (109 kg)    Health Maintenance Due  Topic Date Due   FOOT EXAM  Never done   OPHTHALMOLOGY EXAM  Never done   URINE MICROALBUMIN  Never done   Hepatitis C Screening  Never done   Zoster Vaccines- Shingrix (1 of 2) Never done   COLONOSCOPY (Pts 45-48yr Insurance coverage will need to be confirmed)  10/09/2019   COVID-19 Vaccine (4 - Booster) 07/18/2020   HEMOGLOBIN A1C  09/16/2021    There are no preventive care reminders to display for this patient.   Lab Results  Component Value Date   TSH 2.482 01/11/2021   Lab Results  Component Value Date   WBC 5.0 03/22/2021   HGB 14.9 03/22/2021   HCT 43.7 03/22/2021   MCV 103.8 (H) 03/22/2021   PLT 156 03/22/2021   Lab Results  Component Value Date   NA 141 03/22/2021   K 3.7 03/22/2021   CO2 29 03/22/2021   GLUCOSE 100 (H) 03/22/2021   BUN 14 03/22/2021   CREATININE 0.81 03/22/2021   BILITOT 1.5 (H) 03/21/2021   ALKPHOS 42 03/21/2021   AST 20 03/21/2021   ALT 12 03/21/2021   PROT 5.8 (L) 03/21/2021   ALBUMIN 3.1 (L) 03/21/2021   CALCIUM 8.9 03/22/2021   ANIONGAP 8 03/22/2021  GFR 85.81 12/21/2020   Lab Results  Component Value Date   CHOL 188 02/18/2019   Lab Results  Component Value Date   HDL 44 02/18/2019   Lab Results  Component Value Date   LDLCALC 111 (H) 02/18/2019   Lab Results  Component Value Date   TRIG 67 03/14/2021   Lab Results  Component Value Date   CHOLHDL 4.3 02/18/2019   Lab Results  Component Value Date   HGBA1C 4.7 (L) 03/16/2021       Assessment & Plan:   1. Bilateral lower extremity edema Labs today. Continue lasix 40 mg daily, avoid sodium, wear compression socks, elevate legs when sitting. If BNP elevated  and kidney function stable, may add a few days of extra lasix. Recommend daily weights - CBC - Comprehensive metabolic panel - Brain natriuretic peptide  2. Fever, unspecified fever cause 3. Cough, unspecified type Unable to urinate today - sent with cup to bring back sample. Labs today. Flu and COVID negative. Will update them with results and any changes to plan. Continue supportive measures including rest, hydration, humidifier use, steam showers, warm compresses to sinuses, warm liquids with lemon and honey, and over-the-counter cough, cold, and analgesics as needed.  Patient/wife aware of signs/symptoms requiring further/urgent evaluation.   - CBC - Comprehensive metabolic panel - Brain natriuretic peptide - POCT Influenza A/B - POC COVID-19 [employer]Now   Follow-up with PCP if not improving or results inconclusive.   Terrilyn Saver, NP

## 2021-10-18 ENCOUNTER — Other Ambulatory Visit: Payer: Self-pay | Admitting: *Deleted

## 2021-10-18 ENCOUNTER — Other Ambulatory Visit: Payer: Self-pay

## 2021-10-18 ENCOUNTER — Ambulatory Visit: Payer: Medicare HMO

## 2021-10-18 DIAGNOSIS — R059 Cough, unspecified: Secondary | ICD-10-CM | POA: Diagnosis not present

## 2021-10-18 DIAGNOSIS — R600 Localized edema: Secondary | ICD-10-CM | POA: Diagnosis not present

## 2021-10-18 DIAGNOSIS — R509 Fever, unspecified: Secondary | ICD-10-CM

## 2021-10-18 LAB — CBC
HCT: 42.5 % (ref 39.0–52.0)
Hemoglobin: 14.5 g/dL (ref 13.0–17.0)
MCHC: 34 g/dL (ref 30.0–36.0)
MCV: 95.4 fl (ref 78.0–100.0)
Platelets: 159 10*3/uL (ref 150.0–400.0)
RBC: 4.46 Mil/uL (ref 4.22–5.81)
RDW: 16.3 % — ABNORMAL HIGH (ref 11.5–15.5)
WBC: 8 10*3/uL (ref 4.0–10.5)

## 2021-10-18 LAB — BRAIN NATRIURETIC PEPTIDE: Pro B Natriuretic peptide (BNP): 37 pg/mL (ref 0.0–100.0)

## 2021-10-18 NOTE — Addendum Note (Signed)
Addended by: Octaviano Glow on: 10/18/2021 03:05 PM   Modules accepted: Orders

## 2021-10-19 LAB — COMPREHENSIVE METABOLIC PANEL
ALT: 15 U/L (ref 0–53)
AST: 21 U/L (ref 0–37)
Albumin: 3.7 g/dL (ref 3.5–5.2)
Alkaline Phosphatase: 45 U/L (ref 39–117)
BUN: 7 mg/dL (ref 6–23)
CO2: 32 mEq/L (ref 19–32)
Calcium: 8.8 mg/dL (ref 8.4–10.5)
Chloride: 93 mEq/L — ABNORMAL LOW (ref 96–112)
Creatinine, Ser: 0.86 mg/dL (ref 0.40–1.50)
GFR: 86.49 mL/min (ref >60.00)
Glucose, Bld: 112 mg/dL — ABNORMAL HIGH (ref 70–99)
Potassium: 4 mEq/L (ref 3.5–5.1)
Sodium: 133 mEq/L — ABNORMAL LOW (ref 135–145)
Total Bilirubin: 0.5 mg/dL (ref 0.2–1.2)
Total Protein: 6 g/dL (ref 6.0–8.3)

## 2021-10-19 NOTE — Progress Notes (Signed)
Labs look good overall. Sodium and chloride are just slightly low. Please make sure you are staying well hydrated and eating regularly.  BNP (fluid marker) is negative I would recommend following up with your primary doctor next week and rechecking these labs as well as making sure you are improving.

## 2021-10-20 LAB — URINALYSIS
Bilirubin Urine: NEGATIVE
Glucose, UA: NEGATIVE
Hgb urine dipstick: NEGATIVE
Ketones, ur: NEGATIVE
Leukocytes,Ua: NEGATIVE
Nitrite: NEGATIVE
Protein, ur: NEGATIVE
Specific Gravity, Urine: 1.014 (ref 1.001–1.035)
pH: 7 (ref 5.0–8.0)

## 2021-10-20 LAB — URINE CULTURE
MICRO NUMBER:: 12984967
SPECIMEN QUALITY:: ADEQUATE

## 2021-10-21 DIAGNOSIS — R079 Chest pain, unspecified: Secondary | ICD-10-CM | POA: Diagnosis not present

## 2021-10-21 DIAGNOSIS — R0789 Other chest pain: Secondary | ICD-10-CM | POA: Diagnosis not present

## 2021-10-27 ENCOUNTER — Other Ambulatory Visit: Payer: Self-pay | Admitting: Cardiovascular Disease

## 2021-11-13 ENCOUNTER — Telehealth: Payer: Self-pay | Admitting: Family Medicine

## 2021-11-13 MED ORDER — BENZONATATE 100 MG PO CAPS: ORAL_CAPSULE | ORAL | 0 refills | Status: DC

## 2021-11-13 NOTE — Telephone Encounter (Signed)
Spoke with pt regarding medication and instructions.  

## 2021-11-13 NOTE — Telephone Encounter (Signed)
Please advise 

## 2021-11-13 NOTE — Telephone Encounter (Signed)
Please call patient's wife and inform her I did call in a Tessalon Perles.  However in the future if needing a prescribed medication, we will need to at least see virtually. ?

## 2021-11-13 NOTE — Telephone Encounter (Signed)
FYI: ?Pt wife said husband has bad cough can Dr. Raliegh Ip prescribe pt with cough pearls. ? ?Informed wife that pt needs to be seen first in order to be treated. I offered to set appt for pt this week for OV. Wife declined said husband has a hard time walking. I offered to schedule for VV. Wife declined.  ? ?I'll send note back, can't guarantee anything ?

## 2021-11-14 ENCOUNTER — Emergency Department (HOSPITAL_COMMUNITY): Payer: Medicare HMO

## 2021-11-14 ENCOUNTER — Encounter (HOSPITAL_COMMUNITY): Payer: Self-pay

## 2021-11-14 ENCOUNTER — Telehealth: Payer: Self-pay

## 2021-11-14 ENCOUNTER — Other Ambulatory Visit: Payer: Self-pay

## 2021-11-14 ENCOUNTER — Emergency Department (HOSPITAL_COMMUNITY)
Admission: EM | Admit: 2021-11-14 | Discharge: 2021-11-14 | Disposition: A | Discharge status: Home / Self Care | Payer: Medicare HMO | Attending: Emergency Medicine | Admitting: Emergency Medicine

## 2021-11-14 DIAGNOSIS — Z20822 Contact with and (suspected) exposure to covid-19: Secondary | ICD-10-CM | POA: Insufficient documentation

## 2021-11-14 DIAGNOSIS — I4891 Unspecified atrial fibrillation: Secondary | ICD-10-CM | POA: Diagnosis not present

## 2021-11-14 DIAGNOSIS — F0390 Unspecified dementia without behavioral disturbance: Secondary | ICD-10-CM | POA: Diagnosis not present

## 2021-11-14 DIAGNOSIS — I10 Essential (primary) hypertension: Secondary | ICD-10-CM | POA: Diagnosis not present

## 2021-11-14 DIAGNOSIS — R410 Disorientation, unspecified: Secondary | ICD-10-CM | POA: Diagnosis not present

## 2021-11-14 DIAGNOSIS — R442 Other hallucinations: Secondary | ICD-10-CM | POA: Diagnosis not present

## 2021-11-14 DIAGNOSIS — Z7901 Long term (current) use of anticoagulants: Secondary | ICD-10-CM | POA: Diagnosis not present

## 2021-11-14 DIAGNOSIS — R4182 Altered mental status, unspecified: Secondary | ICD-10-CM | POA: Diagnosis not present

## 2021-11-14 DIAGNOSIS — I509 Heart failure, unspecified: Secondary | ICD-10-CM | POA: Insufficient documentation

## 2021-11-14 DIAGNOSIS — I517 Cardiomegaly: Secondary | ICD-10-CM | POA: Diagnosis not present

## 2021-11-14 DIAGNOSIS — Z79899 Other long term (current) drug therapy: Secondary | ICD-10-CM | POA: Insufficient documentation

## 2021-11-14 DIAGNOSIS — R0902 Hypoxemia: Secondary | ICD-10-CM | POA: Diagnosis not present

## 2021-11-14 DIAGNOSIS — N390 Urinary tract infection, site not specified: Secondary | ICD-10-CM | POA: Diagnosis not present

## 2021-11-14 DIAGNOSIS — E119 Type 2 diabetes mellitus without complications: Secondary | ICD-10-CM | POA: Diagnosis not present

## 2021-11-14 DIAGNOSIS — I110 Hypertensive heart disease with heart failure: Secondary | ICD-10-CM | POA: Insufficient documentation

## 2021-11-14 DIAGNOSIS — R29818 Other symptoms and signs involving the nervous system: Secondary | ICD-10-CM | POA: Diagnosis not present

## 2021-11-14 DIAGNOSIS — R82998 Other abnormal findings in urine: Secondary | ICD-10-CM | POA: Diagnosis not present

## 2021-11-14 LAB — URINALYSIS, ROUTINE W REFLEX MICROSCOPIC
Bilirubin Urine: NEGATIVE
Glucose, UA: NEGATIVE mg/dL
Hgb urine dipstick: NEGATIVE
Ketones, ur: 20 mg/dL — AB
Leukocytes,Ua: NEGATIVE
Nitrite: NEGATIVE
Protein, ur: NEGATIVE mg/dL
Specific Gravity, Urine: 1.023 (ref 1.005–1.030)
pH: 6 (ref 5.0–8.0)

## 2021-11-14 LAB — DIFFERENTIAL
Abs Immature Granulocytes: 0.11 10*3/uL — ABNORMAL HIGH (ref 0.00–0.07)
Basophils Absolute: 0 10*3/uL (ref 0.0–0.1)
Basophils Relative: 1 %
Eosinophils Absolute: 0.1 10*3/uL (ref 0.0–0.5)
Eosinophils Relative: 1 %
Immature Granulocytes: 2 %
Lymphocytes Relative: 10 %
Lymphs Abs: 0.7 10*3/uL (ref 0.7–4.0)
Monocytes Absolute: 0.7 10*3/uL (ref 0.1–1.0)
Monocytes Relative: 10 %
Neutro Abs: 5.3 10*3/uL (ref 1.7–7.7)
Neutrophils Relative %: 76 %

## 2021-11-14 LAB — COMPREHENSIVE METABOLIC PANEL
ALT: 17 U/L (ref 0–44)
AST: 33 U/L (ref 15–41)
Albumin: 3.2 g/dL — ABNORMAL LOW (ref 3.5–5.0)
Alkaline Phosphatase: 43 U/L (ref 38–126)
Anion gap: 9 (ref 5–15)
BUN: 10 mg/dL (ref 8–23)
CO2: 27 mmol/L (ref 22–32)
Calcium: 8.8 mg/dL — ABNORMAL LOW (ref 8.9–10.3)
Chloride: 100 mmol/L (ref 98–111)
Creatinine, Ser: 0.86 mg/dL (ref 0.61–1.24)
GFR, Estimated: >60 mL/min (ref >60)
Glucose, Bld: 94 mg/dL (ref 70–99)
Potassium: 4 mmol/L (ref 3.5–5.1)
Sodium: 136 mmol/L (ref 135–145)
Total Bilirubin: 1.1 mg/dL (ref 0.3–1.2)
Total Protein: 6.5 g/dL (ref 6.5–8.1)

## 2021-11-14 LAB — RAPID URINE DRUG SCREEN, HOSP PERFORMED
Amphetamines: NOT DETECTED
Barbiturates: NOT DETECTED
Benzodiazepines: NOT DETECTED
Cocaine: NOT DETECTED
Opiates: POSITIVE — AB
Tetrahydrocannabinol: NOT DETECTED

## 2021-11-14 LAB — CBC
HCT: 43 % (ref 39.0–52.0)
Hemoglobin: 14.6 g/dL (ref 13.0–17.0)
MCH: 32.6 pg (ref 26.0–34.0)
MCHC: 34 g/dL (ref 30.0–36.0)
MCV: 96 fL (ref 80.0–100.0)
Platelets: 152 10*3/uL (ref 150–400)
RBC: 4.48 MIL/uL (ref 4.22–5.81)
RDW: 16.2 % — ABNORMAL HIGH (ref 11.5–15.5)
WBC: 7 10*3/uL (ref 4.0–10.5)
nRBC: 0 % (ref 0.0–0.2)

## 2021-11-14 LAB — PROTIME-INR
INR: 1.2 (ref 0.8–1.2)
Prothrombin Time: 15.5 seconds — ABNORMAL HIGH (ref 11.4–15.2)

## 2021-11-14 LAB — RESP PANEL BY RT-PCR (FLU A&B, COVID) ARPGX2
Influenza A by PCR: NEGATIVE
Influenza B by PCR: NEGATIVE
SARS Coronavirus 2 by RT PCR: NEGATIVE

## 2021-11-14 LAB — APTT: aPTT: 34 seconds (ref 24–36)

## 2021-11-14 LAB — VALPROIC ACID LEVEL: Valproic Acid Lvl: 57 ug/mL (ref 50.0–100.0)

## 2021-11-14 MED ORDER — SODIUM CHLORIDE 0.9 % IV SOLN
100.0000 mL/h | INTRAVENOUS | Status: DC
  Administered 2021-11-14: 100 mL/h via INTRAVENOUS

## 2021-11-14 MED ORDER — SODIUM CHLORIDE 0.9 % IV BOLUS
500.0000 mL | Freq: Once | INTRAVENOUS | Status: AC
  Administered 2021-11-14: 500 mL via INTRAVENOUS

## 2021-11-14 NOTE — Discharge Instructions (Signed)
The test today ED did not show any signs of acute infection.  The CT scan was normal.  Follow-up with his primary care doctor for further evaluation if the symptoms persist ?

## 2021-11-14 NOTE — ED Provider Notes (Signed)
[facility] DEPT Provider Note   CSN: [id number] Arrival date & time: 11/14/21  1539     History  Chief Complaint  Patient presents with   Altered Mental Status    [name] Sr. is a 71 y.o. male.   Altered Mental Status Associated symptoms: no abdominal pain and no fever     Pt has history of dementia, congestive heart failure, depression, hypertension, hyperlipidemia, diabetes, gastroesophageal reflux disease.  At baseline. The patient has difficulty communicating due to his dementia.  Family members were concerned because he seems more confused than usual .  Family have also noticed an abnormal odor to his urine.  Patient himself denies any complaints.  He is unable to tell me why he is here.  When asked  about what brought him to the ED, he tells me about the work that he was doing around the house today Additional history provided by wife who is at the bedside.  Pt has been much more confused.  He did not sleep well last night.  He has fallen a few times.  He is not making a lot of sense at times.   Home Medications Prior to Admission medications   Medication Sig Start Date End Date Taking? Authorizing Provider  apixaban (ELIQUIS) 5 MG TABS tablet Take 1 tablet (5 mg total) by mouth 2 (two) times daily. 08/22/21 08/17/22  [name], [name], DO  azaTHIOprine (IMURAN) 50 MG tablet Take 200 mg by mouth every morning.    [provider]  benzonatate (TESSALON PERLES) 100 MG capsule 1-2 capsules up to twice daily as needed for cough 11/13/21   [name], [name], DO  divalproex (DEPAKOTE SPRINKLE) 125 MG capsule Take 6 capsules (750 mg total) by mouth every 12 (twelve) hours. Patient taking differently: Take 500 mg by mouth every 12 (twelve) hours. 04/04/21   [name], [name], DO  docusate sodium (COLACE) 100 MG capsule Take 1 capsule (100 mg total) by mouth 2 (two) times daily. 03/01/21 03/01/22  [name], [name], DO  doxycycline (VIBRA-TABS) 100 MG  tablet Take 1 tablet (100 mg total) by mouth 2 (two) times daily. 09/21/21   Lucretia Kern, DO  DULoxetine (CYMBALTA) 60 MG capsule Take 1 capsule (60 mg total) by mouth daily. 08/22/21   [name], [name], DO  furosemide (LASIX) 40 MG tablet Take 1 tablet (40 mg total) by mouth daily as needed. 07/31/21   Troy Sine, MD  levocetirizine (XYZAL) 5 MG tablet Take 5 mg by mouth every evening.    [provider]  levothyroxine (SYNTHROID) 50 MCG tablet Take 1 tablet (50 mcg total) by mouth daily before breakfast. 08/22/21 11/20/21  [name], [name], DO  Melatonin 10 MG TABS Take 10 mg by mouth at bedtime.    [provider]  metoprolol succinate (TOPROL-XL) 25 MG 24 hr tablet TAKE 1 TABLET BY MOUTH DAILY. TAKE WITH OR IMMEDIATELY FOLLOWING A MEAL. 10/27/21   Troy Sine, MD  OLANZapine (ZYPREXA) 10 MG tablet 1/2 tab in the morning and 1.5 tabs QHS 04/04/21   [name], [name], DO  potassium chloride (KLOR-CON M10) 10 MEQ tablet Take 1 tablet (10 mEq total) by mouth daily. 08/22/21   [name], [name], DO  tamsulosin (FLOMAX) 0.4 MG CAPS capsule Take 1 capsule (0.4 mg total) by mouth daily. 08/22/21   [name], [name], DO  traZODone (DESYREL) 50 MG tablet TAKE 1 TABLET BY MOUTH EVERYDAY AT BEDTIME 01/09/21  Cameron Sprang, MD      Allergies    Losartan potassium-hctz, Nsaids, Zoloft [sertraline hcl], and Benzodiazepines    Review of Systems   Review of Systems  Constitutional:  Negative for fever.  Respiratory:  Negative for shortness of breath.   Gastrointestinal:  Negative for abdominal pain.   Physical Exam Updated Vital Signs BP 131/87    Pulse 88    Temp 98.5 F (36.9 C) (Oral)    Resp 17    Ht 1.702 m (5' 7" )    Wt 108.9 kg    SpO2 97%    BMI 37.59 kg/m  Physical Exam Vitals and nursing note reviewed.  Constitutional:      Appearance: He is well-developed. He is not diaphoretic.  HENT:     Head: Normocephalic and atraumatic.     Right Ear: External ear normal.      Left Ear: External ear normal.  Eyes:     General: No scleral icterus.       Right eye: No discharge.        Left eye: No discharge.     Conjunctiva/sclera: Conjunctivae normal.  Neck:     Trachea: No tracheal deviation.  Cardiovascular:     Rate and Rhythm: Normal rate and regular rhythm.  Pulmonary:     Effort: Pulmonary effort is normal. No respiratory distress.     Breath sounds: Normal breath sounds. No stridor. No wheezing or rales.  Abdominal:     General: Bowel sounds are normal. There is no distension.     Palpations: Abdomen is soft.     Tenderness: There is no abdominal tenderness. There is no guarding or rebound.  Musculoskeletal:        General: No tenderness or deformity.     Cervical back: Neck supple.  Skin:    General: Skin is warm and dry.     Findings: No rash.  Neurological:     General: No focal deficit present.     Mental Status: He is alert.     Cranial Nerves: No cranial nerve deficit (no facial droop, extraocular movements intact, no slurred speech).     Sensory: No sensory deficit.     Motor: No abnormal muscle tone or seizure activity.     Coordination: Coordination normal.     Comments: Follows commands, lifts both arms, equal grip strength, able to lift both legs, no slurred speech, confused  Psychiatric:        Mood and Affect: Mood normal.    ED Results / Procedures / Treatments   Labs (all labs ordered are listed, but only abnormal results are displayed) Labs Reviewed  PROTIME-INR - Abnormal; Notable for the following components:      Result Value   Prothrombin Time 15.5 (*)    All other components within normal limits  CBC - Abnormal; Notable for the following components:   RDW 16.2 (*)    All other components within normal limits  DIFFERENTIAL - Abnormal; Notable for the following components:   Abs Immature Granulocytes 0.11 (*)    All other components within normal limits  COMPREHENSIVE METABOLIC PANEL - Abnormal; Notable for the  following components:   Calcium 8.8 (*)    Albumin 3.2 (*)    All other components within normal limits  RAPID URINE DRUG SCREEN, HOSP PERFORMED - Abnormal; Notable for the following components:   Opiates POSITIVE (*)    All other components within normal limits  URINALYSIS, ROUTINE W REFLEX  MICROSCOPIC - Abnormal; Notable for the following components:   Color, Urine AMBER (*)    Ketones, ur 20 (*)    All other components within normal limits  RESP PANEL BY RT-PCR (FLU A&B, COVID) ARPGX2  APTT  VALPROIC ACID LEVEL    EKG EKG Interpretation  Date/Time:  Tuesday November 14 2021 15:53:36 EST Ventricular Rate:  92 PR Interval:    QRS Duration: 123 QT Interval:  384 QTC Calculation: 475 R Axis:   -9 Text Interpretation: Atrial fibrillation Nonspecific intraventricular conduction delay No significant change since last tracing Confirmed by Dorie Rank [id number]) on 11/14/2021 4:40:35 PM  Radiology CT HEAD WO CONTRAST  Result Date: 11/14/2021 CLINICAL DATA:  Acute neuro deficit.  Rule out stroke. EXAM: CT HEAD WITHOUT CONTRAST TECHNIQUE: Contiguous axial images were obtained from the base of the skull through the vertex without intravenous contrast. RADIATION DOSE REDUCTION: This exam was performed according to the departmental dose-optimization program which includes automated exposure control, adjustment of the mA and/or kV according to patient size and/or use of iterative reconstruction technique. COMPARISON:  CT head 03/14/2021 FINDINGS: Brain: Generalized atrophy unchanged. This is most prominent in the hippocampus. Negative for hydrocephalus. Negative for acute infarct, hemorrhage, mass. Vascular: Negative for hyperdense vessel Skull: Negative Sinuses/Orbits: Paranasal sinuses clear.  Negative orbit Other: None IMPRESSION: Cerebral atrophy.  No acute abnormality. Electronically Signed   By: Franchot Gallo M.D.   On: 11/14/2021 16:38   DG Chest Portable 1 View  Result Date: 11/14/2021 CLINICAL  DATA:  Confusion.  Dementia. EXAM: PORTABLE CHEST 1 VIEW COMPARISON:  03/14/2021 FINDINGS: Numerous leads and wires project over the chest. Apical lordotic positioning. Midline trachea. Moderate cardiomegaly. No pleural effusion or pneumothorax. Pulmonary interstitial thickening is moderate. Felt to be slightly increased compared to the prior. No lobar consolidation. IMPRESSION: Cardiomegaly with moderate pulmonary interstitial thickening. Favored to be related to AP portable technique and history of smoking. Mild pulmonary venous congestion cannot be excluded. No overt congestive failure and no evidence of pneumonia. Electronically Signed   By: Abigail Miyamoto M.D.   On: 11/14/2021 16:42    Procedures Procedures    Medications Ordered in ED Medications  sodium chloride 0.9 % bolus 500 mL (0 mLs Intravenous Stopped 11/14/21 1825)    Followed by  0.9 %  sodium chloride infusion (100 mL/hr Intravenous New Bag/Given 11/14/21 1825)    ED Course/ Medical Decision Making/ A&P Clinical Course as of 11/14/21 2114  Tue Nov 14, 2021  [address] [place] Head CT without acute findings [JK]  1919 Resp Panel by RT-PCR (Flu A&B, Covid) Nasopharyngeal Swab COVID and flu negative [JK]  1919 Comprehensive metabolic panel(!) No acute electrolyte abnormalities [JK]  1920 CBC(!) CBC normal [JK]  2035 Urinalysis, Routine w reflex microscopic Urine, Catheterized(!) Urinalysis without signs of infection [JK]    Clinical Course User Index [JK] Dorie Rank, MD                           Medical Decision Making Amount and/or Complexity of Data Reviewed Labs: ordered. Decision-making details documented in ED Course. Radiology: ordered. Decision-making details documented in ED Course.  Risk Prescription drug management.   Patient presented to the ED for evaluation of altered mental status.  Wife states patient seemed a lot more confused earlier.  Concerned about the possibility of acute infection dehydration  stroke.  Laboratory tests and ED work-up are reassuring.  No signs of anemia.  No dehydration.  Urinalysis without signs of infection.  Head CT did not show any acute findings.  While the patient was in the ED he has been able to get up and stand and walk around.  I do not see any focal neurologic deficits.  I doubt stroke at this time.  Suspect the patient's symptoms are related to his dementia.  Wife states he is at his baseline now.  Comfortable with discharge.  At this point I do not feel he requires admission for further work-up and evaluation.  Evaluation and diagnostic testing in the emergency department does not suggest an emergent condition requiring admission or immediate intervention beyond what has been performed at this time.  The patient is safe for discharge and has been instructed to return immediately for worsening symptoms, change in symptoms or any other concerns.         Final Clinical Impression(s) / ED Diagnoses Final diagnoses:  Dementia, unspecified dementia severity, unspecified dementia type, unspecified whether behavioral, psychotic, or mood disturbance or anxiety [facility])    Rx / DC Orders ED Discharge Orders     None         Dorie Rank, MD 11/14/21 2114

## 2021-11-14 NOTE — Telephone Encounter (Signed)
Spoke with pt caregiver Seth Bake, and sched pt for 1130 Friday.  ?

## 2021-11-14 NOTE — Telephone Encounter (Signed)
Patient wife (DPR) calling in regards to patient's health.  She states he has progressively gotten worse since last visit with Dr. [name] in December. Sherri requested to speak with Dr. [name] assistant. ?She is concerned he may need in home assistance or hospice.  ?Only appts available with Dr. [name] this week: ?Wednesday @ 4:00pm, Friday @ 11:30am & 4:00pm ? ?Sherri can be reached at [phone] ?

## 2021-11-14 NOTE — ED Triage Notes (Signed)
"  History of dementia and trouble communicating, but recently becoming more confused and familiy stated his urine is foul smelling" per EMS ?

## 2021-11-17 ENCOUNTER — Telehealth: Payer: Self-pay | Admitting: Neurology

## 2021-11-17 ENCOUNTER — Ambulatory Visit: Payer: Medicare HMO | Admitting: Family Medicine

## 2021-11-17 NOTE — Telephone Encounter (Signed)
Patients wife called and stated she took her husband to the hospital tues.  She would like to get a referral for [facility] to evaluate him.  He has been coughing a lot, his legs give out on him, he will not sit always walking around, he walked down the rd and fell, having a hard time swallowing.  She does not know what she should do. ?

## 2021-11-17 NOTE — Telephone Encounter (Signed)
Pls put on Sara's schedule next week for earlier appointment, thanks ?

## 2021-11-20 ENCOUNTER — Other Ambulatory Visit: Payer: Self-pay

## 2021-11-20 ENCOUNTER — Telehealth: Payer: Self-pay | Admitting: Cardiovascular Disease

## 2021-11-20 ENCOUNTER — Encounter: Payer: Self-pay | Admitting: Family Medicine

## 2021-11-20 ENCOUNTER — Ambulatory Visit (INDEPENDENT_AMBULATORY_CARE_PROVIDER_SITE_OTHER): Payer: Medicare HMO | Admitting: Family Medicine

## 2021-11-20 VITALS — BP 102/42 | HR 89 | Temp 97.8°F | Ht 67.0 in | Wt 243.0 lb

## 2021-11-20 DIAGNOSIS — F03C0 Unspecified dementia, severe, without behavioral disturbance, psychotic disturbance, mood disturbance, and anxiety: Secondary | ICD-10-CM | POA: Diagnosis not present

## 2021-11-20 DIAGNOSIS — I480 Paroxysmal atrial fibrillation: Secondary | ICD-10-CM | POA: Diagnosis not present

## 2021-11-20 DIAGNOSIS — R058 Other specified cough: Secondary | ICD-10-CM

## 2021-11-20 DIAGNOSIS — I4821 Permanent atrial fibrillation: Secondary | ICD-10-CM | POA: Diagnosis not present

## 2021-11-20 DIAGNOSIS — F339 Major depressive disorder, recurrent, unspecified: Secondary | ICD-10-CM | POA: Diagnosis not present

## 2021-11-20 DIAGNOSIS — I5032 Chronic diastolic (congestive) heart failure: Secondary | ICD-10-CM | POA: Diagnosis not present

## 2021-11-20 NOTE — Telephone Encounter (Signed)
Called patient, LVM apologizing for the delay advising that the message came in after 5, our DOD had left for the day, however if patient was having symptomatic AFIB we recommend he go to the ED, or call after hours line to discuss further. Left call back number.  ?

## 2021-11-20 NOTE — Progress Notes (Incomplete)
This visit occurred during the SARS-CoV-2 public health emergency.  Safety protocols were in place, including screening questions prior to the visit, additional usage of staff PPE, and extensive cleaning of exam room while observing appropriate contact time as indicated for disinfecting solutions.    [name] Flesher Sr. , 12/26/1950, 71 y.o., male MRN: [id number] Patient Care Team    Relationship Specialty Notifications Start End  [name], DO PCP - General Family Medicine  11/08/17   Troy Sine, MD PCP - Cardiology Cardiology Admissions 02/17/19   [name], MD Consulting Physician Neurology  11/08/17   Clarene Essex, MD Consulting Physician Gastroenterology  11/29/17   Melissa Noon, [place] Referring Physician Optometry  06/30/20   Rod Can, MD Consulting Physician Orthopedic Surgery  06/30/20   Ashley Royalty, [place]  Physician Assistant  06/30/20   Cameron Sprang, MD Consulting Physician Neurology  12/16/20     Chief Complaint  Patient presents with   Cough    Pt c/o productive cough, SOB, fatigue and congestion x 2 mos;      Subjective: Pt presents for an OV with complaints of *** of *** duration.  Associated symptoms include ***.  Pt has tried *** to ease their symptoms.   Depression screen [facility] 2/9 12/13/2020 06/30/2020 12/09/2019 05/26/2019 11/17/2018  Decreased Interest 1 3 0 0 0  Down, Depressed, Hopeless 0 3 1 1 1   PHQ - 2 Score 1 6 1 1 1   Altered sleeping - 3 0 1 1  Tired, decreased energy - 2 2 1 1   Change in appetite - 3 1 1  0  Feeling bad or failure about yourself  - 3 0 1 1  Trouble concentrating - 1 1 1  0  Moving slowly or fidgety/restless - 2 0 1 0  Suicidal thoughts - 1 0 0 0  PHQ-9 Score - 21 5 7 4   Difficult doing work/chores - - Not difficult at all Somewhat difficult Somewhat difficult    Allergies  Allergen Reactions   Losartan Potassium-Hctz Diarrhea   Nsaids Other (See Comments)    On eliquis.    Zoloft [Sertraline Hcl] Diarrhea    Benzodiazepines Other (See Comments)    Agitation.   Social History   Social History Narrative   Lives at home with wife [name].   College-educated. Works in Public house manager.    Right handed   Past Medical History:  Diagnosis Date   Acute delirium 07/14/2020   Adenomatous colon polyp 2016   Allergy    AMS (altered mental status) 07/13/2020   Anxiety    Bell palsy    resolved   Bell's palsy 09/14/2020   CHF (congestive heart failure) (HCC)    Chicken pox    Concussion with no loss of consciousness 07/07/2018   COVID-19 04/04/2021   Depression    Diet-controlled diabetes mellitus ([place])    Eating disorder    Essential hypertension 11/29/2017   Gastro-esophageal reflux disease without esophagitis 09/14/2020   GERD (gastroesophageal reflux disease)    History of vitamin D deficiency    Hyperlipidemia    Hypertension    Hypokalemia    Memory loss    MI (myocardial infarction) ([place])    per pt    Neoplasm of uncertain behavior of skin 09/14/2020   Personal history of colonic polyps 09/14/2020   Ulcerative colitis ([place]) 2005-2006   severe    Past Surgical History:  Procedure Laterality Date   COLONOSCOPY  WITH PROPOFOL N/A 07/15/2014   Procedure: COLONOSCOPY WITH PROPOFOL;  Surgeon: [name], MD;  Location: [facility];  Service: Endoscopy;  Laterality: N/A;   COLONOSCOPY WITH PROPOFOL N/A 08/22/2015   Procedure: COLONOSCOPY WITH PROPOFOL;  Surgeon: [name], MD;  Location: [facility];  Service: Endoscopy;  Laterality: N/A;   ORIF ANKLE FRACTURE  02/27/2012   Procedure: OPEN REDUCTION INTERNAL FIXATION (ORIF) ANKLE FRACTURE;  Surgeon: Colin Rhein, MD;  Location: [place];  Service: Orthopedics;  Laterality: Left;  ORIF left bimalleolar ankle fracture   SHOULDER SURGERY     LEFT   SIGMOIDOSCOPY  10/08/2018   Externa and Internal Hmorrhoids. Tubular and Tublovillous adenoma removed from transverse colon.  Inflammatory pseudopolyps, negative for dysplasia   TONSILLECTOMY     UPPER GASTROINTESTINAL ENDOSCOPY     Family History  Problem Relation Age of Onset   Dementia Mother    Hypertension Mother    Early death Father 40       drowning    Post-traumatic stress disorder Brother    Neuropathy Brother    Allergies as of 11/20/2021       Reactions   Losartan Potassium-hctz Diarrhea   Nsaids Other (See Comments)   On eliquis.    Zoloft [sertraline Hcl] Diarrhea   Benzodiazepines Other (See Comments)   Agitation.        Medication List        Accurate as of November 20, 2021  3:50 PM. If you have any questions, ask your nurse or doctor.          STOP taking these medications    doxycycline 100 MG tablet Commonly known as: VIBRA-TABS Stopped by: Howard Pouch, DO       TAKE these medications    apixaban 5 MG Tabs tablet Commonly known as: ELIQUIS Take 1 tablet (5 mg total) by mouth 2 (two) times daily.   atorvastatin 20 MG tablet Commonly known as: LIPITOR Take 20 mg by mouth daily.   azaTHIOprine 50 MG tablet Commonly known as: IMURAN Take 200 mg by mouth every morning.   benzonatate 100 MG capsule Commonly known as: Tessalon Perles 1-2 capsules up to twice daily as needed for cough   divalproex 125 MG capsule Commonly known as: DEPAKOTE SPRINKLE Take 6 capsules (750 mg total) by mouth every 12 (twelve) hours. What changed: how much to take   docusate sodium 100 MG capsule Commonly known as: Colace Take 1 capsule (100 mg total) by mouth 2 (two) times daily.   DULoxetine 60 MG capsule Commonly known as: CYMBALTA Take 1 capsule (60 mg total) by mouth daily.   furosemide 40 MG tablet Commonly known as: LASIX Take 1 tablet (40 mg total) by mouth daily as needed.   hydrOXYzine 10 MG tablet Commonly known as: ATARAX Take 10 mg by mouth 2 (two) times daily as needed.   levocetirizine 5 MG tablet Commonly known as: XYZAL Take 5 mg by mouth every  evening.   levothyroxine 50 MCG tablet Commonly known as: Synthroid Take 1 tablet (50 mcg total) by mouth daily before breakfast.   Melatonin 10 MG Tabs Take 10 mg by mouth at bedtime.   metoprolol succinate 25 MG 24 hr tablet Commonly known as: TOPROL-XL TAKE 1 TABLET BY MOUTH DAILY. TAKE WITH OR IMMEDIATELY FOLLOWING A MEAL.   OLANZapine 5 MG tablet Commonly known as: ZYPREXA Take by mouth. What changed: Another medication with the same name was removed. Continue taking this  medication, and follow the directions you see here. Changed by: Howard Pouch, DO   potassium chloride 10 MEQ tablet Commonly known as: Klor-Con M10 Take 1 tablet (10 mEq total) by mouth daily.   tamsulosin 0.4 MG Caps capsule Commonly known as: FLOMAX Take 1 capsule (0.4 mg total) by mouth daily.   traZODone 100 MG tablet Commonly known as: DESYREL Take 100 mg by mouth at bedtime as needed. for sleep What changed: Another medication with the same name was removed. Continue taking this medication, and follow the directions you see here. Changed by: Howard Pouch, DO        All past medical history, surgical history, allergies, family history, immunizations andmedications were updated in the EMR today and reviewed under the history and medication portions of their EMR.     ROS Negative, with the exception of above mentioned in HPI   Objective:  BP (!) 102/42    Pulse 89    Temp 97.8 F (36.6 C) (Oral)    Ht 5' 7"  (1.702 m)    Wt 243 lb (110.2 kg)    SpO2 95%    BMI 38.06 kg/m  Body mass index is 38.06 kg/m. Physical Exam Gen: Afebrile. No acute distress. Nontoxic in appearance, well developed, well nourished.  HENT: AT. [place]. Bilateral TM visualized ***. MMM, no oral lesions. Bilateral nares ***. Throat without erythema or exudates. *** Eyes:Pupils Equal Round Reactive to light, Extraocular movements intact,  Conjunctiva without redness, discharge or icterus. Neck/lymp/endocrine: Supple,***  lymphadenopathy CV: RRR ***, ***edema Chest: CTAB, no wheeze or crackles. Good air movement, normal resp effort.  Abd: Soft. ***. NTND. BS ***. *** Masses palpated. No rebound or guarding. *** MSK: *** Skin: *** rashes, purpura or petechiae.  Neuro: Normal gait. PERLA. EOMi. Alert. Oriented x3   No results found. No results found. No results found for this or any previous visit (from the past 24 hour(s)).  Assessment/Plan: [name] Flesher Sr. is a 71 y.o. male present for OV for  *** Reviewed expectations re: course of current medical issues. Discussed self-management of symptoms. Outlined signs and symptoms indicating need for more acute intervention. Patient verbalized understanding and all questions were answered. Patient received an After-Visit Summary.    No orders of the defined types were placed in this encounter.  No orders of the defined types were placed in this encounter.  Referral Orders  No referral(s) requested today     Note is dictated utilizing voice recognition software. Although note has been proof read prior to signing, occasional typographical errors still can be missed. If any questions arise, please do not hesitate to call for verification.   electronically signed by:  Howard Pouch, DO  [place]

## 2021-11-20 NOTE — Telephone Encounter (Signed)
?  Pt c/o Shortness Of Breath: STAT if SOB developed within the last 24 hours or pt is noticeably SOB on the phone ? ?1. Are you currently SOB (can you hear that pt is SOB on the phone)? yes ? ?2. How long have you been experiencing SOB? Couple weeks ? ?3. Are you SOB when sitting or when up moving around? both ? ?4. Are you currently experiencing any other symptoms?  Low BP, Afib  ? ?Patient c/o Palpitations:  High priority if patient c/o lightheadedness, shortness of breath, or chest pain ? ?How long have you had palpitations/irregular HR/ Afib? Are you having the symptoms now? Week, yes PCP said he is in Afib today ? ?Are you currently experiencing lightheadedness, SOB or CP? SOB, CP ? ?Do you have a history of afib (atrial fibrillation) or irregular heart rhythm? yes ? ?Have you checked your BP or HR? (document readings if available): 102/42, HR 89 ? ?Are you experiencing any other symptoms? no  ? ? ?Attempted to contact triage, sent high priority ?

## 2021-11-21 ENCOUNTER — Telehealth: Payer: Self-pay | Admitting: Cardiovascular Disease

## 2021-11-21 NOTE — Telephone Encounter (Signed)
Discussed with Dr Gardiner Rhyme (DOD) this does not sound heart related. Go to the ER for evaluation. ? ?Tried to call back, left detailed voice mail to go to the ER for evaluation. ?

## 2021-11-21 NOTE — Telephone Encounter (Signed)
Returned call to pt wife she states that pt went to PCP yesterday and they told him he was in AFIB. She further states that pt c/o weakness, SOB, DOE and weakness in the LE (he can barely walk, stand stands only for a few minutes and has to rest). Wife states that pt has dementia and they make sure that he takes his medications correctly. He takes lasix Monday, Wednesday and Friday. So no lasix today. His creatinine 3-7 was 0.86 this is stable. Wife said that PCP said "they may want to change the Eliquis because he is in AFIB". His BP yesterday at the PCP was 102/42 HR 89 and now with wrist cuff 134/92 HR 103. There is no note from yesterday or EKG (they did not do EKG). Please advise. ?

## 2021-11-21 NOTE — Progress Notes (Signed)
? ? ? ?This visit occurred during the SARS-CoV-2 public health emergency.  Safety protocols were in place, including screening questions prior to the visit, additional usage of staff PPE, and extensive cleaning of exam room while observing appropriate contact time as indicated for disinfecting solutions.  ? ? ?[name] Flesher Sr. , 05-02-1951, 71 y.o., male ?MRN: [id number] ?Patient Care Team  ?  Relationship Specialty Notifications Start End  ?[name], DO PCP - General Family Medicine  11/08/17   ?Troy Sine, MD PCP - Cardiology Cardiology Admissions 02/17/19   ?[name], MD Consulting Physician Neurology  11/08/17   ?Clarene Essex, MD Consulting Physician Gastroenterology  11/29/17   ?Melissa Noon, [place] Referring Physician Optometry  06/30/20   ?Rod Can, MD Consulting Physician Orthopedic Surgery  06/30/20   ?Ashley Royalty, [place]  Physician Assistant  06/30/20   ?Cameron Sprang, MD Consulting Physician Neurology  12/16/20   ? ? ?Chief Complaint  ?Patient presents with  ? Cough  ?  Pt c/o productive cough, SOB, fatigue and congestion x 2 mos;   ? ?  ?Subjective: Pt presents for an OV with wife and hired caregiver today to discuss patient's productive cough, congestion, fatigue and shortness of breath.  He was seen in the emergency room 11-14-2021 with normal work-up including chest x-ray, CMP, CBC, negative influenza A/B, negative COVID, positive ketones in his urine otherwise normal.  CT head was without acute changes.  EKG unchanged from priors with atrial fib.  Patient's wife states that she was told that his symptoms are possibly related to his dementia process. ? ?Depression screen [facility] 2/9 12/13/2020 06/30/2020 12/09/2019 05/26/2019 11/17/2018  ?Decreased Interest 1 3 0 0 0  ?Down, Depressed, Hopeless 0 3 1 1 1   ?PHQ - 2 Score 1 6 1 1 1   ?Altered sleeping - 3 0 1 1  ?Tired, decreased energy - 2 2 1 1   ?Change in appetite - 3 1 1  0  ?Feeling bad or failure about yourself  - 3 0 1 1  ?Trouble  concentrating - 1 1 1  0  ?Moving slowly or fidgety/restless - 2 0 1 0  ?Suicidal thoughts - 1 0 0 0  ?PHQ-9 Score - 21 5 7 4   ?Difficult doing work/chores - - Not difficult at all Somewhat difficult Somewhat difficult  ? ? ?Allergies  ?Allergen Reactions  ? Losartan Potassium-Hctz Diarrhea  ? Nsaids Other (See Comments)  ?  On eliquis.   ? Zoloft [Sertraline Hcl] Diarrhea  ? Benzodiazepines Other (See Comments)  ?  Agitation.  ? ?Social History  ? ?Social History Narrative  ? Lives at home with wife [name].  ? College-educated. Works in Public house manager.   ? Right handed  ? ?Past Medical History:  ?Diagnosis Date  ? Acute delirium 07/14/2020  ? Adenomatous colon polyp 2016  ? Allergy   ? AMS (altered mental status) 07/13/2020  ? Anxiety   ? Bell palsy   ? resolved  ? Bell's palsy 09/14/2020  ? CHF (congestive heart failure) ([place])   ? Chicken pox   ? Concussion with no loss of consciousness 07/07/2018  ? COVID-19 04/04/2021  ? Depression   ? Diet-controlled diabetes mellitus ([place])   ? Eating disorder   ? Essential hypertension 11/29/2017  ? Gastro-esophageal reflux disease without esophagitis 09/14/2020  ? GERD (gastroesophageal reflux disease)   ? History of vitamin D deficiency   ? Hyperlipidemia   ? Hypertension   ? Hypokalemia   ?  Memory loss   ? MI (myocardial infarction) ([place])   ? per pt   ? Neoplasm of uncertain behavior of skin 09/14/2020  ? Personal history of colonic polyps 09/14/2020  ? Ulcerative colitis ([place]) 2005-2006  ? severe   ? ?Past Surgical History:  ?Procedure Laterality Date  ? COLONOSCOPY WITH PROPOFOL N/A 07/15/2014  ? Procedure: COLONOSCOPY WITH PROPOFOL;  Surgeon: [name], MD;  [facility];  Service: Endoscopy;  Laterality: N/A;  ? COLONOSCOPY WITH PROPOFOL N/A 08/22/2015  ? Procedure: COLONOSCOPY WITH PROPOFOL;  Surgeon: [name], MD;  [facility];  Service: Endoscopy;  Laterality: N/A;  ? ORIF ANKLE FRACTURE  02/27/2012  ? Procedure: OPEN REDUCTION  INTERNAL FIXATION (ORIF) ANKLE FRACTURE;  Surgeon: Colin Rhein, MD;  Location: [place];  Service: Orthopedics;  Laterality: Left;  ORIF left bimalleolar ankle fracture  ? SHOULDER SURGERY    ? LEFT  ? SIGMOIDOSCOPY  10/08/2018  ? Externa and Internal Hmorrhoids. Tubular and Tublovillous adenoma removed from transverse colon. Inflammatory pseudopolyps, negative for dysplasia  ? TONSILLECTOMY    ? UPPER GASTROINTESTINAL ENDOSCOPY    ? ?Family History  ?Problem Relation Age of Onset  ? Dementia Mother   ? Hypertension Mother   ? Early death Father 54  ?     drowning   ? Post-traumatic stress disorder Brother   ? Neuropathy Brother   ? ?Allergies as of 11/20/2021   ? ?   Reactions  ? Losartan Potassium-hctz Diarrhea  ? Nsaids Other (See Comments)  ? On eliquis.   ? Zoloft [sertraline Hcl] Diarrhea  ? Benzodiazepines Other (See Comments)  ? Agitation.  ? ?  ? ?  ?Medication List  ?  ? ?  ? Accurate as of November 20, 2021 11:59 PM. If you have any questions, ask your nurse or doctor.  ?  ?  ? ?  ? ?STOP taking these medications   ? ?benzonatate 100 MG capsule ?Commonly known as: Best boy ?Stopped by: Howard Pouch, DO ?  ?doxycycline 100 MG tablet ?Commonly known as: VIBRA-TABS ?Stopped by: Howard Pouch, DO ?  ? ?  ? ?TAKE these medications   ? ?apixaban 5 MG Tabs tablet ?Commonly known as: ELIQUIS ?Take 1 tablet (5 mg total) by mouth 2 (two) times daily. ?  ?atorvastatin 20 MG tablet ?Commonly known as: LIPITOR ?Take 20 mg by mouth daily. ?  ?azaTHIOprine 50 MG tablet ?Commonly known as: IMURAN ?Take 200 mg by mouth every morning. ?  ?divalproex 125 MG capsule ?Commonly known as: DEPAKOTE SPRINKLE ?Take 6 capsules (750 mg total) by mouth every 12 (twelve) hours. ?What changed: how much to take ?  ?docusate sodium 100 MG capsule ?Commonly known as: Colace ?Take 1 capsule (100 mg total) by mouth 2 (two) times daily. ?  ?DULoxetine 60 MG capsule ?Commonly known as: CYMBALTA ?Take 1 capsule (60 mg  total) by mouth daily. ?  ?furosemide 40 MG tablet ?Commonly known as: LASIX ?Take 1 tablet (40 mg total) by mouth daily as needed. ?  ?hydrOXYzine 10 MG tablet ?Commonly known as: ATARAX ?Take 10 mg by mouth 2 (two) times daily as needed. ?  ?levocetirizine 5 MG tablet ?Commonly known as: XYZAL ?Take 5 mg by mouth every evening. ?  ?levothyroxine 50 MCG tablet ?Commonly known as: Synthroid ?Take 1 tablet (50 mcg total) by mouth daily before breakfast. ?  ?Melatonin 10 MG Tabs ?Take 10 mg by mouth at bedtime. ?  ?metoprolol succinate  25 MG 24 hr tablet ?Commonly known as: TOPROL-XL ?TAKE 1 TABLET BY MOUTH DAILY. TAKE WITH OR IMMEDIATELY FOLLOWING A MEAL. ?  ?OLANZapine 5 MG tablet ?Commonly known as: ZYPREXA ?Take by mouth. ?What changed: Another medication with the same name was removed. Continue taking this medication, and follow the directions you see here. ?Changed by: Howard Pouch, DO ?  ?potassium chloride 10 MEQ tablet ?Commonly known as: Klor-Con M10 ?Take 1 tablet (10 mEq total) by mouth daily. ?  ?tamsulosin 0.4 MG Caps capsule ?Commonly known as: FLOMAX ?Take 1 capsule (0.4 mg total) by mouth daily. ?  ?traZODone 100 MG tablet ?Commonly known as: DESYREL ?Take 100 mg by mouth at bedtime as needed. for sleep ?What changed: Another medication with the same name was removed. Continue taking this medication, and follow the directions you see here. ?Changed by: Howard Pouch, DO ?  ? ?  ? ? ?All past medical history, surgical history, allergies, family history, immunizations andmedications were updated in the EMR today and reviewed under the history and medication portions of their EMR.    ? ?ROS ?Negative, with the exception of above mentioned in HPI ? ? ?Objective:  ?BP (!) 102/42   Pulse 89   Temp 97.8 ?F (36.6 ?C) (Oral)   Ht 5' 7"  (1.702 m)   Wt 243 lb (110.2 kg)   SpO2 95%   BMI 38.06 kg/m?  ?Body mass index is 38.06 kg/m?Marland Kitchen ?Physical Exam ?Vitals and nursing note reviewed.  ?Constitutional:   ?    General: He is not in acute distress. ?   Appearance: Normal appearance. He is obese. He is not ill-appearing, toxic-appearing or diaphoretic.  ?HENT:  ?   Head: Normocephalic and atraumatic.  ?   Right Ear: Tympanic membrane

## 2021-11-21 NOTE — Telephone Encounter (Signed)
Pt c/o Shortness Of Breath: STAT if SOB developed within the last 24 hours or pt is noticeably SOB on the phone ? ?1. Are you currently SOB (can you hear that pt is SOB on the phone)? Yes  ? ?2. How long have you been experiencing SOB? Yesterday was in afib ? ?3. Are you SOB when sitting or when up moving around? Both ? ?4. Are you currently experiencing any other symptoms? Weakness in legs  ?

## 2021-11-21 NOTE — Telephone Encounter (Signed)
See other phone message dated 11-21-21 ?

## 2021-11-21 NOTE — Telephone Encounter (Signed)
Late entry>>>just noticed that pt was told to go to the ER yesterday as well-wife did not take him to the ER as directed ?

## 2021-11-22 ENCOUNTER — Other Ambulatory Visit: Payer: Self-pay

## 2021-11-22 ENCOUNTER — Ambulatory Visit: Payer: Medicare HMO

## 2021-11-22 NOTE — Telephone Encounter (Signed)
Pt did not go to the ER as directed 3-13 or 3-14. ?Called daughters number and left message-calling to see how pt is doing and re-iterate that pt should be taken to the ER if status has not changed. ?

## 2021-11-24 ENCOUNTER — Ambulatory Visit: Payer: Medicare HMO | Admitting: Physician Assistant

## 2021-11-24 ENCOUNTER — Other Ambulatory Visit: Payer: Self-pay

## 2021-11-24 ENCOUNTER — Encounter: Payer: Self-pay | Admitting: Physician Assistant

## 2021-11-24 VITALS — BP 110/72 | HR 88 | Ht 67.0 in | Wt 245.2 lb

## 2021-11-24 DIAGNOSIS — F02818 Dementia in other diseases classified elsewhere, unspecified severity, with other behavioral disturbance: Secondary | ICD-10-CM | POA: Diagnosis not present

## 2021-11-24 DIAGNOSIS — G301 Alzheimer's disease with late onset: Secondary | ICD-10-CM

## 2021-11-24 DIAGNOSIS — G20 Parkinson's disease: Secondary | ICD-10-CM | POA: Diagnosis not present

## 2021-11-24 NOTE — Progress Notes (Signed)
? ?NEUROLOGY FOLLOW UP OFFICE NOTE ? ?[name] TORELLI Sr. ?[id number] ?03-24-1951 ?  ? ?HISTORY OF PRESENT ILLNESS: ? ?I had the pleasure of seeing [name] [place] Sr. in follow-up in the neurology clinic on 11/20/2020/ He  was last seen at our office on 06/27/2021 for evaluation of memory loss. 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 Sinemet was briefly started then stopped.In today's visit, his wife reports that his abnormal movements are improved, and she does not wish to proceed with any further studies at this time. Patient is showing progression of his memory loss and instead, she is interested in Palliative Care evaluation. "Everything is worse about his memory. My mother died two months ago, went to the funeral and cannot recall any of this, he does not know she died".  Speech today is again tangential. "He is able to speak full sentences but a lot of times she does not understand what he is saying". His speech is "scrambled".  Sometimes he does not recognize his wife. Appetite is decreased, no apparent swallowing issues or increased salivation.  He has an aide that comes from 9 am to 9 pm to help him with ADLS and for safety. He continues to pick things around the house and place them in his pockets, "but not as much as before". He still has wandering behavior "  a man brought [place] home as he wandered off going to neighbors house ". However he appears weaker "HIs legs give out on him, he sometimes shuffles with his walker". "His steps are shorter". She is declining any PT at this time. He was previously on a higher dose of Zyprexa prescribed by his psychiatrist, then discontinued, now at 5 mg daily with good therapeutic results. He is also on Depakote. He is on Depakote sprinkles, 500 mg q12  h,trazodone 100 qhs for sleep, Cymbalta 60 mg qd, tolerating well.    ? ? ?History on Initial Assessment 12/16/2020: This is a pleasant 71 year old right-handed man with a history of hypertension, hyperlipidemia, DM, untreated sleep apnea (could not tolerate different CPAP masks), depression, anxiety, presenting to establish care for dementia. He is accompanied by his wife who helps supplement the history today. Records were reviewed, he was evaluated by neurologist Dr. Jaynee Eagles in 2019. Memory changes started in 2018, he was forgetting appointments and having difficulties managing medications. MMSE 24/30, previously 28/30, felt due to untreated significant depression. He was also found to have sleep apnea. Brain MRI in 2019 showed mild to moderate diffuse atrophy, more pronounced in the mesial temporal lobes. He had a PET scan in 2019 with mild (very subtle) decreased relative cortical metabolism within the biparietal and temporal lobes compared to the frontal lobes. Neuropsychological evaluation had been recommended however he was unable to proceed. He presents today with continued cognitive decline. Speech today is tangential. His wife noticed speech changes become more apparent in 2020. He started having issues with atrial fibrillation and was in the hospital, and she was told he had dementia. He was brought to the hospital in November 2021 for agitation, confusion and was under IVC in December 2021. He was hallucinating, combative, not recognizing his wife. He was in the hospital until December 31st, she retired the day prior and brought him home. He lives with his wife  and 59 year old mother-in-law who also has dementia. They have a caregiver during the daytime Monday to Thursday. She reports he has been worse ever since. He stopped driving in October 0347 when he went on the highway in his underwear. Twice he roamed out at night in the winter in his diaper, shirt, socks, and walked 3 houses down saying he had an  appointment to service their fireplace. House now has locks.He would put 2 shirts on, he has a diaper, pajama pants, and his pants on today. He will take a bath after much encouragement. Most of the time he is sitting on his chair. He does not recognize their home of 18 years, he would urinate where he is because he cannot get to the bathroom. He has lost his wallet, hearing aids, he has taken her dentures. He packs stuff and pockets so many things in small places that do not make sense. She notes he is constantly moving his hands. He has been seeing psychiatry and is on Depakote 736m BID, Cymbalta 636mdaily, olanzapine 28m56mn AM, 128m77m PM. He was started on Memantine but wife notes a reverse reaction on him, symptoms got real bad and dose reduced to 10mg928mly. ?  ?Speech fluent but nonsensical and tangential at times. Other times he makes paraphasic errors with word-finding difficulties. He does not answer questions consistently. He states he married his first wife in 1955 44she died in 1945.49states they have 3 children (they have 2 children). He has been having pain in his back and leg the past 3 days. He was in the ER last Monday after he lost his balance from standing up from the toilet, hitting his head on the wall. No loss of consciousness. His wife notes sleep is okay, she gives 3 over the counter sleep medications, including Benadryl and melatonin 10mg 38m He has visual hallucinations, asking about 2 men or the boys in their home. Half the time there are 3 of his wife, he does not want her to touch him because his wife who lives upstairs does not like when he touches her.  ?  ? ?PAST MEDICAL HISTORY: ?Past Medical History:  ?Diagnosis Date  ? Acute delirium 07/14/2020  ? Adenomatous colon polyp 2016  ? Allergy   ? AMS (altered mental status) 07/13/2020  ? Anxiety   ? Bell palsy   ? resolved  ? Bell's palsy 09/14/2020  ? CHF (congestive heart failure) (HCC)  [place]icken pox   ? Concussion with no loss  of consciousness 07/07/2018  ? COVID-19 04/04/2021  ? Depression   ? Diet-controlled diabetes mellitus (HCC)  [place] disorder   ? Essential hypertension 11/29/2017  ? Gastro-esophageal reflux disease without esophagitis 09/14/2020  ? GERD (gastroesophageal reflux disease)   ? History of vitamin D deficiency   ? Hyperlipidemia   ? Hypertension   ? Hypokalemia   ? Memory loss   ? MI (myocardial infarction) (HCC)  [place] pt   ? Neoplasm of uncertain behavior of skin 09/14/2020  ? Personal history of colonic polyps 09/14/2020  ? Ulcerative colitis (HCC) 2[place]-2006  ? severe   ? ? ?MEDICATIONS: ?Current Outpatient Medications on File Prior to Visit  ?Medication Sig Dispense Refill  ? apixaban (ELIQUIS) 5 MG TABS tablet Take 1 tablet (5 mg total) by mouth 2 (two) times daily. 60 tablet 11  ? atorvastatin (LIPITOR) 20 MG tablet Take 20 mg by mouth daily.    ?  azaTHIOprine (IMURAN) 50 MG tablet Take 200 mg by mouth every morning.    ? benzonatate (TESSALON) 100 MG capsule Take by mouth 3 (three) times daily as needed for cough.    ? divalproex (DEPAKOTE SPRINKLE) 125 MG capsule Take 6 capsules (750 mg total) by mouth every 12 (twelve) hours. (Patient taking differently: Take 500 mg by mouth every 12 (twelve) hours.) 360 capsule 3  ? docusate sodium (COLACE) 100 MG capsule Take 1 capsule (100 mg total) by mouth 2 (two) times daily. 60 capsule 11  ? DULoxetine (CYMBALTA) 60 MG capsule Take 1 capsule (60 mg total) by mouth daily. 90 capsule 1  ? furosemide (LASIX) 40 MG tablet Take 1 tablet (40 mg total) by mouth daily as needed. 90 tablet 3  ? hydrOXYzine (ATARAX) 10 MG tablet Take 10 mg by mouth 2 (two) times daily as needed.    ? levocetirizine (XYZAL) 5 MG tablet Take 5 mg by mouth every evening.    ? levothyroxine (SYNTHROID) 50 MCG tablet Take 1 tablet (50 mcg total) by mouth daily before breakfast. 90 tablet 3  ? metoprolol succinate (TOPROL-XL) 25 MG 24 hr tablet TAKE 1 TABLET BY MOUTH DAILY. TAKE WITH OR IMMEDIATELY  FOLLOWING A MEAL. 90 tablet 3  ? OLANZapine (ZYPREXA) 5 MG tablet Take by mouth.    ? potassium chloride (KLOR-CON M10) 10 MEQ tablet Take 1 tablet (10 mEq total) by mouth daily. 90 tablet 3  ? tamsulosin (FL

## 2021-11-24 NOTE — Patient Instructions (Addendum)
Continue current Depakote dose  ? ?2. Contact your psychiatrist for adjustment of medications for behaviors associated with dementia ? ?Referral to Palliative Care, [facility]  ? ?3. Follow-up in 3 months, video  ? ? ?FALL PRECAUTIONS: Be cautious when walking. Scan the area for obstacles that may increase the risk of trips and falls. When getting up in the mornings, sit up at the edge of the bed for a few minutes before getting out of bed. Consider elevating the bed at the head end to avoid drop of blood pressure when getting up. Walk always in a well-lit room (use night lights in the walls). Avoid area rugs or power cords from appliances in the middle of the walkways. Use a walker or a cane if necessary and consider physical therapy for balance exercise. Get your eyesight checked regularly. ? ?HOME SAFETY: Consider the safety of the kitchen when operating appliances like stoves, microwave oven, and blender. Consider having supervision and share cooking responsibilities until no longer able to participate in those. Accidents with firearms and other hazards in the house should be identified and addressed as well. ? ?ABILITY TO BE LEFT ALONE: If patient is unable to contact 911 operator, consider using LifeLine, or when the need is there, arrange for someone to stay with patients. Smoking is a fire hazard, consider supervision or cessation. Risk of wandering should be assessed by caregiver and if detected at any point, supervision and safe proof recommendations should be instituted. ? ?RECOMMENDATIONS FOR ALL PATIENTS WITH MEMORY PROBLEMS: ?1. Continue to exercise (Recommend 30 minutes of walking everyday, or 3 hours every week) ?2. Increase social interactions - continue going to [place] and enjoy social gatherings with friends and family ?3. Eat healthy, avoid fried foods and eat more fruits and vegetables ?4. Maintain adequate blood pressure, blood sugar, and blood cholesterol level. Reducing the risk of  stroke and cardiovascular disease also helps promoting better memory. ?5. Avoid stressful situations. Live a simple life and avoid aggravations. Organize your time and prepare for the next day in anticipation. ?6. Sleep well, avoid any interruptions of sleep and avoid any distractions in the bedroom that may interfere with adequate sleep quality ?7. Avoid sugar, avoid sweets as there is a strong link between excessive sugar intake, diabetes, and cognitive impairment ?The Mediterranean diet has been shown to help patients reduce the risk of progressive memory disorders and reduces cardiovascular risk. This includes eating fish, eat fruits and green leafy vegetables, nuts like almonds and hazelnuts, walnuts, and also use olive oil. Avoid fast foods and fried foods as much as possible. Avoid sweets and sugar as sugar use has been linked to worsening of memory function. ? ?There is always a concern of gradual progression of memory problems. If this is the case, then we may need to adjust level of care according to patient needs. Support, both to the patient and caregiver, should then be put into place. ? ?

## 2021-11-27 ENCOUNTER — Telehealth: Payer: Self-pay | Admitting: Physician Assistant

## 2021-11-27 ENCOUNTER — Telehealth: Payer: Self-pay

## 2021-11-27 NOTE — Telephone Encounter (Signed)
[name] from [place] called and was checking up on [facility] referral for hospice care. ?

## 2021-11-27 NOTE — Telephone Encounter (Signed)
Spoke with patient's spouse [name] and scheduled a in person Palliative Consult for 11/29/21 @ 3 PM.  ? ?Consent obtained; updated Outlook/Netsmart/Team List and Epic.  ? ? ?

## 2021-11-27 NOTE — Telephone Encounter (Signed)
Looking into this at present at 2:40am ?

## 2021-11-28 NOTE — Telephone Encounter (Signed)
Appt scheduled 3-23 ?

## 2021-11-28 NOTE — Progress Notes (Signed)
?Cardiology Office Note:   ? ?Date:  11/29/2021  ? ?ID:  [name] Flesher Sr., DOB 10/11/1950, MRN [id number] ? ?PCP:  Howard Pouch A, DO ?[place] Cardiologist: Shelva Majestic, MD  ? ?Reason for visit: SOB, hypotension, a-fib ? ?History of Present Illness:   ? ?[name] Flesher Sr. is a 71 y.o. male with a hx of anxiety and depression, GERD, obesity, untreated obstructive sleep apnea, diabetes mellitus, hypertension, hyperlipidemia, hypothyroidism, benzodiazepine dependence as well as memory loss, atrial fibrillation.  ? ?Dr. Claiborne Billings last saw in November 2022.  His dementia gotten significantly worse.   ? ?He recently went to the ED for confusion.  Work-up negative.  No significant heart failure or pneumonia on chest x-ray.  UA negative.  Labs unremarkable. ? ?Today, he comes in with his wife.  His wife speaks for him as I am not able to understand the patient.  He has severe dementia.  She states the patient's had progressive shortness of breath since December.  He gets short of breath walking room to room.  He also has leg weakness.  She states he sleeps a lot.  She thinks he has been worse since her mother passed away last month.  She mentions his depression worsened with prior deaths in the family.   ? ?She notes cough with white sputum production.  His weights been stable around 240 pounds.  Therefore she has not given him much Lasix recently.  She denies complaints from him regarding any chest pain. ? ?She states Dr. [name] recommended they see cardiology to see if his atrial fibrillation could be contributing to any of his symptoms.  Looking back it looks like he has been in atrial fibrillation on his EKGs since at least 2020. ? ? ?  ?Past Medical History:  ?Diagnosis Date  ? Acute delirium 07/14/2020  ? Adenomatous colon polyp 2016  ? Allergy   ? AMS (altered mental status) 07/13/2020  ? Anxiety   ? Bell palsy   ? resolved  ? Bell's palsy 09/14/2020  ? CHF (congestive heart failure) ()   ? Chicken pox   ?  Concussion with no loss of consciousness 07/07/2018  ? COVID-19 04/04/2021  ? Depression   ? Diet-controlled diabetes mellitus ([place])   ? Eating disorder   ? Essential hypertension 11/29/2017  ? Gastro-esophageal reflux disease without esophagitis 09/14/2020  ? GERD (gastroesophageal reflux disease)   ? History of vitamin D deficiency   ? Hyperlipidemia   ? Hypertension   ? Hypokalemia   ? Memory loss   ? MI (myocardial infarction) ([place])   ? per pt   ? Neoplasm of uncertain behavior of skin 09/14/2020  ? Personal history of colonic polyps 09/14/2020  ? Ulcerative colitis ([place]) 2005-2006  ? severe   ? ? ?Past Surgical History:  ?Procedure Laterality Date  ? COLONOSCOPY WITH PROPOFOL N/A 07/15/2014  ? Procedure: COLONOSCOPY WITH PROPOFOL;  Surgeon: [name], MD;  Location: [facility];  Service: Endoscopy;  Laterality: N/A;  ? COLONOSCOPY WITH PROPOFOL N/A 08/22/2015  ? Procedure: COLONOSCOPY WITH PROPOFOL;  Surgeon: [name], MD;  Location: [facility];  Service: Endoscopy;  Laterality: N/A;  ? ORIF ANKLE FRACTURE  02/27/2012  ? Procedure: OPEN REDUCTION INTERNAL FIXATION (ORIF) ANKLE FRACTURE;  Surgeon: Colin Rhein, MD;  Location: [place];  Service: Orthopedics;  Laterality: Left;  ORIF left bimalleolar ankle fracture  ? SHOULDER SURGERY    ? LEFT  ? SIGMOIDOSCOPY  10/08/2018  ?  Externa and Internal Hmorrhoids. Tubular and Tublovillous adenoma removed from transverse colon. Inflammatory pseudopolyps, negative for dysplasia  ? TONSILLECTOMY    ? UPPER GASTROINTESTINAL ENDOSCOPY    ? ? ?Current Medications: ?Current Meds  ?Medication Sig  ? apixaban (ELIQUIS) 5 MG TABS tablet Take 1 tablet (5 mg total) by mouth 2 (two) times daily.  ? atorvastatin (LIPITOR) 20 MG tablet Take 20 mg by mouth daily.  ? azaTHIOprine (IMURAN) 50 MG tablet Take 200 mg by mouth every morning.  ? benzonatate (TESSALON) 100 MG capsule Take by mouth 3 (three) times daily as needed for cough.  ? divalproex (DEPAKOTE  SPRINKLE) 125 MG capsule Take 6 capsules (750 mg total) by mouth every 12 (twelve) hours. (Patient taking differently: Take 500 mg by mouth every 12 (twelve) hours.)  ? docusate sodium (COLACE) 100 MG capsule Take 1 capsule (100 mg total) by mouth 2 (two) times daily.  ? DULoxetine (CYMBALTA) 60 MG capsule Take 1 capsule (60 mg total) by mouth daily.  ? furosemide (LASIX) 40 MG tablet Take 1 tablet (40 mg total) by mouth daily as needed.  ? hydrOXYzine (ATARAX) 10 MG tablet Take 10 mg by mouth 2 (two) times daily as needed.  ? levocetirizine (XYZAL) 5 MG tablet Take 5 mg by mouth every evening.  ? metoprolol succinate (TOPROL-XL) 25 MG 24 hr tablet TAKE 1 TABLET BY MOUTH DAILY. TAKE WITH OR IMMEDIATELY FOLLOWING A MEAL.  ? OLANZapine (ZYPREXA) 5 MG tablet Take by mouth.  ? potassium chloride (KLOR-CON M10) 10 MEQ tablet Take 1 tablet (10 mEq total) by mouth daily.  ? tamsulosin (FLOMAX) 0.4 MG CAPS capsule Take 1 capsule (0.4 mg total) by mouth daily.  ? traZODone (DESYREL) 100 MG tablet Take 100 mg by mouth at bedtime as needed. for sleep  ?  ? ?Allergies:   Losartan potassium-hctz, Nsaids, Zoloft [sertraline hcl], and Benzodiazepines  ? ?Social History  ? ?Socioeconomic History  ? Marital status: Married  ?  Spouse name: [name]  ? Number of children: 2  ? Years of education: 63  ? Highest education level: Some college, no degree  ?Occupational History  ? Occupation: Retired  ?Tobacco Use  ? Smoking status: Former  ?  Packs/day: 1.00  ?  Years: 19.00  ?  Pack years: 19.00  ?  Types: Cigarettes  ?  Quit date: 02/25/1982  ?  Years since quitting: 39.7  ? Smokeless tobacco: Former  ?  Quit date: 39  ?Vaping Use  ? Vaping Use: Never used  ?Substance and Sexual Activity  ? Alcohol use: Not Currently  ?  Comment: occ  ? Drug use: No  ? Sexual activity: Yes  ?  Partners: Female  ?Other Topics Concern  ? Not on file  ?Social History Narrative  ? Lives at home with wife [name].  ? College-educated. Works in Engineer, civil (consulting).   ? Right handed  ? ?Social Determinants of Health  ? ?Financial Resource Strain: Low Risk   ? Difficulty of Paying Living Expenses: Not hard at all  ?Food Insecurity: No Food Insecurity  ? Worried About Charity fundraiser in the Last Year: Never true  ? Ran Out of Food in the Last Year: Never true  ?Transportation Needs: No Transportation Needs  ? Lack of Transportation (Medical): No  ? Lack of Transportation (Non-Medical): No  ?Physical Activity: Sufficiently Active  ? Days of Exercise per Week: 7 days  ? Minutes of Exercise per Session: 60 min  ?  Stress: No Stress Concern Present  ? Feeling of Stress : Not at all  ?Social Connections: Moderately Integrated  ? Frequency of Communication with Friends and Family: More than three times a week  ? Frequency of Social Gatherings with Friends and Family: More than three times a week  ? Attends Religious Services: More than 4 times per year  ? Active Member of Clubs or Organizations: No  ? Attends Archivist Meetings: Never  ? Marital Status: Married  ?  ? ?Family History: ?The patient's family history includes Dementia in his mother; Early death (age of onset: 6) in his father; Hypertension in his mother; Neuropathy in his brother; Post-traumatic stress disorder in his brother. ? ?ROS:   ?Please see the history of present illness.    ? ?EKGs/Labs/Other Studies Reviewed:   ? ?EKG:  The ekg ordered today demonstrates atrial fibrillation, nonspecific intraventricular conduction delay, heart rate 100, QRS duration 122 ms. ? ?Recent Labs: ?01/10/2021: B Natriuretic Peptide 74.1 ?01/11/2021: TSH 2.482 ?03/22/2021: Magnesium 2.0 ?10/17/2021: Pro B Natriuretic peptide (BNP) 37.0 ?11/14/2021: ALT 17; BUN 10; Creatinine, Ser 0.86; Hemoglobin 14.6; Platelets 152; Potassium 4.0; Sodium 136  ? ?Recent Lipid Panel ?Lab Results  ?Component Value Date/Time  ? CHOL 188 02/18/2019 05:53 AM  ? TRIG 67 03/14/2021 11:11 AM  ? HDL 44 02/18/2019 05:53 AM  ? LDLCALC 111  (H) 02/18/2019 05:53 AM  ? ? ?Physical Exam:   ? ?VS:  BP 102/80 (BP Location: Left Arm, Patient Position: Sitting, Cuff Size: Normal)   Pulse 100   Ht 5' 7"  (1.702 m)   Wt 242 lb (109.8 kg)   SpO2 97%   BMI

## 2021-11-28 NOTE — Telephone Encounter (Signed)
[facility] to take referral accepted the referral this am 11/28/2021 at 7:29am ?

## 2021-11-29 ENCOUNTER — Encounter: Payer: Self-pay | Admitting: Family Medicine

## 2021-11-29 ENCOUNTER — Other Ambulatory Visit: Payer: Self-pay | Admitting: Family Medicine

## 2021-11-29 ENCOUNTER — Ambulatory Visit (INDEPENDENT_AMBULATORY_CARE_PROVIDER_SITE_OTHER): Payer: Medicare HMO

## 2021-11-29 ENCOUNTER — Encounter: Payer: Self-pay | Admitting: Physician Assistant

## 2021-11-29 ENCOUNTER — Other Ambulatory Visit: Payer: Self-pay

## 2021-11-29 ENCOUNTER — Ambulatory Visit: Payer: Medicare HMO | Admitting: Physician Assistant

## 2021-11-29 VITALS — BP 132/68 | HR 92 | Temp 97.9°F | Resp 18 | Wt 242.0 lb

## 2021-11-29 VITALS — BP 102/80 | HR 100 | Ht 67.0 in | Wt 242.0 lb

## 2021-11-29 DIAGNOSIS — I700 Atherosclerosis of aorta: Secondary | ICD-10-CM

## 2021-11-29 DIAGNOSIS — R5381 Other malaise: Secondary | ICD-10-CM

## 2021-11-29 DIAGNOSIS — Z7901 Long term (current) use of anticoagulants: Secondary | ICD-10-CM | POA: Diagnosis not present

## 2021-11-29 DIAGNOSIS — F03C3 Unspecified dementia, severe, with mood disturbance: Secondary | ICD-10-CM

## 2021-11-29 DIAGNOSIS — Z66 Do not resuscitate: Secondary | ICD-10-CM

## 2021-11-29 DIAGNOSIS — I480 Paroxysmal atrial fibrillation: Secondary | ICD-10-CM

## 2021-11-29 DIAGNOSIS — I5032 Chronic diastolic (congestive) heart failure: Secondary | ICD-10-CM

## 2021-11-29 DIAGNOSIS — R600 Localized edema: Secondary | ICD-10-CM

## 2021-11-29 DIAGNOSIS — Z515 Encounter for palliative care: Secondary | ICD-10-CM

## 2021-11-29 DIAGNOSIS — R0602 Shortness of breath: Secondary | ICD-10-CM

## 2021-11-29 NOTE — Progress Notes (Signed)
? ? ?Manufacturing engineer ?[facility] Consult Note ?Telephone: [phone]  ?Fax: [phone]  ? ?Date of encounter: 11/29/21 ?11:33 PM ?PATIENT NAME: [name] Flesher Sr. ?[place] ?[place] [address]   ?[phone] (home) [phone] (work) ?DOB: 07/06/1951 ?MRN: [id number] ?PRIMARY CARE PROVIDER:    ?Rossie Muskrat,  ?[address] [place] [address] ?[phone] ? ?REFERRING PROVIDER:   ?[name], DO ?[address] [place],  [place] [address] ?[phone] ? ?RESPONSIBLE PARTY:    ?Contact Information   ? ? Name Relation Home Work Mobile  ? Devargas,Sherry Spouse [phone]  [phone]  ? Rabadan,Brad Son   [phone]  ? ?  ? ? ? ?I met face to face with patient and wife [name] in their home. Palliative Care was asked to follow this patient by consultation request of  [name], [name], DO to address advance care planning and complex medical decision making. This is the initial visit.  ? ? ?      ASSESSMENT, SYMPTOM MANAGEMENT AND PLAN / RECOMMENDATIONS:  ? Shortness of breath ?Appears euvolemic, most recent BNP 37 on 10/17/21. ?CXR 11/14/21:  Cardiomegaly with moderate pulmonary interstitial thickening, favored to be related to AP portable technique and history of smoking. Mild pulmonary venous congestion cannot be excluded. No overt congestive failure and no evidence of pneumonia. ?Cardiologist eval and in absence of CP and no ischemic change on EKG, hx of compliance with anticoagulation and negative CTA pulmonary from 01/10/21 has ordered a 3-4 day ZioPatch for monitoring. ? ? ?Severe Dementia with mood disturbance ?Needs 24/7 care and supervision ?Caregiver support/respite needed with desire to maintain at home. ? ?Palliative Care Encounter ?Wife wants to maintain at home. ?Will evaluate for Hospice eligibility ?Has DNR and will address further goals of care at next follow up. ? ? PAF ?Rate controlled today on Metoprolol with borderline low BP in MD office. ?Anticoagulated  on Eliquis 5 mg BID with increasing fall risk and poor safety awareness. ?Cardiology questioning if pt has good rate control consistently or if this is what is contributing to leg weakness and DOE. ? ?Chronic diastolic heart failure and lower extremity edema ?Last echo 05/22/21 with slight reduction in EF to 50%, decreased LV function with hypokinesis of mid to apical septum, LVH, normal RV function and inability to estimate RVSP.  Mild to moderate aortic sclerosis without stenosis. ?Continue Lasix 40 mg daily, Metoprolol XL 25 mg daily. ?Daily weights ? ?5.  Physical debility and weakness ?Less endurance, unable to comprehend instruction to successfully rehab with PT, high fall risk. ? ? ?Follow up Palliative Care Visit: Palliative care will continue to follow for complex medical decision making, advance care planning, and clarification of goals. Return 2 weeks if ineligible for Hospice services at present time. ? ? ? ?This visit was coded based on medical decision making (MDM). ? ?PPS:  40% ? ?HOSPICE ELIGIBILITY/DIAGNOSIS: TBD ? ?Chief Complaint:  ?[facility] received a referral to follow up with patient for chronic disease management in patient with advanced dementia and afib with review of advance directive and goals of care. ? ?HISTORY OF PRESENT ILLNESS:  [name] BEOUGHER Sr. is a 71 y.o. year old male with dementia who has been followed by Psychiatrist who had added Olanzapine for mood stabilization but at higher doses pt began experiencing tremor and parkinsonian like symptoms.  NM brain scan was negative for changes indicative of Parkinson's disease but showed significant atrophy. Symptoms improved with reducing  his Olanzapine and psychiatry changed his mood stabilizer to Depakote. Pt's mother-in-law also had dementia and lived with pt and his spouse until she passed away in 2021/11/21 due to end stage disease and spouse states he has deteriorated significantly since that time.   Pt has 2 paid caregivers who alternate ans help in the home from 9 am- 9pm. Pt requires assistance in all ADLs except drinking fluids. Until wife installed specialized locks pt wandered, was found 3 [address] down the [address] in his tshirt and underwear in freezing weather trying to work on a fireplace. Wife states he doesn't recognize home and will at times wake up during the night looking for "[name]" and doesn't realize she is Engineer, structural. He can be easily made anxious or agitated, has been noted to take his incontinence brief off and urinate or stool wherever he finds himself. ?Although has had weight gain, spouse states eating less, albumin 3.2 earlier this month, now choking easily. ?Incontinent of bowel and bladder, sleeping more about 16 hours per day.  He speaks in word salad. ?He has become much more dyspneic and especially with exertion and at times his legs will "give out on him" and he is at risk of fall.  His activity tolerance has significantly decreased.  He has been evaluated by Cardiology who does not think this is a CHF exacerbation given his stable EKG without ischemic change, negative proBNP earlier this month and minimal change in EF from 55 to 50% as of 05/2021.  He has been on his anticoagulant which wife administers and was negative for PE as of last May.  He does have chronic afib with non-specific IVCD and Cardiology requesting ZioPatch monitoring to ensure he is staying rate controlled. ? ? ?History obtained from review of EMR, interview with wife and/or [name].  ?I reviewed available labs, medications, imaging, studies and related documents from the EMR.  Records reviewed and summarized above.  ? ?ROS ?Unable to participate and answer questions ? ?Physical Exam: ?Current and past weights: June 2022 weight 227 lbs, 11/29/21 242 lbs ?Constitutional: NAD ?General: obese, allows pants to slide down below gluteal cleft ?EYES: anicteric sclera, lids intact, no discharge  ?ENMT: mildly hard of hearing,  oral mucous membranes moist, dentition intact ?CV: S1S2, IRIR, 2+ LE edema bilaterally with cyanotic dusky changes of feet ?Pulmonary: CTAB, diminished, no increased work of breathing, no cough, room air ?Abdomen: normo-active BS + 4 quadrants, soft and non tender, moderate distension ?GU: deferred ?MSK: no sarcopenia, moves all extremities, ambulatory with slightly stooped, unsteady gait ?Skin: warm and dry, no rashes or wounds on visible skin ?Neuro:  noted generalized weakness, noted word salad, blank facies ?Psych: non-anxious affect, intermittently appears irritable A and O only to self ?Hem/lymph/immuno: no widespread bruising ? ?CURRENT PROBLEM LIST:  ?Patient Active Problem List  ? Diagnosis Date Noted  ? Tremors of nervous system 04/24/2021  ? DNR (do not resuscitate) 04/04/2021  ? Advanced care planning/counseling discussion 04/04/2021  ? Paroxysmal atrial fibrillation ([place]) 01/11/2021  ? Chronic diastolic CHF (congestive heart failure) ([place]) 01/10/2021  ? Lower extremity edema 12/09/2020  ? Benign prostatic hyperplasia 09/15/2020  ? Chronic ulcerative pancolitis ([place]) 09/14/2020  ? Hypercholesterolemia 09/14/2020  ? Hypothyroidism   ? Vitamin B 12 deficiency   ? On statin therapy 05/26/2019  ? Acquired thrombophilia ([place]) 05/26/2019  ? QT prolongation 02/19/2019  ? Aortic atherosclerosis ([place]) 02/18/2019  ? Carotid artery calcification 02/18/2019  ? Cardiomegaly 02/18/2019  ? Dementia ([place]) 02/17/2019  ?  GAD (generalized anxiety disorder) 05/14/2018  ? Insomnia 11/29/2017  ? Moderate obstructive sleep apnea 11/08/2017  ? Morbid obesity ([place]) 11/08/2017  ? Major depression, recurrent, chronic ([place]) 09/18/2017  ? ?PAST MEDICAL HISTORY:  ?Active Ambulatory Problems  ?  Diagnosis Date Noted  ? Major depression, recurrent, chronic ([place]) 09/18/2017  ? Moderate obstructive sleep apnea 11/08/2017  ? Morbid obesity ([place]) 11/08/2017  ? Insomnia 11/29/2017  ? GAD (generalized anxiety disorder) 05/14/2018  ? Dementia  ([employer]) 02/17/2019  ? Aortic atherosclerosis ([place]) 02/18/2019  ? Carotid artery calcification 02/18/2019  ? Cardiomegaly 02/18/2019  ? QT prolongation 02/19/2019  ? On statin therapy 05/26/2019  ? Acqu

## 2021-11-29 NOTE — Patient Instructions (Addendum)
Medication Instructions:  ?No Changes ?*If you need a refill on your cardiac medications before your next appointment, please call your pharmacy* ? ? ?Lab Work: ?No Labs ?If you have labs (blood work) drawn today and your tests are completely normal, you will receive your results only by: ?MyChart Message (if you have MyChart) OR ?A paper copy in the mail ?If you have any lab test that is abnormal or we need to change your treatment, we will call you to review the results. ? ? ?Testing/Procedures: ?ZIO AT Long term monitor-Live Telemetry ? ?Your physician has requested you wear a ZIO patch monitor for 3 days.  ?This is a single patch monitor. [employer] supplies one patch monitor per enrollment. Additional  ?stickers are not available.  ?Please do not apply patch if you will be having a Nuclear Stress Test, Echocardiogram, Cardiac CT, MRI,  ?or Chest Xray during the period you would be wearing the monitor. The patch cannot be worn during  ?these tests. You cannot remove and re-apply the ZIO AT patch monitor.  ?Your ZIO patch monitor will be mailed 3 day USPS to your address on file. It may take 3-5 days to  ?receive your monitor after you have been enrolled.  ?Once you have received your monitor, please review the enclosed instructions. Your monitor has  ?already been registered assigning a specific monitor serial # to you.  ? ?Billing and Patient Assistance Program information ? ?[employer] has been supplied with any insurance information on record for billing. ?[employer] offers a sliding scale Patient Assistance Program for patients without insurance, or whose  ?insurance does not completely cover the cost of the ZIO patch monitor. You must apply for the  ?Patient Assistance Program to qualify for the discounted rate. To apply, call [employer] at [phone],  ?select option 4, select option 2 , ask to apply for the Patient Assistance Program, (you can request an  ?interpreter if needed). [employer] will ask your household  income and how many people are in your  ?household. [employer] will quote your out-of-pocket cost based on this information. They will also be able  ?to set up a 12 month interest free payment plan if needed. ? ?Applying the monitor  ? ?Shave hair from upper left chest.  ?Hold the abrader disc by orange tab. Rub the abrader in 40 strokes over left upper chest as indicated in  ?your monitor instructions.  ?Clean area with 4 enclosed alcohol pads. Use all pads to ensure the area is cleaned thoroughly. Let  ?dry.  ?Apply patch as indicated in monitor instructions. Patch will be placed under collarbone on left side of  ?chest with arrow pointing upward.  ?Rub patch adhesive wings for 2 minutes. Remove the white label marked "1". Remove the white label  ?marked "2". Rub patch adhesive wings for 2 additional minutes.  ?While looking in a mirror, press and release button in center of patch. A small green light will flash 3-4  ?times. This will be your only indicator that the monitor has been turned on.  ?Do not shower for the first 24 hours. You may shower after the first 24 hours.  ?Press the button if you feel a symptom. You will hear a small click. Record Date, Time and Symptom in  ?the Patient Log.  ? ?Starting the Gateway ? ?In your kit there is a small plastic box the size of a cellphone. This is Airline pilot. It transmits all your  ?recorded data to [employer]. This box must  always stay within 10 feet of you. Open the box and push the *  ?button. There will be a light that blinks orange and then green a few times. When the light stops  ?blinking, the Gateway is connected to the ZIO patch. ?Call [employer] at [phone] to confirm your monitor is transmitting. ? ?Returning your monitor ? ?Remove your patch and place it inside the Gateway. In the lower half of the Gateway there is a white  ?bag with prepaid postage on it. Place Gateway in bag and seal. Mail package back to [place] as soon as  ?possible. Your physician should  have your final report approximately 7 days after you have mailed back  ?your monitor. ?Call [facility] at [phone] if you have questions regarding your ZIO AT  ?patch monitor. Call them immediately if you see an orange light blinking on your monitor.  ?If your monitor falls off in less than 4 days, contact our Monitor department at [phone]. If your  ?monitor becomes loose or falls off after 4 days call [employer] at [phone] for suggestions on  ?securing your monitor  ? ? ?Follow-Up: ?At [facility], you and your health needs are our priority.  As part of our continuing mission to provide you with exceptional heart care, we have created designated Provider Care Teams.  These Care Teams include your primary Cardiologist (physician) and Advanced Practice Providers (APPs -  Physician Assistants and Nurse Practitioners) who all work together to provide you with the care you need, when you need it. ? ?We recommend signing up for the patient portal called "MyChart".  Sign up information is provided on this After Visit Summary.  MyChart is used to connect with patients for Virtual Visits (Telemedicine).  Patients are able to view lab/test results, encounter notes, upcoming appointments, etc.  Non-urgent messages can be sent to your provider as well.   ?To learn more about what you can do with MyChart, go to [email or web address].   ? ?Your next appointment:   ?Next Available ? ?The format for your next appointment:   ?In Person ? ?Provider:   ?Shelva Majestic, MD   ? ? ?  ?

## 2021-11-29 NOTE — Progress Notes (Unsigned)
Enrolled patient for a 3 day Zio XT monitor to be mailed to patients home  ? ?Dr Claiborne Billings to read ?

## 2021-11-30 DIAGNOSIS — Z515 Encounter for palliative care: Secondary | ICD-10-CM | POA: Insufficient documentation

## 2021-11-30 DIAGNOSIS — R0602 Shortness of breath: Secondary | ICD-10-CM | POA: Insufficient documentation

## 2021-11-30 DIAGNOSIS — F03C3 Unspecified dementia, severe, with mood disturbance: Secondary | ICD-10-CM | POA: Insufficient documentation

## 2021-12-02 DIAGNOSIS — I700 Atherosclerosis of aorta: Secondary | ICD-10-CM | POA: Diagnosis not present

## 2021-12-02 DIAGNOSIS — Z7901 Long term (current) use of anticoagulants: Secondary | ICD-10-CM

## 2021-12-02 DIAGNOSIS — I480 Paroxysmal atrial fibrillation: Secondary | ICD-10-CM | POA: Diagnosis not present

## 2021-12-06 ENCOUNTER — Telehealth: Payer: Self-pay

## 2021-12-06 ENCOUNTER — Ambulatory Visit (INDEPENDENT_AMBULATORY_CARE_PROVIDER_SITE_OTHER): Payer: Medicare HMO

## 2021-12-06 ENCOUNTER — Other Ambulatory Visit: Payer: Self-pay

## 2021-12-06 DIAGNOSIS — Z0000 Encounter for general adult medical examination without abnormal findings: Secondary | ICD-10-CM | POA: Diagnosis not present

## 2021-12-06 NOTE — Telephone Encounter (Signed)
345 pm.  Incoming call from wife.  She advised patient was evaluated last week under Palliative Care and NP was going to have case reviewed for hospice eligibility.  Wife would like to know if patient is appropriate.  She advised a couple of family members have been with [facility] hosp[facility] and they would like to stay with this agency if possible.  She has been contacted by another agency but is uncertain the name at this time.  Advised that I would contact Damaris Hippo, NP to follow up and we would call her back with an answer. ? ?Secure message sent to Damaris Hippo, NP regarding above.  Response pending.  ?

## 2021-12-06 NOTE — Telephone Encounter (Signed)
455 pm.  Damaris Hippo, NP advised that patient is not hospice eligbile at this time.  Phone call made to wife to update her on above.  Further explaination of servcies provided.  She advsied [facility] has also been in touch with her regarding services.  At this time, wife would like to schedule a follow up appointment with NP and will call is patient displays a further decline.  Damaris Hippo, NP updated on above.  ?

## 2021-12-06 NOTE — Patient Instructions (Signed)
[name] , ?Thank you for taking time to come for your Medicare Wellness Visit. I appreciate your ongoing commitment to your health goals. Please review the following plan we discussed and let me know if I can assist you in the future.  ? ?Screening recommendations/referrals: ?Colonoscopy: no longer required  ?Recommended yearly ophthalmology/optometry visit for glaucoma screening and checkup ?Recommended yearly dental visit for hygiene and checkup ? ?Vaccinations: ?Influenza vaccine: Done 08/22/21 repeat every year  ?Pneumococcal vaccine: Up to date ?Tdap vaccine: Done 06/16/15 repeat every 10 years  ?Shingles vaccine: Shingrix discussed. Please contact your pharmacy for coverage information.  ?Covid-19: Completed 4/16, 5/14, 05/23/20 ? ?Advanced directives: Copies in chart  ? ?Conditions/risks identified: none at this time  ? ?Next appointment: Follow up in one year for your annual wellness visit.  ? ?Preventive Care 80 Years and Older, Male ?Preventive care refers to lifestyle choices and visits with your health care provider that can promote health and wellness. ?What does preventive care include? ?A yearly physical exam. This is also called an annual well check. ?Dental exams once or twice a year. ?Routine eye exams. Ask your health care provider how often you should have your eyes checked. ?Personal lifestyle choices, including: ?Daily care of your teeth and gums. ?Regular physical activity. ?Eating a healthy diet. ?Avoiding tobacco and drug use. ?Limiting alcohol use. ?Practicing safe sex. ?Taking low doses of aspirin every day. ?Taking vitamin and mineral supplements as recommended by your health care provider. ?What happens during an annual well check? ?The services and screenings done by your health care provider during your annual well check will depend on your age, overall health, lifestyle risk factors, and family history of disease. ?Counseling  ?Your health care provider may ask you questions about  your: ?Alcohol use. ?Tobacco use. ?Drug use. ?Emotional well-being. ?Home and relationship well-being. ?Sexual activity. ?Eating habits. ?History of falls. ?Memory and ability to understand (cognition). ?Work and work Statistician. ?Screening  ?You may have the following tests or measurements: ?Height, weight, and BMI. ?Blood pressure. ?Lipid and cholesterol levels. These may be checked every 5 years, or more frequently if you are over 61 years old. ?Skin check. ?Lung cancer screening. You may have this screening every year starting at age 61 if you have a 30-pack-year history of smoking and currently smoke or have quit within the past 15 years. ?Fecal occult blood test (FOBT) of the stool. You may have this test every year starting at age 22. ?Flexible sigmoidoscopy or colonoscopy. You may have a sigmoidoscopy every 5 years or a colonoscopy every 10 years starting at age 31. ?Prostate cancer screening. Recommendations will vary depending on your family history and other risks. ?Hepatitis C blood test. ?Hepatitis B blood test. ?Sexually transmitted disease (STD) testing. ?Diabetes screening. This is done by checking your blood sugar (glucose) after you have not eaten for a while (fasting). You may have this done every 1-3 years. ?Abdominal aortic aneurysm (AAA) screening. You may need this if you are a current or former smoker. ?Osteoporosis. You may be screened starting at age 71 if you are at high risk. ?Talk with your health care provider about your test results, treatment options, and if necessary, the need for more tests. ?Vaccines  ?Your health care provider may recommend certain vaccines, such as: ?Influenza vaccine. This is recommended every year. ?Tetanus, diphtheria, and acellular pertussis (Tdap, Td) vaccine. You may need a Td booster every 10 years. ?Zoster vaccine. You may need this after age 33. ?Pneumococcal 13-valent  conjugate (PCV13) vaccine. One dose is recommended after age 43. ?Pneumococcal  polysaccharide (PPSV23) vaccine. One dose is recommended after age 32. ?Talk to your health care provider about which screenings and vaccines you need and how often you need them. ?This information is not intended to replace advice given to you by your health care provider. Make sure you discuss any questions you have with your health care provider. ?Document Released: 09/23/2015 Document Revised: 05/16/2016 Document Reviewed: 06/28/2015 ?Elsevier Interactive Patient Education ? 2017 [place]. ? ?Fall Prevention in the Home ?Falls can cause injuries. They can happen to people of all ages. There are many things you can do to make your home safe and to help prevent falls. ?What can I do on the outside of my home? ?Regularly fix the edges of walkways and driveways and fix any cracks. ?Remove anything that might make you trip as you walk through a door, such as a raised step or threshold. ?Trim any bushes or trees on the path to your home. ?Use bright outdoor lighting. ?Clear any walking paths of anything that might make someone trip, such as rocks or tools. ?Regularly check to see if handrails are loose or broken. Make sure that both sides of any steps have handrails. ?Any raised decks and porches should have guardrails on the edges. ?Have any leaves, snow, or ice cleared regularly. ?Use sand or salt on walking paths during winter. ?Clean up any spills in your garage right away. This includes oil or grease spills. ?What can I do in the bathroom? ?Use night lights. ?Install grab bars by the toilet and in the tub and shower. Do not use towel bars as grab bars. ?Use non-skid mats or decals in the tub or shower. ?If you need to sit down in the shower, use a plastic, non-slip stool. ?Keep the floor dry. Clean up any water that spills on the floor as soon as it happens. ?Remove soap buildup in the tub or shower regularly. ?Attach bath mats securely with double-sided non-slip rug tape. ?Do not have throw rugs and other  things on the floor that can make you trip. ?What can I do in the bedroom? ?Use night lights. ?Make sure that you have a light by your bed that is easy to reach. ?Do not use any sheets or blankets that are too big for your bed. They should not hang down onto the floor. ?Have a firm chair that has side arms. You can use this for support while you get dressed. ?Do not have throw rugs and other things on the floor that can make you trip. ?What can I do in the kitchen? ?Clean up any spills right away. ?Avoid walking on wet floors. ?Keep items that you use a lot in easy-to-reach places. ?If you need to reach something above you, use a strong step stool that has a grab bar. ?Keep electrical cords out of the way. ?Do not use floor polish or wax that makes floors slippery. If you must use wax, use non-skid floor wax. ?Do not have throw rugs and other things on the floor that can make you trip. ?What can I do with my stairs? ?Do not leave any items on the stairs. ?Make sure that there are handrails on both sides of the stairs and use them. Fix handrails that are broken or loose. Make sure that handrails are as long as the stairways. ?Check any carpeting to make sure that it is firmly attached to the stairs. Fix any carpet that  is loose or worn. ?Avoid having throw rugs at the top or bottom of the stairs. If you do have throw rugs, attach them to the floor with carpet tape. ?Make sure that you have a light switch at the top of the stairs and the bottom of the stairs. If you do not have them, ask someone to add them for you. ?What else can I do to help prevent falls? ?Wear shoes that: ?Do not have high heels. ?Have rubber bottoms. ?Are comfortable and fit you well. ?Are closed at the toe. Do not wear sandals. ?If you use a stepladder: ?Make sure that it is fully opened. Do not climb a closed stepladder. ?Make sure that both sides of the stepladder are locked into place. ?Ask someone to hold it for you, if possible. ?Clearly  mark and make sure that you can see: ?Any grab bars or handrails. ?First and last steps. ?Where the edge of each step is. ?Use tools that help you move around (mobility aids) if they are needed. These include: ?Can

## 2021-12-06 NOTE — Progress Notes (Signed)
Virtual Visit via Telephone Note ? ?I connected with  [name]. on 12/06/21 at  1:00 PM EDT by telephone and verified that I am speaking with the correct person using two identifiers. ? ?Medicare Annual Wellness visit completed telephonically due to Covid-19 pandemic.  ? ?Persons participating in this call: This Health Coach and this patient and Wife [name] ? ?Location: ?Patient: Home ?Provider: Office ?  ?I discussed the limitations, risks, security and privacy concerns of performing an evaluation and management service by telephone and the availability of in person appointments. The patient expressed understanding and agreed to proceed. ? ?Unable to perform video visit due to video visit attempted and failed and/or patient does not have video capability.  ? ?Some vital signs may be absent or patient reported.  ? ?Willette Brace, LPN ? ? ?Subjective:  ? [name]. is a 71 y.o. male who presents for an Initial Medicare Annual Wellness Visit. ? ?Review of Systems    ? ?Cardiac Risk Factors include: advanced age (>27mn, >>96women);sedentary lifestyle;obesity (BMI >30kg/m2);dyslipidemia;male gender ? ?   ?Objective:  ?  ?There were no vitals filed for this visit. ?There is no height or weight on file to calculate BMI. ? ? ?  12/06/2021  ?  1:17 PM 11/24/2021  ?  2:35 PM 11/14/2021  ?  3:52 PM 06/27/2021  ?  3:11 PM 03/14/2021  ? 10:46 AM 03/12/2021  ?  6:45 PM 01/11/2021  ? 12:09 PM  ?Advanced Directives  ?Does Patient Have a Medical Advance Directive? Yes Yes Unable to assess, patient is non-responsive or altered mental status Yes No No Yes  ?Type of [place] D[place] (pink MOST or yellow form)  [place]   [place]  ?Does patient want to make changes to medical advance directive?       No - Patient declined  ?Copy of [place]? Yes - validated  most recent copy scanned in chart (See row information)      No - copy requested  ?Would patient like information on creating a medical advance directive?     No - Patient declined No - Patient declined   ? ? ?Current Medications (verified) ?Outpatient Encounter Medications as of 12/06/2021  ?Medication Sig  ? apixaban (ELIQUIS) 5 MG TABS tablet Take 1 tablet (5 mg total) by mouth 2 (two) times daily.  ? atorvastatin (LIPITOR) 20 MG tablet Take 20 mg by mouth daily.  ? azaTHIOprine (IMURAN) 50 MG tablet Take 200 mg by mouth every morning.  ? divalproex (DEPAKOTE SPRINKLE) 125 MG capsule Take 6 capsules (750 mg total) by mouth every 12 (twelve) hours. (Patient taking differently: Take 500 mg by mouth every 12 (twelve) hours.)  ? docusate sodium (COLACE) 100 MG capsule Take 1 capsule (100 mg total) by mouth 2 (two) times daily.  ? DULoxetine (CYMBALTA) 60 MG capsule Take 1 capsule (60 mg total) by mouth daily.  ? furosemide (LASIX) 40 MG tablet Take 1 tablet (40 mg total) by mouth daily as needed.  ? hydrOXYzine (ATARAX) 10 MG tablet Take 10 mg by mouth 2 (two) times daily as needed.  ? levocetirizine (XYZAL) 5 MG tablet Take 5 mg by mouth every evening.  ? metoprolol succinate (TOPROL-XL) 25 MG 24 hr tablet TAKE 1 TABLET BY MOUTH DAILY. TAKE WITH OR IMMEDIATELY FOLLOWING A MEAL.  ? OLANZapine (ZYPREXA)  5 MG tablet Take by mouth.  ? potassium chloride (KLOR-CON M10) 10 MEQ tablet Take 1 tablet (10 mEq total) by mouth daily.  ? tamsulosin (FLOMAX) 0.4 MG CAPS capsule Take 1 capsule (0.4 mg total) by mouth daily.  ? traZODone (DESYREL) 100 MG tablet Take 100 mg by mouth at bedtime as needed. for sleep  ? levothyroxine (SYNTHROID) 50 MCG tablet Take 1 tablet (50 mcg total) by mouth daily before breakfast.  ? [DISCONTINUED] benzonatate (TESSALON) 100 MG capsule Take by mouth 3 (three) times daily as needed for cough.  ? ?No facility-administered encounter medications on file as of 12/06/2021.  ? ? ?Allergies  (verified) ?Losartan potassium-hctz, Nsaids, Zoloft [sertraline hcl], and Benzodiazepines  ? ?History: ?Past Medical History:  ?Diagnosis Date  ? Acute delirium 07/14/2020  ? Adenomatous colon polyp 2016  ? Allergy   ? AMS (altered mental status) 07/13/2020  ? Anxiety   ? Bell palsy   ? resolved  ? Bell's palsy 09/14/2020  ? CHF (congestive heart failure) ([place])   ? Chicken pox   ? Concussion with no loss of consciousness 07/07/2018  ? COVID-19 04/04/2021  ? Depression   ? Diet-controlled diabetes mellitus ([place])   ? Eating disorder   ? Essential hypertension 11/29/2017  ? Gastro-esophageal reflux disease without esophagitis 09/14/2020  ? GERD (gastroesophageal reflux disease)   ? History of vitamin D deficiency   ? Hyperlipidemia   ? Hypertension   ? Hypokalemia   ? Memory loss   ? MI (myocardial infarction) ([place])   ? per pt   ? Neoplasm of uncertain behavior of skin 09/14/2020  ? Personal history of colonic polyps 09/14/2020  ? Ulcerative colitis ([place]) 2005-2006  ? severe   ? ?Past Surgical History:  ?Procedure Laterality Date  ? COLONOSCOPY WITH PROPOFOL N/A 07/15/2014  ? Procedure: COLONOSCOPY WITH PROPOFOL;  Surgeon: [name], MD;  Location: [facility];  Service: Endoscopy;  Laterality: N/A;  ? COLONOSCOPY WITH PROPOFOL N/A 08/22/2015  ? Procedure: COLONOSCOPY WITH PROPOFOL;  Surgeon: [name], MD;  Location: [facility];  Service: Endoscopy;  Laterality: N/A;  ? ORIF ANKLE FRACTURE  02/27/2012  ? Procedure: OPEN REDUCTION INTERNAL FIXATION (ORIF) ANKLE FRACTURE;  Surgeon: Colin Rhein, MD;  Location: [facility];  Service: Orthopedics;  Laterality: Left;  ORIF left bimalleolar ankle fracture  ? SHOULDER SURGERY    ? LEFT  ? SIGMOIDOSCOPY  10/08/2018  ? Externa and Internal Hmorrhoids. Tubular and Tublovillous adenoma removed from transverse colon. Inflammatory pseudopolyps, negative for dysplasia  ? TONSILLECTOMY    ? UPPER GASTROINTESTINAL ENDOSCOPY    ? ?Family History  ?Problem Relation  Age of Onset  ? Dementia Mother   ? Hypertension Mother   ? Early death Father 35  ?     drowning   ? Post-traumatic stress disorder Brother   ? Neuropathy Brother   ? ?Social History  ? ?Socioeconomic History  ? Marital status: Married  ?  Spouse name: [name]  ? Number of children: 2  ? Years of education: 24  ? Highest education level: Some college, no degree  ?Occupational History  ? Occupation: Retired  ?Tobacco Use  ? Smoking status: Former  ?  Packs/day: 1.00  ?  Years: 19.00  ?  Pack years: 19.00  ?  Types: Cigarettes  ?  Quit date: 02/25/1982  ?  Years since quitting: 39.8  ? Smokeless tobacco: Former  ?  Quit date: 62  ?Vaping Use  ?  Vaping Use: Never used  ?Substance and Sexual Activity  ? Alcohol use: Not Currently  ?  Comment: occ  ? Drug use: No  ? Sexual activity: Yes  ?  Partners: Female  ?Other Topics Concern  ? Not on file  ?Social History Narrative  ? Lives at home with wife [name].  ? College-educated. Works in Public house manager.   ? Right handed  ? ?Social Determinants of Health  ? ?Financial Resource Strain: Low Risk   ? Difficulty of Paying Living Expenses: Not hard at all  ?Food Insecurity: No Food Insecurity  ? Worried About Charity fundraiser in the Last Year: Never true  ? Ran Out of Food in the Last Year: Never true  ?Transportation Needs: No Transportation Needs  ? Lack of Transportation (Medical): No  ? Lack of Transportation (Non-Medical): No  ?Physical Activity: Inactive  ? Days of Exercise per Week: 0 days  ? Minutes of Exercise per Session: 0 min  ?Stress: No Stress Concern Present  ? Feeling of Stress : Not at all  ?Social Connections: Moderately Isolated  ? Frequency of Communication with Friends and Family: Never  ? Frequency of Social Gatherings with Friends and Family: More than three times a week  ? Attends Religious Services: Never  ? Active Member of Clubs or Organizations: No  ? Attends Archivist Meetings: Never  ? Marital Status: Married   ? ? ?Tobacco Counseling ?Counseling given: Not Answered ? ? ?Clinical Intake: ? ?Pre-visit preparation completed: Yes ? ?Pain : No/denies pain ? ?  ? ?BMI - recorded: 37.9 ?Nutritional Status: BMI > 30  Obese ?Nutritional Ris

## 2021-12-08 DIAGNOSIS — Z7901 Long term (current) use of anticoagulants: Secondary | ICD-10-CM | POA: Diagnosis not present

## 2021-12-08 DIAGNOSIS — I700 Atherosclerosis of aorta: Secondary | ICD-10-CM | POA: Diagnosis not present

## 2021-12-08 DIAGNOSIS — I480 Paroxysmal atrial fibrillation: Secondary | ICD-10-CM | POA: Diagnosis not present

## 2021-12-12 ENCOUNTER — Telehealth: Payer: Self-pay | Admitting: Physician Assistant

## 2021-12-12 ENCOUNTER — Telehealth: Payer: Self-pay

## 2021-12-12 NOTE — Telephone Encounter (Signed)
Ok to go back to how he was taking it before, pls send in updated Rx, thanks ?

## 2021-12-12 NOTE — Telephone Encounter (Signed)
Patient is off depakote, wife wants to restart immediately patient was found outside wandering. Please advise, wife is concerned, for some reason per her Clarise Cruz advised to slowly taper off. Off depakote 3 weeks  ?

## 2021-12-12 NOTE — Telephone Encounter (Signed)
Wife called to let doctor know patient was to stop taking his depakote, now he is wondering more and leaving.he was found in the road.  ?

## 2021-12-13 ENCOUNTER — Other Ambulatory Visit: Payer: Self-pay

## 2021-12-13 DIAGNOSIS — R251 Tremor, unspecified: Secondary | ICD-10-CM

## 2021-12-13 DIAGNOSIS — G912 (Idiopathic) normal pressure hydrocephalus: Secondary | ICD-10-CM

## 2021-12-13 DIAGNOSIS — G20 Parkinson's disease: Secondary | ICD-10-CM

## 2021-12-13 DIAGNOSIS — G301 Alzheimer's disease with late onset: Secondary | ICD-10-CM

## 2021-12-13 NOTE — Telephone Encounter (Signed)
Wife advised to go back to original prescription. Thanked me for calling.  ?

## 2021-12-14 ENCOUNTER — Other Ambulatory Visit: Payer: Self-pay | Admitting: Family Medicine

## 2022-01-01 ENCOUNTER — Ambulatory Visit: Payer: Medicare HMO | Admitting: Neurology

## 2022-01-01 ENCOUNTER — Ambulatory Visit: Payer: Medicare HMO | Admitting: Physician Assistant

## 2022-01-09 ENCOUNTER — Telehealth: Payer: Self-pay

## 2022-01-09 NOTE — Telephone Encounter (Addendum)
Called patient regarding results. Left message for patient to call office. Letter mail out on 01/09/22----- Message from Warren Lacy, PA-C sent at 12/26/2021 10:08 PM EDT ----- ?The patient was monitored on December 02, 2021 for only 1 hour.  He was in atrial fibrillation with a fairly well controlled ventricular rate throughout the entire recording.  The average heart rate was 86 bpm with minimum 66 bpm and maximum 107 bpm.  There was no significant ectopy ?

## 2022-02-08 DEATH — deceased

## 2022-02-21 ENCOUNTER — Encounter: Payer: Self-pay | Admitting: Family Medicine

## 2022-02-26 ENCOUNTER — Ambulatory Visit: Payer: Medicare HMO | Admitting: Physician Assistant

## 2022-07-07 IMAGING — NM NM DATSCAN
2 series · 24 of 30 positions shown · non-contrast
Comparison: None.

CLINICAL DATA: 70-year-old male tremors and cognitive impairment.

EXAM:
NUCLEAR MEDICINE BRAIN IMAGING WITH SPECT  (DaTscan )
TECHNIQUE: SPECT images of the brain were obtained after intravenous injection
of radiopharmaceutical. 4 hour post injection imaging. Appropriate
positioning. 130 mg MARK PATRICK STAT given orally for thyroid blockade.
RADIOPHARMACEUTICALS:  4.9 millicuries I 123 Ioflupane

[Series 1: dat scan · 4.14mm/px · 5 of 120 frames shown]
[frame 11/120  full-range]
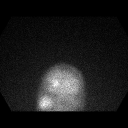
[frame 31/120  full-range]
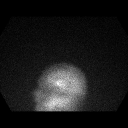
[frame 71/120  full-range]
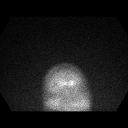
[frame 91/120  full-range]
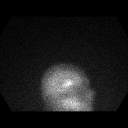
[frame 111/120  full-range]
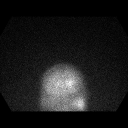

[Series 1015: mpr (id) range · 0.69mm/px · 19 of 31 slices shown]
[im 1/31]
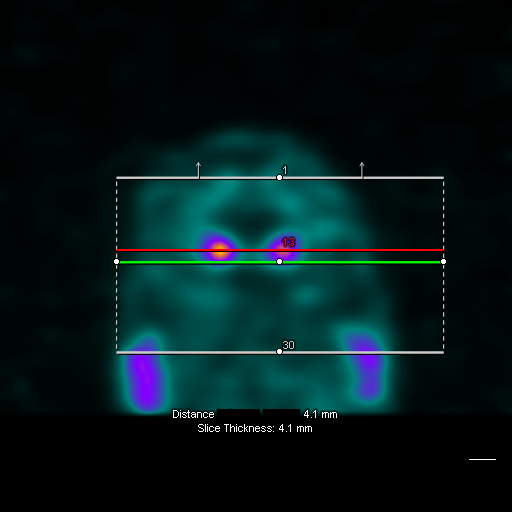
[im 3/31]
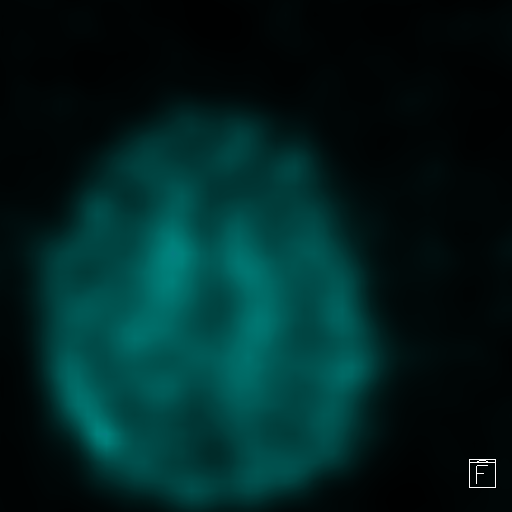
[im 4/31]
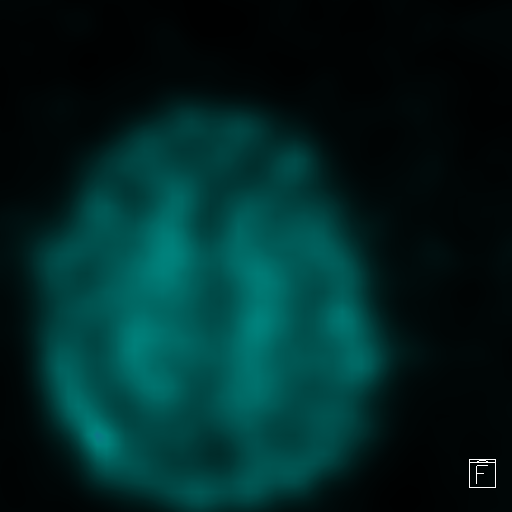
[im 6/31]
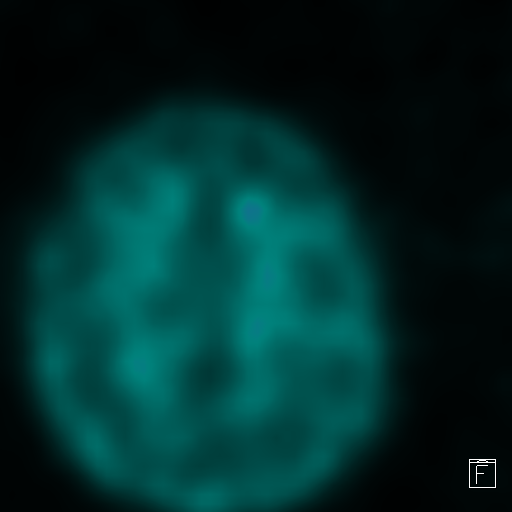
[im 7/31]
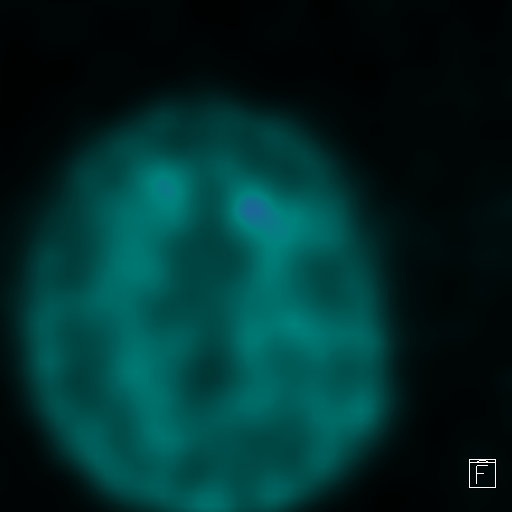
[im 10/31]
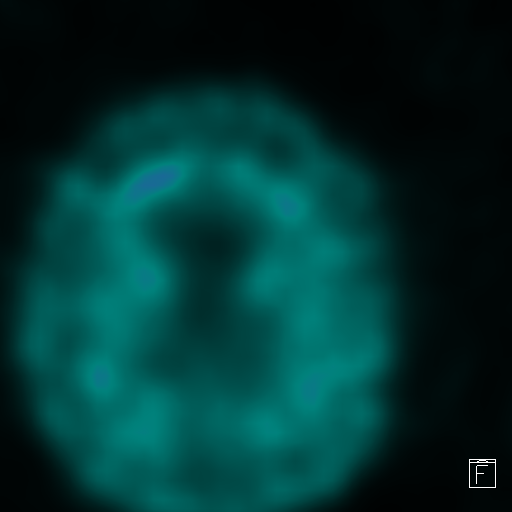
[im 11/31]
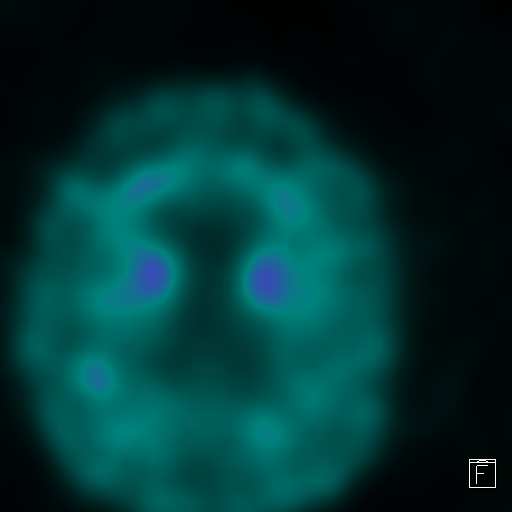
[im 12/31]
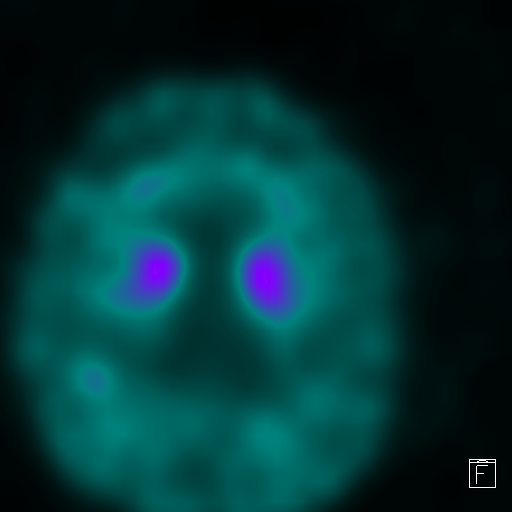
[im 14/31]
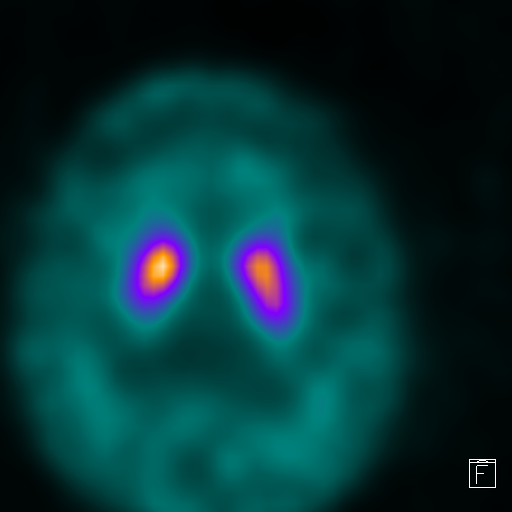
[im 16/31]
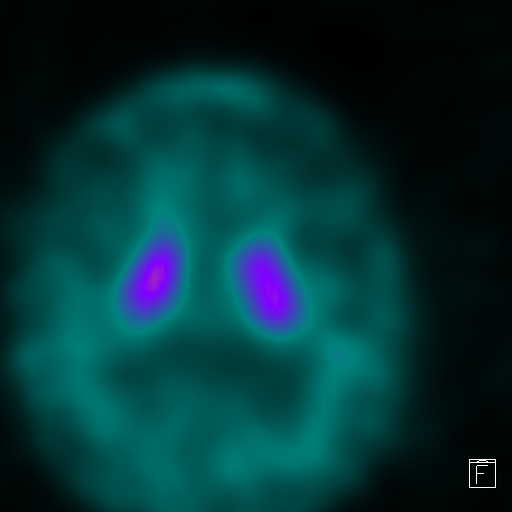
[im 17/31]
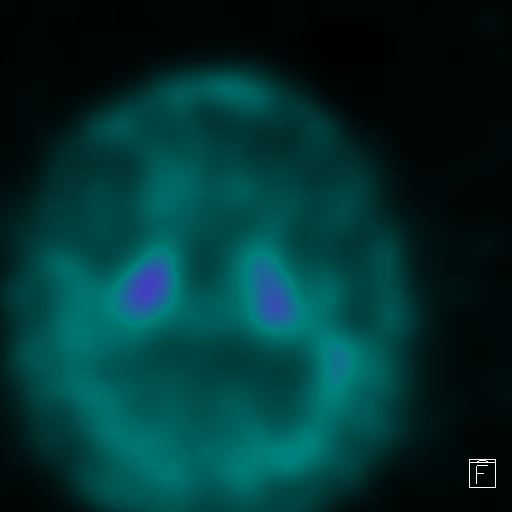
[im 19/31]
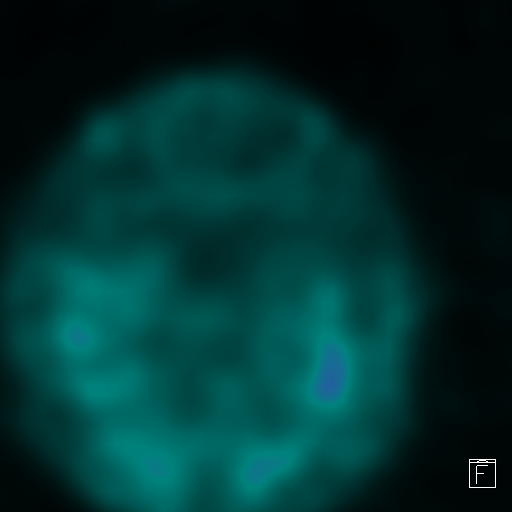
[im 20/31]
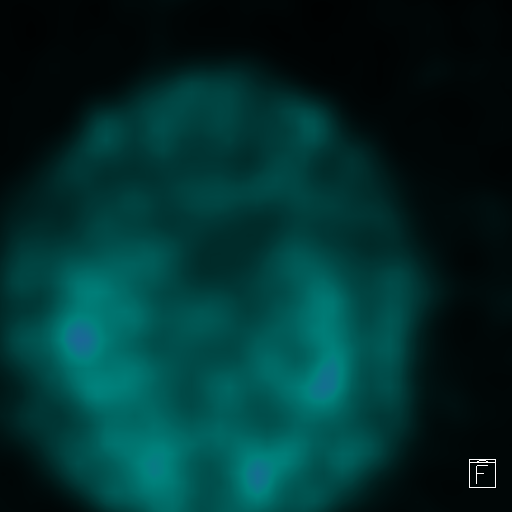
[im 23/31]
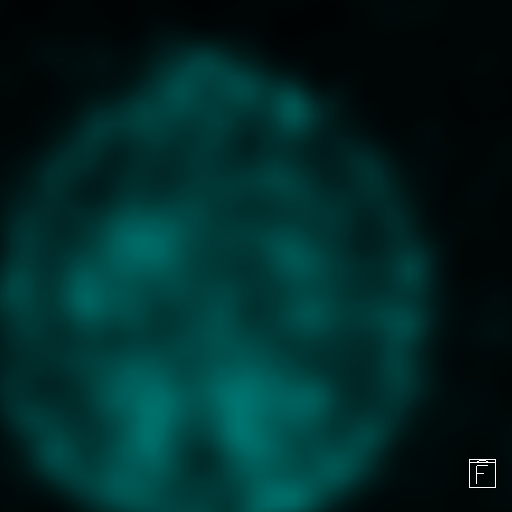
[im 24/31]
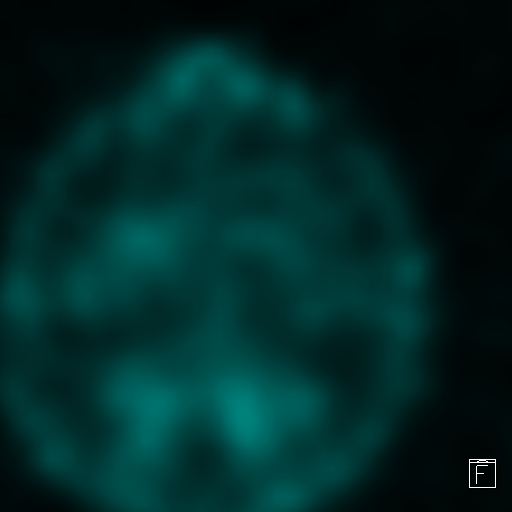
[im 25/31]
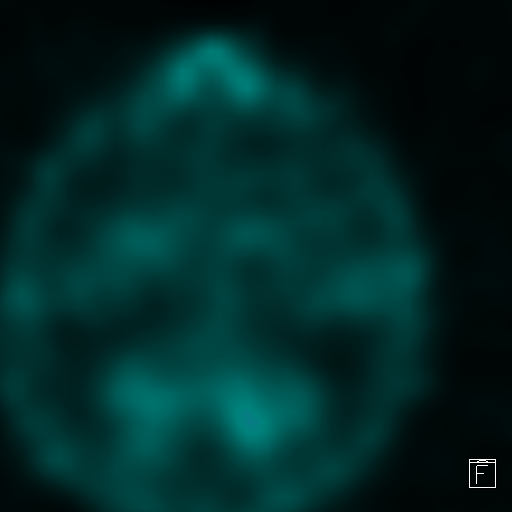
[im 27/31]
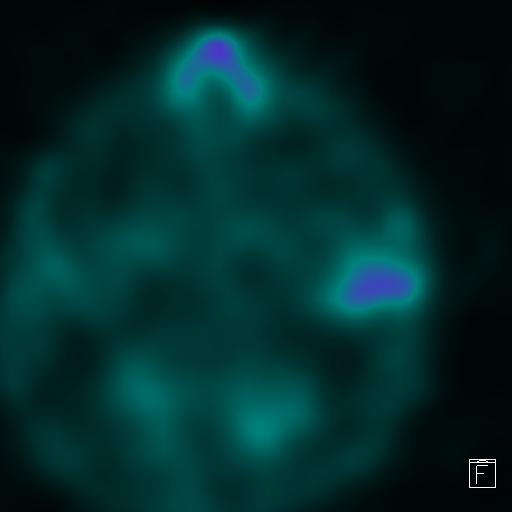
[im 29/31]
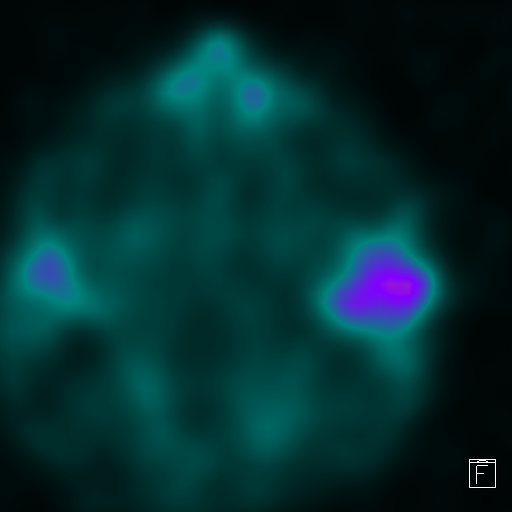
[im 31/31]
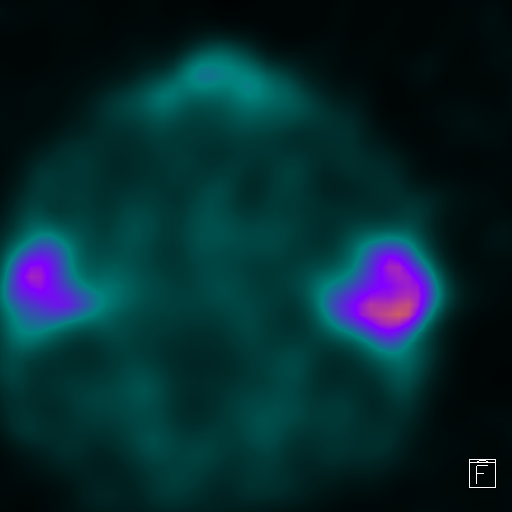

[24 of 30 positions shown; findings below may reference images not displayed]

FINDINGS: Symmetric intense uptake within LEFT and RIGHT striata. The heads of
the caudate nuclei and the posterior striata (putamen) are normal
shape. No evidence of loss of dopamine transport populations in the
basal ganglia.
IMPRESSION: Symmetric activity in normal-shaped striata. No evidence of loss of
dopamine transport populations. Findings are NOT typical of
Parkinson's syndrome pathology.

Of note, DaTSCAN is not diagnostic of Parkinsonian syndromes, which
remains a clinical diagnosis. DaTscan is an adjuvant test to aid in
the clinical diagnosis of Parkinsonian syndromes.
